# Patient Record
Sex: Female | Born: 1960
Health system: Southern US, Community
[De-identification: ages and names within clinical notes are randomized; demographics above are authoritative.]

## PROBLEM LIST (undated history)

## (undated) ENCOUNTER — Ambulatory Visit (HOSPITAL_COMMUNITY): Admission: EM | Payer: 59 | Source: Home / Self Care

## (undated) DIAGNOSIS — R519 Headache, unspecified: Secondary | ICD-10-CM

## (undated) DIAGNOSIS — E785 Hyperlipidemia, unspecified: Secondary | ICD-10-CM

## (undated) DIAGNOSIS — I10 Essential (primary) hypertension: Secondary | ICD-10-CM

## (undated) DIAGNOSIS — D649 Anemia, unspecified: Secondary | ICD-10-CM

## (undated) DIAGNOSIS — K219 Gastro-esophageal reflux disease without esophagitis: Secondary | ICD-10-CM

## (undated) DIAGNOSIS — R51 Headache: Secondary | ICD-10-CM

## (undated) DIAGNOSIS — M722 Plantar fascial fibromatosis: Secondary | ICD-10-CM

## (undated) DIAGNOSIS — M1990 Unspecified osteoarthritis, unspecified site: Secondary | ICD-10-CM

## (undated) DIAGNOSIS — J189 Pneumonia, unspecified organism: Secondary | ICD-10-CM

## (undated) DIAGNOSIS — K449 Diaphragmatic hernia without obstruction or gangrene: Secondary | ICD-10-CM

## (undated) DIAGNOSIS — F40240 Claustrophobia: Secondary | ICD-10-CM

## (undated) DIAGNOSIS — I639 Cerebral infarction, unspecified: Secondary | ICD-10-CM

## (undated) HISTORY — DX: Hyperlipidemia, unspecified: E78.5

## (undated) HISTORY — DX: Unspecified osteoarthritis, unspecified site: M19.90

## (undated) HISTORY — PX: COLONOSCOPY: SHX174

## (undated) HISTORY — DX: Anemia, unspecified: D64.9

---

## 1986-06-26 HISTORY — PX: TUBAL LIGATION: SHX77

## 1997-10-01 ENCOUNTER — Emergency Department (HOSPITAL_COMMUNITY): Admission: EM | Admit: 1997-10-01 | Discharge: 1997-10-01 | Payer: Self-pay | Admitting: Emergency Medicine

## 1997-12-04 ENCOUNTER — Emergency Department (HOSPITAL_COMMUNITY): Admission: EM | Admit: 1997-12-04 | Discharge: 1997-12-04 | Payer: Self-pay | Admitting: Emergency Medicine

## 1999-05-31 ENCOUNTER — Emergency Department (HOSPITAL_COMMUNITY): Admission: EM | Admit: 1999-05-31 | Discharge: 1999-05-31 | Payer: Self-pay | Admitting: Emergency Medicine

## 1999-05-31 ENCOUNTER — Encounter: Payer: Self-pay | Admitting: Emergency Medicine

## 1999-07-18 ENCOUNTER — Emergency Department (HOSPITAL_COMMUNITY): Admission: EM | Admit: 1999-07-18 | Discharge: 1999-07-18 | Payer: Self-pay | Admitting: Emergency Medicine

## 2000-02-23 ENCOUNTER — Encounter: Admission: RE | Admit: 2000-02-23 | Discharge: 2000-02-23 | Payer: Self-pay | Admitting: Hematology and Oncology

## 2000-11-08 ENCOUNTER — Encounter: Admission: RE | Admit: 2000-11-08 | Discharge: 2000-11-08 | Payer: Self-pay | Admitting: Hematology and Oncology

## 2001-01-09 ENCOUNTER — Encounter: Admission: RE | Admit: 2001-01-09 | Discharge: 2001-01-09 | Payer: Self-pay | Admitting: Internal Medicine

## 2001-01-16 ENCOUNTER — Ambulatory Visit (HOSPITAL_COMMUNITY): Admission: RE | Admit: 2001-01-16 | Discharge: 2001-01-16 | Payer: Self-pay

## 2001-05-04 ENCOUNTER — Emergency Department (HOSPITAL_COMMUNITY): Admission: EM | Admit: 2001-05-04 | Discharge: 2001-05-04 | Payer: Self-pay

## 2002-01-17 ENCOUNTER — Inpatient Hospital Stay (HOSPITAL_COMMUNITY): Admission: AD | Admit: 2002-01-17 | Discharge: 2002-01-17 | Payer: Self-pay | Admitting: *Deleted

## 2002-01-17 ENCOUNTER — Encounter: Payer: Self-pay | Admitting: *Deleted

## 2002-01-27 ENCOUNTER — Emergency Department (HOSPITAL_COMMUNITY): Admission: EM | Admit: 2002-01-27 | Discharge: 2002-01-27 | Payer: Self-pay | Admitting: Emergency Medicine

## 2002-05-20 ENCOUNTER — Encounter: Admission: RE | Admit: 2002-05-20 | Discharge: 2002-05-20 | Payer: Self-pay | Admitting: Occupational Medicine

## 2002-05-20 ENCOUNTER — Encounter: Payer: Self-pay | Admitting: Occupational Medicine

## 2002-05-27 ENCOUNTER — Encounter: Admission: RE | Admit: 2002-05-27 | Discharge: 2002-06-09 | Payer: Self-pay | Admitting: Occupational Medicine

## 2003-07-21 ENCOUNTER — Emergency Department (HOSPITAL_COMMUNITY): Admission: EM | Admit: 2003-07-21 | Discharge: 2003-07-21 | Payer: Self-pay | Admitting: Emergency Medicine

## 2003-11-13 ENCOUNTER — Emergency Department (HOSPITAL_COMMUNITY): Admission: EM | Admit: 2003-11-13 | Discharge: 2003-11-13 | Payer: Self-pay | Admitting: Emergency Medicine

## 2003-12-27 ENCOUNTER — Inpatient Hospital Stay (HOSPITAL_COMMUNITY): Admission: AD | Admit: 2003-12-27 | Discharge: 2003-12-27 | Payer: Self-pay | Admitting: Obstetrics & Gynecology

## 2004-01-05 ENCOUNTER — Encounter: Admission: RE | Admit: 2004-01-05 | Discharge: 2004-01-05 | Payer: Self-pay | Admitting: Obstetrics and Gynecology

## 2004-02-27 ENCOUNTER — Emergency Department (HOSPITAL_COMMUNITY): Admission: EM | Admit: 2004-02-27 | Discharge: 2004-02-27 | Payer: Self-pay | Admitting: Emergency Medicine

## 2004-03-07 ENCOUNTER — Emergency Department (HOSPITAL_COMMUNITY): Admission: EM | Admit: 2004-03-07 | Discharge: 2004-03-07 | Payer: Self-pay | Admitting: Emergency Medicine

## 2004-11-05 ENCOUNTER — Inpatient Hospital Stay (HOSPITAL_COMMUNITY): Admission: AD | Admit: 2004-11-05 | Discharge: 2004-11-05 | Payer: Self-pay | Admitting: Obstetrics and Gynecology

## 2005-03-30 ENCOUNTER — Emergency Department (HOSPITAL_COMMUNITY): Admission: EM | Admit: 2005-03-30 | Discharge: 2005-03-30 | Payer: Self-pay | Admitting: Emergency Medicine

## 2006-07-14 ENCOUNTER — Emergency Department (HOSPITAL_COMMUNITY): Admission: EM | Admit: 2006-07-14 | Discharge: 2006-07-14 | Payer: Self-pay | Admitting: Emergency Medicine

## 2006-10-01 ENCOUNTER — Emergency Department (HOSPITAL_COMMUNITY): Admission: EM | Admit: 2006-10-01 | Discharge: 2006-10-01 | Payer: Self-pay | Admitting: *Deleted

## 2006-10-02 ENCOUNTER — Ambulatory Visit: Payer: Self-pay | Admitting: *Deleted

## 2006-10-29 ENCOUNTER — Emergency Department (HOSPITAL_COMMUNITY): Admission: EM | Admit: 2006-10-29 | Discharge: 2006-10-29 | Payer: Self-pay | Admitting: Emergency Medicine

## 2007-04-18 ENCOUNTER — Encounter (INDEPENDENT_AMBULATORY_CARE_PROVIDER_SITE_OTHER): Payer: Self-pay | Admitting: Internal Medicine

## 2007-04-18 ENCOUNTER — Observation Stay (HOSPITAL_COMMUNITY): Admission: EM | Admit: 2007-04-18 | Discharge: 2007-04-20 | Payer: Self-pay | Admitting: Emergency Medicine

## 2007-04-18 ENCOUNTER — Ambulatory Visit: Payer: Self-pay | Admitting: Internal Medicine

## 2007-05-28 ENCOUNTER — Ambulatory Visit: Payer: Self-pay | Admitting: Internal Medicine

## 2007-05-28 DIAGNOSIS — F17200 Nicotine dependence, unspecified, uncomplicated: Secondary | ICD-10-CM | POA: Insufficient documentation

## 2007-05-28 DIAGNOSIS — E785 Hyperlipidemia, unspecified: Secondary | ICD-10-CM | POA: Insufficient documentation

## 2007-05-28 DIAGNOSIS — R079 Chest pain, unspecified: Secondary | ICD-10-CM | POA: Insufficient documentation

## 2007-05-28 DIAGNOSIS — K219 Gastro-esophageal reflux disease without esophagitis: Secondary | ICD-10-CM | POA: Insufficient documentation

## 2007-06-21 LAB — HM MAMMOGRAPHY: HM Mammogram: NORMAL

## 2007-06-24 ENCOUNTER — Ambulatory Visit (HOSPITAL_COMMUNITY): Admission: RE | Admit: 2007-06-24 | Discharge: 2007-06-24 | Payer: Self-pay | Admitting: Internal Medicine

## 2007-08-07 ENCOUNTER — Telehealth: Payer: Self-pay | Admitting: *Deleted

## 2007-10-15 ENCOUNTER — Ambulatory Visit: Payer: Self-pay | Admitting: Internal Medicine

## 2007-10-17 ENCOUNTER — Ambulatory Visit: Payer: Self-pay | Admitting: Infectious Disease

## 2008-01-07 ENCOUNTER — Inpatient Hospital Stay (HOSPITAL_COMMUNITY): Admission: AD | Admit: 2008-01-07 | Discharge: 2008-01-07 | Payer: Self-pay | Admitting: Gynecology

## 2008-01-30 ENCOUNTER — Ambulatory Visit: Payer: Self-pay | Admitting: Obstetrics and Gynecology

## 2008-01-30 ENCOUNTER — Encounter: Payer: Self-pay | Admitting: Obstetrics and Gynecology

## 2008-02-18 ENCOUNTER — Emergency Department (HOSPITAL_COMMUNITY): Admission: EM | Admit: 2008-02-18 | Discharge: 2008-02-18 | Payer: Self-pay | Admitting: Emergency Medicine

## 2008-02-18 ENCOUNTER — Emergency Department (HOSPITAL_COMMUNITY): Admission: EM | Admit: 2008-02-18 | Discharge: 2008-02-18 | Payer: Self-pay | Admitting: Family Medicine

## 2008-04-16 ENCOUNTER — Encounter: Payer: Self-pay | Admitting: Obstetrics and Gynecology

## 2008-04-16 ENCOUNTER — Ambulatory Visit: Payer: Self-pay | Admitting: Obstetrics & Gynecology

## 2008-04-16 ENCOUNTER — Other Ambulatory Visit: Admission: RE | Admit: 2008-04-16 | Discharge: 2008-04-16 | Payer: Self-pay | Admitting: Obstetrics & Gynecology

## 2008-04-16 LAB — CONVERTED CEMR LAB
MCHC: 30.5 g/dL (ref 30.0–36.0)
Prolactin: 7.1 ng/mL
RDW: 14.1 % (ref 11.5–15.5)
TSH: 0.597 microintl units/mL (ref 0.350–4.50)

## 2008-04-30 ENCOUNTER — Ambulatory Visit: Payer: Self-pay | Admitting: Obstetrics and Gynecology

## 2008-05-15 ENCOUNTER — Inpatient Hospital Stay (HOSPITAL_COMMUNITY): Admission: AD | Admit: 2008-05-15 | Discharge: 2008-05-15 | Payer: Self-pay | Admitting: Family Medicine

## 2008-05-26 HISTORY — PX: ABDOMINAL HYSTERECTOMY: SHX81

## 2008-06-08 ENCOUNTER — Encounter: Payer: Self-pay | Admitting: Obstetrics & Gynecology

## 2008-06-08 ENCOUNTER — Inpatient Hospital Stay (HOSPITAL_COMMUNITY): Admission: RE | Admit: 2008-06-08 | Discharge: 2008-06-10 | Payer: Self-pay | Admitting: Obstetrics & Gynecology

## 2008-06-08 ENCOUNTER — Ambulatory Visit: Payer: Self-pay | Admitting: Obstetrics & Gynecology

## 2008-06-12 ENCOUNTER — Inpatient Hospital Stay (HOSPITAL_COMMUNITY): Admission: AD | Admit: 2008-06-12 | Discharge: 2008-06-12 | Payer: Self-pay | Admitting: Family Medicine

## 2008-06-14 ENCOUNTER — Ambulatory Visit: Payer: Self-pay | Admitting: Obstetrics & Gynecology

## 2008-06-14 ENCOUNTER — Inpatient Hospital Stay (HOSPITAL_COMMUNITY): Admission: AD | Admit: 2008-06-14 | Discharge: 2008-06-14 | Payer: Self-pay | Admitting: Obstetrics & Gynecology

## 2008-07-02 ENCOUNTER — Ambulatory Visit: Payer: Self-pay | Admitting: Obstetrics and Gynecology

## 2008-07-23 ENCOUNTER — Ambulatory Visit: Payer: Self-pay | Admitting: Family Medicine

## 2008-09-01 ENCOUNTER — Ambulatory Visit: Payer: Self-pay | Admitting: Internal Medicine

## 2008-09-01 ENCOUNTER — Encounter (INDEPENDENT_AMBULATORY_CARE_PROVIDER_SITE_OTHER): Payer: Self-pay | Admitting: Internal Medicine

## 2009-06-06 ENCOUNTER — Emergency Department (HOSPITAL_COMMUNITY): Admission: EM | Admit: 2009-06-06 | Discharge: 2009-06-06 | Payer: Self-pay | Admitting: Emergency Medicine

## 2009-07-30 ENCOUNTER — Ambulatory Visit: Payer: Self-pay | Admitting: Internal Medicine

## 2009-07-31 ENCOUNTER — Emergency Department (HOSPITAL_COMMUNITY): Admission: EM | Admit: 2009-07-31 | Discharge: 2009-07-31 | Payer: Self-pay | Admitting: Emergency Medicine

## 2009-08-04 LAB — CONVERTED CEMR LAB
Barbiturate Quant, Ur: NEGATIVE
Creatinine,U: 118.9 mg/dL
Methadone: NEGATIVE
Opiates: POSITIVE — AB
Propoxyphene: NEGATIVE

## 2009-11-30 ENCOUNTER — Ambulatory Visit (HOSPITAL_COMMUNITY): Admission: RE | Admit: 2009-11-30 | Discharge: 2009-11-30 | Payer: Self-pay | Admitting: Internal Medicine

## 2009-11-30 ENCOUNTER — Ambulatory Visit: Payer: Self-pay | Admitting: Internal Medicine

## 2009-12-06 ENCOUNTER — Emergency Department (HOSPITAL_COMMUNITY): Admission: EM | Admit: 2009-12-06 | Discharge: 2009-12-06 | Payer: Self-pay | Admitting: Emergency Medicine

## 2009-12-17 ENCOUNTER — Emergency Department (HOSPITAL_COMMUNITY): Admission: EM | Admit: 2009-12-17 | Discharge: 2009-12-17 | Payer: Self-pay | Admitting: Emergency Medicine

## 2010-01-10 ENCOUNTER — Ambulatory Visit (HOSPITAL_COMMUNITY): Admission: RE | Admit: 2010-01-10 | Discharge: 2010-01-10 | Payer: Self-pay | Admitting: Internal Medicine

## 2010-01-10 ENCOUNTER — Ambulatory Visit: Payer: Self-pay | Admitting: Internal Medicine

## 2010-01-10 ENCOUNTER — Encounter: Payer: Self-pay | Admitting: Internal Medicine

## 2010-01-10 ENCOUNTER — Telehealth (INDEPENDENT_AMBULATORY_CARE_PROVIDER_SITE_OTHER): Payer: Self-pay | Admitting: *Deleted

## 2010-01-10 DIAGNOSIS — R209 Unspecified disturbances of skin sensation: Secondary | ICD-10-CM | POA: Insufficient documentation

## 2010-01-10 DIAGNOSIS — R7309 Other abnormal glucose: Secondary | ICD-10-CM | POA: Insufficient documentation

## 2010-01-11 ENCOUNTER — Encounter: Payer: Self-pay | Admitting: Internal Medicine

## 2010-01-11 ENCOUNTER — Telehealth: Payer: Self-pay | Admitting: *Deleted

## 2010-01-11 ENCOUNTER — Telehealth: Payer: Self-pay | Admitting: Licensed Clinical Social Worker

## 2010-01-11 DIAGNOSIS — D649 Anemia, unspecified: Secondary | ICD-10-CM

## 2010-01-11 DIAGNOSIS — D509 Iron deficiency anemia, unspecified: Secondary | ICD-10-CM | POA: Insufficient documentation

## 2010-01-11 LAB — CONVERTED CEMR LAB
ALT: 24 units/L (ref 0–35)
AST: 20 units/L (ref 0–37)
Alkaline Phosphatase: 55 units/L (ref 39–117)
CO2: 24 meq/L (ref 19–32)
Creatinine, Ser: 0.68 mg/dL (ref 0.40–1.20)
HCT: 33.8 % — ABNORMAL LOW (ref 36.0–46.0)
MCV: 74 fL — ABNORMAL LOW (ref >=78.0)
Platelets: 272 10*3/uL (ref 150–400)
Total Bilirubin: 0.2 mg/dL — ABNORMAL LOW (ref 0.3–1.2)
Total CHOL/HDL Ratio: 4.1
WBC: 5.3 10*3/uL (ref 4.0–10.5)

## 2010-01-12 ENCOUNTER — Encounter: Payer: Self-pay | Admitting: Licensed Clinical Social Worker

## 2010-01-24 ENCOUNTER — Ambulatory Visit: Payer: Self-pay | Admitting: Internal Medicine

## 2010-01-24 DIAGNOSIS — I10 Essential (primary) hypertension: Secondary | ICD-10-CM | POA: Insufficient documentation

## 2010-01-24 DIAGNOSIS — J45901 Unspecified asthma with (acute) exacerbation: Secondary | ICD-10-CM | POA: Insufficient documentation

## 2010-02-01 ENCOUNTER — Telehealth (INDEPENDENT_AMBULATORY_CARE_PROVIDER_SITE_OTHER): Payer: Self-pay | Admitting: *Deleted

## 2010-02-04 ENCOUNTER — Encounter: Payer: Self-pay | Admitting: Internal Medicine

## 2010-02-04 ENCOUNTER — Ambulatory Visit: Payer: Self-pay | Admitting: Internal Medicine

## 2010-02-04 DIAGNOSIS — K921 Melena: Secondary | ICD-10-CM | POA: Insufficient documentation

## 2010-02-04 LAB — CONVERTED CEMR LAB
OCCULT 1: NEGATIVE
OCCULT 2: POSITIVE

## 2010-02-15 ENCOUNTER — Telehealth: Payer: Self-pay | Admitting: Internal Medicine

## 2010-02-16 ENCOUNTER — Telehealth: Payer: Self-pay | Admitting: Internal Medicine

## 2010-05-09 ENCOUNTER — Ambulatory Visit: Payer: Self-pay | Admitting: Internal Medicine

## 2010-07-28 NOTE — Progress Notes (Signed)
Summary: Soc. 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 Work Call placed to: Patient Summary of Call: Called patient re: smoking cessation counseling and she will come in tomorrow for TB test reading and also to see me for smoking cessation.

## 2010-07-28 NOTE — Assessment & Plan Note (Signed)
Summary: SHE THINK HAS COLD. SB   Vital Signs:  Patient profile:   50 year old female Height:      59 inches Weight:      141.0 pounds BMI:     28.58 Temp:     97.2 degrees F oral Pulse rate:   69 / minute BP sitting:   128 / 85  (right arm)  Vitals Entered By: Filomena Jungling NT II (November 30, 2009 8:35 AM) CC:  tooth pain, ?bronchitis Is Patient Diabetic? No Pain Assessment Patient in pain? yes     Location: teeth Intensity: 8 Type: aching Onset of pain  Intermittent Nutritional Status BMI of 25 - 29 = overweight  Have you ever been in a relationship where you felt threatened, hurt or afraid?No   Does patient need assistance? Functional Status Self care Ambulation Normal   Primary Care Provider:  Peggye Pitt, MD  CC:   tooth pain and ?bronchitis.  History of Present Illness: [name] is a 50 year old Female with PMH/problems as outlined in the EMR, who presents to the [facility] with chief complaint(s) of:    1. Cough: having cough and wheezing, this is going on for two to three weeks. No fever. Brings up mucoid phlegm, started out being little yellowish and now it is white. No sick contact. No pain, did have some burning in the chest. Tried some over the counter stuffs that did not help. Cough is worse during night. But heat makes it worse. Did have asthma during childhood, sometimes feels like asthma.   Preventive Screening-Counseling & Management  Alcohol-Tobacco     Alcohol type: AT TIMES / BEER     Smoking Status: current     Year Started: AT THE AGE OF 19  Caffeine-Diet-Exercise     Does Patient Exercise: yes     Type of exercise: WALKING     Times/week: 3  Current Medications (verified): 1)  Gnp Omeprazole 20 Mg  Tbec (Omeprazole) .... Take 1 Tablet By Mouth Once A Day 2)  Pravastatin Sodium 40 Mg  Tabs (Pravastatin Sodium) .... Take 1 Tablet By Mouth Once A Day 3)  Vicodin 5-500 Mg Tabs (Hydrocodone-Acetaminophen) .... Take 1 Tablet By Mouth Two Times  A Day 4)  Peridex 0.12 % Soln (Chlorhexidine Gluconate) .Marland KitchenMarland KitchenMarland Kitchen 15ml Swish and Spit Two Times A Day 5)  Ventolin Hfa 108 (90 Base) Mcg/act Aers (Albuterol Sulfate) .... Use Two Puffs Four Times A Day As Needed For Wheezing.  Allergies (verified): No Known Drug Allergies  Past History:  Past Medical History: Last updated: 05/28/2007 Hyperlipidemia  Risk Factors: Exercise: yes (11/30/2009)  Risk Factors: Smoking Status: current (11/30/2009)  Review of Systems      See HPI  Physical Exam  General:  alert and well-developed.   Head:  normocephalic and atraumatic.   Eyes:  vision grossly intact, pupils equal, and pupils round.   Ears:  no external deformities.   Nose:  no external deformity.   Mouth:  pharynx pink and moist and no erythema.   Neck:  supple.   Lungs:  normal respiratory effort and normal breath sounds.   Heart:  normal rate and regular rhythm.   Abdomen:  soft and non-tender.   Pulses:  normal peripheral pulses  Extremities:  no cyanosis, clubbing or edema  Neurologic:  non focal.  Psych:  normally interactive.     Impression & Recommendations:  Problem # 1:  COUGH (ICD-786.2) Differential incl seasonal allergy, asthma, GERD, postnasal  drip, smoker's cough. I will get a chest x-ray to look for anything wrong in the lungs eg. malignancy given h/o smoking. Will give her albuterol inhaler to be used as needed.   Orders: CXR- 2view (CXR)  Problem # 2:  TOBACCO ABUSE (ICD-305.1) Counselled.   Complete Medication List: 1)  Gnp Omeprazole 20 Mg Tbec (Omeprazole) .... Take 1 tablet by mouth once a day 2)  Pravastatin Sodium 40 Mg Tabs (Pravastatin sodium) .... Take 1 tablet by mouth once a day 3)  Vicodin 5-500 Mg Tabs (Hydrocodone-acetaminophen) .... Take 1 tablet by mouth two times a day 4)  Peridex 0.12 % Soln (Chlorhexidine gluconate) .Marland KitchenMarland KitchenMarland Kitchen 15ml swish and spit two times a day 5)  Ventolin Hfa 108 (90 Base) Mcg/act Aers (Albuterol sulfate) .... Use two puffs  four times a day as needed for wheezing.  Patient Instructions: 1)  Please schedule a follow-up appointment in 1 month. 2)  We will let you know if anything wrong with your lab work.   3)  Please let us know or come to ED if breathing gets worse.  Prescriptions: VENTOLIN HFA 108 (90 BASE) MCG/ACT AERS (ALBUTEROL SULFATE) Use two puffs four times a day as needed for wheezing.  #1 x 1   Entered and Authorized by:   Zara Council MD   Signed by:   Zara Council MD on 11/30/2009   Method used:   Print then Give to Patient   RxID:   [id number]   Prevention & Chronic Care Immunizations   Influenza vaccine: Not documented   Influenza vaccine deferral: Deferred  (11/30/2009)    Tetanus booster: Not documented   Td booster deferral: Deferred  (11/30/2009)    Pneumococcal vaccine: Not documented  Other Screening   Pap smear: Not documented    Mammogram: normal  (06/21/2007)   Mammogram due: 06/2008   Smoking status: current  (11/30/2009)  Lipids   Total Cholesterol: Not documented   LDL: Not documented   LDL Direct: Not documented   HDL: Not documented   Triglycerides: Not documented    SGOT (AST): Not documented   SGPT (ALT): Not documented   Alkaline phosphatase: Not documented   Total bilirubin: Not documented  Self-Management Support :    Patient will work on the following items until the next clinic visit to reach self-care goals:     Eating: eat more vegetables, use fresh or frozen vegetables, eat foods that are low in salt, eat baked foods instead of fried foods, eat fruit for snacks and desserts  (11/30/2009)    Lipid self-management support: Education handout, Resources for patients handout  (11/30/2009)     Lipid education handout printed      Resource handout printed.

## 2010-07-28 NOTE — Progress Notes (Signed)
Summary: phone/gg  Phone Note Call from Patient   Caller: Patient Summary of Call: Pt called with c/o  tingling on left side of neck,  arm , shoulder and leg.  onset of complete left side is today,   this tingling iis constant.  She was been seen here for tingling in arm and told it was a nerve impingment.  Today left  palm is  sweaty and this concerns her. She feels something is wrong with her.  She had nausea and headache this am.   Pt # [phone] Initial call taken by: Merrie Roof RN,  February 01, 2010 9:22 AM  Follow-up for Phone Call        Pt advised to be evaluated in ED as tingling on complete left side is new per Dr.Woodyear  Merrie Roof RN  February 01, 2010 10:06 AM  Follow-up by: Zoila Shutter MD,  February 01, 2010 9:46 AM

## 2010-07-28 NOTE — Progress Notes (Signed)
Summary: phone note/gp  Phone Note Call from Patient   Caller: Patient Summary of Call: Pt states she cannot afford Omeprazole 20mg - costs $120.00; pt. instructed to buy OTC.  Also instructed to call back if she has any other problems. Initial call taken by: Chinita Pester RN,  January 11, 2010 11:03 AM     Appended Document: phone note/gp I asked the patient to go to [facility] for $15 for 90 days supply.

## 2010-07-28 NOTE — Progress Notes (Signed)
Summary: Stiffness in neck  Phone Note Call from Patient   Caller: Patient Call For: Danelle Berry MD Summary of Call: Call from pt says she is coming for a TB Skin test at 10 am.  Pt says that she is having some tingling in her fingers and left arm.  Says that she wants her B/P check.   Stiffness in neck per pt.   No other symptoms per pt. Pt given an appointment for this am.Gladys Herbin RN  January 10, 2010 9:06 AM  Initial call taken by: Angelina Ok RN,  January 10, 2010 9:06 AM  Follow-up for Phone Call        Agree. Follow-up by: Zoila Shutter MD,  January 10, 2010 9:59 AM

## 2010-07-28 NOTE — Progress Notes (Signed)
Summary: tramadol/ hla  Phone Note Call from Patient   Summary of Call: pt calls and states she is afraid to take the tramadol, it has too many side effects, she states she wants something else, states she has taken hydrocodone in the past but it does not have to be hydrocodone, she just feels tramadol is unsafe and states she is flushing it, she is asked not to destroy it by flushing. please advise. pt's ph# [phone] Initial call taken by: Marin Roberts RN,  February 16, 2010 11:48 AM  Follow-up for Phone Call        I phoned and spoke with patient about this issue.  She stated that she is "afraid of all the side effects of the tramadol."  She said "you all at the clinic are experimenting on me, you do not know what you are doing and you keep giving me medicine to poison me."  She said that she has taken hydrocodone in the past and "though I don't necessarily need hydrocodone, you need to give me something for pain.  If you won't I will go somewhere else that will."  I explained to the patient that it would be safe for her to take tramadol as instructed and tried to get her to elaborate on the side effects she has had on the medicine. She stated "it makes me feel jittery.  I won't take this medicine."  I explained to her that we can make an appointment for her to be seen in clinic.  She declined, repeating that "you are experimenting on me."  I offered another appointment for a complete evaluation and offerred to prescribe tylenol, ibuprofen or naproxen for her pain in the meantime, and to this she yelled at me "what! are you trying to kill me?  poison me?  you know I am allergic to aspirin, and you are trying to give me asprin!."  I tried to explain to her that none of those medicines contain aspirin, but she did not allow me to speak. I tried offering an appointment but she refused saying "I think I will go see another doctor." I told her that because she has no insurance, it may be difficult to receive  prompt care elsewhere and that we would be happy to see her here.  She refused my offer and hung up on me.  Given the patient's recent history of UDS+ for cocaine, I did not offer any narcotic pain medicine. Follow-up by: Danelle Berry MD

## 2010-07-28 NOTE — Progress Notes (Signed)
Summary: call from pt//kg  Phone Note Call from Patient Call back at [phone]   Caller: Patient Call For: Ruth Berry MD Complaint: Nausea/Vomiting/Diarrhea Summary of Call: Call from [name].  Pt was reminded of pending referral to GI that's on hold until she completes paperwork to [facility].  Pt states she is having a hard time obtaining her "tax information".  She stated that someone was supposed to be mailing them to her, but she has yet to receive it.  She will try again to obtain the information.  Pt also instructed that she is suppose to be taking iron 325mg  three times a day for her anemia.  Pt states she already has iron at home, she will check the dose take as prescribed.  Pt also states the "best" number to reach her at is [phone].. Will forward message to Dr Francene Castle [facility])  February 15, 2010 2:22 PM   Follow-up for Phone Call        Phoned patient and explained that she needs to bring in her paperwork to get the orange card so that we can complete the GI referral for colonoscopy.  she states that "we don't understand that every patient is different" and that if she "cannot get the paperwork i will just pay out of pocket with a payment plan because it is hard for me to get the papers in."  I stressed to her that if she is able to get the paperwork in she can avoid paying the over $2000 it would cost to have a colonoscopy.  She voiced understanding and said she be getting tax forms tomorrow.  I advised her to call Ms. Hill to be sure that she has all her paperwork when she comes back to clinic so taht she does not have to make multiple trips.    The conversation degraded when I addressed her next concern.  Please see the response to the next phone note for further information. Follow-up by: Ruth Berry MD

## 2010-07-28 NOTE — Assessment & Plan Note (Signed)
Summary: BP CHECK/CFB   Vital Signs:  Patient profile:   50 year old female Height:      59 inches (149.86 cm) Weight:      143.6 pounds (65.27 kg) BMI:     29.11 Temp:     143.6 degrees F (62.00 degrees C) oral Pulse rate:   76 / minute BP sitting:   153 / 97  (right arm) Cuff size:   regular  Vitals Entered By: Cynda Familia Duncan Dull) (January 10, 2010 9:58 AM) CC: pt c/o left side neck stiffness and tingling/numbness in left hand/fingers started yesterday, also needs tb skin test for new job Is Patient Diabetic? No Pain Assessment Patient in pain? yes     Location: left hand Intensity: 5 Type: tingling Onset of pain  constant started yesterday Nutritional Status BMI of > 30 = obese  Have you ever been in a relationship where you felt threatened, hurt or afraid?No   Does patient need assistance? Functional Status Self care Ambulation Normal   Primary Care Provider:  Danelle Berry, MD  CC:  pt c/o left side neck stiffness and tingling/numbness in left hand/fingers started yesterday and also needs tb skin test for new job.  History of Present Illness: Patient is a 50 year old womenen with PMH as described in EMR is here today with:  Neck pain and tigling in her left arm going on for a long time but getting a little worse since last few days. patient had an MRI done in 2008 at the time of admission for chest pain which showed some degenerative disc disease. She hasnt been complaining of any weakness or gait abnormality.  Wants to stop smoking. I will send her to [facility] for [facility]selling.  BP is elevated today and repeat BP is also 160/94. She has family hostory of Diabetes and HTN and wants to start meds for this.  Want to lose weight and asked me about different options.  She ahs reflux symptoms and cannot afford nexium started by one of the other doc. I will star her on Omeprazol.   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 Take 1 Tablet By Mouth Once A Day 2)  Pravastatin Sodium 40 Mg  Tabs (Pravastatin Sodium) .... Take 1 Tablet By Mouth Once A Day 3)  Vicodin 5-500 Mg Tabs (Hydrocodone-Acetaminophen) .... Take 1 Tablet By Mouth Two Times A Day 4)  Peridex 0.12 % Soln (Chlorhexidine Gluconate) .Marland KitchenMarland KitchenMarland Kitchen 15ml Swish and Spit Two Times A Day 5)  Ventolin Hfa 108 (90 Base) Mcg/act Aers (Albuterol Sulfate) .... Use Two Puffs Four Times A Day As Needed For Wheezing.  Current Medications (verified): 1)  Pravastatin Sodium 40 Mg  Tabs (Pravastatin Sodium) .... Take 1 Tablet By Mouth Once A Day 2)  Ventolin Hfa 108 (90 Base) Mcg/act Aers (Albuterol Sulfate) .... Use Two Puffs Four Times A Day As Needed For Wheezing. 3)  Lisinopril 10 Mg Tabs (Lisinopril) .... Take 1 Tablet By Mouth Once A Day 4)  Omeprazole 20 Mg Cpdr (Omeprazole) .... Take 1 Tablet By Mouth  Once A Day  Allergies (verified): 1)  ! Asa 2)  ! Dilaudid (Hydromorphone Hcl)  Past History:  Past Medical History: Last updated: 05/28/2007 Hyperlipidemia  Risk Factors: Exercise: yes (11/30/2009)  Risk Factors: Smoking Status: current (11/30/2009)  Review of Systems      See HPI  Physical Exam  Additional Exam:  Gen: AOx3, in no acute distress Eyes: PERRL, EOMI ENT:MMM, No erythema noted in posterior pharynx Neck: No JVD, No LAP Chest: CTAB with  good respiratory effort CVS: regular rhythmic rate, NO M/R/G, S1 S2 normal Abdo: soft,ND, BS+x4, Non tender and No hepatosplenomegaly EXT: No odema noted Neuro: Non focal, gait is normal Skin: no rashes noted.     Impression & Recommendations:  Problem # 1:  TINGLING (ICD-782.0) Assessment Comment Only Patient was complaining of tigling in her left arm and also c/o of some chest pain. Given risk factors in her i did an EKG which was normal. She has also had an MRI of hre enck in the apst in 2008 which was s/o DJD. I will get an Xray to start with and may be we can repeat an MRI but in the absence of danger signs like gait abnormality, weakness in lower limbs we may refer her to PT/OT which might benefit and may relieve symptoms. Orders: Diagnostic X-Ray/Fluoroscopy (Diagnostic X-Ray/Flu) 12 Lead EKG (12 Lead EKG)  Problem # 2:  HYPERGLYCEMIA (ICD-790.29) Assessment: Comment Only I will check Hba1c to rule out DM. Orders: T-Hgb A1C (in-house) ([id number])  Problem # 3:  COUGH (ICD-786.2) Assessment: Improved Improved.  Problem # 4:  TOBACCO ABUSE (ICD-305.1) Assessment: Unchanged Patient was counseled on smoking cessation strategies including medications and behavior modification options.  She will be referred to [facility] for appropriate counselling.  Orders: T-CMP with Estimated GFR ([id number]) Social Work Referral (Social )  Encouraged smoking cessation and discussed different methods for smoking cessation.   Problem # 5:  HYPERLIPIDEMIA (ICD-272.4) Will; check lipid profile.  Her updated medication list for this problem includes:    Pravastatin Sodium 40 Mg Tabs (Pravastatin sodium) .Marland Kitchen... Take 1 tablet by mouth once a day  Orders: T-Lipid Profile ([id number])  Problem # 6:  GERD (ICD-530.81) changed to Omeprazole in [facility] which is $15 for 90 day supply. The following medications were removed from the medication list:    Gnp Omeprazole 20 Mg Tbec (Omeprazole) .Marland Kitchen... Take 1 tablet by mouth once a day Her updated medication list for this problem includes:    Omeprazole 20 Mg Cpdr (Omeprazole) .Marland Kitchen... Take 1 tablet by mouth once a day  Labs Reviewed: Hgb: 9.9 (04/16/2008)    Hct: 32.5 (04/16/2008)  Problem # 7:  CHEST PAIN (ICD-786.50) Assessment: Comment Only EKG done in the clinic is negative for ischemic changes.  Problem # 8:  Preventive Health Care (ICD-V70.0) Assessment: Comment Only TB test and CBC done for routine check and pre employment workup.  Complete Medication List: 1)  Pravastatin Sodium 40 Mg Tabs (Pravastatin sodium) .... Take 1 tablet by mouth once a day 2)  Ventolin Hfa 108 (90 Base) Mcg/act Aers (Albuterol sulfate) .... Use two puffs four times a day as needed for wheezing. 3)  Lisinopril 10 Mg Tabs (Lisinopril) .... Take 1 tablet by mouth once a day 4)  Omeprazole 20 Mg Cpdr (Omeprazole) .... Take 1 tablet by mouth once a day  Other Orders: T-CBC No Diff ([id number]) TB Skin Test (60630) Admin 1st Vaccine (16010)  Patient Instructions: 1)  Please schedule a follow-up appointment in  2 weeks. 2)  Limit your Sodium (Salt). 3)  Tobacco is very bad for your health and your loved ones! You Should stop smoking!. 4)  Stop Smoking Tips: Choose a Quit date. Cut down before the Quit date. decide what you will do as a substitute when you feel the urge to smoke(gum,toothpick,exercise). 5)  It is important that you exercise regularly at least 20 minutes 5 times a week. If you develop chest pain, have severe difficulty breathing, or feel very tired , stop exercising immediately and seek medical attention. 6)  You need to lose weight. Consider a lower calorie diet and regular exercise.  7)  It is not healthy  for men to drink more than 2-3 drinks per day or for women to drink more than 1-2 drinks per day. 8)  Check your Blood Pressure regularly. If it is above:160/100 you should make an appointment. Prescriptions: PRAVASTATIN SODIUM 40 MG  TABS (PRAVASTATIN SODIUM) Take 1 tablet by mouth once a day  #31 x 3   Entered and Authorized by:   Lars Mage MD   Signed by:   Lars Mage MD on 01/10/2010   Method used:   Print then Give to Patient   RxID:    [id number] OMEPRAZOLE 20 MG CPDR (OMEPRAZOLE) Take 1 tablet by mouth once a day  #90 x 1   Entered and Authorized by:   Lars Mage MD   Signed by:   Lars Mage MD on 01/10/2010   Method used:   Print then Give to Patient   RxID:   [id number] LISINOPRIL 10 MG TABS (LISINOPRIL) Take 1 tablet by mouth once a day  #31 x 11   Entered and Authorized by:   Lars Mage MD   Signed by:   Lars Mage MD on 01/10/2010   Method used:   Print then Give to Patient   RxID:   [id number]  Process Orders Check Orders Results:     [facility]: ABN not required for this insurance Tests Sent for requisitioning (January 10, 2010 3:34 PM):     01/10/2010: [facility] -- T-CMP with Estimated GFR [[id number]] (signed)     01/10/2010: [facility] -- T-Lipid Profile [id number] (signed)     01/10/2010: [facility] -- T-CBC No Diff [[id number]] (signed)    Prevention & Chronic Care Immunizations   Influenza vaccine: Not documented   Influenza vaccine deferral: Deferred  (11/30/2009)    Tetanus booster: Not documented   Td booster deferral: Deferred  (11/30/2009)    Pneumococcal vaccine: Not documented  Other Screening   Pap smear: Not documented   Pap smear action/deferral: Not indicated S/P hysterectomy  (01/10/2010)    Mammogram: normal  (06/21/2007)   Mammogram action/deferral: Deferred to age 74  (01/10/2010)   Mammogram due: 06/2008   Smoking status: current  (11/30/2009)   Smoking cessation counseling: yes  (01/10/2010)  Lipids   Total Cholesterol: Not documented   LDL: Not documented   LDL Direct: Not documented   HDL: Not documented   Triglycerides: Not documented    SGOT (AST): Not documented   SGPT (ALT): Not documented   Alkaline phosphatase: Not documented   Total bilirubin: Not documented    Lipid flowsheet reviewed?: Yes   Progress toward LDL goal: At goal  Self-Management Support :    Personal Goals (by the next clinic visit) :      Personal LDL goal: 130  (01/10/2010)    Patient will work on the  following items until the next clinic visit to reach self-care goals:     Medications and monitoring: take my medicines every day  (01/10/2010)     Eating: eat foods that are low in salt, eat baked foods instead of fried foods  (01/10/2010)     Activity: take a 30 minute walk every day  (01/10/2010)    Lipid self-management support: Pre-printed educational material, Resources for patients handout, Written self-care plan  (01/10/2010)   Lipid self-care plan printed.      Resource handout printed.   Process Orders Check Orders Results:     [facility]: ABN not required for this insurance Tests Sent for requisitioning (January 10, 2010 3:34 PM):     01/10/2010: [facility] -- T-CMP with Estimated GFR [[id number]] (signed)     01/10/2010: [facility] -- T-Lipid Profile [phone] (signed)     01/10/2010: [facility] -- T-CBC No Diff [[id number]] (signed)    Immunizations Administered:  PPD Skin Test:    Vaccine Type: PPD    Site: left forearm    Mfr: Sanofi Pasteur    Dose: 0.1 ml    Route: ID    Given by: Cynda Familia (AAMA)    Exp. Date: 04/28/2011    Lot #: [id number]

## 2010-07-28 NOTE — Assessment & Plan Note (Signed)
Summary: ACUTE/WATSON/2 WEEK F/U /CH   Vital Signs:  Patient profile:   50 year old female Height:      59 inches (149.86 cm) Weight:      140.9 pounds (64.05 kg) BMI:     28.56 Temp:     97.0 degrees F (36.11 degrees C) oral Pulse rate:   76 / minute BP sitting:   150 / 80  (right arm)  Vitals Entered By: Filomena Jungling NT II (January 24, 2010 9:08 AM) CC: FO LL0W UP  BLOOD PRESSURE Is Patient Diabetic? No Pain Assessment Patient in pain? no      Nutritional Status BMI of 25 - 29 = overweight  Have you ever been in a relationship where you felt threatened, hurt or afraid?No   Does patient need assistance? Functional Status Self care Ambulation Normal   Primary Care Provider:  Danelle Berry, MD  CC:  FO LL0W UP  BLOOD PRESSURE.  History of Present Illness: Follow up appointment: 1. HTN-takes her meds as prescribed. 2.left hand tingling--worse when "sleeps on it." 3. Asthma --worse with humidity and heat;uses ventolin 4 times daily. Increased wheezing.  Preventive Screening-Counseling & Management  Alcohol-Tobacco     Alcohol type: AT TIMES / BEER     Smoking Status: current     Smoking Cessation Counseling: yes     Year Started: AT THE AGE OF 19  Caffeine-Diet-Exercise     Does Patient Exercise: yes     Type of exercise: WALKING     Times/week: 3      Drug Use:  no.    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) 8)  Tobacco Abuse  (ICD-305.1) 9)  Health Maintenance Exam  (ICD-V70.0) 10)  Hyperlipidemia  (ICD-272.4) 11)  Gerd  (ICD-530.81) 12)  Chest Pain  (ICD-786.50)  Current Problems (verified): 1)  Anemia  (ICD-285.9) 2)  Tingling  (ICD-782.0) 3)  Hyperglycemia  (ICD-790.29) 4)  Cough  (ICD-786.2) 5)  Dental  Pain  (ICD-525.9) 6)  Cocaine Abuse, 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 Take 1 Tablet By Mouth Once A Day 2)  Ventolin Hfa 108 (90 Base) Mcg/act Aers (Albuterol Sulfate) .... Use Two Puffs Four Times A Day As Needed For Wheezing. 3)  Lisinopril 10 Mg Tabs (Lisinopril) .... Take 1 Tablet By Mouth Once A Day 4)  Omeprazole 20 Mg Cpdr (Omeprazole) .... Take 1 Tablet By Mouth Once A Day  Current Medications (verified): 1)  Pravastatin Sodium 40 Mg  Tabs (Pravastatin Sodium) .... Take 1 Tablet By Mouth Once A Day 2)  Ventolin Hfa 108 (90 Base) Mcg/act Aers (Albuterol Sulfate) .... Use Two Puffs Four Times A Day As Needed For Wheezing. 3)  Lisinopril 10 Mg Tabs (Lisinopril) .... Take 1 Tablet By Mouth Once A Day 4)  Omeprazole 20 Mg Cpdr (Omeprazole) .... Take 1 Tablet By Mouth Once A Day  Allergies (verified): 1)  ! Asa 2)  ! Dilaudid (Hydromorphone Hcl)  Directives (verified): 1)  Full Code   Past History:  Risk Factors: Smoking Status: current (01/24/2010)  Past medical, surgical, family and social histories (including risk factors) reviewed for relevance to current acute and chronic problems.  Past Medical History: Reviewed history from 05/28/2007 and no changes required. Hyperlipidemia  Family History: Reviewed history and no changes required. m. aunt: Breast cancer at age20 y/o father: throat cancer at 79 y/o mother: pancreatic cancer at age 72 y/o  Social History: Reviewed history and no changes required. 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   Head:   Normocephalic and atraumatic without obvious abnormalities. No apparent alopecia or balding. Eyes:  No corneal or conjunctival inflammation noted. EOMI. Perrla. Funduscopic exam benign, without hemorrhages, exudates or papilledema. Vision grossly normal. Ears:  External ear exam shows no significant lesions or deformities.  Otoscopic examination reveals clear canals, tympanic membranes are intact bilaterally without bulging, retraction, inflammation or discharge. Hearing is grossly normal bilaterally. Nose:  no external deformity.   Mouth:  pharynx pink and moist and no erythema.   Neck:  supple.   Lungs:  normal respiratory effort and normal breath sounds.   Heart:  normal rate and regular rhythm.   Abdomen:  soft and non-tender.   Msk:  No deformity or scoliosis noted of thoracic or lumbar spine.   Pulses:  R and L carotid,radial,femoral,dorsalis pedis and posterior tibial pulses are full and equal bilaterally Extremities:  No clubbing, cyanosis, edema, or deformity noted with normal full range of motion of all joints.   Neurologic:  lefthand volar surface decreased sensationto light touch.alert & oriented X3, cranial nerves II-XII intact, strength normal in all extremities, DTRs symmetrical and normal, finger-to-nose normal, and heel-to-shin normal.   Skin:  Intact without suspicious lesions or rashes Cervical Nodes:  no anterior cervical adenopathy.   Axillary Nodes:  No palpable lymphadenopathy Inguinal Nodes:  No significant adenopathy Psych:  Cognition and judgment appear intact. Alert and cooperative with normal attention span and concentration. No apparent delusions, illusions, hallucinations   Impression & Recommendations:  Problem # 1:  COUGH (ICD-786.2) Asthma is not controlled. Will add Flovent. Instructedon use flow chamber and peak flow. Strongly advised to quit smoking.  Problem # 2:  TINGLING (ICD-782.0) Likely nerve impingment. Will try tramadol(patient states  thatOTcNaprosen, tylenol and Ibuprofen do not work.) Patient has a pending paperwork for Sports medicine. Likely will need an EMG/nerve conduction study.  Problem # 3:  ESSENTIAL HYPERTENSION, BENIGN (ICD-401.1) Uncontrolled.Will increase dose of lisinopril.Strongly advised to abstain from cocaine and tobacco. Her updated medication list for this problem includes:    Lisinopril 20 Mg Tabs (Lisinopril) .Marland Kitchen... Take 1 tablet by mouth once a day  BP today: 150/80 Prior BP: 153/97 (01/10/2010)  Labs Reviewed: K+: 3.8 (01/11/2010) Creat: : 0.68 (01/11/2010)   Chol: 210 (01/11/2010)   HDL: 51 (01/11/2010)   LDL: 118 (01/11/2010)   TG: 206 (01/11/2010)  Complete Medication List: 1)  Pravastatin Sodium 40 Mg Tabs (Pravastatin sodium) .... Take 1 tablet by mouth once a day 2)  Ventolin Hfa 108 (90 Base) Mcg/act Aers (Albuterol sulfate) .... Use two puffs four times a day as needed for wheezing. 3)  Lisinopril 20 Mg Tabs (Lisinopril) .... Take 1 tablet by mouth once a day 4)  Omeprazole 20 Mg Cpdr (Omeprazole) .... Take 1 tablet by mouth once a day 5)  Flovent Hfa 44 Mcg/act Aero (Fluticasone propionate  hfa) .... 2puffs two times a day 6)  Tramadol Hcl 50 Mg Tabs (Tramadol hcl) .... Take 1 tablet by mouth four times a day as needed  Other Orders: Mammogram (Screening) (Mammo)  Patient Instructions: 1)  Please,note newmedications added to your regimen. 2)  Clinic will contact you with your information for mammogram. 3)  Please, return hemocult cards. 4)  Call with any questions. 5)  Return to clinic in 3months (on an empty stomach). Prescriptions: TRAMADOL HCL 50 MG TABS (TRAMADOL HCL) Take 1 tablet by mouth four times a day as needed  #120 x 11   Entered and Authorized by:   Deatra Robinson MD   Signed by:   Deatra Robinson MD on 01/24/2010   Method used:   Electronically to        [facility] (retail)       [address]       [place], [place]  [address]        Ph: [phone]       Fax: [phone]   RxID:   [id number] LISINOPRIL 20 MG TABS (LISINOPRIL) Take 1 tablet by mouth once a day  #30 x 11   Entered and Authorized by:   Deatra Robinson MD   Signed by:   Deatra Robinson MD on 01/24/2010   Method used:   Electronically to        [facility] (retail)       [address]       [place], [place]  [address]       Ph: [phone]       Fax: [phone]   RxID:   [id number] FLOVENT HFA 44 MCG/ACT AERO (FLUTICASONE PROPIONATE  HFA) 2puffs two times a day  #1 x 11   Entered and Authorized by:   Deatra Robinson MD   Signed by:   Deatra Robinson MD on 01/24/2010   Method used:   Electronically to        [facility] (retail)       [address]       [place], [place]  [address]       Ph: [phone]       Fax: [phone]   RxID:   [id number] OMEPRAZOLE 20 MG CPDR (OMEPRAZOLE) Take 1 tablet by mouth once a day  #90 x 11   Entered and Authorized by:   Deatra Robinson MD   Signed by:   Deatra Robinson MD on 01/24/2010   Method used:   Electronically to        [facility] (retail)       [address]       [place], [place]  [address]       Ph: [phone]       Fax: [phone]   RxID:   [id number] VENTOLIN HFA 108 (90 BASE) MCG/ACT AERS (ALBUTEROL SULFATE) Use two puffs four times a day as needed for wheezing.  #1 x 11   Entered and Authorized by:   Deatra Robinson MD   Signed by:   Deatra Robinson MD on 01/24/2010   Method used:   Electronically to        [facility] (retail)  [address]       [place], [place]  [address]       Ph: [phone]       Fax: [phone]   RxID:   [id number] PRAVASTATIN SODIUM 40 MG  TABS (PRAVASTATIN SODIUM) Take 1 tablet by mouth once a day  #31 x 11   Entered and Authorized by:   Deatra Robinson MD   Signed by:   Deatra Robinson MD on  01/24/2010   Method used:   Electronically to        [facility] (retail)       [address]       [place], [place]  [address]       Ph: [phone]       Fax: [phone]   RxID:   [id number]   Prevention & Chronic Care Immunizations   Influenza vaccine: Not documented   Influenza vaccine deferral: Deferred  (11/30/2009)   Influenza vaccine due: 02/24/2010    Tetanus booster: Not documented   Td booster deferral: Deferred  (11/30/2009)   Tetanus booster due: 01/25/2020    Pneumococcal vaccine: Not documented  Other Screening   Pap smear: Not documented   Pap smear action/deferral: Not indicated S/P hysterectomy  (01/10/2010)    Mammogram: normal  (06/21/2007)   Mammogram action/deferral: Ordered  (01/24/2010)   Mammogram due: 06/20/2008   Smoking status: current  (01/24/2010)   Smoking cessation counseling: yes  (01/24/2010)  Lipids   Total Cholesterol: 210  (01/11/2010)   LDL: 118  (01/11/2010)   LDL Direct: Not documented   HDL: 51  (01/11/2010)   Triglycerides: 206  (01/11/2010)    SGOT (AST): 20  (01/11/2010)   SGPT (ALT): 24  (01/11/2010)   Alkaline phosphatase: 55  (01/11/2010)   Total bilirubin: 0.2  (01/11/2010)  Hypertension   Last Blood Pressure: 150 / 80  (01/24/2010)   Serum creatinine: 0.68  (01/11/2010)   Serum potassium 3.8  (01/11/2010)  Self-Management Support :   Personal Goals (by the next clinic visit) :      Personal LDL goal: 130  (01/10/2010)    Patient will work on the following items until the next clinic visit to reach self-care goals:     Medications and monitoring: take my medicines every day, bring all of my medications to every visit  (01/24/2010)     Eating: use fresh or frozen vegetables, eat foods that are low in salt, eat baked foods instead of fried foods  (01/24/2010)     Activity: take a 30 minute walk every day  (01/24/2010)    Hypertension self-management support: Resources for  patients handout  (01/10/2010)    Lipid self-management support: Education handout  (01/24/2010)     Lipid education handout printed   Nursing Instructions: Give tetanus booster today Schedule screening mammogram (see order)    Appended Document: dtap vaccine//kg    Nurse Visit   Allergies: 1)  ! Asa 2)  ! Dilaudid (Hydromorphone Hcl)  Immunizations Administered:  Tetanus Vaccine:    Vaccine Type: Tdap    Site: right deltoid    Mfr: GlaxoSmithKline    Dose: 0.5 ml    Route: IM    Given by: Cynda Familia ([facility]AMA)    Exp. Date: 12/24/2011    Lot #: [id number]    VIS given: 05/14/07 version given January 24, 2010.  PPD Results  Date of reading: 01/12/2010    Results: < 5mm    Interpretation: negative  Orders Added: 1)  Tdap => 87yrs IM [90715] 2)  Admin 1st Vaccine [95621]

## 2010-07-28 NOTE — Assessment & Plan Note (Signed)
Summary: HEPATITIS B PER PATIENT FOR WORK/DS  Nurse Visit   Allergies: 1)  ! Asa 2)  ! Dilaudid (Hydromorphone Hcl)  spoke w/ dr Aundria Rud, pt would need some lab work before hepb, pt states she is leaving because she does not have the time .Marin Roberts RN  May 12, 2010 8:47 AM

## 2010-07-28 NOTE — Assessment & Plan Note (Signed)
Summary: Smoking Cessation Counseling  Smoking Cessation Counseling.  40 minutes.  Met with Sarahann in my office to discuss quitting smoking.  Patient has multiple health issues including hypertension and hyperlipidemia.   Judithe's history is that she has been smoking since age 50 and now smokes about 5 cigarettes per day.  A pack will last her 4 or even 5 days.  She is a first thing in the morning smoker and also finds herself smoking when she is bored.  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  She will be working 4 days during the week from 7AM to [employer].    Linnae is motivated to quit at this time because of her health issues and hx of cancer on both sides of her family.  We discussed preparing for her quit date by making both her home and car smoke free.  She tells me her boyfriend is a very light smoker and will likely quit with her.  Etrulia knows about the quit line and will seriously consider linking herself with that service.    I've discussed nicotine gum and the patch with Yuleidy and she will likely go on the patch once she has some funds from her job.  She will start with the 14 mg patch and then go down to 7 mg.  She's not quite ready to come up with a quit date but said she will be ready within the next month or two.  She is somewhat worried about gaining weight and we discussed healthy snacks such as lower calorie fruits and vegetables she could eat as a substitute for her smoking.   She will meet with me on August 1st for follow-up and further planning.  She has been given the tip sheet to read as homework before our next meting.

## 2010-07-28 NOTE — Assessment & Plan Note (Signed)
Summary: ACUTE-TOOTH ACHE/OKAY TO ADD PER GAYLE(SILWAL)/CFB   Vital Signs:  Patient profile:   50 year old female Height:      59 inches (149.86 cm) Weight:      139.0 pounds (60.68 kg) BMI:     27.06 Temp:     98.4 degrees F (36.89 degrees C) oral Pulse rate:   70 / minute BP sitting:   149 / 89  (right arm) Cuff size:   regular  Vitals Entered By: Theotis Barrio NT II (July 30, 2009 10:43 AM) CC: RIGHT LOWER BOTTOM TOOTH PAIN / STARTED ABOUT 2 DAYS AGO-PAIN HAS GOTTEN WORSE Is Patient Diabetic? No Pain Assessment Patient in pain? yes     Location: R/LOWER BACK Intensity: 10 Type: sharp Onset of pain  ABOUT 2DAYS AGO Nutritional Status BMI of 25 - 29 = overweight  Have you ever been in a relationship where you felt threatened, hurt or afraid?No   Does patient need assistance? Functional Status Self care Ambulation Normal Comments RIGHT LOWER BOTTOM TOOTH PAIN  /  STARTED ABOUT 2 DAYS AGO  -  PAIN HAS GOTTEN WORSE   Primary Care Provider:  Peggye Pitt, MD  CC:  RIGHT LOWER BOTTOM TOOTH PAIN / STARTED ABOUT 2 DAYS AGO-PAIN HAS GOTTEN WORSE.  History of Present Illness: 50 yr old woman with pmhx as described below comes to the clinic complaining of toothache since monday. Tooth broke about a year ago. Pain located on right side. Intensity 10/10. "Worse than labor pains". Pain described as sharp and radiates up to her temperal area. Patient used oral gel and ibuprofen but did not help. Associated with right sided headache. Denies any fever or chills, or drainage. Patient has not been able to go to work because of pain.  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 Take 1 Tablet By Mouth Once A Day 2)  Pravastatin Sodium 40 Mg  Tabs (Pravastatin Sodium) .... Take 1 Tablet By Mouth Once A Day  Current Medications (verified): 1)  Gnp Omeprazole 20 Mg  Tbec (Omeprazole) .... Take 1 Tablet By Mouth Once A Day 2)  Pravastatin Sodium 40 Mg  Tabs (Pravastatin Sodium) .... Take 1 Tablet By Mouth Once A Day  Allergies: No Known Drug Allergies  Past History:  Past Medical History: Last updated: 05/28/2007 Hyperlipidemia  Risk Factors: Exercise: yes (07/30/2009)  Risk Factors: Smoking Status: current (07/30/2009)  Review of Systems       The patient complains of headaches.  The patient denies fever, chest pain, dyspnea on exertion, hemoptysis, abdominal pain, melena, hematochezia, hematuria, muscle weakness, and difficulty walking.    Physical Exam  General:  distress 2/2 pain Head:  normocephalic and atraumatic.   Eyes:  vision grossly intact.   Ears:  no external deformities.   Nose:  no external deformity.   Mouth:  Right molar chipped,  teeth missing, no drainage or fluctuance, no erythema Neck:  supple and full ROM.     Impression & Recommendations:  Problem # 1:  DENTAL PAIN (ICD-525.9) No evidence of tooth abscess on exam. Will check orthopanogram and cbc  to further assess for any infection/abscess. Will review and reasses need for antibiotic. Pain will be appropriately controlled. Patient given a script for Vicodin and was instructed to do Peridex swish/spit until seen by dentist. Patient is to return next week to talk to Lupita Leash to get orange card and then Dental appointment. Will follow up in two weeks.   Orders: Radiology other (Radiology Other) T-CBC w/Diff [phone])  Problem # 2:  ELEVATED  BLOOD PRESSURE (ICD-796.2) Most likely due to pain. Will recheck on follow up.  BP today: 149/89 Prior BP: 133/86 (05/28/2007)  Instructed in low sodium diet (DASH Handout) and behavior modification.    Problem # 3:  COCAINE ABUSE, EPISODIC (ICD-304.22) Will review lab.   Orders: T-Drug Screen-Urine, (single) [phone])  Complete Medication List: 1)  Gnp Omeprazole 20 Mg Tbec (Omeprazole) .... Take 1 tablet by mouth once a day 2)  Pravastatin Sodium 40 Mg Tabs (Pravastatin sodium) .... Take 1 tablet by mouth once a day 3)  Vicodin 5-500 Mg Tabs (Hydrocodone-acetaminophen) .... Take 1 tablet by mouth two times a day 4)  Peridex 0.12 % Soln (Chlorhexidine gluconate) .Marland KitchenMarland KitchenMarland Kitchen 15ml swish and spit two times a day  Patient Instructions: 1)  Please schedule a follow-up appointment in 2 weeks. 2)  Next week come see Lupita Leash to help you with insurance for Dental appointment. 3)  Take all medication as prescribed. 4)  You will be called with any abnormalities in the tests scheduled or performed today.  If you don't hear from [place] within a week from when the test was performed, you can assume that your test was normal.  Prescriptions: VICODIN 5-500 MG TABS (HYDROCODONE-ACETAMINOPHEN) Take 1 tablet by mouth two times a day  #30 x 1   Entered and Authorized by:   Laren Everts MD   Signed by:   Laren Everts MD on 07/30/2009   Method used:   Print then Give to Patient   RxID:   [id number] PERIDEX 0.12 % SOLN (CHLORHEXIDINE GLUCONATE) 15ml swish and spit two times a day  #1 x 3   Entered and Authorized by:   Laren Everts MD   Signed by:   Laren Everts MD on 07/30/2009   Method used:   Print then Give to Patient   RxID:   [id number]  Process Orders Check Orders Results:     [facility]: ABN not required for this insurance Tests Sent for requisitioning (July 30, 2009 11:42 AM):     07/30/2009: [facility] --  T-Drug Screen-Urine, (single) [[id number]] (signed)     07/30/2009: [facility] -- T-CBC w/Diff [[id number]] (signed)    Appended Document: ACUTE-TOOTH ACHE/OKAY TO ADD PER GAYLE(SILWAL)/CFB Administrative note Re: Behavior/complaint Pt. came into my office very upset about not being given something for pain before or during the office visit.  I really didn't have a clue what she was mad about.  I tried to find out.  After the fact I learned she was given a prescription for pain after the visit.  I was told that "I didn't care and I could go fuck myself because I don't give a shit.Marland KitchenMarland KitchenI'm just a black woman with no insurance"...  At this point I guess we were overheard because a man (  who?) entered the room and pulled her out, also stating "of course they don't care about you".  He continued to pull her away as she cursed me all the way to the east elevators. She was a problem for [place], NTII, didn't feel she needed to do UDS etc. Her relationship with this clinic needs to be discussed at the next visit, with me.  Further behavior like this will be grounds for dismissal. Clelia Croft [phone]

## 2010-08-18 ENCOUNTER — Encounter: Payer: Self-pay | Admitting: Internal Medicine

## 2010-08-18 ENCOUNTER — Ambulatory Visit (INDEPENDENT_AMBULATORY_CARE_PROVIDER_SITE_OTHER): Payer: Self-pay | Admitting: Internal Medicine

## 2010-08-18 VITALS — BP 121/77 | HR 78 | Temp 97.9°F | Ht 59.0 in | Wt 138.2 lb

## 2010-08-18 DIAGNOSIS — M76829 Posterior tibial tendinitis, unspecified leg: Secondary | ICD-10-CM

## 2010-08-18 MED ORDER — NAPROXEN 500 MG PO TABS: 500.0000 mg | ORAL_TABLET | Freq: Two times a day (BID) | ORAL | Status: AC

## 2010-08-18 NOTE — Assessment & Plan Note (Signed)
Pain is rather acute in onset but not associated with an injury. Advised on use of proper footwear, with trial of rest, ice, and naproxeN  (HAS HX ASA ALLERGY BUT HAS TOLERATED ALEVE AND IBUPROFEN WITHOUT DIFFICULTY IN PAST, AND HAS SPECIF RECALL OF TAKING THESE MEDICATIONS)  Will consider referral if sx's persist.

## 2010-08-18 NOTE — Progress Notes (Signed)
  Subjective:    Patient ID: [name], female    DOB: 1960/11/24, 50 y.o.   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 Does have a break mid-day when she can put her feet up, and says her supervisors very helpful when needed.  Review of Systems  Also c/o left shoulder pain, which is more chronic and unchanged    Objective:   Physical Exam  Musculoskeletal:       Feet:       Pain and tenderness as shown with slight warmth, possibly mild edema. Remainder of arch ok, non-tender.  The ankle, pre- and posterior tibial regions and muscle groups normal          Assessment & Plan:

## 2010-09-27 LAB — DIFFERENTIAL
Eosinophils Relative: 2 % (ref 0–5)
Lymphocytes Relative: 34 % (ref 12–46)
Lymphs Abs: 1.7 10*3/uL (ref 0.7–4.0)
Monocytes Absolute: 0.4 10*3/uL (ref 0.1–1.0)
Neutro Abs: 2.7 10*3/uL (ref 1.7–7.7)

## 2010-09-27 LAB — CBC
MCHC: 32.8 g/dL (ref 30.0–36.0)
MCV: 72.8 fL — ABNORMAL LOW (ref 78.0–100.0)
Platelets: 300 10*3/uL (ref 150–400)
RDW: 14.5 % (ref 11.5–15.5)
WBC: 4.9 10*3/uL (ref 4.0–10.5)

## 2010-09-27 LAB — POCT CARDIAC MARKERS
CKMB, poc: <1 ng/mL — ABNORMAL LOW (ref 1.0–8.0)
CKMB, poc: <1 ng/mL — ABNORMAL LOW (ref 1.0–8.0)
Troponin i, poc: <0.05 ng/mL (ref 0.00–0.09)
Troponin i, poc: <0.05 ng/mL (ref 0.00–0.09)

## 2010-09-27 LAB — COMPREHENSIVE METABOLIC PANEL
AST: 20 U/L (ref 0–37)
Albumin: 3.5 g/dL (ref 3.5–5.2)
Calcium: 8.3 mg/dL — ABNORMAL LOW (ref 8.4–10.5)
Creatinine, Ser: 0.61 mg/dL (ref 0.4–1.2)
GFR calc Af Amer: >60 mL/min (ref >60)
Total Protein: 6.4 g/dL (ref 6.0–8.3)

## 2010-10-05 ENCOUNTER — Ambulatory Visit: Payer: Self-pay | Admitting: Internal Medicine

## 2010-11-08 NOTE — Op Note (Signed)
[name], [name]             ACCOUNT NO.:  [id number]   MEDICAL RECORD NO.:  [id number]          PATIENT TYPE:  INP   LOCATION:  9315                          [facility]ILITY:  [facility]   PHYSICIAN:  Norton Blizzard, MD    DATE OF BIRTH:  07-11-1960   DATE OF PROCEDURE:  06/08/2008  DATE OF DISCHARGE:  04/30/2008                               OPERATIVE REPORT   PREOPERATIVE DIAGNOSES:  Symptomatic fibroid uterus, menometrorrhagia,  dysmenorrhea, and anemia.   POSTOPERATIVE DIAGNOSES:  Symptomatic fibroid uterus, menometrorrhagia,  dysmenorrhea, and anemia   PROCEDURE:  Total abdominal hysterectomy via Pfannenstiel incision.   SURGEON:  Norton Blizzard, MD   ASSISTANT:  Shelbie Proctor. Shawnie Pons, MD   ANESTHESIA:  General.   IV FLUIDS:  1800 mL of Lactated Ringer.   URINE OUTPUT:  175 mL.   ESTIMATED BLOOD LOSS:  300 mL.   INDICATIONS:  The patient is a 50 year old gravida 3, para 2-0-1-2 who  presented multiple times to the emergency room for disabling  dysmenorrhea, menometrorrhagia, and symptomatic fibroid uterus.  The  patient desired definitive surgical management.  During her preoperative  visit, the patient did state that she did not want her ovaries removed  unless they were abnormal.  The risks of surgery including bleeding,  infection, injury to surrounding organs, need for additional procedures  were discussed with the patient and written informed consent was  obtained.   FINDINGS:  Globular 12 weeks' size fibroid uterus.  Normal adnexae  bilaterally.  There was significant adhesion of the bladder to the lower  uterine segment and cervix.   SPECIMENS:  Uterus plus cervix.   DISPOSITION OF SPECIMENS:  Pathology.   COMPLICATIONS:  None immediately after procedure.   PROCEDURE IN DETAIL:  The patient received preoperative IV antibiotics  and had sequential compression devices placed in both lower extremities  in the preoperative area.  She was then taken to the operating  room  where general anesthesia was administered and found to be adequate.  The  patient was then placed  in the dorsal supine position, and prepped and  draped in a sterile manner.  A Foley catheter was inserted into the  patient's bladder and attached to constant gravity.  Attention was then  turned to the patient's abdomen, where a Pfannenstiel incision was made  over her pre-existing incision using a scalpel.  This incision was taken  down to the layer of the fascia.  The fascia was incised in the midline  and the incision was extended bilaterally using Mayo scissors.  Kochers  were then placed on the superior part of the fascial incision, and the  underlying rectus muscles were dissected off bluntly and sharply.  A  similar process was carried out on the inferior aspect.  The rectus  muscles were separated in midline bluntly, and the peritoneum was  entered bluntly.  This incision was extended bilaterally with good  visualization of bowel and bladder.  Attention was then turned to the  uterus.  Two large Kelly clamps were used to place on the cornua on each  side of the uterus.  At this point, the bowel was packed away and a  Balfour retractor was placed, which provided excellent exposure.  The  round ligaments on both sides were then clamped, transected, and suture  ligated allowing entry into the broad ligament.  Of note, the sutures  used in the surgery are 0-Vicryl unless otherwise noted.  The anterior  leaf of the broad ligament was dissected anteriorly creating a bladder  flap  and the bladder was dissected off the lower uterine segment and  cervix using blunt and sharp methods.  At this point, a hole was made in  the clear portion of the posterior broad ligament and the adnexae on  both sides were clamped, transected, and doubly suture ligated allowing  for the adnexae to remain in placed.  The broad ligament was then  serially transected and the uterine arteries on both sides  were  skeletonized.  The uterine artery was then clamped, transected, and  suture ligated on both sides.  The uterosacral and cardinal ligaments  were also clamped, cut, and suture ligated on both sides.  Acutely  curved clamps were then placed underneath the cervix, and the specimen  was amputated using Jorgenson scissors.  The corners of the vaginal cuff  were then secured using Heaney stitches and intervening segment of  vaginal cuff was closed using figure-of-8 stitches, with care given to  incorporate the anterior pubocervical fascia and the posterior  rectovaginal fascia.  Overall, good hemostasis was noted.  During the  procedure, bilateral ureters were recognized to be peristalsing and away  from the area of surgery.  The pelvis was copiously irrigated and  hemostasis was reconfirmed on all surfaces.  All instruments were then  removed from the patient's pelvis.  An attempt was made to reapproximate  the peritoneum, but this was not possible.  There were some bleeding  noted on the rectus muscles bed which was controlled using coagulation.  The fascia was then closed with looped PDS in a running stitch.  There  was some subcutaneous bleeding which was controlled using coagulation  and pressure, and the skin was closed with staples.  A pressure dressing  was placed on the incision at the end of the case.  The patient  tolerated the procedure well.  Sponge, instrument, and needle counts  were correct x2.  She was taken to the recovery room in stable  condition.      Norton Blizzard, MD  Electronically Signed     UAD/MEDQ  D:  06/08/2008  T:  06/09/2008  Job:  [id number]

## 2010-11-08 NOTE — Group Therapy Note (Signed)
NAMEMarland Kitchen  [name], [name] NO.:  [id number]   MEDICAL RECORD NO.:  [id number]          PATIENT TYPE:  WOC   LOCATION:  [facility]                   FACILITY:  [facility]   PHYSICIAN:  Argentina Donovan, MD        DATE OF BIRTH:  [birth year]   DATE OF SERVICE:                                  CLINIC NOTE   HISTORY:  The patient is a 50 year old African American female gravida  3, para 2-0-1-2 who is sent in via [facility] where she has had repeated visits  for severe disabling dysmenorrhea.  She works as a Agricultural engineer and  is unable to function or stand on her feet during her period for several  days before.  She has some fibroids on ultrasound, but she certainly has  a history that is probably more consistent with adenomyosis with  extremely heavy periods, bearing down, falling out feeling that she gets  and then pain radiating to her thighs and back.  I have discussed this  with her.  With the exception that she is in very good health, I think  she does need a hysterectomy because of the size of the uterus and the  position of fibroids.  I have scheduled her for LAVH with a possibility  of abdominal hysterectomy and possible oophorectomy.  We are going to  see her prior to this surgery for consultation, history and physical at  that time.   PHYSICAL EXAMINATION:  VITAL SIGNS:  Today her blood pressure is 121/76,  pulse is 86.  She weighs 128 pounds and is 4 feet 11 inches tall.  ABDOMEN:  Soft, flat, nontender.  No masses or organomegaly.  EXTERNAL GENITALIA:  Normal.  EBUS within normal limits.  Uterus is  clean and well rugated.  Cervix is parous.  Pap smear was taken.  The  uterus is approximately [redacted] weeks gestational size with a palpable  fibroid over to the right side.  The adnexa could not be delineated from  the mass on that side.  On the left side, the ovary felt normal.   PAST MEDICAL HISTORY:  The patient has a history of 2 cesarean sections.  Other than that, her past  medical history is very negative.   SOCIAL HISTORY:  She does smoke 1 pack of cigarettes every 3 days and  has done that since she was 50 years old.  She rarely drinks alcohol and  takes no illicit drugs.   She is on no chronic medication, but she often goes in during her period  to the maternity admissions emergency room for a shot because she is so  terribly uncomfortable.  She states they told her that she was anemic,  although we have not seen any CBC in the recent past from the emergency  rooms.  We are going to get a CBC today.  I have scheduled her for the  above.   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  In addition to this, the endometrial stripe was 6 mm and  her Pap smear was done today.  I do not think that it  is necessary to do  an endometrial biopsy on this patient, but she does need the  hysterectomy.           ______________________________  Argentina Donovan, MD     PR/MEDQ  D:  01/30/2008  T:  01/30/2008  Job:  [id number]

## 2010-11-08 NOTE — Group Therapy Note (Signed)
[name], [name]             ACCOUNT NO.:  [id number]   MEDICAL RECORD NO.:  [id number]          PATIENT TYPE:  WOC   LOCATION:  [facility]                   FACILITY:  [facility]   PHYSICIAN:  Johnella Moloney, MD        DATE OF BIRTH:  [birth year]   DATE OF SERVICE:  04/16/2008                                  CLINIC NOTE   CHIEF COMPLAINT:  Menometrorrhagia and dysmenorrhea.   The patient is here for endometrial biopsy and surgical evaluation.   HISTORY OF PRESENT ILLNESS:  The patient is a 50 year old gravida 3,  para 2-0-1-2 who has been seen multiple times in the emergency room for  disabling dysmenorrhea and menometrorrhagia.  She was last seen by Dr.  Okey Dupre on January 30, 2008, for further details please refer to his note.  Since her last visit, patient reports that she continues to have a lot  of pain and is very interested in definitive surgical management in the  form of a hysterectomy.  The patient reports no other concerns.   PAST MEDICAL HISTORY:  Negative.   PAST SURGICAL HISTORY:  Remarkable for two cesarean sections.   MEDICATIONS:  None.   ALLERGIES:  ASPIRIN WHICH CAUSES HIVES.  THE PATIENT IS NOT ALLERGIC TO  NSAIDS OR LATEX.   SOCIAL HISTORY:  The patient smokes one-pack of cigarettes every 3 days  and has done so for 26 years.   PAST OB/GYN HISTORY:  The patient had menarche at age 32.  She does not  have regular cycles, her cycles are characterized by heavy flow and  severe pain.  The patient did have a bilateral tubal ligation.  She  denies any history of abnormal Pap smears and her last Pap smear was in  August 2009.   FAMILY HISTORY:  Patient had a sister who had a hysterectomy and at age  22 for symptomatic fibroids.  There is no history of gynecologic  cancers.   PHYSICAL EXAMINATION:  Temperature is 98.1, pulse 83, respirations 16,  blood pressure 130/92, weight 134.5 pounds, height 4 foot 11.  GENERAL:  No apparent distress.  ABDOMEN:  Soft,  nontender, nondistended.  PELVIC EXAM:  Normal external female genitalia.  Pink, well rugated  vagina.  Normal cervix.   Endometrial biopsy.  The patient was counseled regarding need of  endometrial biopsy, the risks of the biopsy including bleeding,  infection, injury to her surrounding organs, need for additional  procedures were explained to the patient and written informed consent  was obtained.  After the pelvic examination her cervix was swabbed with  Betadine and a tenaculum was placed in the anterior lip of cervix. A 3  mm Pipelle was introduced into the uterine cavity to a depth of 11 cm,  the suction was activated and Pipelle was slowly rotated to obtain  moderate amount of tissue from the endometrial lining.  Only one pass  was made.  The patient has a small amount of bleeding at the end of the  procedure and tenaculum was removed from the patient's cervix with a  small amount of bleeding noted which was controlled using pressure.  ASSESSMENT AND PLAN:  The patient is 50 year old gravida 3, para 2 who  is being evaluated for menometrorrhagia and dysmenorrhea.  The patient  just underwent an endometrial biopsy, will follow up for her results and  patient will also follow up in 2 weeks to discuss results of this.  Patient is interested in definitive surgical management.  The risks of  surgery including bleeding, infection, injury to surrounding organs,  need for additional procedures were explained to the patient.  She will  be contacted by the surgical scheduler to give her the date and time of  her surgery.  The patient was also given care notes that detailed the  pre-care, inpatient care and discharge care related to total abdominal  hysterectomy.  Further discussion about her surgery will occur when she  does come back in 2 weeks for discussion of endometrial biopsy results.           ______________________________  Johnella Moloney, MD     UD/MEDQ  D:  04/16/2008  T:   04/16/2008  Job:  [id number]

## 2010-11-08 NOTE — Discharge Summary (Signed)
[name], [name]             ACCOUNT NO.:  [id number]   MEDICAL RECORD NO.:  [id number]          PATIENT TYPE:  OBV   LOCATION:  6711                         FACILITY:  [facility]   PHYSICIAN:  Peggye Pitt, M.D. DATE OF BIRTH:  02-Jul-1960   DATE OF ADMISSION:  04/18/2007  DATE OF DISCHARGE:  04/20/2007                               DISCHARGE SUMMARY   DISCHARGE DIAGNOSES:  1. Chest pain and left arm pain, ruled out for myocardial infarction      and for cervical neck disease.  2. Iron deficiency anemia.  3. Hyperlipidemia.  4. Tobacco abuse.  5. Cocaine abuse.   DISCHARGE MEDICATIONS:  1. Iron sulfate 225 mg one tablet p.o. b.i.d. with meals.  2. Zocor 20 mg p.o. q.h.s.  3. Nicotine patch 21 mg, change daily for 7 days, then 14 mg changed      daily for 7 days, then 7 mg changed daily for 7 more days.   DISPOSITION AND FOLLOW UP:  Since this is a Saturday, the patient will  be called on Monday with the hospital follow up appointment at the [facility].  She has also been provided with the number in  case she does not hear back from [place] early next week.   IMAGING PERFORMED DURING THIS HOSPITALIZATION:  1. Chest x-ray on April 18, 2007 consistent with cardiac enlargement      and mild vascular congestion.  2. An MRI of the C-spine on April 20, 2007 that showed a somewhat      emotion degraded examination with minor disk bulges throughout the      cervical region, but no sign of a dominant herniation or any neural      compressive stenosis.  3. The patient also had a 2D echocardiogram on April 18, 2007 that      showed an ejection fraction of 65% with no left ventricular      regional wall motion abnormalities.  Diastolic function parameters      were normal.  Mildly increased estimated peak right ventricular      systolic pressure.   HISTORY AND PHYSICAL EXAM:  For full details, please refer to the chart.  In brief, [name] is a 50 year old  [name] with  smoking history, unknown hypertension, no cardiac family history, and  diabetes, who complained of sharp left-sided chest pain with radiation  down into her left arm and into her neck which started the afternoon  prior to admission while sitting, and since then has been coming and  going, each episode lasting about 10 minutes.  On day of admission, the  chest pain woke her up from sleep associated with nausea and  diaphoresis, and mild shortness of breath which is why she called EMS.  By the time EMS arrived, she no longer had any chest pain, she was  brought in for further examination.   VITALS UPON ADMISSION:  Showed a temperature of 97.9, blood pressure  136/82, heart rate 74, respirations 20, O2 sats were 100% on room air.   LABS UPON ADMISSION:  Showed a sodium of 139,  potassium 3.5 chloride  107, bicarbonate 24, BUN 11, creatinine 0.5, a glucose of 108, anion gap  of 8.  Her UDS was positive for cocaine and for opiates.  Her point of  care markers were negative.  Her D-dimer was negative at 0.25.  Hemoglobin was 10.2.   HOSPITAL COURSE BY PROBLEM:  1. Chest pain:  Patient ruled out for myocardial infarction with 3      sets of negative cardiac enzymes.  Her UDS did come back positive      for cocaine, so she might have had some transient coronary      vasospasm.  Because of her description of pain in her neck with      radiation in her left arm, we also performed an MRI of the C-spine      just to rule out any cervical disk disease that could be causing      neural radiculopathy, and the results of the MRI have been      previously described which only showed some mild disk disease with      no neural foraminal stenosis.  She will be discharged home and is      advised to return if she has any recurrence of the chest pain.  2. Anemia:  Her ferritin was found to be 18, which is borderline low.      We have started her on iron sulfate.  B12 and folic  acid levels are      within normal limits.  3. Poly substance abuse:  She has been counseled extensively.  She      would like to quit smoking and has been agreeable to using a      nicotine patch as we have prescribed today.  Will need a follow up      with this in the outpatient setting.  4. Hyperlipidemia:  Found during this admission.  Her cholesterol      panel is as follows:  Cholesterol 182, triglycerides 124, HDL 41,      LDL 116.  Because of her chest pain, it was decided to start her on      a very low dose of the statin Zocor 20 mg daily.  She should have a      c-Met and a follow up FLP in about 6 weeks to monitor.   VITAL SIGNS ON DAY OF DISCHARGE:  Temperature 98.2, blood pressure  128/78, heart rate 68, respirations 20.   LABS ON DAY OF DISCHARGE:  Sodium 140, potassium 3.9, chloride 108,  bicarbonate 27, BUN 7, creatinine 0.59, glucose of 94, calcium 8.7.  WBC  4.9, hemoglobin 8.3, platelets 257,000.  TSH of 1.559.  Ferritin of 18.      Peggye Pitt, M.D.  Electronically Signed     EH/MEDQ  D:  04/20/2007  T:  04/21/2007  Job:  [id number]

## 2010-11-08 NOTE — Discharge Summary (Signed)
[name], [name]             ACCOUNT NO.:  [id number]   MEDICAL RECORD NO.:  [id number]          PATIENT TYPE:  INP   LOCATION:  9315                          [facility]   PHYSICIAN:  Norton Blizzard, MD    DATE OF BIRTH:  [birth year]   DATE OF ADMISSION:  06/08/2008  DATE OF DISCHARGE:  06/10/2008                               DISCHARGE SUMMARY   ADMISSION DIAGNOSES:  1. Symptomatic fibroid uterus.  2. Menometrorrhagia.  3. Anemia.   DISCHARGE DIAGNOSIS:  Status post total abdominal hysterectomy.   PROCEDURES:  Total abdominal hysterectomy.   STUDIES:  The patient had a preoperative hemoglobin of 10.1 and a postop  hemoglobin of 8.3.  No symptoms of anemia, so no transfusions were  administered.   BRIEF HOSPITAL COURSE:  The patient is a 50 year old gravida 3, para 2-0-  1-2, who had a long history of menometrorrhagia and anemia secondary to  a fibroid uterus.  The patient desired definitive surgical management  and underwent an uncomplicated total abdominal hysterectomy on June 08, 2008.  For further details of the surgery, please refer to separate  dictated operative report.  The patient had an uncomplicated  postoperative course.  By postoperative day #2, her pain was controlled  on oral pain medications.  She was tolerating regular diet, voiding, and  ambulating without difficulty.  The patient was also passing flatus.  She was deemed stable for discharge to home.   DISCHARGE MEDICATIONS:  1. Percocet 525 mg p.o. q.6h. p.r.n. pain.  2. Colace 100 mg p.o. b.i.d. p.r.n. constipation.  3. Ibuprofen 600 mg p.o. q.6h. p.r.n. pain.   DISCHARGE INSTRUCTIONS:  The patient was told to avoid driving while on  Percocet.  No lifting of anything greater than 15 pounds for 8 weeks .  No sexual activity for the next 8 weeks.  She was also she could shower,  but to avoid sitting in bathtubs or in pools of water.  She was also  told to keep her incision clean and dry and  to call for any abnormal  drainage, abnormal bleeding, fevers, or any other concerns.   FOLLOWUP APPOINTMENTS:  The patient has an appointment in [facility] for  staple removal on June 12, 2008, at 9:30 a.m.  She also has an  appointment for postoperative check on July 02, 2008, at 1:00 p.m.      Norton Blizzard, MD  Electronically Signed     UAD/MEDQ  D:  06/10/2008  T:  06/11/2008  Job:  [id number]

## 2010-11-09 ENCOUNTER — Encounter: Payer: Self-pay | Admitting: Internal Medicine

## 2010-11-09 ENCOUNTER — Ambulatory Visit (INDEPENDENT_AMBULATORY_CARE_PROVIDER_SITE_OTHER): Payer: Self-pay | Admitting: Internal Medicine

## 2010-11-09 VITALS — BP 116/79 | HR 57 | Temp 98.1°F | Ht 59.0 in | Wt 133.9 lb

## 2010-11-09 DIAGNOSIS — M76829 Posterior tibial tendinitis, unspecified leg: Secondary | ICD-10-CM

## 2010-11-09 MED ORDER — NAPROXEN SODIUM 220 MG PO TABS: 440.0000 mg | ORAL_TABLET | Freq: Two times a day (BID) | ORAL | Status: DC

## 2010-11-09 NOTE — Patient Instructions (Signed)
Tendinitis (Tendonitis, Tenosynovitis) You have tendinitis. Tendinitis is a swelling and irritation (inflammation) of the tissue called tendons. These are cord like structure that connect muscle to bone. It commonly occurs at the:   Shoulders (rotator cuff).   Heels (achilles tendon).   Elbows (triceps tendon).  It usually is caused by overusing the tendon and joint involved. When the tissue surrounding a tendon (synovium) becomes inflamed, it is called tenosynovitis. This also is often the result of overuse in people whose jobs require repetitive (over and over again) types of motion. HOME CARE INSTRUCTIONS  Use the orthotics every day.  Apply ice to the injury for 20 minutes 2-3 times per day. Put the ice in a plastic bag. Place a towel between the bag of ice and your skin.   Try to avoid use other than gentle range of motion while the tendon is painful. Use and exercise only as directed by your caregiver. Stop exercises or range of motion if pain or discomfort increase unless directed otherwise by your caregiver.   Only take over-the-counter or prescription medicines for pain, discomfort, or fever as directed by your caregiver.  SEEK MEDICAL CARE IF:  Your pain and swelling increase.   You develop new, unexplained symptoms, especially increased numbness in the hands.  MAKE SURE YOU:   Understand these instructions.   Will watch your condition.   Will get help right away if you are not doing well or get worse.  Document Released: 06/09/2000 Document Re-Released: 09/08/2008 Exit[employer] Patient Information 2011 [employer], [place].  1. Start Aleve 220 mg tablets take 2 tablets with breakfast and 2 tablets with supper.  It is okay to use tylenol 500 mg tablets 1-2 every 6 hours as needed for the pain.    2. Stop at [facility] and find the Dr. Margart Sickles kiosk.  Follow the instructions and try the one that it recommends.  They are about 50-60 dollars.  Use them every day.  3. Work on finishing  off the information needed for your orange card.  The next step if today's plan doesn't work is to have you go see a foot doctor.  4. You can contact [facility] at [phone] and ask about the cost for the visit and the price of orthotics.    5. Follow up in 2-4 weeks if the pain does not improve with the medicine and orthotics.

## 2010-11-09 NOTE — Progress Notes (Signed)
Subjective:    Patient ID: [name], female    DOB: February 25, 1961, 50 y.o.   MRN: [id number]  HPI  [name] is a 50 year old woman who presents to clinic today complaining of left foot pain. She was seen by Dr. Reche Dixon in February for the same pain.  The pain is in the left foot on the medial portion of the heel.  She states that the pain is worse when she is walking or standing for a long period of time.  She was told to take naproxen 500 mg BID, ice the foot, and get a good supportive pair of shoes and rest the foot then.  She states she is taking the medications regularly and icing as much as she can but the pain is not improved.  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  The ice helps when she is able to do it and if she sits with her feet up the pain is reduced.  She denies any fevers, chills, painful joints, redness, warmth in the foot, sores or open drainage from the area.    Review of Systems    Constitutional: Denies fever, chills, diaphoresis, appetite change and fatigue.  HEENT: Denies photophobia, eye pain, redness, hearing loss, ear pain, congestion, sore throat, rhinorrhea, sneezing, mouth sores, trouble swallowing, neck pain, neck stiffness and tinnitus.   Respiratory: Denies SOB, DOE, cough, chest tightness,  and wheezing.   Cardiovascular: Denies chest pain, palpitations and leg swelling.  Gastrointestinal: Denies nausea, vomiting, abdominal pain, diarrhea, constipation, blood in stool and abdominal distention.  Genitourinary: Denies dysuria, urgency, frequency, hematuria, flank pain and difficulty urinating.  Musculoskeletal: Denies myalgias, back pain, joint swelling, arthralgias and gait problem.  Skin: Denies pallor, rash and wound.  Neurological: Denies dizziness, seizures, syncope, weakness, light-headedness, numbness and headaches.  Hematological: Denies adenopathy. Easy bruising, personal or family bleeding history    Psychiatric/Behavioral: Denies suicidal ideation, mood changes, confusion, nervousness, sleep disturbance and agitation   Objective:   Physical Exam    Constitutional: Vital signs reviewed.  Patient is a well-developed and well-nourished woman in no acute distress and cooperative with exam. Alert and oriented x3.  Head: Normocephalic and atraumatic Ear: TM normal bilaterally Mouth: no erythema or exudates, MMM Eyes: PERRL, EOMI, conjunctivae normal, No scleral icterus.  Neck: Supple, Trachea midline normal ROM, No JVD, mass, thyromegaly, or carotid bruit present.  Cardiovascular: RRR, S1 normal, S2 normal, no MRG, pulses symmetric and intact bilaterally Pulmonary/Chest: CTAB, no wheezes, rales, or rhonchi Abdominal: Soft. Non-tender, non-distended, bowel sounds are normal, no masses, organomegaly, or guarding present.  GU: no CVA tenderness Musculoskeletal: No joint deformities, erythema, or stiffness, ROM full and nontender in both ankles, knees, and hips.  There is point tenderness on the medial aspect of the left heel.  No pain to eversion or inversion, dorsal flexion, or plantar flexion.  There is an area of mild swelling and on standing her left ankle everts ~10 degrees.  Gait favors the left foot.   Hematology: no cervical, inginal, or axillary adenopathy.  Neurological: A&O x3, Strenght is normal and symmetric bilaterally, cranial nerve II-XII are grossly intact, no focal motor deficit, sensory intact to light touch bilaterally.  Skin: Warm, dry and intact. No rash, cyanosis, or clubbing.  Psychiatric: Normal mood and affect. speech and behavior is normal. Judgment and thought content normal. Cognition and memory are normal.       Assessment &  Plan:

## 2010-11-09 NOTE — Assessment & Plan Note (Signed)
Her symptoms are consistent with tendonitis in the medial ankle.  Likely secondary to tibialis posterior but single point tenderness over the area is difficult to assess.  We will continue naproxen 220 mg tablets take 2 tabs BID with meals, and ice the area.  She has no insurance so MRI of the area, which would give the best picture, would be a burden with only minimal assistance in the outcome so we will hold off imaging at this time.  She also would be difficult to refer to podiatry for custom orthotics without insurance.  She is working on the [employer] with [employer] and has a few other pieces of paperwork to bring in.  I encouraged her to finish this so if it does not improve we can get her into see a podiatrist.  In the mean time we will have her pick up some Dr. Margart Sickles orthotic inserts at [facility].  They run about 50-60 dollars and she was instructed to use them daily while she is working.  She will follow up in 2-4 weeks if the pain does not improve.  At that time other options would be a walking boot immobilization for 3 weeks vs podiatry.

## 2010-11-11 NOTE — Group Therapy Note (Signed)
[name], [name]                       ACCOUNT NO.:  [id number]   MEDICAL RECORD NO.:  [id number]                   PATIENT TYPE:  OUT   LOCATION:  [facility]                           FACILITY:  [facility]   PHYSICIAN:  Maurice March, MD                    DATE OF BIRTH:  04/08/1961   DATE OF SERVICE:  01/05/2004                                    CLINIC NOTE   CHIEF COMPLAINT:  Follow up chronic pelvic pain.   SUBJECTIVE:  Ms. Grandmaison is a new patient to the GYN clinic today.  She is  here for follow up of chronic pelvic pain.  She was seen in the [facility] 2 weeks  ago with pain.  A ultrasound was done which showed a small 2.9 cm fibroid  with a normal endometrial stripe of 4 to 5 mm in thickness.  A GC and  Chlamydia were done at that time which were negative.  A urinalysis was done  which also is negative and showed no red blood cells.  RPR was negative at  that time as well.   The history of the pain is as follows.  Since age 50, which was  approximately 10 years ago, she has had 1 week of pain prior to her menses.  She rates the pain as a 10/10.  The pain is all suprapubically.  She was  seen by Dr. Gaynell Face back 10 years ago and was tried on 3 different oral  contraceptives as well as Vicoprofen.  None of those helped.  The pain then  resolved after a few years and she stopped seeing him.  The pain has since  returned this month and she has had the pain for a few days now.  She has  also had pain for the week prior to her last menstrual period which was on  July 3rd.  She does take Pamprin every morning which seems to help a little  bit but not entirely.   REVIEW OF SYSTEMS:  For the last 2 months she has had heavy cramping menses  with clots.  Her periods last approximately 1 week.  She has no intermittent  bleeding.  She does have hot flashes approximately 3 to 4 a week.  Positive  dyspareunia.  No vaginal dryness.  Positive mood swings.   PAST MEDICAL HISTORY:  She has a  history of anemia and a hiatal hernia.   PAST SURGICAL HISTORY:  She had a BTL in 1998.   OBSTETRICAL HISTORY:  She is a G 3, P 2-0-1-2 with a SAB in the second  trimester.   SOCIAL HISTORY:  She smokes one pack of tobacco every 3 to 4 days.  No  alcohol but she has a history of drinking, not excessively.   FAMILY HISTORY:  Her sister had a hysterectomy at age 20 for symptomatic  fibroids.  Her mother may have gone through menopause in her 37s.  There is  no history of female cancer.   OBJECTIVE:  VITAL SIGNS:  Noted.  GENERAL:  She is a well-appearing female in no acute distress.  ABDOMEN:  Exam was benign.  PELVIC:  Normal external female genitalia, normal urethra and vaginal walls.  Her cervix appears multiparous without lesions.  Bimanual exam reveals  normal-sized nonpregnant uterus with palpable adnexa bilaterally with no  masses.   ASSESSMENT AND PLAN:  1. Chronic pelvic pain.  The patient refuses oral contraceptives at this     point.  I do not want to give her narcotics so we will try a trial of     diclofenac 75 mg b.i.d., dispense #60, I have given her 1 refill.  She     does not have insurance or Medicaid so Lupron would be out of the     question.  A surgical procedure for 1 month of pain is unreasonable.  2. Iron deficiency anemia.  Her CBC done in the [facility]     showed hemoglobin of 9.7 with a MCV of 71.  I have put her on iron     sulfate 325 mg b.i.d. with food.  This will need to be rechecked in 6     weeks.  3. Health maintenance.  A Pap smear has been done today.   She is to follow up in 1 month for the chronic pain.                                               Maurice March, MD    LC/MEDQ  D:  01/05/2004  T:  01/05/2004  Job:  [id number]

## 2011-01-14 ENCOUNTER — Encounter: Payer: Self-pay | Admitting: Internal Medicine

## 2011-02-03 ENCOUNTER — Other Ambulatory Visit: Payer: Self-pay | Admitting: Internal Medicine

## 2011-03-20 ENCOUNTER — Telehealth: Payer: Self-pay | Admitting: *Deleted

## 2011-03-20 NOTE — Telephone Encounter (Signed)
Pt informed and voices understanding 

## 2011-03-20 NOTE — Telephone Encounter (Signed)
Pt called with c/o headache, rating it 10/10. Onset Saturday. Pain to temple area. No blurred vision, nausea just headache. Has not taken anything for this.  Pt calling from work. Pt # [id number]

## 2011-03-20 NOTE — Telephone Encounter (Signed)
She can take tylenol or ibuprofen for the headache.  She can also try a ice pack over the eyes and laying down in a dark room.  If she can not stand it she should be seen at urgent care or the ED otherwise we will see her on Wednesday.  If she has a fever >101 F she should be seen right away.

## 2011-03-21 ENCOUNTER — Telehealth: Payer: Self-pay | Admitting: *Deleted

## 2011-03-21 NOTE — Telephone Encounter (Signed)
Another call from Jaely stating she is still having a headache and it is not relieved with tylenol or aleve. She is at work and wants something else. Pt # [id number]

## 2011-03-21 NOTE — Telephone Encounter (Signed)
Pt informed and will go to [facility] after she gets off work. I offered an appointment her but she wants to wait unitl after 6:00 when she gets off work.

## 2011-03-21 NOTE — Telephone Encounter (Signed)
If she has a headache that is not responding to over the counter medications she needs to be seen either by [place] or urgent care or the ER.

## 2011-03-22 ENCOUNTER — Encounter: Payer: Self-pay | Admitting: Internal Medicine

## 2011-03-22 ENCOUNTER — Ambulatory Visit (INDEPENDENT_AMBULATORY_CARE_PROVIDER_SITE_OTHER): Payer: Self-pay | Admitting: Internal Medicine

## 2011-03-22 DIAGNOSIS — I10 Essential (primary) hypertension: Secondary | ICD-10-CM

## 2011-03-22 DIAGNOSIS — Z23 Encounter for immunization: Secondary | ICD-10-CM

## 2011-03-22 DIAGNOSIS — R51 Headache: Secondary | ICD-10-CM

## 2011-03-22 DIAGNOSIS — R519 Headache, unspecified: Secondary | ICD-10-CM | POA: Insufficient documentation

## 2011-03-22 DIAGNOSIS — E785 Hyperlipidemia, unspecified: Secondary | ICD-10-CM

## 2011-03-22 LAB — COMPREHENSIVE METABOLIC PANEL
ALT: 26 U/L (ref 0–35)
Albumin: 4.2 g/dL (ref 3.5–5.2)
Alkaline Phosphatase: 54 U/L (ref 39–117)
CO2: 22 mEq/L (ref 19–32)
Glucose, Bld: 83 mg/dL (ref 70–99)
Potassium: 4.3 mEq/L (ref 3.5–5.3)
Sodium: 142 mEq/L (ref 135–145)
Total Bilirubin: 0.4 mg/dL (ref 0.3–1.2)
Total Protein: 7.2 g/dL (ref 6.0–8.3)

## 2011-03-22 LAB — LIPID PANEL
LDL Cholesterol: 80 mg/dL (ref 0–99)
Total CHOL/HDL Ratio: 3 Ratio

## 2011-03-22 NOTE — Patient Instructions (Addendum)
Sinus Headache A sinus headache is when your sinuses become clogged or swollen. Sinus headaches can range from mild to severe. Some sinus headaches can actually be migraine headaches.  CAUSES  A sinus headache can have different causes, such as:   Colds.   Sinus infections.   Allergies.   Toothaches.   Weather changes.   Stress.  SYMPTOMS  Symptoms of a sinus headache may vary and can include:   Headache.   Pain or pressure in the face.   Congested or runny nose.   Fever.   Inability to smell.  TREATMENT  The treatment of a sinus headache depends on the cause:   Antibiotics. If you have a sinus infection, antibiotic treatment may be needed to treat the infection.   Antihistamines and nasal cortisone medicine. Sinus pain caused by allergies may be helped by these types of medication.   Nasal saline solution. Pain caused by congestion may be decreased by irrigating the nose and sinuses with saline solution.  HOME CARE INSTRUCTIONS  Take medications as told by your caregiver.   If you are taking antibiotics for a sinus infection, make sure you take all of the medication as told.   Nasal sprays may help reduce pressure symptoms.   Only take over-the-counter or prescription medicines for pain, discomfort, or fever as told by your caregiver.  SEEK IMMEDIATE MEDICAL CARE IF YOU DEVELOP:  A high fever or chills (Temperature greater than 102.5 F or 39.2 C).   Headaches more than once per week.   Sensitivity to light or sound.   Repeated nausea and vomiting.   Problems seeing.   Sudden severe pain in your face or head.   Seizures.   Confused thinking.  MAKE SURE YOU:   Understand these instructions.   Will watch your condition.   Will get help right away if you are not doing well or get worse.  Document Released: 07/20/2004 Document Re-Released: 09/06/2009 [facility] Patient Information 2011 [place], [place].  1. You can use an OTC decongestant for the  pressure in your sinuses.  Mucinex is another option that you can get over the counter as well.  2.  Pain relief with Ibuprofen 200 mg tablets take 2-3 every 6 hours for the pain.  You can also use tylenol 500 mg 1-2 tablets every 6 hours for the pain.  3.  Work on getting your orange card done to help pay for your medications and your doctor bills.  4.  Stop in the lab to have your blood drawn.  If there is anything we need to follow up on I will call you.  5.  Follow up in about 3 months.

## 2011-03-22 NOTE — Progress Notes (Signed)
Subjective:   Patient ID: [name] female   DOB: Jan 25, 1961 50 y.o.   MRN: [id number]  HPI: [name] is a 50 y.o. woman who presents to clinic today complaining of two days of headaches mostly in the center of the face. She states that the pain is a pressure and does not radiate.  She also states that her throat has been sore and she has been tearing up. 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  She tried tylenol and Advil and they helped some but not much. She denies nasal drainage, post nasal drip, ear symptoms, fevers, chills, nausea, or vomiting.  She also denies SOB, cough, chest pain, or abdominal pain.    She still has not taken care of her orange card so we have several health maintenance items to take care of but we have to be wary of the cost.  She is not a candidate for Pap smears because she has a hysterectomy for fibroids in 05/2008.  She still has her ovaries intact.   She is interested in getting a flu shot today.  She also is overdue for a follow up lipid panel.  She is on Pravastatin 40 mg currently.    Past Medical History  Diagnosis Date  . Hyperlipidemia    Current Outpatient Prescriptions  Medication Sig Dispense Refill  . albuterol (VENTOLIN HFA) 108 (90 BASE) MCG/ACT inhaler Inhale into the lungs 4 (four) times daily as needed. For wheezing       . ferrous sulfate (IRON SUPPLEMENT) 325 (65 FE) MG tablet Take 325 mg by mouth 3 (three) times daily.        . fluticasone (FLOVENT HFA) 44 MCG/ACT inhaler Inhale 2 puffs into the lungs 2 (two) times daily.        Marland Kitchen lisinopril (PRINIVIL,ZESTRIL) 20 MG tablet Take 1 tablet (20 mg total) by mouth daily.  30 tablet  3  . naproxen sodium (ALEVE) 220 MG tablet Take 2 tablets (440 mg total) by mouth 2 (two) times daily with a meal.  120 tablet  11  . omeprazole (PRILOSEC) 20 MG capsule Take 20 mg by mouth daily.        . pravastatin (PRAVACHOL) 40 MG tablet Take 1 tablet (40 mg total) by  mouth daily.  30 tablet  3  . traMADol (ULTRAM) 50 MG tablet Take 50 mg by mouth 4 (four) times daily as needed.         Family History  Problem Relation Age of Onset  . Cancer Mother 3    pancreatic cancer  . Cancer Father 64    throat cancer  . Cancer Maternal Aunt 60    breast cancer   History   Social History  . Marital Status: Divorced    Spouse Name: N/A    Number of Children: N/A  . Years of Education: N/A   Occupational History  . 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 Smoking status: Current Everyday Smoker -- 0.3 packs/day    Types: Cigarettes  . Smokeless tobacco: None  . Alcohol Use: Yes     occasionally  . Drug Use: No  . Sexually Active: None   Other Topics Concern  . None   Social History Narrative   10 th grade education.   Review of Systems: Negative except as noted in the HPI.    Objective:  Physical Exam: Filed Vitals:   03/22/11 0826  BP: 130/84  Pulse: 79  Temp: 97.9 F (36.6 C)  TempSrc: Oral  Height: 4\' 11"  (1.499 m)  Weight: 135 lb 12.8 oz (61.598 kg)   Constitutional: Vital signs reviewed.  Patient is a well-developed and well-nourished woman in no acute distress and cooperative with exam. Alert and oriented x3.  Head: Normocephalic and atraumatic, there is mild tenderness to palpation over the maxillary sinuses bilaterall.   Ear: TM normal bilaterally Nose: there is mild erythema in the turbinates with drainage noted.   Mouth: no erythema or exudates, MMM Eyes: PERRL, EOMI, conjunctivae normal, No scleral icterus.  Neck: Supple, Trachea midline normal ROM, No JVD, mass, thyromegaly, or carotid bruit present.  Cardiovascular: RRR, S1 normal, S2 normal, no MRG, pulses symmetric and intact bilaterally Pulmonary/Chest: CTAB, no wheezes, rales, or rhonchi Abdominal: Soft. Non-tender, non-distended, bowel sounds are normal, no masses, organomegaly, or guarding present.  GU: no CVA  tenderness Musculoskeletal: No joint deformities, erythema, or stiffness, ROM full and no nontender Hematology: no cervical, inginal, or axillary adenopathy.  Neurological: A&O x3, Strength is normal and symmetric bilaterally, cranial nerve II-XII are grossly intact, no focal motor deficit, sensory intact to light touch bilaterally.  Skin: Warm, dry and intact. No rash, cyanosis, or clubbing.  Psychiatric: Normal mood and affect. speech and behavior is normal. Judgment and thought content normal. Cognition and memory are normal.   Assessment & Plan:

## 2011-03-23 LAB — URINALYSIS, ROUTINE W REFLEX MICROSCOPIC
Bilirubin Urine: NEGATIVE
Glucose, UA: NEGATIVE
Ketones, ur: NEGATIVE
Leukocytes, UA: NEGATIVE
Nitrite: NEGATIVE
Protein, ur: NEGATIVE
Specific Gravity, Urine: >1.03 — ABNORMAL HIGH
Urobilinogen, UA: 0.2
pH: 5.5

## 2011-03-23 LAB — WET PREP, GENITAL
Trich, Wet Prep: NONE SEEN
Yeast Wet Prep HPF POC: NONE SEEN

## 2011-03-23 LAB — GC/CHLAMYDIA PROBE AMP, GENITAL
Chlamydia, DNA Probe: NEGATIVE
GC Probe Amp, Genital: NEGATIVE

## 2011-03-23 LAB — POCT PREGNANCY, URINE
Operator id: 251141
Preg Test, Ur: NEGATIVE

## 2011-03-23 LAB — URINE MICROSCOPIC-ADD ON

## 2011-03-30 LAB — CBC
HCT: 31.2 % — ABNORMAL LOW (ref 36.0–46.0)
Hemoglobin: 10.1 g/dL — ABNORMAL LOW (ref 12.0–15.0)
Hemoglobin: 8.3 g/dL — ABNORMAL LOW (ref 12.0–15.0)
MCHC: 32.2 g/dL (ref 30.0–36.0)
MCHC: 32.9 g/dL (ref 30.0–36.0)
MCV: 73.1 fL — ABNORMAL LOW (ref 78.0–100.0)
MCV: 73.5 fL — ABNORMAL LOW (ref 78.0–100.0)
Platelets: 294 10*3/uL (ref 150–400)
RBC: 3.43 MIL/uL — ABNORMAL LOW (ref 3.87–5.11)
RDW: 14.5 % (ref 11.5–15.5)
WBC: 8.5 10*3/uL (ref 4.0–10.5)

## 2011-03-30 LAB — TYPE AND SCREEN

## 2011-04-02 ENCOUNTER — Encounter: Payer: Self-pay | Admitting: Internal Medicine

## 2011-04-02 NOTE — Assessment & Plan Note (Signed)
Her headache is most consistent with a sinus headache.  She has little drainage and no fever so we will treat with OTC decongestant and Mucinex as well as tylenol and advil for pain control.  If she continues to have problems we can try Flonase since there is likely an allergic component.

## 2011-04-02 NOTE — Assessment & Plan Note (Signed)
Lab Results  Component Value Date   NA 142 03/22/2011   K 4.3 03/22/2011   CL 107 03/22/2011   CO2 22 03/22/2011   BUN 13 03/22/2011   CREATININE 0.60 03/22/2011   CREATININE 0.68 01/11/2010    BP Readings from Last 3 Encounters:  03/22/11 130/84  11/09/10 116/79  08/18/10 121/77    Assessment: Hypertension control:  controlled  Progress toward goals:  at goal Barriers to meeting goals:  no barriers identified  Plan: Hypertension treatment:  continue current medications She is well within her goal of <140/90 so we will continue her current medications.

## 2011-04-02 NOTE — Assessment & Plan Note (Signed)
Lab Results  Component Value Date   CHOL 147 03/22/2011   CHOL 210* 01/11/2010   Lab Results  Component Value Date   HDL 49 03/22/2011   HDL 51 1/61/0960   Lab Results  Component Value Date   LDLCALC 80 03/22/2011   LDLCALC 118* 01/11/2010   Lab Results  Component Value Date   TRIG 91 03/22/2011   TRIG 206* 01/11/2010   Lab Results  Component Value Date   CHOLHDL 3.0 03/22/2011   CHOLHDL 4.1 Ratio 01/11/2010   No results found for this basename: LDLDIRECT   Her last LDL is well within her goal so we will continue her current Pravastatin dose and we will continue to monitor on a yearly basis.

## 2011-04-05 LAB — DIFFERENTIAL
Basophils Absolute: 0
Basophils Relative: 1
Eosinophils Absolute: 0.1
Eosinophils Relative: 1
Monocytes Absolute: 0.4
Monocytes Relative: 8
Neutro Abs: 3

## 2011-04-05 LAB — LIPID PANEL
Cholesterol: 182
HDL: 41
LDL Cholesterol: 116 — ABNORMAL HIGH
Total CHOL/HDL Ratio: 4.4
Triglycerides: 124

## 2011-04-05 LAB — BASIC METABOLIC PANEL
BUN: 7
CO2: 26
CO2: 27
CO2: 27
Calcium: 8.4
Calcium: 8.7
Calcium: 8.7
Chloride: 107
Chloride: 108
Chloride: 108
Creatinine, Ser: 0.59
Creatinine, Ser: 0.74
GFR calc Af Amer: >60
Glucose, Bld: 101 — ABNORMAL HIGH
Glucose, Bld: 87
Glucose, Bld: 94
Sodium: 142

## 2011-04-05 LAB — CBC
Hemoglobin: 8.2 — ABNORMAL LOW
Hemoglobin: 8.4 — ABNORMAL LOW
MCHC: 32.1
MCHC: 32.2
MCHC: 32.6
MCV: 72.5 — ABNORMAL LOW
MCV: 72.7 — ABNORMAL LOW
MCV: 73 — ABNORMAL LOW
RBC: 3.5 — ABNORMAL LOW
RBC: 3.56 — ABNORMAL LOW
RDW: 14.5 — ABNORMAL HIGH
RDW: 14.9 — ABNORMAL HIGH
RDW: 15 — ABNORMAL HIGH

## 2011-04-05 LAB — POCT I-STAT CREATININE: Operator id: 198171

## 2011-04-05 LAB — I-STAT 8, (EC8 V) (CONVERTED LAB)
Chloride: 107
Glucose, Bld: 108 — ABNORMAL HIGH
HCT: 30 — ABNORMAL LOW
Potassium: 3.5
Sodium: 139
TCO2: 25

## 2011-04-05 LAB — CARDIAC PANEL(CRET KIN+CKTOT+MB+TROPI)
Relative Index: INVALID
Relative Index: INVALID
Troponin I: 0.01
Troponin I: 0.02

## 2011-04-05 LAB — IRON AND TIBC: Saturation Ratios: 10 — ABNORMAL LOW

## 2011-04-05 LAB — CK TOTAL AND CKMB (NOT AT ARMC)
CK, MB: 0.5
Total CK: 39

## 2011-04-05 LAB — D-DIMER, QUANTITATIVE: D-Dimer, Quant: 0.25

## 2011-04-05 LAB — RAPID URINE DRUG SCREEN, HOSP PERFORMED
Benzodiazepines: NOT DETECTED
Cocaine: POSITIVE — AB
Tetrahydrocannabinol: NOT DETECTED

## 2011-04-05 LAB — TSH: TSH: 1.559

## 2011-04-05 LAB — POCT CARDIAC MARKERS
CKMB, poc: <1 — ABNORMAL LOW
Operator id: 198171

## 2011-06-22 ENCOUNTER — Other Ambulatory Visit: Payer: Self-pay | Admitting: *Deleted

## 2011-06-22 MED ORDER — PRAVASTATIN SODIUM 40 MG PO TABS: 40.0000 mg | ORAL_TABLET | Freq: Every day | ORAL | Status: DC

## 2011-06-22 MED ORDER — LISINOPRIL 20 MG PO TABS: 20.0000 mg | ORAL_TABLET | Freq: Every day | ORAL | Status: DC

## 2011-07-19 ENCOUNTER — Emergency Department (HOSPITAL_COMMUNITY)
Admission: EM | Admit: 2011-07-19 | Discharge: 2011-07-19 | Disposition: A | Discharge status: Home / Self Care | Payer: Self-pay | Attending: Emergency Medicine | Admitting: Emergency Medicine

## 2011-07-19 ENCOUNTER — Encounter (HOSPITAL_COMMUNITY): Payer: Self-pay | Admitting: Family Medicine

## 2011-07-19 DIAGNOSIS — R0981 Nasal congestion: Secondary | ICD-10-CM

## 2011-07-19 DIAGNOSIS — Z79899 Other long term (current) drug therapy: Secondary | ICD-10-CM | POA: Insufficient documentation

## 2011-07-19 DIAGNOSIS — R040 Epistaxis: Secondary | ICD-10-CM | POA: Insufficient documentation

## 2011-07-19 DIAGNOSIS — E785 Hyperlipidemia, unspecified: Secondary | ICD-10-CM | POA: Insufficient documentation

## 2011-07-19 DIAGNOSIS — J3489 Other specified disorders of nose and nasal sinuses: Secondary | ICD-10-CM | POA: Insufficient documentation

## 2011-07-19 MED ORDER — OXYMETAZOLINE HCL 0.05 % NA SOLN: NASAL | Status: DC

## 2011-07-19 NOTE — ED Notes (Signed)
Pt reports she felt her nose draining in the back of her throat and today about an hour ago her nose started bleeding from both sides. Bleeding is controlled at this time.

## 2011-07-19 NOTE — ED Provider Notes (Signed)
History     CSN: [id number]  Arrival date & time 07/19/11  1818   First MD Initiated Contact with Patient 07/19/11 2055      Chief Complaint  Patient presents with  . Epistaxis    (Consider location/radiation/quality/duration/timing/severity/associated sxs/prior treatment) HPI  Patient presents to ER complaining of a few day hx of post nasal drip and cough as well as sinus congestion over the last couple of days but then states that today she was at work and had acute onset nose bleed today. Nosebleed resolved PTA after holding pressure. Patient has PCP follow up on Friday established for recheck of URI symptoms. Patient states she has never had a nosebleed before so it "frightened her." Denies HA, dizziness, easy bruising or anticoagulant use. Symptoms were acute onset but resolved. Patient had been taking OTC cough and cold medication PTA.   Past Medical History  Diagnosis Date  . Hyperlipidemia     Past Surgical History  Procedure Date  . Abdominal hysterectomy 12/09    Secondary to fibroids  . Cesarean section     Family History  Problem Relation Age of Onset  . Cancer Mother 69    pancreatic cancer  . Cancer Father 101    throat cancer  . Cancer Maternal Aunt 60    breast cancer    History  Substance Use Topics  . Smoking status: Current Everyday Smoker -- 0.3 packs/day    Types: Cigarettes  . Smokeless tobacco: Not on file  . Alcohol Use: Yes     occasionally    OB History    Grav Para Term Preterm Abortions TAB SAB Ect Mult Living                  Review of Systems  All other systems reviewed and are negative.    Allergies  Aspirin and Hydromorphone hcl  Home Medications   Current Outpatient Rx  Name Route Sig Dispense Refill  . FERROUS SULFATE 325 (65 FE) MG PO TABS Oral Take 325 mg by mouth 3 (three) times daily.     Marland Kitchen LISINOPRIL 20 MG PO TABS Oral Take 1 tablet (20 mg total) by mouth daily. 30 tablet 3  . OMEPRAZOLE 20 MG PO CPDR Oral  Take 20 mg by mouth daily.     Marland Kitchen PRAVASTATIN SODIUM 40 MG PO TABS Oral Take 40 mg by mouth daily.    . ALBUTEROL SULFATE HFA 108 (90 BASE) MCG/ACT IN AERS Inhalation Inhale 1 puff into the lungs 4 (four) times daily as needed. For wheezing      BP 142/87  Pulse 87  Temp(Src) 98.7 F (37.1 C) (Oral)  Resp 20  SpO2 99%  Physical Exam  Nursing note and vitals reviewed. Constitutional: She is oriented to person, place, and time. She appears well-developed and well-nourished. No distress.  HENT:  Head: Normocephalic and atraumatic.       Nasal mucosa is boggy and friable bilaterally but no active bleeding. No blood in posterior pharynx.   Eyes: Conjunctivae are normal.  Neck: Normal range of motion. Neck supple.  Cardiovascular: Normal rate, regular rhythm, normal heart sounds and intact distal pulses.  Exam reveals no gallop and no friction rub.   No murmur heard. Pulmonary/Chest: Effort normal and breath sounds normal. No respiratory distress. She has no wheezes. She has no rales. She exhibits no tenderness.  Abdominal: Soft. Bowel sounds are normal. She exhibits no distension and no mass. There is no tenderness. There is no  rebound and no guarding.  Musculoskeletal: Normal range of motion.  Neurological: She is alert and oriented to person, place, and time.  Skin: Skin is warm and dry. No rash noted. She is not diaphoretic. No erythema.  Psychiatric: She has a normal mood and affect.    ED Course  Procedures (including critical care time)  Labs Reviewed - No data to display No results found.   1. Nasal congestion   2. Epistaxis       MDM  URI symptoms with boggy friable nasal mucosa but no active bleeding. Likely nose bleed secondary to current URI. VSS.         Jenness Corner, [place] 07/20/11 [phone]

## 2011-07-20 NOTE — ED Provider Notes (Signed)
Medical screening examination/treatment/procedure(s) were performed by non-physician practitioner and as supervising physician I was immediately available for consultation/collaboration.  Gerhard Munch, MD 07/20/11 [phone]

## 2011-07-21 ENCOUNTER — Encounter: Payer: Self-pay | Admitting: Internal Medicine

## 2011-08-30 ENCOUNTER — Other Ambulatory Visit: Payer: Self-pay | Admitting: *Deleted

## 2011-08-30 MED ORDER — PRAVASTATIN SODIUM 40 MG PO TABS: 40.0000 mg | ORAL_TABLET | Freq: Every day | ORAL | Status: DC

## 2011-08-30 MED ORDER — LISINOPRIL 20 MG PO TABS: 20.0000 mg | ORAL_TABLET | Freq: Every day | ORAL | Status: DC

## 2011-08-30 NOTE — Telephone Encounter (Signed)
Message sent to front desk to schedule appointment.  

## 2011-08-30 NOTE — Telephone Encounter (Signed)
I'll refill these today but I need to see her back in follow up since its been over 6 months.  Thanks!

## 2011-10-13 ENCOUNTER — Encounter: Payer: Self-pay | Admitting: Internal Medicine

## 2011-11-22 ENCOUNTER — Encounter: Payer: Self-pay | Admitting: *Deleted

## 2011-11-22 NOTE — Progress Notes (Unsigned)
Pt called from work with c/o pain to rt temporal area of head.  Onset today.  Denies visual problems/fever. She tried IBU with no relief. Hx of same and she had sinus infection. Wants to be seen  Offered appointment today but she is working and can't come in until 6:00.  I told her we can not call in medication without seeing her. If pain gets worse please go to [facility] after work.  We can see her tomorrow if she will come in.

## 2011-11-22 NOTE — Progress Notes (Signed)
OK 

## 2011-12-01 ENCOUNTER — Encounter: Payer: Self-pay | Admitting: Internal Medicine

## 2011-12-01 ENCOUNTER — Ambulatory Visit (INDEPENDENT_AMBULATORY_CARE_PROVIDER_SITE_OTHER): Payer: Self-pay | Admitting: Internal Medicine

## 2011-12-01 VITALS — BP 102/66 | HR 92 | Temp 97.1°F | Ht 59.0 in | Wt 139.3 lb

## 2011-12-01 DIAGNOSIS — M25512 Pain in left shoulder: Secondary | ICD-10-CM

## 2011-12-01 DIAGNOSIS — M25519 Pain in unspecified shoulder: Secondary | ICD-10-CM

## 2011-12-01 DIAGNOSIS — M19019 Primary osteoarthritis, unspecified shoulder: Secondary | ICD-10-CM

## 2011-12-01 MED ORDER — MELOXICAM 7.5 MG PO TABS: 7.5000 mg | ORAL_TABLET | Freq: Every day | ORAL | Status: DC

## 2011-12-01 MED ORDER — LISINOPRIL 20 MG PO TABS: 20.0000 mg | ORAL_TABLET | Freq: Every day | ORAL | Status: DC

## 2011-12-01 MED ORDER — ALBUTEROL SULFATE HFA 108 (90 BASE) MCG/ACT IN AERS: 1.0000 | INHALATION_SPRAY | Freq: Four times a day (QID) | RESPIRATORY_TRACT | Status: DC | PRN

## 2011-12-01 MED ORDER — PRAVASTATIN SODIUM 40 MG PO TABS: 40.0000 mg | ORAL_TABLET | Freq: Every day | ORAL | Status: DC

## 2011-12-01 MED ORDER — RANITIDINE HCL 150 MG PO TABS: 150.0000 mg | ORAL_TABLET | Freq: Two times a day (BID) | ORAL | Status: DC

## 2011-12-01 NOTE — Patient Instructions (Signed)
Shoulder Pain The shoulder is a ball and socket joint. The muscles and tendons (rotator cuff) are what keep the shoulder in its joint and stable. This collection of muscles and tendons holds in the head (ball) of the humerus (upper arm bone) in the fossa (cup) of the scapula (shoulder blade). Today no reason was found for your shoulder pain. Often pain in the shoulder may be treated conservatively with temporary immobilization. For example, holding the shoulder in one place using a sling for rest. Physical therapy may be needed if problems continue. HOME CARE INSTRUCTIONS   Apply ice to the sore area for 15 to 20 minutes, 3 to 4 times per day for the first 2 days. Put the ice in a plastic bag. Place a towel between the bag of ice and your skin.   If you have or were given a shoulder sling and straps, do not remove for as long as directed by your caregiver or until you see a caregiver for a follow-up examination. If you need to remove it to shower or bathe, move your arm as little as possible.   Sleep on several pillows at night to lessen swelling and pain.   Only take over-the-counter or prescription medicines for pain, discomfort, or fever as directed by your caregiver.   Keep any follow-up appointments in order to avoid any type of permanent shoulder disability or chronic pain problems.  SEEK MEDICAL CARE IF:   Pain in your shoulder increases or new pain develops in your arm, hand, or fingers.   Your hand or fingers are colder than your other hand.   You do not obtain pain relief with the medications or your pain becomes worse.  SEEK IMMEDIATE MEDICAL CARE IF:   Your arm, hand, or fingers are numb or tingling.   Your arm, hand, or fingers are swollen, painful, or turn white or blue.   You develop chest pain or shortness of breath.  MAKE SURE YOU:   Understand these instructions.   Will watch your condition.   Will get help right away if you are not doing well or get worse.    Document Released: 03/22/2005 Document Revised: 06/01/2011 Document Reviewed: 05/27/2011 [employer] Patient Information 2012 [employer]. 

## 2011-12-01 NOTE — Progress Notes (Signed)
  Subjective:    Patient ID: [name], female    DOB: 10-Jan-1961, 51 y.o.   MRN: [id number]  HPI Patient is here today for left shoulder pain.  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  Right arm is normal.  Wants refills of her chronic meds.   Review of Systems  Constitutional: Negative for fever, activity change and appetite change.  HENT: Negative for sore throat.   Respiratory: Negative for cough and shortness of breath.   Cardiovascular: Negative for chest pain and leg swelling.  Gastrointestinal: Negative for nausea, abdominal pain, diarrhea, constipation and abdominal distention.  Genitourinary: Negative for frequency, hematuria and difficulty urinating.  Musculoskeletal:       Shoulder pain and inability to move.  Neurological: Negative for dizziness and headaches.  Psychiatric/Behavioral: Negative for suicidal ideas and behavioral problems.       Objective:   Physical Exam  Constitutional: She is oriented to person, place, and time. She appears well-developed and well-nourished.  HENT:  Head: Normocephalic and atraumatic.  Eyes: Conjunctivae and EOM are normal. Pupils are equal, round, and reactive to light. No scleral icterus.  Neck: Normal range of motion. Neck supple. No JVD present. No thyromegaly present.  Cardiovascular: Normal rate, regular rhythm, normal heart sounds and intact distal pulses.  Exam reveals no gallop and no friction rub.   No murmur heard. Pulmonary/Chest: Effort normal and breath sounds normal. No respiratory distress. She has no wheezes. She has no rales.  Abdominal: Soft. Bowel sounds are normal. She exhibits no distension and no mass. There is no  tenderness. There is no rebound and no guarding.  Musculoskeletal:       Arms: Lymphadenopathy:    She has no cervical adenopathy.  Neurological: She is alert and oriented to person, place, and time.  Psychiatric: She has a normal mood and affect. Her behavior is normal.          Assessment & Plan:

## 2011-12-01 NOTE — Progress Notes (Signed)
Addended by: Lars Mage on: 12/01/2011 03:34 PM   Modules accepted: Orders

## 2011-12-01 NOTE — Assessment & Plan Note (Signed)
New problem. Differential is wide from arthritis, rotator cuff injury, adhesive capsulitis. 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 mobic and instructions to use ice 2-3 times a day along with sports medicine referral at this time. No imaging study ordered as I patient is uninsured and also sports medicine will be better able to decide about the study of choice after visualizing the joint with ultrasound. Patient agreeable to the plan.

## 2011-12-12 ENCOUNTER — Ambulatory Visit: Payer: Self-pay | Admitting: Family Medicine

## 2011-12-15 NOTE — Addendum Note (Signed)
Addended by: Neomia Dear on: 12/15/2011 06:19 PM   Modules accepted: Orders

## 2012-04-04 ENCOUNTER — Telehealth: Payer: Self-pay | Admitting: *Deleted

## 2012-04-04 NOTE — Telephone Encounter (Signed)
Pt called c/o of diarrhea and vomiting since last Saturday. Has no transportation. Suggest to go to ER when she has transportation or pt can call clinic while clinic is open for appt. Stanton Kidney Avielle Imbert RN 04/04/12 10:30AM

## 2012-04-25 ENCOUNTER — Other Ambulatory Visit: Payer: Self-pay | Admitting: Internal Medicine

## 2012-04-25 ENCOUNTER — Telehealth: Payer: Self-pay | Admitting: *Deleted

## 2012-04-25 DIAGNOSIS — J069 Acute upper respiratory infection, unspecified: Secondary | ICD-10-CM

## 2012-04-25 MED ORDER — GUAIFENESIN-DM 100-10 MG/5ML PO SYRP: 5.0000 mL | ORAL_SOLUTION | Freq: Three times a day (TID) | ORAL | Status: DC | PRN

## 2012-04-25 MED ORDER — BENZONATATE 100 MG PO CAPS: 100.0000 mg | ORAL_CAPSULE | Freq: Three times a day (TID) | ORAL | Status: DC | PRN

## 2012-04-25 NOTE — Telephone Encounter (Signed)
I sent in a cough medicine and tessalon perles to the [facility] she has on file.  One should be on the $4 list, the other, I am not sure if it will be too expensive.  Please ask her to call in and come to be seen if she is not getting better after the weekend.   Thanks

## 2012-04-25 NOTE — Telephone Encounter (Signed)
Pt called with c/o chills, nasal drainage and congestion. Onset yesterday.  ? Temp.  She wants something OTC to help her.  She is not sure what meds will be safe to take for cough and congestion. She does not want a clinic visit  Hx: HTN,  Pt [phone]

## 2012-04-25 NOTE — Telephone Encounter (Signed)
Pt informed

## 2012-04-29 ENCOUNTER — Ambulatory Visit (INDEPENDENT_AMBULATORY_CARE_PROVIDER_SITE_OTHER): Payer: Self-pay | Admitting: Internal Medicine

## 2012-04-29 VITALS — BP 138/84 | HR 89 | Temp 98.6°F | Ht 59.0 in | Wt 140.7 lb

## 2012-04-29 DIAGNOSIS — R059 Cough, unspecified: Secondary | ICD-10-CM

## 2012-04-29 DIAGNOSIS — R05 Cough: Secondary | ICD-10-CM

## 2012-04-29 NOTE — Patient Instructions (Addendum)
[name],  We believe that your symptoms are most likely due to a viral illness affecting your upper airways, causing acute bronchitis. Since this is not a bacterial cause, antibiotic treatment is not indicated. We recommend treating the symptoms of your illness, as it will gradually resolve on its own. We recommend warm water gargles and steam inhalations 2-3 times per day, and continuing to take the cough medications and Tussin DM cough syrup (equate brand from [facility]) previously prescribed. Furthermore, we recommend that you avoid cold beverages.  If your symptoms persist in 3 weeks time, please return to see [place].

## 2012-04-30 NOTE — Progress Notes (Signed)
  Subjective:    Patient ID: [name], female    DOB: [birth year], 51 y.o.   MRN: [id number]  HPI  Patient is a 51 year old female with past medical history most significant for asthma comes in with complaints of cough for past one week. Cough is associated with whitish sputum. There is no increased shortness of breath associated with cough. Patient does complain of congestion, and sore throat along with cough. No fever, chills, nausea, vomiting, change in appetite noted. No other complaints at this time.  Review of Systems  All other systems reviewed and are negative.       Objective:   Physical Exam  Constitutional: She is oriented to person, place, and time. She appears well-developed and well-nourished.  HENT:  Head: Normocephalic and atraumatic.  Mouth/Throat: Oropharyngeal exudate present.  Eyes: Conjunctivae normal and EOM are normal. Pupils are equal, round, and reactive to light. No scleral icterus.  Neck: Normal range of motion. Neck supple. No JVD present. No thyromegaly present.       Bilateral cervical adenopathy without tenderness  Cardiovascular: Normal rate, regular rhythm, normal heart sounds and intact distal pulses.  Exam reveals no gallop and no friction rub.   No murmur heard. Pulmonary/Chest: Effort normal and breath sounds normal. No respiratory distress. She has no wheezes. She has no rales.  Abdominal: Soft. Bowel sounds are normal. She exhibits no distension and no mass. There is no tenderness. There is no rebound and no guarding.  Musculoskeletal: Normal range of motion. She exhibits no edema and no tenderness.  Lymphadenopathy:    She has cervical adenopathy.  Neurological: She is alert and oriented to person, place, and time.  Psychiatric: She has a normal mood and affect. Her behavior is normal.          Assessment & Plan:

## 2012-04-30 NOTE — Progress Notes (Signed)
Subjective- [name] is a 51 year old lady with a hx of HLP, HTN, asthma, and GERD that presents with presents with a 6 day history of productive cough. Her symptoms started at work with a cough with occasional production of clear mucus, that sometimes ends in vomiting. She has also had headaches and tenderness above her eyebrows. She has tried loratidine OTC and benzonatate, with minimal relief of symptoms.  Objective-  Vitals- wnl, afebrile Gen- NAD, pleasant HEENT- right maxillary sinus tenderness, oropharyngeal erythema, cervical LAD without tenderness Lungs- Bilateral expiratory wheezes in upper lung fields, no cyanosis/clubbing Cardio- RRR, no mrg  Assessment and Plan- **[name] is most likely suffering from a viral illness, giving her acute bronchitis. It will likely end on its own without medical intervention. We recommended warm water gargles, steam inhalations, and continuing benzonatate and tussin DM.

## 2012-04-30 NOTE — Assessment & Plan Note (Signed)
After listening to the history, physical exam and evaluating the patient based on centor criteria, her symptoms does not seem to be bacterial in origin. Patient is most likely suffering from a viral upper respiratory tract infection. Patient was given instructions to start warm water gargles and steam inhalation 2-3 times a day. She was also instructed to start Tussin DM over-the-counter as a cough suppressant. Patient was instructed to seek medical advice if her symptoms get worse or if she starts having high grade fevers. Patient voiced understanding.

## 2012-06-05 ENCOUNTER — Ambulatory Visit: Payer: Self-pay | Admitting: Radiation Oncology

## 2012-06-05 ENCOUNTER — Ambulatory Visit (INDEPENDENT_AMBULATORY_CARE_PROVIDER_SITE_OTHER): Payer: Self-pay | Admitting: Internal Medicine

## 2012-06-05 ENCOUNTER — Telehealth: Payer: Self-pay | Admitting: *Deleted

## 2012-06-05 ENCOUNTER — Encounter: Payer: Self-pay | Admitting: Internal Medicine

## 2012-06-05 VITALS — BP 119/76 | HR 77 | Temp 98.6°F | Ht 59.0 in | Wt 143.9 lb

## 2012-06-05 DIAGNOSIS — Z0000 Encounter for general adult medical examination without abnormal findings: Secondary | ICD-10-CM

## 2012-06-05 DIAGNOSIS — I10 Essential (primary) hypertension: Secondary | ICD-10-CM

## 2012-06-05 DIAGNOSIS — K5792 Diverticulitis of intestine, part unspecified, without perforation or abscess without bleeding: Secondary | ICD-10-CM | POA: Insufficient documentation

## 2012-06-05 DIAGNOSIS — R1032 Left lower quadrant pain: Secondary | ICD-10-CM

## 2012-06-05 DIAGNOSIS — R109 Unspecified abdominal pain: Secondary | ICD-10-CM

## 2012-06-05 LAB — CBC
HCT: 33.8 % — ABNORMAL LOW (ref 36.0–46.0)
Hemoglobin: 10.5 g/dL — ABNORMAL LOW (ref 12.0–15.0)
MCV: 72.4 fL — ABNORMAL LOW (ref 78.0–100.0)
WBC: 7.4 10*3/uL (ref 4.0–10.5)

## 2012-06-05 MED ORDER — CIPROFLOXACIN HCL 500 MG PO TABS: 500.0000 mg | ORAL_TABLET | Freq: Two times a day (BID) | ORAL | Status: AC

## 2012-06-05 MED ORDER — METRONIDAZOLE 500 MG PO TABS: 500.0000 mg | ORAL_TABLET | Freq: Three times a day (TID) | ORAL | Status: AC

## 2012-06-05 NOTE — Telephone Encounter (Signed)
Pt calls c/o waking w/ L lower abd pain, 10/10 scale, she states it must be ovary pain, states she has her ovaries but had a partial hyst several years ago. Denies ever having this pain before. She has not taken anything and wishes to be seen. appt per [place]. Dr Lavena Bullion at [phone]

## 2012-06-05 NOTE — Patient Instructions (Addendum)
We will treat you for diverticulitis with antibiotics Metronidazole and Ciprofloxacin. Take as directed for at least 7 days. Eat only a liquid diet for the next couple of days. If your pain worsens go to the Emergency Room or Urgent Care for further evaluation.  This may include a CT Scan of your abdomen. If there are abnormalities that we are concerned about on your blood or urine tests, we will contact you. We will refer you to have a colonoscopy and to been seen by your gynecologist.   Diverticulitis A diverticulum is a small pouch or sac on the colon. Diverticulosis is the presence of these diverticula on the colon. Diverticulitis is the irritation (inflammation) or infection of diverticula. CAUSES  The colon and its diverticula contain bacteria. If food particles block the tiny opening to a diverticulum, the bacteria inside can grow and cause an increase in pressure. This leads to infection and inflammation and is called diverticulitis. SYMPTOMS   Abdominal pain and tenderness. Usually, the pain is located on the left side of your abdomen. However, it could be located elsewhere.  Fever.  Bloating.  Feeling sick to your stomach (nausea).  Throwing up (vomiting).  Abnormal stools. DIAGNOSIS  Your caregiver will take a history and perform a physical exam. Since many things can cause abdominal pain, other tests may be necessary. Tests may include:  Blood tests.  Urine tests.  X-ray of the abdomen.  CT scan of the abdomen. Sometimes, surgery is needed to determine if diverticulitis or other conditions are causing your symptoms. TREATMENT  Most of the time, you can be treated without surgery. Treatment includes:  Resting the bowels by only having liquids for a few days. As you improve, you will need to eat a low-fiber diet.  Intravenous (IV) fluids if you are losing body fluids (dehydrated).  Antibiotic medicines that treat infections may be given.  Pain and nausea  medicine, if needed.  Surgery if the inflamed diverticulum has burst. HOME CARE INSTRUCTIONS   Try a clear liquid diet (broth, tea, or water for as long as directed by your caregiver). You may then gradually begin a low-fiber diet as tolerated. A low-fiber diet is a diet with less than 10 grams of fiber. Choose the foods below to reduce fiber in the diet:  White breads, cereals, rice, and pasta.  Cooked fruits and vegetables or soft fresh fruits and vegetables without the skin.  Ground or well-cooked tender beef, ham, veal, lamb, pork, or poultry.  Eggs and seafood.  After your diverticulitis symptoms have improved, your caregiver may put you on a high-fiber diet. A high-fiber diet includes 14 grams of fiber for every 1000 calories consumed. For a standard 2000 calorie diet, you would need 28 grams of fiber. Follow these diet guidelines to help you increase the fiber in your diet. It is important to slowly increase the amount fiber in your diet to avoid gas, constipation, and bloating.  Choose whole-grain breads, cereals, pasta, and brown rice.  Choose fresh fruits and vegetables with the skin on. Do not overcook vegetables because the more vegetables are cooked, the more fiber is lost.  Choose more nuts, seeds, legumes, dried peas, beans, and lentils.  Look for food products that have greater than 3 grams of fiber per serving on the Nutrition Facts label.  Take all medicine as directed by your caregiver.  If your caregiver has given you a follow-up appointment, it is very important that you go. Not going could result in lasting (  chronic) or permanent injury, pain, and disability. If there is any problem keeping the appointment, call to reschedule. SEEK MEDICAL CARE IF:   Your pain does not improve.  You have a hard time advancing your diet beyond clear liquids.  Your bowel movements do not return to normal. SEEK IMMEDIATE MEDICAL CARE IF:   Your pain becomes worse.  You have  an oral temperature above 102 F (38.9 C), not controlled by medicine.  You have repeated vomiting.  You have bloody or black, tarry stools.  Symptoms that brought you to your caregiver become worse or are not getting better. MAKE SURE YOU:   Understand these instructions.  Will watch your condition.  Will get help right away if you are not doing well or get worse. Document Released: 03/22/2005 Document Revised: 09/04/2011 Document Reviewed: 07/18/2010 [facility] Patient Information 2013 [place], [place].

## 2012-06-05 NOTE — Progress Notes (Signed)
  Subjective:    Patient ID: [name], female    DOB: 18-Mar-1961, 51 y.o.   MRN: [id number]  HPI Pt presents to clinic with complaints of wakening with abdominal discomfort that she initially thought was constipation.  Had no relief after bowel movement so presents for evaluation.  States that she has some bloating, denies mucus or blood in stool but states several years ago she had episode of blood in stools that resolved. No dysuria or vaginal discharge.  Hx is significant for hysterectomy but thinks that she may still have her ovaries. No prior colonoscopy. Has unprotected sex once a month with long term boyfriend.    Review of Systems  Constitutional: Negative for fever, chills and fatigue.  Respiratory: Negative for shortness of breath.   Cardiovascular: Negative for chest pain.  Gastrointestinal: Positive for abdominal pain. Negative for nausea, diarrhea, constipation, blood in stool and abdominal distention.  Genitourinary: Negative for dysuria, flank pain, vaginal discharge and vaginal pain.  Neurological: Negative for dizziness and headaches.       Objective:   Physical Exam  Constitutional: She is oriented to person, place, and time. She appears well-developed and well-nourished. No distress.  HENT:  Head: Normocephalic and atraumatic.  Cardiovascular: Normal rate, regular rhythm and normal heart sounds.   Pulmonary/Chest: Effort normal and breath sounds normal.  Abdominal: Soft. Bowel sounds are normal. There is tenderness. There is no rebound and no guarding.       Obese, tender to light palpation over LLQ/periumbilical area  Neurological: She is alert and oriented to person, place, and time.  Skin: Skin is warm and dry.  Psychiatric: She has a normal mood and affect.          Assessment & Plan:  1. Abdominal pain, LLQ: likely mild diverticulitis given crampy character, constipated sensation, bloating and LLQ tenderness, clinically doesn't appear to be ovarian  type pain, has bowel movements twice a day, will refer for colonoscopy after acute episode, denies nausea/vomiting, she is afebrile. -check CBC r/o systemic signs of infection -check U/A and urine GC/chlamydia -will treat with conservative management---> clear liquid diet with slow advancement over 2-3 days, ciprofloxacin 500 mg bid x 7 days and metronidazole 500 mg x 7 days -if symptoms worsens with signs of systemic infection consider CT abdomen which could demonstrate pericolic stranding due to inflammation, colonic diverticula, bowel wall thickening, soft tissue masses, phlegmon, and/or  Abscess.  2. Health Maintenance: will refer to GYN for annual follow-up, pt also needs mammogram

## 2012-06-05 NOTE — Assessment & Plan Note (Signed)
BP Readings from Last 3 Encounters:  06/05/12 119/76  04/29/12 138/84  12/01/11 102/66    Lab Results  Component Value Date   NA 142 03/22/2011   K 4.3 03/22/2011   CREATININE 0.60 03/22/2011    Assessment:  Blood pressure control: controlled  Progress toward BP goal:  at goal  Comments:   Plan:  Medications:  continue current medications  Educational resources provided: brochure  Self management tools provided: home blood pressure logbook  Other plans: cont lisinopril 20 mg qd

## 2012-06-06 LAB — URINALYSIS, ROUTINE W REFLEX MICROSCOPIC
Bilirubin Urine: NEGATIVE
Glucose, UA: NEGATIVE mg/dL
Hgb urine dipstick: NEGATIVE
Leukocytes, UA: NEGATIVE
Protein, ur: NEGATIVE mg/dL
Urobilinogen, UA: 1 mg/dL (ref 0.0–1.0)

## 2012-07-01 ENCOUNTER — Ambulatory Visit (INDEPENDENT_AMBULATORY_CARE_PROVIDER_SITE_OTHER): Payer: Self-pay | Admitting: Internal Medicine

## 2012-07-01 ENCOUNTER — Encounter: Payer: Self-pay | Admitting: Internal Medicine

## 2012-07-01 VITALS — BP 163/104 | HR 84 | Temp 98.6°F | Wt 141.6 lb

## 2012-07-01 DIAGNOSIS — I10 Essential (primary) hypertension: Secondary | ICD-10-CM

## 2012-07-01 DIAGNOSIS — M7710 Lateral epicondylitis, unspecified elbow: Secondary | ICD-10-CM | POA: Insufficient documentation

## 2012-07-01 MED ORDER — DICLOFENAC SODIUM 1 % TD GEL: 2.0000 g | Freq: Four times a day (QID) | TRANSDERMAL | Status: DC

## 2012-07-01 MED ORDER — ACETAMINOPHEN 500 MG PO TABS: 500.0000 mg | ORAL_TABLET | Freq: Four times a day (QID) | ORAL | Status: DC

## 2012-07-01 MED ORDER — NAPROXEN 375 MG PO TABS: 375.0000 mg | ORAL_TABLET | Freq: Three times a day (TID) | ORAL | Status: DC

## 2012-07-01 NOTE — Assessment & Plan Note (Signed)
Blood pressure elevated during visit this morning. Patient admits she has not taken her medications yet today. Last few visits with BP normal. Likely elevated due to noncompliance and pain. Encouraged patient to take all meds every morning.

## 2012-07-01 NOTE — Assessment & Plan Note (Signed)
Patient presents with symptoms most consistent with epicondylitis. Osteoarthritis highly unlikely in elbow as it is a non weight-bearing joint. No symptoms of RA or septic arthritis. No obvious bursitis over the elbow, however, if pain does not resolve, may benefit from sports medicine referral and potential ultrasound.   -DC meloxicam as this is not helping patient's pain -will treat with naproxen or ibuprofen OTC, voltaren gel (if patient can afford with her new insurance), as well as tylenol for pain relief -recommended taking time off work but as pt states this is not possible, we discussed behavior modification on the job. Giving her elbow rest, asking for help lifting heavy objects, anything that would exacerbation symptoms -also discussed using ice packs at night -patient will call back if not improved in 4-6 days and refer to sports medicine

## 2012-07-01 NOTE — Progress Notes (Signed)
Internal Medicine Clinic Visit    HPI:  [name] is a 52 y.o. year old female with a history of HTN, HLD, GERD, who presents with 2 weeks of L sided arm pain.  She states that she has had constant pain in her L elbow, radiates to shoulder and down to all 4 fingers, some associated tingling. Denies precipitating traumatic injury or history of injury to that arm. Pain is worse when using it at work. Better at home when resting. Disrupts work due to pain. 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 Patient thinks it is just arthritis since her parents had arthritis. Tried meloxicam without improvement, naproxen helped a little but she has not used this consistently. Her supervisor at work gave her an "arthritis cream" that helped a bit.  Denies weakness, no history of arthritis, no migrating joint pain, no red swollen joints, no fever, chills, no weight loss.  Reports that her mother had rheumatoid arthritis, father had some sort of arthritis as well.   She did not take her blood pressure medicine yet this morning.  Past Medical History  Diagnosis Date  . Hyperlipidemia     Past Surgical History  Procedure Date  . Abdominal hysterectomy 12/09    Secondary to fibroids  . Cesarean section      ROS:  A complete review of systems was otherwise negative, except as noted in the HPI.  Allergies: Aspirin and Hydromorphone hcl  Medications: Current Outpatient Prescriptions  Medication Sig Dispense Refill  . ferrous sulfate (IRON SUPPLEMENT) 325 (65 FE) MG tablet Take 325 mg by mouth 3 (three) times daily.       Marland Kitchen lisinopril (PRINIVIL,ZESTRIL) 20 MG tablet Take 1 tablet (20 mg total) by mouth daily.  30 tablet  5  . pravastatin (PRAVACHOL) 40 MG tablet Take 1 tablet (40 mg total) by mouth daily.  30 tablet  5  . albuterol (VENTOLIN HFA) 108 (90 BASE) MCG/ACT inhaler Inhale 1 puff into the lungs 4 (four) times daily as needed. For wheezing  1 Inhaler  0  .  benzonatate (TESSALON PERLES) 100 MG capsule Take 1 capsule (100 mg total) by mouth 3 (three) times daily as needed for cough.  20 capsule  0  . guaiFENesin-dextromethorphan (ROBITUSSIN DM) 100-10 MG/5ML syrup Take 5 mLs by mouth 3 (three) times daily as needed for cough.  118 mL  0  . meloxicam (MOBIC) 7.5 MG tablet Take 1 tablet (7.5 mg total) by mouth daily.  50 tablet  1  . oxymetazoline (AFRIN NASAL SPRAY) 0.05 % nasal spray A spray in each nostril BID PRN congestion or as needed for nose bleed but no longer than 5 days in a row.  30 mL  0  . ranitidine (ZANTAC) 150 MG tablet Take 1 tablet (150 mg total) by mouth 2 (two) times daily.  60 tablet  1    History   Social History  . Marital Status: Divorced    Spouse Name: N/A    Number of Children: N/A  . Years of Education: N/A   Occupational History  . 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 Smoking status: Current Every Day Smoker -- 0.3 packs/day    Types: Cigarettes  . Smokeless tobacco: Not on file  . Alcohol Use: Yes     Comment: occasionally  . Drug Use: No  . Sexually Active: Not  on file   Other Topics Concern  . Not on file   Social History Narrative   10 th grade education.    family history includes Cancer (age of onset:60) in her maternal aunt; Cancer (age of onset:65) in her mother; and Cancer (age of onset:69) in her father.  Physical Exam Blood pressure 158/100, pulse 84, temperature 98.6 F (37 C), temperature source Oral, weight 141 lb 9.6 oz (64.229 kg), SpO2 99.00%. General:  No acute distress, alert and oriented x 3, well-appearing  HEENT:  PERRL, EOMI, no lymphadenopathy, moist mucous membranes Cardiovascular:  Regular rate and rhythm, no murmurs Respiratory:  Clear to auscultation bilaterally, no wheezes, rales, or rhonchi Abdomen:  Soft, nondistended, nontender, normoactive bowel sounds Extremities:  Warm and well-perfused, no clubbing, cyanosis, or edema. No  hand deformities. Skin: Warm, dry, no rashes Neuro: Not anxious appearing, no depressed mood, normal affect MSK: pain with active > passive ROM. Crepitus noted. Pain worse with wrist extension. Point tenderness over lateral epicondyle. Sensation to light touch intact throughout. Joint not erythematous, no overlying cellulitis.  Labs: Lab Results  Component Value Date   CREATININE 0.60 03/22/2011   BUN 13 03/22/2011   NA 142 03/22/2011   K 4.3 03/22/2011   CL 107 03/22/2011   CO2 22 03/22/2011   Lab Results  Component Value Date   WBC 7.4 06/05/2012   HGB 10.5* 06/05/2012   HCT 33.8* 06/05/2012   MCV 72.4* 06/05/2012   PLT 297 06/05/2012      Assessment and Plan:    FOLLOWUP: [name] will follow back up in our clinic as needed. [name] knows to call out clinic in the meantime with any questions or new issues.   Patient was seen and evaluated by Denton Ar, MD,  and Dr. Rogelia Boga, MD Attending Physician.

## 2012-07-24 ENCOUNTER — Emergency Department (INDEPENDENT_AMBULATORY_CARE_PROVIDER_SITE_OTHER): Payer: BC Managed Care – PPO

## 2012-07-24 ENCOUNTER — Encounter (HOSPITAL_COMMUNITY): Payer: Self-pay | Admitting: *Deleted

## 2012-07-24 ENCOUNTER — Emergency Department (HOSPITAL_COMMUNITY)
Admission: EM | Admit: 2012-07-24 | Discharge: 2012-07-24 | Disposition: A | Discharge status: Home / Self Care | Payer: BC Managed Care – PPO | Source: Home / Self Care | Attending: Family Medicine | Admitting: Family Medicine

## 2012-07-24 DIAGNOSIS — M722 Plantar fascial fibromatosis: Secondary | ICD-10-CM

## 2012-07-24 NOTE — ED Notes (Signed)
Pt  Reports  l  Foot  Pain      X  2 -3  Weeks   denys  specefic  Injury  She  Reports  The  Pain is  Worse  On weight  Bearing       No  Obvious  Deformity  noted

## 2012-07-24 NOTE — ED Provider Notes (Signed)
History     CSN: [id number]  Arrival date & time 07/24/12  1502   First MD Initiated Contact with Patient 07/24/12 1515      Chief Complaint  Patient presents with  . Foot Pain    (Consider location/radiation/quality/duration/timing/severity/associated sxs/prior treatment) Patient is a 52 y.o. female presenting with lower extremity pain. The history is provided by the patient.  Foot Pain This is a new problem. The current episode started more than 1 week ago (2-3 weeks of left foot pain). The problem has been gradually worsening. The symptoms are aggravated by walking.    Past Medical History  Diagnosis Date  . Hyperlipidemia     Past Surgical History  Procedure Date  . Abdominal hysterectomy 12/09    Secondary to fibroids  . Cesarean section     Family History  Problem Relation Age of Onset  . Cancer Mother 61    pancreatic cancer  . Cancer Father 8    throat cancer  . Cancer Maternal Aunt 60    breast cancer    History  Substance Use Topics  . Smoking status: Current Every Day Smoker -- 0.3 packs/day    Types: Cigarettes  . Smokeless tobacco: Not on file  . Alcohol Use: Yes     Comment: occasionally    OB History    Grav Para Term Preterm Abortions TAB SAB Ect Mult Living                  Review of Systems  Constitutional: Negative.   Musculoskeletal: Positive for gait problem.    Allergies  Aspirin and Hydromorphone hcl  Home Medications   Current Outpatient Rx  Name  Route  Sig  Dispense  Refill  . ACETAMINOPHEN 500 MG PO TABS   Oral   Take 1 tablet (500 mg total) by mouth every 6 (six) hours.   30 tablet   0   . ALBUTEROL SULFATE HFA 108 (90 BASE) MCG/ACT IN AERS   Inhalation   Inhale 1 puff into the lungs 4 (four) times daily as needed. For wheezing   1 Inhaler   0     Lot #  [id number] Exp. Date [id number]  Patient has  ...   . BENZONATATE 100 MG PO CAPS   Oral   Take 1 capsule (100 mg total) by mouth 3 (three) times daily as  needed for cough.   20 capsule   0   . DICLOFENAC SODIUM 1 % TD GEL   Topical   Apply 2 g topically 4 (four) times daily.   1 Tube   1   . FERROUS SULFATE 325 (65 FE) MG PO TABS   Oral   Take 325 mg by mouth 3 (three) times daily.          . GUAIFENESIN-DM 100-10 MG/5ML PO SYRP   Oral   Take 5 mLs by mouth 3 (three) times daily as needed for cough.   118 mL   0   . LISINOPRIL 20 MG PO TABS   Oral   Take 1 tablet (20 mg total) by mouth daily.   30 tablet   5   . NAPROXEN 375 MG PO TABS   Oral   Take 1 tablet (375 mg total) by mouth 3 (three) times daily with meals.   60 tablet   2   . OXYMETAZOLINE HCL 0.05 % NA SOLN      A spray in each nostril BID PRN congestion or as  needed for nose bleed but no longer than 5 days in a row.   30 mL   0   . PRAVASTATIN SODIUM 40 MG PO TABS   Oral   Take 1 tablet (40 mg total) by mouth daily.   30 tablet   5   . RANITIDINE HCL 150 MG PO TABS   Oral   Take 1 tablet (150 mg total) by mouth 2 (two) times daily.   60 tablet   1     BP 158/91  Pulse 81  Temp 98.8 F (37.1 C) (Oral)  Resp 18  Ht 4\' 11"  (1.499 m)  Wt 142 lb (64.411 kg)  BMI 28.68 kg/m2  SpO2 98%  Physical Exam  Nursing note and vitals reviewed. Constitutional: She is oriented to person, place, and time. She appears well-developed and well-nourished.  Musculoskeletal: She exhibits tenderness.       Feet:  Neurological: She is alert and oriented to person, place, and time.  Skin: Skin is warm and dry.    ED Course  Procedures (including critical care time)  Labs Reviewed - No data to display Dg Foot 2 Views Left  07/24/2012  *RADIOLOGY REPORT*  Clinical Data: Medial pain with no known injury.  LEFT FOOT - 2 VIEW  Comparison: None.  Findings: There is some hallux valgus deformity with mild degenerative change at the metatarsal phalangeal joint of the great toe.  Soft tissues adjacent to that appear slightly thickened. 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 No other finding of note.  IMPRESSION: Some hallux valgus deformity.  Os tibiale externum.  See above discussion.   Original Report Authenticated By: Paulina Fusi, M.D.      1. Plantar fasciitis of left foot       MDM  X-rays reviewed and report per radiologist.         Linna Hoff, MD 07/24/12 [phone]

## 2012-08-29 ENCOUNTER — Telehealth: Payer: Self-pay | Admitting: *Deleted

## 2012-08-29 NOTE — Telephone Encounter (Signed)
Pt called with c/o headache,vomiting, diarrhea and chills.  Onset yesterday. She stayed out of work today  And wants to know what she should do. She is taking regular meds, nothing else.  Vomited X 2 today and diarrhea X3. She does not feel up to par. She is drinking fluids but not able to eat.   Offered appointment tomorrow but she wants to wait and see how she feels. Pt # [id number]

## 2012-08-29 NOTE — Telephone Encounter (Signed)
Spoke with the patient and she states that this illness started yesterday at work with some nausea, cough, chills, headache, and diarrhea.  This morning when she woke she had muscle aches and the vomiting as well.  She has been working to keep down liquids.  She did not get the flu shot.  Plan:  We will try to get her into be seen in the clinic tomorrow afternoon.  If this is the flu she is still inside the window for Tamiflu if she can be seen soon.    If her fever gets worse or she is unable to keep down fluids and notices her urine output is dropping off she was advised to be seen in Urgent care or the ED.  Leodis Sias, MD Internal Medicine Resident, PGY III Co-Chief Resident, Internal Medicine Pager: [phone] 08/29/2012 4:49 PM

## 2012-08-30 ENCOUNTER — Encounter: Payer: BC Managed Care – PPO | Admitting: Internal Medicine

## 2013-03-10 ENCOUNTER — Encounter (HOSPITAL_COMMUNITY): Payer: Self-pay | Admitting: *Deleted

## 2013-03-10 ENCOUNTER — Emergency Department (HOSPITAL_COMMUNITY): Payer: BC Managed Care – PPO

## 2013-03-10 ENCOUNTER — Emergency Department (HOSPITAL_COMMUNITY)
Admission: EM | Admit: 2013-03-10 | Discharge: 2013-03-10 | Disposition: A | Discharge status: Home / Self Care | Payer: BC Managed Care – PPO | Attending: Emergency Medicine | Admitting: Emergency Medicine

## 2013-03-10 DIAGNOSIS — S93402A Sprain of unspecified ligament of left ankle, initial encounter: Secondary | ICD-10-CM

## 2013-03-10 DIAGNOSIS — S8000XA Contusion of unspecified knee, initial encounter: Secondary | ICD-10-CM | POA: Insufficient documentation

## 2013-03-10 DIAGNOSIS — Y929 Unspecified place or not applicable: Secondary | ICD-10-CM | POA: Insufficient documentation

## 2013-03-10 DIAGNOSIS — S93409A Sprain of unspecified ligament of unspecified ankle, initial encounter: Secondary | ICD-10-CM | POA: Insufficient documentation

## 2013-03-10 DIAGNOSIS — S298XXA Other specified injuries of thorax, initial encounter: Secondary | ICD-10-CM | POA: Insufficient documentation

## 2013-03-10 DIAGNOSIS — E785 Hyperlipidemia, unspecified: Secondary | ICD-10-CM | POA: Insufficient documentation

## 2013-03-10 DIAGNOSIS — S8001XA Contusion of right knee, initial encounter: Secondary | ICD-10-CM

## 2013-03-10 DIAGNOSIS — W010XXA Fall on same level from slipping, tripping and stumbling without subsequent striking against object, initial encounter: Secondary | ICD-10-CM | POA: Insufficient documentation

## 2013-03-10 DIAGNOSIS — Z79899 Other long term (current) drug therapy: Secondary | ICD-10-CM | POA: Insufficient documentation

## 2013-03-10 DIAGNOSIS — F17200 Nicotine dependence, unspecified, uncomplicated: Secondary | ICD-10-CM | POA: Insufficient documentation

## 2013-03-10 DIAGNOSIS — Y9389 Activity, other specified: Secondary | ICD-10-CM | POA: Insufficient documentation

## 2013-03-10 DIAGNOSIS — S59909A Unspecified injury of unspecified elbow, initial encounter: Secondary | ICD-10-CM | POA: Insufficient documentation

## 2013-03-10 DIAGNOSIS — S6990XA Unspecified injury of unspecified wrist, hand and finger(s), initial encounter: Secondary | ICD-10-CM | POA: Insufficient documentation

## 2013-03-10 MED ORDER — IBUPROFEN 400 MG PO TABS
800.0000 mg | ORAL_TABLET | Freq: Once | ORAL | Status: AC
  Administered 2013-03-10: 800 mg via ORAL
  Filled 2013-03-10: qty 2

## 2013-03-10 MED ORDER — IBUPROFEN 800 MG PO TABS: 800.0000 mg | ORAL_TABLET | Freq: Three times a day (TID) | ORAL | Status: DC | PRN

## 2013-03-10 NOTE — ED Provider Notes (Signed)
CSN: [id number]     Arrival date & time 03/10/13  1357 History  This chart was scribed for Ebbie Ridge, PA, working with Ethelda Chick, MD by Blanchard Kelch, ED Scribe. This patient was seen in room TR09C/TR09C and the patient's care was started at 3:13 PM.    Chief Complaint  Patient presents with  . Fall  . Ankle Pain  . Wrist Pain    Patient is a 52 y.o. female presenting with fall, ankle pain, and wrist pain. The history is provided by the patient. No language interpreter was used.  Fall  Ankle Pain Wrist Pain    HPI Comments: [name] is a 52 y.o. female who presents to the Emergency Department complaining of a left ankle injury that occurred today when she tripped on a crack and fell. She has constant pain to the left ankle, right knee, right elbow and right posterior rib area. She denies any pertinent past medical history. She takes medications for GERD and high blood pressure.  Past Medical History  Diagnosis Date  . Hyperlipidemia    Past Surgical History  Procedure Laterality Date  . Abdominal hysterectomy  12/09    Secondary to fibroids  . Cesarean section     Family History  Problem Relation Age of Onset  . Cancer Mother 62    pancreatic cancer  . Cancer Father 54    throat cancer  . Cancer Maternal Aunt 60    breast cancer   History  Substance Use Topics  . Smoking status: Current Every Day Smoker -- 0.30 packs/day    Types: Cigarettes  . Smokeless tobacco: Not on file  . Alcohol Use: Yes     Comment: occasionally   OB History   Grav Para Term Preterm Abortions TAB SAB Ect Mult Living                 Review of Systems: A complete 10 system review of systems was obtained and all systems are negative except as noted in the HPI and PMH.    Allergies  Aspirin and Hydromorphone hcl  Home Medications   Current Outpatient Rx  Name  Route  Sig  Dispense  Refill  . ferrous sulfate (IRON SUPPLEMENT) 325 (65 FE) MG tablet   Oral   Take 325  mg by mouth 3 (three) times daily.          Marland Kitchen lisinopril (PRINIVIL,ZESTRIL) 20 MG tablet   Oral   Take 1 tablet (20 mg total) by mouth daily.   30 tablet   5   . pravastatin (PRAVACHOL) 40 MG tablet   Oral   Take 1 tablet (40 mg total) by mouth daily.   30 tablet   5    Triage Vitals: BP 134/85  Pulse 86  Temp(Src) 98.2 F (36.8 C) (Oral)  Resp 18  SpO2 100%  Physical Exam  Nursing note and vitals reviewed. Constitutional: She is oriented to person, place, and time. She appears well-developed and well-nourished. No distress.  HENT:  Head: Normocephalic and atraumatic.  Eyes: EOM are normal.  Neck: Neck supple. No tracheal deviation present.  Cardiovascular: Normal rate.   Pulmonary/Chest: Effort normal. No respiratory distress.  Musculoskeletal: Normal range of motion.  Tenderness to palpation right anterior knee and lateral left malleolus.   Neurological: She is alert and oriented to person, place, and time.  Skin: Skin is warm and dry.  Psychiatric: She has a normal mood and affect. Her behavior is normal.  ED Course  Procedures (including critical care time)  DIAGNOSTIC STUDIES: Oxygen Saturation is 100% on room air, noraml by my interpretation.    COORDINATION OF CARE:  3:13 PM - Patient verbalizes understanding and agrees with treatment plan.  Return here as needed.  Followup with the orthopedist provided  MDM  I personally performed the services described in this documentation, which was scribed in my presence. The recorded information has been reviewed and is accurate.   Carlyle Dolly, PA-C 03/10/13 [phone]

## 2013-03-10 NOTE — ED Notes (Signed)
Pt stepped in a hole causing her to fall. C/o pain right lateral rib area and left ankle.

## 2013-03-10 NOTE — ED Notes (Signed)
Pt tripped over a separation area and her left foot went over.  Pt has left ankle ankle, right knee, right elbow, and right posterior rib area.  No LOC or head injury

## 2013-03-10 NOTE — ED Provider Notes (Signed)
Medical screening examination/treatment/procedure(s) were performed by non-physician practitioner and as supervising physician I was immediately available for consultation/collaboration.  Anyla Israelson L Carley Strickling, MD 03/10/13 1650 

## 2013-03-10 NOTE — Progress Notes (Signed)
Orthopedic Tech Progress Note Patient Details:  [name] December 14, 1960 [id number]  Ortho Devices Type of Ortho Device: Crutches Ortho Device/Splint Interventions: Ordered;Adjustment   Jennye Moccasin 03/10/2013, 5:02 PM

## 2013-03-10 NOTE — ED Notes (Signed)
Patient transported to X-ray 

## 2013-03-29 ENCOUNTER — Encounter (HOSPITAL_COMMUNITY): Payer: Self-pay

## 2013-03-29 ENCOUNTER — Emergency Department (HOSPITAL_COMMUNITY): Payer: BC Managed Care – PPO

## 2013-03-29 ENCOUNTER — Emergency Department (HOSPITAL_COMMUNITY)
Admission: EM | Admit: 2013-03-29 | Discharge: 2013-03-29 | Disposition: A | Discharge status: Home / Self Care | Payer: BC Managed Care – PPO | Attending: Emergency Medicine | Admitting: Emergency Medicine

## 2013-03-29 DIAGNOSIS — S40012A Contusion of left shoulder, initial encounter: Secondary | ICD-10-CM

## 2013-03-29 DIAGNOSIS — Z23 Encounter for immunization: Secondary | ICD-10-CM | POA: Insufficient documentation

## 2013-03-29 DIAGNOSIS — H1130 Conjunctival hemorrhage, unspecified eye: Secondary | ICD-10-CM | POA: Insufficient documentation

## 2013-03-29 DIAGNOSIS — S0003XA Contusion of scalp, initial encounter: Secondary | ICD-10-CM | POA: Insufficient documentation

## 2013-03-29 DIAGNOSIS — E785 Hyperlipidemia, unspecified: Secondary | ICD-10-CM | POA: Insufficient documentation

## 2013-03-29 DIAGNOSIS — S0510XA Contusion of eyeball and orbital tissues, unspecified eye, initial encounter: Secondary | ICD-10-CM | POA: Insufficient documentation

## 2013-03-29 DIAGNOSIS — S0083XA Contusion of other part of head, initial encounter: Secondary | ICD-10-CM

## 2013-03-29 DIAGNOSIS — H1132 Conjunctival hemorrhage, left eye: Secondary | ICD-10-CM

## 2013-03-29 DIAGNOSIS — Z79899 Other long term (current) drug therapy: Secondary | ICD-10-CM | POA: Insufficient documentation

## 2013-03-29 DIAGNOSIS — H44812 Hemophthalmos, left eye: Secondary | ICD-10-CM

## 2013-03-29 DIAGNOSIS — S0592XA Unspecified injury of left eye and orbit, initial encounter: Secondary | ICD-10-CM

## 2013-03-29 DIAGNOSIS — S40019A Contusion of unspecified shoulder, initial encounter: Secondary | ICD-10-CM | POA: Insufficient documentation

## 2013-03-29 DIAGNOSIS — R42 Dizziness and giddiness: Secondary | ICD-10-CM | POA: Insufficient documentation

## 2013-03-29 DIAGNOSIS — S298XXA Other specified injuries of thorax, initial encounter: Secondary | ICD-10-CM | POA: Insufficient documentation

## 2013-03-29 DIAGNOSIS — F17200 Nicotine dependence, unspecified, uncomplicated: Secondary | ICD-10-CM | POA: Insufficient documentation

## 2013-03-29 DIAGNOSIS — S0990XA Unspecified injury of head, initial encounter: Secondary | ICD-10-CM | POA: Insufficient documentation

## 2013-03-29 LAB — CBC WITH DIFFERENTIAL/PLATELET
Basophils Absolute: 0 10*3/uL (ref 0.0–0.1)
Eosinophils Relative: 0 % (ref 0–5)
HCT: 34.2 % — ABNORMAL LOW (ref 36.0–46.0)
Hemoglobin: 11 g/dL — ABNORMAL LOW (ref 12.0–15.0)
Lymphocytes Relative: 10 % — ABNORMAL LOW (ref 12–46)
Lymphs Abs: 1.1 10*3/uL (ref 0.7–4.0)
MCV: 71.4 fL — ABNORMAL LOW (ref 78.0–100.0)
Monocytes Absolute: 0.8 10*3/uL (ref 0.1–1.0)
Neutro Abs: 8.9 10*3/uL — ABNORMAL HIGH (ref 1.7–7.7)
RBC: 4.79 MIL/uL (ref 3.87–5.11)
RDW: 14 % (ref 11.5–15.5)
WBC: 10.9 10*3/uL — ABNORMAL HIGH (ref 4.0–10.5)

## 2013-03-29 LAB — POCT I-STAT, CHEM 8
BUN: 9 mg/dL (ref 6–23)
Calcium, Ion: 1.15 mmol/L (ref 1.12–1.23)
Chloride: 106 mEq/L (ref 96–112)
Creatinine, Ser: 0.8 mg/dL (ref 0.50–1.10)

## 2013-03-29 MED ORDER — PREDNISONE 10 MG PO TABS: ORAL_TABLET | ORAL | Status: DC

## 2013-03-29 MED ORDER — FENTANYL CITRATE 0.05 MG/ML IJ SOLN
50.0000 ug | Freq: Once | INTRAMUSCULAR | Status: AC
  Administered 2013-03-29: 50 ug via INTRAVENOUS
  Filled 2013-03-29: qty 2

## 2013-03-29 MED ORDER — TETANUS-DIPHTH-ACELL PERTUSSIS 5-2.5-18.5 LF-MCG/0.5 IM SUSP
0.5000 mL | Freq: Once | INTRAMUSCULAR | Status: AC
  Administered 2013-03-29: 0.5 mL via INTRAMUSCULAR
  Filled 2013-03-29: qty 0.5

## 2013-03-29 MED ORDER — AMOXICILLIN 500 MG PO CAPS
500.0000 mg | ORAL_CAPSULE | Freq: Three times a day (TID) | ORAL | Status: DC
  Administered 2013-03-29: 500 mg via ORAL
  Filled 2013-03-29: qty 1

## 2013-03-29 MED ORDER — OXYCODONE-ACETAMINOPHEN 5-325 MG PO TABS: 1.0000 | ORAL_TABLET | ORAL | Status: DC | PRN

## 2013-03-29 MED ORDER — AMOXICILLIN 500 MG PO CAPS: 500.0000 mg | ORAL_CAPSULE | Freq: Three times a day (TID) | ORAL | Status: DC

## 2013-03-29 MED ORDER — OXYCODONE-ACETAMINOPHEN 5-325 MG PO TABS
1.0000 | ORAL_TABLET | Freq: Once | ORAL | Status: AC
  Administered 2013-03-29: 1 via ORAL
  Filled 2013-03-29: qty 1

## 2013-03-29 MED ORDER — FENTANYL CITRATE 0.05 MG/ML IJ SOLN: 50.0000 ug | Freq: Once | INTRAMUSCULAR | Status: DC

## 2013-03-29 MED ORDER — ONDANSETRON HCL 4 MG/2ML IJ SOLN
4.0000 mg | Freq: Once | INTRAMUSCULAR | Status: AC
  Administered 2013-03-29: 4 mg via INTRAVENOUS
  Filled 2013-03-29: qty 2

## 2013-03-29 MED ORDER — PREDNISONE 20 MG PO TABS: 60.0000 mg | ORAL_TABLET | Freq: Every day | ORAL | Status: DC

## 2013-03-29 MED ORDER — TETRACAINE HCL 0.5 % OP SOLN
2.0000 [drp] | Freq: Once | OPHTHALMIC | Status: AC
  Administered 2013-03-29: 2 [drp] via OPHTHALMIC
  Filled 2013-03-29: qty 2

## 2013-03-29 NOTE — ED Notes (Signed)
Pt given phone so she can call her ride.
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
## 2013-03-30 NOTE — ED Provider Notes (Signed)
Medical screening examination/treatment/procedure(s) were performed by non-physician practitioner and as supervising physician I was immediately available for consultation/collaboration.    Celene Kras, MD 03/30/13 [phone]

## 2013-04-30 ENCOUNTER — Ambulatory Visit (INDEPENDENT_AMBULATORY_CARE_PROVIDER_SITE_OTHER): Payer: Self-pay | Admitting: Internal Medicine

## 2013-04-30 ENCOUNTER — Encounter: Payer: Self-pay | Admitting: Internal Medicine

## 2013-04-30 VITALS — BP 117/78 | HR 83 | Temp 98.3°F | Ht 59.0 in | Wt 144.0 lb

## 2013-04-30 DIAGNOSIS — Z0000 Encounter for general adult medical examination without abnormal findings: Secondary | ICD-10-CM

## 2013-04-30 DIAGNOSIS — E785 Hyperlipidemia, unspecified: Secondary | ICD-10-CM

## 2013-04-30 DIAGNOSIS — R112 Nausea with vomiting, unspecified: Secondary | ICD-10-CM

## 2013-04-30 LAB — CBC WITH DIFFERENTIAL/PLATELET
Basophils Absolute: 0 10*3/uL (ref 0.0–0.1)
Basophils Relative: 0 % (ref 0–1)
MCHC: 31 g/dL (ref 30.0–36.0)
Neutro Abs: 3.2 10*3/uL (ref 1.7–7.7)
Neutrophils Relative %: 55 % (ref 43–77)
RDW: 15.8 % — ABNORMAL HIGH (ref 11.5–15.5)
WBC: 5.9 10*3/uL (ref 4.0–10.5)

## 2013-04-30 LAB — COMPLETE METABOLIC PANEL WITH GFR
Albumin: 4.1 g/dL (ref 3.5–5.2)
BUN: 13 mg/dL (ref 6–23)
CO2: 25 mEq/L (ref 19–32)
GFR, Est African American: >89 mL/min
GFR, Est Non African American: >89 mL/min
Glucose, Bld: 81 mg/dL (ref 70–99)
Potassium: 4 mEq/L (ref 3.5–5.3)
Sodium: 141 mEq/L (ref 135–145)
Total Bilirubin: 0.2 mg/dL — ABNORMAL LOW (ref 0.3–1.2)
Total Protein: 6.8 g/dL (ref 6.0–8.3)

## 2013-04-30 LAB — LIPID PANEL
Cholesterol: 210 mg/dL — ABNORMAL HIGH (ref 0–200)
Triglycerides: 191 mg/dL — ABNORMAL HIGH (ref <150)

## 2013-04-30 MED ORDER — ONDANSETRON HCL 4 MG PO TABS: 4.0000 mg | ORAL_TABLET | Freq: Three times a day (TID) | ORAL | Status: DC | PRN

## 2013-04-30 NOTE — Progress Notes (Signed)
Patient ID: [name], female   DOB: [birth year], 52 y.o.   MRN: [id number]   Subjective:   Patient ID: [name] female   DOB: October 12, 1960 52 y.o.   MRN: [id number]  HPI: [name] is a 52 y.o. past medical history of hypertension ,asthma , anemia, cocaine abuse and hyperlipidemia. Presented today with complaints of chills, headache and vomiting which all started this morning. Patient has had 3 episodes of vomiting to be which consists of recently ingested meals. Patient also experienced some chills but no fever, but she didn't take her temperature at home. Headache is mostly frontal, patient has not taking anything for her ailments.  No photophobia, no neck pain or stiffness, no body pains, no abdominal pains, no loose stools, no cough, shortness of breath, no sore throat, no known sick contacts, no known history of food poisoning, has been eating home-cooked meals, patient has not had the flu shot, also no recent travels.    Past Medical History  Diagnosis Date  . Hyperlipidemia    Current Outpatient Prescriptions  Medication Sig Dispense Refill  . amoxicillin (AMOXIL) 500 MG capsule Take 1 capsule (500 mg total) by mouth 3 (three) times daily.  21 capsule  0  . ferrous sulfate (IRON SUPPLEMENT) 325 (65 FE) MG tablet Take 325 mg by mouth 3 (three) times daily.       Marland Kitchen ibuprofen (ADVIL,MOTRIN) 800 MG tablet Take 1 tablet (800 mg total) by mouth every 8 (eight) hours as needed for pain.  21 tablet  0  . lisinopril (PRINIVIL,ZESTRIL) 20 MG tablet Take 1 tablet (20 mg total) by mouth daily.  30 tablet  5  . omeprazole (PRILOSEC) 20 MG capsule Take 20 mg by mouth daily.      . ondansetron (ZOFRAN) 4 MG tablet Take 1 tablet (4 mg total) by mouth every 8 (eight) hours as needed for nausea or vomiting.  12 tablet  0  . oxyCODONE-acetaminophen (PERCOCET) 5-325 MG per tablet Take 1 tablet by mouth every 4 (four) hours as needed for pain.  20 tablet  0  . pravastatin (PRAVACHOL)  40 MG tablet Take 1 tablet (40 mg total) by mouth daily.  30 tablet  5  . predniSONE (DELTASONE) 10 MG tablet Take 5 tab day 1, take 4 tab day 2, take 3 tab day 3, take 2 tab day 4, and take 1 tab day 5  15 tablet  0   No current facility-administered medications for this visit.   Family History  Problem Relation Age of Onset  . Cancer Mother 74    pancreatic cancer  . Cancer Father 28    throat cancer  . Cancer Maternal Aunt 60    breast cancer   History   Social History  . Marital Status: Divorced    Spouse Name: N/A    Number of Children: N/A  . Years of Education: N/A   Occupational History  . 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 Smoking status: Current Every Day Smoker -- 0.30 packs/day    Types: Cigarettes  . Smokeless tobacco: None  . Alcohol Use: Yes     Comment: occasionally  . Drug Use: No  . Sexual Activity: None   Other Topics Concern  . None   Social History Narrative   10 th grade education.   Review of Systems  Review of system as per HPI.  Objective:  Physical Exam: Filed Vitals:   04/30/13 1342  BP: 117/78  Pulse: 83  Temp: 98.3 F (36.8 C)  TempSrc: Oral  Height: 4\' 11"  (1.499 m)  Weight: 144 lb (65.318 kg)  SpO2: 99%   GENERAL- alert, co-operative, appears as stated age, not in any distress. HEENT- Atraumatic, normocephalic, PERRL, EOMI, oral mucosa appears moist, no cervical LN enlargement, thyroid does not appear enlarged, neck supple. CARDIAC- RRR, no murmurs, rubs or gallops. RESP- Moving equal volumes of air, and clear to auscultation bilaterally. ABDOMEN- Soft ,non tender, no palpable masses or organomegaly, bowel sounds present. BACK- Normal curvature of the spine, No tenderness along the vertebrae, no CVA tenderness. NEURO- No obvious Cranial nerve deficit. EXTREMITIES- pulse 2+, symmetric, no pedal edema. SKIN- Warm, dry, No rash or lesion. PSYCH- Normal mood and affect, appropriate  thought content and speech.  Assessment & Plan:  The patient's case and plan of care was discussed with attending physician, Dr. Dorris Singh.  Vomiting, chills and headache- of one day duration, possibly evolving symptoms. Considerations at this time for viral infection.  - CBC - Blood cultures - CMP - Zofran 4 mg 3 times a day, for 3 days - Take Tylenol for headache relief. - Will give patient's flu shot when these symptoms have resolved.   Please see the rest of the assessment and plan under problem based chatting.

## 2013-04-30 NOTE — Assessment & Plan Note (Signed)
Assessment- Last lipid profiled done- 03/22/2011 showed an LDL of 80.  Plan- repeat lipid profile today.

## 2013-04-30 NOTE — Patient Instructions (Signed)
We will be doing some blood tests on you today. We will let you know the results when they are ready. We think this is just a virus or stomach bug, and these usually pass away with time. We will prescribe a medication called ondansetron for your Vomiting. Please also take tylenol for your headache. If you feel worse you can come back or go the Emergency department.

## 2013-04-30 NOTE — Assessment & Plan Note (Signed)
-   Patient's insurance kick in January 2015, patient should then be scheduled for a mammography and colonoscopy.  - Patient has had a hysterectomy. - Flu shot- next visit, she is currently presenting with headache, vomiting.

## 2013-05-01 ENCOUNTER — Telehealth: Payer: Self-pay | Admitting: *Deleted

## 2013-05-01 NOTE — Progress Notes (Signed)
I saw and evaluated the patient.  I personally confirmed the key portions of the history and exam documented by Dr. Emokpae and I reviewed pertinent patient test results.  The assessment, diagnosis, and plan were formulated together and I agree with the documentation in the resident's note. 

## 2013-05-01 NOTE — Telephone Encounter (Signed)
i have never seen the pt so I cannot help.

## 2013-05-01 NOTE — Telephone Encounter (Signed)
Pt calls and states she now has diarrhea, h/a and more congestion

## 2013-05-06 LAB — CULTURE, BLOOD (SINGLE): Organism ID, Bacteria: NO GROWTH

## 2013-05-12 ENCOUNTER — Ambulatory Visit: Payer: Self-pay | Admitting: Internal Medicine

## 2013-05-19 ENCOUNTER — Other Ambulatory Visit: Payer: Self-pay | Admitting: *Deleted

## 2013-05-19 ENCOUNTER — Telehealth: Payer: Self-pay | Admitting: *Deleted

## 2013-05-19 ENCOUNTER — Encounter: Payer: Self-pay | Admitting: Internal Medicine

## 2013-05-19 ENCOUNTER — Ambulatory Visit: Payer: Self-pay | Admitting: Internal Medicine

## 2013-05-19 MED ORDER — LISINOPRIL 20 MG PO TABS: 20.0000 mg | ORAL_TABLET | Freq: Every day | ORAL | Status: DC

## 2013-05-19 MED ORDER — OMEPRAZOLE 20 MG PO CPDR: 20.0000 mg | DELAYED_RELEASE_CAPSULE | Freq: Every day | ORAL | Status: DC

## 2013-05-19 MED ORDER — PRAVASTATIN SODIUM 40 MG PO TABS: 40.0000 mg | ORAL_TABLET | Freq: Every day | ORAL | Status: DC

## 2013-05-19 NOTE — Telephone Encounter (Signed)
review 

## 2013-05-19 NOTE — Telephone Encounter (Signed)
Pt calls and states she has not been able to get med prescribed 11/5- zofran #12, she states pharm told her they didn't have script for her and she has not called to clinic to check on script, she is calling now because she is severly having acid reflux to the point she is in bed. It is highly encouraged by triage to be seen today or go to urg care or ED, she refuses. Triage called [facility] and spoke w/ pharmacist, she states pt has 5 profiles and they did receive script and it is on hold on that profile. They are filling it now. Pt is ask to have pharmacy combine profiles. She is agreeable and will send someone for it, she states she wants to try the zofran and finally agrees to a wed appt w/ dr Mariea Clonts.

## 2013-05-19 NOTE — Telephone Encounter (Signed)
I sent message to front desk to sch appt with me as I have never met her

## 2013-05-19 NOTE — Telephone Encounter (Signed)
Thank you :)

## 2013-05-21 ENCOUNTER — Ambulatory Visit: Payer: Self-pay | Admitting: Internal Medicine

## 2013-05-21 ENCOUNTER — Encounter: Payer: Self-pay | Admitting: Internal Medicine

## 2013-06-16 ENCOUNTER — Telehealth: Payer: Self-pay | Admitting: *Deleted

## 2013-06-16 NOTE — Telephone Encounter (Signed)
Pt called - c/o of pain upper abd past 2 weeks - worse with certain food. Pt is concerned mother has stomach ca.Pt still on Prilosec. Appt made 06/23/13 8:15AM Dr Manson Passey. Message left on home ID recording about appt. If any change - suggest ER. Has no GI doctor. Stanton Kidney Tauno Falotico RN 06/16/13 12:30PM

## 2013-06-23 ENCOUNTER — Ambulatory Visit: Payer: Self-pay | Admitting: Internal Medicine

## 2013-07-10 ENCOUNTER — Observation Stay (HOSPITAL_COMMUNITY)
Admission: EM | Admit: 2013-07-10 | Discharge: 2013-07-11 | Disposition: A | Discharge status: Home / Self Care | Payer: No Typology Code available for payment source | Attending: Internal Medicine | Admitting: Internal Medicine

## 2013-07-10 ENCOUNTER — Encounter (HOSPITAL_COMMUNITY): Payer: Self-pay | Admitting: Emergency Medicine

## 2013-07-10 ENCOUNTER — Emergency Department (HOSPITAL_COMMUNITY): Payer: No Typology Code available for payment source

## 2013-07-10 DIAGNOSIS — K219 Gastro-esophageal reflux disease without esophagitis: Secondary | ICD-10-CM | POA: Insufficient documentation

## 2013-07-10 DIAGNOSIS — R071 Chest pain on breathing: Principal | ICD-10-CM | POA: Insufficient documentation

## 2013-07-10 DIAGNOSIS — I10 Essential (primary) hypertension: Secondary | ICD-10-CM | POA: Insufficient documentation

## 2013-07-10 DIAGNOSIS — R110 Nausea: Secondary | ICD-10-CM | POA: Insufficient documentation

## 2013-07-10 DIAGNOSIS — F17200 Nicotine dependence, unspecified, uncomplicated: Secondary | ICD-10-CM | POA: Insufficient documentation

## 2013-07-10 DIAGNOSIS — R079 Chest pain, unspecified: Secondary | ICD-10-CM | POA: Diagnosis present

## 2013-07-10 DIAGNOSIS — D509 Iron deficiency anemia, unspecified: Secondary | ICD-10-CM

## 2013-07-10 DIAGNOSIS — R0602 Shortness of breath: Secondary | ICD-10-CM | POA: Insufficient documentation

## 2013-07-10 DIAGNOSIS — K449 Diaphragmatic hernia without obstruction or gangrene: Secondary | ICD-10-CM | POA: Insufficient documentation

## 2013-07-10 DIAGNOSIS — Z23 Encounter for immunization: Secondary | ICD-10-CM | POA: Insufficient documentation

## 2013-07-10 DIAGNOSIS — E785 Hyperlipidemia, unspecified: Secondary | ICD-10-CM | POA: Insufficient documentation

## 2013-07-10 DIAGNOSIS — D649 Anemia, unspecified: Secondary | ICD-10-CM

## 2013-07-10 DIAGNOSIS — Z79899 Other long term (current) drug therapy: Secondary | ICD-10-CM | POA: Insufficient documentation

## 2013-07-10 HISTORY — DX: Gastro-esophageal reflux disease without esophagitis: K21.9

## 2013-07-10 HISTORY — DX: Essential (primary) hypertension: I10

## 2013-07-10 HISTORY — DX: Diaphragmatic hernia without obstruction or gangrene: K44.9

## 2013-07-10 LAB — CBC
HCT: 31.1 % — ABNORMAL LOW (ref 36.0–46.0)
Hemoglobin: 9.9 g/dL — ABNORMAL LOW (ref 12.0–15.0)
MCH: 22.5 pg — ABNORMAL LOW (ref 26.0–34.0)
MCHC: 31.8 g/dL (ref 30.0–36.0)
MCV: 70.7 fL — AB (ref 78.0–100.0)
Platelets: 248 10*3/uL (ref 150–400)
RBC: 4.4 MIL/uL (ref 3.87–5.11)
RDW: 13.4 % (ref 11.5–15.5)
WBC: 4.3 10*3/uL (ref 4.0–10.5)

## 2013-07-10 LAB — URINALYSIS, ROUTINE W REFLEX MICROSCOPIC
Bilirubin Urine: NEGATIVE
GLUCOSE, UA: NEGATIVE mg/dL
Hgb urine dipstick: NEGATIVE
Ketones, ur: NEGATIVE mg/dL
LEUKOCYTES UA: NEGATIVE
Nitrite: NEGATIVE
PH: 5.5 (ref 5.0–8.0)
PROTEIN: NEGATIVE mg/dL
SPECIFIC GRAVITY, URINE: 1.019 (ref 1.005–1.030)
Urobilinogen, UA: 0.2 mg/dL (ref 0.0–1.0)

## 2013-07-10 LAB — COMPREHENSIVE METABOLIC PANEL
ALBUMIN: 3.6 g/dL (ref 3.5–5.2)
ALT: 23 U/L (ref 0–35)
AST: 18 U/L (ref 0–37)
Alkaline Phosphatase: 53 U/L (ref 39–117)
BILIRUBIN TOTAL: 0.2 mg/dL — AB (ref 0.3–1.2)
BUN: 10 mg/dL (ref 6–23)
CO2: 24 mEq/L (ref 19–32)
CREATININE: 0.54 mg/dL (ref 0.50–1.10)
Calcium: 8.8 mg/dL (ref 8.4–10.5)
Chloride: 104 mEq/L (ref 96–112)
GFR calc Af Amer: >90 mL/min (ref >90)
GFR calc non Af Amer: >90 mL/min (ref >90)
Glucose, Bld: 91 mg/dL (ref 70–99)
Potassium: 3.7 mEq/L (ref 3.7–5.3)
Sodium: 141 mEq/L (ref 137–147)
TOTAL PROTEIN: 6.8 g/dL (ref 6.0–8.3)

## 2013-07-10 LAB — POCT I-STAT TROPONIN I
TROPONIN I, POC: 0 ng/mL (ref 0.00–0.08)
Troponin i, poc: 0 ng/mL (ref 0.00–0.08)

## 2013-07-10 LAB — RAPID URINE DRUG SCREEN, HOSP PERFORMED
AMPHETAMINES: NOT DETECTED
Barbiturates: NOT DETECTED
Benzodiazepines: NOT DETECTED
Cocaine: NOT DETECTED
Opiates: NOT DETECTED
Tetrahydrocannabinol: NOT DETECTED

## 2013-07-10 LAB — RETICULOCYTES
RBC.: 4.29 MIL/uL (ref 3.87–5.11)
RETIC COUNT ABSOLUTE: 34.3 10*3/uL (ref 19.0–186.0)
RETIC CT PCT: 0.8 % (ref 0.4–3.1)

## 2013-07-10 LAB — LIPASE, BLOOD: Lipase: 19 U/L (ref 11–59)

## 2013-07-10 LAB — TROPONIN I: Troponin I: <0.3 ng/mL (ref <0.30)

## 2013-07-10 MED ORDER — SIMVASTATIN 20 MG PO TABS
20.0000 mg | ORAL_TABLET | Freq: Every day | ORAL | Status: DC
  Administered 2013-07-11: 20 mg via ORAL
  Filled 2013-07-10: qty 1

## 2013-07-10 MED ORDER — ACETAMINOPHEN 325 MG PO TABS
650.0000 mg | ORAL_TABLET | Freq: Four times a day (QID) | ORAL | Status: DC | PRN
  Administered 2013-07-10 – 2013-07-11 (×2): 650 mg via ORAL
  Filled 2013-07-10 (×2): qty 2

## 2013-07-10 MED ORDER — ONDANSETRON HCL 4 MG/2ML IJ SOLN
4.0000 mg | Freq: Once | INTRAMUSCULAR | Status: AC
  Administered 2013-07-10: 4 mg via INTRAVENOUS
  Filled 2013-07-10: qty 2

## 2013-07-10 MED ORDER — FERROUS SULFATE 325 (65 FE) MG PO TABS
325.0000 mg | ORAL_TABLET | Freq: Three times a day (TID) | ORAL | Status: DC
  Administered 2013-07-10 – 2013-07-11 (×3): 325 mg via ORAL
  Filled 2013-07-10 (×4): qty 1

## 2013-07-10 MED ORDER — PNEUMOCOCCAL VAC POLYVALENT 25 MCG/0.5ML IJ INJ
0.5000 mL | INJECTION | INTRAMUSCULAR | Status: AC
  Administered 2013-07-11: 0.5 mL via INTRAMUSCULAR
  Filled 2013-07-10: qty 0.5

## 2013-07-10 MED ORDER — CARVEDILOL 3.125 MG PO TABS
3.1250 mg | ORAL_TABLET | Freq: Two times a day (BID) | ORAL | Status: DC
  Filled 2013-07-10 (×3): qty 1

## 2013-07-10 MED ORDER — ACETAMINOPHEN 650 MG RE SUPP
650.0000 mg | Freq: Four times a day (QID) | RECTAL | Status: DC | PRN
Start: 2013-07-10 — End: 2013-07-11

## 2013-07-10 MED ORDER — PANTOPRAZOLE SODIUM 40 MG PO TBEC
40.0000 mg | DELAYED_RELEASE_TABLET | Freq: Every day | ORAL | Status: DC
  Administered 2013-07-10 – 2013-07-11 (×2): 40 mg via ORAL
  Filled 2013-07-10 (×2): qty 1

## 2013-07-10 MED ORDER — MORPHINE SULFATE 4 MG/ML IJ SOLN
4.0000 mg | Freq: Once | INTRAMUSCULAR | Status: AC
Start: 2013-07-10 — End: 2013-07-10
  Administered 2013-07-10: 4 mg via INTRAVENOUS
  Filled 2013-07-10: qty 1

## 2013-07-10 MED ORDER — LORAZEPAM 2 MG/ML IJ SOLN: 1.0000 mg | Freq: Once | INTRAMUSCULAR | Status: DC

## 2013-07-10 MED ORDER — SODIUM CHLORIDE 0.9 % IJ SOLN
3.0000 mL | Freq: Two times a day (BID) | INTRAMUSCULAR | Status: DC
  Administered 2013-07-10 – 2013-07-11 (×2): 3 mL via INTRAVENOUS

## 2013-07-10 MED ORDER — MORPHINE SULFATE 4 MG/ML IJ SOLN
4.0000 mg | Freq: Once | INTRAMUSCULAR | Status: AC
  Administered 2013-07-10: 4 mg via INTRAVENOUS
  Filled 2013-07-10: qty 1

## 2013-07-10 MED ORDER — HEPARIN SODIUM (PORCINE) 5000 UNIT/ML IJ SOLN
5000.0000 [IU] | Freq: Three times a day (TID) | INTRAMUSCULAR | Status: DC
  Administered 2013-07-10 – 2013-07-11 (×2): 5000 [IU] via SUBCUTANEOUS
  Filled 2013-07-10 (×5): qty 1

## 2013-07-10 MED ORDER — SODIUM CHLORIDE 0.9 % IJ SOLN: 3.0000 mL | INTRAMUSCULAR | Status: DC | PRN

## 2013-07-10 MED ORDER — MORPHINE SULFATE 2 MG/ML IJ SOLN: 2.0000 mg | INTRAMUSCULAR | Status: DC | PRN

## 2013-07-10 MED ORDER — LISINOPRIL 20 MG PO TABS
20.0000 mg | ORAL_TABLET | Freq: Every day | ORAL | Status: DC
  Administered 2013-07-10 – 2013-07-11 (×2): 20 mg via ORAL
  Filled 2013-07-10 (×2): qty 1

## 2013-07-10 MED ORDER — SODIUM CHLORIDE 0.9 % IV SOLN: 250.0000 mL | INTRAVENOUS | Status: DC | PRN

## 2013-07-10 MED ORDER — NITROGLYCERIN 0.4 MG SL SUBL: 0.4000 mg | SUBLINGUAL_TABLET | SUBLINGUAL | Status: DC | PRN

## 2013-07-10 MED ORDER — INFLUENZA VAC SPLIT QUAD 0.5 ML IM SUSP
0.5000 mL | INTRAMUSCULAR | Status: AC
  Administered 2013-07-11: 0.5 mL via INTRAMUSCULAR
  Filled 2013-07-10: qty 0.5

## 2013-07-10 MED ORDER — SODIUM CHLORIDE 0.9 % IJ SOLN: 3.0000 mL | Freq: Two times a day (BID) | INTRAMUSCULAR | Status: DC

## 2013-07-10 NOTE — H&P (Signed)
Date: 07/10/2013               Patient Name:  [name] MRN: [id number]  DOB: Oct 30, 1960 Age / Sex: 53 y.o., female   PCP: Bartholomew Crews, MD         Medical Service: Internal Medicine Teaching Service         Attending Physician: Dr. Elmer Sow, MD    First Contact: Dr. Lesly Dukes Pager: [phone]  Second Contact: Dr. Karlyn Agee Pager: [phone]       After Hours (After 5p/  First Contact Pager: [phone]  weekends / holidays): Second Contact Pager: [phone]   Chief Complaint: Chest pain  History of Present Illness:  [name] is a 53 y.o. female [facility] patient with PMH hypertension, anemia, cocaine abuse, GERD, and hyperlipidemia who presents with chest pain.  Patient reports she was at work this morning when she suddenly developed acute onset, sharp mid-sternal chest pain. It briefly radiated to her left arm with associated tingling, but this resolved on its own. She was vacuuming when the symptoms started, but claims she was not exerting herself more than usual. Chest pain is associated with nausea as well as shortness of breath, non-pleuritic. She also had a transient nosebleed. She was given sublingual nitroglycerin by EMS, with no relief. She rates her pain currently as 8/10, but says this is an improvement from admission. Denies dizziness, lightheadedness, headache. Denies fevers, chills. Denies rhinorrhea, cough. Denies abdominal pain, vomiting, diarrhea. Denies dysuria.  Patient reports she had fairly similar symptoms about 10 years ago, for which she was hospitalized, but no etiology was identified. I did find an old record stating that the patient was admitted to [facility] from 04/18/2007 to 04/20/2007 for chest pain rule out. Presentation was similar, except radiation of pain to neck and arm was more predominant. Troponins were trended and negative x3. 2D echo 04/18/2007 showed ejection fraction 65% with no regional wall motion abnormalities and  normal diastolic parameters. MRI of the cervical spine was performed to rule out cervical disc disease given neck radiation. Results showed mild disc disease with no neural foraminal stenosis. Her UDS came back positive for cocaine, so there was concern for transient coronary vasospasm.   She has no prior history of PE, DVT, MI. She does not appear to ever have had a stress test or cardiac catheterization. Her father had "an enlarged heart", no other family history of heart disease. She smokes one cigarette per day, uses alcohol socially, and denies illicit street drugs including cocaine.  In the ED she was given morphine 4 mg IV x2, Zofran 4 mg IV x2.   Meds: Current Outpatient Prescriptions  Medication Sig Dispense Refill  . ferrous sulfate (IRON SUPPLEMENT) 325 (65 FE) MG tablet Take 325 mg by mouth 3 (three) times daily.       Marland Kitchen lisinopril (PRINIVIL,ZESTRIL) 20 MG tablet Take 1 tablet (20 mg total) by mouth daily.  30 tablet  4  . omeprazole (PRILOSEC) 20 MG capsule Take 1 capsule (20 mg total) by mouth daily.  30 capsule  4  . pravastatin (PRAVACHOL) 40 MG tablet Take 1 tablet (40 mg total) by mouth daily.  30 tablet  3    Allergies: Allergies as of 07/10/2013 - Review Complete 07/10/2013  Allergen Reaction Noted  . Aspirin    . Hydromorphone hcl     Past Medical History  Diagnosis Date  . Hyperlipidemia   . Hiatal hernia   .  GERD (gastroesophageal reflux disease)   . Hypertension    Past Surgical History  Procedure Laterality Date  . Abdominal hysterectomy  12/09    Secondary to fibroids  . Cesarean section     Family History  Problem Relation Age of Onset  . Cancer Mother 24    pancreatic cancer  . Cancer Father 25    throat cancer  . Cancer Maternal Aunt 60    breast cancer   History   Social History  . Marital Status: Divorced    Spouse Name: N/A    Number of Children: N/A  . Years of Education: N/A   Occupational History  . unemployed     used to work  in a [employer]   Social History Main Topics  . Smoking status: Current Every Day Smoker -- 0.30 packs/day    Types: Cigarettes  . Smokeless tobacco: Not on file  . Alcohol Use: Yes     Comment: occasionally  . Drug Use: No  . Sexual Activity: Not on file   Other Topics Concern  . Not on file   Social History Narrative   10 th grade education.    Review of Systems: Pertinent items are noted in HPI.  Physical Exam: Blood pressure 144/86, pulse 72, temperature 98.4 F (36.9 C), temperature source Oral, resp. rate 14, SpO2 99.00%. Physical Exam  Constitutional: She is oriented to person, place, and time and well-developed, well-nourished, and in no distress.  HENT:  Head: Normocephalic and atraumatic.  Eyes: Conjunctivae and EOM are normal. Pupils are equal, round, and reactive to light.  Neck: Normal range of motion. Neck supple.  Cardiovascular: Normal rate, regular rhythm and normal heart sounds.  Exam reveals no gallop and no friction rub.   No murmur heard. Pulmonary/Chest: Effort normal and breath sounds normal. No respiratory distress. She has no wheezes. She has no rales. She exhibits no tenderness (No tenderness to palpation of sternum).  No reproduction of chest pain with deep inspiration.  Abdominal: Soft. Bowel sounds are normal. She exhibits no distension and no mass. There is no tenderness. There is no rebound and no guarding.  Musculoskeletal: Normal range of motion. She exhibits no edema and no tenderness.  Neurological: She is alert and oriented to person, place, and time. GCS score is 15.  Skin: Skin is warm and dry. She is not diaphoretic.  Psychiatric: Affect and judgment normal.     Lab results: Basic Metabolic Panel:  Recent Labs  07/10/13 0911  NA 141  K 3.7  CL 104  CO2 24  GLUCOSE 91  BUN 10  CREATININE 0.54  CALCIUM 8.8   Liver Function Tests:  Recent Labs  07/10/13 0911  AST 18  ALT 23  ALKPHOS 53  BILITOT 0.2*  PROT 6.8    ALBUMIN 3.6   No results found for this basename: LIPASE, AMYLASE,  in the last 72 hours No results found for this basename: AMMONIA,  in the last 72 hours CBC:  Recent Labs  07/10/13 0911  WBC 4.3  HGB 9.9*  HCT 31.1*  MCV 70.7*  PLT 248   Cardiac Enzymes: No results found for this basename: CKTOTAL, CKMB, CKMBINDEX, TROPONINI,  in the last 72 hours BNP: No results found for this basename: PROBNP,  in the last 72 hours D-Dimer: No results found for this basename: DDIMER,  in the last 72 hours CBG: No results found for this basename: GLUCAP,  in the last 72 hours Hemoglobin A1C: No results  found for this basename: HGBA1C,  in the last 72 hours Fasting Lipid Panel: No results found for this basename: CHOL, HDL, LDLCALC, TRIG, CHOLHDL, LDLDIRECT,  in the last 72 hours Thyroid Function Tests: No results found for this basename: TSH, T4TOTAL, FREET4, T3FREE, THYROIDAB,  in the last 72 hours Anemia Panel: No results found for this basename: VITAMINB12, FOLATE, FERRITIN, TIBC, IRON, RETICCTPCT,  in the last 72 hours Coagulation: No results found for this basename: LABPROT, INR,  in the last 72 hours Urine Drug Screen: Drugs of Abuse     Component Value Date/Time   LABOPIA NONE DETECTED 07/10/2013 1052   COCAINSCRNUR NONE DETECTED 07/10/2013 1052   COCAINSCRNUR POS* 07/30/2009 1818   LABBENZ NONE DETECTED 07/10/2013 1052   LABBENZ NEG 07/30/2009 1818   AMPHETMU NONE DETECTED 07/10/2013 1052   AMPHETMU NEG 07/30/2009 1818   THCU NONE DETECTED 07/10/2013 1052   LABBARB NONE DETECTED 07/10/2013 1052    Alcohol Level: No results found for this basename: ETH,  in the last 72 hours Urinalysis:  Recent Labs  07/10/13 [place] 1.019  PHURINE 5.5  GLUCOSEU NEGATIVE  HGBUR NEGATIVE  BILIRUBINUR NEGATIVE  KETONESUR NEGATIVE  PROTEINUR NEGATIVE  UROBILINOGEN 0.2  NITRITE NEGATIVE  LEUKOCYTESUR NEGATIVE    Imaging results:  Dg Chest 2 View  07/10/2013    CLINICAL DATA:  Chest pain  EXAM: CHEST  2 VIEW  COMPARISON:  DG CHEST 2 VIEW dated 03/29/2013  FINDINGS: There is no focal parenchymal opacity, pleural effusion, or pneumothorax. The heart and mediastinal contours are unremarkable.  There is mild degenerative disc disease throughout the thoracic spine.  IMPRESSION: No active cardiopulmonary disease.   Electronically Signed   By: Kathreen Devoid   On: 07/10/2013 09:46    Other results: EKG: Isoelectric wave in lead 1, suggesting borderline right axis deviation. New from prior EKG dated 01/10/2010. Otherwise, normal sinus rhythm, no concerning ST or T-wave changes noted.  Assessment & Plan by Problem: [name] is a 53 y.o. female [facility] patient with PMH hypertension, anemia, cocaine abuse, GERD, and hyperlipidemia who presents with chest pain.  #Chest pain - Concerning features include radiation to left arm with tingling, sudden onset with exertion, associated shortness of breath. EKG without concerning ST or T wave changes. UDS negative for cocaine and other illicit drugs. Point-of-care troponin negative x2. TIMI score is 1-2, 5-8% risk at 14 days of: all-cause mortality, new or recurrent MI, or severe recurrent ischemia requiring urgent revascularization. Wells score 0, Low risk group: 1.3% chance of PE in an ED population. - Admit to IMTS telemetry - Cycle troponins - Coreg 3.125 mg daily twice a day with meals - Tylenol 650 mg po or pr every 6 hours when necessary for pain, fever - Morphine 2 mg IV every 4 hours as needed for severe pain - SL nitroglycerin 0.4 mg every 5 minutes when necessary chest pain - Heart healthy diet  #Chronic iron deficiency anemia - Near baseline hemoglobin of 10-11. Reports a recent nose bleed but no other gross bleeding. Last iron panel was in 2008, showing iron 32 (low), TIBC 332 (wnl), saturation ratio 10 (low), ferritin 18 (wnl), consistent with iron deficiency anemia. She has never had a colonoscopy. Pelvic  ultrasound in 2009 showed multiple fibroids. - CBC, anemia panel in a.m. - Continue home ferrous sulfate 325 mg 3 times a day - Recommend screening colonoscopy as outpatient  Iron/TIBC/Ferritin    Component Value Date/Time  IRON 32* 04/19/2007 0447   TIBC 332 04/19/2007 0447   FERRITIN 18 04/19/2007 0447    Hemoglobin  Date Value Range Status  07/10/2013 9.9* 12.0 - 15.0 g/dL Final  04/30/2013 10.2* 12.0 - 15.0 g/dL Final  03/29/2013 13.6  12.0 - 15.0 g/dL Final  03/29/2013 11.0* 12.0 - 15.0 g/dL Final  06/05/2012 10.5* 12.0 - 15.0 g/dL Final    #Nausea, resolved - Resolved with Zofran in the ED. - Check lipase  #HTN - Well controlled currently. - Continue home lisinopril 20 mg daily  #GERD - Continue home protonix 40 mg daily.  #HLD - Continue home simvastatin 20 mg daily.  #DVT PPX - Heparin subcutaneous.   Dispo: Disposition is deferred at this time, awaiting improvement of current medical problems. Anticipated discharge in approximately 1-3 day(s).   The patient does have a current PCP Bartholomew Crews, MD) and does need an [facility] follow-up appointment after discharge.  The patient does not have transportation limitations that hinder transportation to clinic appointments.  Signed: Lesly Dukes, MD 07/10/2013, 6:18 PM

## 2013-07-10 NOTE — Plan of Care (Signed)
Problem: Phase I Progression Outcomes Goal: Aspirin unless contraindicated Outcome: Not Applicable Date Met:  44/97/53 Pt. Has allergy

## 2013-07-10 NOTE — ED Provider Notes (Signed)
Medical screening examination/treatment/procedure(s) were conducted as a shared visit with non-physician practitioner(s) and myself.  I personally evaluated the patient during the encounter.  EKG Interpretation    Date/Time:  Thursday July 10 2013 09:04:28 EST Ventricular Rate:  75 PR Interval:  159 QRS Duration: 90 QT Interval:  402 QTC Calculation: 449 R Axis:   94 Text Interpretation:  Sinus rhythm Borderline right axis deviation Confirmed by MASNERI  MD, DAVID ([id number]) on 07/10/2013 9:22:23 AM Also confirmed by [facility]  MD, Ewing ([id number])  on 07/10/2013 2:06:23 PM            Admit for CP.  Plan for cardiac evaluation. Patient agrees with plan  Elmer Sow, MD 07/10/13 [phone]

## 2013-07-10 NOTE — ED Provider Notes (Signed)
CSN: [id number]     Arrival date & time 07/10/13  0854 History   First MD Initiated Contact with Patient 07/10/13 [phone]     Chief Complaint  Patient presents with  . Chest Pain   (Consider location/radiation/quality/duration/timing/severity/associated sxs/prior Treatment) HPI  53 year old female with history of GERD, hypertension, hyperlipidemia, presents for evaluation of chest pain. Patient reports acute onset of sharp mid sternal chest pain radiates to her back which started at 8 AM this morning while she was at work. States pain was intense, she felt nauseous, having shortness of breath, and was doubled over due to the pain. Pain subsequently radiates down to her left arm with tingling sensation to the arm. She alert her manager and EMS arrive to bring her to ER. Patient was given sublingual nitroglycerin which provide no significant relief. The pain is still present and currently rated as an 8/10. Denies pleuritic chest pain. Denies lightheadedness or dizziness. Denies fever, chills, headache, runny nose sneezing coughing, hemoptysis, abdominal pain, dysuria, diarrhea, or rash. States she had similar symptoms 10 years ago which she was hospitalized for about 4 days but no specific cause was identified. Admits to be a smoker and has family history of heart disease. Cannot recall any prior cardiac workup. Denies any recent strenuous activities or exercise. Denies any prior history of PE or DVT, no recent surgery, prolonged bed rest, taking exogenous hormone, having calf pain or leg swelling, or having history of cancer.    Past Medical History  Diagnosis Date  . Hyperlipidemia   . Hiatal hernia   . GERD (gastroesophageal reflux disease)   . Hypertension    Past Surgical History  Procedure Laterality Date  . Abdominal hysterectomy  12/09    Secondary to fibroids  . Cesarean section     Family History  Problem Relation Age of Onset  . Cancer Mother 14    pancreatic cancer  . Cancer  Father 90    throat cancer  . Cancer Maternal Aunt 60    breast cancer   History  Substance Use Topics  . Smoking status: Current Every Day Smoker -- 0.30 packs/day    Types: Cigarettes  . Smokeless tobacco: Not on file  . Alcohol Use: Yes     Comment: occasionally   OB History   Grav Para Term Preterm Abortions TAB SAB Ect Mult Living                 Review of Systems  All other systems reviewed and are negative.    Allergies  Aspirin and Hydromorphone hcl  Home Medications   Current Outpatient Rx  Name  Route  Sig  Dispense  Refill  . ferrous sulfate (IRON SUPPLEMENT) 325 (65 FE) MG tablet   Oral   Take 325 mg by mouth 3 (three) times daily.          Marland Kitchen lisinopril (PRINIVIL,ZESTRIL) 20 MG tablet   Oral   Take 1 tablet (20 mg total) by mouth daily.   30 tablet   4   . omeprazole (PRILOSEC) 20 MG capsule   Oral   Take 1 capsule (20 mg total) by mouth daily.   30 capsule   4   . pravastatin (PRAVACHOL) 40 MG tablet   Oral   Take 1 tablet (40 mg total) by mouth daily.   30 tablet   3    BP 152/86  Pulse 71  Temp(Src) 98.4 F (36.9 C) (Oral)  Resp 19  SpO2 100% Physical  Exam  Nursing note and vitals reviewed. Constitutional: She appears well-developed and well-nourished. No distress.  HENT:  Head: Atraumatic.  Nose: Nose normal.  Nose: no active bleeding  Eyes: Conjunctivae are normal.  Neck: Neck supple. No JVD present.  Pulmonary/Chest: She exhibits tenderness (Tenderness to left upper chest wall with palpation but without emphysema, crepitus, or paradoxical chest movement).  Abdominal: Soft. There is no tenderness.  No abdominal bruit  Musculoskeletal: She exhibits no edema and no tenderness (Normal left shoulder range of motion.).  Neurological: She is alert.  Skin: No rash noted.  Psychiatric: She has a normal mood and affect.    ED Course  Procedures (including critical care time)  10:29 AM Patient here with sudden onset of  substernal chest pain radiates to back. She does have left chest wall pain on palpation but no pain to her mid sternal region. She has no overlying skin changes. She does have some cardiac risk factor however.  No significant risk factor concerning for PE. Work up initiated.    2:53 PM Patient has normal data troponin, chest x-ray without acute abnormality, EKG without acute ischemic changes, UA is negative, some evidence of anemia with hemoglobin of 9.9, not far from baseline, and no evidence of leukocytosis.  Pt still report mild reproducible chest wall pain.  I have consulted Dr. Curly Rim.  Plan to consult with [facility] for further evaluation since pt has cardiac risk factors without prior cardiac stress test.    3:16 PM i have consulted with internal medicine resident who agrees to see and evaluate pt in ER and will admit for further care.  Pt currently stable, pain medication given.     Labs Review Labs Reviewed  CBC - Abnormal; Notable for the following:    Hemoglobin 9.9 (*)    HCT 31.1 (*)    MCV 70.7 (*)    MCH 22.5 (*)    All other components within normal limits  COMPREHENSIVE METABOLIC PANEL - Abnormal; Notable for the following:    Total Bilirubin 0.2 (*)    All other components within normal limits  URINALYSIS, ROUTINE W REFLEX MICROSCOPIC  POCT I-STAT TROPONIN I  POCT I-STAT TROPONIN I   Imaging Review Dg Chest 2 View  07/10/2013   CLINICAL DATA:  Chest pain  EXAM: CHEST  2 VIEW  COMPARISON:  DG CHEST 2 VIEW dated 03/29/2013  FINDINGS: There is no focal parenchymal opacity, pleural effusion, or pneumothorax. The heart and mediastinal contours are unremarkable.  There is mild degenerative disc disease throughout the thoracic spine.  IMPRESSION: No active cardiopulmonary disease.   Electronically Signed   By: Kathreen Devoid   On: 07/10/2013 09:46    EKG Interpretation    Date/Time:  Thursday July 10 2013 09:04:28 EST Ventricular Rate:  75 PR Interval:  159 QRS  Duration: 90 QT Interval:  402 QTC Calculation: 449 R Axis:   94 Text Interpretation:  Sinus rhythm Borderline right axis deviation Confirmed by MASNERI  MD, DAVID ([id number]) on 07/10/2013 9:22:23 AM            MDM   1. Chest pain    BP 144/86  Pulse 72  Temp(Src) 98.4 F (36.9 C) (Oral)  Resp 14  SpO2 99%  I have reviewed nursing notes and vital signs. I personally reviewed the imaging tests through PACS system  I reviewed available ER/hospitalization records thought the EMR     Domenic Moras, [place] 07/10/13 1543

## 2013-07-10 NOTE — ED Notes (Addendum)
Pt reports to the ED for eval of sudden onset midsternal non-radiating pain. Pt was vacuuming when the pain began. Pt had a similar episode and she was admitted for 4 days and they did not find anything. 12 lead unremarkable. Pain is worse with movement. Pt received 2 nitros PTA and reported an initial relief in pain however, pt reports that now her pain has increased and it is now radiating into her back in the mid scapular region. Pt did not receive ASA as she is allergic. Pt reports associated symptoms of SOB and nausea. Pt also reports she had a nose bleed. Pt was hypertensive upon EMS arrival however after nitro administration her BP went down to 140/90. Denies any vomiting, diaphoresis, lightheadedness, or dizziness. Pt A&O x4, resp e/u, and skin warm and dry.

## 2013-07-11 ENCOUNTER — Other Ambulatory Visit (HOSPITAL_COMMUNITY): Payer: Self-pay

## 2013-07-11 ENCOUNTER — Observation Stay (HOSPITAL_COMMUNITY): Payer: No Typology Code available for payment source

## 2013-07-11 DIAGNOSIS — F17200 Nicotine dependence, unspecified, uncomplicated: Secondary | ICD-10-CM

## 2013-07-11 DIAGNOSIS — R079 Chest pain, unspecified: Secondary | ICD-10-CM

## 2013-07-11 DIAGNOSIS — R110 Nausea: Secondary | ICD-10-CM

## 2013-07-11 LAB — IRON AND TIBC
Iron: 62 ug/dL (ref 42–135)
Saturation Ratios: 19 % — ABNORMAL LOW (ref 20–55)
TIBC: 321 ug/dL (ref 250–470)
UIBC: 259 ug/dL (ref 125–400)

## 2013-07-11 LAB — FOLATE: Folate: >20 ng/mL

## 2013-07-11 LAB — CBC
HCT: 29 % — ABNORMAL LOW (ref 36.0–46.0)
HEMOGLOBIN: 9.1 g/dL — AB (ref 12.0–15.0)
MCH: 22.2 pg — AB (ref 26.0–34.0)
MCHC: 31.4 g/dL (ref 30.0–36.0)
MCV: 70.9 fL — ABNORMAL LOW (ref 78.0–100.0)
PLATELETS: 247 10*3/uL (ref 150–400)
RBC: 4.09 MIL/uL (ref 3.87–5.11)
RDW: 13.3 % (ref 11.5–15.5)
WBC: 4.8 10*3/uL (ref 4.0–10.5)

## 2013-07-11 LAB — FERRITIN: Ferritin: 121 ng/mL (ref 10–291)

## 2013-07-11 LAB — TROPONIN I
Troponin I: <0.3 ng/mL (ref <0.30)
Troponin I: <0.3 ng/mL (ref <0.30)

## 2013-07-11 LAB — VITAMIN B12: VITAMIN B 12: 608 pg/mL (ref 211–911)

## 2013-07-11 MED ORDER — REGADENOSON 0.4 MG/5ML IV SOLN
0.4000 mg | Freq: Once | INTRAVENOUS | Status: AC
  Administered 2013-07-11: 0.4 mg via INTRAVENOUS
  Filled 2013-07-11: qty 5

## 2013-07-11 MED ORDER — CLOPIDOGREL BISULFATE 75 MG PO TABS: 75.0000 mg | ORAL_TABLET | Freq: Every day | ORAL | Status: DC

## 2013-07-11 MED ORDER — REGADENOSON 0.4 MG/5ML IV SOLN
INTRAVENOUS | Status: AC
  Administered 2013-07-11: 0.4 mg via INTRAVENOUS
  Filled 2013-07-11: qty 5

## 2013-07-11 MED ORDER — ACETAMINOPHEN 325 MG PO TABS: 650.0000 mg | ORAL_TABLET | Freq: Four times a day (QID) | ORAL | Status: DC | PRN

## 2013-07-11 MED ORDER — ONDANSETRON HCL 4 MG PO TABS: 4.0000 mg | ORAL_TABLET | Freq: Three times a day (TID) | ORAL | Status: DC | PRN

## 2013-07-11 MED ORDER — TECHNETIUM TC 99M SESTAMIBI - CARDIOLITE
30.0000 | Freq: Once | INTRAVENOUS | Status: AC | PRN
  Administered 2013-07-11: 30 via INTRAVENOUS

## 2013-07-11 MED ORDER — ONDANSETRON HCL 4 MG/2ML IJ SOLN
4.0000 mg | Freq: Four times a day (QID) | INTRAMUSCULAR | Status: DC | PRN
  Administered 2013-07-11: 4 mg via INTRAVENOUS
  Filled 2013-07-11: qty 2

## 2013-07-11 MED ORDER — TECHNETIUM TC 99M SESTAMIBI - CARDIOLITE
10.0000 | Freq: Once | INTRAVENOUS | Status: AC | PRN
  Administered 2013-07-11: 11:00:00 10 via INTRAVENOUS

## 2013-07-11 NOTE — Discharge Instructions (Signed)
It was a pleasure taking care of you.  - We have ruled out a heart attack. Your stress test was normal. Please follow up in the clinic, appointment has been scheduled.  - I have prescribed you Tylenol for pain control. - I have prescribed you a medicine for your nausea, Zofran. Please take it as needed. - I have provided you with a work note. - If you develop severe chest pain, severe shortness of breath, sudden numbness, sudden weakness, please call the clinic at [phone] or return to the ED.    Chest Pain Observation It is often hard to give a specific diagnosis for the cause of chest pain. Among other possibilities your symptoms might be caused by inadequate oxygen delivery to your heart (angina). Angina that is not treated or evaluated can lead to a heart attack (myocardial infarction) or death. Blood tests, electrocardiograms, and X-rays may have been done to help determine a possible cause of your chest pain. After evaluation and observation, your health care provider has determined that it is unlikely your pain was caused by an unstable condition that requires hospitalization. However, a full evaluation of your pain may need to be completed, with additional diagnostic testing as directed. It is very important to keep your follow-up appointments. Not keeping your follow-up appointments could result in permanent heart damage, disability, or death. If there is any problem keeping your follow-up appointments, you must call your health care provider. HOME CARE INSTRUCTIONS  Due to the slight chance that your pain could be angina, it is important to follow your health care provider's treatment plan and also maintain a healthy lifestyle:  Maintain or work toward achieving a healthy weight.  Stay physically active and exercise regularly.  Decrease your salt intake.  Eat a balanced, healthy diet. Talk to a dietician to learn about heart healthy foods.  Increase your fiber intake by including  whole grains, vegetables, fruits, and nuts in your diet.  Avoid situations that cause stress, anger, or depression.  Take medicines as advised by your health care provider. Report any side effects to your health care provider. Do not stop medicines or adjust the dosages on your own.  Quit smoking. Do not use nicotine patches or gum until you check with your health care provider.  Keep your blood pressure, blood sugar, and cholesterol levels within normal limits.  Limit alcohol intake to no more than 1 drink per day for women that are not pregnant and 2 drinks per day for men.  Do not abuse drugs. SEEK IMMEDIATE MEDICAL CARE IF: You have severe chest pain or pressure which may include symptoms such as:  You feel pain or pressure in you arms, neck, jaw, or back.  You have severe back or abdominal pain, feel sick to your stomach (nauseous), or throw up (vomit).  You are sweating profusely.  You are having a fast or irregular heartbeat.  You feel short of breath while at rest.  You notice increasing shortness of breath during rest, sleep, or with activity.  You have chest pain that does not get better after rest or after taking your usual medicine.  You wake from sleep with chest pain.  You are unable to sleep because you cannot breathe.  You develop a frequent cough or you are coughing up blood.  You feel dizzy, faint, or experience extreme fatigue.  You develop severe weakness, dizziness, fainting, or chills. Any of these symptoms may represent a serious problem that is an emergency. Do not  wait to see if the symptoms will go away. Call your local emergency services (911 in the [place] Do not drive yourself to the hospital. MAKE SURE YOU:  Understand these instructions.  Will watch your condition.  Will get help right away if you are not doing well or get worse. Document Released: 07/15/2010 Document Revised: 02/12/2013 Document Reviewed: 12/12/2012 [facility] Patient  Information 2014 [place], [place].  Smoking Cessation Quitting smoking is important to your health and has many advantages. However, it is not always easy to quit since nicotine is a very addictive drug. Often times, people try 3 times or more before being able to quit. This document explains the best ways for you to prepare to quit smoking. Quitting takes hard work and a lot of effort, but you can do it. ADVANTAGES OF QUITTING SMOKING  You will live longer, feel better, and live better.  Your body will feel the impact of quitting smoking almost immediately.  Within 20 minutes, blood pressure decreases. Your pulse returns to its normal level.  After 8 hours, carbon monoxide levels in the blood return to normal. Your oxygen level increases.  After 24 hours, the chance of having a heart attack starts to decrease. Your breath, hair, and body stop smelling like smoke.  After 48 hours, damaged nerve endings begin to recover. Your sense of taste and smell improve.  After 72 hours, the body is virtually free of nicotine. Your bronchial tubes relax and breathing becomes easier.  After 2 to 12 weeks, lungs can hold more air. Exercise becomes easier and circulation improves.  The risk of having a heart attack, stroke, cancer, or lung disease is greatly reduced.  After 1 year, the risk of coronary heart disease is cut in half.  After 5 years, the risk of stroke falls to the same as a nonsmoker.  After 10 years, the risk of lung cancer is cut in half and the risk of other cancers decreases significantly.  After 15 years, the risk of coronary heart disease drops, usually to the level of a nonsmoker.  If you are pregnant, quitting smoking will improve your chances of having a healthy baby.  The people you live with, especially any children, will be healthier.  You will have extra money to spend on things other than cigarettes. QUESTIONS TO THINK ABOUT BEFORE ATTEMPTING TO QUIT You may want to  talk about your answers with your caregiver.  Why do you want to quit?  If you tried to quit in the past, what helped and what did not?  What will be the most difficult situations for you after you quit? How will you plan to handle them?  Who can help you through the tough times? Your family? Friends? A caregiver?  What pleasures do you get from smoking? What ways can you still get pleasure if you quit? Here are some questions to ask your caregiver:  How can you help me to be successful at quitting?  What medicine do you think would be best for me and how should I take it?  What should I do if I need more help?  What is smoking withdrawal like? How can I get information on withdrawal? GET READY  Set a quit date.  Change your environment by getting rid of all cigarettes, ashtrays, matches, and lighters in your home, car, or work. Do not let people smoke in your home.  Review your past attempts to quit. Think about what worked and what did not. GET SUPPORT  AND ENCOURAGEMENT You have a better chance of being successful if you have help. You can get support in many ways.  Tell your family, friends, and co-workers that you are going to quit and need their support. Ask them not to smoke around you.  Get individual, group, or telephone counseling and support. Programs are available at [employer] and health centers. Call your local health department for information about programs in your area.  Spiritual beliefs and practices may help some smokers quit.  Download a "quit meter" on your computer to keep track of quit statistics, such as how long you have gone without smoking, cigarettes not smoked, and money saved.  Get a self-help book about quitting smoking and staying off of tobacco. [place] yourself from urges to smoke. Talk to someone, go for a walk, or occupy your time with a task.  Change your normal routine. Take a different route to  work. Drink tea instead of coffee. Eat breakfast in a different place.  Reduce your stress. Take a hot bath, exercise, or read a book.  Plan something enjoyable to do every day. Reward yourself for not smoking.  Explore interactive web-based programs that specialize in helping you quit. GET MEDICINE AND USE IT CORRECTLY Medicines can help you stop smoking and decrease the urge to smoke. Combining medicine with the above behavioral methods and support can greatly increase your chances of successfully quitting smoking.  Nicotine replacement therapy helps deliver nicotine to your body without the negative effects and risks of smoking. Nicotine replacement therapy includes nicotine gum, lozenges, inhalers, nasal sprays, and skin patches. Some may be available over-the-counter and others require a prescription.  Antidepressant medicine helps people abstain from smoking, but how this works is unknown. This medicine is available by prescription.  Nicotinic receptor partial agonist medicine simulates the effect of nicotine in your brain. This medicine is available by prescription. Ask your caregiver for advice about which medicines to use and how to use them based on your health history. Your caregiver will tell you what side effects to look out for if you choose to be on a medicine or therapy. Carefully read the information on the package. Do not use any other product containing nicotine while using a nicotine replacement product.  RELAPSE OR DIFFICULT SITUATIONS Most relapses occur within the first 3 months after quitting. Do not be discouraged if you start smoking again. Remember, most people try several times before finally quitting. You may have symptoms of withdrawal because your body is used to nicotine. You may crave cigarettes, be irritable, feel very hungry, cough often, get headaches, or have difficulty concentrating. The withdrawal symptoms are only temporary. They are strongest when you first  quit, but they will go away within 10 14 days. To reduce the chances of relapse, try to:  Avoid drinking alcohol. Drinking lowers your chances of successfully quitting.  Reduce the amount of caffeine you consume. Once you quit smoking, the amount of caffeine in your body increases and can give you symptoms, such as a rapid heartbeat, sweating, and anxiety.  Avoid smokers because they can make you want to smoke.  Do not let weight gain distract you. Many smokers will gain weight when they quit, usually less than 10 pounds. Eat a healthy diet and stay active. You can always lose the weight gained after you quit.  Find ways to improve your mood other than smoking. FOR MORE INFORMATION  www.smokefree.gov  Document Released: 06/06/2001 Document  Revised: 12/12/2011 Document Reviewed: 09/21/2011 [facility] Patient Information 2014 [place], [place].  You Can Quit Smoking If you are ready to quit smoking or are thinking about it, congratulations! You have chosen to help yourself be healthier and live longer! There are lots of different ways to quit smoking. Nicotine gum, nicotine patches, a nicotine inhaler, or nicotine nasal spray can help with physical craving. Hypnosis, support groups, and medicines help break the habit of smoking. TIPS TO GET OFF AND STAY OFF CIGARETTES  Learn to predict your moods. Do not let a bad situation be your excuse to have a cigarette. Some situations in your life might tempt you to have a cigarette.  Ask friends and co-workers not to smoke around you.  Make your home smoke-free.  Never have "just one" cigarette. It leads to wanting another and another. Remind yourself of your decision to quit.  On a card, make a list of your reasons for not smoking. Read it at least the same number of times a day as you have a cigarette. Tell yourself everyday, "I do not want to smoke. I choose not to smoke."  Ask someone at home or work to help you with your plan to quit  smoking.  Have something planned after you eat or have a cup of coffee. Take a walk or get other exercise to perk you up. This will help to keep you from overeating.  Try a relaxation exercise to calm you down and decrease your stress. Remember, you may be tense and nervous the first two weeks after you quit. This will pass.  Find new activities to keep your hands busy. Play with a pen, coin, or rubber band. Doodle or draw things on paper.  Brush your teeth right after eating. This will help cut down the craving for the taste of tobacco after meals. You can try mouthwash too.  Try gum, breath mints, or diet candy to keep something in your mouth. IF YOU SMOKE AND WANT TO QUIT:  Do not stock up on cigarettes. Never buy a carton. Wait until one pack is finished before you buy another.  Never carry cigarettes with you at work or at home.  Keep cigarettes as far away from you as possible. Leave them with someone else.  Never carry matches or a lighter with you.  Ask yourself, "Do I need this cigarette or is this just a reflex?"  Bet with someone that you can quit. Put cigarette money in a piggy bank every morning. If you smoke, you give up the money. If you do not smoke, by the end of the week, you keep the money.  Keep trying. It takes 21 days to change a habit!  Talk to your doctor about using medicines to help you quit. These include nicotine replacement gum, lozenges, or skin patches. Document Released: 04/08/2009 Document Revised: 09/04/2011 Document Reviewed: 04/08/2009 [facility] Patient Information 2014 [place].

## 2013-07-11 NOTE — Progress Notes (Addendum)
Subjective: Patient seen and examined at the bedside this morning. She is complaining of some nausea. She had some retching overnight but no vomiting. Her chest pain is still present, but less severe than yesterday. She is amenable to staying for a stress test and appreciative that we are pursuing this, as she has no insurance to get it as an outpatient.  She denies dark, tarry, or bloody stools. She has never had a colonoscopy, but apparently is working on having this arranged through the [facility].  Objective: Vital signs in last 24 hours: Filed Vitals:   07/10/13 1845 07/10/13 1930 07/10/13 2035 07/11/13 0542  BP: 134/80 139/72 135/77 109/59  Pulse: 62 61 73 62  Temp:   98 F (36.7 C) 98.2 F (36.8 C)  TempSrc:   Oral Oral  Resp: 15 17 18 18   Height:   4\' 11"  (1.499 m)   Weight:   143 lb 9.6 oz (65.137 kg)   SpO2: 100% 99% 100% 98%   Weight change:  No intake or output data in the 24 hours ending 07/11/13 1145  Physical Exam  Constitutional: She is oriented to person, place, and time and well-developed, well-nourished, and in no distress.  HENT:  Head: Normocephalic and atraumatic.  Eyes: Conjunctivae and EOM are normal. Pupils are equal, round, and reactive to light.  Neck: Normal range of motion. Neck supple.  Cardiovascular: Normal rate, regular rhythm and normal heart sounds. Exam reveals no gallop and no friction rub.  No murmur heard.  Pulmonary/Chest: Effort normal and breath sounds normal. No respiratory distress. She has no wheezes. She has no rales. She exhibits no tenderness (No tenderness to palpation of sternum).  No reproduction of chest pain with deep inspiration.  Abdominal: Soft. Bowel sounds are normal. She exhibits no distension and no mass. There is no tenderness. There is no rebound and no guarding.  Musculoskeletal: Normal range of motion. She exhibits no edema and no tenderness.  Neurological: She is alert and oriented to person, place, and time. GCS score  is 15.  Skin: Skin is warm and dry. She is not diaphoretic.  Psychiatric: Affect and judgment normal.   Lab Results: Basic Metabolic Panel:  Recent Labs Lab 07/10/13 0911  NA 141  K 3.7  CL 104  CO2 24  GLUCOSE 91  BUN 10  CREATININE 0.54  CALCIUM 8.8   Liver Function Tests:  Recent Labs Lab 07/10/13 0911  AST 18  ALT 23  ALKPHOS 53  BILITOT 0.2*  PROT 6.8  ALBUMIN 3.6    Recent Labs Lab 07/10/13 2203  LIPASE 19   No results found for this basename: AMMONIA,  in the last 168 hours CBC:  Recent Labs Lab 07/10/13 0911 07/11/13 0424  WBC 4.3 4.8  HGB 9.9* 9.1*  HCT 31.1* 29.0*  MCV 70.7* 70.9*  PLT 248 247   Cardiac Enzymes:  Recent Labs Lab 07/10/13 2203 07/11/13 0424 07/11/13 0835  TROPONINI <0.30 <0.30 <0.30   BNP: No results found for this basename: PROBNP,  in the last 168 hours D-Dimer: No results found for this basename: DDIMER,  in the last 168 hours CBG: No results found for this basename: GLUCAP,  in the last 168 hours Hemoglobin A1C: No results found for this basename: HGBA1C,  in the last 168 hours Fasting Lipid Panel: No results found for this basename: CHOL, HDL, LDLCALC, TRIG, CHOLHDL, LDLDIRECT,  in the last 168 hours Thyroid Function Tests: No results found for this basename: TSH, T4TOTAL,  FREET4, T3FREE, THYROIDAB,  in the last 168 hours Coagulation: No results found for this basename: LABPROT, INR,  in the last 168 hours Anemia Panel:  Recent Labs Lab 07/10/13 1851  VITAMINB12 608  FOLATE >20.0  FERRITIN 121  TIBC 321  IRON 62  RETICCTPCT 0.8   Urine Drug Screen: Drugs of Abuse     Component Value Date/Time   LABOPIA NONE DETECTED 07/10/2013 1052   COCAINSCRNUR NONE DETECTED 07/10/2013 1052   COCAINSCRNUR POS* 07/30/2009 1818   LABBENZ NONE DETECTED 07/10/2013 1052   LABBENZ NEG 07/30/2009 1818   AMPHETMU NONE DETECTED 07/10/2013 1052   AMPHETMU NEG 07/30/2009 1818   THCU NONE DETECTED 07/10/2013 1052   LABBARB NONE  DETECTED 07/10/2013 1052    Alcohol Level: No results found for this basename: ETH,  in the last 168 hours Urinalysis:  Recent Labs Lab 07/10/13 [place]  LABSPEC 1.019  PHURINE 5.5  GLUCOSEU NEGATIVE  HGBUR NEGATIVE  BILIRUBINUR NEGATIVE  KETONESUR NEGATIVE  PROTEINUR NEGATIVE  UROBILINOGEN 0.2  NITRITE NEGATIVE  LEUKOCYTESUR NEGATIVE    Micro Results: No results found for this or any previous visit (from the past 240 hour(s)). Studies/Results: Dg Chest 2 View  07/10/2013   CLINICAL DATA:  Chest pain  EXAM: CHEST  2 VIEW  COMPARISON:  DG CHEST 2 VIEW dated 03/29/2013  FINDINGS: There is no focal parenchymal opacity, pleural effusion, or pneumothorax. The heart and mediastinal contours are unremarkable.  There is mild degenerative disc disease throughout the thoracic spine.  IMPRESSION: No active cardiopulmonary disease.   Electronically Signed   By: Kathreen Devoid   On: 07/10/2013 09:46   Medications: I have reviewed the patient's current medications. Scheduled Meds: . carvedilol  3.125 mg Oral BID WC  . ferrous sulfate  325 mg Oral TID  . heparin  5,000 Units Subcutaneous Q8H  . lisinopril  20 mg Oral Daily  . LORazepam  1 mg Intravenous Once  . pantoprazole  40 mg Oral Daily  . simvastatin  20 mg Oral q1800  . sodium chloride  3 mL Intravenous Q12H  . sodium chloride  3 mL Intravenous Q12H   Continuous Infusions:  PRN Meds:.sodium chloride, acetaminophen, acetaminophen, morphine injection, nitroGLYCERIN, ondansetron (ZOFRAN) IV, sodium chloride  Assessment/Plan: [name] is a 53 y.o. female [facility] patient with PMH hypertension, anemia, cocaine abuse, GERD, and hyperlipidemia who presents with chest pain.   #Chest pain - Troponins negative x3. Repeat EKG unchanged. Pain is atypical in nature. She is low probability for ACS, but we will pursue an inpatient stress test as she does not have insurance with which to get it an outpatient. Patient is allergic  to aspirin.  - Follow up stress test - Adding Plavix 75mg  daily - Discontinue Coreg 3.125 mg daily twice a day with meals, I do not think she is having active ACS - Tylenol 650 mg po or pr every 6 hours when necessary for pain, fever  - Morphine 2 mg IV every 4 hours as needed for severe pain  - SL nitroglycerin 0.4 mg every 5 minutes when necessary chest pain  - Heart healthy diet   #Chronic iron deficiency anemia - Hemoglobin 9.1 this morning, baseline hemoglobin of 10-11. Reports a recent nose bleed but no other gross bleeding. Repeat iron panel normal, as below. She has never had a colonoscopy. She is s/p hysterectomy in 2009.  - Continue home ferrous sulfate 325 mg 3 times a day  - Recommend screening  colonoscopy as outpatient - Repeat CBC as outpatient  Iron/TIBC/Ferritin    Component Value Date/Time   IRON 62 07/10/2013 1851   TIBC 321 07/10/2013 1851   FERRITIN 121 07/10/2013 1851   Hemoglobin  Date Value Range Status  07/11/2013 9.1* 12.0 - 15.0 g/dL Final  07/10/2013 9.9* 12.0 - 15.0 g/dL Final  04/30/2013 10.2* 12.0 - 15.0 g/dL Final  03/29/2013 13.6  12.0 - 15.0 g/dL Final  03/29/2013 11.0* 12.0 - 15.0 g/dL Final    #Nausea - Lipase 19 (wnl). - Continue Zofran 4mg  IV or po q6h prn  #HTN - Well controlled currently.  - Continue home lisinopril 20 mg daily   #GERD - Continue home protonix 40 mg daily.   #HLD - Continue home simvastatin 20 mg daily.   #Health maintenance - Flu vaccine.  #DVT PPX - Heparin subcutaneous.  Dispo: Disposition is deferred at this time, awaiting improvement of current medical problems.  Anticipated discharge in approximately 0-2 day(s).   The patient does have a current PCP Bartholomew Crews, MD) and does need an [facility] hospital follow-up appointment after discharge.  The patient does not have transportation limitations that hinder transportation to clinic appointments.  .Services Needed at time of discharge: Y = Yes, Blank = No PT:     OT:   RN:   Equipment:   Other:     LOS: 1 day   Lesly Dukes, MD 07/11/2013, 11:45 AM

## 2013-07-11 NOTE — H&P (Signed)
INTERNAL MEDICINE TEACHING SERVICE Attending Admission Note  Date: 07/11/2013  Patient name: [name]  Medical record number: [id number]  Date of birth: 1961/02/12    I have seen and evaluated [name] and discussed their care with the Residency Team.  53 yr old female with pmhx significant for microcytic anemia, cocaine abuse, HTN, GERD, HL, presented with CP. She describes the pain as acute and sharp while she was vacuuming. This was associated with nausea. NTG did not improve her pain. She denied previous hx of CP on exertion. Denied any recent cocaine use. She states she was not overexerting herself and this has not happened before. She denies SOB. She has ruled out for ACS with 5 negative Trop I values. EKG without acute ST changes, NSR. She is CP free this morning. On exam,  CV: S1S2, no m/r/g, RRR PULM: CTA bilat  Chest pain appears mostly atypical in nature. TIMI score is ~2. Agree with inpatient stress test today. If negative, may D/C home with PCP follow up. She has an allergy to ASA therefore plavix is reasonable to use in her case.  Dominic Pea, DO, [facility] 07/11/2013, 12:27 PM

## 2013-07-11 NOTE — Discharge Summary (Signed)
Name: [name] MRN: [id number] DOB: May 11, 1961 53 y.o. PCP: Bartholomew Crews, MD  Date of Admission: 07/10/2013  8:54 AM Date of Discharge: 07/11/2013 Attending Physician: Dominic Pea, DO  Discharge Diagnosis: 1. Chest pain 2. Chronic iron deficiency anemia 3. Nausea 4. HTN 5. GERD 6. HLD  Discharge Medications:   Medication List         acetaminophen 325 MG tablet  Commonly known as:  TYLENOL  Take 2 tablets (650 mg total) by mouth every 6 (six) hours as needed for mild pain (or Fever >/= 101).     IRON SUPPLEMENT 325 (65 FE) MG tablet  Generic drug:  ferrous sulfate  Take 325 mg by mouth 3 (three) times daily.     lisinopril 20 MG tablet  Commonly known as:  PRINIVIL,ZESTRIL  Take 1 tablet (20 mg total) by mouth daily.     omeprazole 20 MG capsule  Commonly known as:  PRILOSEC  Take 1 capsule (20 mg total) by mouth daily.     ondansetron 4 MG tablet  Commonly known as:  ZOFRAN  Take 1 tablet (4 mg total) by mouth every 8 (eight) hours as needed for nausea or vomiting.     pravastatin 40 MG tablet  Commonly known as:  PRAVACHOL  Take 1 tablet (40 mg total) by mouth daily.        Disposition and follow-up:   [name] was discharged from [facility] in Stable condition.  At the hospital follow up visit please address:  1.  Recurrent chest pain? Consider starting Plavix for primary prevention, but cost may be an issue in this uninsured patient.  2.  Labs / imaging needed at time of follow-up: CBC, screening colonoscopy  3.  Pending labs/ test needing follow-up: None  Follow-up Appointments: Follow-up Information   Follow up with Larey Dresser, MD On 07/22/2013. (8:45 AM)    Specialty:  Internal Medicine   Contact information:   [place] [address] [phone]       Discharge Instructions: Discharge Orders   Future Appointments Provider Department Dept Phone   07/22/2013 8:45 AM  Bartholomew Crews, MD [place] [phone]   Future Orders Complete By Expires   Diet - low sodium heart healthy  As directed    Increase activity slowly  As directed       Consultations:  None  Procedures Performed:   Dg Chest 2 View  07/10/2013   CLINICAL DATA:  Chest pain  EXAM: CHEST  2 VIEW  COMPARISON:  DG CHEST 2 VIEW dated 03/29/2013  FINDINGS: There is no focal parenchymal opacity, pleural effusion, or pneumothorax. The heart and mediastinal contours are unremarkable.  There is mild degenerative disc disease throughout the thoracic spine.  IMPRESSION: No active cardiopulmonary disease.   Electronically Signed   By: Kathreen Devoid   On: 07/10/2013 09:46   NM Myocar Multi W/Spect W/Wall Motion / EF  CLINICAL DATA: 53 year old with chest pain.  EXAM:  MYOCARDIAL IMAGING WITH SPECT (REST AND PHARMACOLOGIC-STRESS)  GATED LEFT VENTRICULAR WALL MOTION STUDY  LEFT VENTRICULAR EJECTION FRACTION  TECHNIQUE:  Standard myocardial SPECT imaging performed after resting  intravenous injection of 10 mCi Tc-44m sestimibi. Subsequently,  intravenous infusion of regadenoson performed under the supervision  of the Cardiology staff. At peak effect of the drug, 30 mCi Tc-36m  sestimibi injected intravenously and standard myocardial SPECT  imaging performed. Quantitative gated imaging also performed to  evaluate left ventricular wall motion and estimate left ventricular  ejection fraction.  FINDINGS:  MYOCARDIAL IMAGING WITH SPECT (REST AND PHARMACOLOGIC-STRESS)  The myocardial perfusion is normal on the stress images. There is no  evidence for a reversible defect. No significant fixed defect.  GATED LEFT VENTRICULAR WALL MOTION STUDY  Review of the gated images demonstrates normal wall motion.  LEFT VENTRICULAR EJECTION FRACTION  QGS ejection fraction measures 64% , with an end-diastolic volume of  79 ml and an end-systolic volume of 28 ml.  IMPRESSION:  No  evidence for pharmacological induced ischemia.  Calculated ejection fraction is 64% with normal wall motion.   Admission HPI:  [name] is a 53 y.o. female [facility] patient with PMH hypertension, anemia, cocaine abuse, GERD, and hyperlipidemia who presents with chest pain.   Patient reports she was at work this morning when she suddenly developed acute onset, sharp mid-sternal chest pain. It briefly radiated to her left arm with associated tingling, but this resolved on its own. She was vacuuming when the symptoms started, but claims she was not exerting herself more than usual. Chest pain is associated with nausea as well as shortness of breath, non-pleuritic. She also had a transient nosebleed. She was given sublingual nitroglycerin by EMS, with no relief. She rates her pain currently as 8/10, but says this is an improvement from admission. Denies dizziness, lightheadedness, headache. Denies fevers, chills. Denies rhinorrhea, cough. Denies abdominal pain, vomiting, diarrhea. Denies dysuria.   Patient reports she had fairly similar symptoms about 10 years ago, for which she was hospitalized, but no etiology was identified. I did find an old record stating that the patient was admitted to [facility] from 04/18/2007 to 04/20/2007 for chest pain rule out. Presentation was similar, except radiation of pain to neck and arm was more predominant. Troponins were trended and negative x3. 2D echo 04/18/2007 showed ejection fraction 65% with no regional wall motion abnormalities and normal diastolic parameters. MRI of the cervical spine was performed to rule out cervical disc disease given neck radiation. Results showed mild disc disease with no neural foraminal stenosis. Her UDS came back positive for cocaine, so there was concern for transient coronary vasospasm.   She has no prior history of PE, DVT, MI. She does not appear to ever have had a stress test or cardiac catheterization. Her father had  "an enlarged heart", no other family history of heart disease. She smokes one cigarette per day, uses alcohol socially, and denies illicit street drugs including cocaine.   In the ED she was given morphine 4 mg IV x2, Zofran 4 mg IV x2.  Physical Exam:  Blood pressure 144/86, pulse 72, temperature 98.4 F (36.9 C), temperature source Oral, resp. rate 14, SpO2 99.00%.  Physical Exam  Constitutional: She is oriented to person, place, and time and well-developed, well-nourished, and in no distress.  HENT:  Head: Normocephalic and atraumatic.  Eyes: Conjunctivae and EOM are normal. Pupils are equal, round, and reactive to light.  Neck: Normal range of motion. Neck supple.  Cardiovascular: Normal rate, regular rhythm and normal heart sounds. Exam reveals no gallop and no friction rub.  No murmur heard.  Pulmonary/Chest: Effort normal and breath sounds normal. No respiratory distress. She has no wheezes. She has no rales. She exhibits no tenderness (No tenderness to palpation of sternum).  No reproduction of chest pain with deep inspiration.  Abdominal: Soft. Bowel sounds are normal. She exhibits no distension and no mass. There is no  tenderness. There is no rebound and no guarding.  Musculoskeletal: Normal range of motion. She exhibits no edema and no tenderness.  Neurological: She is alert and oriented to person, place, and time. GCS score is 15.  Skin: Skin is warm and dry. She is not diaphoretic.  Psychiatric: Affect and judgment normal.    Hospital Course by problem list: [name] is a 53 y.o. female [facility] patient with PMH hypertension, anemia, cocaine abuse, GERD, and hyperlipidemia who presents with chest pain.  1. Chest pain - On admission patient reported acute-onset, sharp chest pain. Concerning features include radiation to left arm with tingling, sudden onset with exertion, and associated shortness of breath and nausea. TIMI score is 1-2, 5-8% risk at 14 days of: all-cause  mortality, new or recurrent MI, or severe recurrent ischemia requiring urgent revascularization. Wells score 0, Low risk group: 1.3% chance of PE in an ED population. EKG did not show ST or T wave changes. Pain was not relieved with sublingual nitroglycerin. UDS was negative for cocaine and other illicit drugs. Serial troponins were negative and repeat EKG was unchanged. We initially started Coreg, but discontinued this on hospital day 2 as her clinical picture did not suggest ACS. Patient is allergic to aspirin (hives). She was low probability for ACS, but we pursued an inpatient stress test as she does not have insurance with which to get it as an outpatient. This showed no evidence for pharmacological induced ischemia, calculated ejection fraction 64% with normal wall motion. She was discharged in stable condition with a prescription for Tylenol for pain control.  2. Chronic iron deficiency anemia - Hemoglobin on admission was 9.9, down to 9.1 on day 2. Patient's baseline hemoglobin is 10-11. She reported a recent nose bleed, but no other gross bleeding. Denied bloody, black, or tarry stools. Last iron panel was in 2008, showing iron 32 (low), TIBC 332 (wnl), saturation ratio 10 (low), ferritin 18 (wnl), consistent with iron deficiency anemia. Repeat iron panel within normal limits as below. She has never had a colonoscopy. She is s/p hysterectomy in 2009 2/2 uterine fibroids. We continued home ferrous sulfate 325 mg 3 times a day. Recommend screening colonoscopy as outpatient.   Iron/TIBC/Ferritin    Component Value Date/Time   IRON 62 07/10/2013 1851   TIBC 321 07/10/2013 1851   FERRITIN 121 07/10/2013 1851    3. Nausea - Patient reported nausea associated with chest pain. Lipase 19 (wnl). We gave Zofran 4mg  IV or po q6h prn. She was discharged with a prescription for Zofran tabs.   4. HTN - Well controlled on home lisinopril 20 mg daily.   5. GERD - We continued home protonix 40 mg daily.   6.  HLD - We continued home simvastatin 20 mg daily.    Discharge Vitals:   BP 127/74  Pulse 69  Temp(Src) 98 F (36.7 C) (Oral)  Resp 17  Ht 4\' 11"  (1.499 m)  Wt 143 lb 9.6 oz (65.137 kg)  BMI 28.99 kg/m2  SpO2 99%  Discharge Labs:  Results for orders placed during the hospital encounter of 07/10/13 (from the past 24 hour(s))  VITAMIN B12     Status: None   Collection Time    07/10/13  6:51 PM      Result Value Range   Vitamin B-12 608  211 - 911 pg/mL  RETICULOCYTES     Status: None   Collection Time    07/10/13  6:51 PM  Result Value Range   Retic Ct Pct 0.8  0.4 - 3.1 %   RBC. 4.29  3.87 - 5.11 MIL/uL   Retic Count, Manual 34.3  19.0 - 186.0 K/uL  IRON AND TIBC     Status: Abnormal   Collection Time    07/10/13  6:51 PM      Result Value Range   Iron 62  42 - 135 ug/dL   TIBC 321  250 - 470 ug/dL   Saturation Ratios 19 (*) 20 - 55 %   UIBC 259  125 - 400 ug/dL  FOLATE     Status: None   Collection Time    07/10/13  6:51 PM      Result Value Range   Folate >20.0    FERRITIN     Status: None   Collection Time    07/10/13  6:51 PM      Result Value Range   Ferritin 121  10 - 291 ng/mL  TROPONIN I     Status: None   Collection Time    07/10/13 10:03 PM      Result Value Range   Troponin I <0.30  <0.30 ng/mL  LIPASE, BLOOD     Status: None   Collection Time    07/10/13 10:03 PM      Result Value Range   Lipase 19  11 - 59 U/L  CBC     Status: Abnormal   Collection Time    07/11/13  4:24 AM      Result Value Range   WBC 4.8  4.0 - 10.5 K/uL   RBC 4.09  3.87 - 5.11 MIL/uL   Hemoglobin 9.1 (*) 12.0 - 15.0 g/dL   HCT 29.0 (*) 36.0 - 46.0 %   MCV 70.9 (*) 78.0 - 100.0 fL   MCH 22.2 (*) 26.0 - 34.0 pg   MCHC 31.4  30.0 - 36.0 g/dL   RDW 13.3  11.5 - 15.5 %   Platelets 247  150 - 400 K/uL  TROPONIN I     Status: None   Collection Time    07/11/13  4:24 AM      Result Value Range   Troponin I <0.30  <0.30 ng/mL  TROPONIN I     Status: None    Collection Time    07/11/13  8:35 AM      Result Value Range   Troponin I <0.30  <0.30 ng/mL    Signed: Lesly Dukes, MD 07/11/2013, 3:46 PM   Time Spent on Discharge: 35 minutes Services Ordered on Discharge: None Equipment Ordered on Discharge: None

## 2013-07-11 NOTE — Progress Notes (Signed)
UR completed 

## 2013-07-11 NOTE — Progress Notes (Signed)
Reviewed discharge instructions with patient and she stated her understanding.  Discharged home with family.  Smoking cessation provided to patient.  Sanda Linger

## 2013-07-13 NOTE — Discharge Summary (Signed)
  Date: 07/13/2013  Patient name: [name]  Medical record number: [id number]  Date of birth: May 27, 1961   This patient has been discussed with the house staff. Please see their note for complete details. I concur with their findings and plan.  Dominic Pea, DO, [facility] 07/13/2013, 9:48 PM

## 2013-07-16 ENCOUNTER — Telehealth: Payer: Self-pay | Admitting: *Deleted

## 2013-07-16 NOTE — Telephone Encounter (Signed)
Pt informed and will let us know how she is feeling.

## 2013-07-16 NOTE — Telephone Encounter (Signed)
Kidney fxn OK so can use NSAID to myalgias, fever. For cough, any OTC cough med if bad enough that she needs tx. Could just be 2/2 vaccination and will resolve in 48 hrs. If she has high fever, unable to keep down fluids, or cont past 72 hrs, she may need to let us know. She could have influ.

## 2013-07-16 NOTE — Telephone Encounter (Signed)
Pt called stating she was d/c from hospital on 1/16, s/p Chest Pain. She states she received both the Flu and pneumonia vaccines while in the hospital. Yesterday she started having  Chills, fever, sweating, non-productive cough, body aches and feeing uncomfortable.   Denies N/vomiting.   She has not taken any tylenol/advil because of her history of HTN, she is asking for advise on what she can take for her symptoms.  HFU on 1/ 27th Pt # [phone]

## 2013-07-22 ENCOUNTER — Encounter: Payer: Self-pay | Admitting: Internal Medicine

## 2013-07-22 ENCOUNTER — Ambulatory Visit (INDEPENDENT_AMBULATORY_CARE_PROVIDER_SITE_OTHER): Payer: No Typology Code available for payment source | Admitting: Internal Medicine

## 2013-07-22 VITALS — BP 124/87 | HR 79 | Temp 98.7°F | Ht 59.0 in | Wt 143.8 lb

## 2013-07-22 DIAGNOSIS — D649 Anemia, unspecified: Secondary | ICD-10-CM

## 2013-07-22 DIAGNOSIS — I10 Essential (primary) hypertension: Secondary | ICD-10-CM

## 2013-07-22 DIAGNOSIS — E785 Hyperlipidemia, unspecified: Secondary | ICD-10-CM

## 2013-07-22 DIAGNOSIS — K219 Gastro-esophageal reflux disease without esophagitis: Secondary | ICD-10-CM

## 2013-07-22 DIAGNOSIS — Z0000 Encounter for general adult medical examination without abnormal findings: Secondary | ICD-10-CM

## 2013-07-22 LAB — CBC
HCT: 33.4 % — ABNORMAL LOW (ref 36.0–46.0)
HEMOGLOBIN: 10.2 g/dL — AB (ref 12.0–15.0)
MCH: 21.9 pg — AB (ref 26.0–34.0)
MCHC: 30.5 g/dL (ref 30.0–36.0)
MCV: 71.7 fL — ABNORMAL LOW (ref 78.0–100.0)
Platelets: 306 10*3/uL (ref 150–400)
RBC: 4.66 MIL/uL (ref 3.87–5.11)
RDW: 15.5 % (ref 11.5–15.5)
WBC: 4.7 10*3/uL (ref 4.0–10.5)

## 2013-07-22 NOTE — Assessment & Plan Note (Signed)
She has been off statin for one month 2/2 cost. Reviewed all availalbe FLP, and highest LDL was 130 which is at goal. Told her she can stay off statin and return for lab visit for FLP in couple of months.

## 2013-07-22 NOTE — Patient Instructions (Addendum)
1. For the congestion, get an Over the Counter med with pseudoephedrine or other decongestant. Ask the pharmacist if you have trouble. It may make you sleepy 2. Please come March or April for a cholesterol check - see lab only. I will mail you your results once I get them 3. The cough will take up to 6 weeks to go away. 4. See me in 6 - 12 months 5. Call me when you get insurance to discuss colonoscopy.

## 2013-07-22 NOTE — Assessment & Plan Note (Addendum)
Due to slight decrease from baseline in hospital, will check CBC today. Instructed her to call once gets bill to get discount. Cont her FESO4 without SE. Anemia panel had improved on recheck.

## 2013-07-22 NOTE — Assessment & Plan Note (Signed)
Cont her PPI

## 2013-07-22 NOTE — Progress Notes (Signed)
   Subjective:    Patient ID: [name], female    DOB: [birth year], 53 y.o.   MRN: [id number]  HPI  Please see the A&P for the status of the pt's chronic medical problems.   Review of Systems  Constitutional: Negative for fever, chills and unexpected weight change.  HENT: Positive for sinus pressure. Negative for rhinorrhea, sneezing and sore throat.   Eyes: Positive for discharge. Negative for pain and itching.  Respiratory: Positive for cough. Negative for shortness of breath.   Cardiovascular: Negative for chest pain and palpitations.  Gastrointestinal: Negative for vomiting, abdominal pain and diarrhea.  Genitourinary: Negative for difficulty urinating.  Musculoskeletal: Positive for arthralgias. Negative for back pain.  Neurological: Positive for headaches.  Psychiatric/Behavioral: Positive for sleep disturbance.       Objective:   Physical Exam  Constitutional: She is oriented to person, place, and time. She appears well-developed and well-nourished. No distress.  HENT:  Head: Normocephalic and atraumatic.  Right Ear: External ear normal.  Left Ear: External ear normal.  Nose: Nose normal.  B proptoysis  Eyes: Conjunctivae and EOM are normal. Right eye exhibits no discharge. Left eye exhibits no discharge. No scleral icterus.  Neck: Normal range of motion. Neck supple. No thyromegaly present.  Cardiovascular: Normal rate, regular rhythm and normal heart sounds.   Pulmonary/Chest: Effort normal and breath sounds normal. No respiratory distress.  Lymphadenopathy:    She has no cervical adenopathy.  Neurological: She is alert and oriented to person, place, and time.  Skin: Skin is warm and dry. She is not diaphoretic.  Psychiatric: She has a normal mood and affect. Her behavior is normal. Judgment and thought content normal.          Assessment & Plan:

## 2013-07-22 NOTE — Assessment & Plan Note (Addendum)
We referred her to the [facility] colon cancer study. If they do not provide her with stool cards, we will. She will call me when she gets insurance for colonoscopy.    Post viral cough and congestion. Shortly after D/C, got flu or FLI. Only sxs left are non productive cough and sinus and chest congestion. I gave her letter that she could work. Suggested decongestant and time.

## 2013-07-22 NOTE — Assessment & Plan Note (Signed)
Great control on Lisinopril 20. Cont.

## 2013-07-23 ENCOUNTER — Encounter: Payer: Self-pay | Admitting: Internal Medicine

## 2013-08-22 ENCOUNTER — Other Ambulatory Visit: Payer: No Typology Code available for payment source

## 2013-08-22 DIAGNOSIS — Z1211 Encounter for screening for malignant neoplasm of colon: Secondary | ICD-10-CM

## 2013-08-22 LAB — POC HEMOCCULT BLD/STL (HOME/3-CARD/SCREEN)
Card #2 Fecal Occult Blod, POC: NEGATIVE
Card #3 Fecal Occult Blood, POC: NEGATIVE
Fecal Occult Blood, POC: NEGATIVE

## 2013-09-12 ENCOUNTER — Ambulatory Visit (INDEPENDENT_AMBULATORY_CARE_PROVIDER_SITE_OTHER): Payer: No Typology Code available for payment source | Admitting: Internal Medicine

## 2013-09-12 ENCOUNTER — Encounter (HOSPITAL_COMMUNITY): Payer: Self-pay | Admitting: Emergency Medicine

## 2013-09-12 ENCOUNTER — Encounter: Payer: Self-pay | Admitting: Internal Medicine

## 2013-09-12 ENCOUNTER — Telehealth: Payer: Self-pay | Admitting: *Deleted

## 2013-09-12 ENCOUNTER — Emergency Department (HOSPITAL_COMMUNITY)
Admission: EM | Admit: 2013-09-12 | Discharge: 2013-09-12 | Discharge status: Against Medical Advice | Payer: No Typology Code available for payment source | Attending: Emergency Medicine | Admitting: Emergency Medicine

## 2013-09-12 VITALS — BP 133/86 | HR 91 | Temp 97.5°F | Ht 59.0 in | Wt 146.9 lb

## 2013-09-12 DIAGNOSIS — H5789 Other specified disorders of eye and adnexa: Secondary | ICD-10-CM | POA: Insufficient documentation

## 2013-09-12 DIAGNOSIS — K219 Gastro-esophageal reflux disease without esophagitis: Secondary | ICD-10-CM | POA: Insufficient documentation

## 2013-09-12 DIAGNOSIS — H5710 Ocular pain, unspecified eye: Secondary | ICD-10-CM

## 2013-09-12 DIAGNOSIS — Z79899 Other long term (current) drug therapy: Secondary | ICD-10-CM | POA: Insufficient documentation

## 2013-09-12 DIAGNOSIS — Z8639 Personal history of other endocrine, nutritional and metabolic disease: Secondary | ICD-10-CM | POA: Insufficient documentation

## 2013-09-12 DIAGNOSIS — F17200 Nicotine dependence, unspecified, uncomplicated: Secondary | ICD-10-CM | POA: Insufficient documentation

## 2013-09-12 DIAGNOSIS — I10 Essential (primary) hypertension: Secondary | ICD-10-CM | POA: Insufficient documentation

## 2013-09-12 DIAGNOSIS — IMO0002 Reserved for concepts with insufficient information to code with codable children: Secondary | ICD-10-CM | POA: Insufficient documentation

## 2013-09-12 DIAGNOSIS — Z862 Personal history of diseases of the blood and blood-forming organs and certain disorders involving the immune mechanism: Secondary | ICD-10-CM | POA: Insufficient documentation

## 2013-09-12 DIAGNOSIS — H5712 Ocular pain, left eye: Secondary | ICD-10-CM

## 2013-09-12 NOTE — ED Notes (Signed)
Pt awoke this am with pain and redness in her left eye , pt states that she thinks she may have something in it

## 2013-09-12 NOTE — Telephone Encounter (Signed)
Pt calls c/o awaking this am to a swollen, red, painful eye- was a little tender yesterday, itchy. Desires appt. Advance dr Algis Liming

## 2013-09-12 NOTE — Progress Notes (Signed)
Case discussed with Dr. Sadek soon after the resident saw the patient.  We reviewed the resident's history and exam and pertinent patient test results.  I agree with the assessment, diagnosis, and plan of care documented in the resident's note. 

## 2013-09-12 NOTE — ED Notes (Signed)
Pt at desk requesting to see provider, PA to bedside and patient refused to stay for exam, asking how long exam would take, pt refused to wait, encouraged to stay by staff and PA Carlota Raspberry.

## 2013-09-12 NOTE — ED Provider Notes (Signed)
CSN: [id number]     Arrival date & time 09/12/13  1421 History  This chart was scribed for non-physician practitioner Linus Mako, PA-C working with Ezequiel Essex, MD by Ludger Nutting, ED Scribe. This patient was seen in room TR04C/TR04C and the patient's care was started at 3:47 PM.     Chief Complaint  Patient presents with  . Eye Pain     LEVEL 5 CAVEAT-Pt uncooperative  The history is provided by the patient. No language interpreter was used.    HPI Comments: [name] is a 53 y.o. female who presents to the Emergency Department complaining of left eye irritation. Patient was irritated and was unable to obtain full HPI. Pt sent from UC for irritation to left eye with pain and redness. Patient thinks she might have something in it. Pt is upset about waiting 1 hour because she was told by UC that she would be seen immediately and discharged with medication. Patient wants to know how long exam is going to last and is upset that I am unable to tell her specific time. At this time the patient has decided to leave AMA.   Past Medical History  Diagnosis Date  . Hyperlipidemia   . Hiatal hernia   . GERD (gastroesophageal reflux disease)   . Hypertension    Past Surgical History  Procedure Laterality Date  . Abdominal hysterectomy  12/09    Secondary to fibroids  . Cesarean section     Family History  Problem Relation Age of Onset  . Cancer Mother 45    pancreatic cancer  . Cancer Father 49    throat cancer  . Cancer Maternal Aunt 60    breast cancer  . Cancer Other     Died  from Colon cancer in 60's   History  Substance Use Topics  . Smoking status: Current Every Day Smoker -- 0.10 packs/day    Types: Cigarettes  . Smokeless tobacco: Not on file     Comment: Down to 1 cig/day.  . Alcohol Use: Yes     Comment: occasionally   OB History   Grav Para Term Preterm Abortions TAB SAB Ect Mult Living                 Review of Systems  Unable to perform ROS:  Other      Allergies  Aspirin and Hydromorphone hcl  Home Medications   Current Outpatient Rx  Name  Route  Sig  Dispense  Refill  . ferrous sulfate (IRON SUPPLEMENT) 325 (65 FE) MG tablet   Oral   Take 325 mg by mouth 3 (three) times daily.          Marland Kitchen lisinopril (PRINIVIL,ZESTRIL) 20 MG tablet   Oral   Take 1 tablet (20 mg total) by mouth daily.   30 tablet   4   . omeprazole (PRILOSEC) 20 MG capsule   Oral   Take 1 capsule (20 mg total) by mouth daily.   30 capsule   4    BP 135/86  Pulse 95  Temp(Src) 98.5 F (36.9 C) (Oral)  Resp 18  SpO2 100% Physical Exam  Nursing note and vitals reviewed. Constitutional: She is oriented to person, place, and time. She appears well-developed and well-nourished.  HENT:  Head: Normocephalic and atraumatic.  Eyes: Left conjunctiva is injected.  Cardiovascular: Normal rate.   Pulmonary/Chest: Effort normal.  Neurological: She is alert and oriented to person, place, and time.  Skin: Skin  is warm and dry.  Psychiatric: She is agitated.    ED Course  Procedures (including critical care time)  DIAGNOSTIC STUDIES: Oxygen Saturation is 100% on RA, normal by my interpretation.    COORDINATION OF CARE: 3:47 PM Patient left AMA.   Labs Review Labs Reviewed - No data to display Imaging Review No results found.   EKG Interpretation None      MDM   Final diagnoses:  Eye redness    Unable to do exam due to patient wanting to know exactly how long the exam would take. I had no yet taken a full HPI nor am aware of what her symptoms are. I made her aware of this and she decided she wanted to leave.  Patient signed out AMA.Marland Kitchen She would not wait to let me tell her the risks of leaving against medical advice. She walked out in the middle of my talking to her and trying to convince her to let me look at it. Patient total wait in the ED from check in to walking out was 1 hour and 22 minutes.  I personally performed the  services described in this documentation, which was scribed in my presence. The recorded information has been reviewed and is accurate.   Linus Mako, PA-C 09/12/13 1556

## 2013-09-12 NOTE — ED Provider Notes (Signed)
Medical screening examination/treatment/procedure(s) were performed by non-physician practitioner and as supervising physician I was immediately available for consultation/collaboration.   EKG Interpretation None       Ezequiel Essex, MD 09/12/13 [phone]

## 2013-09-12 NOTE — Telephone Encounter (Signed)
Agree with appt Thanks 
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
## 2013-09-12 NOTE — Assessment & Plan Note (Signed)
Spoke with Dr. Nehemiah Massed of opthalmology in regards to acute red eye. He indicated that his biggest concern was a chemical keratitis and that irrigation with lactated Ringer's or artificial tears would be appropriate along with possible antibiotic drops and that evaluation in his office could be completed early next week. 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  This was discussed with the patient and it was decided that the patient should present to the emergency room for a full slit lamp evaluation and for seen drops to assess her cornea she continued to have foreign body sensation in her eye and also allow for safe irrigation with lactated Ringer or normal saline in the emergency room.  Greater than 20 minutes was spent with 50% of that time spent on counseling and coordination of care.

## 2013-10-02 ENCOUNTER — Ambulatory Visit: Payer: No Typology Code available for payment source | Admitting: Internal Medicine

## 2013-10-02 ENCOUNTER — Encounter (HOSPITAL_COMMUNITY): Payer: Self-pay | Admitting: Emergency Medicine

## 2013-10-02 ENCOUNTER — Emergency Department (HOSPITAL_COMMUNITY)
Admission: EM | Admit: 2013-10-02 | Discharge: 2013-10-02 | Disposition: A | Discharge status: Home / Self Care | Payer: No Typology Code available for payment source | Attending: Emergency Medicine | Admitting: Emergency Medicine

## 2013-10-02 ENCOUNTER — Emergency Department (HOSPITAL_COMMUNITY)
Admission: EM | Admit: 2013-10-02 | Discharge: 2013-10-02 | Disposition: A | Discharge status: Home / Self Care | Payer: No Typology Code available for payment source | Source: Home / Self Care

## 2013-10-02 DIAGNOSIS — Z9889 Other specified postprocedural states: Secondary | ICD-10-CM | POA: Insufficient documentation

## 2013-10-02 DIAGNOSIS — I10 Essential (primary) hypertension: Secondary | ICD-10-CM | POA: Insufficient documentation

## 2013-10-02 DIAGNOSIS — R112 Nausea with vomiting, unspecified: Secondary | ICD-10-CM

## 2013-10-02 DIAGNOSIS — Z79899 Other long term (current) drug therapy: Secondary | ICD-10-CM | POA: Insufficient documentation

## 2013-10-02 DIAGNOSIS — Z90710 Acquired absence of both cervix and uterus: Secondary | ICD-10-CM | POA: Insufficient documentation

## 2013-10-02 DIAGNOSIS — R1084 Generalized abdominal pain: Secondary | ICD-10-CM | POA: Insufficient documentation

## 2013-10-02 DIAGNOSIS — E785 Hyperlipidemia, unspecified: Secondary | ICD-10-CM | POA: Insufficient documentation

## 2013-10-02 DIAGNOSIS — K219 Gastro-esophageal reflux disease without esophagitis: Secondary | ICD-10-CM | POA: Insufficient documentation

## 2013-10-02 DIAGNOSIS — R109 Unspecified abdominal pain: Secondary | ICD-10-CM

## 2013-10-02 DIAGNOSIS — F17200 Nicotine dependence, unspecified, uncomplicated: Secondary | ICD-10-CM | POA: Insufficient documentation

## 2013-10-02 LAB — COMPREHENSIVE METABOLIC PANEL
ALK PHOS: 72 U/L (ref 39–117)
ALT: 34 U/L (ref 0–35)
AST: 21 U/L (ref 0–37)
Albumin: 3.5 g/dL (ref 3.5–5.2)
BUN: 17 mg/dL (ref 6–23)
CO2: 22 mEq/L (ref 19–32)
Calcium: 9 mg/dL (ref 8.4–10.5)
Chloride: 105 mEq/L (ref 96–112)
Creatinine, Ser: 0.61 mg/dL (ref 0.50–1.10)
GFR calc Af Amer: >90 mL/min (ref >90)
GFR calc non Af Amer: >90 mL/min (ref >90)
GLUCOSE: 122 mg/dL — AB (ref 70–99)
POTASSIUM: 3.2 meq/L — AB (ref 3.7–5.3)
SODIUM: 144 meq/L (ref 137–147)
TOTAL PROTEIN: 6.9 g/dL (ref 6.0–8.3)
Total Bilirubin: 0.3 mg/dL (ref 0.3–1.2)

## 2013-10-02 LAB — CBC WITH DIFFERENTIAL/PLATELET
BASOS PCT: 0 % (ref 0–1)
Basophils Absolute: 0 10*3/uL (ref 0.0–0.1)
Eosinophils Absolute: 0.2 10*3/uL (ref 0.0–0.7)
Eosinophils Relative: 3 % (ref 0–5)
HCT: 33.8 % — ABNORMAL LOW (ref 36.0–46.0)
HEMOGLOBIN: 10.9 g/dL — AB (ref 12.0–15.0)
LYMPHS PCT: 18 % (ref 12–46)
Lymphs Abs: 1.3 10*3/uL (ref 0.7–4.0)
MCH: 22.6 pg — ABNORMAL LOW (ref 26.0–34.0)
MCHC: 32.2 g/dL (ref 30.0–36.0)
MCV: 70.1 fL — ABNORMAL LOW (ref 78.0–100.0)
Monocytes Absolute: 0.5 10*3/uL (ref 0.1–1.0)
Monocytes Relative: 7 % (ref 3–12)
Neutro Abs: 5.2 10*3/uL (ref 1.7–7.7)
Neutrophils Relative %: 72 % (ref 43–77)
Platelets: 294 10*3/uL (ref 150–400)
RBC: 4.82 MIL/uL (ref 3.87–5.11)
RDW: 14.2 % (ref 11.5–15.5)
WBC: 7.2 10*3/uL (ref 4.0–10.5)

## 2013-10-02 MED ORDER — ONDANSETRON 4 MG PO TBDP
4.0000 mg | ORAL_TABLET | Freq: Once | ORAL | Status: AC
  Administered 2013-10-02: 4 mg via ORAL
  Filled 2013-10-02: qty 1

## 2013-10-02 MED ORDER — DICYCLOMINE HCL 20 MG PO TABS: 20.0000 mg | ORAL_TABLET | Freq: Two times a day (BID) | ORAL | Status: DC

## 2013-10-02 MED ORDER — ONDANSETRON 4 MG PO TBDP: 4.0000 mg | ORAL_TABLET | Freq: Three times a day (TID) | ORAL | Status: DC | PRN

## 2013-10-02 MED ORDER — DICYCLOMINE HCL 10 MG PO CAPS
10.0000 mg | ORAL_CAPSULE | Freq: Once | ORAL | Status: AC
  Administered 2013-10-02: 10 mg via ORAL
  Filled 2013-10-02: qty 1

## 2013-10-02 NOTE — Discharge Instructions (Signed)
Take the prescribed medication as directed. °Follow-up with your primary care physician. °Return to the ED for new or worsening symptoms. ° °

## 2013-10-02 NOTE — ED Provider Notes (Signed)
CSN: [id number]     Arrival date & time 10/02/13  1125 History   First MD Initiated Contact with Patient 10/02/13 1220     Chief Complaint  Patient presents with  . Abdominal Pain     (Consider location/radiation/quality/duration/timing/severity/associated sxs/prior Treatment) Patient is a 53 y.o. female presenting with abdominal pain. The history is provided by the patient and medical records.  Abdominal Pain Associated symptoms: diarrhea, nausea and vomiting    This is a 53 y.o. F with past medical history significant for hypertension, hyperlipidemia,, hiatal hernia, presenting to the ED for generalized abdominal cramping, nausea, vomiting, and diarrhea since last night.  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  Patient states throughout the night she had multiple episodes of nonbloody, nonbilious emesis and nonbloody diarrhea. She states symptoms seem to improve this morning she attempted to go to work she had recurrent nausea and was told to leave to be evaluated. Patient states she has been able to tolerate some saltine crackers without recurrent vomiting. She continues to have some mild nausea and abdominal cramping.  She denies any fevers or chills. Abdominal surgeries include C-section and abdominal hysterectomy.  No intervention tried PTA.  VS stable on arrival.  Past Medical History  Diagnosis Date  . Hyperlipidemia   . Hiatal hernia   . GERD (gastroesophageal reflux disease)   . Hypertension    Past Surgical History  Procedure Laterality Date  . Abdominal hysterectomy  12/09    Secondary to fibroids  . Cesarean section     Family History  Problem Relation Age of Onset  . Cancer Mother 30    pancreatic cancer  . Cancer Father 7    throat cancer  . Cancer Maternal Aunt 60    breast cancer  . Cancer Other     Died  from Colon cancer in 60's   History  Substance Use Topics  . Smoking status: Current Every Day Smoker --  0.10 packs/day    Types: Cigarettes  . Smokeless tobacco: Not on file     Comment: Down to 1 cig/day.  . Alcohol Use: Yes     Comment: occasionally   OB History   Grav Para Term Preterm Abortions TAB SAB Ect Mult Living                 Review of Systems  Gastrointestinal: Positive for nausea, vomiting, abdominal pain and diarrhea.  All other systems reviewed and are negative.     Allergies  Aspirin and Hydromorphone hcl  Home Medications   Current Outpatient Rx  Name  Route  Sig  Dispense  Refill  . ferrous sulfate (IRON SUPPLEMENT) 325 (65 FE) MG tablet   Oral   Take 325 mg by mouth 3 (three) times daily.          Marland Kitchen lisinopril (PRINIVIL,ZESTRIL) 20 MG tablet   Oral   Take 1 tablet (20 mg total) by mouth daily.   30 tablet   4   . omeprazole (PRILOSEC) 20 MG capsule   Oral   Take 1 capsule (20 mg total) by mouth daily.   30 capsule   4    BP 127/76  Pulse 92  Temp(Src) 98 F (36.7 C) (Oral)  Resp 18  Ht 4\' 11"  (1.499 m)  Wt 147 lb (66.679 kg)  BMI 29.67 kg/m2  SpO2 98%  Physical Exam  Nursing note and vitals reviewed. Constitutional: She is oriented to person, place,  and time. She appears well-developed and well-nourished.  HENT:  Head: Normocephalic and atraumatic.  Mouth/Throat: Uvula is midline, oropharynx is clear and moist and mucous membranes are normal.  Moist mucous membranes  Eyes: Conjunctivae and EOM are normal. Pupils are equal, round, and reactive to light.  Neck: Normal range of motion. Neck supple.  Cardiovascular: Normal rate, regular rhythm and normal heart sounds.   Pulmonary/Chest: Effort normal and breath sounds normal. No respiratory distress. She has no wheezes.  Abdominal: Soft. Bowel sounds are normal. There is no tenderness. There is no rigidity and no guarding.  Abdomen soft, nondistended, no peritoneal signs  Musculoskeletal: Normal range of motion. She exhibits no edema.  Neurological: She is alert and oriented to  person, place, and time.  Skin: Skin is warm and dry. She is not diaphoretic.  Psychiatric: She has a normal mood and affect.    ED Course  Procedures (including critical care time) Labs Review Labs Reviewed  CBC WITH DIFFERENTIAL - Abnormal; Notable for the following:    Hemoglobin 10.9 (*)    HCT 33.8 (*)    MCV 70.1 (*)    MCH 22.6 (*)    All other components within normal limits  COMPREHENSIVE METABOLIC PANEL - Abnormal; Notable for the following:    Potassium 3.2 (*)    Glucose, Bld 122 (*)    All other components within normal limits   Imaging Review No results found.   EKG Interpretation None      MDM   Final diagnoses:  Nausea & vomiting  Abdominal cramping   53 year-old female with nausea vomiting diarrhea his after eating seafood Chinese take out.  On exam patient is afebrile and overall nontoxic appearing. Her mucous membranes are moist and she does not appear dehydrated. Her abdominal exam is benign. Basic labs were obtained which are reassuring. She was given Zofran and Bentyl in the emergency department with good improvement of her symptoms. She has tolerated PO fluids without vomiting.  Possible viral gastroenteritis vs food poisoning.  She will be discharged home and instructed on supportive care. She will followup with her primary care physician.  Discussed plan with pt, she agreed.  Return precautions given for new or worsening sx.  Larene Pickett, PA-C 10/02/13 [phone]

## 2013-10-02 NOTE — ED Notes (Signed)
PT with acute onset diffuse abdominal pain, vomiting and diarrhea at 4 am.  PT went to work, but had to leave d/t pain.  Denies urinary s/s.   Also c/o cold chills and sweats.

## 2013-10-02 NOTE — ED Provider Notes (Signed)
Medical screening examination/treatment/procedure(s) were performed by non-physician practitioner and as supervising physician I was immediately available for consultation/collaboration.  Delores Edelstein L Anapaola Kinsel, MD 10/02/13 1713 

## 2013-10-02 NOTE — ED Notes (Addendum)
Pt reports N/V/D that started this AM. Pt states "I ate chinese yesterday, and it didn't really sit well." PT denies any V/D since 0800 this morning. Denies abdominal pain, but states "my stomach feels crampy." Reports mild nausea. PT reports eating saltines earlier that have stayed down.

## 2013-10-22 NOTE — Addendum Note (Signed)
Addended by: Truddie Crumble on: 10/22/2013 04:21 PM   Modules accepted: Orders

## 2013-11-11 ENCOUNTER — Encounter: Payer: Self-pay | Admitting: Internal Medicine

## 2013-11-11 ENCOUNTER — Ambulatory Visit (INDEPENDENT_AMBULATORY_CARE_PROVIDER_SITE_OTHER): Payer: No Typology Code available for payment source | Admitting: Internal Medicine

## 2013-11-11 VITALS — BP 174/112 | HR 84 | Temp 98.1°F | Wt 148.0 lb

## 2013-11-11 DIAGNOSIS — Z0000 Encounter for general adult medical examination without abnormal findings: Secondary | ICD-10-CM

## 2013-11-11 DIAGNOSIS — E785 Hyperlipidemia, unspecified: Secondary | ICD-10-CM

## 2013-11-11 DIAGNOSIS — M549 Dorsalgia, unspecified: Secondary | ICD-10-CM

## 2013-11-11 DIAGNOSIS — D649 Anemia, unspecified: Secondary | ICD-10-CM

## 2013-11-11 DIAGNOSIS — M25519 Pain in unspecified shoulder: Secondary | ICD-10-CM | POA: Insufficient documentation

## 2013-11-11 DIAGNOSIS — E663 Overweight: Secondary | ICD-10-CM

## 2013-11-11 DIAGNOSIS — I10 Essential (primary) hypertension: Secondary | ICD-10-CM

## 2013-11-11 DIAGNOSIS — M25512 Pain in left shoulder: Secondary | ICD-10-CM

## 2013-11-11 LAB — GLUCOSE, CAPILLARY: Glucose-Capillary: 87 mg/dL (ref 70–99)

## 2013-11-11 MED ORDER — CYCLOBENZAPRINE HCL 10 MG PO TABS: 10.0000 mg | ORAL_TABLET | Freq: Three times a day (TID) | ORAL | Status: DC | PRN

## 2013-11-11 NOTE — Assessment & Plan Note (Signed)
I do not need to get any other blood work today so will wait to get FLP.

## 2013-11-11 NOTE — Assessment & Plan Note (Signed)
1 week low back pain. Started wtih swelling on R side that has resolved. Now with lower mid back pain. Worse when stands or sits or leans over. No injury or overuse prior to pain. No leg weakness. No pain down leg. Likely MS pain. Cont with Aleve (helps a lot) and add Flexeril at bedtime. Cont to move but no twisting, vacuuming, etc. Light duty at work. Explained will take up to 6 weeks to improve.

## 2013-11-11 NOTE — Patient Instructions (Signed)
Back Pain, Adult Please see me in 4 weeks Light duty at work Call me if the hot flashes become unbearable.   Low back pain is very common. About 1 in 5 people have back pain.The cause of low back pain is rarely dangerous. The pain often gets better over time.About half of people with a sudden onset of back pain feel better in just 2 weeks. About 8 in 10 people feel better by 6 weeks.  CAUSES Some common causes of back pain include:  Strain of the muscles or ligaments supporting the spine.  Wear and tear (degeneration) of the spinal discs.  Arthritis.  Direct injury to the back. DIAGNOSIS Most of the time, the direct cause of low back pain is not known.However, back pain can be treated effectively even when the exact cause of the pain is unknown.Answering your caregiver's questions about your overall health and symptoms is one of the most accurate ways to make sure the cause of your pain is not dangerous. If your caregiver needs more information, he or she may order lab work or imaging tests (X-rays or MRIs).However, even if imaging tests show changes in your back, this usually does not require surgery. HOME CARE INSTRUCTIONS For many people, back pain returns.Since low back pain is rarely dangerous, it is often a condition that people can learn to [facility] their own.   Remain active. It is stressful on the back to sit or stand in one place. Do not sit, drive, or stand in one place for more than 30 minutes at a time. Take short walks on level surfaces as soon as pain allows.Try to increase the length of time you walk each day.  Do not stay in bed.Resting more than 1 or 2 days can delay your recovery.  Do not avoid exercise or work.Your body is made to move.It is not dangerous to be active, even though your back may hurt.Your back will likely heal faster if you return to being active before your pain is gone.  Pay attention to your body when you bend and lift. Many people have  less discomfortwhen lifting if they bend their knees, keep the load close to their bodies,and avoid twisting. Often, the most comfortable positions are those that put less stress on your recovering back.  Find a comfortable position to sleep. Use a firm mattress and lie on your side with your knees slightly bent. If you lie on your back, put a pillow under your knees.  Only take over-the-counter or prescription medicines as directed by your caregiver. Over-the-counter medicines to reduce pain and inflammation are often the most helpful.Your caregiver may prescribe muscle relaxant drugs.These medicines help dull your pain so you can more quickly return to your normal activities and healthy exercise.  Put ice on the injured area.  Put ice in a plastic bag.  Place a towel between your skin and the bag.  Leave the ice on for 15-20 minutes, 03-04 times a day for the first 2 to 3 days. After that, ice and heat may be alternated to reduce pain and spasms.  Ask your caregiver about trying back exercises and gentle massage. This may be of some benefit.  Avoid feeling anxious or stressed.Stress increases muscle tension and can worsen back pain.It is important to recognize when you are anxious or stressed and learn ways to manage it.Exercise is a great option. SEEK MEDICAL CARE IF:  You have pain that is not relieved with rest or medicine.  You have pain  that does not improve in 1 week.  You have new symptoms.  You are generally not feeling well. SEEK IMMEDIATE MEDICAL CARE IF:   You have pain that radiates from your back into your legs.  You develop new bowel or bladder control problems.  You have unusual weakness or numbness in your arms or legs.  You develop nausea or vomiting.  You develop abdominal pain.  You feel faint. Document Released: 06/12/2005 Document Revised: 12/12/2011 Document Reviewed: 10/31/2010 [facility] Patient Information 2014 [place], [place].

## 2013-11-11 NOTE — Progress Notes (Signed)
   Subjective:    Patient ID: [name], female    DOB: [birth year], 53 y.o.   MRN: [id number]  Back Pain Pertinent negatives include no headaches.    Please see the A&P for the status of the pt's chronic medical problems.   Review of Systems  Constitutional: Positive for diaphoresis and activity change. Negative for appetite change.  HENT:       No nose bleeds  Eyes: Positive for discharge.  Musculoskeletal: Positive for back pain, gait problem and myalgias.  Neurological: Negative for light-headedness and headaches.  Psychiatric/Behavioral: Positive for sleep disturbance.       Objective:   Physical Exam  Constitutional: She is oriented to person, place, and time. She appears well-developed and well-nourished. No distress.  HENT:  Head: Normocephalic and atraumatic.  Right Ear: External ear normal.  Left Ear: External ear normal.  Nose: Nose normal.  Eyes: Conjunctivae are normal.  Mild stable proptosis.  Musculoskeletal:  No tenderness to palp L shoulder. Decreased ROM on L. + empty can test. No tenderness to palp of lumbar muscles. No erythema or swelling. Able to touch toes. Neg straight leg raise.  Neurological: She is alert and oriented to person, place, and time.  Skin: Skin is warm and dry. She is not diaphoretic.  Acanthosis nigricans on neck  Psychiatric: She has a normal mood and affect. Her behavior is normal. Judgment and thought content normal.          Assessment & Plan:

## 2013-11-11 NOTE — Assessment & Plan Note (Signed)
She has had shoulder pain for yrs. She has to use her R arm to lift the L arm in the AM. No weakness - not dropping things. Gets tingling in her hand and fingers occasionally. She had plain film 03/2013 that was negative and cervical MRI 2008 "Minor disc bulges throughout the cervical region but no sign of a dominant herniation or any neural compressive stenosis." Her exam shows sig decreased ROM. Likely rotator cuff. Agrees to go to sports med.

## 2013-11-11 NOTE — Assessment & Plan Note (Signed)
Hor flashes for about 2 months. Soaks sheets at night and has to change night gown and sheets. Hot flash during day and has to step away from job for few min to fan herself. We dicussed lab work would confirm but likely waste of money. No tx for now but if disabling, she is to call and I will Rx non-hormonal med first. We dicussed risks / benefits of HRT.

## 2013-11-11 NOTE — Assessment & Plan Note (Signed)
Sig elevated today but in pain and on NSAID. All prior have been well controlled on lisinopril 20. Will leave med as is and recheck 3-5 weeks.

## 2013-11-11 NOTE — Assessment & Plan Note (Signed)
We reviewed her results. Her HgB still low but OK. Ferritin now nl.

## 2013-11-20 ENCOUNTER — Telehealth: Payer: Self-pay | Admitting: *Deleted

## 2013-11-20 MED ORDER — TRAMADOL HCL 50 MG PO TABS: 50.0000 mg | ORAL_TABLET | Freq: Four times a day (QID) | ORAL | Status: DC | PRN

## 2013-11-20 NOTE — Telephone Encounter (Signed)
Pt called with c/o low  back pain. Seen on 5/19 for same pain. She does state her shoulder pain has improved but her low back is quite painful.   She is exercising, using ice and heat, taking Aleve and IBU without relief.  She rates pain 8/10 and states intensity increases when she gets up and moves around.  Rates pain 8/10 .  Denies any radiation of pain.  She has an appointment with Sports Med but they will not see her until the 8th of June.  She wanted to make you aware of the pain. Pt # [id number]

## 2013-11-20 NOTE — Telephone Encounter (Signed)
Would you pls phone in tramadol?

## 2013-11-20 NOTE — Telephone Encounter (Signed)
Rx called in 

## 2013-11-20 NOTE — Telephone Encounter (Signed)
Pt states Flexeril does not help. Pt is still able to do light work but can't bend over to well. She would like to try tramadol - [facility]  Pt informed of symptoms listed and will go to ED if they occur

## 2013-11-20 NOTE — Telephone Encounter (Signed)
Pt called,no answer.

## 2013-11-20 NOTE — Telephone Encounter (Signed)
I tried to call pt but got machine. I wanted to ask her 1. Did flexeril help? 2. Still able to do light duty at work? 3. Does she need anything like tramadol (has gotten before) to last her until the sports med appt?  She needs to see MD immediately if weakness, bowel or bladder dysfunction, fever, weight loss, etc.

## 2013-12-01 ENCOUNTER — Ambulatory Visit: Payer: No Typology Code available for payment source | Admitting: Family Medicine

## 2013-12-01 NOTE — Addendum Note (Signed)
Addended by: Hulan Fray on: 12/01/2013 04:15 PM   Modules accepted: Orders

## 2013-12-09 ENCOUNTER — Encounter: Payer: Self-pay | Admitting: Internal Medicine

## 2013-12-09 ENCOUNTER — Encounter: Payer: No Typology Code available for payment source | Admitting: Internal Medicine

## 2013-12-15 ENCOUNTER — Ambulatory Visit: Payer: No Typology Code available for payment source | Admitting: Family Medicine

## 2013-12-19 ENCOUNTER — Ambulatory Visit: Payer: No Typology Code available for payment source | Admitting: Internal Medicine

## 2013-12-23 ENCOUNTER — Telehealth: Payer: Self-pay | Admitting: *Deleted

## 2013-12-23 NOTE — Telephone Encounter (Signed)
[name] with [employer] [phone] ( pt's supervisor )  called for information on pt - has been on light duty since 11/11/13 and was wondering how much longer she will be on light duty. Suggest to fax paperwork to clinic to be addressed. Hilda Blades Ditzler RN 12/23/13 2:40PM

## 2014-01-02 ENCOUNTER — Ambulatory Visit: Payer: No Typology Code available for payment source | Admitting: Family Medicine

## 2014-01-08 ENCOUNTER — Encounter: Payer: Self-pay | Admitting: Internal Medicine

## 2014-01-08 ENCOUNTER — Ambulatory Visit (INDEPENDENT_AMBULATORY_CARE_PROVIDER_SITE_OTHER): Payer: No Typology Code available for payment source | Admitting: Internal Medicine

## 2014-01-08 VITALS — BP 153/101 | HR 80 | Temp 97.3°F | Wt 148.1 lb

## 2014-01-08 DIAGNOSIS — M5442 Lumbago with sciatica, left side: Secondary | ICD-10-CM

## 2014-01-08 DIAGNOSIS — M25519 Pain in unspecified shoulder: Secondary | ICD-10-CM

## 2014-01-08 DIAGNOSIS — M25512 Pain in left shoulder: Secondary | ICD-10-CM

## 2014-01-08 DIAGNOSIS — M545 Low back pain, unspecified: Secondary | ICD-10-CM

## 2014-01-08 DIAGNOSIS — Z0000 Encounter for general adult medical examination without abnormal findings: Secondary | ICD-10-CM

## 2014-01-08 DIAGNOSIS — I10 Essential (primary) hypertension: Secondary | ICD-10-CM

## 2014-01-08 MED ORDER — OXYCODONE-ACETAMINOPHEN 5-325 MG PO TABS: 1.0000 | ORAL_TABLET | ORAL | Status: DC | PRN

## 2014-01-08 MED ORDER — LISINOPRIL 20 MG PO TABS: 20.0000 mg | ORAL_TABLET | Freq: Every day | ORAL | Status: DC

## 2014-01-08 NOTE — Patient Instructions (Signed)
1. Try the percocet - one every 4 hours as needed. Will make you tired so do not take before work or before driving. 2. Take the Lisinopril once a day or your blood pressure 3. See me in 2 weeks

## 2014-01-09 ENCOUNTER — Encounter: Payer: Self-pay | Admitting: Internal Medicine

## 2014-01-09 NOTE — Progress Notes (Signed)
   Subjective:    Patient ID: [name], female    DOB: [birth year], 53 y.o.   MRN: [id number]  Back Pain    Please see the A&P for the status of the pt's chronic medical problems.   Review of Systems  Constitutional: Positive for unexpected weight change.  Musculoskeletal: Positive for back pain and gait problem.       Objective:   Physical Exam  Constitutional: She is oriented to person, place, and time. She appears well-developed and well-nourished. No distress.  HENT:  Head: Normocephalic and atraumatic.  Right Ear: External ear normal.  Left Ear: External ear normal.  Nose: Nose normal.  Eyes: Conjunctivae and EOM are normal.  Musculoskeletal:  No tenderness to palp of the back. Nl gait. Hip flexion only about 15 degrees before pain. Decreased twist. Negative L straight leg raise. Strength limited by pain but 5/5 for all L lower ext muscle groups.   Neurological: She is alert and oriented to person, place, and time.  Skin: Skin is warm and dry. She is not diaphoretic.  Psychiatric: She has a normal mood and affect. Her behavior is normal. Thought content normal.          Assessment & Plan:

## 2014-01-09 NOTE — Assessment & Plan Note (Signed)
Now with almost 8 weeks B lumbar midline back pain that has not improved with time, light work duty, and conservative tx. Pain now worse and radiating down L leg to great toe. No weakness, bowel or bladder dysfxn. Flexeril didn't help and tramadol didn't help either (last dose about 4 days ago). Using Aleve but taking 3 BID. Pt did not keep her 3 sports med appt due to insurance issues but now straightened out. She has no red flag sxs to warrant immediate referral, imaging, intervention. Since she is able to get into sports med one week and also has shoulder pain, I will hold off on MRI and have her see Sports Med. I suspect she might have a herniated disk and will need MRI and possible steroid injection if the imaging confirms this dx.   A : LBP with L radiation. Suspected herniated disk P : sports med      Work note      Oxycodone      Verbal instructions to return for weakness, incontinence

## 2014-01-09 NOTE — Assessment & Plan Note (Signed)
Now that she has insurance, she wants GI referral for colon.

## 2014-01-09 NOTE — Assessment & Plan Note (Signed)
BP elevated past two visit and I attributed it to pain but pt now states has not been taking Lisinopril bc she thought she didn't need it. Will resume Lisinopril - despite ACE monotherapy, her BP was well controlled. Will need BMP 2-4 weeks.

## 2014-01-09 NOTE — Assessment & Plan Note (Signed)
Still has pain but she didn't otherwise talk about it as her pain is now mostly back.

## 2014-01-14 ENCOUNTER — Ambulatory Visit (INDEPENDENT_AMBULATORY_CARE_PROVIDER_SITE_OTHER): Payer: No Typology Code available for payment source | Admitting: Sports Medicine

## 2014-01-14 ENCOUNTER — Encounter: Payer: Self-pay | Admitting: Sports Medicine

## 2014-01-14 VITALS — BP 179/118 | HR 70 | Ht 59.0 in | Wt 148.0 lb

## 2014-01-14 DIAGNOSIS — I10 Essential (primary) hypertension: Secondary | ICD-10-CM

## 2014-01-14 DIAGNOSIS — M25512 Pain in left shoulder: Secondary | ICD-10-CM

## 2014-01-14 DIAGNOSIS — M5442 Lumbago with sciatica, left side: Secondary | ICD-10-CM

## 2014-01-14 DIAGNOSIS — M25519 Pain in unspecified shoulder: Secondary | ICD-10-CM

## 2014-01-14 DIAGNOSIS — M5430 Sciatica, unspecified side: Secondary | ICD-10-CM

## 2014-01-14 MED ORDER — GABAPENTIN 100 MG PO CAPS: 100.0000 mg | ORAL_CAPSULE | Freq: Three times a day (TID) | ORAL | Status: DC

## 2014-01-14 MED ORDER — METHYLPREDNISOLONE ACETATE 40 MG/ML IJ SUSP
40.0000 mg | Freq: Once | INTRAMUSCULAR | Status: AC
  Administered 2014-01-14: 40 mg via INTRA_ARTICULAR
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
## 2014-01-14 NOTE — Assessment & Plan Note (Addendum)
Acute on Chronic condition  - Likely Supraspinatus. No impingement.  X-rays reviewed 1. Injection today. 2. Start Physical Therapy referral placed > If not improved a followup consider msk ultrasound with consideration for nitroglycerin protocol.  PROCEDURE NOTE : Left shoulder Injection The risks, benefits and expected outcomes of the injection were reviewed and she wishes to undergo the above named procedure.  Written consent was obtained. After an appropriate time out was taken the left shoulder was prepped in a clean fashion and injected as below: Approach:  Posterior Needle:  22-gauge 1/2 inch Meds:   3 cc of 1% lidocaine with 1 cc of 40 mg Depo-Medrol A bandaid was applied to the area. This procedure was well tolerated and there were no complications.

## 2014-01-14 NOTE — Assessment & Plan Note (Addendum)
Subacute condition  - Start with X-rays.  Likely some neuritis but overall seems improved compared to prior symptoms. No red flags 1. Start neurontin 2. Back school PT > X-rays will likely reveal degenerative changes. Given she is overall improving I suspect she will do well with physical therapy and Neurontin for a short course. She is able to return to work but should continue to limit lifting to 15-20 pounds and minimize bending stooping twisting on a repetitive basis.      > If persistent symptoms at next visit or any worsening signs or symptoms patient will need MRI

## 2014-01-14 NOTE — Patient Instructions (Signed)
Go get X-rays of your Back. Start Neurontin 100mg  each night.  Increase to three times per day over the next 2 weeks.

## 2014-01-20 ENCOUNTER — Ambulatory Visit: Payer: No Typology Code available for payment source

## 2014-01-22 ENCOUNTER — Encounter: Payer: Self-pay | Admitting: Internal Medicine

## 2014-01-22 ENCOUNTER — Ambulatory Visit (INDEPENDENT_AMBULATORY_CARE_PROVIDER_SITE_OTHER): Payer: No Typology Code available for payment source | Admitting: Internal Medicine

## 2014-01-22 VITALS — BP 144/97 | HR 79 | Temp 98.0°F | Wt 149.7 lb

## 2014-01-22 DIAGNOSIS — Z0000 Encounter for general adult medical examination without abnormal findings: Secondary | ICD-10-CM

## 2014-01-22 DIAGNOSIS — M5442 Lumbago with sciatica, left side: Secondary | ICD-10-CM

## 2014-01-22 DIAGNOSIS — M5430 Sciatica, unspecified side: Secondary | ICD-10-CM

## 2014-01-22 DIAGNOSIS — M25512 Pain in left shoulder: Secondary | ICD-10-CM

## 2014-01-22 DIAGNOSIS — I10 Essential (primary) hypertension: Secondary | ICD-10-CM

## 2014-01-22 DIAGNOSIS — M25519 Pain in unspecified shoulder: Secondary | ICD-10-CM

## 2014-01-22 LAB — BASIC METABOLIC PANEL WITH GFR
BUN: 16 mg/dL (ref 6–23)
CHLORIDE: 106 meq/L (ref 96–112)
CO2: 24 meq/L (ref 19–32)
Calcium: 9.1 mg/dL (ref 8.4–10.5)
Creat: 0.68 mg/dL (ref 0.50–1.10)
GFR, Est African American: >89 mL/min
GFR, Est Non African American: >89 mL/min
GLUCOSE: 88 mg/dL (ref 70–99)
POTASSIUM: 4 meq/L (ref 3.5–5.3)
Sodium: 140 mEq/L (ref 135–145)

## 2014-01-22 NOTE — Assessment & Plan Note (Addendum)
She just resumed her lisinopril 20 a couple of weeks ago. She cont to be in pain, although it is decreasing. BP had been controlled on lisinopril 20 in recent past. Therefore, I am leaving med and dose as is. She will get it checked at Dr Rigby's office and at PT. She is going to try to get BP cuff to check at home. Recheck here 2-3 months. Will escalate med / dose if still > 140 / 90. Check BMP today.

## 2014-01-22 NOTE — Patient Instructions (Signed)
1. Get your back X ray 2. Go see PT 3. I gave you a work note - let me know if it doesn't work 4. Cont the new med for your back - Gabapentin three times daily 5. May take the percocet at night for severe pain 6. Blood pressure goal is less than 140 / 90. Call me if always higher. 7. See me in 2-3 months

## 2014-01-22 NOTE — Assessment & Plan Note (Signed)
She is ready to get her colonoscopy so order entered. Does have anemia and is on Fe SO4.   C/O stress incontinence - only when goes to stand up. Only going on for two weeks. No other uro signs / sxs. No gyn sxs. Had wo C sections. She got OTC UTI strips and they were negative for infxn. Forgot to give her [place] exercises so will mail.

## 2014-01-22 NOTE — Assessment & Plan Note (Signed)
Dr Paulla Fore gave steroid injection and L shoulder is somewhat better - less pain and improved ROM. To start PT.

## 2014-01-22 NOTE — Progress Notes (Signed)
   Subjective:    Patient ID: [name], female    DOB: Oct 30, 1960, 53 y.o.   MRN: [id number]  Back Pain Pertinent negatives include no dysuria.    Please see the A&P for the status of the pt's chronic medical problems.   Review of Systems  Constitutional: Negative for activity change, appetite change and unexpected weight change.  Genitourinary: Negative for dysuria, frequency, hematuria, vaginal discharge and difficulty urinating.  Musculoskeletal: Positive for back pain and gait problem.       Objective:   Physical Exam  Constitutional: She is oriented to person, place, and time. She appears well-developed and well-nourished. No distress.  HENT:  Head: Normocephalic and atraumatic.  Right Ear: External ear normal.  Left Ear: External ear normal.  Nose: Nose normal.  Neurological: She is alert and oriented to person, place, and time.  Skin: Skin is warm and dry. She is not diaphoretic.  Psychiatric: She has a normal mood and affect. Her behavior is normal. Judgment and thought content normal.          Assessment & Plan:

## 2014-01-23 ENCOUNTER — Encounter: Payer: Self-pay | Admitting: Internal Medicine

## 2014-01-26 ENCOUNTER — Telehealth: Payer: Self-pay | Admitting: *Deleted

## 2014-01-26 ENCOUNTER — Ambulatory Visit: Payer: No Typology Code available for payment source | Admitting: Physical Therapy

## 2014-01-26 ENCOUNTER — Ambulatory Visit: Payer: No Typology Code available for payment source | Attending: Internal Medicine | Admitting: Physical Therapy

## 2014-01-26 DIAGNOSIS — M25519 Pain in unspecified shoulder: Secondary | ICD-10-CM | POA: Diagnosis not present

## 2014-01-26 DIAGNOSIS — M545 Low back pain, unspecified: Secondary | ICD-10-CM | POA: Diagnosis not present

## 2014-01-26 DIAGNOSIS — IMO0001 Reserved for inherently not codable concepts without codable children: Secondary | ICD-10-CM | POA: Insufficient documentation

## 2014-01-26 NOTE — Telephone Encounter (Signed)
Pt called in with c/o elevated BP and foot pain. Returned call to pt and no answer.  Left message for her to call when she gets in.

## 2014-01-27 NOTE — Telephone Encounter (Signed)
Pt called again today.  No answer.  Message left to return call to clinic so I can schedule an appointment.

## 2014-01-28 ENCOUNTER — Other Ambulatory Visit: Payer: Self-pay | Admitting: *Deleted

## 2014-01-28 MED ORDER — OXYCODONE-ACETAMINOPHEN 5-325 MG PO TABS: 1.0000 | ORAL_TABLET | Freq: Every evening | ORAL | Status: DC | PRN

## 2014-01-28 NOTE — Telephone Encounter (Signed)
Pt aware Rx is ready. 

## 2014-02-03 ENCOUNTER — Encounter: Payer: No Typology Code available for payment source | Admitting: Physical Therapy

## 2014-02-05 ENCOUNTER — Encounter: Payer: No Typology Code available for payment source | Admitting: Physical Therapy

## 2014-02-05 ENCOUNTER — Encounter: Payer: Self-pay | Admitting: Internal Medicine

## 2014-02-05 NOTE — Telephone Encounter (Signed)
Spoke with pt today and she is seeing Sports Med for treatment of her foot. BP is 120/70 today. No other complaints.

## 2014-02-10 ENCOUNTER — Encounter: Payer: No Typology Code available for payment source | Admitting: Physical Therapy

## 2014-02-11 ENCOUNTER — Ambulatory Visit (INDEPENDENT_AMBULATORY_CARE_PROVIDER_SITE_OTHER): Payer: No Typology Code available for payment source | Admitting: Sports Medicine

## 2014-02-11 ENCOUNTER — Encounter: Payer: Self-pay | Admitting: Sports Medicine

## 2014-02-11 VITALS — BP 154/95 | Ht 59.0 in | Wt 148.0 lb

## 2014-02-11 DIAGNOSIS — M25519 Pain in unspecified shoulder: Secondary | ICD-10-CM

## 2014-02-11 DIAGNOSIS — M5430 Sciatica, unspecified side: Secondary | ICD-10-CM

## 2014-02-11 DIAGNOSIS — M5412 Radiculopathy, cervical region: Secondary | ICD-10-CM

## 2014-02-11 DIAGNOSIS — M5442 Lumbago with sciatica, left side: Secondary | ICD-10-CM

## 2014-02-11 DIAGNOSIS — M25512 Pain in left shoulder: Secondary | ICD-10-CM

## 2014-02-11 MED ORDER — GABAPENTIN 300 MG PO CAPS: 300.0000 mg | ORAL_CAPSULE | Freq: Three times a day (TID) | ORAL | Status: DC

## 2014-02-11 NOTE — Patient Instructions (Signed)
Do the exercises we discussed including 1pound arm lifts.  Stretch your neck side to sidethroughout the day.    I have increased your gabapentin to 300mg  three times per day.  Get X-rays of your neck and your back  Follow up with PT when you are able.  I have gotten you a letter with the work restrictions

## 2014-02-11 NOTE — Progress Notes (Signed)
  [name] - 53 y.o. female MRN [id number]  Date of birth: 08-11-1960  SUBJECTIVE:  Including CC & ROS.  The patient following up for: Left Shoulder Pain: Pt reports overall significant improvement in lateral shoulder pain following injection.  However, she is now experiencing worsening left arm numbness, tingling, and easy fatigability.  She went to PT twice but due to financial/insurance limitations has not been back and is not performing any therapeutic exercises at this time.  Diffuse hand pain but seem to involve the ulnar aspect more than radial.  Back/Leg Pain: No worsening in character but having persistent sx.  Still left sided leg pain radiating to foot.  No new foot pain/injury.  X-rays were defered last visit until starting PT; given persistent sx and not in PT PCP ordered but pt has not had films completed.  No changes in bowel or bladder but once again persistent issues with urinary incontinence but this is unchanged.  Worsening symptoms with activity including work   PHYSICAL EXAM:  VS: BP:154/95 mmHg  HR: bpm  TEMP: ( )  RESP:   HT:4\' 11"  (149.9 cm)   WT:148 lb (67.132 kg)  BMI:30 PHYSICAL EXAM: GENERAL:  Adult African American  female. In no discomfort; no respiratory distress  PSYCH:  alert and appropriate, good insight  UE Exam:        Gen/Palp:  No shoulder TTP       ROM/MST:  Shoulder with improved active ROM but still discordinate , internal rotation to t8 on left; t5 on Right.  supraspinatus and external rotation 4/5 strength. negative drop arm                     NV:  Strength in 5+/5 in UE myotomes; sensation is diminished in all of left arm without specific dermatomal pattern. UE DTRs 2/4 diffusely                 Tests:  + spurlings; + brachial plexus ttp BACK Exam:        Gen/Palp:  Normal appearing. No midline tenderness       ROM/MST:  Limited hamstring flexibility to 30 degrees bilateral                      NV:  LE strength 5/5. Sensation dimished  diffusedly on left without dermatomal distribution LE DTRs 2/4 diffusely                 Tests:  + straight leg raise on Right  ASSESSMENT & PLAN: See problem based charting & AVS for pt instructions.

## 2014-02-12 ENCOUNTER — Encounter: Payer: No Typology Code available for payment source | Admitting: Physical Therapy

## 2014-02-12 DIAGNOSIS — M5412 Radiculopathy, cervical region: Secondary | ICD-10-CM | POA: Insufficient documentation

## 2014-02-12 NOTE — Assessment & Plan Note (Signed)
Pain resolved following CSI.  Now worsening radicular sx.  Still some rotator cuff weakness. Exercises given.  Would benefit from PT but not currently going.  >Ensure continued improvement at follow up.

## 2014-02-12 NOTE — Assessment & Plan Note (Signed)
Persistent sx.  No red flags.  Pt self d/c PT due to insurance issues.   Letter provided for work restrictions. Pt to get X-rays performed - will call with results Previously only taking 100mg  of gabapentin without relief; never on 300.  Titrate to 300. Pt to return to PT when able.  Otherwise therapeutic exercises reviewed with pt today

## 2014-02-12 NOTE — Assessment & Plan Note (Addendum)
Now that shoulder is better and increasing activity radicular sx seems to have worsened. Will increase Gabapentin to 300 mg.  Pt given therapeutic exercises for shoulder and neck including gentle ROM stretching for cervical spine. X-rays ordered.  Expect to see diffuse degenerative changes but sx seem most consistent around C6/C7 given ulnar distribution of symptoms Follow up in 4 weeks.  Will call with results of X-rays.

## 2014-03-11 ENCOUNTER — Ambulatory Visit: Payer: No Typology Code available for payment source | Admitting: Sports Medicine

## 2014-03-16 ENCOUNTER — Other Ambulatory Visit: Payer: Self-pay | Admitting: *Deleted

## 2014-03-16 NOTE — Telephone Encounter (Signed)
Pt has appt at sports medicine wed, she would like to pick up script then

## 2014-03-17 MED ORDER — OXYCODONE-ACETAMINOPHEN 5-325 MG PO TABS: 1.0000 | ORAL_TABLET | Freq: Every evening | ORAL | Status: DC | PRN

## 2014-03-17 NOTE — Telephone Encounter (Signed)
Pt informed Rx is ready 

## 2014-03-17 NOTE — Telephone Encounter (Signed)
She got 30 01/28/14 from me. HAs F/U me and sports med. Will fill and have her F/U

## 2014-03-18 ENCOUNTER — Encounter: Payer: Self-pay | Admitting: Sports Medicine

## 2014-03-18 ENCOUNTER — Ambulatory Visit (INDEPENDENT_AMBULATORY_CARE_PROVIDER_SITE_OTHER): Payer: No Typology Code available for payment source | Admitting: Sports Medicine

## 2014-03-18 VITALS — BP 136/90 | HR 83 | Ht 59.0 in | Wt 148.0 lb

## 2014-03-18 DIAGNOSIS — M5412 Radiculopathy, cervical region: Secondary | ICD-10-CM

## 2014-03-18 MED ORDER — PREDNISONE 50 MG PO TABS: ORAL_TABLET | ORAL | Status: DC

## 2014-03-18 NOTE — Progress Notes (Signed)
  [name] - 53 y.o. female MRN [id number]  Date of birth: Sep 13, 1960  SUBJECTIVE:  Including CC & ROS.  The patient is here to follow up: Left shoulder and arm pain: Patient reports not being able to obtain x-rays due to loss of insurance. She does continue to have persistent significant left-sided arm numbness and tingling in the fourth and fifth fingers. She does not report any significant weakness but her symptoms are intermittently worse with increased activity. She denies any changes in her gait, changes in bowel or bladder, fevers, chills or night sweats. She does have some improvement with gabapentin and denies any significant side effects to this medicine.   OBJECTIVE FINDINGS:  VS:  HT:4\' 11"  (149.9 cm)   WT:148 lb (67.132 kg)  BMI:30          BP:136/90 mmHg  HR:83bpm  TEMP: ( )  RESP:   PHYSICAL EXAM:            GENERAL:   ADULT [place] AMER[place] female. In no discomfort; no respiratory distress                PSYCH:  alert and appropriate, good insight   left upper extremity Exam:   APPEAR/PALP:   overall normal-appearing. No significant atrophy. Mild brachioplexus tenderness over the left                      ROM:  Limited cervical side bending to 40 bilaterally. Normal flexion extension.        STRENGTH:  Upper extremity strength is 5 out of 5 in upper extremity myotomes.                   NV:  Slight dysesthesia in C7- C8 distribution. Reflexes 1+ out of 4 diffusely in bilateral upper extremities.              Tests:   slightly positive Spurling's compression  ASSESSMENT: 1. Cervical radiculitis     likely from degenerative changes but no objective evidence   PLAN: See problem based charting & AVS for additional documentation. - Encouraged patient to obtain x-rays and she is able as this will help guide [place] further treatment. She will likely need an MRI at some point if not improved with conservative therapy. - Five-day prednisone burst as she has not tried  this. - Okay to titrate gabapentin to 1200 mg daily.   > Return in about 6 weeks (around 04/29/2014).

## 2014-03-18 NOTE — Patient Instructions (Signed)

## 2014-03-26 ENCOUNTER — Ambulatory Visit: Payer: No Typology Code available for payment source | Admitting: Internal Medicine

## 2014-03-27 ENCOUNTER — Ambulatory Visit (HOSPITAL_COMMUNITY)
Admission: RE | Admit: 2014-03-27 | Discharge: 2014-03-27 | Disposition: A | Discharge status: Home / Self Care | Payer: No Typology Code available for payment source | Source: Ambulatory Visit | Attending: Internal Medicine | Admitting: Internal Medicine

## 2014-03-27 ENCOUNTER — Other Ambulatory Visit: Payer: Self-pay | Admitting: *Deleted

## 2014-03-27 ENCOUNTER — Other Ambulatory Visit: Payer: Self-pay | Admitting: Sports Medicine

## 2014-03-27 ENCOUNTER — Ambulatory Visit (AMBULATORY_SURGERY_CENTER): Payer: Self-pay | Admitting: *Deleted

## 2014-03-27 VITALS — Ht 59.0 in | Wt 153.0 lb

## 2014-03-27 DIAGNOSIS — M5410 Radiculopathy, site unspecified: Secondary | ICD-10-CM | POA: Diagnosis present

## 2014-03-27 DIAGNOSIS — M5412 Radiculopathy, cervical region: Secondary | ICD-10-CM

## 2014-03-27 DIAGNOSIS — M5442 Lumbago with sciatica, left side: Secondary | ICD-10-CM

## 2014-03-27 DIAGNOSIS — Z1211 Encounter for screening for malignant neoplasm of colon: Secondary | ICD-10-CM

## 2014-03-27 MED ORDER — MOVIPREP 100 G PO SOLR: 1.0000 | Freq: Once | ORAL | Status: DC

## 2014-03-27 NOTE — Progress Notes (Signed)
No egg or soy allergy. ewm No home 02 or cpap use. ewm No diet pills. ewm No problems with past sedation. ewm emmi video declined. ewm

## 2014-03-30 ENCOUNTER — Telehealth: Payer: Self-pay | Admitting: Sports Medicine

## 2014-03-30 NOTE — Telephone Encounter (Signed)
We have her set up for a cervical spine MRI.  Once we have this back we can figure out where to go from there.  If worsening significantly and/or loosing function will need to be seen sooner.  Please call and inform her of the findings and plan as above

## 2014-03-31 ENCOUNTER — Other Ambulatory Visit: Payer: Self-pay | Admitting: *Deleted

## 2014-03-31 MED ORDER — DIAZEPAM 5 MG PO TABS: ORAL_TABLET | ORAL | Status: DC

## 2014-04-06 ENCOUNTER — Telehealth: Payer: Self-pay | Admitting: Internal Medicine

## 2014-04-06 NOTE — Telephone Encounter (Signed)
Pt will come by [facility] 4th floor Tuesday 10/13 to pick up instructions for split dose Miralax prep

## 2014-04-07 ENCOUNTER — Ambulatory Visit
Admission: RE | Admit: 2014-04-07 | Discharge: 2014-04-07 | Disposition: A | Discharge status: Home / Self Care | Payer: No Typology Code available for payment source | Source: Ambulatory Visit | Attending: Sports Medicine | Admitting: Sports Medicine

## 2014-04-07 DIAGNOSIS — M5412 Radiculopathy, cervical region: Secondary | ICD-10-CM

## 2014-04-07 NOTE — Telephone Encounter (Signed)
Reviewed split dose Miralax instructions with patient

## 2014-04-10 ENCOUNTER — Ambulatory Visit (AMBULATORY_SURGERY_CENTER): Payer: No Typology Code available for payment source | Admitting: Internal Medicine

## 2014-04-10 ENCOUNTER — Encounter: Payer: Self-pay | Admitting: Internal Medicine

## 2014-04-10 ENCOUNTER — Telehealth: Payer: Self-pay | Admitting: Sports Medicine

## 2014-04-10 VITALS — BP 155/97 | HR 76 | Temp 97.7°F | Resp 18 | Ht 59.0 in | Wt 153.0 lb

## 2014-04-10 DIAGNOSIS — M5412 Radiculopathy, cervical region: Secondary | ICD-10-CM

## 2014-04-10 DIAGNOSIS — Z1211 Encounter for screening for malignant neoplasm of colon: Secondary | ICD-10-CM

## 2014-04-10 MED ORDER — SODIUM CHLORIDE 0.9 % IV SOLN: 500.0000 mL | INTRAVENOUS | Status: DC

## 2014-04-10 NOTE — Telephone Encounter (Signed)
Called pt with results from MRI and discussed options.  Will refer to Dr. Ernestina Patches at [facility] for C6-C7 Left Trans foraminal Epidural. Will plan to have her follow up with me at [facility] 4-6 weeks after injection.

## 2014-04-10 NOTE — Progress Notes (Signed)
Report to PACU, RN, vss, BBS= Clear.  

## 2014-04-10 NOTE — Op Note (Signed)
[place]  [address] [place], [address]   COLONOSCOPY PROCEDURE REPORT  PATIENT: [name], [name]  MR#: [id number] BIRTHDATE: Apr 25, 1961 , 89  yrs. old GENDER: female ENDOSCOPIST: Eustace Quail, MD REFERRED [id number] Lynnae January, M.D. PROCEDURE DATE:  04/10/2014 PROCEDURE:   Colonoscopy, screening First Screening Colonoscopy - Avg.  risk and is 50 yrs.  old or older Yes.  Prior Negative Screening - Now for repeat screening. N/A  History of Adenoma - Now for follow-up colonoscopy & has been > or = to 3 yrs.  N/A  Polyps Removed Today? No.  Recommend repeat exam, <10 yrs? No. ASA CLASS:   Class II INDICATIONS:average risk for colorectal cancer. MEDICATIONS: Monitored anesthesia care and Propofol 280 mg IV  DESCRIPTION OF PROCEDURE:   After the risks benefits and alternatives of the procedure were thoroughly explained, informed consent was obtained.  The digital rectal exam revealed no abnormalities of the rectum.   The [id number]  endoscope was introduced through the anus and advanced to the cecum, which was identified by both the appendix and ileocecal valve. No adverse events experienced.   The quality of the prep was excellent, using MoviPrep  The instrument was then slowly withdrawn as the colon was fully examined.      COLON FINDINGS: A normal appearing cecum, ileocecal valve, and appendiceal orifice were identified.  The ascending, transverse, descending, sigmoid colon, and rectum appeared unremarkable. Retroflexed views revealed internal hemorrhoids and hypertrophic anal papilla. The time to cecum=2 minutes 07 seconds.  Withdrawal time=8 minutes 33 seconds.  The scope was withdrawn and the procedure completed. COMPLICATIONS: There were no immediate complications.  ENDOSCOPIC IMPRESSION: 1. Normal colonoscopy  RECOMMENDATIONS: 1. Continue current colorectal screening recommendations for "routine risk" patients with a repeat  colonoscopy in 10 years.  eSigned:  Eustace Quail, MD 04/10/2014 10:55 AM   cc: Larey Dresser MD    ; The Patient

## 2014-04-10 NOTE — Patient Instructions (Signed)
YOU HAD AN ENDOSCOPIC PROCEDURE TODAY AT THE [facility]: Refer to the procedure report that was given to you for any specific questions about what was found during the examination.  If the procedure report does not answer your questions, please call your gastroenterologist to clarify.  If you requested that your care partner not be given the details of your procedure findings, then the procedure report has been included in a sealed envelope for you to review at your convenience later.  YOU SHOULD EXPECT: Some feelings of bloating in the abdomen. Passage of more gas than usual.  Walking can help get rid of the air that was put into your GI tract during the procedure and reduce the bloating. If you had a lower endoscopy (such as a colonoscopy or flexible sigmoidoscopy) you may notice spotting of blood in your stool or on the toilet paper. If you underwent a bowel prep for your procedure, then you may not have a normal bowel movement for a few days.  DIET: Your first meal following the procedure should be a light meal and then it is ok to progress to your normal diet.  A half-sandwich or bowl of soup is an example of a good first meal.  Heavy or fried foods are harder to digest and may make you feel nauseous or bloated.  Likewise meals heavy in dairy and vegetables can cause extra gas to form and this can also increase the bloating.  Drink plenty of fluids but you should avoid alcoholic beverages for 24 hours.  ACTIVITY: Your care partner should take you home directly after the procedure.  You should plan to take it easy, moving slowly for the rest of the day.  You can resume normal activity the day after the procedure however you should NOT DRIVE or use heavy machinery for 24 hours (because of the sedation medicines used during the test).    SYMPTOMS TO REPORT IMMEDIATELY: A gastroenterologist can be reached at any hour.  During normal business hours, 8:30 AM to 5:00 PM Monday through Friday,  call [phone].  After hours and on weekends, please call the GI answering service at [phone] who will take a message and have the physician on call contact you.   Following lower endoscopy (colonoscopy or flexible sigmoidoscopy):  Excessive amounts of blood in the stool  Significant tenderness or worsening of abdominal pains  Swelling of the abdomen that is new, acute  Fever of 100F or higher    FOLLOW UP: If any biopsies were taken you will be contacted by phone or by letter within the next 1-3 weeks.  Call your gastroenterologist if you have not heard about the biopsies in 3 weeks.  Our staff will call the home number listed on your records the next business day following your procedure to check on you and address any questions or concerns that you may have at that time regarding the information given to you following your procedure. This is a courtesy call and so if there is no answer at the home number and we have not heard from you through the emergency physician on call, we will assume that you have returned to your regular daily activities without incident.  SIGNATURES/CONFIDENTIALITY: You and/or your care partner have signed paperwork which will be entered into your electronic medical record.  These signatures attest to the fact that that the information above on your After Visit Summary has been reviewed and is understood.  Full responsibility of the confidentiality   of this discharge information lies with you and/or your care-partner.     

## 2014-04-13 ENCOUNTER — Telehealth: Payer: Self-pay

## 2014-04-13 NOTE — Telephone Encounter (Signed)
  Follow up Call-  Call back number 04/10/2014  Post procedure Call Back phone  # [phone]  Permission to leave phone message Yes     Patient questions:  Do you have a fever, pain , or abdominal swelling? No. Pain Score  0 *  Have you tolerated food without any problems? Yes.    Have you been able to return to your normal activities? Yes.    Do you have any questions about your discharge instructions: Diet   No. Medications  No. Follow up visit  No.  Do you have questions or concerns about your Care? No.  Actions: * If pain score is 4 or above: No action needed, pain <4.

## 2014-04-14 ENCOUNTER — Encounter: Payer: Self-pay | Admitting: Internal Medicine

## 2014-04-15 ENCOUNTER — Other Ambulatory Visit: Payer: Self-pay | Admitting: *Deleted

## 2014-04-15 ENCOUNTER — Ambulatory Visit (INDEPENDENT_AMBULATORY_CARE_PROVIDER_SITE_OTHER): Payer: No Typology Code available for payment source | Admitting: Internal Medicine

## 2014-04-15 ENCOUNTER — Encounter: Payer: Self-pay | Admitting: Internal Medicine

## 2014-04-15 VITALS — BP 125/76 | HR 81 | Temp 98.5°F | Wt 150.1 lb

## 2014-04-15 DIAGNOSIS — M79671 Pain in right foot: Secondary | ICD-10-CM

## 2014-04-15 DIAGNOSIS — Z0000 Encounter for general adult medical examination without abnormal findings: Secondary | ICD-10-CM

## 2014-04-15 DIAGNOSIS — F17200 Nicotine dependence, unspecified, uncomplicated: Secondary | ICD-10-CM

## 2014-04-15 DIAGNOSIS — Z720 Tobacco use: Secondary | ICD-10-CM

## 2014-04-15 DIAGNOSIS — Z23 Encounter for immunization: Secondary | ICD-10-CM

## 2014-04-15 DIAGNOSIS — I10 Essential (primary) hypertension: Secondary | ICD-10-CM

## 2014-04-15 DIAGNOSIS — M722 Plantar fascial fibromatosis: Secondary | ICD-10-CM | POA: Insufficient documentation

## 2014-04-15 NOTE — Assessment & Plan Note (Addendum)
Pt comes in clinic today complains of worsening right heel pain.  She has experienced the pain before but it usually goes away.  She states it started about four days ago.  Describes the pain as sharp and gets worse throughout the day, especially at night when she is reclining.  No h/o DM, gout, or trauma to the area.  Denies wearing any new shoes.  Possible plantar fasciitis but usually this gets better with activity which is not c/w her symptoms of getting worse throughout the day.  Also could be arthritis.  No h/o DM so peripheral neuropathy is unlikely and is only in the right heel.  On exam, I do not appreciate any heel swelling or erythema, warmth, or tenderness.  The right heel is tender to palpation but there is no other significant findings. Denies any fever/chills, N/V or other systemic s/s.  -XR of R foot to rule out any acute fracture -symptomatic tx with stretching exercises, naproxen PRN, and ice -continue gabapentin -encouraged her to call and follow up with Dr. Paulla Fore (she is followed by [facility])

## 2014-04-15 NOTE — Assessment & Plan Note (Signed)
-  influenza vaccine given today

## 2014-04-15 NOTE — Assessment & Plan Note (Signed)
BP today 125/76.   -continue lisinopril 20mg  daily

## 2014-04-15 NOTE — Patient Instructions (Signed)
Thank you for your visit today.   Please return to the internal medicine clinic in as needed.     We will x-ray your right foot today. You may take naproxen for pain (aleve) as needed. You may also try ice to relieve the pain. Stretching exercises may also help.  I encourage you to quit smoking!!    Your current medical regimen is effective;  continue present plan and take all medications as prescribed.    Please be sure to bring all of your medications with you to every visit; this includes herbal supplements, vitamins, eye drops, and any over-the-counter medications.   Should you have any questions regarding your medications and/or any new or worsening symptoms, please be sure to call the clinic at [phone].   If you believe that you are suffering from a life threatening condition or one that may result in the loss of limb or function, then you should call 911 or proceed to the nearest Emergency Department.     A healthy lifestyle and preventative care can promote health and wellness.   Maintain regular health, dental, and eye exams.  Eat a healthy diet. Foods like vegetables, fruits, whole grains, low-fat dairy products, and lean protein foods contain the nutrients you need without too many calories. Decrease your intake of foods high in solid fats, added sugars, and salt. Get information about a proper diet from your caregiver, if necessary.  Regular physical exercise is one of the most important things you can do for your health. Most adults should get at least 150 minutes of moderate-intensity exercise (any activity that increases your heart rate and causes you to sweat) each week. In addition, most adults need muscle-strengthening exercises on 2 or more days a week.   Maintain a healthy weight. The body mass index (BMI) is a screening tool to identify possible weight problems. It provides an estimate of body fat based on height and weight. Your caregiver can help determine  your BMI, and can help you achieve or maintain a healthy weight. For adults 20 years and older:  A BMI below 18.5 is considered underweight.  A BMI of 18.5 to 24.9 is normal.  A BMI of 25 to 29.9 is considered overweight.  A BMI of 30 and above is considered obese.

## 2014-04-15 NOTE — Assessment & Plan Note (Signed)
-  encouraged her to quit smoking; already knows about the quitline

## 2014-04-15 NOTE — Progress Notes (Signed)
Case discussed with Dr. Gill soon after the resident saw the patient.  We reviewed the resident's history and exam and pertinent patient test results.  I agree with the assessment, diagnosis, and plan of care documented in the resident's note. 

## 2014-04-15 NOTE — Progress Notes (Signed)
Patient ID: DIASIA HENKEN, female   DOB: 1960-07-13, 53 y.o.   MRN: [id number]    Subjective:   Patient ID: JENNENE DOWNIE female    DOB: 05-Sep-1960 53 y.o.    MRN: [id number] Health Maintenance Due: Health Maintenance Due  Topic Date Due  . Influenza Vaccine  01/24/2014    _________________________________________________  HPI: Ms.Derrika P Cranford is a 53 y.o. female here for an acute visit.  Pt has a PMH outlined below.  Please see problem-based charting assessment and plan note for further details of medical issues addressed at today's visit.  PMH: Past Medical History  Diagnosis Date  . Hyperlipidemia   . Hiatal hernia   . GERD (gastroesophageal reflux disease)   . Hypertension   . Anemia   . Arthritis     Medications: Current Outpatient Prescriptions on File Prior to Visit  Medication Sig Dispense Refill  . cyclobenzaprine (FLEXERIL) 10 MG tablet Take 1 tablet (10 mg total) by mouth 3 (three) times daily as needed for muscle spasms.  30 tablet  0  . diazepam (VALIUM) 5 MG tablet Take one pill one hour before your MRI appt. Make sure you have someone drive you to the appt  2 tablet  0  . dicyclomine (BENTYL) 20 MG tablet Take 1 tablet (20 mg total) by mouth 2 (two) times daily.  20 tablet  0  . ferrous sulfate (IRON SUPPLEMENT) 325 (65 FE) MG tablet Take 325 mg by mouth 3 (three) times daily.       Marland Kitchen gabapentin (NEURONTIN) 300 MG capsule Take 1 capsule (300 mg total) by mouth 3 (three) times daily.  90 capsule  1  . lisinopril (PRINIVIL,ZESTRIL) 20 MG tablet Take 1 tablet (20 mg total) by mouth daily.  90 tablet  11  . naproxen sodium (ANAPROX) 220 MG tablet Take 220 mg by mouth 2 (two) times daily with a meal.      . omeprazole (PRILOSEC) 20 MG capsule Take 1 capsule (20 mg total) by mouth daily.  30 capsule  4   No current facility-administered medications on file prior to visit.    Allergies: Allergies  Allergen Reactions  . Aspirin Hives  . Hydromorphone  Hcl Hives and Nausea And Vomiting    FH: Family History  Problem Relation Age of Onset  . Cancer Mother 73    pancreatic cancer  . Pancreatic cancer Mother   . Cancer Father 44    throat cancer  . Cancer Maternal Aunt 60    breast cancer  . Cancer Other     Died  from Colon cancer in 41's  . Colon cancer Maternal Uncle   . Colon cancer Paternal Uncle     SH: History   Social History  . Marital Status: Divorced    Spouse Name: N/A    Number of Children: N/A  . Years of Education: N/A   Occupational History  . unemployed     used to work in a [employer]   Social History Main Topics  . Smoking status: Current Every Day Smoker -- 0.10 packs/day    Types: Cigarettes  . Smokeless tobacco: Never Used     Comment: Down to 1-2 cig/day.  . Alcohol Use: Yes     Comment: occasionally  . Drug Use: No  . Sexual Activity: None   Other Topics Concern  . None   Social History Narrative   10 th grade education.    Review of Systems: Constitutional: Negative for  fever, chills and weight loss.  Eyes: Negative for blurred vision.  Respiratory: Negative for cough and shortness of breath.  Cardiovascular: Negative for chest pain, palpitations and leg swelling.  Gastrointestinal: Negative for nausea, vomiting, abdominal pain, diarrhea, constipation and blood in stool.  Genitourinary: Negative for dysuria, urgency and frequency.  Musculoskeletal: Negative for myalgias and back pain.  Neurological: Negative for dizziness, weakness and headaches.     Objective:   Vital Signs: Filed Vitals:   04/15/14 1017  BP: 125/76  Pulse: 81  Temp: 98.5 F (36.9 C)  TempSrc: Oral  Weight: 150 lb 1.6 oz (68.085 kg)  SpO2: 100%      BP Readings from Last 3 Encounters:  04/15/14 125/76  04/10/14 155/97  03/18/14 136/90    Physical Exam: Constitutional: Vital signs reviewed.  Patient is well-developed and well-nourished in NAD and cooperative with exam.  Head: Normocephalic and  atraumatic. Eyes: PERRL, EOMI, conjunctivae nl, no scleral icterus.  Neck: Supple. Cardiovascular: RRR, no MRG. Pulmonary/Chest: normal effort, CTAB, no wheezes, rales, or rhonchi. Abdominal: Soft. NT/ND +BS. Neurological: A&O x3, cranial nerves II-XII are grossly intact, moving all extremities. Extremities: 2+DP b/l; no pitting edema. Skin: Warm, dry and intact. No rash.   Assessment & Plan:   Assessment and plan was discussed and formulated with my attending.

## 2014-04-15 NOTE — Telephone Encounter (Signed)
Pt calls and

## 2014-04-16 MED ORDER — OXYCODONE-ACETAMINOPHEN 5-325 MG PO TABS: 1.0000 | ORAL_TABLET | Freq: Every evening | ORAL | Status: DC | PRN

## 2014-04-16 NOTE — Telephone Encounter (Signed)
Using about one per day. No excessive usage. For new acute pain which is being eval by specialists. Will fill

## 2014-04-23 ENCOUNTER — Encounter: Payer: Self-pay | Admitting: Internal Medicine

## 2014-04-23 ENCOUNTER — Ambulatory Visit (INDEPENDENT_AMBULATORY_CARE_PROVIDER_SITE_OTHER): Payer: No Typology Code available for payment source | Admitting: Internal Medicine

## 2014-04-23 VITALS — BP 156/100 | HR 79 | Temp 98.2°F | Wt 154.8 lb

## 2014-04-23 DIAGNOSIS — M5412 Radiculopathy, cervical region: Secondary | ICD-10-CM

## 2014-04-23 DIAGNOSIS — F17200 Nicotine dependence, unspecified, uncomplicated: Secondary | ICD-10-CM

## 2014-04-23 DIAGNOSIS — Z0000 Encounter for general adult medical examination without abnormal findings: Secondary | ICD-10-CM

## 2014-04-23 DIAGNOSIS — I10 Essential (primary) hypertension: Secondary | ICD-10-CM

## 2014-04-23 DIAGNOSIS — Z720 Tobacco use: Secondary | ICD-10-CM

## 2014-04-23 DIAGNOSIS — M722 Plantar fascial fibromatosis: Secondary | ICD-10-CM

## 2014-04-23 DIAGNOSIS — E785 Hyperlipidemia, unspecified: Secondary | ICD-10-CM

## 2014-04-23 LAB — BASIC METABOLIC PANEL WITH GFR
BUN: 13 mg/dL (ref 6–23)
CHLORIDE: 107 meq/L (ref 96–112)
CO2: 21 mEq/L (ref 19–32)
Calcium: 9 mg/dL (ref 8.4–10.5)
Creat: 0.6 mg/dL (ref 0.50–1.10)
GFR, Est African American: >89 mL/min
Glucose, Bld: 90 mg/dL (ref 70–99)
Potassium: 3.9 mEq/L (ref 3.5–5.3)
SODIUM: 142 meq/L (ref 135–145)

## 2014-04-23 LAB — LIPID PANEL
CHOL/HDL RATIO: 4.5 ratio
CHOLESTEROL: 210 mg/dL — AB (ref 0–200)
HDL: 47 mg/dL (ref >39)
LDL Cholesterol: 125 mg/dL — ABNORMAL HIGH (ref 0–99)
TRIGLYCERIDES: 188 mg/dL — AB (ref <150)
VLDL: 38 mg/dL (ref 0–40)

## 2014-04-23 NOTE — Assessment & Plan Note (Signed)
Has insurance now but not able to work 2/2 pain issues putting restrictions on her physical abilities and her job has no light duties. A lot of financial issues and the pain is making everything worse as keeps getting pain issues. I provided another work Quarry manager. She is strong and will get through this and hopefully back to work.

## 2014-04-23 NOTE — Assessment & Plan Note (Addendum)
She got the MRI (Foraminal narrowing on the left could possibly affect the C7 nerve root) and was referred to ortho but had to move the appt and is having difficulty getting new appt. Ulis Rias called and pt to call back if no appt tomorrow by noon.  The injection by Dr Bing Plume helped increase ROM but didn't decrease pain. Prednisone didn't help.

## 2014-04-23 NOTE — Patient Instructions (Signed)
1. Try the exercises, continue ibuprofen and ice, keep the appointment with Dr Paulla Fore if the pain doesn't get better 2. 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 3. Try the inhaler for shortness of breath / wheezing 4. See me in 2-4 months to check your blood pressure. 5. I hope your pain gets better!

## 2014-04-23 NOTE — Assessment & Plan Note (Signed)
BP is up despite having added back lisinopril. But she is viably distressed today and BP good last visit. Therefore, will leave lisinopril at 20 and check BMP today.  BP Readings from Last 3 Encounters:  04/23/14 156/100  04/15/14 125/76  04/10/14 155/97

## 2014-04-23 NOTE — Assessment & Plan Note (Signed)
She is now down to 2 cig / day! She knows she needs to quit and is working on it. A lot of stressors currently.

## 2014-04-23 NOTE — Assessment & Plan Note (Signed)
She had plantar fascitis of R heel in past and it resolved without intervention. Now with similar pain of R heel for a couple weeks. It is worst in AM and becomes tolerable as day goes on. Ice and IBU haven't helped. Her sxs and exam are c/w plantar fascitis. I printed off the UTD articles and gave to [name] and reviewed the tx (cont NSAID, ice) and exercises. She has Sports med appt next week and I asked that she keep appt bc if no better, could get injection.

## 2014-04-23 NOTE — Progress Notes (Signed)
   Subjective:    Patient ID: [name], female    DOB: 24-Nov-1960, 53 y.o.   MRN: [id number]  Foot Pain Associated symptoms include arthralgias and numbness. Pertinent negatives include no chest pain or coughing.  Arm Pain  Associated symptoms include numbness. Pertinent negatives include no chest pain.    Please see the A&P for the status of the pt's chronic medical problems.  Review of Systems  Constitutional: Negative for unexpected weight change.  HENT: Negative for rhinorrhea and sneezing.   Eyes: Negative for itching.  Respiratory: Positive for shortness of breath and wheezing. Negative for cough.        + DOE  Cardiovascular: Negative for chest pain.  Musculoskeletal: Positive for arthralgias and gait problem.       R heel pain  Neurological: Positive for numbness.  Psychiatric/Behavioral: Positive for sleep disturbance. The patient is nervous/anxious.        Objective:   Physical Exam  Constitutional: She is oriented to person, place, and time. She appears well-developed and well-nourished. She appears distressed.  HENT:  Head: Normocephalic and atraumatic.  Right Ear: External ear normal.  Left Ear: External ear normal.  Nose: Nose normal.  Eyes: Conjunctivae and EOM are normal.  Cardiovascular: Normal rate, regular rhythm and normal heart sounds.   No murmur heard. Pulmonary/Chest: Effort normal and breath sounds normal. No respiratory distress. She has no wheezes.  Musculoskeletal: Normal range of motion. She exhibits no edema and no tenderness.  No tenderness to palp of R heel, plantar surface but unable to put weight onto foot. FROM. Pain max at insertion of fascia and extends a bit towards toes.  Neurological: She is alert and oriented to person, place, and time.  Skin: Skin is warm and dry. She is not diaphoretic.  Psychiatric: She has a normal mood and affect. Her behavior is normal. Judgment and thought content normal.          Assessment &  Plan:

## 2014-04-23 NOTE — Assessment & Plan Note (Addendum)
Her LDL was 130 11/14. She is not on a statin - was stopped 2/2 cost. Will check FLP today since getting blood for BMP. Goal is 130 - 160.

## 2014-04-28 ENCOUNTER — Encounter: Payer: Self-pay | Admitting: Internal Medicine

## 2014-04-29 ENCOUNTER — Ambulatory Visit: Payer: No Typology Code available for payment source | Admitting: Sports Medicine

## 2014-05-02 ENCOUNTER — Emergency Department (HOSPITAL_COMMUNITY)
Admission: EM | Admit: 2014-05-02 | Discharge: 2014-05-02 | Disposition: A | Discharge status: Home / Self Care | Payer: No Typology Code available for payment source | Attending: Emergency Medicine | Admitting: Emergency Medicine

## 2014-05-02 ENCOUNTER — Encounter (HOSPITAL_COMMUNITY): Payer: Self-pay | Admitting: *Deleted

## 2014-05-02 DIAGNOSIS — Z720 Tobacco use: Secondary | ICD-10-CM | POA: Diagnosis not present

## 2014-05-02 DIAGNOSIS — M1990 Unspecified osteoarthritis, unspecified site: Secondary | ICD-10-CM | POA: Insufficient documentation

## 2014-05-02 DIAGNOSIS — M722 Plantar fascial fibromatosis: Secondary | ICD-10-CM

## 2014-05-02 DIAGNOSIS — M25562 Pain in left knee: Secondary | ICD-10-CM

## 2014-05-02 DIAGNOSIS — Z862 Personal history of diseases of the blood and blood-forming organs and certain disorders involving the immune mechanism: Secondary | ICD-10-CM | POA: Diagnosis not present

## 2014-05-02 DIAGNOSIS — I10 Essential (primary) hypertension: Secondary | ICD-10-CM | POA: Diagnosis not present

## 2014-05-02 DIAGNOSIS — Z791 Long term (current) use of non-steroidal anti-inflammatories (NSAID): Secondary | ICD-10-CM | POA: Insufficient documentation

## 2014-05-02 DIAGNOSIS — K219 Gastro-esophageal reflux disease without esophagitis: Secondary | ICD-10-CM | POA: Insufficient documentation

## 2014-05-02 DIAGNOSIS — Z79899 Other long term (current) drug therapy: Secondary | ICD-10-CM | POA: Diagnosis not present

## 2014-05-02 DIAGNOSIS — Z8639 Personal history of other endocrine, nutritional and metabolic disease: Secondary | ICD-10-CM | POA: Insufficient documentation

## 2014-05-02 HISTORY — DX: Plantar fascial fibromatosis: M72.2

## 2014-05-02 MED ORDER — HYDROCODONE-ACETAMINOPHEN 5-325 MG PO TABS: 1.0000 | ORAL_TABLET | Freq: Four times a day (QID) | ORAL | Status: DC | PRN

## 2014-05-02 MED ORDER — OXYCODONE-ACETAMINOPHEN 5-325 MG PO TABS
1.0000 | ORAL_TABLET | Freq: Once | ORAL | Status: AC
  Administered 2014-05-02: 1 via ORAL
  Filled 2014-05-02: qty 1

## 2014-05-02 MED ORDER — IBUPROFEN 800 MG PO TABS: 800.0000 mg | ORAL_TABLET | Freq: Three times a day (TID) | ORAL | Status: DC

## 2014-05-02 NOTE — ED Provider Notes (Signed)
CSN: [id number]     Arrival date & time 05/02/14  0725 History   First MD Initiated Contact with Patient 05/02/14 [phone]     Chief Complaint  Patient presents with  . Knee Pain     (Consider location/radiation/quality/duration/timing/severity/associated sxs/prior Treatment) HPI Comments: The patient is a 53 year old female presenting to the emergency room chief complaint of left knee pain, right foot pain for several weeks.  The patient denies injury to knee or her ankles or feet. Patient reports left knee discomfort worsened with movement particularly rotation of lower extremity. Patient denies swelling. Patient also reports bilateral foot pain, worsened with movement, previously diagnosed with plantar fasciitis unrelieved with ice, stretching. Patient reports he plans to call sports medicine Monday for further evaluation of her knee and foot pain. Patient reports taking gabapentin without full resolution of symptoms.  The history is provided by the patient. No language interpreter was used.    Past Medical History  Diagnosis Date  . Hyperlipidemia   . Hiatal hernia   . GERD (gastroesophageal reflux disease)   . Hypertension   . Anemia   . Arthritis   . Plantar fasciitis    Past Surgical History  Procedure Laterality Date  . Abdominal hysterectomy  12/09    Secondary to fibroids  . Cesarean section     Family History  Problem Relation Age of Onset  . Cancer Mother 16    pancreatic cancer  . Pancreatic cancer Mother   . Cancer Father 36    throat cancer  . Cancer Maternal Aunt 60    breast cancer  . Cancer Other     Died  from Colon cancer in 57's  . Colon cancer Maternal Uncle   . Colon cancer Paternal Uncle    History  Substance Use Topics  . Smoking status: Current Every Day Smoker -- 0.10 packs/day    Types: Cigarettes  . Smokeless tobacco: Never Used     Comment: Down to 1-2 cig/day.  . Alcohol Use: Yes     Comment: occasionally   OB History    No data  available     Review of Systems  Constitutional: Negative for fever and chills.  Musculoskeletal: Positive for arthralgias.  Skin: Positive for color change and wound.  Neurological: Negative for weakness.      Allergies  Aspirin and Hydromorphone hcl  Home Medications   Prior to Admission medications   Medication Sig Start Date End Date Taking? Authorizing Provider  cyclobenzaprine (FLEXERIL) 10 MG tablet Take 1 tablet (10 mg total) by mouth 3 (three) times daily as needed for muscle spasms. 11/11/13   Bartholomew Crews, MD  diazepam (VALIUM) 5 MG tablet Take one pill one hour before your MRI appt. Make sure you have someone drive you to the appt 03/31/14   Gerda Diss, DO  dicyclomine (BENTYL) 20 MG tablet Take 1 tablet (20 mg total) by mouth 2 (two) times daily. 10/02/13   Larene Pickett, PA-C  ferrous sulfate (IRON SUPPLEMENT) 325 (65 FE) MG tablet Take 325 mg by mouth 3 (three) times daily.     Historical Provider, MD  gabapentin (NEURONTIN) 300 MG capsule Take 1 capsule (300 mg total) by mouth 3 (three) times daily. 02/11/14   Gerda Diss, DO  lisinopril (PRINIVIL,ZESTRIL) 20 MG tablet Take 1 tablet (20 mg total) by mouth daily. 01/08/14   Bartholomew Crews, MD  naproxen sodium (ANAPROX) 220 MG tablet Take 220 mg by mouth 2 (two) times  daily with a meal.    Historical Provider, MD  omeprazole (PRILOSEC) 20 MG capsule Take 1 capsule (20 mg total) by mouth daily. 05/19/13   Bartholomew Crews, MD  oxyCODONE-acetaminophen (PERCOCET/ROXICET) 5-325 MG per tablet Take 1 tablet by mouth at bedtime as needed for severe pain. 04/16/14   Bartholomew Crews, MD   BP 161/95 mmHg  Pulse 75  Temp(Src) 98.1 F (36.7 C) (Oral)  Resp 20  Ht 4\' 11"  (1.499 m)  Wt 158 lb (71.668 kg)  BMI 31.89 kg/m2  SpO2 99% Physical Exam  Constitutional: She is oriented to person, place, and time. She appears well-developed and well-nourished.  Non-toxic appearance. She does not have a sickly  appearance. She does not appear ill. No distress.  HENT:  Head: Normocephalic and atraumatic.  Neck: Neck supple.  Pulmonary/Chest: Effort normal. No respiratory distress.  Musculoskeletal:       Left knee: She exhibits normal range of motion, no swelling, no effusion, no deformity, no erythema, no LCL laxity and no MCL laxity. No medial joint line and no lateral joint line tenderness noted.       Right ankle: She exhibits normal range of motion, no swelling, no ecchymosis and no deformity.       Left ankle: She exhibits normal range of motion, no swelling, no ecchymosis and no deformity.  Left knee: Positive apley's test.  Neurological: She is alert and oriented to person, place, and time.  Skin: Skin is warm and dry. She is not diaphoretic.  Psychiatric: She has a normal mood and affect. Her behavior is normal.  Nursing note and vitals reviewed.   ED Course  Procedures (including critical care time) Labs Review Labs Reviewed - No data to display  Imaging Review No results found.   EKG Interpretation None      MDM   Final diagnoses:  Left knee pain  Plantar fasciitis, bilateral   Patient presents with history of plantar fasciitis presents with right and left heel discomfort no injury, no signs of infected joint, no signs of gout. Patient also complains of nontraumatic left knee pain likely meniscal versus ligamentous injury. Plan to treat with anti-inflammatories, narcotic pain medication and orthopedic follow-up, knee sleeve given. Ice, elevation encourage. No sign of infection of knee joint. Meds given in ED:  Medications  oxyCODONE-acetaminophen (PERCOCET/ROXICET) 5-325 MG per tablet 1 tablet (1 tablet Oral Given 05/02/14 0807)    Discharge Medication List as of 05/02/2014  8:03 AM    START taking these medications   Details  HYDROcodone-acetaminophen (NORCO/VICODIN) 5-325 MG per tablet Take 1 tablet by mouth every 6 (six) hours as needed., Starting 05/02/2014, Until  Discontinued, Print    ibuprofen (ADVIL,MOTRIN) 800 MG tablet Take 1 tablet (800 mg total) by mouth 3 (three) times daily., Starting 05/02/2014, Until Discontinued, Print        Harvie Heck, PA-C 05/02/14 Bell, DO 05/02/14 1528

## 2014-05-02 NOTE — ED Notes (Signed)
Pt reports left knee pain x 1 week with mild swelling, denies injury to knee. Ambulatory on arrival.

## 2014-05-02 NOTE — Discharge Instructions (Signed)
Call an orthopedic specialist for further evaluation of your knee pain and foot pain. Call for a follow up appointment with a Family or Primary Care Provider.  Return if Symptoms worsen.   Take medication as prescribed.

## 2014-05-02 NOTE — ED Notes (Signed)
Declined W/C at D/C and was escorted to lobby by RN. 

## 2014-05-13 ENCOUNTER — Encounter: Payer: Self-pay | Admitting: Sports Medicine

## 2014-05-13 ENCOUNTER — Ambulatory Visit (INDEPENDENT_AMBULATORY_CARE_PROVIDER_SITE_OTHER): Payer: No Typology Code available for payment source | Admitting: Sports Medicine

## 2014-05-13 ENCOUNTER — Other Ambulatory Visit: Payer: Self-pay | Admitting: *Deleted

## 2014-05-13 ENCOUNTER — Encounter: Payer: Self-pay | Admitting: *Deleted

## 2014-05-13 VITALS — BP 179/116 | HR 86 | Ht 59.0 in | Wt 150.0 lb

## 2014-05-13 DIAGNOSIS — I10 Essential (primary) hypertension: Secondary | ICD-10-CM

## 2014-05-13 DIAGNOSIS — M722 Plantar fascial fibromatosis: Secondary | ICD-10-CM

## 2014-05-13 DIAGNOSIS — M5412 Radiculopathy, cervical region: Secondary | ICD-10-CM

## 2014-05-13 MED ORDER — OXYCODONE-ACETAMINOPHEN 5-325 MG PO TABS: 1.0000 | ORAL_TABLET | Freq: Every evening | ORAL | Status: DC | PRN

## 2014-05-13 MED ORDER — LISINOPRIL-HYDROCHLOROTHIAZIDE 20-25 MG PO TABS: 1.0000 | ORAL_TABLET | Freq: Every day | ORAL | Status: DC

## 2014-05-13 MED ORDER — METHYLPREDNISOLONE ACETATE 40 MG/ML IJ SUSP
40.0000 mg | Freq: Once | INTRAMUSCULAR | Status: AC
  Administered 2014-05-13: 40 mg via INTRA_ARTICULAR

## 2014-05-13 NOTE — Telephone Encounter (Signed)
Pt would like to get Rx while she is here. Due on 11/23

## 2014-05-13 NOTE — Telephone Encounter (Signed)
I reviewed sports med's notes and they indicate no CI to opioid tx. Only using 30 per 30 days. Acute pain and complying with tx plan. I refilled.

## 2014-05-13 NOTE — Progress Notes (Unsigned)
Pt came by clinic after visit to sports medicine.  She was having injections to her ankle area. Her BP at S/M was 179/111 and they asked her to see her PCP about the elevation.  Dr Lynnae January in to see pt and BP rechecked with reading of 160/108 pulse 85 Pt has no complaints at this time but has a lot going on that is stressing her.

## 2014-05-13 NOTE — Patient Instructions (Signed)
Tuli's Heel cups for both feet Arch strap for the right Stretching the arch and twist  I'm referring you to see one of the Spine Doctors at [facility]

## 2014-05-13 NOTE — Progress Notes (Signed)
[name] - 53 y.o. female MRN [id number]  Date of birth: [birth year]  CC & HPI:  [name] is here to follow up: Left Arm Pain: patient has had long-standing left arm pain coming from cervical radiculitis. She has recently undergone a epidural steroid injection and reports remarkable improvement for a couple of hours following the injection but her symptoms began to come back within days. She continues to have perceived left-sided grip strength weakness. Pain that radiates from her neck into her left pinky finger.  MRI reveals spondylosis without significant disc protrusion. She denies any change in bowel or bladder. She does have other symptoms as below she does not associate these with her neck pain.  She would also like to have the following evaluated today: Right Heel Pain: she's had 6 weeks of worsening right heel pain. This is consistent with prior episodes of plantar fasciitis but seems to be more secure. She's been trying ice bottle massage and gentle stretching. Pain is worse over the medial aspect and worse with first step in the morning or with prolonged standing.  Left medial Knee Pain: patient reports acute onset of left knee pain approximately 2 weeks ago. She reports a significant improvement since that time but she does have tenderness over the medial joint line. She denies any clicking, locking or giving way. She has noticed a small effusion. She's been taking ibuprofen on a regular basis.  Of note her hypertension is poorly controlled she reports good compliance with her medications. She has been taking ibuprofen on a regular basis.  She denies any chest pain, shortness of breath, orthopnea or dyspnea on exertion.  ROS:  Per HPI.   HISTORY: Past Medical, Surgical, Social, and Family History Reviewed & Updated per EMR.  Pertinent Historical Findings include:  She is down to smoking 3 cigarettes per day and is working on quitting  Currently out of work due to the discomfort  in her left arm Was limited due to lack of insurance for quite some time and recently obtained insurance.  OBJECTIVE:  VS:   HT:4\' 11"  (149.9 cm)   WT:150 lb (68.04 kg)  BMI:30.4          BP:(!) 179/116 mmHg  HR:86bpm  TEMP: ( )  RESP:   PHYSICAL EXAM: GENERAL: Adult African-American  female. In no discomfort; no respiratory distress   PSYCH: alert and appropriate, good insight   NEURO: Slightly diminished sensation in left upper extremity to light touch. Upper extremity DTRs 2+ out of 4 in the right upper extremity and in left biceps and brachial radialis. 1 minus out of 4 in left triceps  VASCULAR: Radial pulse 2+ out of 4 pulses 2+/4.  No significant edema.    neck EXAM: Patient holds her neck in a straight position. Loss of cervical lordosis. She has bilateral brachial plexus tenderness. She has limited flexion and extension, sidebending and rotation equally. Positive Spurling's compression test  foot EXAM:  Overall normal-appearing. She is markedly tender over the right insertion of the plantar fascia. No significant bruising or ecchymosis. She has full ankle range of motion.  Pain with plantar fascial stretch. Minimal Achilles tenderness.  Left knee EXAM: Overall normal-appearing. Slight approximately 20 mL effusion. Medial joint line tenderness. Pain with valgus stress but stable to varus and valgus stress. Stable with anterior posterior drawer. Positive McMurray. Extensor mechanism intact    Limited MSK Ultrasound of Bilateral Plantar Fascia: Findings: Thicked PF bilaterally.  Right measures 2mm and left is 46mm  Impression: The above findings are consistent with Right Plantar fasciitis     ASSESSMENT: 1. Cervical radiculitis   2. Essential hypertension, benign   3. Plantar fasciitis of right foot    PROCEDURE NOTE : Right PF Injection After discussing the risks, benefits and expected outcomes of the injection and all questions were reviewed and answered,  she wished to  undergo the above named procedure.  Written consent was obtained. After an appropriate time out was taken the right heel was sterilely prepped and injected as below: Prep:    Betadine and alcohol,  Ethel chloride.  Approach:  lateral Needle:  22g 1.5 inch needle Meds:   3cc 1% lidocaine, 1cc 40mg  Depo-Medrol A bandaid was applied to the area. This procedure was well tolerated and there were no complications.    PLAN: See problem based charting & AVS for additional documentation. - she will follow up with Bartholomew Crews, MD to further discuss BP control & smoking cessation - for her cervical radiculitis this is likely coming from cervical spondylosis and we will plan is have her see Dr. Lorin Mercy at [facility] orthopedics to consideration of surgical intervention given the persistent symptoms that have been unrelenting to conservative therapy including, ESI, gabapentin, PT.  - Plantar fascial injection today as above. Given arch strap; recommend heel cups - for her knee pain likely medial meniscus versus degenerative arthritic changes. She reports overall this is better we will defer any other therapy at this time but have instructed her to follow-up if any worsening.  > Will need to progress plantar fascial rehabilitation at follow-up visit > Return in about 2 weeks (around 05/27/2014) for follow evaluation of knee if not improved.    > consider arthrocentesis and injection for left knee if any plateau in improvement. Continue compression

## 2014-05-13 NOTE — Assessment & Plan Note (Signed)
BP remains elevated. Had always said was 2/2 pain but pain is lingering and BP trending up despite lisinopril 20. Will change to lisinopril HCTZ 20/25. Return Dec 28th Monday 1:15 with me.

## 2014-05-13 NOTE — Progress Notes (Signed)
See order entry note.

## 2014-05-17 ENCOUNTER — Emergency Department (HOSPITAL_COMMUNITY)
Admission: EM | Admit: 2014-05-17 | Discharge: 2014-05-17 | Disposition: A | Discharge status: Home / Self Care | Payer: No Typology Code available for payment source | Attending: Emergency Medicine | Admitting: Emergency Medicine

## 2014-05-17 ENCOUNTER — Encounter (HOSPITAL_COMMUNITY): Payer: Self-pay | Admitting: Family Medicine

## 2014-05-17 ENCOUNTER — Encounter (HOSPITAL_COMMUNITY): Payer: Self-pay | Admitting: Emergency Medicine

## 2014-05-17 ENCOUNTER — Emergency Department (HOSPITAL_COMMUNITY)
Admission: EM | Admit: 2014-05-17 | Discharge: 2014-05-17 | Discharge status: Against Medical Advice | Payer: No Typology Code available for payment source | Source: Home / Self Care | Attending: Emergency Medicine | Admitting: Emergency Medicine

## 2014-05-17 DIAGNOSIS — Z720 Tobacco use: Secondary | ICD-10-CM | POA: Diagnosis not present

## 2014-05-17 DIAGNOSIS — Z791 Long term (current) use of non-steroidal anti-inflammatories (NSAID): Secondary | ICD-10-CM | POA: Diagnosis not present

## 2014-05-17 DIAGNOSIS — M722 Plantar fascial fibromatosis: Secondary | ICD-10-CM | POA: Diagnosis not present

## 2014-05-17 DIAGNOSIS — R197 Diarrhea, unspecified: Secondary | ICD-10-CM | POA: Insufficient documentation

## 2014-05-17 DIAGNOSIS — K219 Gastro-esophageal reflux disease without esophagitis: Secondary | ICD-10-CM | POA: Insufficient documentation

## 2014-05-17 DIAGNOSIS — E860 Dehydration: Secondary | ICD-10-CM | POA: Insufficient documentation

## 2014-05-17 DIAGNOSIS — M1990 Unspecified osteoarthritis, unspecified site: Secondary | ICD-10-CM | POA: Diagnosis not present

## 2014-05-17 DIAGNOSIS — R1110 Vomiting, unspecified: Secondary | ICD-10-CM | POA: Diagnosis not present

## 2014-05-17 DIAGNOSIS — D649 Anemia, unspecified: Secondary | ICD-10-CM | POA: Diagnosis not present

## 2014-05-17 DIAGNOSIS — Z8639 Personal history of other endocrine, nutritional and metabolic disease: Secondary | ICD-10-CM | POA: Insufficient documentation

## 2014-05-17 DIAGNOSIS — I10 Essential (primary) hypertension: Secondary | ICD-10-CM | POA: Insufficient documentation

## 2014-05-17 DIAGNOSIS — R1084 Generalized abdominal pain: Secondary | ICD-10-CM | POA: Diagnosis present

## 2014-05-17 DIAGNOSIS — R109 Unspecified abdominal pain: Secondary | ICD-10-CM | POA: Insufficient documentation

## 2014-05-17 DIAGNOSIS — Z79899 Other long term (current) drug therapy: Secondary | ICD-10-CM | POA: Diagnosis not present

## 2014-05-17 DIAGNOSIS — N390 Urinary tract infection, site not specified: Secondary | ICD-10-CM | POA: Insufficient documentation

## 2014-05-17 LAB — COMPREHENSIVE METABOLIC PANEL
ALBUMIN: 4.9 g/dL (ref 3.5–5.2)
ALT: 34 U/L (ref 0–35)
AST: 20 U/L (ref 0–37)
Alkaline Phosphatase: 105 U/L (ref 39–117)
Anion gap: 24 — ABNORMAL HIGH (ref 5–15)
BUN: 33 mg/dL — ABNORMAL HIGH (ref 6–23)
CALCIUM: 10.1 mg/dL (ref 8.4–10.5)
CO2: 21 meq/L (ref 19–32)
CREATININE: 1.31 mg/dL — AB (ref 0.50–1.10)
Chloride: 97 mEq/L (ref 96–112)
GFR calc Af Amer: 53 mL/min — ABNORMAL LOW (ref >90)
GFR, EST NON AFRICAN AMERICAN: 46 mL/min — AB (ref >90)
Glucose, Bld: 107 mg/dL — ABNORMAL HIGH (ref 70–99)
Potassium: 3.8 mEq/L (ref 3.7–5.3)
SODIUM: 142 meq/L (ref 137–147)
Total Bilirubin: 0.3 mg/dL (ref 0.3–1.2)
Total Protein: 9.2 g/dL — ABNORMAL HIGH (ref 6.0–8.3)

## 2014-05-17 LAB — CBC WITH DIFFERENTIAL/PLATELET
BASOS PCT: 0 % (ref 0–1)
Basophils Absolute: 0 10*3/uL (ref 0.0–0.1)
Eosinophils Absolute: 0 10*3/uL (ref 0.0–0.7)
Eosinophils Relative: 0 % (ref 0–5)
HCT: 40.6 % (ref 36.0–46.0)
Hemoglobin: 13.3 g/dL (ref 12.0–15.0)
LYMPHS PCT: 22 % (ref 12–46)
Lymphs Abs: 2.1 10*3/uL (ref 0.7–4.0)
MCH: 22.6 pg — ABNORMAL LOW (ref 26.0–34.0)
MCHC: 32.8 g/dL (ref 30.0–36.0)
MCV: 68.9 fL — ABNORMAL LOW (ref 78.0–100.0)
MONO ABS: 0.8 10*3/uL (ref 0.1–1.0)
Monocytes Relative: 8 % (ref 3–12)
NEUTROS PCT: 70 % (ref 43–77)
Neutro Abs: 6.6 10*3/uL (ref 1.7–7.7)
PLATELETS: 354 10*3/uL (ref 150–400)
RBC: 5.89 MIL/uL — AB (ref 3.87–5.11)
RDW: 14.3 % (ref 11.5–15.5)
SMEAR REVIEW: ADEQUATE
WBC: 9.5 10*3/uL (ref 4.0–10.5)

## 2014-05-17 LAB — BASIC METABOLIC PANEL
Anion gap: 18 — ABNORMAL HIGH (ref 5–15)
BUN: 34 mg/dL — ABNORMAL HIGH (ref 6–23)
CHLORIDE: 105 meq/L (ref 96–112)
CO2: 22 mEq/L (ref 19–32)
CREATININE: 1.32 mg/dL — AB (ref 0.50–1.10)
Calcium: 8.2 mg/dL — ABNORMAL LOW (ref 8.4–10.5)
GFR calc non Af Amer: 45 mL/min — ABNORMAL LOW (ref >90)
GFR, EST AFRICAN AMERICAN: 53 mL/min — AB (ref >90)
Glucose, Bld: 89 mg/dL (ref 70–99)
Potassium: 3.7 mEq/L (ref 3.7–5.3)
SODIUM: 145 meq/L (ref 137–147)

## 2014-05-17 LAB — URINALYSIS, ROUTINE W REFLEX MICROSCOPIC
Bilirubin Urine: NEGATIVE
Glucose, UA: NEGATIVE mg/dL
KETONES UR: 15 mg/dL — AB
LEUKOCYTES UA: NEGATIVE
Nitrite: NEGATIVE
PH: 5 (ref 5.0–8.0)
Protein, ur: 30 mg/dL — AB
Specific Gravity, Urine: 1.02 (ref 1.005–1.030)
Urobilinogen, UA: 0.2 mg/dL (ref 0.0–1.0)

## 2014-05-17 LAB — URINE MICROSCOPIC-ADD ON

## 2014-05-17 LAB — LIPASE, BLOOD: Lipase: 14 U/L (ref 11–59)

## 2014-05-17 MED ORDER — LORAZEPAM 1 MG PO TABS: 1.0000 mg | ORAL_TABLET | Freq: Four times a day (QID) | ORAL | Status: DC | PRN

## 2014-05-17 MED ORDER — CEPHALEXIN 500 MG PO CAPS: ORAL_CAPSULE | ORAL | Status: DC

## 2014-05-17 MED ORDER — METOCLOPRAMIDE HCL 5 MG/ML IJ SOLN
10.0000 mg | Freq: Once | INTRAMUSCULAR | Status: AC
  Administered 2014-05-17: 10 mg via INTRAVENOUS
  Filled 2014-05-17: qty 2

## 2014-05-17 MED ORDER — METOCLOPRAMIDE HCL 10 MG PO TABS: 10.0000 mg | ORAL_TABLET | Freq: Four times a day (QID) | ORAL | Status: DC | PRN

## 2014-05-17 MED ORDER — MORPHINE SULFATE 4 MG/ML IJ SOLN
6.0000 mg | Freq: Once | INTRAMUSCULAR | Status: AC
  Administered 2014-05-17: 6 mg via INTRAVENOUS
  Filled 2014-05-17: qty 2

## 2014-05-17 MED ORDER — LORAZEPAM 2 MG/ML IJ SOLN
1.0000 mg | Freq: Once | INTRAMUSCULAR | Status: AC
  Administered 2014-05-17: 1 mg via INTRAVENOUS
  Filled 2014-05-17: qty 1

## 2014-05-17 MED ORDER — LOPERAMIDE HCL 2 MG PO CAPS: ORAL_CAPSULE | ORAL | Status: DC

## 2014-05-17 MED ORDER — DIPHENHYDRAMINE HCL 50 MG/ML IJ SOLN
25.0000 mg | Freq: Once | INTRAMUSCULAR | Status: AC
  Administered 2014-05-17: 25 mg via INTRAVENOUS
  Filled 2014-05-17: qty 1

## 2014-05-17 MED ORDER — SODIUM CHLORIDE 0.9 % IV BOLUS (SEPSIS)
2000.0000 mL | Freq: Once | INTRAVENOUS | Status: AC
  Administered 2014-05-17: 2000 mL via INTRAVENOUS

## 2014-05-17 MED ORDER — ONDANSETRON 8 MG PO TBDP: ORAL_TABLET | ORAL | Status: DC

## 2014-05-17 MED ORDER — SODIUM CHLORIDE 0.9 % IV BOLUS (SEPSIS)
500.0000 mL | Freq: Once | INTRAVENOUS | Status: AC
  Administered 2014-05-17: 500 mL via INTRAVENOUS

## 2014-05-17 MED ORDER — FENTANYL CITRATE 0.05 MG/ML IJ SOLN
100.0000 ug | Freq: Once | INTRAMUSCULAR | Status: AC
  Administered 2014-05-17: 100 ug via INTRAVENOUS
  Filled 2014-05-17: qty 2

## 2014-05-17 MED ORDER — ONDANSETRON 4 MG PO TBDP
8.0000 mg | ORAL_TABLET | Freq: Once | ORAL | Status: AC
  Administered 2014-05-17: 8 mg via ORAL
  Filled 2014-05-17: qty 2

## 2014-05-17 NOTE — Discharge Instructions (Signed)

## 2014-05-17 NOTE — ED Notes (Signed)
I gave the patient some ice chips per Dr. Stevie Kern.

## 2014-05-17 NOTE — ED Notes (Signed)
Pt ambulating independently w/ steady gait on d/c in no acute distress, A&Ox4. D/c instructions reviewed w/ pt and family - pt and family deny any further questions or concerns at present. Rx given x5  

## 2014-05-17 NOTE — ED Notes (Signed)
Per pt sts abdominal pain and cramping. sts when she bends over the pain runs from abdomen to feet. sts N,V,D.

## 2014-05-17 NOTE — ED Notes (Signed)
Pt having pain in left side- writhing in pain, pain comes in waves.

## 2014-05-17 NOTE — ED Provider Notes (Signed)
CSN: [id number]     Arrival date & time 05/17/14  1336 History   First MD Initiated Contact with Patient 05/17/14 1517     Chief Complaint  Patient presents with  . Abdominal Pain     (Consider location/radiation/quality/duration/timing/severity/associated sxs/prior Treatment) HPI 53 year old female presents with 1 day of multiple episodes of nonbloody vomiting and diarrhea with lightheadedness feeling dehydrated and intermittent moderately severe diffuse abdominal cramping pains without constant abdominal pain or localized abdominal pain without fever without body aches without rash without chest pain without cough without shortness of breath and without known exposures to acute illness recently. There is no treatment prior to arrival. Abdominal pains occur suddenly intermittently last less than a minute at a time worse with movement. Currently pain-free with no nausea. Past Medical History  Diagnosis Date  . Hyperlipidemia   . Hiatal hernia   . GERD (gastroesophageal reflux disease)   . Hypertension   . Anemia   . Arthritis   . Plantar fasciitis    Past Surgical History  Procedure Laterality Date  . Abdominal hysterectomy  12/09    Secondary to fibroids  . Cesarean section     Family History  Problem Relation Age of Onset  . Cancer Mother 3    pancreatic cancer  . Pancreatic cancer Mother   . Cancer Father 37    throat cancer  . Cancer Maternal Aunt 60    breast cancer  . Cancer Other     Died  from Colon cancer in 61's  . Colon cancer Maternal Uncle   . Colon cancer Paternal Uncle    History  Substance Use Topics  . Smoking status: Current Every Day Smoker -- 0.10 packs/day    Types: Cigarettes  . Smokeless tobacco: Never Used     Comment: Down to 1-2 cig/day.  . Alcohol Use: Yes     Comment: occasionally   OB History    No data available     Review of Systems 10 Systems reviewed and are negative for acute change except as noted in the HPI.   Allergies   Aspirin and Hydromorphone hcl  Home Medications   Prior to Admission medications   Medication Sig Start Date End Date Taking? Authorizing Provider  ferrous sulfate (IRON SUPPLEMENT) 325 (65 FE) MG tablet Take 325 mg by mouth 3 (three) times daily.    Yes Historical Provider, MD  gabapentin (NEURONTIN) 300 MG capsule Take 1 capsule (300 mg total) by mouth 3 (three) times daily. 02/11/14  Yes Gerda Diss, DO  ibuprofen (ADVIL,MOTRIN) 800 MG tablet Take 1 tablet (800 mg total) by mouth 3 (three) times daily. 05/02/14  Yes Harvie Heck, PA-C  lisinopril-hydrochlorothiazide (PRINZIDE,ZESTORETIC) 20-25 MG per tablet Take 1 tablet by mouth daily. 05/13/14  Yes Bartholomew Crews, MD  omeprazole (PRILOSEC) 20 MG capsule Take 1 capsule (20 mg total) by mouth daily. 05/19/13  Yes Bartholomew Crews, MD  oxyCODONE-acetaminophen (PERCOCET/ROXICET) 5-325 MG per tablet Take 1 tablet by mouth at bedtime as needed for severe pain. 05/13/14  Yes Bartholomew Crews, MD  cephALEXin (KEFLEX) 500 MG capsule 2 caps po bid x 3 days 05/17/14   Babette Relic, MD  HYDROcodone-acetaminophen (NORCO/VICODIN) 5-325 MG per tablet Take 1 tablet by mouth every 6 (six) hours as needed. 05/02/14   Harvie Heck, PA-C  lisinopril (PRINIVIL,ZESTRIL) 20 MG tablet Take 1 tablet (20 mg total) by mouth daily. Patient not taking: Reported on 05/17/2014 01/08/14   Bartholomew Crews, MD  loperamide (IMODIUM) 2 MG capsule Take two tabs po initially, then one tab after each loose stool: max 8 tabs in 24 hours 05/17/14   Babette Relic, MD  LORazepam (ATIVAN) 1 MG tablet Take 1 tablet (1 mg total) by mouth every 6 (six) hours as needed (spasms). 05/17/14   Babette Relic, MD  metoCLOPramide (REGLAN) 10 MG tablet Take 1 tablet (10 mg total) by mouth every 6 (six) hours as needed for nausea (nausea/headache). 05/17/14   Babette Relic, MD  ondansetron (ZOFRAN ODT) 8 MG disintegrating tablet 8mg  ODT q4 hours prn nausea 05/17/14   Babette Relic, MD   BP 108/65 mmHg  Pulse 93  Temp(Src) 98 F (36.7 C) (Oral)  Resp 18  SpO2 95% Physical Exam  Constitutional:  Awake, alert, nontoxic appearance.  HENT:  Head: Atraumatic.  Eyes: Right eye exhibits no discharge. Left eye exhibits no discharge.  Neck: Neck supple.  Cardiovascular: Normal rate and regular rhythm.   No murmur heard. Pulmonary/Chest: Effort normal and breath sounds normal. No respiratory distress. She has no wheezes. She has no rales. She exhibits no tenderness.  Abdominal: Soft. Bowel sounds are normal. She exhibits no distension and no mass. There is no tenderness. There is no rebound and no guarding.  Musculoskeletal: She exhibits no tenderness.  Baseline ROM, no obvious new focal weakness.  Neurological: She is alert.  Mental status and motor strength appears baseline for patient and situation.  Skin: No rash noted.  Psychiatric: She has a normal mood and affect.  Nursing note and vitals reviewed.   ED Course  Procedures (including critical care time) Labs Review Labs Reviewed  CBC WITH DIFFERENTIAL - Abnormal; Notable for the following:    RBC 5.89 (*)    MCV 68.9 (*)    MCH 22.6 (*)    All other components within normal limits  COMPREHENSIVE METABOLIC PANEL - Abnormal; Notable for the following:    Glucose, Bld 107 (*)    BUN 33 (*)    Creatinine, Ser 1.31 (*)    Total Protein 9.2 (*)    GFR calc non Af Amer 46 (*)    GFR calc Af Amer 53 (*)    Anion gap 24 (*)    All other components within normal limits  URINALYSIS, ROUTINE W REFLEX MICROSCOPIC - Abnormal; Notable for the following:    APPearance CLOUDY (*)    Hgb urine dipstick TRACE (*)    Ketones, ur 15 (*)    Protein, ur 30 (*)    All other components within normal limits  BASIC METABOLIC PANEL - Abnormal; Notable for the following:    BUN 34 (*)    Creatinine, Ser 1.32 (*)    Calcium 8.2 (*)    GFR calc non Af Amer 45 (*)    GFR calc Af Amer 53 (*)    Anion gap 18 (*)     All other components within normal limits  URINE MICROSCOPIC-ADD ON - Abnormal; Notable for the following:    Squamous Epithelial / LPF FEW (*)    Bacteria, UA MANY (*)    Casts HYALINE CASTS (*)    Crystals CA OXALATE CRYSTALS (*)    All other components within normal limits  URINE CULTURE  LIPASE, BLOOD    Imaging Review No results found.   EKG Interpretation None      MDM   Final diagnoses:  Vomiting and diarrhea  UTI (lower urinary tract infection)  Patient informed of clinical course, understand medical decision-making process, and agree with plan. I doubt any other EMC precluding discharge at this time including, but not necessarily limited to the following:peritonitis.   Babette Relic, MD 05/21/14 [phone]

## 2014-05-17 NOTE — ED Notes (Addendum)
Per EMS, pt had diarrhea this morning and then started with abdominal pain.  Leg cramping. Abdominal pain throughout abdomen.  Did drink 40 last night. Calm during transport but yelling out in pain on arrival to ED.  Started lisinopril Friday for bp and had steroid shot for feet on Friday.  Was seen by primary.  Vitals:  180/70,  Hr 110, resp 20, 97% ra

## 2014-05-17 NOTE — ED Notes (Signed)
Pt may have ice chips per Dr. Stevie Kern

## 2014-05-18 ENCOUNTER — Telehealth: Payer: Self-pay | Admitting: *Deleted

## 2014-05-18 NOTE — Telephone Encounter (Signed)
Pt called to tell Dr Lynnae January that she was rushed to ED via EMS yesterday.  She started having cramps in abd and legs on Sunday morning.  She had vomiting and diarrhea.   She was started on Zofran, Imodium, Reglan, Keflex and Ativan  Scheduled a ED f/u on Wednesday in clinic.

## 2014-05-19 LAB — URINE CULTURE: Colony Count: 30000

## 2014-05-20 ENCOUNTER — Telehealth: Payer: Self-pay | Admitting: *Deleted

## 2014-05-20 ENCOUNTER — Ambulatory Visit: Payer: No Typology Code available for payment source | Admitting: Pulmonary Disease

## 2014-05-20 NOTE — Telephone Encounter (Signed)
Made referral appt at [place] with Dr. Lorin Mercy, Mon. Dec 14 @1 :45pm.  Called and left msg for patient.

## 2014-05-25 ENCOUNTER — Ambulatory Visit: Payer: No Typology Code available for payment source | Admitting: Internal Medicine

## 2014-05-26 DIAGNOSIS — I639 Cerebral infarction, unspecified: Secondary | ICD-10-CM

## 2014-05-26 HISTORY — DX: Cerebral infarction, unspecified: I63.9

## 2014-05-27 ENCOUNTER — Ambulatory Visit: Payer: No Typology Code available for payment source | Admitting: Sports Medicine

## 2014-06-08 ENCOUNTER — Other Ambulatory Visit: Payer: Self-pay | Admitting: *Deleted

## 2014-06-12 ENCOUNTER — Encounter (HOSPITAL_COMMUNITY): Payer: Self-pay | Admitting: Radiology

## 2014-06-12 ENCOUNTER — Observation Stay (HOSPITAL_COMMUNITY): Payer: No Typology Code available for payment source

## 2014-06-12 ENCOUNTER — Emergency Department (HOSPITAL_COMMUNITY): Payer: No Typology Code available for payment source

## 2014-06-12 ENCOUNTER — Observation Stay (HOSPITAL_COMMUNITY)
Admission: EM | Admit: 2014-06-12 | Discharge: 2014-06-14 | Disposition: A | Discharge status: Home / Self Care | Payer: No Typology Code available for payment source | Attending: Internal Medicine | Admitting: Internal Medicine

## 2014-06-12 DIAGNOSIS — E785 Hyperlipidemia, unspecified: Secondary | ICD-10-CM | POA: Diagnosis present

## 2014-06-12 DIAGNOSIS — R51 Headache: Secondary | ICD-10-CM | POA: Diagnosis not present

## 2014-06-12 DIAGNOSIS — Z720 Tobacco use: Secondary | ICD-10-CM | POA: Insufficient documentation

## 2014-06-12 DIAGNOSIS — R42 Dizziness and giddiness: Secondary | ICD-10-CM | POA: Insufficient documentation

## 2014-06-12 DIAGNOSIS — K219 Gastro-esophageal reflux disease without esophagitis: Secondary | ICD-10-CM | POA: Insufficient documentation

## 2014-06-12 DIAGNOSIS — R29810 Facial weakness: Secondary | ICD-10-CM | POA: Diagnosis present

## 2014-06-12 DIAGNOSIS — F1420 Cocaine dependence, uncomplicated: Secondary | ICD-10-CM

## 2014-06-12 DIAGNOSIS — G459 Transient cerebral ischemic attack, unspecified: Secondary | ICD-10-CM

## 2014-06-12 DIAGNOSIS — Z79899 Other long term (current) drug therapy: Secondary | ICD-10-CM | POA: Diagnosis not present

## 2014-06-12 DIAGNOSIS — M1990 Unspecified osteoarthritis, unspecified site: Secondary | ICD-10-CM | POA: Insufficient documentation

## 2014-06-12 DIAGNOSIS — F17200 Nicotine dependence, unspecified, uncomplicated: Secondary | ICD-10-CM | POA: Diagnosis present

## 2014-06-12 DIAGNOSIS — R200 Anesthesia of skin: Secondary | ICD-10-CM | POA: Diagnosis not present

## 2014-06-12 DIAGNOSIS — K449 Diaphragmatic hernia without obstruction or gangrene: Secondary | ICD-10-CM | POA: Insufficient documentation

## 2014-06-12 DIAGNOSIS — I10 Essential (primary) hypertension: Secondary | ICD-10-CM | POA: Diagnosis not present

## 2014-06-12 DIAGNOSIS — M5412 Radiculopathy, cervical region: Secondary | ICD-10-CM | POA: Diagnosis present

## 2014-06-12 DIAGNOSIS — M722 Plantar fascial fibromatosis: Secondary | ICD-10-CM | POA: Insufficient documentation

## 2014-06-12 DIAGNOSIS — R569 Unspecified convulsions: Secondary | ICD-10-CM

## 2014-06-12 DIAGNOSIS — D649 Anemia, unspecified: Secondary | ICD-10-CM | POA: Diagnosis not present

## 2014-06-12 DIAGNOSIS — F17210 Nicotine dependence, cigarettes, uncomplicated: Secondary | ICD-10-CM

## 2014-06-12 DIAGNOSIS — Z791 Long term (current) use of non-steroidal anti-inflammatories (NSAID): Secondary | ICD-10-CM | POA: Diagnosis not present

## 2014-06-12 DIAGNOSIS — Z8673 Personal history of transient ischemic attack (TIA), and cerebral infarction without residual deficits: Secondary | ICD-10-CM | POA: Diagnosis present

## 2014-06-12 LAB — COMPREHENSIVE METABOLIC PANEL
ALT: 25 U/L (ref 0–35)
AST: 25 U/L (ref 0–37)
Albumin: 3.6 g/dL (ref 3.5–5.2)
Alkaline Phosphatase: 90 U/L (ref 39–117)
Anion gap: 14 (ref 5–15)
BUN: 17 mg/dL (ref 6–23)
CO2: 25 meq/L (ref 19–32)
Calcium: 9.4 mg/dL (ref 8.4–10.5)
Chloride: 103 mEq/L (ref 96–112)
Creatinine, Ser: 0.76 mg/dL (ref 0.50–1.10)
GFR calc Af Amer: >90 mL/min (ref >90)
GLUCOSE: 93 mg/dL (ref 70–99)
Potassium: 4.1 mEq/L (ref 3.7–5.3)
Sodium: 142 mEq/L (ref 137–147)
Total Bilirubin: <0.2 mg/dL — ABNORMAL LOW (ref 0.3–1.2)
Total Protein: 7.1 g/dL (ref 6.0–8.3)

## 2014-06-12 LAB — DIFFERENTIAL
Basophils Absolute: 0 10*3/uL (ref 0.0–0.1)
Basophils Relative: 0 % (ref 0–1)
EOS PCT: 2 % (ref 0–5)
Eosinophils Absolute: 0.1 10*3/uL (ref 0.0–0.7)
LYMPHS ABS: 3.6 10*3/uL (ref 0.7–4.0)
Lymphocytes Relative: 48 % — ABNORMAL HIGH (ref 12–46)
Monocytes Absolute: 0.4 10*3/uL (ref 0.1–1.0)
Monocytes Relative: 6 % (ref 3–12)
Neutro Abs: 3.2 10*3/uL (ref 1.7–7.7)
Neutrophils Relative %: 44 % (ref 43–77)

## 2014-06-12 LAB — CBC
HCT: 32.3 % — ABNORMAL LOW (ref 36.0–46.0)
Hemoglobin: 10.3 g/dL — ABNORMAL LOW (ref 12.0–15.0)
MCH: 22.7 pg — AB (ref 26.0–34.0)
MCHC: 31.9 g/dL (ref 30.0–36.0)
MCV: 71.3 fL — AB (ref 78.0–100.0)
Platelets: 290 10*3/uL (ref 150–400)
RBC: 4.53 MIL/uL (ref 3.87–5.11)
RDW: 14 % (ref 11.5–15.5)
WBC: 7.3 10*3/uL (ref 4.0–10.5)

## 2014-06-12 LAB — URINALYSIS, ROUTINE W REFLEX MICROSCOPIC
Bilirubin Urine: NEGATIVE
GLUCOSE, UA: NEGATIVE mg/dL
Hgb urine dipstick: NEGATIVE
Ketones, ur: NEGATIVE mg/dL
Leukocytes, UA: NEGATIVE
Nitrite: NEGATIVE
PROTEIN: NEGATIVE mg/dL
Specific Gravity, Urine: 1.022 (ref 1.005–1.030)
Urobilinogen, UA: 0.2 mg/dL (ref 0.0–1.0)
pH: 5 (ref 5.0–8.0)

## 2014-06-12 LAB — RAPID URINE DRUG SCREEN, HOSP PERFORMED
Amphetamines: NOT DETECTED
Barbiturates: NOT DETECTED
Benzodiazepines: NOT DETECTED
Cocaine: POSITIVE — AB
Opiates: NOT DETECTED
Tetrahydrocannabinol: NOT DETECTED

## 2014-06-12 LAB — I-STAT TROPONIN, ED: TROPONIN I, POC: 0 ng/mL (ref 0.00–0.08)

## 2014-06-12 LAB — GLUCOSE, CAPILLARY
Glucose-Capillary: 104 mg/dL — ABNORMAL HIGH (ref 70–99)
Glucose-Capillary: 113 mg/dL — ABNORMAL HIGH (ref 70–99)
Glucose-Capillary: 113 mg/dL — ABNORMAL HIGH (ref 70–99)
Glucose-Capillary: 121 mg/dL — ABNORMAL HIGH (ref 70–99)

## 2014-06-12 LAB — TSH: TSH: 0.965 u[IU]/mL (ref 0.350–4.500)

## 2014-06-12 LAB — APTT: aPTT: 30 seconds (ref 24–37)

## 2014-06-12 LAB — PROTIME-INR
INR: 0.96 (ref 0.00–1.49)
PROTHROMBIN TIME: 12.9 s (ref 11.6–15.2)

## 2014-06-12 LAB — ETHANOL: Alcohol, Ethyl (B): <11 mg/dL (ref 0–11)

## 2014-06-12 LAB — HEMOGLOBIN A1C
Hgb A1c MFr Bld: 5.8 % — ABNORMAL HIGH (ref <5.7)
Mean Plasma Glucose: 120 mg/dL — ABNORMAL HIGH (ref <117)

## 2014-06-12 MED ORDER — FERROUS SULFATE 325 (65 FE) MG PO TABS
325.0000 mg | ORAL_TABLET | Freq: Three times a day (TID) | ORAL | Status: DC
  Administered 2014-06-12 – 2014-06-14 (×7): 325 mg via ORAL
  Filled 2014-06-12 (×7): qty 1

## 2014-06-12 MED ORDER — STROKE: EARLY STAGES OF RECOVERY BOOK
Freq: Once | Status: DC
  Filled 2014-06-12: qty 1

## 2014-06-12 MED ORDER — PANTOPRAZOLE SODIUM 40 MG PO TBEC
40.0000 mg | DELAYED_RELEASE_TABLET | Freq: Every day | ORAL | Status: DC
  Administered 2014-06-12 – 2014-06-14 (×3): 40 mg via ORAL
  Filled 2014-06-12 (×3): qty 1

## 2014-06-12 MED ORDER — LORAZEPAM 2 MG/ML IJ SOLN
0.5000 mg | Freq: Once | INTRAMUSCULAR | Status: AC
  Administered 2014-06-12: 0.5 mg via INTRAVENOUS
  Filled 2014-06-12: qty 1

## 2014-06-12 MED ORDER — GABAPENTIN 300 MG PO CAPS
300.0000 mg | ORAL_CAPSULE | Freq: Three times a day (TID) | ORAL | Status: DC
  Administered 2014-06-12 – 2014-06-14 (×7): 300 mg via ORAL
  Filled 2014-06-12 (×7): qty 1

## 2014-06-12 MED ORDER — KETOROLAC TROMETHAMINE 15 MG/ML IJ SOLN
15.0000 mg | Freq: Four times a day (QID) | INTRAMUSCULAR | Status: DC | PRN
  Administered 2014-06-12: 15 mg via INTRAVENOUS
  Filled 2014-06-12 (×2): qty 1

## 2014-06-12 MED ORDER — LORAZEPAM 2 MG/ML IJ SOLN
0.5000 mg | Freq: Once | INTRAMUSCULAR | Status: AC | PRN
  Administered 2014-06-12 (×2): 0.5 mg via INTRAVENOUS
  Filled 2014-06-12: qty 1

## 2014-06-12 MED ORDER — ACETAMINOPHEN 325 MG PO TABS
650.0000 mg | ORAL_TABLET | Freq: Four times a day (QID) | ORAL | Status: DC | PRN
  Administered 2014-06-12 – 2014-06-14 (×4): 650 mg via ORAL
  Filled 2014-06-12 (×5): qty 2

## 2014-06-12 MED ORDER — ATORVASTATIN CALCIUM 40 MG PO TABS
40.0000 mg | ORAL_TABLET | Freq: Every day | ORAL | Status: DC
  Administered 2014-06-12 – 2014-06-13 (×2): 40 mg via ORAL
  Filled 2014-06-12 (×2): qty 1

## 2014-06-12 NOTE — ED Notes (Signed)
Dr Yelverton at bedside.  

## 2014-06-12 NOTE — ED Notes (Signed)
Camilo, MD at bedside.  

## 2014-06-12 NOTE — Progress Notes (Signed)
Subjective: Before presentation to the hospital, patient reported some difficulty opening her mouth and having numbness on the left side of her body. She also reports having trouble speaking and could not call out to family members. This episode lasted around 5 minutes and she was laying down at the time. After this episode, she was back to normal, but did have a headache. Patient denies a history of headaches. Patient denies any recurrent episodes since being in the hospital. Also denies any nausea.   Objective: Vital signs in last 24 hours: Filed Vitals:   06/12/14 0545 06/12/14 0615 06/12/14 0700 06/12/14 0800  BP: 126/75 122/83 116/68 139/80  Pulse: 79 79 74 66  Temp:   97.7 F (36.5 C) 97.7 F (36.5 C)  TempSrc:   Oral Oral  Resp: 19 17 18 18   Height:      Weight:   157 lb (71.215 kg)   SpO2: 99% 98% 100% 100%   Weight change:  No intake or output data in the 24 hours ending 06/12/14 0940   General: alert, sitting up in bed, NAD HEENT: Waucoma/AT, EOMI, exophthalmos present, mucus membranes moist CV: RRR, normal S1/S2, no m/g/r Pulm: CTA bilaterally, breaths non-labored Abd: BS+, soft, non-tender Ext: warm, no edema, moves all Neuro: alert and oriented x 3. No aphasia. PERRL, EOMI, decreased light sensation on left side of face, left upper extremity, and left lower extremity, smile symmetric, shoulder shrug intact, able to stick out tongue midline, decreased left sided grip strength, intact biceps, triceps, and deltoids bilateral upper extremities. Strength symmetric 5/5 BLE.  Lab Results: Basic Metabolic Panel:  Recent Labs Lab 06/12/14 0233  NA 142  K 4.1  CL 103  CO2 25  GLUCOSE 93  BUN 17  CREATININE 0.76  CALCIUM 9.4   Liver Function Tests:  Recent Labs Lab 06/12/14 0233  AST 25  ALT 25  ALKPHOS 90  BILITOT <0.2*  PROT 7.1  ALBUMIN 3.6   CBC:  Recent Labs Lab 06/12/14 0233  WBC 7.3  NEUTROABS 3.2  HGB 10.3*  HCT 32.3*  MCV 71.3*  PLT 290    CBG:  Recent Labs Lab 06/12/14 0754  GLUCAP 104*   Thyroid Function Tests:  Recent Labs Lab 06/12/14 0741  TSH 0.965   Coagulation:  Recent Labs Lab 06/12/14 0233  LABPROT 12.9  INR 0.96   Urine Drug Screen: Drugs of Abuse     Component Value Date/Time   LABOPIA NONE DETECTED 06/12/2014 0247   COCAINSCRNUR POSITIVE* 06/12/2014 0247   COCAINSCRNUR POS* 07/30/2009 1818   LABBENZ NONE DETECTED 06/12/2014 0247   LABBENZ NEG 07/30/2009 1818   AMPHETMU NONE DETECTED 06/12/2014 0247   AMPHETMU NEG 07/30/2009 1818   THCU NONE DETECTED 06/12/2014 0247   LABBARB NONE DETECTED 06/12/2014 0247    Alcohol Level:  Recent Labs Lab 06/12/14 0233  ETH <11   Urinalysis:  Recent Labs Lab 06/12/14 0247  COLORURINE YELLOW  LABSPEC 1.022  PHURINE 5.0  GLUCOSEU NEGATIVE  HGBUR NEGATIVE  BILIRUBINUR NEGATIVE  KETONESUR NEGATIVE  PROTEINUR NEGATIVE  UROBILINOGEN 0.2  NITRITE NEGATIVE  LEUKOCYTESUR NEGATIVE    Micro Results: No results found for this or any previous visit (from the past 240 hour(s)).   Studies/Results: Ct Head Wo Contrast  06/12/2014   CLINICAL DATA:  LEFT-sided weakness for 2 days, tongue swelling and numbness. Dizziness, headache and nausea.  EXAM: CT HEAD WITHOUT CONTRAST  TECHNIQUE: Contiguous axial images were obtained from the base of the skull  through the vertex without intravenous contrast.  COMPARISON:  CT of the head March 29, 2013  FINDINGS: The ventricles and sulci are normal. No intraparenchymal hemorrhage, mass effect nor midline shift. No acute large vascular territory infarcts.  No abnormal extra-axial fluid collections. Basal cisterns are patent. Mild calcific atherosclerosis.  No skull fracture. The included ocular globes and orbital contents are non-suspicious. Stable proptosis, resolved retrobulbar hematoma. The mastoid aircells and included paranasal sinuses are well-aerated.  IMPRESSION: No acute intracranial process. Stable  appearance of the head from March 29, 2013.   Electronically Signed   By: Elon Alas   On: 06/12/2014 03:44   Medications: I have reviewed the patient's current medications. Scheduled Meds: .  stroke: mapping our early stages of recovery book   Does not apply Once  . atorvastatin  40 mg Oral q1800  . ferrous sulfate  325 mg Oral TID  . gabapentin  300 mg Oral TID  . pantoprazole  40 mg Oral Daily   Continuous Infusions:  PRN Meds:. Assessment/Plan: Principal Problem:   TIA (transient ischemic attack) Active Problems:   Hyperlipemia   COCAINE ABUSE, EPISODIC   TOBACCO ABUSE   Essential hypertension, benign   Cervical radiculitis  TIA vs left focal seizure without impairment of consciousness vs demyelinating d/o: CT Head negative for acute stroke. +cocaine on UDS. She reports she last used cocaine about 4 days ago. Neuro following.  - MRI brain pending - EEG pending - Cardiac monitoring - PT/OT  Cervical Radiculitis: MRI Cervical Spine on 04/07/14 showed foraminal narrowing on the left could possibly affect the C7 nerve root. Patient notes she has baseline tingling in her left arm down to her fingers. She takes Gabapentin 300 mg TID and Norco 5-325 mg "very rarely" if she has pain.  - Continue Gabapentin 300 mg TID - Hold Norco  HTN: BP 171/95 on admission. Patient takes Lisinopril-HCTZ 20-25 mg daily at home per records. - Stop Lisinopril-HCTZ - Allow for permissive HTN given possible TIA   HLD: Last lipid profile on 04/23/14 shows Chol 210, Trigly 188, HDL 47, and LDL 125. Patient used to be on Pravastatin 40 mg daily, but stopped taking this medication due to insurance issues. Patient now has insurance so will restart statin.  - Start Lipitor 40 mg daily   Tobacco Abuse: Patient reports she is down to 1 cigarette per day. - Continue to encourage smoking cessation   Diet: Heart healthy diet VTE PPx: SCDs  Dispo: Disposition is deferred at this time, awaiting  improvement of current medical problems. Anticipated discharge in approximately 1-2 day(s).   The patient does have a current PCP Bartholomew Crews, MD) and does need an [facility] hospital follow-up appointment after discharge.  The patient does not have transportation limitations that hinder transportation to clinic appointments. .Services Needed at time of discharge: Y = Yes, Blank = No PT:   OT:   RN:   Equipment:   Other:     LOS: 0 days   Jacques Earthly, MD 06/12/2014, 9:40 AM

## 2014-06-12 NOTE — ED Provider Notes (Signed)
CSN: [id number]     Arrival date & time 06/12/14  0225 History  This chart was scribed for Ruth Rice, MD by Chester Holstein, ED Scribe. This patient was seen in room A04C/A04C and the patient's care was started at 2:35 AM.    Chief Complaint  Patient presents with  . Dizziness  . Headache  . Facial Droop     (Consider location/radiation/quality/duration/timing/severity/associated sxs/prior Treatment) HPI  HPI Comments: [name] is a 53 y.o. female who presents to the Emergency Department complaining of intermittent numbness on the left side of her body with onset 4 days ago. Pt states the symptoms worsened this evening, waking her from her sleep. Pt notes associated blurred vision in her left eye which is currently resolved and nausea. She notes when she is having an episode she is unable to talk or move. Pt states she was seen in the ED 2 weeks ago for abdominal cramping and spasms. She notes she started a new blood pressure medication one month ago.  She denies weakness.    Past Medical History  Diagnosis Date  . Hyperlipidemia   . Hiatal hernia   . GERD (gastroesophageal reflux disease)   . Hypertension   . Anemia   . Arthritis   . Plantar fasciitis    Past Surgical History  Procedure Laterality Date  . Abdominal hysterectomy  12/09    Secondary to fibroids  . Cesarean section     Family History  Problem Relation Age of Onset  . Cancer Mother 44    pancreatic cancer  . Pancreatic cancer Mother   . Cancer Father 53    throat cancer  . Cancer Maternal Aunt 60    breast cancer  . Cancer Other     Died  from Colon cancer in 15's  . Colon cancer Maternal Uncle   . Colon cancer Paternal Uncle    History  Substance Use Topics  . Smoking status: Current Every Day Smoker -- 0.10 packs/day    Types: Cigarettes  . Smokeless tobacco: Never Used     Comment: Down to 1-2 cig/day.  . Alcohol Use: Yes     Comment: occasionally   OB History    No data  available     Review of Systems  Constitutional: Negative for fever and chills.  Eyes: Positive for visual disturbance (resolved).  Respiratory: Negative for shortness of breath.   Cardiovascular: Negative for chest pain.  Gastrointestinal: Positive for nausea. Negative for vomiting, abdominal pain and diarrhea.  Musculoskeletal: Negative for back pain, neck pain and neck stiffness.  Skin: Negative for rash and wound.  Neurological: Positive for numbness. Negative for dizziness and weakness.  All other systems reviewed and are negative.     Allergies  Aspirin and Hydromorphone hcl  Home Medications   Prior to Admission medications   Medication Sig Start Date End Date Taking? Authorizing Provider  ferrous sulfate (IRON SUPPLEMENT) 325 (65 FE) MG tablet Take 325 mg by mouth 3 (three) times daily.    Yes Historical Provider, MD  HYDROcodone-acetaminophen (NORCO/VICODIN) 5-325 MG per tablet Take 1 tablet by mouth every 6 (six) hours as needed. 05/02/14  Yes Harvie Heck, PA-C  lisinopril-hydrochlorothiazide (PRINZIDE,ZESTORETIC) 20-25 MG per tablet Take 1 tablet by mouth daily. 05/13/14  Yes Bartholomew Crews, MD  omeprazole (PRILOSEC) 20 MG capsule Take 1 capsule (20 mg total) by mouth daily. 05/19/13  Yes Bartholomew Crews, MD  atorvastatin (LIPITOR) 40 MG tablet Take 1 tablet (40 mg  total) by mouth daily at 6 PM. 06/14/14   Jacques Earthly, MD  gabapentin (NEURONTIN) 300 MG capsule Take 2 capsules (600 mg total) by mouth 3 (three) times daily. 06/14/14   Jacques Earthly, MD  ibuprofen (ADVIL,MOTRIN) 800 MG tablet Take 1 tablet (800 mg total) by mouth every 8 (eight) hours as needed for headache or moderate pain. 06/14/14   Jacques Earthly, MD  oxyCODONE-acetaminophen (PERCOCET) 5-325 MG per tablet Take 1 tablet by mouth at bedtime as needed for severe pain. 06/17/14   Karren Cobble, MD   BP 133/87 mmHg  Pulse 80  Temp(Src) 98.1 F (36.7 C) (Oral)  Resp 20  Ht 4\' 11"  (1.499 m)   Wt 146 lb 9.6 oz (66.497 kg)  BMI 29.59 kg/m2  SpO2 100% Physical Exam  Constitutional: She is oriented to person, place, and time. She appears well-developed and well-nourished. No distress.  HENT:  Head: Normocephalic and atraumatic.  Mouth/Throat: Oropharynx is clear and moist. No oropharyngeal exudate.  Subjective decreased sensation to light touch to the left side of the face both upper and lower face.  Eyes: Conjunctivae and EOM are normal. Pupils are equal, round, and reactive to light.  Neck: Normal range of motion. Neck supple.  No meningismus  Cardiovascular: Normal rate and regular rhythm.   Pulmonary/Chest: Effort normal and breath sounds normal. No respiratory distress. She has no wheezes. She has no rales. She exhibits no tenderness.  Abdominal: Soft. Bowel sounds are normal. She exhibits no distension and no mass. There is no tenderness. There is no rebound and no guarding.  Musculoskeletal: Normal range of motion. She exhibits no edema or tenderness.  Neurological: She is alert and oriented to person, place, and time.  No slurred speech. Alert and oriented. 5/5 motor in all extremities. Subjectively decreased sensation to light touch in the left upper and left lower extremities.  Skin: Skin is warm and dry. No rash noted. No erythema.  Psychiatric: She has a normal mood and affect. Her behavior is normal.  Nursing note and vitals reviewed.   ED Course  Procedures (including critical care time) DIAGNOSTIC STUDIES: Oxygen Saturation is 100% on room air, normal by my interpretation.    COORDINATION OF CARE: 2:40 AM Discussed treatment plan with patient at beside, the patient agrees with the plan and has no further questions at this time.   Labs Review Labs Reviewed  CBC - Abnormal; Notable for the following:    Hemoglobin 10.3 (*)    HCT 32.3 (*)    MCV 71.3 (*)    MCH 22.7 (*)    All other components within normal limits  DIFFERENTIAL - Abnormal; Notable for the  following:    Lymphocytes Relative 48 (*)    All other components within normal limits  COMPREHENSIVE METABOLIC PANEL - Abnormal; Notable for the following:    Total Bilirubin <0.2 (*)    All other components within normal limits  URINE RAPID DRUG SCREEN (HOSP PERFORMED) - Abnormal; Notable for the following:    Cocaine POSITIVE (*)    All other components within normal limits  HEMOGLOBIN A1C - Abnormal; Notable for the following:    Hgb A1c MFr Bld 5.8 (*)    Mean Plasma Glucose 120 (*)    All other components within normal limits  GLUCOSE, CAPILLARY - Abnormal; Notable for the following:    Glucose-Capillary 104 (*)    All other components within normal limits  GLUCOSE, CAPILLARY - Abnormal; Notable for the following:  Glucose-Capillary 113 (*)    All other components within normal limits  CBC - Abnormal; Notable for the following:    Hemoglobin 9.4 (*)    HCT 31.0 (*)    MCV 70.9 (*)    MCH 21.5 (*)    All other components within normal limits  BASIC METABOLIC PANEL - Abnormal; Notable for the following:    GFR calc non Af Amer 83 (*)    All other components within normal limits  GLUCOSE, CAPILLARY - Abnormal; Notable for the following:    Glucose-Capillary 113 (*)    All other components within normal limits  GLUCOSE, CAPILLARY - Abnormal; Notable for the following:    Glucose-Capillary 121 (*)    All other components within normal limits  GLUCOSE, CAPILLARY - Abnormal; Notable for the following:    Glucose-Capillary 132 (*)    All other components within normal limits  CBC - Abnormal; Notable for the following:    Hemoglobin 9.5 (*)    HCT 31.0 (*)    MCV 73.1 (*)    MCH 22.4 (*)    All other components within normal limits  BASIC METABOLIC PANEL - Abnormal; Notable for the following:    Glucose, Bld 107 (*)    All other components within normal limits  GLUCOSE, CAPILLARY - Abnormal; Notable for the following:    Glucose-Capillary 144 (*)    All other components  within normal limits  GLUCOSE, CAPILLARY - Abnormal; Notable for the following:    Glucose-Capillary 138 (*)    All other components within normal limits  ETHANOL  PROTIME-INR  APTT  URINALYSIS, ROUTINE W REFLEX MICROSCOPIC  TSH  GLUCOSE, CAPILLARY  GLUCOSE, CAPILLARY  I-STAT TROPOININ, ED    Imaging Review No results found.   EKG Interpretation   Date/Time:  Friday June 12 2014 02:31:27 EST Ventricular Rate:  91 PR Interval:  155 QRS Duration: 91 QT Interval:  372 QTC Calculation: 458 R Axis:   96 Text Interpretation:  Sinus rhythm Borderline right axis deviation ED  PHYSICIAN INTERPRETATION AVAILABLE IN [facility] Confirmed by TEST,  Record ([id number]) on 06/14/2014 7:31:28 AM      Date: 06/12/2014  Rate: 91  Rhythm: normal sinus rhythm  QRS Axis: normal  Intervals: normal  ST/T Wave abnormalities: normal  Conduction Disutrbances:none  Narrative Interpretation:   Old EKG Reviewed: none available   MDM   Final diagnoses:  Numbness    I personally performed the services described in this documentation, which was scribed in my presence. The recorded information has been reviewed and is accurate.  Patient with no acute finding on CT head. Urine drug screen positive for cocaine. Discussed with neurology and will evaluate patient also discussed with teaching service and will admit patient for further workup.    Ruth Rice, MD 06/18/14 [phone]

## 2014-06-12 NOTE — H&P (Signed)
Date: 06/12/2014               Patient Name:  [name] MRN: [id number]  DOB: August 02, 1960 Age / Sex: 53 y.o., female   PCP: Bartholomew Crews, MD         Medical Service: Internal Medicine Teaching Service         Attending Physician: Dr. Aldine Contes    First Contact: Dr. Jacques Earthly Pager: [phone]  Second Contact: Dr. Duwaine Maxin Pager: [phone]       After Hours (After 5p/  First Contact Pager: [phone]  weekends / holidays): Second Contact Pager: [phone]   Chief Complaint: Numbness on left side of body  History of Present Illness: [name] is a 53yo woman w/ PMHx of HTN, HLD, GERD, anemia, and cervical radiculitis who presents to the ED complaining of numbness on the left side of her body. Patient states her "attacks" started 4 days ago where she has numbness over the entire left side of the body, blurry vision in her left eye, and she is unable to talk or move. She states she feels that her tongue "swells up" and that she is unable to talk. She states the attacks last approximately 5 minutes and then resolve. She reports approximately 10 attacks over the last 4 days. She states her attacks are worsening as the attack this morning awoke her from sleep. She reports associated dizziness, confusion, and headache. She denies weakness, diplopia, dysphagia, and syncope. She states with her cervical radiculitis she has tingling in her left arm down to her fingers at baseline, but these attacks are much different.   In the ED, CT Head was negative for acute stroke.   Meds: No current facility-administered medications for this encounter.   Current Outpatient Prescriptions  Medication Sig Dispense Refill  . ferrous sulfate (IRON SUPPLEMENT) 325 (65 FE) MG tablet Take 325 mg by mouth 3 (three) times daily.     Marland Kitchen gabapentin (NEURONTIN) 300 MG capsule Take 1 capsule (300 mg total) by mouth 3 (three) times daily. 90 capsule 1  . HYDROcodone-acetaminophen (NORCO/VICODIN)  5-325 MG per tablet Take 1 tablet by mouth every 6 (six) hours as needed. 10 tablet 0  . ibuprofen (ADVIL,MOTRIN) 800 MG tablet Take 1 tablet (800 mg total) by mouth 3 (three) times daily. 21 tablet 0  . lisinopril-hydrochlorothiazide (PRINZIDE,ZESTORETIC) 20-25 MG per tablet Take 1 tablet by mouth daily. 30 tablet 1  . LORazepam (ATIVAN) 1 MG tablet Take 1 tablet (1 mg total) by mouth every 6 (six) hours as needed (spasms). 4 tablet 0  . metoCLOPramide (REGLAN) 10 MG tablet Take 1 tablet (10 mg total) by mouth every 6 (six) hours as needed for nausea (nausea/headache). 6 tablet 0  . omeprazole (PRILOSEC) 20 MG capsule Take 1 capsule (20 mg total) by mouth daily. 30 capsule 4  . oxyCODONE-acetaminophen (PERCOCET/ROXICET) 5-325 MG per tablet Take 1 tablet by mouth at bedtime as needed for severe pain. 30 tablet 0  . cephALEXin (KEFLEX) 500 MG capsule 2 caps po bid x 3 days (Patient not taking: Reported on 06/12/2014) 12 capsule 0  . lisinopril (PRINIVIL,ZESTRIL) 20 MG tablet Take 1 tablet (20 mg total) by mouth daily. (Patient not taking: Reported on 05/17/2014) 90 tablet 11  . loperamide (IMODIUM) 2 MG capsule Take two tabs po initially, then one tab after each loose stool: max 8 tabs in 24 hours (Patient not taking: Reported on 06/12/2014) 12 capsule 0  . ondansetron (ZOFRAN ODT)  8 MG disintegrating tablet 8mg  ODT q4 hours prn nausea (Patient not taking: Reported on 06/12/2014) 4 tablet 0  . [DISCONTINUED] dicyclomine (BENTYL) 20 MG tablet Take 1 tablet (20 mg total) by mouth 2 (two) times daily. (Patient not taking: Reported on 05/17/2014) 20 tablet 0    Allergies: Allergies as of 06/12/2014 - Review Complete 06/12/2014  Allergen Reaction Noted  . Aspirin Hives   . Hydromorphone hcl Hives and Nausea And Vomiting    Past Medical History  Diagnosis Date  . Hyperlipidemia   . Hiatal hernia   . GERD (gastroesophageal reflux disease)   . Hypertension   . Anemia   . Arthritis   . Plantar  fasciitis    Past Surgical History  Procedure Laterality Date  . Abdominal hysterectomy  12/09    Secondary to fibroids  . Cesarean section     Family History  Problem Relation Age of Onset  . Cancer Mother 59    pancreatic cancer  . Pancreatic cancer Mother   . Cancer Father 32    throat cancer  . Cancer Maternal Aunt 60    breast cancer  . Cancer Other     Died  from Colon cancer in 39's  . Colon cancer Maternal Uncle   . Colon cancer Paternal Uncle    History   Social History  . Marital Status: Divorced    Spouse Name: N/A    Number of Children: N/A  . Years of Education: N/A   Occupational History  . unemployed     used to work in a [employer]   Social History Main Topics  . Smoking status: Current Every Day Smoker -- 0.10 packs/day    Types: Cigarettes  . Smokeless tobacco: Never Used     Comment: Down to 1-2 cig/day.  . Alcohol Use: Yes     Comment: occasionally  . Drug Use: No  . Sexual Activity: Not on file   Other Topics Concern  . Not on file   Social History Narrative   10 th grade education.    Review of Systems: All pertinent ROS in HPI  Physical Exam: Blood pressure 141/72, pulse 78, temperature 97.9 F (36.6 C), temperature source Oral, resp. rate 14, height 4\' 11"  (1.499 m), weight 71.668 kg (158 lb), SpO2 100 %. General: alert, sitting up in bed, NAD HEENT: [place]/AT, EOMI, exophthalmos present, mucus membranes moist CV: RRR, normal S1/S2, no m/g/r Pulm: CTA bilaterally, breaths non-labored Abd: BS+, soft, non-tender Ext: warm, no edema, moves all Neuro: alert and oriented x 3. No aphasia. PERRL, EOMI, visual fields intact, peripheral vision intact. Smile symmetric. Shoulder shrug intact. Able to stick out tongue, no deviation. Decreased sensation to light touch on left side of face, left upper extremity, and left lower extremity compared to the right side. Reflexes slightly more increased in both left upper and lower extremity compared to  right. Hand grip decreased on left. Strength symmetric (5/5) in lower extremities.   Lab results: Basic Metabolic Panel:  Recent Labs  06/12/14 0233  NA 142  K 4.1  CL 103  CO2 25  GLUCOSE 93  BUN 17  CREATININE 0.76  CALCIUM 9.4   Liver Function Tests:  Recent Labs  06/12/14 0233  AST PENDING  ALT 25  ALKPHOS 90  BILITOT <0.2*  PROT 7.1  ALBUMIN 3.6   No results for input(s): LIPASE, AMYLASE in the last 72 hours. No results for input(s): AMMONIA in the last 72 hours. CBC:  Recent  Labs  06/12/14 0233  WBC 7.3  NEUTROABS 3.2  HGB 10.3*  HCT 32.3*  MCV 71.3*  PLT 290   Cardiac Enzymes: No results for input(s): CKTOTAL, CKMB, CKMBINDEX, TROPONINI in the last 72 hours. BNP: No results for input(s): PROBNP in the last 72 hours. D-Dimer: No results for input(s): DDIMER in the last 72 hours. CBG: No results for input(s): GLUCAP in the last 72 hours. Hemoglobin A1C: No results for input(s): HGBA1C in the last 72 hours. Fasting Lipid Panel: No results for input(s): CHOL, HDL, LDLCALC, TRIG, CHOLHDL, LDLDIRECT in the last 72 hours. Thyroid Function Tests: No results for input(s): TSH, T4TOTAL, FREET4, T3FREE, THYROIDAB in the last 72 hours. Anemia Panel: No results for input(s): VITAMINB12, FOLATE, FERRITIN, TIBC, IRON, RETICCTPCT in the last 72 hours. Coagulation:  Recent Labs  06/12/14 0233  LABPROT 12.9  INR 0.96   Urine Drug Screen: Drugs of Abuse     Component Value Date/Time   LABOPIA NONE DETECTED 06/12/2014 0247   COCAINSCRNUR POSITIVE* 06/12/2014 0247   COCAINSCRNUR POS* 07/30/2009 1818   LABBENZ NONE DETECTED 06/12/2014 0247   LABBENZ NEG 07/30/2009 1818   AMPHETMU NONE DETECTED 06/12/2014 0247   AMPHETMU NEG 07/30/2009 1818   THCU NONE DETECTED 06/12/2014 0247   LABBARB NONE DETECTED 06/12/2014 0247    Alcohol Level:  Recent Labs  06/12/14 0233  ETH <11   Urinalysis:  Recent Labs  06/12/14 0247  COLORURINE YELLOW  LABSPEC  1.022  PHURINE 5.0  GLUCOSEU NEGATIVE  HGBUR NEGATIVE  BILIRUBINUR NEGATIVE  KETONESUR NEGATIVE  PROTEINUR NEGATIVE  UROBILINOGEN 0.2  NITRITE NEGATIVE  LEUKOCYTESUR NEGATIVE    Imaging results:  Ct Head Wo Contrast  06/12/2014   CLINICAL DATA:  LEFT-sided weakness for 2 days, tongue swelling and numbness. Dizziness, headache and nausea.  EXAM: CT HEAD WITHOUT CONTRAST  TECHNIQUE: Contiguous axial images were obtained from the base of the skull through the vertex without intravenous contrast.  COMPARISON:  CT of the head March 29, 2013  FINDINGS: The ventricles and sulci are normal. No intraparenchymal hemorrhage, mass effect nor midline shift. No acute large vascular territory infarcts.  No abnormal extra-axial fluid collections. Basal cisterns are patent. Mild calcific atherosclerosis.  No skull fracture. The included ocular globes and orbital contents are non-suspicious. Stable proptosis, resolved retrobulbar hematoma. The mastoid aircells and included paranasal sinuses are well-aerated.  IMPRESSION: No acute intracranial process. Stable appearance of the head from March 29, 2013.   Electronically Signed   By: Elon Alas   On: 06/12/2014 03:44    Other results: EKG: sinus rhythm   Assessment & Plan by Problem: Principal Problem:   TIA (transient ischemic attack) Active Problems:   Hyperlipemia   COCAINE ABUSE, EPISODIC   TOBACCO ABUSE   Essential hypertension, benign   Cervical radiculitis Ms. Sainato is a 53yo woman w/ PMHx of HTN, HLD, GERD, anemia, and cervical radiculitis who presents with 4 day history of intermittent episodes of numbness on the left side of her body, left-sided blurry vision, and inability to move or talk.  Likely TIA: Patient presents with 4 day history of intermittent episodes of numbness on the entire left side of the body, left-sided blurry vision, and inability to move or talk. She reports ~ 10 episodes over last 4 days. Exam significant for  sensation deficits in her left extremities and increased reflexes. CT Head negative for acute stroke. Patient did have +cocaine on UDS. She reports she last used cocaine about 4 days  ago. Possible that her cocaine use is causing vasospasms. Will do full work up for stroke and get MRI imaging given it has better sensitivity.  - Neurology consulted, appreciate recommendations - MRI Brain - 2D Echocardiogram - Carotid dopplers - HbA1c - Lipid panel recently done so will not reorder - Cardiac monitoring - PT/OT - Swallow eval - Neuro checks Q2H  Cervical Radiculitis: MRI Cervical Spine on 04/07/14 showed foraminal narrowing on the left could possibly affect the C7 nerve root. Patient notes she has baseline tingling in her left arm down to her fingers. She takes Gabapentin 300 mg TID and Norco 5-325 mg "very rarely" if she has pain.  - Continue Gabapentin 300 mg TID - Hold Norco  HTN: BP 171/95 on admission. Patient takes Lisinopril-HCTZ 20-25 mg daily at home per records. - Stop Lisinopril-HCTZ - Allow for permissive HTN given possible stroke   HLD: Last lipid profile on 04/23/14 shows Chol 210, Trigly 188, HDL 47, and LDL 125. Patient used to be on Pravastatin 40 mg daily, but stopped taking this medication due to insurance issues. Patient now has insurance so will restart statin.  - Start Lipitor 40 mg daily   Tobacco Abuse: Patient reports she is down to 1 cigarette per day. I congratulated her and encouraged her to continue her goal of quitting completely.  - Continue to encourage smoking cessation   Diet: NPO for now, pending swallow eval VTE PPx: SCDs Dispo: Disposition is deferred at this time, awaiting improvement of current medical problems. Anticipated discharge in approximately 1-2 day(s).   The patient does have a current PCP Bartholomew Crews, MD) and does need an [facility] hospital follow-up appointment after discharge.  The patient does not have transportation limitations that  hinder transportation to clinic appointments.  Signed: Albin Felling, MD 06/12/2014, 4:07 AM

## 2014-06-12 NOTE — ED Notes (Signed)
Patient here with complaint of facial droop, headache, dizziness, nausea. States symptoms began 2 days ago.

## 2014-06-12 NOTE — Evaluation (Signed)
Physical Therapy Evaluation Patient Details Name: [name] MRN: [id number] DOB: 1961-05-01 Today's Date: 06/12/2014   History of Present Illness  53 yo female admitted due to facial droop, HA, dizziness, [place] starting on 06/10/14. L eye blurry vision and weakness on L side of the body. CT (-) pending MRI and further workup PMH: HTN, HLD< GERD, anemia, cervical radiculopathy with mulitple episodes of numbness on L side of the body    Clinical Impression  Pt with grossly symmetrical bilateral LE strength, stating that she is moving similar to baseline status and is not noticing any strength deficits remaining from TIA. Pt with 9/10 headache throughout session and no complaints of dizziness until getting out into hall during ambulation. At this time, pt required increased assist to min assist to ambulate back into room and get into bed. Pt had BP 119/80 after ambulation while sitting EOB. Pt stated " this feels like one of my episodes". PT spoke with nurse about pt's performance and her statement regarding her symptoms. Attempt ambulation with RW next visit to assess safety based on pt's change in assistance during ambulation today. Pt will benefit from acute PT to improve safety with balance and ambulation.     Follow Up Recommendations No PT follow up    Equipment Recommendations  None recommended by PT    Recommendations for Other Services       Precautions / Restrictions Precautions Precautions: Fall Restrictions Weight Bearing Restrictions: No      Mobility  Bed Mobility Overal bed mobility: Needs Assistance Bed Mobility: Supine to Sit;Sit to Supine     Supine to sit: Supervision Sit to supine: Supervision   General bed mobility comments: Pt with some cuing for getting out of bed but was able to perform without physical assistance from PT. Used rail to assist getting up to sitting.   Transfers Overall transfer level: Needs assistance Equipment used:  None Transfers: Sit to/from Stand Sit to Stand: Supervision         General transfer comment: Pt able to stand without cuing or assistance from PT. Pt safe once achieving standing.   Ambulation/Gait Ambulation/Gait assistance: Min guard;Min assist Ambulation Distance (Feet): 35 Feet Assistive device: None;1 Bauer hand held assist Gait Pattern/deviations: Step-through pattern;Decreased stride length   Gait velocity interpretation: Below normal speed for age/gender General Gait Details: Pt initially ambulating with min guard for safety using no support without loss of balance. Once in hallway, pt complained of slight dizziness. Pt required PT handhold and min assist to ambulate back into room. Pt without loss of balance or leg buckling but stated when she was walking back into room "my left leg doesn't want to move but it keeps up". Pt had no obvious gait abnormalities on the L side.   Stairs            Wheelchair Mobility    Modified Rankin (Stroke Patients Only)       Balance Overall balance assessment: Needs assistance         Standing balance support: During functional activity;No upper extremity supported Standing balance-Leahy Scale: Fair Standing balance comment: Pt able to maintain static balance and ambulate with min guard.                              Pertinent Vitals/Pain Pain Assessment: 0-10 Pain Score: 9  Pain Location: Headache Pain Descriptors / Indicators: Pressure Pain Intervention(s): Limited activity within patient's tolerance;Monitored during  session    Home Living Family/patient expects to be discharged to:: Private residence Living Arrangements: Other (Comment) (indicated gentleman in the room who she did not identify her relationship to) Available Help at Discharge: Friend(s) Type of Home: House Home Access: Stairs to enter Entrance Stairs-Rails: None Entrance Stairs-Number of Steps: 3 Home Layout: One level Home Equipment:  None      Prior Function Level of Independence: Independent               Hand Dominance        Extremity/Trunk Assessment               Lower Extremity Assessment: Overall WFL for tasks assessed         Communication   Communication: No difficulties  Cognition Arousal/Alertness: Awake/alert Behavior During Therapy: WFL for tasks assessed/performed Overall Cognitive Status: Within Functional Limits for tasks assessed                      General Comments      Exercises        Assessment/Plan    PT Assessment Patient needs continued PT services  PT Diagnosis Difficulty walking   PT Problem List Decreased strength;Decreased activity tolerance;Decreased balance;Decreased mobility;Decreased knowledge of use of DME  PT Treatment Interventions DME instruction;Stair training;Gait training;Functional mobility training;Therapeutic activities;Therapeutic exercise;Balance training;Patient/family education   PT Goals (Current goals can be found in the Care Plan section) Acute Rehab PT Goals Patient Stated Goal: None stated PT Goal Formulation: With patient Time For Goal Achievement: 06/26/14 Potential to Achieve Goals: Good    Frequency Min 4X/week   Barriers to discharge        Co-evaluation               End of Session Equipment Utilized During Treatment: Gait belt Activity Tolerance: Patient limited by pain Patient left: in bed;with call bell/phone within reach;with family/visitor present Nurse Communication: Other (pt's ambulation status; pt's statement about how she feels during her "episodes")    Functional Assessment Tool Used: clinical jugment Functional Limitation: Mobility: Walking and moving around Mobility: Walking and Moving Around Current Status (E2800): At least 20 percent but less than 40 percent impaired, limited or restricted Mobility: Walking and Moving Around Goal Status [phone]): At least 1 percent but less than 20  percent impaired, limited or restricted    Time: 1401-1420 PT Time Calculation (min) (ACUTE ONLY): 19 min   Charges:   PT Evaluation $Initial PT Evaluation Tier I: 1 Procedure PT Treatments $Gait Training: 8-22 mins   PT G Codes:   Functional Assessment Tool Used: clinical jugment Functional Limitation: Mobility: Walking and moving around    [place], Minette Brine SPT 06/12/2014, 2:53 PM  [facility] Shines, [place]  [facility] [phone]

## 2014-06-12 NOTE — ED Notes (Signed)
Neurology at the bedside

## 2014-06-12 NOTE — ED Notes (Signed)
Called Dr. Lita Mains, he gives verbal order for 0.5mg  of ativan IV for patient's anxiety prior to CT

## 2014-06-12 NOTE — Consult Note (Signed)
NEURO HOSPITALIST CONSULT NOTE    Reason for Consult: episodic left sided paresthesias with left leg jerkiness and inability to talk.  HPI:                                                                                                                                          [name] is an 53 y.o. female with a past medical history significant for HTN, hyperlipidemia, GERD, and cervical radiculopathy, comes in for further evaluation of the above stated symptoms. She indicated that for the past 4 days she has been experiencing daily, intermittent episodes of sudden onset of numbness-tingling of the left face-arm-leg associated with a " swollen tongue sensation that doesn't allow me to talk during the episode, and then the leg leg moves uncontrollably and I can not walk". The episodes last no more than 5 minutes but occur 3 or 4 times every day. She is fully aware of the environment and there is not concomitant HA, true vertigo, double vision, focal weakness, slurred speech, language or vision impairment. [name] said that " it got worse tonight and I got scared'. She never had similar symptoms before. No recent fever, head or neck trauma. CT brain showed no acute abnormality.   Past Medical History  Diagnosis Date  . Hyperlipidemia   . Hiatal hernia   . GERD (gastroesophageal reflux disease)   . Hypertension   . Anemia   . Arthritis   . Plantar fasciitis     Past Surgical History  Procedure Laterality Date  . Abdominal hysterectomy  12/09    Secondary to fibroids  . Cesarean section      Family History  Problem Relation Age of Onset  . Cancer Mother 59    pancreatic cancer  . Pancreatic cancer Mother   . Cancer Father 67    throat cancer  . Cancer Maternal Aunt 60    breast cancer  . Cancer Other     Died  from Colon cancer in 35's  . Colon cancer Maternal Uncle   . Colon cancer Paternal Uncle     Family History: as above.   Social  History:  reports that she has been smoking Cigarettes.  She has been smoking about 0.10 packs per day. She has never used smokeless tobacco. She reports that she drinks alcohol. She reports that she does not use illicit drugs.  Allergies  Allergen Reactions  . Aspirin Hives  . Hydromorphone Hcl Hives and Nausea And Vomiting    MEDICATIONS:  I have reviewed the patient's current medications.   ROS:                                                                                                                                       History obtained from the patient  General ROS: negative for - chills, fatigue, fever, night sweats, weight gain or weight loss Psychological ROS: negative for - behavioral disorder, hallucinations, memory difficulties, mood swings or suicidal ideation Ophthalmic ROS: negative for - blurry vision, double vision, eye pain or loss of vision ENT ROS: negative for - epistaxis, nasal discharge, oral lesions, sore throat, tinnitus or vertigo Allergy and Immunology ROS: negative for - hives or itchy/watery eyes Hematological and Lymphatic ROS: negative for - bleeding problems, bruising or swollen lymph nodes Endocrine ROS: negative for - galactorrhea, hair pattern changes, polydipsia/polyuria or temperature intolerance Respiratory ROS: negative for - cough, hemoptysis, shortness of breath or wheezing Cardiovascular ROS: negative for - chest pain, dyspnea on exertion, edema or irregular heartbeat Gastrointestinal ROS: negative for - abdominal pain, diarrhea, hematemesis, nausea/vomiting or stool incontinence Genito-Urinary ROS: negative for - dysuria, hematuria, incontinence or urinary frequency/urgency Musculoskeletal ROS: negative for - joint swelling or muscular weakness Neurological ROS: as noted in HPI Dermatological ROS: negative for rash and skin  lesion changes    Physical exam: pleasant female in no apparent distress. Blood pressure 131/79, pulse 80, temperature 97.9 F (36.6 C), temperature source Oral, resp. rate 16, height _0  (1.499 m), weight 71.668 kg (158 lb), SpO2 100 %. Head: normocephalic. Neck: supple, no bruits, no JVD. Cardiac: no murmurs. Lungs: clear. Abdomen: soft, no tender, no mass. Extremities: no edema. Neurologic Examination:                                                                                                      General: Mental Status: Alert, oriented, thought content appropriate.  Speech fluent without evidence of aphasia.  Able to follow 3 step commands without difficulty. Cranial Nerves: II: Discs flat bilaterally; Visual fields grossly normal, pupils equal, round, reactive to light and accommodation III,IV, VI: exophthalmos, ptosis not present, extra-ocular motions intact bilaterally V,VII: smile symmetric, facial light touch sensation normal bilaterally VIII: hearing normal bilaterally IX,X: gag reflex present XI: bilateral shoulder shrug XII: midline tongue extension without atrophy or fasciculations Motor: Right : Upper extremity   5/5    Left:     Upper extremity   5/5  Lower extremity   5/5  Lower extremity   5/5 Tone and bulk:normal tone throughout; no atrophy noted Sensory: Pinprick and light touch intact throughout, bilaterally Deep Tendon Reflexes:  Right: Upper Extremity   Left: Upper extremity   biceps (C-5 to C-6) 2/4   biceps (C-5 to C-6) 2/4 tricep (C7) 2/4    triceps (C7) 2/4 Brachioradialis (C6) 2/4  Brachioradialis (C6) 2/4  Lower Extremity Lower Extremity  quadriceps (L-2 to L-4) 2/4   quadriceps (L-2 to L-4) 2/4 Achilles (S1) 2/4   Achilles (S1) 2/4  Plantars: Right: downgoing   Left: downgoing Cerebellar: normal finger-to-nose,  normal heel-to-shin test Gait:  No tested    Lab Results  Component Value Date/Time   CHOL 210* 04/23/2014 09:10 AM     Results for orders placed or performed during the hospital encounter of 06/12/14 (from the past 48 hour(s))  Ethanol     Status: None   Collection Time: 06/12/14  2:33 AM  Result Value Ref Range   Alcohol, Ethyl (B) <11 0 - 11 mg/dL    Comment:        LOWEST DETECTABLE LIMIT FOR SERUM ALCOHOL IS 11 mg/dL FOR MEDICAL PURPOSES ONLY   Protime-INR     Status: None   Collection Time: 06/12/14  2:33 AM  Result Value Ref Range   Prothrombin Time 12.9 11.6 - 15.2 seconds   INR 0.96 0.00 - 1.49  APTT     Status: None   Collection Time: 06/12/14  2:33 AM  Result Value Ref Range   aPTT 30 24 - 37 seconds  CBC     Status: Abnormal   Collection Time: 06/12/14  2:33 AM  Result Value Ref Range   WBC 7.3 4.0 - 10.5 K/uL   RBC 4.53 3.87 - 5.11 MIL/uL   Hemoglobin 10.3 (L) 12.0 - 15.0 g/dL   HCT 32.3 (L) 36.0 - 46.0 %   MCV 71.3 (L) 78.0 - 100.0 fL   MCH 22.7 (L) 26.0 - 34.0 pg   MCHC 31.9 30.0 - 36.0 g/dL   RDW 14.0 11.5 - 15.5 %   Platelets 290 150 - 400 K/uL  Differential     Status: Abnormal   Collection Time: 06/12/14  2:33 AM  Result Value Ref Range   Neutrophils Relative % 44 43 - 77 %   Lymphocytes Relative 48 (H) 12 - 46 %   Monocytes Relative 6 3 - 12 %   Eosinophils Relative 2 0 - 5 %   Basophils Relative 0 0 - 1 %   Neutro Abs 3.2 1.7 - 7.7 K/uL   Lymphs Abs 3.6 0.7 - 4.0 K/uL   Monocytes Absolute 0.4 0.1 - 1.0 K/uL   Eosinophils Absolute 0.1 0.0 - 0.7 K/uL   Basophils Absolute 0.0 0.0 - 0.1 K/uL   RBC Morphology TARGET CELLS   Comprehensive metabolic panel     Status: Abnormal   Collection Time: 06/12/14  2:33 AM  Result Value Ref Range   Sodium 142 137 - 147 mEq/L   Potassium 4.1 3.7 - 5.3 mEq/L   Chloride 103 96 - 112 mEq/L   CO2 25 19 - 32 mEq/L   Glucose, Bld 93 70 - 99 mg/dL   BUN 17 6 - 23 mg/dL   Creatinine, Ser 0.76 0.50 - 1.10 mg/dL   Calcium 9.4 8.4 - 10.5 mg/dL   Total Protein 7.1 6.0 - 8.3 g/dL   Albumin 3.6 3.5 - 5.2 g/dL   AST 25 0 - 37 U/L  ALT 25 0 - 35 U/L   Alkaline Phosphatase 90 39 - 117 U/L   Total Bilirubin <0.2 (L) 0.3 - 1.2 mg/dL   GFR calc non Af Amer >90 >90 mL/min   GFR calc Af Amer >90 >90 mL/min    Comment: (NOTE) The eGFR has been calculated using the CKD EPI equation. This calculation has not been validated in all clinical situations. eGFR's persistently <90 mL/min signify possible Chronic Kidney Disease.    Anion gap 14 5 - 15  I-stat troponin, ED (not at [facility])     Status: None   Collection Time: 06/12/14  2:47 AM  Result Value Ref Range   Troponin i, poc 0.00 0.00 - 0.08 ng/mL   Comment 3            Comment: Due to the release kinetics of cTnI, a negative result within the first hours of the onset of symptoms does not rule out myocardial infarction with certainty. If myocardial infarction is still suspected, repeat the test at appropriate intervals.   Urine Drug Screen     Status: Abnormal   Collection Time: 06/12/14  2:47 AM  Result Value Ref Range   Opiates NONE DETECTED NONE DETECTED   Cocaine POSITIVE (A) NONE DETECTED   Benzodiazepines NONE DETECTED NONE DETECTED   Amphetamines NONE DETECTED NONE DETECTED   Tetrahydrocannabinol NONE DETECTED NONE DETECTED   Barbiturates NONE DETECTED NONE DETECTED    Comment:        DRUG SCREEN FOR MEDICAL PURPOSES ONLY.  IF CONFIRMATION IS NEEDED FOR ANY PURPOSE, NOTIFY LAB WITHIN 5 DAYS.        LOWEST DETECTABLE LIMITS FOR URINE DRUG SCREEN Drug Class       Cutoff (ng/mL) Amphetamine      1000 Barbiturate      200 Benzodiazepine   798 Tricyclics       921 Opiates          300 Cocaine          300 THC              50   Urinalysis, Routine w reflex microscopic     Status: None   Collection Time: 06/12/14  2:47 AM  Result Value Ref Range   Color, Urine YELLOW YELLOW   APPearance CLEAR CLEAR   Specific Gravity, Urine 1.022 1.005 - 1.030   pH 5.0 5.0 - 8.0   Glucose, UA NEGATIVE NEGATIVE mg/dL   Hgb urine dipstick NEGATIVE NEGATIVE    Bilirubin Urine NEGATIVE NEGATIVE   Ketones, ur NEGATIVE NEGATIVE mg/dL   Protein, ur NEGATIVE NEGATIVE mg/dL   Urobilinogen, UA 0.2 0.0 - 1.0 mg/dL   Nitrite NEGATIVE NEGATIVE   Leukocytes, UA NEGATIVE NEGATIVE    Comment: MICROSCOPIC NOT DONE ON URINES WITH NEGATIVE PROTEIN, BLOOD, LEUKOCYTES, NITRITE, OR GLUCOSE <1000 mg/dL.    Ct Head Wo Contrast  06/12/2014   CLINICAL DATA:  LEFT-sided weakness for 2 days, tongue swelling and numbness. Dizziness, headache and nausea.  EXAM: CT HEAD WITHOUT CONTRAST  TECHNIQUE: Contiguous axial images were obtained from the base of the skull through the vertex without intravenous contrast.  COMPARISON:  CT of the head March 29, 2013  FINDINGS: The ventricles and sulci are normal. No intraparenchymal hemorrhage, mass effect nor midline shift. No acute large vascular territory infarcts.  No abnormal extra-axial fluid collections. Basal cisterns are patent. Mild calcific atherosclerosis.  No skull fracture. The included ocular globes and orbital contents are non-suspicious. Stable proptosis,  resolved retrobulbar hematoma. The mastoid aircells and included paranasal sinuses are well-aerated.  IMPRESSION: No acute intracranial process. Stable appearance of the head from March 29, 2013.   Electronically Signed   By: Elon Alas   On: 06/12/2014 03:44   Assessment/Plan: 53 y/o with new onset daily, recurrent, stereotype episodes characterized by left sided paresthesias with left leg jerkiness and inability to talk lasting approximately 5 minutes and without alteration of consciousness. Non focal neuro-exam and unremarkable CT brain. ? Left focal seizure without impairment of consciousness. ? Paroxysmal events in the setting of a demyelinating disorder. TIA less likely due to the stereotypic nature and short duration of the events. Will suggest MRI brain with/without contrast and EEG. Will follow up.   Dorian Pod, MD 06/12/2014, 5:20 AM  [facility]

## 2014-06-12 NOTE — ED Notes (Signed)
Attempted to call report

## 2014-06-12 NOTE — Progress Notes (Signed)
UR completed 

## 2014-06-13 ENCOUNTER — Observation Stay (HOSPITAL_COMMUNITY): Payer: No Typology Code available for payment source

## 2014-06-13 DIAGNOSIS — F1490 Cocaine use, unspecified, uncomplicated: Secondary | ICD-10-CM

## 2014-06-13 DIAGNOSIS — G4089 Other seizures: Secondary | ICD-10-CM

## 2014-06-13 LAB — CBC
HCT: 31 % — ABNORMAL LOW (ref 36.0–46.0)
Hemoglobin: 9.4 g/dL — ABNORMAL LOW (ref 12.0–15.0)
MCH: 21.5 pg — AB (ref 26.0–34.0)
MCHC: 30.3 g/dL (ref 30.0–36.0)
MCV: 70.9 fL — ABNORMAL LOW (ref 78.0–100.0)
PLATELETS: 274 10*3/uL (ref 150–400)
RBC: 4.37 MIL/uL (ref 3.87–5.11)
RDW: 13.9 % (ref 11.5–15.5)
WBC: 5.3 10*3/uL (ref 4.0–10.5)

## 2014-06-13 LAB — BASIC METABOLIC PANEL
ANION GAP: 14 (ref 5–15)
BUN: 18 mg/dL (ref 6–23)
CALCIUM: 9.4 mg/dL (ref 8.4–10.5)
CO2: 25 mEq/L (ref 19–32)
Chloride: 103 mEq/L (ref 96–112)
Creatinine, Ser: 0.8 mg/dL (ref 0.50–1.10)
GFR calc Af Amer: >90 mL/min (ref >90)
GFR calc non Af Amer: 83 mL/min — ABNORMAL LOW (ref >90)
GLUCOSE: 97 mg/dL (ref 70–99)
Potassium: 4 mEq/L (ref 3.7–5.3)
Sodium: 142 mEq/L (ref 137–147)

## 2014-06-13 LAB — GLUCOSE, CAPILLARY
GLUCOSE-CAPILLARY: 96 mg/dL (ref 70–99)
GLUCOSE-CAPILLARY: 144 mg/dL — AB (ref 70–99)
Glucose-Capillary: 132 mg/dL — ABNORMAL HIGH (ref 70–99)
Glucose-Capillary: 138 mg/dL — ABNORMAL HIGH (ref 70–99)

## 2014-06-13 MED ORDER — LORAZEPAM 2 MG/ML IJ SOLN
1.0000 mg | Freq: Once | INTRAMUSCULAR | Status: AC | PRN
  Administered 2014-06-13: 1 mg via INTRAVENOUS
  Filled 2014-06-13: qty 1

## 2014-06-13 MED ORDER — KETOROLAC TROMETHAMINE 15 MG/ML IJ SOLN
15.0000 mg | Freq: Four times a day (QID) | INTRAMUSCULAR | Status: DC | PRN
  Administered 2014-06-13 (×2): 15 mg via INTRAVENOUS
  Filled 2014-06-13 (×3): qty 1

## 2014-06-13 NOTE — Progress Notes (Signed)
Subjective: Patient continues to have intermittent left sided complaints.  Last episode was this morning.  Most spells last about 5 minutes.  Also complains of a left sided headache that has continued.  Objective: Current vital signs: BP 103/57 mmHg  Pulse 79  Temp(Src) 98 F (36.7 C) (Oral)  Resp 18  Ht 4\' 11"  (1.499 m)  Wt 71.215 kg (157 lb)  BMI 31.69 kg/m2  SpO2 100% Vital signs in last 24 hours: Temp:  [97.6 F (36.4 C)-98.5 F (36.9 C)] 98 F (36.7 C) (12/19 0800) Pulse Rate:  [71-91] 79 (12/19 0800) Resp:  [16-18] 18 (12/19 0800) BP: (95-142)/(57-80) 103/57 mmHg (12/19 0800) SpO2:  [98 %-100 %] 100 % (12/19 0800)  Intake/Output from previous day: 12/18 0701 - 12/19 0700 In: -  Out: 600 [Urine:600] Intake/Output this shift: Total I/O In: 240 [P.O.:240] Out: -  Nutritional status: Diet Heart  Neurologic Exam: Mental Status: Alert, oriented, thought content appropriate. Speech fluent without evidence of aphasia. Able to follow 3 step commands without difficulty. Cranial Nerves: II: Discs flat bilaterally; Visual fields grossly normal, pupils equal, round, reactive to light and accommodation III,IV, VI: exophthalmos, ptosis not present, extra-ocular motions intact bilaterally V,VII: smile symmetric, facial light touch sensation normal bilaterally VIII: hearing normal bilaterally IX,X: gag reflex present XI: bilateral shoulder shrug XII: midline tongue extension without atrophy or fasciculations Motor: 5/5 throughout; no atrophy noted Sensory: Pinprick and light touch intact throughout, bilaterally Deep Tendon Reflexes:  2+ throughout Plantars: Right: downgoingLeft: downgoing Cerebellar: normal finger-to-nose, normal heel-to-shin testing bilaterally  Lab Results: Basic Metabolic Panel:  Recent Labs Lab 06/12/14 0233 06/13/14 0303  NA 142 142  K 4.1 4.0  CL 103 103  CO2 25 25  GLUCOSE 93 97  BUN 17 18  CREATININE  0.76 0.80  CALCIUM 9.4 9.4    Liver Function Tests:  Recent Labs Lab 06/12/14 0233  AST 25  ALT 25  ALKPHOS 90  BILITOT <0.2*  PROT 7.1  ALBUMIN 3.6   No results for input(s): LIPASE, AMYLASE in the last 168 hours. No results for input(s): AMMONIA in the last 168 hours.  CBC:  Recent Labs Lab 06/12/14 0233 06/13/14 0303  WBC 7.3 5.3  NEUTROABS 3.2  --   HGB 10.3* 9.4*  HCT 32.3* 31.0*  MCV 71.3* 70.9*  PLT 290 274    Cardiac Enzymes: No results for input(s): CKTOTAL, CKMB, CKMBINDEX, TROPONINI in the last 168 hours.  Lipid Panel: No results for input(s): CHOL, TRIG, HDL, CHOLHDL, VLDL, LDLCALC in the last 168 hours.  CBG:  Recent Labs Lab 06/12/14 0754 06/12/14 1139 06/12/14 1859 06/12/14 2138 06/13/14 0827  GLUCAP 104* 113* 113* 121* 132*    Microbiology: Results for orders placed or performed during the hospital encounter of 05/17/14  Urine culture     Status: None   Collection Time: 05/17/14  7:43 PM  Result Value Ref Range Status   Specimen Description URINE, CLEAN CATCH  Final   Special Requests ADDED 2117  Final   Culture  Setup Time   Final    05/18/2014 09:56 Performed at [place]   Final    30,000 COLONIES/ML Performed at [facility]    Culture   Final    Multiple bacterial morphotypes present, none predominant. Suggest appropriate recollection if clinically indicated. Performed at [facility]    Report Status 05/19/2014 FINAL  Final    Coagulation Studies:  Recent Labs  06/12/14 [phone]  LABPROT 12.9  INR 0.96    Imaging: Ct Head Wo Contrast  06/12/2014   CLINICAL DATA:  LEFT-sided weakness for 2 days, tongue swelling and numbness. Dizziness, headache and nausea.  EXAM: CT HEAD WITHOUT CONTRAST  TECHNIQUE: Contiguous axial images were obtained from the base of the skull through the vertex without intravenous contrast.  COMPARISON:  CT of the head March 29, 2013  FINDINGS: The  ventricles and sulci are normal. No intraparenchymal hemorrhage, mass effect nor midline shift. No acute large vascular territory infarcts.  No abnormal extra-axial fluid collections. Basal cisterns are patent. Mild calcific atherosclerosis.  No skull fracture. The included ocular globes and orbital contents are non-suspicious. Stable proptosis, resolved retrobulbar hematoma. The mastoid aircells and included paranasal sinuses are well-aerated.  IMPRESSION: No acute intracranial process. Stable appearance of the head from March 29, 2013.   Electronically Signed   By: Elon Alas   On: 06/12/2014 03:44    Medications:  I have reviewed the patient's current medications. Scheduled: .  stroke: mapping our early stages of recovery book   Does not apply Once  . atorvastatin  40 mg Oral q1800  . ferrous sulfate  325 mg Oral TID  . gabapentin  300 mg Oral TID  . pantoprazole  40 mg Oral Daily    Assessment/Plan: Patient continues to have episodes.  Currently neurological examination unremarkable.  MRI and EEG pending.    Recommendations: 1.  Will follow up results of above testing.   LOS: 1 day   Alexis Goodell, MD [facility] [phone] 06/13/2014  10:34 AM

## 2014-06-13 NOTE — Progress Notes (Signed)
OT Cancellation Note  Patient Details Name: [name] MRN: [id number] DOB: 25-Jan-1961   Cancelled Treatment:    Reason Eval/Treat Not Completed: Other (comment). Pt states she is too woozy/dizzy to work with therapy. Talked with nurse and pt received meds prior to MRI.  Benito Mccreedy OTR/L [phone] 06/13/2014, 4:48 PM

## 2014-06-13 NOTE — Procedures (Signed)
ELECTROENCEPHALOGRAM REPORT   Patient: [name]       Room #: 3X54 EEG No. ID: [id number] Age: 53 y.o.        Sex: female Referring Physician: Dareen Piano Report Date:  06/13/2014        Interpreting Physician: Alexis Goodell  History: [name] is an 53 y.o. female with episodes of left sided numbness and headache evaluated to rule out seizure  Medications:  Scheduled: .  stroke: mapping our early stages of recovery book   Does not apply Once  . atorvastatin  40 mg Oral q1800  . ferrous sulfate  325 mg Oral TID  . gabapentin  300 mg Oral TID  . pantoprazole  40 mg Oral Daily    Conditions of Recording:  This is a 16 channel EEG carried out with the patient in the awake, drowsy and asleep states.  Description:  The waking background activity consists of a low voltage, symmetrical, fairly well organized, 9-10 Hz alpha activity, seen from the parieto-occipital and posterior temporal regions.  Low voltage fast activity, poorly organized, is seen anteriorly and is at times superimposed on more posterior regions.  A mixture of theta and alpha rhythms are seen from the central and temporal regions. The patient drowses with slowing to irregular, low voltage theta and beta activity.   The patient goes in to a light sleep with symmetrical sleep spindles, vertex central sharp transients and irregular slow activity.  Hyperventilation and intermittent photic stimulation were not performed.  IMPRESSION: Normal awake, drowsy and asleep electroencephalogram without activation procedures. There are no focal lateralizing or epileptiform features.   Alexis Goodell, MD [facility] [phone] 06/13/2014, 10:57 AM

## 2014-06-13 NOTE — Progress Notes (Signed)
EEG Completed; Results Pending  

## 2014-06-13 NOTE — Progress Notes (Signed)
UR completed 

## 2014-06-13 NOTE — Progress Notes (Signed)
Subjective: Was unable to get MRI due to claustrophobia despite Ativan 0.5mg  x 2. Reports she still has her headache. Denies further episodes of numbness, visual/speech impairments. Emesis after breakfast this morning but denies nausea presently. Denies abdominal pain or diarrhea. No chest pain.   Objective: Vital signs in last 24 hours: Filed Vitals:   06/12/14 1842 06/12/14 2013 06/13/14 0009 06/13/14 0400  BP: 123/68 104/64 95/72 142/80  Pulse:  91 77 71  Temp:  98.5 F (36.9 C) 97.6 F (36.4 C) 97.7 F (36.5 C)  TempSrc:  Oral Oral Oral  Resp: 16 16 16 16   Height:      Weight:      SpO2: 100% 100% 98% 100%   Weight change:   Intake/Output Summary (Last 24 hours) at 06/13/14 0817 Last data filed at 06/12/14 1900  Gross per 24 hour  Intake      0 ml  Output    600 ml  Net   -600 ml     General: alert, sitting up in bed, NAD HEENT: [place]/AT, EOMI, exophthalmos present, mucus membranes moist CV: RRR, normal S1/S2, no m/g/r Pulm: CTA bilaterally, breaths non-labored Abd: BS+, soft, non-tender Ext: warm, no edema, moves all Neuro: alert and oriented x 3. No aphasia. PERRL, EOMI, decreased light sensation on left side of face, left upper extremity, and left lower extremity, smile symmetric, shoulder shrug intact, able to stick out tongue midline, decreased left sided grip strength, intact biceps, triceps, and deltoids bilateral upper extremities. Strength symmetric 5/5 BLE.  Lab Results: Basic Metabolic Panel:  Recent Labs Lab 06/12/14 0233 06/13/14 0303  NA 142 142  K 4.1 4.0  CL 103 103  CO2 25 25  GLUCOSE 93 97  BUN 17 18  CREATININE 0.76 0.80  CALCIUM 9.4 9.4   Liver Function Tests:  Recent Labs Lab 06/12/14 0233  AST 25  ALT 25  ALKPHOS 90  BILITOT <0.2*  PROT 7.1  ALBUMIN 3.6   CBC:  Recent Labs Lab 06/12/14 0233 06/13/14 0303  WBC 7.3 5.3  NEUTROABS 3.2  --   HGB 10.3* 9.4*  HCT 32.3* 31.0*  MCV 71.3* 70.9*  PLT 290 274    CBG:  Recent Labs Lab 06/12/14 0754 06/12/14 1139 06/12/14 1859 06/12/14 2138  GLUCAP 104* 113* 113* 121*   Thyroid Function Tests:  Recent Labs Lab 06/12/14 0741  TSH 0.965   Coagulation:  Recent Labs Lab 06/12/14 0233  LABPROT 12.9  INR 0.96   Urine Drug Screen: Drugs of Abuse     Component Value Date/Time   LABOPIA NONE DETECTED 06/12/2014 0247   COCAINSCRNUR POSITIVE* 06/12/2014 0247   COCAINSCRNUR POS* 07/30/2009 1818   LABBENZ NONE DETECTED 06/12/2014 0247   LABBENZ NEG 07/30/2009 1818   AMPHETMU NONE DETECTED 06/12/2014 0247   AMPHETMU NEG 07/30/2009 1818   THCU NONE DETECTED 06/12/2014 0247   LABBARB NONE DETECTED 06/12/2014 0247    Alcohol Level:  Recent Labs Lab 06/12/14 0233  ETH <11   Urinalysis:  Recent Labs Lab 06/12/14 0247  COLORURINE YELLOW  LABSPEC 1.022  PHURINE 5.0  GLUCOSEU NEGATIVE  HGBUR NEGATIVE  BILIRUBINUR NEGATIVE  KETONESUR NEGATIVE  PROTEINUR NEGATIVE  UROBILINOGEN 0.2  NITRITE NEGATIVE  LEUKOCYTESUR NEGATIVE    Micro Results: No results found for this or any previous visit (from the past 240 hour(s)).   Studies/Results: Ct Head Wo Contrast  06/12/2014   CLINICAL DATA:  LEFT-sided weakness for 2 days, tongue swelling and numbness. Dizziness, headache  and nausea.  EXAM: CT HEAD WITHOUT CONTRAST  TECHNIQUE: Contiguous axial images were obtained from the base of the skull through the vertex without intravenous contrast.  COMPARISON:  CT of the head March 29, 2013  FINDINGS: The ventricles and sulci are normal. No intraparenchymal hemorrhage, mass effect nor midline shift. No acute large vascular territory infarcts.  No abnormal extra-axial fluid collections. Basal cisterns are patent. Mild calcific atherosclerosis.  No skull fracture. The included ocular globes and orbital contents are non-suspicious. Stable proptosis, resolved retrobulbar hematoma. The mastoid aircells and included paranasal sinuses are  well-aerated.  IMPRESSION: No acute intracranial process. Stable appearance of the head from March 29, 2013.   Electronically Signed   By: Elon Alas   On: 06/12/2014 03:44   Medications: I have reviewed the patient's current medications. Scheduled Meds: .  stroke: mapping our early stages of recovery book   Does not apply Once  . atorvastatin  40 mg Oral q1800  . ferrous sulfate  325 mg Oral TID  . gabapentin  300 mg Oral TID  . pantoprazole  40 mg Oral Daily   Continuous Infusions:  PRN Meds:.acetaminophen, ketorolac Assessment/Plan: Principal Problem:   TIA (transient ischemic attack) Active Problems:   Hyperlipemia   COCAINE ABUSE, EPISODIC   TOBACCO ABUSE   Essential hypertension, benign   Cervical radiculitis  Left focal seizure without impairment of consciousness vs demyelinating d/o vs less likely TIA: CT Head negative for acute stroke. +cocaine on UDS. She reports she last used cocaine about 4 days ago. Neuro following. No recommendations by PT - MRI brain pending - EEG pending - Cardiac monitoring - PT/OT - No ASA given as she has an allergy to it.   Cervical Radiculitis: MRI Cervical Spine on 04/07/14 showed foraminal narrowing on the left could possibly affect the C7 nerve root. Patient notes she has baseline tingling in her left arm down to her fingers. She takes Gabapentin 300 mg TID and Norco 5-325 mg "very rarely" if she has pain.  - Continue Gabapentin 300 mg TID - Hold Norco  HTN: BP 171/95 on admission. Patient takes Lisinopril-HCTZ 20-25 mg daily at home per records. - Stop Lisinopril-HCTZ - Allow for permissive HTN given possible TIA   HLD: Last lipid profile on 04/23/14 shows Chol 210, Trigly 188, HDL 47, and LDL 125. Patient used to be on Pravastatin 40 mg daily, but stopped taking this medication due to insurance issues. Patient now has insurance so will restart statin.  - Start Lipitor 40 mg daily   Tobacco Abuse: Patient reports she is down  to 1 cigarette per day. - Continue to encourage smoking cessation   Diet: Heart healthy diet VTE PPx: SCDs  Dispo: Disposition is deferred at this time, awaiting improvement of current medical problems. Anticipated discharge in approximately 0-1 day(s).   The patient does have a current PCP Bartholomew Crews, MD) and does need an [facility] hospital follow-up appointment after discharge.  The patient does not have transportation limitations that hinder transportation to clinic appointments. .Services Needed at time of discharge: Y = Yes, Blank = No PT:   OT:   RN:   Equipment:   Other:     LOS: 1 day   Jacques Earthly, MD 06/13/2014, 8:17 AM

## 2014-06-14 LAB — CBC
HCT: 31 % — ABNORMAL LOW (ref 36.0–46.0)
Hemoglobin: 9.5 g/dL — ABNORMAL LOW (ref 12.0–15.0)
MCH: 22.4 pg — ABNORMAL LOW (ref 26.0–34.0)
MCHC: 30.6 g/dL (ref 30.0–36.0)
MCV: 73.1 fL — ABNORMAL LOW (ref 78.0–100.0)
PLATELETS: 275 10*3/uL (ref 150–400)
RBC: 4.24 MIL/uL (ref 3.87–5.11)
RDW: 14.3 % (ref 11.5–15.5)
WBC: 5 10*3/uL (ref 4.0–10.5)

## 2014-06-14 LAB — BASIC METABOLIC PANEL
ANION GAP: 12 (ref 5–15)
BUN: 18 mg/dL (ref 6–23)
CALCIUM: 9.3 mg/dL (ref 8.4–10.5)
CHLORIDE: 105 meq/L (ref 96–112)
CO2: 25 mEq/L (ref 19–32)
Creatinine, Ser: 0.77 mg/dL (ref 0.50–1.10)
GFR calc non Af Amer: >90 mL/min (ref >90)
Glucose, Bld: 107 mg/dL — ABNORMAL HIGH (ref 70–99)
Potassium: 4 mEq/L (ref 3.7–5.3)
SODIUM: 142 meq/L (ref 137–147)

## 2014-06-14 LAB — GLUCOSE, CAPILLARY: Glucose-Capillary: 88 mg/dL (ref 70–99)

## 2014-06-14 MED ORDER — GABAPENTIN 600 MG PO TABS: 600.0000 mg | ORAL_TABLET | Freq: Three times a day (TID) | ORAL | Status: DC

## 2014-06-14 MED ORDER — ATORVASTATIN CALCIUM 40 MG PO TABS
40.0000 mg | ORAL_TABLET | Freq: Every day | ORAL | Status: DC
Start: 2014-06-14 — End: 2016-02-17

## 2014-06-14 MED ORDER — GABAPENTIN 300 MG PO CAPS: 300.0000 mg | ORAL_CAPSULE | Freq: Once | ORAL | Status: DC

## 2014-06-14 MED ORDER — IBUPROFEN 600 MG PO TABS
600.0000 mg | ORAL_TABLET | Freq: Four times a day (QID) | ORAL | Status: DC | PRN
  Administered 2014-06-14: 600 mg via ORAL
  Filled 2014-06-14: qty 1

## 2014-06-14 MED ORDER — IBUPROFEN 800 MG PO TABS: 800.0000 mg | ORAL_TABLET | Freq: Three times a day (TID) | ORAL | Status: DC | PRN

## 2014-06-14 MED ORDER — GABAPENTIN 300 MG PO CAPS: 600.0000 mg | ORAL_CAPSULE | Freq: Three times a day (TID) | ORAL | Status: DC

## 2014-06-14 NOTE — Progress Notes (Addendum)
Subjective: Patient reports that she continues to have the episodes.  Her description this morning is that of tingling on the left that is short-lived and often relieved by repositioning.  Headache on the left continues as well.  Patient recently started on Neurontin for various left sided complaints.    Objective: Current vital signs: BP 133/87 mmHg  Pulse 80  Temp(Src) 98.1 F (36.7 C) (Oral)  Resp 20  Ht 4\' 11"  (1.499 m)  Wt 66.497 kg (146 lb 9.6 oz)  BMI 29.59 kg/m2  SpO2 100% Vital signs in last 24 hours: Temp:  [97.7 F (36.5 C)-99 F (37.2 C)] 98.1 F (36.7 C) (12/20 0800) Pulse Rate:  [69-86] 80 (12/20 0800) Resp:  [18-20] 20 (12/20 0800) BP: (106-144)/(63-87) 133/87 mmHg (12/20 0800) SpO2:  [96 %-100 %] 100 % (12/20 0800) Weight:  [66.497 kg (146 lb 9.6 oz)] 66.497 kg (146 lb 9.6 oz) (12/20 0000)  Intake/Output from previous day: 12/19 0701 - 12/20 0700 In: 1080 [P.O.:1080] Out: 1100 [Urine:1100] Intake/Output this shift: Total I/O In: 360 [P.O.:360] Out: 250 [Urine:250] Nutritional status: Diet Heart Diet - low sodium heart healthy  Neurologic Exam: Mental Status: Alert, oriented, thought content appropriate. Speech fluent without evidence of aphasia. Able to follow 3 step commands without difficulty. Cranial Nerves: II: Discs flat bilaterally; Visual fields grossly normal, pupils equal, round, reactive to light and accommodation III,IV, VI: exophthalmos, ptosis not present, extra-ocular motions intact bilaterally V,VII: smile symmetric, facial light touch sensation normal bilaterally VIII: hearing normal bilaterally IX,X: gag reflex present XI: bilateral shoulder shrug XII: midline tongue extension without atrophy or fasciculations Motor: 5/5 throughout; no atrophy noted Sensory: Pinprick and light touch intact throughout, bilaterally Deep Tendon Reflexes:  2+ throughout Plantars: Right: downgoingLeft:  downgoing Cerebellar: normal finger-to-nose, normal heel-to-shin testing bilaterally  Lab Results: Basic Metabolic Panel:  Recent Labs Lab 06/12/14 0233 06/13/14 0303 06/14/14 0346  NA 142 142 142  K 4.1 4.0 4.0  CL 103 103 105  CO2 25 25 25   GLUCOSE 93 97 107*  BUN 17 18 18   CREATININE 0.76 0.80 0.77  CALCIUM 9.4 9.4 9.3    Liver Function Tests:  Recent Labs Lab 06/12/14 0233  AST 25  ALT 25  ALKPHOS 90  BILITOT <0.2*  PROT 7.1  ALBUMIN 3.6   No results for input(s): LIPASE, AMYLASE in the last 168 hours. No results for input(s): AMMONIA in the last 168 hours.  CBC:  Recent Labs Lab 06/12/14 0233 06/13/14 0303 06/14/14 0346  WBC 7.3 5.3 5.0  NEUTROABS 3.2  --   --   HGB 10.3* 9.4* 9.5*  HCT 32.3* 31.0* 31.0*  MCV 71.3* 70.9* 73.1*  PLT 290 274 275    Cardiac Enzymes: No results for input(s): CKTOTAL, CKMB, CKMBINDEX, TROPONINI in the last 168 hours.  Lipid Panel: No results for input(s): CHOL, TRIG, HDL, CHOLHDL, VLDL, LDLCALC in the last 168 hours.  CBG:  Recent Labs Lab 06/13/14 0827 06/13/14 1201 06/13/14 1640 06/13/14 2109 06/14/14 0741  GLUCAP 132* 96 144* 138* 31    Microbiology: Results for orders placed or performed during the hospital encounter of 05/17/14  Urine culture     Status: None   Collection Time: 05/17/14  7:43 PM  Result Value Ref Range Status   Specimen Description URINE, CLEAN CATCH  Final   Special Requests ADDED 2117  Final   Culture  Setup Time   Final    05/18/2014 09:56 Performed at [facility]  Colony Count   Final    30,000 COLONIES/ML Performed at [facility]    Culture   Final    Multiple bacterial morphotypes present, none predominant. Suggest appropriate recollection if clinically indicated. Performed at [facility]    Report Status 05/19/2014 FINAL  Final    Coagulation Studies:  Recent Labs  06/12/14 0233  LABPROT 12.9  INR 0.96    Imaging: Mr Brain  Wo Contrast  06/13/2014   CLINICAL DATA:  53 year old female with intermittent episodes of left side numbness and tingling, uncontrollable left lower extremity movement. Initial encounter.  EXAM: MRI HEAD WITHOUT CONTRAST  TECHNIQUE: Multiplanar, multiecho pulse sequences of the brain and surrounding structures were obtained without intravenous contrast.  COMPARISON:  Head CT without contrast 06/12/2014 and earlier. Cervical spine MRI 04/07/2014.  FINDINGS: Despite anxiolysis medication use and repeated imaging attempts the study is intermittently degraded by motion.  Cerebral volume is normal. No restricted diffusion to suggest acute infarction. No midline shift, mass effect, evidence of mass lesion, ventriculomegaly, extra-axial collection or acute intracranial hemorrhage. Cervicomedullary junction and pituitary are within normal limits. Negative visualized cervical spine. Major intracranial vascular flow voids are preserved.  Pearline Cables and white matter signal is within normal limits for age throughout the brain.  Visible internal auditory structures appear normal. Visualized paranasal sinuses and mastoids are clear. Visualized orbit soft tissues are within normal limits. Visualized scalp soft tissues are within normal limits. Normal bone marrow signal.  IMPRESSION: No acute intracranial abnormality identified. Normal for age non contrast MRI appearance of the brain.   Electronically Signed   By: Lars Pinks M.D.   On: 06/13/2014 15:52    Medications:  I have reviewed the patient's current medications. Scheduled: .  stroke: mapping our early stages of recovery book   Does not apply Once  . atorvastatin  40 mg Oral q1800  . ferrous sulfate  325 mg Oral TID  . gabapentin  300 mg Oral TID  . pantoprazole  40 mg Oral Daily    Assessment/Plan: Patient reports that symptoms continue.  MRI of the brain personally reviewed and shows no acute changes.  EEG shows no epileptiform activity.  After further  characterization of episodes clinically doubt seizure or TIA.  ?fibromyalgia or similar.    Recommendations: 1.  Increase Neurontin to 600mg  TID. 2.  If no benefit from Neurontin increase or with side effects may want to consider its discontinuation and initiation of Lyrica.   3.  Outpatient PT 4.  No further neurologic intervention is recommended at this time.  If further questions arise, please call or page at that time.  Thank you for allowing neurology to participate in the care of this patient.    LOS: 2 days   Alexis Goodell, MD [facility] [phone] 06/14/2014  11:07 AM

## 2014-06-14 NOTE — Progress Notes (Signed)
Subjective: Reports she is doing well this morning. Improved overall. She still has some headache and left arm pain. Tolerated breakfast.  Objective: Vital signs in last 24 hours: Filed Vitals:   06/13/14 2000 06/14/14 0000 06/14/14 0400 06/14/14 0800  BP: 117/78 106/63 121/77 133/87  Pulse: 86 79 72 80  Temp: 98.8 F (37.1 C) 99 F (37.2 C) 97.7 F (36.5 C) 98.1 F (36.7 C)  TempSrc: Oral Oral Oral Oral  Resp: 18 18 18 20   Height:      Weight:  146 lb 9.6 oz (66.497 kg)    SpO2: 96% 98% 100% 100%   Weight change:   Intake/Output Summary (Last 24 hours) at 06/14/14 1031 Last data filed at 06/14/14 0900  Gross per 24 hour  Intake   1200 ml  Output   1350 ml  Net   -150 ml     General: alert, sitting up in bed, NAD HEENT: [place]/AT, EOMI, exophthalmos present, mucus membranes moist CV: RRR, normal S1/S2, no m/g/r Pulm: CTA bilaterally, breaths non-labored Abd: BS+, soft, non-tender Ext: warm, no edema, moves all Neuro: alert and oriented x 3. No aphasia. PERRL, EOMI, decreased light sensation on left side of face, left upper extremity, and left lower extremity, smile symmetric, shoulder shrug intact, able to stick out tongue midline. Strength symmetric 5/5 BUE and LE.  Lab Results: Basic Metabolic Panel:  Recent Labs Lab 06/13/14 0303 06/14/14 0346  NA 142 142  K 4.0 4.0  CL 103 105  CO2 25 25  GLUCOSE 97 107*  BUN 18 18  CREATININE 0.80 0.77  CALCIUM 9.4 9.3   Liver Function Tests:  Recent Labs Lab 06/12/14 0233  AST 25  ALT 25  ALKPHOS 90  BILITOT <0.2*  PROT 7.1  ALBUMIN 3.6   CBC:  Recent Labs Lab 06/12/14 0233 06/13/14 0303 06/14/14 0346  WBC 7.3 5.3 5.0  NEUTROABS 3.2  --   --   HGB 10.3* 9.4* 9.5*  HCT 32.3* 31.0* 31.0*  MCV 71.3* 70.9* 73.1*  PLT 290 274 275   CBG:  Recent Labs Lab 06/12/14 2138 06/13/14 0827 06/13/14 1201 06/13/14 1640 06/13/14 2109 06/14/14 0741  GLUCAP 121* 132* 96 144* 138* 88   Thyroid Function  Tests:  Recent Labs Lab 06/12/14 0741  TSH 0.965   Coagulation:  Recent Labs Lab 06/12/14 0233  LABPROT 12.9  INR 0.96   Urine Drug Screen: Drugs of Abuse     Component Value Date/Time   LABOPIA NONE DETECTED 06/12/2014 0247   COCAINSCRNUR POSITIVE* 06/12/2014 0247   COCAINSCRNUR POS* 07/30/2009 1818   LABBENZ NONE DETECTED 06/12/2014 0247   LABBENZ NEG 07/30/2009 1818   AMPHETMU NONE DETECTED 06/12/2014 0247   AMPHETMU NEG 07/30/2009 1818   THCU NONE DETECTED 06/12/2014 0247   LABBARB NONE DETECTED 06/12/2014 0247    Alcohol Level:  Recent Labs Lab 06/12/14 0233  ETH <11   Urinalysis:  Recent Labs Lab 06/12/14 0247  COLORURINE YELLOW  LABSPEC 1.022  PHURINE 5.0  GLUCOSEU NEGATIVE  HGBUR NEGATIVE  BILIRUBINUR NEGATIVE  KETONESUR NEGATIVE  PROTEINUR NEGATIVE  UROBILINOGEN 0.2  NITRITE NEGATIVE  LEUKOCYTESUR NEGATIVE    Micro Results: No results found for this or any previous visit (from the past 240 hour(s)).   Studies/Results: Mr Herby Abraham Contrast  06/13/2014   CLINICAL DATA:  53 year old female with intermittent episodes of left side numbness and tingling, uncontrollable left lower extremity movement. Initial encounter.  EXAM: MRI HEAD WITHOUT CONTRAST  TECHNIQUE:  Multiplanar, multiecho pulse sequences of the brain and surrounding structures were obtained without intravenous contrast.  COMPARISON:  Head CT without contrast 06/12/2014 and earlier. Cervical spine MRI 04/07/2014.  FINDINGS: Despite anxiolysis medication use and repeated imaging attempts the study is intermittently degraded by motion.  Cerebral volume is normal. No restricted diffusion to suggest acute infarction. No midline shift, mass effect, evidence of mass lesion, ventriculomegaly, extra-axial collection or acute intracranial hemorrhage. Cervicomedullary junction and pituitary are within normal limits. Negative visualized cervical spine. Major intracranial vascular flow voids are  preserved.  Pearline Cables and white matter signal is within normal limits for age throughout the brain.  Visible internal auditory structures appear normal. Visualized paranasal sinuses and mastoids are clear. Visualized orbit soft tissues are within normal limits. Visualized scalp soft tissues are within normal limits. Normal bone marrow signal.  IMPRESSION: No acute intracranial abnormality identified. Normal for age non contrast MRI appearance of the brain.   Electronically Signed   By: Lars Pinks M.D.   On: 06/13/2014 15:52   Medications: I have reviewed the patient's current medications. Scheduled Meds: .  stroke: mapping our early stages of recovery book   Does not apply Once  . atorvastatin  40 mg Oral q1800  . ferrous sulfate  325 mg Oral TID  . gabapentin  300 mg Oral TID  . pantoprazole  40 mg Oral Daily   Continuous Infusions:  PRN Meds:.acetaminophen, ibuprofen Assessment/Plan: Principal Problem:   TIA (transient ischemic attack) Active Problems:   Hyperlipemia   COCAINE ABUSE, EPISODIC   TOBACCO ABUSE   Essential hypertension, benign   Cervical radiculitis  ?TIA: CT Head negative for acute stroke. +cocaine on UDS. She reports she last used cocaine about 4 days ago. Neuro following. No recommendations by PT. MRI with no acute intracranial abnormality. EEG normal with no focal lateralizing or epileptiform features. Per neurology unlikely to be seizure or TIA. ?fibromyalgia or similar. - Increase Neurontin to 600mg  TID - Cardiac monitoring - Outpatient PT  Cervical Radiculitis: MRI Cervical Spine on 04/07/14 showed foraminal narrowing on the left could possibly affect the C7 nerve root. Patient notes she has baseline tingling in her left arm down to her fingers. She takes Gabapentin 300 mg TID and Norco 5-325 mg "very rarely" if she has pain.  - Neurontin as above - Hold Norco  HTN: BP 171/95 on admission. Patient takes Lisinopril-HCTZ 20-25 mg daily at home per records. Remains  normotensive - Restart Lisinopril-HCTZ at discharge  HLD: Last lipid profile on 04/23/14 shows Chol 210, Trigly 188, HDL 47, and LDL 125. Patient used to be on Pravastatin 40 mg daily, but stopped taking this medication due to insurance issues. Patient now has insurance so will restart statin.  - Continue Lipitor 40 mg daily   Tobacco Abuse: Patient reports she is down to 1 cigarette per day. - Continue to encourage smoking cessation   Diet: Heart healthy diet VTE PPx: SCDs  Dispo: Likely home today  The patient does have a current PCP Bartholomew Crews, MD) and does need an [facility] hospital follow-up appointment after discharge.  The patient does not have transportation limitations that hinder transportation to clinic appointments. .Services Needed at time of discharge: Y = Yes, Blank = No PT:   OT:   RN:   Equipment:   Other:     LOS: 2 days   Jacques Earthly, MD 06/14/2014, 10:31 AM

## 2014-06-14 NOTE — Discharge Instructions (Signed)
General Headache Without Cause  A general headache is pain or discomfort felt around the head or neck area. The cause may not be found.   HOME CARE   · Keep all doctor visits.  · Only take medicines as told by your doctor.  · Lie down in a dark, quiet room when you have a headache.  · Keep a journal to find out if certain things bring on headaches. For example, write down:  ¨ What you eat and drink.  ¨ How much sleep you get.  ¨ Any change to your diet or medicines.  · Relax by getting a massage or doing other relaxing activities.  · Put ice or heat packs on the head and neck area as told by your doctor.  · Lessen stress.  · Sit up straight. Do not tighten (tense) your muscles.  · Quit smoking if you smoke.  · Lessen how much alcohol you drink.  · Lessen how much caffeine you drink, or stop drinking caffeine.  · Eat and sleep on a regular schedule.  · Get 7 to 9 hours of sleep, or as told by your doctor.  · Keep lights dim if bright lights bother you or make your headaches worse.  GET HELP RIGHT AWAY IF:   · Your headache becomes really bad.  · You have a fever.  · You have a stiff neck.  · You have trouble seeing.  · Your muscles are weak, or you lose muscle control.  · You lose your balance or have trouble walking.  · You feel like you will pass out (faint), or you pass out.  · You have really bad symptoms that are different than your first symptoms.  · You have problems with the medicines given to you by your doctor.  · Your medicines do not work.  · Your headache feels different than the other headaches.  · You feel sick to your stomach (nauseous) or throw up (vomit).  MAKE SURE YOU:   · Understand these instructions.  · Will watch your condition.  · Will get help right away if you are not doing well or get worse.  Document Released: 03/21/2008 Document Revised: 09/04/2011 Document Reviewed: 06/02/2011  [employer]® Patient Information ©2015 [employer]. This information is not intended to replace advice given to  you by your health care provider. Make sure you discuss any questions you have with your health care provider.

## 2014-06-15 NOTE — Discharge Summary (Signed)
Name: [name] MRN: [id number] DOB: December 05, 1960 53 y.o. PCP: Ruth Crews, MD  Date of Admission: 06/12/2014  2:25 AM Date of Discharge: 06/14/2014 Attending Physician: Ruth Contes, MD  Discharge Diagnosis: Principal Problem:   TIA (transient ischemic attack) Active Problems:   Hyperlipemia   COCAINE ABUSE, EPISODIC   TOBACCO ABUSE   Essential hypertension, benign   Cervical radiculitis  Discharge Medications:   Medication List    TAKE these medications        atorvastatin 40 MG tablet  Commonly known as:  LIPITOR  Take 1 tablet (40 mg total) by mouth daily at 6 PM.     gabapentin 300 MG capsule  Commonly known as:  NEURONTIN  Take 2 capsules (600 mg total) by mouth 3 (three) times daily.     HYDROcodone-acetaminophen 5-325 MG per tablet  Commonly known as:  NORCO/VICODIN  Take 1 tablet by mouth every 6 (six) hours as needed.     ibuprofen 800 MG tablet  Commonly known as:  ADVIL,MOTRIN  Take 1 tablet (800 mg total) by mouth every 8 (eight) hours as needed for headache or moderate pain.     IRON SUPPLEMENT 325 (65 FE) MG tablet  Generic drug:  ferrous sulfate  Take 325 mg by mouth 3 (three) times daily.     lisinopril-hydrochlorothiazide 20-25 MG per tablet  Commonly known as:  PRINZIDE,ZESTORETIC  Take 1 tablet by mouth daily.     omeprazole 20 MG capsule  Commonly known as:  PRILOSEC  Take 1 capsule (20 mg total) by mouth daily.        Disposition and follow-up:   [name] was discharged from [facility] in Stable condition.  At the hospital follow up visit please address:  1.  Cervical radiculopathy, ?fibromyalgia or similar: Her Neurontin was increased to 637m TID. Please evaluate for benefit/tolerance of this medication - may consider switching her to Lyrica. She will need referral for outpatient PT.  2.  Labs / imaging needed at time of follow-up: None  3.  Pending labs/ test needing follow-up:  None  Follow-up Appointments:     Follow-up Information    Follow up with BLarey Dresser MD.   Specialty:  Internal Medicine   Why:  The clinic will call you with an appointment. If you do not hear from them by Tuesday, please call for appointment   Contact information:   [phone]      Follow up with [facility] Schedule an appointment as soon as possible for a visit in 4 weeks.   Contact information:   [address] [id number]     Discharge Instructions: Discharge Instructions    Call MD for:  difficulty breathing, headache or visual disturbances    Complete by:  As directed      Call MD for:  persistant dizziness or light-headedness    Complete by:  As directed      Call MD for:  persistant nausea and vomiting    Complete by:  As directed      Call MD for:  severe uncontrolled pain    Complete by:  As directed      Call MD for:  temperature >100.4    Complete by:  As directed      Diet - low sodium heart healthy    Complete by:  As directed  Increase activity slowly    Complete by:  As directed            Consultations:    Procedures Performed:  Ct Head Wo Contrast  06/12/2014   CLINICAL DATA:  LEFT-sided weakness for 2 days, tongue swelling and numbness. Dizziness, headache and nausea.  EXAM: CT HEAD WITHOUT CONTRAST  TECHNIQUE: Contiguous axial images were obtained from the base of the skull through the vertex without intravenous contrast.  COMPARISON:  CT of the head March 29, 2013  FINDINGS: The ventricles and sulci are normal. No intraparenchymal hemorrhage, mass effect nor midline shift. No acute large vascular territory infarcts.  No abnormal extra-axial fluid collections. Basal cisterns are patent. Mild calcific atherosclerosis.  No skull fracture. The included ocular globes and orbital contents are non-suspicious. Stable proptosis,  resolved retrobulbar hematoma. The mastoid aircells and included paranasal sinuses are well-aerated.  IMPRESSION: No acute intracranial process. Stable appearance of the head from March 29, 2013.   Electronically Signed   By: [name]   On: 06/12/2014 03:44   Mr Brain Wo Contrast  06/13/2014   CLINICAL DATA:  53 year old female with intermittent episodes of left side numbness and tingling, uncontrollable left lower extremity movement. Initial encounter.  EXAM: MRI HEAD WITHOUT CONTRAST  TECHNIQUE: Multiplanar, multiecho pulse sequences of the brain and surrounding structures were obtained without intravenous contrast.  COMPARISON:  Head CT without contrast 06/12/2014 and earlier. Cervical spine MRI 04/07/2014.  FINDINGS: Despite anxiolysis medication use and repeated imaging attempts the study is intermittently degraded by motion.  Cerebral volume is normal. No restricted diffusion to suggest acute infarction. No midline shift, mass effect, evidence of mass lesion, ventriculomegaly, extra-axial collection or acute intracranial hemorrhage. Cervicomedullary junction and pituitary are within normal limits. Negative visualized cervical spine. Major intracranial vascular flow voids are preserved.  [name] and white matter signal is within normal limits for age throughout the brain.  Visible internal auditory structures appear normal. Visualized paranasal sinuses and mastoids are clear. Visualized orbit soft tissues are within normal limits. Visualized scalp soft tissues are within normal limits. Normal bone marrow signal.  IMPRESSION: No acute intracranial abnormality identified. Normal for age non contrast MRI appearance of the brain.   Electronically Signed   By: [name] M.D.   On: 06/13/2014 15:52    Admission HPI: [name] is a 53yo woman w/ PMHx of HTN, HLD, GERD, anemia, and cervical radiculitis who presents to the ED complaining of numbness on the left side of her body. Patient states her  "attacks" started 4 days ago where she has numbness over the entire left side of the body, blurry vision in her left eye, and she is unable to talk or move. She states she feels that her tongue "swells up" and that she is unable to talk. She states the attacks last approximately 5 minutes and then resolve. She reports approximately 10 attacks over the last 4 days. She states her attacks are worsening as the attack this morning awoke her from sleep. She reports associated dizziness, confusion, and headache. She denies weakness, diplopia, dysphagia, and syncope. She states with her cervical radiculitis she has tingling in her left arm down to her fingers at baseline, but these attacks are much different.   In the ED, CT Head was negative for acute stroke.   Hospital Course by problem list: Principal Problem:   TIA (transient ischemic attack) Active Problems:   Hyperlipemia   COCAINE ABUSE, EPISODIC   TOBACCO ABUSE  Essential hypertension, benign   Cervical radiculitis   1. ?TIA: Patient presented with 4 day history of intermittent episodes of numbness on the entire left side of the body, left-sided blurry vision, and inability to move or talk. She reported ~ 10 episodes over 4 days prior to arrival. Exam significant for sensation deficits in her left extremities and increased reflexes. CT Head negative for acute stroke. Patient did have +cocaine on UDS. She reported she last used cocaine about 4 days prior to arrival. Neurology was consulted. MRI had no acute intracranial abnormality. EEG normal with no focal lateralizing or epileptiform features. Per neurology unlikely to be seizure or TIA. ?fibromyalgia or similar. Her Neurontin was increased to 623m TID. She will need outpatient PT.   2. Cervical Radiculitis: MRI Cervical Spine on 04/07/14 showed foraminal narrowing on the left could possibly affect the C7 nerve root. Patient noted she had baseline tingling in her left arm down to her fingers. She  takes Gabapentin 300 mg TID and Norco 5-325 mg "very rarely" if she has pain. Her gabapentin was increased to 6034mTID.  3. HTN: BP 171/95 on admission. Patient takes Lisinopril-HCTZ 20-25 mg daily at home per records. Her Lisinopril-HCTZ was held during hospitalization but was restarted upon discharge.  4. HLD: Last lipid profile on 04/23/14 shows Chol 210, Trigly 188, HDL 47, and LDL 125. Patient used to be on Pravastatin 40 mg daily, but stopped taking this medication due to insurance issues. Patient now has insurance so she was restarted on Lipitor 4062maily.   5. Tobacco Abuse: Patient reports she is down to 1 cigarette per day.  Discharge Vitals:   BP 133/87 mmHg  Pulse 80  Temp(Src) 98.1 F (36.7 C) (Oral)  Resp 20  Ht '4\' 11"'  (1.499 m)  Wt 146 lb 9.6 oz (66.497 kg)  BMI 29.59 kg/m2  SpO2 100%  Discharge Labs:  Recent Results (from the past 2160 hour(s))  BMP with Estimated GFR ([id number]   Status: None   Collection Time: 04/23/14  9:10 AM  Result Value Ref Range   Sodium 142 135 - 145 mEq/L   Potassium 3.9 3.5 - 5.3 mEq/L   Chloride 107 96 - 112 mEq/L   CO2 21 19 - 32 mEq/L   Glucose, Bld 90 70 - 99 mg/dL   BUN 13 6 - 23 mg/dL   Creat 0.60 0.50 - 1.10 mg/dL   Calcium 9.0 8.4 - 10.5 mg/dL   GFR, Est African American >89 mL/min   GFR, Est Non African American >89 mL/min    Comment:   The estimated GFR is a calculation valid for adults (>=18 20ars old) that uses the CKD-EPI algorithm to adjust for age and sex. It is   not to be used for children, pregnant women, hospitalized patients,    patients on dialysis, or with rapidly changing kidney function. According to the NKDEP, eGFR >89 is normal, 60-89 shows mild impairment, 30-59 shows moderate impairment, 15-29 shows severe impairment and <15 is ESRD.    Lipid Profile     Status: Abnormal   Collection Time: 04/23/14  9:10 AM  Result Value Ref Range   Cholesterol 210 (H) 0 - 200 mg/dL    Comment: ATP III  Classification:       < 200        mg/dL        Desirable      200 - 239     mg/dL  Borderline High      >= 240        mg/dL        High     Triglycerides 188 (H) <150 mg/dL   HDL 47 >39 mg/dL   Total CHOL/HDL Ratio 4.5 Ratio   VLDL 38 0 - 40 mg/dL   LDL Cholesterol 125 (H) 0 - 99 mg/dL    Comment:   Total Cholesterol/HDL Ratio:CHD Risk                        Coronary Heart Disease Risk Table                                        Men       Women          1/2 Average Risk              3.4        3.3              Average Risk              5.0        4.4           2X Average Risk              9.6        7.1           3X Average Risk             23.4       11.0 Use the calculated Patient Ratio above and the CHD Risk table  to determine the patient's CHD Risk. ATP III Classification (LDL):       < 100        mg/dL         Optimal      100 - 129     mg/dL         Near or Above Optimal      130 - 159     mg/dL         Borderline High      160 - 189     mg/dL         High       > 190        mg/dL         Very High    CBC with Differential     Status: Abnormal   Collection Time: 05/17/14  1:55 PM  Result Value Ref Range   WBC 9.5 4.0 - 10.5 K/uL   RBC 5.89 (H) 3.87 - 5.11 MIL/uL   Hemoglobin 13.3 12.0 - 15.0 g/dL   HCT 40.6 36.0 - 46.0 %   MCV 68.9 (L) 78.0 - 100.0 fL   MCH 22.6 (L) 26.0 - 34.0 pg   MCHC 32.8 30.0 - 36.0 g/dL   RDW 14.3 11.5 - 15.5 %   Platelets 354 150 - 400 K/uL   Neutrophils Relative % 70 43 - 77 %   Lymphocytes Relative 22 12 - 46 %   Monocytes Relative 8 3 - 12 %   Eosinophils Relative 0 0 - 5 %   Basophils Relative 0 0 - 1 %   Neutro Abs 6.6 1.7 - 7.7 K/uL   Lymphs Abs 2.1 0.7 - 4.0 K/uL   Monocytes Absolute  0.8 0.1 - 1.0 K/uL   Eosinophils Absolute 0.0 0.0 - 0.7 K/uL   Basophils Absolute 0.0 0.0 - 0.1 K/uL   WBC Morphology ATYPICAL LYMPHOCYTES    Smear Review      PLATELET CLUMPS NOTED ON SMEAR, COUNT APPEARS ADEQUATE  Comprehensive metabolic  panel     Status: Abnormal   Collection Time: 05/17/14  1:55 PM  Result Value Ref Range   Sodium 142 137 - 147 mEq/L   Potassium 3.8 3.7 - 5.3 mEq/L   Chloride 97 96 - 112 mEq/L   CO2 21 19 - 32 mEq/L   Glucose, Bld 107 (H) 70 - 99 mg/dL   BUN 33 (H) 6 - 23 mg/dL   Creatinine, Ser 1.31 (H) 0.50 - 1.10 mg/dL   Calcium 10.1 8.4 - 10.5 mg/dL   Total Protein 9.2 (H) 6.0 - 8.3 g/dL   Albumin 4.9 3.5 - 5.2 g/dL   AST 20 0 - 37 U/L   ALT 34 0 - 35 U/L   Alkaline Phosphatase 105 39 - 117 U/L   Total Bilirubin 0.3 0.3 - 1.2 mg/dL   GFR calc non Af Amer 46 (L) >90 mL/min   GFR calc Af Amer 53 (L) >90 mL/min    Comment: (NOTE) The eGFR has been calculated using the CKD EPI equation. This calculation has not been validated in all clinical situations. eGFR's persistently <90 mL/min signify possible Chronic Kidney Disease.    Anion gap 24 (H) 5 - 15  Lipase, blood     Status: None   Collection Time: 05/17/14  1:55 PM  Result Value Ref Range   Lipase 14 11 - 59 U/L  Basic metabolic panel     Status: Abnormal   Collection Time: 05/17/14  5:26 PM  Result Value Ref Range   Sodium 145 137 - 147 mEq/L   Potassium 3.7 3.7 - 5.3 mEq/L   Chloride 105 96 - 112 mEq/L   CO2 22 19 - 32 mEq/L   Glucose, Bld 89 70 - 99 mg/dL   BUN 34 (H) 6 - 23 mg/dL   Creatinine, Ser 1.32 (H) 0.50 - 1.10 mg/dL   Calcium 8.2 (L) 8.4 - 10.5 mg/dL   GFR calc non Af Amer 45 (L) >90 mL/min   GFR calc Af Amer 53 (L) >90 mL/min    Comment: (NOTE) The eGFR has been calculated using the CKD EPI equation. This calculation has not been validated in all clinical situations. eGFR's persistently <90 mL/min signify possible Chronic Kidney Disease.    Anion gap 18 (H) 5 - 15  Urinalysis, Routine w reflex microscopic     Status: Abnormal   Collection Time: 05/17/14  7:43 PM  Result Value Ref Range   Color, Urine YELLOW YELLOW   APPearance CLOUDY (A) CLEAR   Specific Gravity, Urine 1.020 1.005 - 1.030   pH 5.0 5.0 - 8.0    Glucose, UA NEGATIVE NEGATIVE mg/dL   Hgb urine dipstick TRACE (A) NEGATIVE   Bilirubin Urine NEGATIVE NEGATIVE   Ketones, ur 15 (A) NEGATIVE mg/dL   Protein, ur 30 (A) NEGATIVE mg/dL   Urobilinogen, UA 0.2 0.0 - 1.0 mg/dL   Nitrite NEGATIVE NEGATIVE   Leukocytes, UA NEGATIVE NEGATIVE  Urine microscopic-add on     Status: Abnormal   Collection Time: 05/17/14  7:43 PM  Result Value Ref Range   Squamous Epithelial / LPF FEW (A) RARE   WBC, UA 3-6 <3 WBC/hpf   RBC / HPF 0-2 <  3 RBC/hpf   Bacteria, UA MANY (A) RARE   Casts HYALINE CASTS (A) NEGATIVE   Crystals CA OXALATE CRYSTALS (A) NEGATIVE  Urine culture     Status: None   Collection Time: 05/17/14  7:43 PM  Result Value Ref Range   Specimen Description URINE, CLEAN CATCH    Special Requests ADDED 2117    Culture  Setup Time      05/18/2014 09:56 Performed at [facility]      30,000 COLONIES/ML Performed at [facility]    Culture      Multiple bacterial morphotypes present, none predominant. Suggest appropriate recollection if clinically indicated. Performed at [facility]    Report Status 05/19/2014 FINAL   Ethanol     Status: None   Collection Time: 06/12/14  2:33 AM  Result Value Ref Range   Alcohol, Ethyl (B) <11 0 - 11 mg/dL    Comment:        LOWEST DETECTABLE LIMIT FOR SERUM ALCOHOL IS 11 mg/dL FOR MEDICAL PURPOSES ONLY   Protime-INR     Status: None   Collection Time: 06/12/14  2:33 AM  Result Value Ref Range   Prothrombin Time 12.9 11.6 - 15.2 seconds   INR 0.96 0.00 - 1.49  APTT     Status: None   Collection Time: 06/12/14  2:33 AM  Result Value Ref Range   aPTT 30 24 - 37 seconds  CBC     Status: Abnormal   Collection Time: 06/12/14  2:33 AM  Result Value Ref Range   WBC 7.3 4.0 - 10.5 K/uL   RBC 4.53 3.87 - 5.11 MIL/uL   Hemoglobin 10.3 (L) 12.0 - 15.0 g/dL   HCT 32.3 (L) 36.0 - 46.0 %   MCV 71.3 (L) 78.0 - 100.0 fL   MCH 22.7 (L) 26.0 - 34.0 pg   MCHC  31.9 30.0 - 36.0 g/dL   RDW 14.0 11.5 - 15.5 %   Platelets 290 150 - 400 K/uL  Differential     Status: Abnormal   Collection Time: 06/12/14  2:33 AM  Result Value Ref Range   Neutrophils Relative % 44 43 - 77 %   Lymphocytes Relative 48 (H) 12 - 46 %   Monocytes Relative 6 3 - 12 %   Eosinophils Relative 2 0 - 5 %   Basophils Relative 0 0 - 1 %   Neutro Abs 3.2 1.7 - 7.7 K/uL   Lymphs Abs 3.6 0.7 - 4.0 K/uL   Monocytes Absolute 0.4 0.1 - 1.0 K/uL   Eosinophils Absolute 0.1 0.0 - 0.7 K/uL   Basophils Absolute 0.0 0.0 - 0.1 K/uL   RBC Morphology TARGET CELLS   Comprehensive metabolic panel     Status: Abnormal   Collection Time: 06/12/14  2:33 AM  Result Value Ref Range   Sodium 142 137 - 147 mEq/L   Potassium 4.1 3.7 - 5.3 mEq/L   Chloride 103 96 - 112 mEq/L   CO2 25 19 - 32 mEq/L   Glucose, Bld 93 70 - 99 mg/dL   BUN 17 6 - 23 mg/dL   Creatinine, Ser 0.76 0.50 - 1.10 mg/dL   Calcium 9.4 8.4 - 10.5 mg/dL   Total Protein 7.1 6.0 - 8.3 g/dL   Albumin 3.6 3.5 - 5.2 g/dL   AST 25 0 - 37 U/L   ALT 25 0 - 35 U/L   Alkaline Phosphatase 90 39 - 117  U/L   Total Bilirubin <0.2 (L) 0.3 - 1.2 mg/dL   GFR calc non Af Amer >90 >90 mL/min   GFR calc Af Amer >90 >90 mL/min    Comment: (NOTE) The eGFR has been calculated using the CKD EPI equation. This calculation has not been validated in all clinical situations. eGFR's persistently <90 mL/min signify possible Chronic Kidney Disease.    Anion gap 14 5 - 15  Hemoglobin A1c     Status: Abnormal   Collection Time: 06/12/14  2:33 AM  Result Value Ref Range   Hgb A1c MFr Bld 5.8 (H) <5.7 %    Comment: (NOTE)                                                                       According to the ADA Clinical Practice Recommendations for 2011, when HbA1c is used as a screening test:  >=6.5%   Diagnostic of Diabetes Mellitus           (if abnormal result is confirmed) 5.7-6.4%   Increased risk of developing Diabetes  [place] References:Diagnosis and Classification of Diabetes Mellitus,Diabetes VVZS,8270,78(MLJQG 1):S62-S69 and Standards of Medical Care in         Diabetes - 2011,Diabetes Care,2011,34 (Suppl 1):S11-S61.    Mean Plasma Glucose 120 (H) <117 mg/dL    Comment: Performed at [facility] troponin, ED (not at [facility])     Status: None   Collection Time: 06/12/14  2:47 AM  Result Value Ref Range   Troponin i, poc 0.00 0.00 - 0.08 ng/mL   Comment 3            Comment: Due to the release kinetics of cTnI, a negative result within the first hours of the onset of symptoms does not rule out myocardial infarction with certainty. If myocardial infarction is still suspected, repeat the test at appropriate intervals.   Urine Drug Screen     Status: Abnormal   Collection Time: 06/12/14  2:47 AM  Result Value Ref Range   Opiates NONE DETECTED NONE DETECTED   Cocaine POSITIVE (A) NONE DETECTED   Benzodiazepines NONE DETECTED NONE DETECTED   Amphetamines NONE DETECTED NONE DETECTED   Tetrahydrocannabinol NONE DETECTED NONE DETECTED   Barbiturates NONE DETECTED NONE DETECTED    Comment:        DRUG SCREEN FOR MEDICAL PURPOSES ONLY.  IF CONFIRMATION IS NEEDED FOR ANY PURPOSE, NOTIFY LAB WITHIN 5 DAYS.        LOWEST DETECTABLE LIMITS FOR URINE DRUG SCREEN Drug Class       Cutoff (ng/mL) Amphetamine      1000 Barbiturate      200 Benzodiazepine   920 Tricyclics       100 Opiates          300 Cocaine          300 THC              50   Urinalysis, Routine w reflex microscopic     Status: None   Collection Time: 06/12/14  2:47 AM  Result Value Ref Range   Color, Urine YELLOW YELLOW   APPearance CLEAR CLEAR   Specific Gravity, Urine 1.022 1.005 - 1.030   pH  5.0 5.0 - 8.0   Glucose, UA NEGATIVE NEGATIVE mg/dL   Hgb urine dipstick NEGATIVE NEGATIVE   Bilirubin Urine NEGATIVE NEGATIVE   Ketones, ur NEGATIVE NEGATIVE mg/dL   Protein, ur NEGATIVE NEGATIVE mg/dL   Urobilinogen,  UA 0.2 0.0 - 1.0 mg/dL   Nitrite NEGATIVE NEGATIVE   Leukocytes, UA NEGATIVE NEGATIVE    Comment: MICROSCOPIC NOT DONE ON URINES WITH NEGATIVE PROTEIN, BLOOD, LEUKOCYTES, NITRITE, OR GLUCOSE <1000 mg/dL.  TSH     Status: None   Collection Time: 06/12/14  7:41 AM  Result Value Ref Range   TSH 0.965 0.350 - 4.500 uIU/mL  Glucose, capillary     Status: Abnormal   Collection Time: 06/12/14  7:54 AM  Result Value Ref Range   Glucose-Capillary 104 (H) 70 - 99 mg/dL  Glucose, capillary     Status: Abnormal   Collection Time: 06/12/14 11:39 AM  Result Value Ref Range   Glucose-Capillary 113 (H) 70 - 99 mg/dL  Glucose, capillary     Status: Abnormal   Collection Time: 06/12/14  6:59 PM  Result Value Ref Range   Glucose-Capillary 113 (H) 70 - 99 mg/dL  Glucose, capillary     Status: Abnormal   Collection Time: 06/12/14  9:38 PM  Result Value Ref Range   Glucose-Capillary 121 (H) 70 - 99 mg/dL  CBC     Status: Abnormal   Collection Time: 06/13/14  3:03 AM  Result Value Ref Range   WBC 5.3 4.0 - 10.5 K/uL   RBC 4.37 3.87 - 5.11 MIL/uL   Hemoglobin 9.4 (L) 12.0 - 15.0 g/dL   HCT 31.0 (L) 36.0 - 46.0 %   MCV 70.9 (L) 78.0 - 100.0 fL   MCH 21.5 (L) 26.0 - 34.0 pg   MCHC 30.3 30.0 - 36.0 g/dL   RDW 13.9 11.5 - 15.5 %   Platelets 274 150 - 400 K/uL  Basic metabolic panel     Status: Abnormal   Collection Time: 06/13/14  3:03 AM  Result Value Ref Range   Sodium 142 137 - 147 mEq/L   Potassium 4.0 3.7 - 5.3 mEq/L   Chloride 103 96 - 112 mEq/L   CO2 25 19 - 32 mEq/L   Glucose, Bld 97 70 - 99 mg/dL   BUN 18 6 - 23 mg/dL   Creatinine, Ser 0.80 0.50 - 1.10 mg/dL   Calcium 9.4 8.4 - 10.5 mg/dL   GFR calc non Af Amer 83 (L) >90 mL/min   GFR calc Af Amer >90 >90 mL/min    Comment: (NOTE) The eGFR has been calculated using the CKD EPI equation. This calculation has not been validated in all clinical situations. eGFR's persistently <90 mL/min signify possible Chronic Kidney Disease.     Anion gap 14 5 - 15  Glucose, capillary     Status: Abnormal   Collection Time: 06/13/14  8:27 AM  Result Value Ref Range   Glucose-Capillary 132 (H) 70 - 99 mg/dL   Comment 1 Notify RN   Glucose, capillary     Status: None   Collection Time: 06/13/14 12:01 PM  Result Value Ref Range   Glucose-Capillary 96 70 - 99 mg/dL   Comment 1 Notify RN   Glucose, capillary     Status: Abnormal   Collection Time: 06/13/14  4:40 PM  Result Value Ref Range   Glucose-Capillary 144 (H) 70 - 99 mg/dL   Comment 1 Notify RN   Glucose, capillary     Status:  Abnormal   Collection Time: 06/13/14  9:09 PM  Result Value Ref Range   Glucose-Capillary 138 (H) 70 - 99 mg/dL   Comment 1 Notify RN   CBC     Status: Abnormal   Collection Time: 06/14/14  3:46 AM  Result Value Ref Range   WBC 5.0 4.0 - 10.5 K/uL   RBC 4.24 3.87 - 5.11 MIL/uL   Hemoglobin 9.5 (L) 12.0 - 15.0 g/dL   HCT 31.0 (L) 36.0 - 46.0 %   MCV 73.1 (L) 78.0 - 100.0 fL   MCH 22.4 (L) 26.0 - 34.0 pg   MCHC 30.6 30.0 - 36.0 g/dL   RDW 14.3 11.5 - 15.5 %   Platelets 275 150 - 400 K/uL  Basic metabolic panel     Status: Abnormal   Collection Time: 06/14/14  3:46 AM  Result Value Ref Range   Sodium 142 137 - 147 mEq/L   Potassium 4.0 3.7 - 5.3 mEq/L   Chloride 105 96 - 112 mEq/L   CO2 25 19 - 32 mEq/L   Glucose, Bld 107 (H) 70 - 99 mg/dL   BUN 18 6 - 23 mg/dL   Creatinine, Ser 0.77 0.50 - 1.10 mg/dL   Calcium 9.3 8.4 - 10.5 mg/dL   GFR calc non Af Amer >90 >90 mL/min   GFR calc Af Amer >90 >90 mL/min    Comment: (NOTE) The eGFR has been calculated using the CKD EPI equation. This calculation has not been validated in all clinical situations. eGFR's persistently <90 mL/min signify possible Chronic Kidney Disease.    Anion gap 12 5 - 15  Glucose, capillary     Status: None   Collection Time: 06/14/14  7:41 AM  Result Value Ref Range   Glucose-Capillary 88 70 - 99 mg/dL   Comment 1 Notify RN      Signed: Jacques Earthly,  MD 06/15/2014, 2:20 PM    Services Ordered on Discharge: None Equipment Ordered on Discharge: None

## 2014-06-17 ENCOUNTER — Other Ambulatory Visit: Payer: Self-pay | Admitting: Internal Medicine

## 2014-06-17 MED ORDER — OXYCODONE-ACETAMINOPHEN 5-325 MG PO TABS: 1.0000 | ORAL_TABLET | Freq: Every evening | ORAL | Status: DC | PRN

## 2014-06-22 ENCOUNTER — Encounter: Payer: No Typology Code available for payment source | Admitting: Internal Medicine

## 2014-06-22 ENCOUNTER — Ambulatory Visit: Payer: No Typology Code available for payment source | Admitting: Internal Medicine

## 2014-06-29 ENCOUNTER — Ambulatory Visit: Payer: No Typology Code available for payment source | Admitting: Internal Medicine

## 2014-07-06 NOTE — Telephone Encounter (Signed)
A user error has taken place: encounter opened in error, closed for administrative reasons.[name], [name] Cassady1/11/20163:20 PM  .

## 2014-07-16 ENCOUNTER — Other Ambulatory Visit: Payer: Self-pay | Admitting: *Deleted

## 2014-07-20 MED ORDER — OXYCODONE-ACETAMINOPHEN 5-325 MG PO TABS: 1.0000 | ORAL_TABLET | Freq: Every evening | ORAL | Status: DC | PRN

## 2014-07-20 NOTE — Telephone Encounter (Signed)
Message left on pt's recorder, rx ready for pickup and to be sure to keep Feb appt.Despina Hidden Cassady1/25/20163:55 PM

## 2014-07-20 NOTE — Telephone Encounter (Signed)
Has appt Feb to discuss need for opioid.

## 2014-08-06 ENCOUNTER — Encounter: Payer: Self-pay | Admitting: Internal Medicine

## 2014-08-18 ENCOUNTER — Other Ambulatory Visit: Payer: Self-pay | Admitting: *Deleted

## 2014-08-18 MED ORDER — OXYCODONE-ACETAMINOPHEN 5-325 MG PO TABS: 1.0000 | ORAL_TABLET | Freq: Every evening | ORAL | Status: DC | PRN

## 2014-08-18 NOTE — Telephone Encounter (Signed)
Pt was scheduled to see pcp on Feb 11th, but office had to reschedule appt to March 3rd.  Pt requesting refill and will f/u on March 3rd.Regenia Skeeter, Koy Lamp Cassady2/23/201611:10 AM

## 2014-08-18 NOTE — Telephone Encounter (Signed)
Using about 1 per day. Will refill

## 2014-08-18 NOTE — Telephone Encounter (Signed)
Attempted to contact pt at number in chart, mailbox full-unable to leave message.[name], Ruth Mccutchan Cassady2/23/201611:26 AM

## 2014-08-19 NOTE — Telephone Encounter (Signed)
Printed on 23rd and handed to [place]

## 2014-08-20 NOTE — Telephone Encounter (Signed)
Pt informed will pick up 2/26

## 2014-08-27 ENCOUNTER — Encounter: Payer: Self-pay | Admitting: Internal Medicine

## 2014-10-23 ENCOUNTER — Ambulatory Visit: Payer: Self-pay

## 2014-10-23 ENCOUNTER — Other Ambulatory Visit: Payer: Self-pay | Admitting: *Deleted

## 2014-10-23 MED ORDER — HYDROCODONE-ACETAMINOPHEN 5-325 MG PO TABS: 1.0000 | ORAL_TABLET | Freq: Four times a day (QID) | ORAL | Status: DC | PRN

## 2014-10-23 NOTE — Telephone Encounter (Signed)
Scheduled appointment 6/2 with PCP Meds for foot pain Last refill 05/02/14 Pt here seeing D [facility]

## 2014-10-23 NOTE — Telephone Encounter (Signed)
This will be the last RF. Not appropriate for plantar fascitis. Sports med wanted her to return to cont to address the pain and refer for PF rehab.

## 2014-10-23 NOTE — Telephone Encounter (Signed)
Pt was informed of dr butcher's message, pt states she is not going to "that quack anymore", states sports medicine sent her to ortho doctor and he talked" to me like a speck of dirt on the floor", states he did not want to help her. She states she will speak to dr Lynnae January June 2

## 2014-11-26 ENCOUNTER — Encounter: Payer: Self-pay | Admitting: Internal Medicine

## 2014-11-30 ENCOUNTER — Ambulatory Visit: Payer: Self-pay

## 2014-12-09 ENCOUNTER — Telehealth: Payer: Self-pay | Admitting: Internal Medicine

## 2014-12-09 NOTE — Telephone Encounter (Signed)
Call to patient to confirm appointment for 6/16 at 9:00 lmtcb

## 2014-12-10 ENCOUNTER — Ambulatory Visit: Payer: Self-pay

## 2014-12-10 ENCOUNTER — Encounter: Payer: Self-pay | Admitting: Internal Medicine

## 2014-12-15 ENCOUNTER — Ambulatory Visit (INDEPENDENT_AMBULATORY_CARE_PROVIDER_SITE_OTHER): Payer: Self-pay | Admitting: *Deleted

## 2014-12-15 DIAGNOSIS — Z0000 Encounter for general adult medical examination without abnormal findings: Secondary | ICD-10-CM

## 2014-12-17 ENCOUNTER — Encounter: Payer: Self-pay | Admitting: Internal Medicine

## 2014-12-17 ENCOUNTER — Ambulatory Visit (INDEPENDENT_AMBULATORY_CARE_PROVIDER_SITE_OTHER): Payer: Self-pay | Admitting: Internal Medicine

## 2014-12-17 VITALS — BP 160/90 | HR 80 | Temp 98.1°F | Ht 59.0 in | Wt 154.7 lb

## 2014-12-17 DIAGNOSIS — F17200 Nicotine dependence, unspecified, uncomplicated: Secondary | ICD-10-CM

## 2014-12-17 DIAGNOSIS — Z111 Encounter for screening for respiratory tuberculosis: Secondary | ICD-10-CM

## 2014-12-17 DIAGNOSIS — Z8673 Personal history of transient ischemic attack (TIA), and cerebral infarction without residual deficits: Secondary | ICD-10-CM

## 2014-12-17 DIAGNOSIS — D649 Anemia, unspecified: Secondary | ICD-10-CM

## 2014-12-17 DIAGNOSIS — I10 Essential (primary) hypertension: Secondary | ICD-10-CM

## 2014-12-17 DIAGNOSIS — Z0000 Encounter for general adult medical examination without abnormal findings: Secondary | ICD-10-CM

## 2014-12-17 DIAGNOSIS — M25559 Pain in unspecified hip: Secondary | ICD-10-CM | POA: Insufficient documentation

## 2014-12-17 DIAGNOSIS — K219 Gastro-esophageal reflux disease without esophagitis: Secondary | ICD-10-CM

## 2014-12-17 DIAGNOSIS — M722 Plantar fascial fibromatosis: Secondary | ICD-10-CM

## 2014-12-17 DIAGNOSIS — E785 Hyperlipidemia, unspecified: Secondary | ICD-10-CM

## 2014-12-17 DIAGNOSIS — G459 Transient cerebral ischemic attack, unspecified: Secondary | ICD-10-CM

## 2014-12-17 DIAGNOSIS — M7062 Trochanteric bursitis, left hip: Secondary | ICD-10-CM

## 2014-12-17 LAB — TB SKIN TEST: TB Skin Test: NEGATIVE

## 2014-12-17 MED ORDER — LISINOPRIL 20 MG PO TABS: 20.0000 mg | ORAL_TABLET | Freq: Every day | ORAL | Status: DC

## 2014-12-17 NOTE — Progress Notes (Signed)
   Subjective:    Patient ID: [name], female    DOB: 1960/07/26, 54 y.o.   MRN: [id number]  HPI  [name] is here for HTN F/U. Please see the A&P for the status of the pt's chronic medical problems.  Review of Systems  Constitutional: Negative for unexpected weight change.  Cardiovascular: Negative for leg swelling.  Gastrointestinal:       Stomach uneasy after addition of HCTZ  Musculoskeletal:       R ankle / foot pain. L hip pain. Occ sharp shooting pain L shoulder but better.  Skin: Negative for wound.  Neurological: Positive for headaches.  Psychiatric/Behavioral: Negative for sleep disturbance.       Objective:   Physical Exam  Constitutional: She appears well-developed and well-nourished. No distress.  HENT:  Head: Normocephalic and atraumatic.  Right Ear: External ear normal.  Left Ear: External ear normal.  Nose: Nose normal.  Eyes: Conjunctivae and EOM are normal.  Cardiovascular: Normal rate, regular rhythm and normal heart sounds.   Pulmonary/Chest: Effort normal and breath sounds normal.  Musculoskeletal: Normal range of motion. She exhibits no edema.  Neurological: She is alert.  Skin: Skin is warm and dry. She is not diaphoretic.  Psychiatric: She has a normal mood and affect. Her behavior is normal. Judgment and thought content normal.          Assessment & Plan:

## 2014-12-17 NOTE — Assessment & Plan Note (Signed)
Statin was stopped 2/2 cost. Will need to recheck at next appt in 2-6 weeks. She had no SE to the med. 10 yr risk is 23%. Would benefit from statin even though primary prevention (TIA not clear dx).

## 2014-12-17 NOTE — Assessment & Plan Note (Signed)
L hip pain. Cannot lay on that side. Point tenderness over the bursa. Trial of tylenol and NSIAD and to sit with weight evenly distributed and not leaning on the L. If no better, sports med.

## 2014-12-17 NOTE — Assessment & Plan Note (Signed)
1/2 PPD. Hopes that her smoking will decrease once she starts new job.

## 2014-12-17 NOTE — Assessment & Plan Note (Signed)
On FeSO4 325 QD. Ferritin increased from 10 in 2008 to 121 in Jan 2015. HgB stable. Will repeat CBC and ferritin next appt.
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
## 2014-12-17 NOTE — Assessment & Plan Note (Signed)
On PPI QD and controlling sxs well. Cont med.

## 2014-12-17 NOTE — Assessment & Plan Note (Signed)
Agree that ASA 81 QD OK for her to take.

## 2014-12-17 NOTE — Assessment & Plan Note (Signed)
The L foot is better after steroid injection. The R also had an injection but is no better. Pain is on lateral heel. I gave info on exercises and ice. To trial NSAID (with food) and tylenol. If no better, sports med or podiatry.

## 2014-12-17 NOTE — Assessment & Plan Note (Signed)
BP Readings from Last 3 Encounters:  12/17/14 160/90  06/14/14 133/87  05/17/14 108/65   On Lisinopril 20 and HCTZ 25. Checks BP at home and 150. Has decreased salt intake. Getting HA. Under stress bc no job but starting new job and excited.   Assessment : uncontrolled HTN  PLAN : cont lisinopril HCTZ 20 / 25. She will try to move it to after breakfast as the HCTZ part is causing GI upset. Add lisinopril 20 to dose, either AM or PM - up to pt. Repeat BP in 2-6 weeks.

## 2014-12-17 NOTE — Patient Instructions (Signed)
1. For your hip - try tylenol 1000 mg 2 to 4 times a day  Ibuprofen 400 mg 2 to 4 times a day with food or aleve  Try heating pad  No pressure on the hip 2. This will help your foot also 3. Continue the blood pressure 20/25. Start the 20 once a day 4. See me in 2-5 weeks for blood pressure check

## 2014-12-29 ENCOUNTER — Telehealth: Payer: Self-pay | Admitting: Internal Medicine

## 2014-12-29 NOTE — Telephone Encounter (Signed)
Called about having a headache, asked if she could take tylenol. Told her this was okay. She will call back if this does not improve.   Natasha Bence, MD PGY-3, Internal Medicine Pager: [phone]

## 2014-12-29 NOTE — Telephone Encounter (Signed)
Patient calling stating her blood pressure was 97/59. Wants to know what she should do.

## 2014-12-29 NOTE — Telephone Encounter (Signed)
Pt called with c/o: Dr changed her BP med on 6/23.  Yesterday BP  Starting dropping. Yesterday BP reading was  100/59   Today BP is lower at 97/51 and a repeat after 20 minutes of  94/49  She feels dizzy and has a headache.   Diet is good but appetite is down. Drinking okay She is taking lisinopril 20 mg daily and also taking lisinopril/hctz 20/25 mg. meds taken together in AM   Pt # [phone]

## 2014-12-29 NOTE — Telephone Encounter (Signed)
Felt great. Ate something last PM and had diarrhea. Felt great this AM but now dizzy, HA, and low BP.  1. Told pt to stop both HCTZ and lisinopril 2. Pls call her with Friday appt - can come in that day.

## 2014-12-29 NOTE — Telephone Encounter (Signed)
Will see Friday 7/8 at 10:45

## 2014-12-31 ENCOUNTER — Telehealth: Payer: Self-pay | Admitting: Internal Medicine

## 2014-12-31 NOTE — Telephone Encounter (Signed)
Call to patient to confirm appointment for 01/01/15 at 10:45 lmtcb

## 2015-01-01 ENCOUNTER — Ambulatory Visit: Payer: Self-pay | Admitting: Internal Medicine

## 2015-03-07 ENCOUNTER — Emergency Department (HOSPITAL_COMMUNITY)
Admission: EM | Admit: 2015-03-07 | Discharge: 2015-03-07 | Discharge status: Against Medical Advice | Payer: Self-pay | Attending: Emergency Medicine | Admitting: Emergency Medicine

## 2015-03-07 ENCOUNTER — Encounter (HOSPITAL_COMMUNITY): Payer: Self-pay | Admitting: Emergency Medicine

## 2015-03-07 DIAGNOSIS — I10 Essential (primary) hypertension: Secondary | ICD-10-CM | POA: Insufficient documentation

## 2015-03-07 DIAGNOSIS — R197 Diarrhea, unspecified: Secondary | ICD-10-CM | POA: Insufficient documentation

## 2015-03-07 DIAGNOSIS — R51 Headache: Secondary | ICD-10-CM | POA: Insufficient documentation

## 2015-03-07 DIAGNOSIS — Z5321 Procedure and treatment not carried out due to patient leaving prior to being seen by health care provider: Secondary | ICD-10-CM

## 2015-03-07 DIAGNOSIS — Z720 Tobacco use: Secondary | ICD-10-CM | POA: Insufficient documentation

## 2015-03-07 NOTE — ED Notes (Signed)
Pt. Stated, I've been unable to eat cause of diarrhea, I've got a headache, and abdominal cramping

## 2015-03-07 NOTE — ED Provider Notes (Signed)
I signed up for this patient but she was then taken out of the system. I spoke with Agricultural consultant and reviewed notes. It is unclear why this patient has left but she is no longer listed as being at the hospital. Will remove name,  I never saw or interacted with this patient.  Delos Haring, PA-C 03/07/15 3112  Leo Grosser, MD 03/07/15 2114

## 2015-03-10 ENCOUNTER — Emergency Department (HOSPITAL_COMMUNITY)
Admission: EM | Admit: 2015-03-10 | Discharge: 2015-03-10 | Discharge status: Against Medical Advice | Payer: Self-pay | Attending: Emergency Medicine | Admitting: Emergency Medicine

## 2015-03-10 ENCOUNTER — Encounter (HOSPITAL_COMMUNITY): Payer: Self-pay | Admitting: Neurology

## 2015-03-10 DIAGNOSIS — D649 Anemia, unspecified: Secondary | ICD-10-CM | POA: Insufficient documentation

## 2015-03-10 DIAGNOSIS — I10 Essential (primary) hypertension: Secondary | ICD-10-CM | POA: Insufficient documentation

## 2015-03-10 DIAGNOSIS — M1990 Unspecified osteoarthritis, unspecified site: Secondary | ICD-10-CM | POA: Insufficient documentation

## 2015-03-10 DIAGNOSIS — E785 Hyperlipidemia, unspecified: Secondary | ICD-10-CM | POA: Insufficient documentation

## 2015-03-10 DIAGNOSIS — R011 Cardiac murmur, unspecified: Secondary | ICD-10-CM | POA: Insufficient documentation

## 2015-03-10 DIAGNOSIS — Z720 Tobacco use: Secondary | ICD-10-CM | POA: Insufficient documentation

## 2015-03-10 DIAGNOSIS — Z79899 Other long term (current) drug therapy: Secondary | ICD-10-CM | POA: Insufficient documentation

## 2015-03-10 DIAGNOSIS — K219 Gastro-esophageal reflux disease without esophagitis: Secondary | ICD-10-CM | POA: Insufficient documentation

## 2015-03-10 DIAGNOSIS — R040 Epistaxis: Secondary | ICD-10-CM | POA: Insufficient documentation

## 2015-03-10 DIAGNOSIS — R51 Headache: Secondary | ICD-10-CM | POA: Insufficient documentation

## 2015-03-10 NOTE — ED Notes (Signed)
Pt reports she does not want to wait for the doctor and wants to leave. Pt dressed and ready to go upon this RN entering room. This R encouraged patient to stay to be evaluated by MD but pt refusing and states she will follow up with her PCP. This RN informed her to come back to be seen if bleeding occurs again and if she feels weak, lightheaded or passes out. Pt observed ambulating out of ED with steady gait, NAD. Dr. Lenna Sciara informed.

## 2015-03-10 NOTE — ED Notes (Signed)
Pt reports nosebleed this morning, she was talking with her friend when it started. Over the weekend was sick with stomach bug. No blood thinners. No bleeding at current.

## 2015-03-10 NOTE — ED Provider Notes (Signed)
CSN: [id number]     Arrival date & time 03/10/15  1103 History   First MD Initiated Contact with Patient 03/10/15 1136     Chief Complaint  Patient presents with  . Epistaxis    HPI  [name] is a 54 y.o. female with complaints of acute epistaxis this morning. States that she woke up this morning to go to work like normal but felt different. When she arrived at work she was feeling off balance and confused. Supervisor instructed patient to go home for the day. Patient states that when she went to her friend's house and upon arriving at her friend's house she had a nosebleed. She denies any trauma causing the nosebleed. She states that the blood was pouring out and she was choking due to clots forming. She had two large clots during this episode. She states that she tried to hold pressure on her nose initially without cessation of bleeding. She then decided to drive herself to the ED holding pressure on her nose. She states that when she arrived at the ED bleeding had stopped. She does state that she has had nosebleeds before. Last episode was several years ago. Patient does state that she has recently been sick and has just overcome a bug. During her illness she did have some nasal drainage for a couple days. Patient denies being on any blood thinners.  Past Medical History  Diagnosis Date  . Hyperlipidemia   . Hiatal hernia   . GERD (gastroesophageal reflux disease)   . Hypertension   . Anemia   . Arthritis   . Plantar fasciitis    Past Surgical History  Procedure Laterality Date  . Abdominal hysterectomy  12/09    Secondary to fibroids  . Cesarean section     Family History  Problem Relation Age of Onset  . Cancer Mother 60    pancreatic cancer  . Pancreatic cancer Mother   . Cancer Father 58    throat cancer  . Cancer Maternal Aunt 60    breast cancer  . Cancer Other     Died  from Colon cancer in 51's  . Colon cancer Maternal Uncle   . Colon cancer Paternal Uncle     Social History  Substance Use Topics  . Smoking status: Current Every Day Smoker -- 0.10 packs/day    Types: Cigarettes  . Smokeless tobacco: Never Used     Comment: Down to 1-2 cig/day.  . Alcohol Use: Yes     Comment: occasionally   OB History    No data available     Review of Systems  Constitutional: Negative for fever.  HENT: Positive for nosebleeds.   Neurological: Positive for headaches.  Also in HPI  Allergies  Aspirin and Hydromorphone hcl  Home Medications   Prior to Admission medications   Medication Sig Start Date End Date Taking? Authorizing Provider  atorvastatin (LIPITOR) 40 MG tablet Take 1 tablet (40 mg total) by mouth daily at 6 PM. Patient not taking: Reported on 12/17/2014 06/14/14   Milagros Loll, MD  ferrous sulfate (IRON SUPPLEMENT) 325 (65 FE) MG tablet Take 325 mg by mouth daily with breakfast.     Historical Provider, MD  ibuprofen (ADVIL,MOTRIN) 800 MG tablet Take 1 tablet (800 mg total) by mouth every 8 (eight) hours as needed for headache or moderate pain. Patient not taking: Reported on 12/17/2014 06/14/14   Milagros Loll, MD  lisinopril (PRINIVIL,ZESTRIL) 20 MG tablet Take 1 tablet (20  mg total) by mouth daily. 12/17/14   Bartholomew Crews, MD  lisinopril-hydrochlorothiazide (PRINZIDE,ZESTORETIC) 20-25 MG per tablet Take 1 tablet by mouth daily. 05/13/14   Bartholomew Crews, MD  omeprazole (PRILOSEC) 20 MG capsule Take 1 capsule (20 mg total) by mouth daily. 05/19/13   Bartholomew Crews, MD   BP 118/88 mmHg  Pulse 88  Temp(Src) 98.3 F (36.8 C) (Oral)  Resp 16  Ht 4\' 11"  (1.499 m)  Wt 150 lb (68.04 kg)  BMI 30.28 kg/m2  SpO2 98% Physical Exam  Constitutional: She appears well-developed and well-nourished. No distress.  HENT:  Nose: No nasal deformity. No epistaxis.  Mouth/Throat: Oropharynx is clear and moist and mucous membranes are normal.  Kiesselbach's plexus prominent bilaterally  Eyes: Pupils are equal, round, and  reactive to light.  Cardiovascular: Normal rate and regular rhythm.   Murmur heard. Pulmonary/Chest: Effort normal and breath sounds normal.  Neurological: She is alert.  Skin: Skin is warm and dry.  Psychiatric: She has a normal mood and affect.    ED Course  Procedures (including critical care time) Labs Review Labs Reviewed - No data to display  Imaging Review No results found. I have personally reviewed and evaluated these images and lab results as part of my medical decision-making.   EKG Interpretation None     MDM   Final diagnoses:  None   Pateitn presented to the ED with epistaxis that resolved upon arrival. Patient however left before medical work-up could be completed. Patient was seen and stable at that time. No active bleeding observed.   Luiz Blare, DO 03/10/2015, 1:15 PM PGY-2, [place], DO 03/10/15 1512  Orlie Dakin, MD 03/10/15 [phone]

## 2015-03-10 NOTE — ED Provider Notes (Signed)
Patient left the ED before I could evaluate her without waiting for instruction  Orlie Dakin, MD 03/10/15 1737

## 2015-03-11 ENCOUNTER — Encounter: Payer: Self-pay | Admitting: Internal Medicine

## 2015-03-11 ENCOUNTER — Ambulatory Visit (INDEPENDENT_AMBULATORY_CARE_PROVIDER_SITE_OTHER): Payer: Self-pay | Admitting: Internal Medicine

## 2015-03-11 VITALS — BP 141/79 | HR 68 | Temp 98.2°F | Ht 59.0 in | Wt 139.9 lb

## 2015-03-11 DIAGNOSIS — R112 Nausea with vomiting, unspecified: Secondary | ICD-10-CM

## 2015-03-11 DIAGNOSIS — Z23 Encounter for immunization: Secondary | ICD-10-CM

## 2015-03-11 DIAGNOSIS — R040 Epistaxis: Secondary | ICD-10-CM

## 2015-03-11 MED ORDER — ONDANSETRON HCL 4 MG PO TABS: 4.0000 mg | ORAL_TABLET | Freq: Three times a day (TID) | ORAL | Status: DC | PRN

## 2015-03-11 NOTE — Progress Notes (Signed)
Temp recheck before leaving clinic 98.5. Had nausea x 2 with nose bled while in clinic. Ate saltines - feeling better. Hilda Blades Zadie Deemer RN 03/11/15 9:25AM

## 2015-03-11 NOTE — Progress Notes (Signed)
Internal Medicine Clinic Attending  Case discussed with Dr. Emokpae soon after the resident saw the patient.  We reviewed the resident's history and exam and pertinent patient test results.  I agree with the assessment, diagnosis, and plan of care documented in the resident's note. 

## 2015-03-11 NOTE — Assessment & Plan Note (Addendum)
Significant amount per pt, with clots, requiring ED visit. Pt left before blood work could be done. See H&P. Pt has a hx of cocaine use. UDS- 2015- was positive. Pt denies use recently but says she last used it 2 weeks ago. Most likely source of bleeding is anterior considering bleeding stopped after application of pressure to anterior nares.  Plan- CBC check - Advised pt to use humidifiers, and nasal sprays. - Refer to ENT- considering at this point we have no source of bleeding, and physical exam is unremarkable. - UDS - Work note - Also pt had an episode of dry heaving while here which resolved. No abdominal pain. Temp rechecked-( pt reported chills) -  check CMET- due to prior diarrhea and vomiting and Zofran- 4mg  Q8H.  Addendum- CBC reveals stable hgb, but pt appears to have a microcytic anemia, consider doing an anemia panel, last 06/2013- Ferritin WNL, only iron saturation was slightly low . Differentials thalassemia, if anemia panel is normal. If  suggestive of blood loss, then likely GI bleed as she has had a hysterectomy. Pt is on fe suplimentation, ?compliance,

## 2015-03-11 NOTE — Patient Instructions (Addendum)
Please for now use humidifiers and nasal sprays to keep your nose dry.  If your numbers come back abnormal on your blood test, I will let you know, we will refer you to a Nose doctor.  Please keep your appointment with your doctor on the 29th of this month.  I have also prescribed something for you to use for your nausea.    Nosebleed Nosebleeds can be caused by many conditions, including trauma, infections, polyps, foreign bodies, dry mucous membranes or climate, medicines, and air conditioning. Most nosebleeds occur in the front of the nose. Because of this location, most nosebleeds can be controlled by pinching the nostrils gently and continuously for at least 10 to 20 minutes. The long, continuous pressure allows enough time for the blood to clot. If pressure is released during that 10 to 20 minute time period, the process may have to be started again. The nosebleed may stop by itself or quit with pressure, or it may need concentrated heating (cautery) or pressure from packing. HOME CARE INSTRUCTIONS   If your nose was packed, try to maintain the pack inside until your health care provider removes it. If a gauze pack was used and it starts to fall out, gently replace it or cut the end off. Do not cut if a balloon catheter was used to pack the nose. Otherwise, do not remove unless instructed.  Avoid blowing your nose for 12 hours after treatment. This could dislodge the pack or clot and start the bleeding again.  If the bleeding starts again, sit up and bend forward, gently pinching the front half of your nose continuously for 20 minutes.  If bleeding was caused by dry mucous membranes, use over-the-counter saline nasal spray or gel. This will keep the mucous membranes moist and allow them to heal. If you must use a lubricant, choose the water-soluble variety. Use it only sparingly and not within several hours of lying down.  Do not use petroleum jelly or mineral oil, as these may drip into  the lungs and cause serious problems.  Maintain humidity in your home by using less air conditioning or by using a humidifier.  Do not use aspirin or medicines which make bleeding more likely. Your health care provider can give you recommendations on this.  Resume normal activities as you are able, but try to avoid straining, lifting, or bending at the waist for several days.  If the nosebleeds become recurrent and the cause is unknown, your health care provider may suggest laboratory tests. SEEK MEDICAL CARE IF: You have a fever. SEEK IMMEDIATE MEDICAL CARE IF:   Bleeding recurs and cannot be controlled.  There is unusual bleeding from or bruising on other parts of the body.  Nosebleeds continue.  There is any worsening of the condition which originally brought you in.  You become light-headed, feel faint, become sweaty, or vomit blood. MAKE SURE YOU:   Understand these instructions.  Will watch your condition.  Will get help right away if you are not doing well or get worse.

## 2015-03-11 NOTE — Progress Notes (Signed)
Patient ID: [name], female   DOB: [birth year], 54 y.o.   MRN: [id number]   Subjective:   Patient ID: [name] female   DOB: Jan 05, 1961 54 y.o.   MRN: [id number]  HPI: [name] is a 54 y.o. with PMH listed belwo. Presented today to follow up on nose bleeds that started yesterday. Started in right nostril and then left started bleeding also, she describes lost of blood and clots, large sized clots. She was dizzy yesterday, but not today.  She presented to the Ed and left before blood work could be done, because she said the bleeding had stopped and they were taking a lot of  time. She had some nasal drainage about 2 weeks ago, but no facial pain, but has headaches- frontal- chronic, no change. She denies, sorethroat, difficulty swallowing or breathing. She is presently taking only Linisopril- 20mg  daily. She takes a daily baby aspirin, no other blood thinners.   Prior to that she had a vomiting, stomach pain and diarrhea, this was over the weekend when she went to visit a friend who already had similar symptoms. These have resolved now. While in clinic she dry heaved once, but this resolved, temperature was rechecked, and was normal, because she was saying she felt a bit chilly. She has had nose bleeds in the past, last 2011/2012, and not nearly as severe as this.  Past Medical History  Diagnosis Date  . Hyperlipidemia   . Hiatal hernia   . GERD (gastroesophageal reflux disease)   . Hypertension   . Anemia   . Arthritis   . Plantar fasciitis    Current Outpatient Prescriptions  Medication Sig Dispense Refill  . atorvastatin (LIPITOR) 40 MG tablet Take 1 tablet (40 mg total) by mouth daily at 6 PM. (Patient not taking: Reported on 12/17/2014) 30 tablet 0  . ferrous sulfate (IRON SUPPLEMENT) 325 (65 FE) MG tablet Take 325 mg by mouth daily with breakfast.     . ibuprofen (ADVIL,MOTRIN) 800 MG tablet Take 1 tablet (800 mg total) by mouth every 8 (eight) hours as  needed for headache or moderate pain. (Patient not taking: Reported on 12/17/2014) 30 tablet 0  . lisinopril (PRINIVIL,ZESTRIL) 20 MG tablet Take 1 tablet (20 mg total) by mouth daily. 30 tablet 1  . lisinopril-hydrochlorothiazide (PRINZIDE,ZESTORETIC) 20-25 MG per tablet Take 1 tablet by mouth daily. 30 tablet 1  . omeprazole (PRILOSEC) 20 MG capsule Take 1 capsule (20 mg total) by mouth daily. 30 capsule 4  . [DISCONTINUED] dicyclomine (BENTYL) 20 MG tablet Take 1 tablet (20 mg total) by mouth 2 (two) times daily. (Patient not taking: Reported on 05/17/2014) 20 tablet 0   No current facility-administered medications for this visit.   Family History  Problem Relation Age of Onset  . Cancer Mother 67    pancreatic cancer  . Pancreatic cancer Mother   . Cancer Father 34    throat cancer  . Cancer Maternal Aunt 60    breast cancer  . Cancer Other     Died  from Colon cancer in 77's  . Colon cancer Maternal Uncle   . Colon cancer Paternal Uncle    Social History   Social History  . Marital Status: Divorced    Spouse Name: N/A  . Number of Children: N/A  . Years of Education: N/A   Occupational History  . unemployed     used to work in a [employer]   Social History Main Topics  .  Smoking status: Current Every Day Smoker -- 0.10 packs/day    Types: Cigarettes  . Smokeless tobacco: Never Used     Comment: Down to 1-2 cig/day.  . Alcohol Use: Yes     Comment: occasionally  . Drug Use: No  . Sexual Activity: Not Asked   Other Topics Concern  . None   Social History Narrative   10 th grade education.   Review of Systems: CONSTITUTIONAL- No Fever SKIN- No Rash, colour changes or itching. RESPIRATORY- No Cough or SOB. CARDIAC- No Palpitations, chest pain. URINARY- No Frequency, or dysuria.  Objective:  Physical Exam: Filed Vitals:   03/11/15 0833  BP: 141/79  Pulse: 68  Temp: 98.2 F (36.8 C)  TempSrc: Oral  Height: 4\' 11"  (1.499 m)  Weight: 139 lb 14.4 oz  (63.458 kg)  SpO2: 100%   GENERAL- alert, co-operative, appears as stated age, not in any distress. HEENT- Atraumatic, normocephalic, PERRL, nose- used orthoscope to examine- No polyps or swelling identified, no obvious lesion or site of bleeding identified, walls appear normal.  CARDIAC- RRR, no murmurs, rubs or gallops. RESP- Moving equal volumes of air, and clear to auscultation bilaterally, no wheezes or crackles. ABDOMEN- Soft, nontender, bowel sounds present. NEURO- Alert and oriented, Gait- Normal. EXTREMITIES- pulse 2+, symmetric, no pedal edema. SKIN- Warm, dry, No rash or lesion. PSYCH- Normal mood and affect, appropriate thought content and speech.  Assessment & Plan:   The patient's case and plan of care was discussed with attending physician, Dr. Ellwood Dense.  Please see problem based charting for assessment and plan.

## 2015-03-11 NOTE — Addendum Note (Signed)
Addended by: Bethena Roys on: 03/11/2015 12:06 PM   Modules accepted: Orders

## 2015-03-12 LAB — CBC
Hematocrit: 35.3 % (ref 34.0–46.6)
Hemoglobin: 10.6 g/dL — ABNORMAL LOW (ref 11.1–15.9)
MCH: 21.6 pg — AB (ref 26.6–33.0)
MCHC: 30 g/dL — ABNORMAL LOW (ref 31.5–35.7)
MCV: 72 fL — AB (ref 79–97)
PLATELETS: 320 10*3/uL (ref 150–379)
RBC: 4.9 x10E6/uL (ref 3.77–5.28)
RDW: 16 % — AB (ref 12.3–15.4)
WBC: 4.5 10*3/uL (ref 3.4–10.8)

## 2015-03-12 LAB — CMP14 + ANION GAP
A/G RATIO: 2 (ref 1.1–2.5)
ALBUMIN: 4.6 g/dL (ref 3.5–5.5)
ALK PHOS: 81 IU/L (ref 39–117)
ALT: 18 IU/L (ref 0–32)
ANION GAP: 22 mmol/L — AB (ref 10.0–18.0)
AST: 20 IU/L (ref 0–40)
BILIRUBIN TOTAL: 0.2 mg/dL (ref 0.0–1.2)
BUN / CREAT RATIO: 14 (ref 9–23)
BUN: 10 mg/dL (ref 6–24)
CHLORIDE: 104 mmol/L (ref 97–108)
CO2: 22 mmol/L (ref 18–29)
Calcium: 9.2 mg/dL (ref 8.7–10.2)
Creatinine, Ser: 0.71 mg/dL (ref 0.57–1.00)
GFR calc Af Amer: 112 mL/min/{1.73_m2} (ref >59)
GFR calc non Af Amer: 98 mL/min/{1.73_m2} (ref >59)
GLOBULIN, TOTAL: 2.3 g/dL (ref 1.5–4.5)
Glucose: 83 mg/dL (ref 65–99)
POTASSIUM: 3.8 mmol/L (ref 3.5–5.2)
SODIUM: 148 mmol/L — AB (ref 134–144)
Total Protein: 6.9 g/dL (ref 6.0–8.5)

## 2015-03-12 LAB — HIV ANTIBODY (ROUTINE TESTING W REFLEX): HIV SCREEN 4TH GENERATION: NONREACTIVE

## 2015-03-25 ENCOUNTER — Encounter: Payer: Self-pay | Admitting: Internal Medicine

## 2015-03-27 ENCOUNTER — Other Ambulatory Visit: Payer: Self-pay | Admitting: Internal Medicine

## 2015-03-29 NOTE — Telephone Encounter (Signed)
Pls sch appt with me, preferably before end of calendar yr, to F/U HTN

## 2015-04-16 ENCOUNTER — Other Ambulatory Visit: Payer: Self-pay | Admitting: Internal Medicine

## 2015-04-16 ENCOUNTER — Telehealth: Payer: Self-pay | Admitting: Internal Medicine

## 2015-04-16 MED ORDER — SULFAMETHOXAZOLE-TRIMETHOPRIM 800-160 MG PO TABS: 1.0000 | ORAL_TABLET | Freq: Two times a day (BID) | ORAL | Status: DC

## 2015-04-16 NOTE — Telephone Encounter (Signed)
See note with order

## 2015-04-16 NOTE — Telephone Encounter (Signed)
Pt called back in, Dr. Lynnae January to call pt back for further information.

## 2015-04-16 NOTE — Progress Notes (Signed)
5 days but 3 days severe sxs of burning in urethra, small blood when wipe, some pain with wiping, freq, increased thirst, pressure, back pain, cold chills this AM. No vaginal D/C, GI sxs, anorexia, fever, travel, GU surgery, new sex partners. Last UTI yrs ago. No recent ABX.  Likely UTI uncomplicated but due to back pain and chills can't R/O pyelo. New onset DM also possible but random glucose 83 in sept.  Bactrim DS for 5 days (mid way btw uncomplicated and pyelo tx). To call if no better Monday or go to ED if worse.

## 2015-04-16 NOTE — Telephone Encounter (Signed)
Pt states she have UTI and want to know if the doctor can call in something for her. Please call pt back.

## 2015-04-22 ENCOUNTER — Encounter: Payer: Self-pay | Admitting: *Deleted

## 2015-04-28 ENCOUNTER — Other Ambulatory Visit: Payer: Self-pay | Admitting: Internal Medicine

## 2015-04-28 NOTE — Telephone Encounter (Signed)
Pt requesting lisinopril to be filled @ [facility] on [address] °

## 2015-04-28 NOTE — Telephone Encounter (Signed)
Pt has refills at [facility], script sent 10/3, they are getting it ready for her to pick up, called pt she states they dont have the plain lisinopril but the tech informed triage nurse that they do have it and they will get it ready. Pt states she does not take the lisinopril mix because it "drops my bloodpressure too much" Please remove the lisinopril/ hctz mix from the med list

## 2015-05-18 ENCOUNTER — Telehealth: Payer: Self-pay | Admitting: Internal Medicine

## 2015-05-18 NOTE — Telephone Encounter (Signed)
Spoke with pt, she is reporting pain in her elbow similar to pain she has had before in her shoulder which "Dr. Lynnae January gave her injections for".   Pain now from elbow to hand, pt reports swelling in elbow, "pt can't even drive". She has tried 800mg  ibuprofen, still with throbbing pain.  Pt cannot come to clinic this week.  Requests appointment for Wed or Thursday next week.  Apt scheduled Wed at 315 with Dr. Charlynn Grimes.

## 2015-05-18 NOTE — Telephone Encounter (Signed)
Pt requesting pain med for left arm pain. Please call pt back.

## 2015-05-18 NOTE — Telephone Encounter (Signed)
Agree with appt. 

## 2015-05-26 ENCOUNTER — Encounter: Payer: Self-pay | Admitting: Internal Medicine

## 2015-05-26 NOTE — Progress Notes (Signed)
Patient ID: [name], female   DOB: 27-Oct-1960, 54 y.o.   MRN: [id number]   Subjective:   Patient ID: [name] female   DOB: Feb 27, 1961 54 y.o.   MRN: [id number]  HPI: [name] is a 54 y.o.     Past Medical History  Diagnosis Date  . Hyperlipidemia   . Hiatal hernia   . GERD (gastroesophageal reflux disease)   . Hypertension   . Anemia   . Arthritis   . Plantar fasciitis    Current Outpatient Prescriptions  Medication Sig Dispense Refill  . atorvastatin (LIPITOR) 40 MG tablet Take 1 tablet (40 mg total) by mouth daily at 6 PM. (Patient not taking: Reported on 12/17/2014) 30 tablet 0  . ferrous sulfate (IRON SUPPLEMENT) 325 (65 FE) MG tablet Take 325 mg by mouth daily with breakfast.     . ibuprofen (ADVIL,MOTRIN) 800 MG tablet Take 1 tablet (800 mg total) by mouth every 8 (eight) hours as needed for headache or moderate pain. (Patient not taking: Reported on 12/17/2014) 30 tablet 0  . lisinopril (PRINIVIL,ZESTRIL) 20 MG tablet TAKE ONE TABLET BY MOUTH ONCE DAILY 30 tablet 5  . lisinopril-hydrochlorothiazide (PRINZIDE,ZESTORETIC) 20-25 MG per tablet Take 1 tablet by mouth daily. 30 tablet 1  . omeprazole (PRILOSEC) 20 MG capsule Take 1 capsule (20 mg total) by mouth daily. 30 capsule 4  . ondansetron (ZOFRAN) 4 MG tablet Take 1 tablet (4 mg total) by mouth every 8 (eight) hours as needed for nausea or vomiting. 10 tablet 0  . sulfamethoxazole-trimethoprim (BACTRIM DS,SEPTRA DS) 800-160 MG tablet Take 1 tablet by mouth 2 (two) times daily. 10 tablet 0  . [DISCONTINUED] dicyclomine (BENTYL) 20 MG tablet Take 1 tablet (20 mg total) by mouth 2 (two) times daily. (Patient not taking: Reported on 05/17/2014) 20 tablet 0   No current facility-administered medications for this visit.   Family History  Problem Relation Age of Onset  . Cancer Mother 78    pancreatic cancer  . Pancreatic cancer Mother   . Cancer Father 4    throat cancer  . Cancer Maternal Aunt  60    breast cancer  . Cancer Other     Died  from Colon cancer in 41's  . Colon cancer Maternal Uncle   . Colon cancer Paternal Uncle    Social History   Social History  . Marital Status: Divorced    Spouse Name: N/A  . Number of Children: N/A  . Years of Education: N/A   Occupational History  . unemployed     used to work in a [employer]   Social History Main Topics  . Smoking status: Current Every Day Smoker -- 0.10 packs/day    Types: Cigarettes  . Smokeless tobacco: Never Used     Comment: Down to 1-2 cig/day.  . Alcohol Use: Yes     Comment: occasionally  . Drug Use: No  . Sexual Activity: Not on file   Other Topics Concern  . Not on file   Social History Narrative   10 th grade education.   Review of Systems:  Objective:  Physical Exam: There were no vitals filed for this visit.  Assessment & Plan:   This encounter was created in error - please disregard.

## 2015-06-03 ENCOUNTER — Ambulatory Visit: Payer: Self-pay | Admitting: Internal Medicine

## 2015-06-17 ENCOUNTER — Encounter: Payer: Self-pay | Admitting: *Deleted

## 2015-07-08 ENCOUNTER — Ambulatory Visit: Payer: Self-pay | Admitting: Internal Medicine

## 2015-07-22 ENCOUNTER — Ambulatory Visit: Payer: Self-pay | Admitting: Internal Medicine

## 2015-07-26 ENCOUNTER — Ambulatory Visit: Payer: Self-pay | Admitting: Internal Medicine

## 2015-11-05 ENCOUNTER — Telehealth: Payer: Self-pay | Admitting: Internal Medicine

## 2015-11-05 ENCOUNTER — Telehealth: Payer: Self-pay

## 2015-11-05 NOTE — Telephone Encounter (Signed)
Tried to call both #'s, pt needs an appt soon

## 2015-11-05 NOTE — Telephone Encounter (Signed)
charsetta please call this pt and schedule asap

## 2015-11-05 NOTE — Telephone Encounter (Signed)
Pt requesting the nurse to call back. 

## 2015-11-10 NOTE — Telephone Encounter (Signed)
Patient called the clinic on May 8 and scheduled a future appt with Dr. Lynnae January on 12-02-15 @ 8:15 am.

## 2015-12-02 ENCOUNTER — Encounter: Payer: Self-pay | Admitting: Internal Medicine

## 2016-01-19 ENCOUNTER — Encounter: Payer: Self-pay | Admitting: Internal Medicine

## 2016-01-20 ENCOUNTER — Encounter: Payer: Self-pay | Admitting: Internal Medicine

## 2016-01-25 ENCOUNTER — Ambulatory Visit: Payer: Self-pay

## 2016-02-16 ENCOUNTER — Telehealth: Payer: Self-pay | Admitting: Internal Medicine

## 2016-02-16 NOTE — Telephone Encounter (Signed)
APT. REMINDER CALL, LMTCB °

## 2016-02-17 ENCOUNTER — Ambulatory Visit (INDEPENDENT_AMBULATORY_CARE_PROVIDER_SITE_OTHER): Payer: Self-pay | Admitting: Internal Medicine

## 2016-02-17 ENCOUNTER — Encounter: Payer: Self-pay | Admitting: Internal Medicine

## 2016-02-17 DIAGNOSIS — Z8673 Personal history of transient ischemic attack (TIA), and cerebral infarction without residual deficits: Secondary | ICD-10-CM

## 2016-02-17 DIAGNOSIS — N958 Other specified menopausal and perimenopausal disorders: Secondary | ICD-10-CM

## 2016-02-17 DIAGNOSIS — I10 Essential (primary) hypertension: Secondary | ICD-10-CM

## 2016-02-17 DIAGNOSIS — F17210 Nicotine dependence, cigarettes, uncomplicated: Secondary | ICD-10-CM

## 2016-02-17 DIAGNOSIS — Z0000 Encounter for general adult medical examination without abnormal findings: Secondary | ICD-10-CM

## 2016-02-17 DIAGNOSIS — M4722 Other spondylosis with radiculopathy, cervical region: Secondary | ICD-10-CM

## 2016-02-17 DIAGNOSIS — M5412 Radiculopathy, cervical region: Secondary | ICD-10-CM

## 2016-02-17 DIAGNOSIS — F17200 Nicotine dependence, unspecified, uncomplicated: Secondary | ICD-10-CM

## 2016-02-17 DIAGNOSIS — Z79899 Other long term (current) drug therapy: Secondary | ICD-10-CM

## 2016-02-17 DIAGNOSIS — D509 Iron deficiency anemia, unspecified: Secondary | ICD-10-CM

## 2016-02-17 DIAGNOSIS — K219 Gastro-esophageal reflux disease without esophagitis: Secondary | ICD-10-CM

## 2016-02-17 DIAGNOSIS — E785 Hyperlipidemia, unspecified: Secondary | ICD-10-CM

## 2016-02-17 DIAGNOSIS — N941 Unspecified dyspareunia: Secondary | ICD-10-CM

## 2016-02-17 DIAGNOSIS — D649 Anemia, unspecified: Secondary | ICD-10-CM

## 2016-02-17 MED ORDER — DICLOFENAC SODIUM 1 % TD GEL: 2.0000 g | Freq: Four times a day (QID) | TRANSDERMAL | 2 refills | Status: DC

## 2016-02-17 MED ORDER — LISINOPRIL-HYDROCHLOROTHIAZIDE 20-25 MG PO TABS: 1.0000 | ORAL_TABLET | Freq: Every day | ORAL | 0 refills | Status: DC

## 2016-02-17 MED ORDER — MELOXICAM 15 MG PO TABS: 15.0000 mg | ORAL_TABLET | Freq: Every day | ORAL | 0 refills | Status: DC

## 2016-02-17 MED ORDER — OMEPRAZOLE 20 MG PO CPDR: 20.0000 mg | DELAYED_RELEASE_CAPSULE | Freq: Every day | ORAL | 3 refills | Status: DC

## 2016-02-17 MED FILL — DICLOFENAC SODIUM 1% GEL: 1 | 15 days supply | Qty: 100 | Fill #0

## 2016-02-17 MED FILL — OMEPRAZOLE DR 20 MG CAPSULE: 20 | 30 days supply | Qty: 30 | Fill #0

## 2016-02-17 NOTE — Progress Notes (Signed)
   Subjective:    Patient ID: [name], female    DOB: 07-14-1960, 55 y.o.   MRN: [id number]  HPI  [name] is here for L arm pain. Please see the A&P for the status of the pt's chronic medical problems.  ROS : per ROS section and in problem oriented charting. All other systems are negative.  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 Likes the residents and the work though. Smokes 1-2 cigs / day.  Review of Systems  Gastrointestinal: Negative for abdominal distention and vomiting.  Genitourinary: Positive for dyspareunia and vaginal bleeding. Negative for menstrual problem and vaginal discharge.  Musculoskeletal: Positive for arthralgias, myalgias and neck pain.  Skin: Negative for color change and rash.  Psychiatric/Behavioral: Positive for sleep disturbance. Negative for dysphoric mood.       Objective:   Physical Exam  Constitutional: She is oriented to person, place, and time. She appears well-developed and well-nourished. No distress.  HENT:  Head: Normocephalic and atraumatic.  Right Ear: External ear normal.  Left Ear: External ear normal.  Nose: Nose normal.  Eyes: Conjunctivae and EOM are normal.  Cardiovascular: Normal rate, regular rhythm and normal heart sounds.   Pulmonary/Chest: Effort normal and breath sounds normal. No respiratory distress. She has no wheezes.  Musculoskeletal: She exhibits no edema or tenderness.  Neurological: She is alert and oriented to person, place, and time.  Skin: Skin is warm and dry. She is not diaphoretic.  Psychiatric: She has a normal mood and affect. Her behavior is normal. Judgment and thought content normal.          Assessment & Plan:

## 2016-02-17 NOTE — Assessment & Plan Note (Signed)
She is taking omeprazole 20 OTC QD but getting too expensive. Med generally controls sxs except when eats certain foods. [facility].  PLAN:  Cont current meds

## 2016-02-17 NOTE — Assessment & Plan Note (Signed)
She remains off statin 2/2 cost. This is for primary prevention as her h/o TIA is rather weak. She has no insurance currently and HTN control is foremost. No FLP today as self pay.  PLAN : cont to follow.

## 2016-02-17 NOTE — Assessment & Plan Note (Signed)
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 She has tried steroid injection, gabapentin, and PT without sig improvement - all through sports med. She was referred to Dr. Lorin Mercy at [facility] who, according to pt, felt surgery was not indicated. 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 She only takes an occasional APAP. No insurance.   PLAN  Mobic 15 QD with food Voltarin gel RTC one month If no better, no great evidence for next steps and limited by financial - try muscle relaxant, sports med for steroid injection, PT, surgery, cymbalta, retry gabapentin.Marland KitchenMarland Kitchen

## 2016-02-17 NOTE — Assessment & Plan Note (Signed)
She cont to take her FeSO4 QD. She is currently self pay so will hold off on labs until gets orange card.  PLAN : labs once get orange card

## 2016-02-17 NOTE — Assessment & Plan Note (Signed)
BP Readings from Last 3 Encounters:  02/17/16 (!) 148/102  03/11/15 (!) 141/79  03/10/15 118/88   BP is not controled today. Originally, she had been on lisinopril 20 and HCTZ 25 and not controlled. I added to that, lisinopril 20 and bc hypotensive at home. She comes in on just lisinopril 20. Pharmacy researched and found that she had picked it up on Aug 14 but had not gotten it previously since 2016. I am changing her back to lisinopril 20 / HCTZ 25 bc I doubt she will get control with just ACE considering her starting BP and race.  PLAN  Lisinopril 20 / HCTZ 25 RTC 1 month for BP, BMP

## 2016-02-17 NOTE — Assessment & Plan Note (Signed)
Dyspareunia - one partner of 14 yrs. Now sex is painful and then bleeds. Is postmenopausal. Is 2/2 post-menopausal vaginal dryness. Encourage OTC lubricants. If no better, to let me know - could try Frost for topical estrogen tx.  No coverage for blood work, vaccines. Encourage orange card - then blood. To find low cost flu vaccine in community.  Refer to [facility] scholarship program.

## 2016-02-17 NOTE — Assessment & Plan Note (Signed)
No longer taking ASA. I am OK with this as never clear if TIA.  PLAN : Follow

## 2016-02-17 NOTE — Assessment & Plan Note (Deleted)
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 She has tried steroid injection, gabapentin, and PT without sig improvement - all through sports med. She was referred to Dr. Lorin Mercy at [facility] who, according to pt, felt surgery was not indicated. 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 She only takes an occasional APAP. No insurance.   PLAN  Mobic 15 QD with food Voltarin gel RTC one month If no better, no great evidence for next steps and limited by financial - try muscle relaxant, sports med for steroid injection, PT, surgery, cymbalta, retry gabapentin.Marland KitchenMarland Kitchen

## 2016-02-17 NOTE — Assessment & Plan Note (Signed)
[place] 1-2 cigs per day. Usually after work bc it is the comfort that she craves - not the nicotine.   PLAN : cont to follow,

## 2016-02-17 NOTE — Patient Instructions (Signed)
At [facility] BP pill lisinopril HCTZ         Pain pill Mobic 15  At [facility] - stomach pill     Gel for pain  See Marlana Latus for orange card  Sch appt in one month - BP check and lab work

## 2016-02-21 ENCOUNTER — Telehealth: Payer: Self-pay | Admitting: Internal Medicine

## 2016-02-21 NOTE — Telephone Encounter (Signed)
APT. REMINDER CALL, LMTCB °

## 2016-02-22 ENCOUNTER — Ambulatory Visit: Payer: Self-pay

## 2016-03-01 ENCOUNTER — Telehealth: Payer: Self-pay

## 2016-03-01 ENCOUNTER — Other Ambulatory Visit: Payer: Self-pay | Admitting: Pharmacist

## 2016-03-01 DIAGNOSIS — I10 Essential (primary) hypertension: Secondary | ICD-10-CM

## 2016-03-01 DIAGNOSIS — M5412 Radiculopathy, cervical region: Secondary | ICD-10-CM

## 2016-03-01 DIAGNOSIS — M549 Dorsalgia, unspecified: Secondary | ICD-10-CM

## 2016-03-01 DIAGNOSIS — K219 Gastro-esophageal reflux disease without esophagitis: Secondary | ICD-10-CM

## 2016-03-01 DIAGNOSIS — D649 Anemia, unspecified: Secondary | ICD-10-CM

## 2016-03-01 MED ORDER — DICLOFENAC SODIUM 1 % TD GEL: 2.0000 g | Freq: Four times a day (QID) | TRANSDERMAL | 2 refills | Status: DC

## 2016-03-01 MED ORDER — LISINOPRIL-HYDROCHLOROTHIAZIDE 20-25 MG PO TABS: 1.0000 | ORAL_TABLET | Freq: Every day | ORAL | 3 refills | Status: DC

## 2016-03-01 MED ORDER — MELOXICAM 15 MG PO TABS: 15.0000 mg | ORAL_TABLET | Freq: Every day | ORAL | 3 refills | Status: DC

## 2016-03-01 MED ORDER — OMEPRAZOLE 20 MG PO CPDR: 20.0000 mg | DELAYED_RELEASE_CAPSULE | Freq: Every day | ORAL | 3 refills | Status: DC

## 2016-03-01 MED ORDER — FERROUS SULFATE 325 (65 FE) MG PO TABS: 325.0000 mg | ORAL_TABLET | Freq: Every day | ORAL | 3 refills | Status: DC

## 2016-03-01 NOTE — Telephone Encounter (Signed)
Requesting the nurse to call back regarding constipation.  

## 2016-03-01 NOTE — Progress Notes (Signed)
Patient called to request pharmacy transfer to [place]. Prescriptions sent.

## 2016-03-01 NOTE — Telephone Encounter (Addendum)
Called pt - c/o constipation; no BM since Sunday which is not normal for her. She has not tried OTC med b/c of HTN. Suggested Miralax, stated did not like.Please advise Thanks

## 2016-03-02 ENCOUNTER — Emergency Department (HOSPITAL_COMMUNITY)
Admission: EM | Admit: 2016-03-02 | Discharge: 2016-03-02 | Discharge status: Against Medical Advice | Payer: Self-pay | Attending: Emergency Medicine | Admitting: Emergency Medicine

## 2016-03-02 ENCOUNTER — Encounter (HOSPITAL_COMMUNITY): Payer: Self-pay

## 2016-03-02 ENCOUNTER — Emergency Department (HOSPITAL_COMMUNITY): Payer: Self-pay

## 2016-03-02 DIAGNOSIS — K5901 Slow transit constipation: Secondary | ICD-10-CM | POA: Insufficient documentation

## 2016-03-02 DIAGNOSIS — F17210 Nicotine dependence, cigarettes, uncomplicated: Secondary | ICD-10-CM | POA: Insufficient documentation

## 2016-03-02 DIAGNOSIS — Z79899 Other long term (current) drug therapy: Secondary | ICD-10-CM | POA: Insufficient documentation

## 2016-03-02 DIAGNOSIS — I10 Essential (primary) hypertension: Secondary | ICD-10-CM | POA: Insufficient documentation

## 2016-03-02 MED ORDER — BISACODYL 10 MG RE SUPP
10.0000 mg | Freq: Once | RECTAL | Status: AC
  Administered 2016-03-02: 10 mg via RECTAL
  Filled 2016-03-02: qty 1

## 2016-03-02 MED ORDER — OXYCODONE-ACETAMINOPHEN 5-325 MG PO TABS: 2.0000 | ORAL_TABLET | Freq: Once | ORAL | Status: DC

## 2016-03-02 MED ORDER — MAGNESIUM CITRATE PO SOLN
1.0000 | Freq: Once | ORAL | Status: AC
  Administered 2016-03-02: 1 via ORAL
  Filled 2016-03-02: qty 296

## 2016-03-02 NOTE — ED Notes (Signed)
Dr. Betsey Holiday informed patient is refusing blood work to be drawn.

## 2016-03-02 NOTE — ED Provider Notes (Addendum)
[facility] DEPT Provider Note   CSN: [id number] Arrival date & time: 03/02/16  0451     History   Chief Complaint Chief Complaint  Patient presents with  . Constipation    HPI [name] is a 55 y.o. female.  Patient presents to the emergency department for evaluation of severe abdominal pain. Patient reports that she has not had a bowel movement in 3 days. She reports that she feels urge to defecate but cannot pass a bowel movement. Patient reports that she does have a history of constipation and has had similar episodes in the past. No fever, nausea or vomiting.      Past Medical History:  Diagnosis Date  . Anemia   . Arthritis   . GERD (gastroesophageal reflux disease)   . Hiatal hernia   . Hyperlipidemia   . Hypertension   . Plantar fasciitis     Patient Active Problem List   Diagnosis Date Noted  . H/O TIA (transient ischemic attack) and stroke 06/12/2014  . Cervical radiculitis 02/12/2014  . Back pain 11/11/2013  . Healthcare maintenance 04/30/2013  . Essential hypertension, benign 01/24/2010  . Anemia 01/11/2010  . Hyperlipemia 05/28/2007  . TOBACCO ABUSE 05/28/2007  . GERD 05/28/2007    Past Surgical History:  Procedure Laterality Date  . ABDOMINAL HYSTERECTOMY  12/09   Secondary to fibroids  . CESAREAN SECTION      OB History    No data available       Home Medications    Prior to Admission medications   Medication Sig Start Date End Date Taking? Authorizing Provider  diclofenac sodium (VOLTAREN) 1 % GEL Apply 2 g topically 4 (four) times daily. 03/01/16   Bartholomew Crews, MD  ferrous sulfate (IRON SUPPLEMENT) 325 (65 FE) MG tablet Take 1 tablet (325 mg total) by mouth daily with breakfast. 03/01/16   Bartholomew Crews, MD  lisinopril-hydrochlorothiazide (PRINZIDE,ZESTORETIC) 20-25 MG tablet Take 1 tablet by mouth daily. IM program 03/01/16   Bartholomew Crews, MD  meloxicam (MOBIC) 15 MG tablet Take 1 tablet (15 mg total) by  mouth daily. IM program 03/01/16   Bartholomew Crews, MD  omeprazole (PRILOSEC) 20 MG capsule Take 1 capsule (20 mg total) by mouth daily. 03/01/16   Bartholomew Crews, MD    Family History Family History  Problem Relation Age of Onset  . Cancer Mother 61    pancreatic cancer  . Pancreatic cancer Mother   . Cancer Father 47    throat cancer  . Cancer Maternal Aunt 60    breast cancer  . Cancer Other     Died  from Colon cancer in 35's  . Colon cancer Maternal Uncle   . Colon cancer Paternal Uncle     Social History Social History  Substance Use Topics  . Smoking status: Current Every Day Smoker    Packs/day: 0.10    Types: Cigarettes  . Smokeless tobacco: Never Used     Comment: Down to 1-2 cig/day.  . Alcohol use Yes     Comment: occasionally     Allergies   Aspirin and Hydromorphone hcl   Review of Systems Review of Systems  Gastrointestinal: Positive for abdominal pain and constipation.  All other systems reviewed and are negative.    Physical Exam Updated Vital Signs BP (!) 159/103 (BP Location: Left Arm)   Pulse 93   Temp 97.8 F (36.6 C) (Oral)   Resp 17   Ht 4\' 11"  (1.499  m)   Wt 133 lb (60.3 kg)   SpO2 94%   BMI 26.86 kg/m   Physical Exam  Constitutional: She is oriented to person, place, and time. She appears well-developed and well-nourished. No distress.  HENT:  Head: Normocephalic and atraumatic.  Right Ear: Hearing normal.  Left Ear: Hearing normal.  Nose: Nose normal.  Mouth/Throat: Oropharynx is clear and moist and mucous membranes are normal.  Eyes: Conjunctivae and EOM are normal. Pupils are equal, round, and reactive to light.  Neck: Normal range of motion. Neck supple.  Cardiovascular: Regular rhythm, S1 normal and S2 normal.  Exam reveals no gallop and no friction rub.   No murmur heard. Pulmonary/Chest: Effort normal and breath sounds normal. No respiratory distress. She exhibits no tenderness.  Abdominal: Soft. Normal  appearance and bowel sounds are normal. She exhibits distension. There is no hepatosplenomegaly. There is generalized tenderness. There is no rebound, no guarding, no tenderness at McBurney's point and negative Murphy's sign. No hernia.  Genitourinary: Rectal exam shows anal tone abnormal.  Genitourinary Comments: rectal exam reveals large amount of soft, Clay stool in rectum, no mass, no gross blood  Musculoskeletal: Normal range of motion.  Neurological: She is alert and oriented to person, place, and time. She has normal strength. No cranial nerve deficit or sensory deficit. Coordination normal. GCS eye subscore is 4. GCS verbal subscore is 5. GCS motor subscore is 6.  Skin: Skin is warm, dry and intact. No rash noted. No cyanosis.  Psychiatric: She has a normal mood and affect. Her speech is normal and behavior is normal. Thought content normal.  Nursing note and vitals reviewed.    ED Treatments / Results  Labs (all labs ordered are listed, but only abnormal results are displayed) Labs Reviewed - No data to display  EKG  EKG Interpretation None       Radiology Dg Abd Acute W/chest  Result Date: 03/02/2016 CLINICAL DATA:  Acute onset of generalized abdominal pain and constipation. Vomiting. Initial encounter. EXAM: DG ABDOMEN ACUTE W/ 1V CHEST COMPARISON:  Chest radiograph performed 07/10/2013, and lumbar spine radiographs performed 03/27/2014 FINDINGS: The lungs are well-aerated and clear. There is no evidence of focal opacification, pleural effusion or pneumothorax. The cardiomediastinal silhouette is within normal limits. The visualized bowel gas pattern is unremarkable. Scattered stool and air are seen within the colon; there is no evidence of small bowel dilatation to suggest obstruction. No free intra-abdominal air is identified on the provided upright view. The rectum is distended with dense stool, measuring 7.7 cm in diameter. No acute osseous abnormalities are seen; the  sacroiliac joints are unremarkable in appearance. IMPRESSION: 1. Unremarkable bowel gas pattern; no free intra-abdominal air seen. Small amount of stool noted in the colon. The rectum is distended with dense stool, measuring 7.7 cm in diameter, raising concern for mild fecal impaction 2. No acute cardiopulmonary process seen. Electronically Signed   By: Garald Balding M.D.   On: 03/02/2016 06:05    Procedures Procedures (including critical care time)  Medications Ordered in ED Medications  bisacodyl (DULCOLAX) suppository 10 mg (10 mg Rectal Given 03/02/16 0553)  magnesium citrate solution 1 Bottle (1 Bottle Oral Given 03/02/16 0553)     Initial Impression / Assessment and Plan / ED Course  I have reviewed the triage vital signs and the nursing notes.  Pertinent labs & imaging results that were available during my care of the patient were reviewed by me and considered in my medical decision making (see  chart for details).  Clinical Course   Patient presents with complaints of constipation. Patient extremely agitated at arrival. She would not sit in the bed for examination. She refused all lab draws. Patient extremely confrontational as soon as I walked into the room. She was apparently upset that she had to wait to be seen, despite being seen within 10 minutes of arrival in the ER. She immediately began complaining about her care and threatening to fill out surveys and giving [place] poor scores.  I did perform a rectal examination in the presence of the nurse. She had a large amount of soft stool in her rectum that could not be manually removed. Patient was informed that she could have a soapsuds enema attempted, but became more agitated because this did not happen immediately and left the emergency department [place]. Both she and her significant other were taking staff names and threatening to report all the staff in the emergency department as they left.  Addendum: Patient and  significant other oriented to the emergency department complaining. I discussed further treatment with the significant other and patient was placed back into a room. Arrangements were being made to provide her with analgesia and an enema when she had a large bowel movement, felt better and left the emergency department again.  Final Clinical Impressions(s) / ED Diagnoses   Final diagnoses:  Slow transit constipation    New Prescriptions New Prescriptions   No medications on file     Orpah Greek, MD 03/02/16 [place], MD 03/02/16 [phone]

## 2016-03-02 NOTE — ED Notes (Signed)
Phlebotomy states patient refused blood draw at 0523. This RN was witness for manual digital rectal exam by Dr. Betsey Holiday at [facility]. Pt states she is very upset "at the amount of time it has taken" for the doctor to see her. Dr. Betsey Holiday had already been in to see patient already at 0500 when patient had been here for less than 15 minutes. This RN and Dr. Betsey Holiday apologized to patient that she felt as though she was waiting a long time.

## 2016-03-02 NOTE — ED Notes (Signed)
Pt reports she had a large bowel movement in the toilet and will take another laxative when she gets home. Seen walking out of ED with steady gait. Dr. Betsey Holiday made aware.

## 2016-03-02 NOTE — Telephone Encounter (Signed)
I saw she went to ED and got enema and had BM.

## 2016-03-02 NOTE — ED Triage Notes (Signed)
Pt states she is constipated, LBM was about 3 days ago although she had a small one yesterday but "its not moving like it should." She reports abdominal pain that radiates to legs. She states she feels like she has to have a BM but she cant. Pt standing in room and appears to be in pain and will not sit still.

## 2016-03-02 NOTE — ED Notes (Signed)
Pt approached nurses station very upset because she is in pain and stated she was leaving "because yall aint did nothing for me." Pt is raising her voice at the nurses station and calling this RN names including "Heffa" and a "Bitch" using vulgar language and threatening this RN's job, "whats your name, you're going to pay for what you did to me!" Pt demanded to receive her "discharge paperwork" but this RN told her that she had not been discharged and that if she left she would be leaving AMA. Pt observed ambulating out of ED walked outside of ED and Pts female visitor returned back to nurses station and walked to the doctors work area asking for the patients pain to be taken care of. Security called at this time. Dr. Betsey Holiday assured patients visitor that she we can still treat her pain and she was welcome to return to the room to continue to be treated by the doctor. At this time, Lionel December, the director of the ED, arrived to de-escalate the patient's visitor. This RN observed the two of them walking out of POD A into the waiting room. Shortly after, Corene Cornea returned with the patient and was taken to another room in POD A and pt heard stating 'anyone can come into my room except that nurse right there" and indicated this RN. Pt observed at this time to be talking with Lionel December, the department director.

## 2016-03-02 NOTE — ED Notes (Signed)
Pt walked to bathroom by this RN to attempt bowel movement.

## 2016-03-02 NOTE — ED Notes (Addendum)
The patient verbalized concerns regarding the administration of the suppository by this RN. This RN administered patient's suppository into her rectum while patient was leaning over onto the stretcher. Rectum visualized by this RN directly and suppository inserted into patients rectum only. The patient had accused this RN of placing my fingers "in my vagina" during the suppository administration. This RN assured patient that no such thing was done. Pt became more upset than she already was at this point and demanded to speak with the charge RN. This RN told patient I would be more than willing to have her speak with the charge nurse. This RN proceeded to locate [facility], Agricultural consultant, but she was in a critical patients room at that time and this RN went back to update patient. I stated, "I am more than willing to have the charge RN speak with you regarding your concerns but she is in with a critical patient at this time and when she is finished she would come speak with you, but in the mean time, continue to drink your mag citrate" and pt interrupted this RN and stated that I was no longer allowed her treatment room. This RN made charge RN aware of situation and assured charge RN that proper procedure was used for the correct administration of the suppository. This RN no longer went back into patients room after this incident.   Shortly after incident, patient walked to nurses station and told the doctor that the "medicine yall gave me isnt working!" Dr. Betsey Holiday informed patient that it would be a few minutes before the soap suds enema would be ready for the patient. Pt walked back to room at that time.

## 2016-03-02 NOTE — ED Notes (Signed)
Pt back from x-ray.

## 2016-03-02 NOTE — ED Notes (Signed)
Pt initially refusing xray. This RN explained to patient the reason for the xray and patient then agreeable to go. Pt being transported to xray at this time.

## 2016-03-02 NOTE — ED Notes (Signed)
After using restroom pt,left going out the door towards lobby

## 2016-03-17 ENCOUNTER — Ambulatory Visit (INDEPENDENT_AMBULATORY_CARE_PROVIDER_SITE_OTHER): Payer: Self-pay | Admitting: Internal Medicine

## 2016-03-17 DIAGNOSIS — F17210 Nicotine dependence, cigarettes, uncomplicated: Secondary | ICD-10-CM

## 2016-03-17 DIAGNOSIS — B9789 Other viral agents as the cause of diseases classified elsewhere: Principal | ICD-10-CM

## 2016-03-17 DIAGNOSIS — J069 Acute upper respiratory infection, unspecified: Secondary | ICD-10-CM

## 2016-03-17 NOTE — Progress Notes (Signed)
   CC: Congestion, cough, malaise  HPI:  [name] is a 55 y.o. female with PMHx detailed below presenting with 2 days of progressive malaise, subjective fevers/chills, congestion, postnasal drip, and dry cough.  See problem based assessment and plan below for additional details.  Past Medical History:  Diagnosis Date  . Anemia   . Arthritis   . GERD (gastroesophageal reflux disease)   . Hiatal hernia   . Hyperlipidemia   . Hypertension   . Plantar fasciitis     Review of Systems: Review of Systems  Constitutional: Positive for chills, fever and malaise/fatigue.  HENT: Positive for congestion.   Respiratory: Positive for cough. Negative for sputum production and shortness of breath.   Cardiovascular: Negative for chest pain.  Gastrointestinal: Negative for abdominal pain and diarrhea.  Musculoskeletal: Negative for myalgias.  Neurological: Negative for dizziness.    Physical Exam: Vitals:   03/17/16 0914  BP: (!) 173/102  Pulse: (!) 102  Temp: 98.8 F (37.1 C)  TempSrc: Oral  SpO2: 100%  Weight: 134 lb 12.8 oz (61.1 kg)  Height: 4\' 11"  (1.499 m)   Body mass index is 27.23 kg/m. GENERAL- Well-dressed woman sitting in exam room chair, alert, in no distress HEENT- Atraumatic, moist mucous membranes, oropharynx mildly erythematous, left maxillary sinus tender to palpation, tender and mildly enlarged jugulodigastric nodes bilaterally CARDIAC- Mild tachycardic rate and regular rhythm, no murmurs, rubs or gallops. RESP- Clear to ascultation bilaterally, no wheezing or crackles, normal work of breathing ABDOMEN- Normoactive bowel sounds, soft, nontender, nondistended EXTREMITIES- Normal bulk and range of motion, no edema, 2+ peripheral pulses SKIN- Warm, dry, intact, without visible rash PSYCH- Appropriate affect, clear speech, thoughts linear and goal-directed  Assessment & Plan:   See encounters tab for problem based medical decision making.  Patient seen  with Dr. Angelia Mould

## 2016-03-17 NOTE — Progress Notes (Signed)
Internal Medicine Clinic Attending  I saw and evaluated the patient.  I personally confirmed the key portions of the history and exam documented by Dr. Christmas and I reviewed pertinent patient test results.  The assessment, diagnosis, and plan were formulated together and I agree with the documentation in the resident's note.  

## 2016-03-17 NOTE — Assessment & Plan Note (Signed)
Presenting with 2 days of progressive symptoms - initially subjective fever/chills/malaise, then congestion, postnasal drip and dry cough. The phlegm draining in her throat had made her feel nauseous to the point vomiting a few times. 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 She denies myalgias and has not had temperatures above 99. She is staying well-hydrated but has little appetite for food. She is a smoker but has stopped these past few days due to smoke worsening her cough. On exam she has some mild ervical lymphadenopathy and left maxillary sinus tenderness to palpation.   Assessment: viral URI  Plan: - Supportive care, encouraged hydration, soft foods, sleep - OTCs for symptom relief - Advised to continue to avoid smoking and work toward cessation - Provided work note until next week

## 2016-03-17 NOTE — Patient Instructions (Signed)
Please continue to remain well hydrated and get plenty of rest. Try eating soft foods to maintain your energy.  You can use over the counter medications like cough syrup, Dayquil, mucinex, cough drops, and Tylenol for symptom relief  Please continue to refrain from smoking.  If you develop high fevers, become short of breath, or begin producing large amounts of green phlegm from your sinuses or cough - please return to clinic.  Thank you for visiting [place] today, we hope you feel better!

## 2016-03-20 MED FILL — MELOXICAM 15 MG TABLET: 15 | 30 days supply | Qty: 30 | Fill #0

## 2016-03-20 MED FILL — LISINOPRIL-HCTZ 20-25 MG TA: 20-25 | 30 days supply | Qty: 30 | Fill #0

## 2016-03-20 MED FILL — DICLOFENAC SODIUM 1% GEL: 1 | 13 days supply | Qty: 100 | Fill #0

## 2016-03-30 ENCOUNTER — Ambulatory Visit: Payer: Self-pay

## 2016-04-04 MED FILL — OMEPRAZOLE DR 20 MG CAPSULE: 20 | 30 days supply | Qty: 30 | Fill #1

## 2016-04-06 ENCOUNTER — Encounter: Payer: Self-pay | Admitting: Pulmonary Disease

## 2016-04-13 ENCOUNTER — Ambulatory Visit: Payer: Self-pay | Admitting: Internal Medicine

## 2016-05-25 ENCOUNTER — Ambulatory Visit: Payer: Self-pay | Admitting: Internal Medicine

## 2016-06-05 ENCOUNTER — Telehealth: Payer: Self-pay

## 2016-06-05 DIAGNOSIS — I10 Essential (primary) hypertension: Secondary | ICD-10-CM

## 2016-06-05 MED ORDER — LISINOPRIL 20 MG PO TABS: 20.0000 mg | ORAL_TABLET | Freq: Every day | ORAL | 3 refills | Status: DC

## 2016-06-05 MED ORDER — AMLODIPINE BESYLATE 2.5 MG PO TABS: 2.5000 mg | ORAL_TABLET | Freq: Every day | ORAL | 0 refills | Status: DC

## 2016-06-05 MED FILL — LISINOPRIL 20 MG TABLET: 20 | 30 days supply | Qty: 30 | Fill #0

## 2016-06-05 MED FILL — AMLODIPINE BESYLATE 2.5 MG: 2.5 | 30 days supply | Qty: 30 | Fill #0

## 2016-06-05 NOTE — Telephone Encounter (Signed)
Please call pt back regarding bp meds.

## 2016-06-05 NOTE — Telephone Encounter (Signed)
Called pt - no answer; left message Dr Lynnae January has "called in norvasc 2.5 - the lowest dose bc I doubt her BP will be OK with just lisinopril 20. I will see her 1/11 appt". And call for any questions.

## 2016-06-05 NOTE — Assessment & Plan Note (Signed)
Pt stated HCTZ caused HA. Will change back to lisinopril 20 and add norvasc 2.5. Appt 1/11 with me

## 2016-06-05 NOTE — Telephone Encounter (Signed)
Returned pt's call - stated she cannot take Lisinopril-HCTZ 20-25 mg; causing her head to hurt, drowsiness, "I just can't function". Wants to know if u would put her back on Lisinopril 20 mg until her appt on Jan 11. Thanks

## 2016-07-05 ENCOUNTER — Telehealth: Payer: Self-pay | Admitting: Internal Medicine

## 2016-07-05 NOTE — Telephone Encounter (Signed)
APT. REMINDER CALL, LMTCB °

## 2016-07-06 ENCOUNTER — Ambulatory Visit (INDEPENDENT_AMBULATORY_CARE_PROVIDER_SITE_OTHER): Payer: Self-pay | Admitting: Internal Medicine

## 2016-07-06 ENCOUNTER — Encounter: Payer: Self-pay | Admitting: Internal Medicine

## 2016-07-06 DIAGNOSIS — K219 Gastro-esophageal reflux disease without esophagitis: Secondary | ICD-10-CM

## 2016-07-06 DIAGNOSIS — F17210 Nicotine dependence, cigarettes, uncomplicated: Secondary | ICD-10-CM

## 2016-07-06 DIAGNOSIS — M5412 Radiculopathy, cervical region: Secondary | ICD-10-CM

## 2016-07-06 DIAGNOSIS — Z8739 Personal history of other diseases of the musculoskeletal system and connective tissue: Secondary | ICD-10-CM

## 2016-07-06 DIAGNOSIS — I10 Essential (primary) hypertension: Secondary | ICD-10-CM

## 2016-07-06 DIAGNOSIS — D500 Iron deficiency anemia secondary to blood loss (chronic): Secondary | ICD-10-CM

## 2016-07-06 DIAGNOSIS — E7849 Other hyperlipidemia: Secondary | ICD-10-CM

## 2016-07-06 DIAGNOSIS — Z0000 Encounter for general adult medical examination without abnormal findings: Secondary | ICD-10-CM

## 2016-07-06 DIAGNOSIS — D649 Anemia, unspecified: Secondary | ICD-10-CM

## 2016-07-06 DIAGNOSIS — Z79899 Other long term (current) drug therapy: Secondary | ICD-10-CM

## 2016-07-06 DIAGNOSIS — Z90710 Acquired absence of both cervix and uterus: Secondary | ICD-10-CM

## 2016-07-06 DIAGNOSIS — M5410 Radiculopathy, site unspecified: Secondary | ICD-10-CM

## 2016-07-06 MED ORDER — HYDROCODONE-ACETAMINOPHEN 5-325 MG PO TABS: 1.0000 | ORAL_TABLET | Freq: Three times a day (TID) | ORAL | 0 refills | Status: DC | PRN

## 2016-07-06 MED FILL — HYDROCODON-APAP 5-325: 5-325 | 5 days supply | Qty: 15 | Fill #0

## 2016-07-06 NOTE — Progress Notes (Signed)
   Subjective:    Patient ID: [name], female    DOB: 1961/05/06, 56 y.o.   MRN: [id number]  HPI  [name] is here for back and hip pain. Please see the A&P for the status of the pt's chronic medical problems.  ROS : per ROS section and in problem oriented charting. All other systems are negative.  PMHx, Soc hx, and / or Fam hx : Dm throughout her family in many members. Is married. Now working in housekeeping at facility. 7 hr days, enjoys the residents and co-workers.   Review of Systems  Constitutional: Negative for activity change and unexpected weight change.  Gastrointestinal: Negative for abdominal pain.       One episode of watery fecal incontinence 2 weeks ago. Fecal urgency since  Genitourinary:       +dysparunia  Urge  incontinence  Musculoskeletal: Positive for arthralgias, back pain, gait problem and myalgias. Negative for neck pain and neck stiffness.  Neurological: Negative for dizziness and light-headedness.  Psychiatric/Behavioral: Positive for sleep disturbance.       Objective:   Physical Exam  Constitutional: She is oriented to person, place, and time. She appears well-developed and well-nourished. No distress.  HENT:  Head: Normocephalic and atraumatic.  Right Ear: External ear normal.  Left Ear: External ear normal.  Nose: Nose normal.  Eyes: Conjunctivae and EOM are normal.  Musculoskeletal: Normal range of motion. She exhibits no edema or deformity.  Strength LE B equal and 5/5 Int rotation R hip caused lateral hip pain. L int rotation OK. Able to flex and extend trunk without pain. Sitting SLR caused pain in lateral hip and anterior thigh on I. L SLR OK  Neurological: She is alert and oriented to person, place, and time.  Skin: Skin is warm and dry. She is not diaphoretic. No erythema.  Psychiatric: She has a normal mood and affect. Her behavior is normal. Judgment and thought content normal.          Assessment & Plan:

## 2016-07-06 NOTE — Patient Instructions (Addendum)
1. See me or a colleague of mine in 2 weeks for back pain 2. Take the pain medicine at night to help you sleep 3. Let me know when your insurance is complete so I can order an MRI 4. If you cannot control your bowels and / or bladder or you have leg / back weakness - that is an emergency and come immediately to hospital

## 2016-07-07 DIAGNOSIS — M5410 Radiculopathy, site unspecified: Secondary | ICD-10-CM | POA: Insufficient documentation

## 2016-07-07 NOTE — Assessment & Plan Note (Signed)
This problem is episodic and improved. She currently has no neck or arm pain on the right. The pain resolved without intervention.  Plan : Follow

## 2016-07-07 NOTE — Assessment & Plan Note (Signed)
This problem is chronic and stable. She takes no medication because of financial restraints. This would be for primary prevention.  Plan : Continue to follow. Consider lipid panel once she is able to afford.

## 2016-07-07 NOTE — Assessment & Plan Note (Signed)
This is a new problem. This started after Thanksgiving but before her new job. It has worsened over this time. It started and her right hip that has since moved into the lower right leg and also into the left hip. Her right leg is by far worse. She has pain in the lateral right but that can also run into the foot when she bends. She is also noticed pain on the right foot and the toes and also the top of her foot. 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 She is unable to get into a comfortable position at night. She lays on her left hip the right hip hurts and when she lays on the right hip the left hip her. She also has pain at night laying on her back or stomach. 2 weeks ago she had one episode of watery fecal incontinence while driving. She did not feel the need to have a bowel movement at that time. Since then she has had no further fecal incontinence but does feel fecal urgency. She has no urinary incontinence although she does have urgency. She has tried Tylenol, meloxicam, Voltaren gel, Aleve, in 2 weeks of gabapentin without any relief.  Assessment : Her symptoms are not consistent trochanteric bursitis. It would also be atypical to have hip osteoarthritis with all the pain being in the lateral hip rather than the groin. Claudication is unlikely although she does have some risk factors but her other symptoms are not consistent. Her symptoms are consistent with a radicular pain and this is what we will work up. I do not feel her one episode of fecal incontinence represents cauda equina because it only occurred once, she has control of her bowels and bladder currently, her strength is normal. Her workup is going to be delayed by lack of insurance although we did discuss that this may resolve spontaneously just like her cervical radiculopathy.  Plan : Hydrocodone 5 mg before bedtime. Return in 2 weeks for assessment I gave her verbal and written  instructions to return for red flags She will call me when she gets insurance so that we may obtain an MRI if she still has symptoms

## 2016-07-07 NOTE — Assessment & Plan Note (Signed)
This problem is chronic and improved. She takes a PPI which [facility] She currently has no symptoms of GERD and is tolerating her PPI well.  PLAN:  Cont current meds

## 2016-07-07 NOTE — Assessment & Plan Note (Signed)
This problem is chronic and improved. She takes lisinopril 20 mg and hydrochlorothiazide 25 mg and amlodipine 2.5 mg. She has no side effects and is tolerating this regimen well. She is not getting dizzy or lightheaded. Her blood pressure is improved but might want to consider increasing amlodipine to 5 mg if she remains above [id number] systolic and 80 diastolic at her next appointment.  PLAN:  Cont current meds   BP Readings from Last 3 Encounters:  07/06/16 133/84  03/17/16 (!) 173/102  03/02/16 (!) 159/103

## 2016-07-07 NOTE — Assessment & Plan Note (Signed)
This problem is chronic stable. Her baseline hemoglobin is around 10. She has not had a recent workup. Prior studies were consistent with iron deficiency in 2008. Her iron deficiency with secondary to menorrhagia and she has since had a hysterectomy. She has not had colon cancer screening secondary to lack of insurance.  Plan: Once she is able to afford workup which should be seen as she is obtaining insurance currently I will obtain a CBC, ferritin, and colon cancer screening.

## 2016-07-07 NOTE — Assessment & Plan Note (Signed)
We did not obtain any screenings today because she currently does not have the means to pay for it.

## 2016-07-10 MED FILL — OMEPRAZOLE DR 20 MG CAPSULE: 20 | 30 days supply | Qty: 30 | Fill #2

## 2016-07-10 MED FILL — AMLODIPINE BESYLATE 2.5 MG: 2.5 | 30 days supply | Qty: 30 | Fill #1

## 2016-07-10 MED FILL — LISINOPRIL 20 MG TABLET: 20 | 30 days supply | Qty: 30 | Fill #1

## 2016-07-21 ENCOUNTER — Encounter: Payer: Self-pay | Admitting: Internal Medicine

## 2016-07-21 ENCOUNTER — Ambulatory Visit: Payer: Self-pay

## 2016-07-21 ENCOUNTER — Ambulatory Visit (INDEPENDENT_AMBULATORY_CARE_PROVIDER_SITE_OTHER): Payer: BLUE CROSS/BLUE SHIELD | Admitting: Internal Medicine

## 2016-07-21 VITALS — BP 134/85 | HR 82 | Temp 97.9°F | Ht 59.0 in | Wt 138.3 lb

## 2016-07-21 DIAGNOSIS — Z79891 Long term (current) use of opiate analgesic: Secondary | ICD-10-CM

## 2016-07-21 DIAGNOSIS — F17210 Nicotine dependence, cigarettes, uncomplicated: Secondary | ICD-10-CM

## 2016-07-21 DIAGNOSIS — I10 Essential (primary) hypertension: Secondary | ICD-10-CM

## 2016-07-21 DIAGNOSIS — Z79899 Other long term (current) drug therapy: Secondary | ICD-10-CM

## 2016-07-21 DIAGNOSIS — M5410 Radiculopathy, site unspecified: Secondary | ICD-10-CM

## 2016-07-21 DIAGNOSIS — M5416 Radiculopathy, lumbar region: Secondary | ICD-10-CM | POA: Diagnosis not present

## 2016-07-21 MED ORDER — DIAZEPAM 5 MG PO TABS: ORAL_TABLET | ORAL | 0 refills | Status: DC

## 2016-07-21 NOTE — Patient Instructions (Signed)
1. I will call you with resuts

## 2016-07-21 NOTE — Assessment & Plan Note (Signed)
This problem is chronic and stable. Her blood pressure remains acceptable on amlodipine 2.5 mg, lisinopril 20 mg, and HCTZ 25. She has had no side effects to these medications other than some urinary urgency.  PLAN:  Cont current meds   BP Readings from Last 3 Encounters:  07/21/16 134/85  07/06/16 133/84  03/17/16 (!) 173/102

## 2016-07-21 NOTE — Assessment & Plan Note (Signed)
This problem is established and unchanged. She is here for follow-up from her appointment 2 weeks ago. Her back pain and leg pain is unchanged except it is now more bothersome to her at work especially when she is moving around. It continues to be worse in the right leg rather than the left leg. It continues to radiate down the right leg to the toes. It starts in the lower back and wraps around to the right lateral thigh and then radiates down the front of her leg to the toes. She has had no fecal or urinary incontinence. She has had no weakness or falls. She is using hydrocodone 5 mg one half tablet before bed with good results. She is to start a second job that is a 12 hour shift that worries that she will not be able to do this. She has changed her shoes and is now using inserts without relief.  Assessment : Right L4-L5 radiculopathy  Plan : MRI lumbar spine Pretreat with Valium 5 mg Continue hydrocodone Hold off on new job for now

## 2016-07-21 NOTE — Progress Notes (Signed)
   Subjective:    Patient ID: [name], female    DOB: 01-03-1961, 56 y.o.   MRN: [id number]  HPI  NICOLA BEYDA is here for B lumbar radiculopathy F/U. Please see the A&P for the status of the pt's chronic medical problems.  ROS : per ROS section and in problem oriented charting. All other systems are negative.  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   Review of Systems  Gastrointestinal:       + fecal urgency but no incontinence   Genitourinary:       No urinary incontinence  Musculoskeletal: Positive for arthralgias, back pain and gait problem.       B lateral thigh swelling  Psychiatric/Behavioral: Negative for sleep disturbance.       Objective:   Physical Exam  Constitutional: She appears well-developed and well-nourished. No distress.  HENT:  Head: Normocephalic and atraumatic.  Right Ear: External ear normal.  Left Ear: External ear normal.  Nose: Nose normal.  Neurological: She is alert.  Abnl gait, favoring R leg.   Skin: Skin is warm and dry. No rash noted. She is not diaphoretic. No erythema.  Psychiatric: She has a normal mood and affect. Her behavior is normal. Judgment and thought content normal.          Assessment & Plan:

## 2016-07-31 ENCOUNTER — Telehealth: Payer: Self-pay | Admitting: Internal Medicine

## 2016-07-31 DIAGNOSIS — M5410 Radiculopathy, site unspecified: Secondary | ICD-10-CM

## 2016-07-31 NOTE — Telephone Encounter (Signed)
[facility]. Only allowed to leave voice mail left details. Requesting first Jan note to be faxed in also

## 2016-07-31 NOTE — Assessment & Plan Note (Signed)
Insurance denied MRI. Called pt - R leg cont to worsen. Still radicular. Therapies tried and not working inc Tylenol, meloxicam, Voltaren gel, Aleve, and gabapentin without any relief. Taking hydrocodone QHS withh minimal relief.

## 2016-08-11 ENCOUNTER — Ambulatory Visit (HOSPITAL_COMMUNITY): Payer: BLUE CROSS/BLUE SHIELD

## 2016-08-17 ENCOUNTER — Encounter (HOSPITAL_COMMUNITY): Payer: Self-pay

## 2016-08-17 ENCOUNTER — Ambulatory Visit (HOSPITAL_COMMUNITY)
Admission: RE | Admit: 2016-08-17 | Discharge: 2016-08-17 | Disposition: A | Discharge status: Home / Self Care | Payer: BLUE CROSS/BLUE SHIELD | Source: Ambulatory Visit | Attending: Internal Medicine | Admitting: Internal Medicine

## 2016-08-17 DIAGNOSIS — M5416 Radiculopathy, lumbar region: Secondary | ICD-10-CM

## 2016-08-18 MED FILL — LISINOPRIL 20 MG TABLET: 20 | 30 days supply | Qty: 30 | Fill #2

## 2016-08-18 MED FILL — OMEPRAZOLE DR 20 MG CAPSULE: 20 | 30 days supply | Qty: 30 | Fill #3

## 2016-08-18 MED FILL — AMLODIPINE BESYLATE 2.5 MG: 2.5 | 30 days supply | Qty: 30 | Fill #2
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
## 2016-08-30 ENCOUNTER — Emergency Department (HOSPITAL_COMMUNITY)
Admission: EM | Admit: 2016-08-30 | Discharge: 2016-08-30 | Disposition: A | Discharge status: Home / Self Care | Payer: BLUE CROSS/BLUE SHIELD | Attending: Emergency Medicine | Admitting: Emergency Medicine

## 2016-08-30 ENCOUNTER — Encounter (HOSPITAL_COMMUNITY): Payer: Self-pay | Admitting: Emergency Medicine

## 2016-08-30 DIAGNOSIS — J0100 Acute maxillary sinusitis, unspecified: Secondary | ICD-10-CM | POA: Diagnosis not present

## 2016-08-30 DIAGNOSIS — J069 Acute upper respiratory infection, unspecified: Secondary | ICD-10-CM | POA: Diagnosis not present

## 2016-08-30 DIAGNOSIS — Z79899 Other long term (current) drug therapy: Secondary | ICD-10-CM | POA: Diagnosis not present

## 2016-08-30 DIAGNOSIS — I10 Essential (primary) hypertension: Secondary | ICD-10-CM | POA: Insufficient documentation

## 2016-08-30 DIAGNOSIS — Z8673 Personal history of transient ischemic attack (TIA), and cerebral infarction without residual deficits: Secondary | ICD-10-CM | POA: Diagnosis not present

## 2016-08-30 DIAGNOSIS — F17210 Nicotine dependence, cigarettes, uncomplicated: Secondary | ICD-10-CM | POA: Insufficient documentation

## 2016-08-30 DIAGNOSIS — J0110 Acute frontal sinusitis, unspecified: Secondary | ICD-10-CM | POA: Diagnosis not present

## 2016-08-30 DIAGNOSIS — R0981 Nasal congestion: Secondary | ICD-10-CM | POA: Diagnosis present

## 2016-08-30 NOTE — ED Triage Notes (Signed)
Pt reports nasal drainage and congestion times once week. Pt also c/o sore throat and sinus pressure.

## 2016-08-30 NOTE — ED Provider Notes (Signed)
[facility] DEPT Provider Note   CSN: [id number] Arrival date & time: 08/30/16  6237     History   Chief Complaint Chief Complaint  Patient presents with  . Nasal Congestion  . Sore Throat    HPI [name] is a 56 y.o. female who presents with a frontal headache, sinus pressure, post-nasal drip, sore throat, and a dry cough. She states that her symptoms have been going on for about a week. She has a remote history of asthma as a child but does not need inhalers. She has been taking Alka Seltzer and robitussin with mild relief. She wanted to see her PCP but she was at work today and they told her she had to be seen today therefore she came to the ED. She denies fever, chills, severe headache, worst headache of life, neck pain, ear pain, rhinorrhea, chest pain, SOB, wheezing. Denies allergies.  HPI  Past Medical History:  Diagnosis Date  . Anemia   . Arthritis   . GERD (gastroesophageal reflux disease)   . Hiatal hernia   . Hyperlipidemia   . Hypertension   . Plantar fasciitis     Patient Active Problem List   Diagnosis Date Noted  . Radicular pain of right lower extremity 07/07/2016  . H/O TIA (transient ischemic attack) and stroke 06/12/2014  . Cervical radiculitis 02/12/2014  . Healthcare maintenance 04/30/2013  . Essential hypertension, benign 01/24/2010  . Anemia 01/11/2010  . Hyperlipemia 05/28/2007  . TOBACCO ABUSE 05/28/2007  . GERD 05/28/2007    Past Surgical History:  Procedure Laterality Date  . ABDOMINAL HYSTERECTOMY  12/09   Secondary to fibroids  . CESAREAN SECTION      OB History    No data available       Home Medications    Prior to Admission medications   Medication Sig Start Date End Date Taking? Authorizing Provider  amLODipine (NORVASC) 2.5 MG tablet Take 1 tablet (2.5 mg total) by mouth daily. [facility] IM program 06/05/16 06/05/17  Bartholomew Crews, MD  diazepam (VALIUM) 5 MG tablet Take one pill one hour prior to MRI. Take  second pill if there is a delay. Someone needs to drive you to MRI. 12/21/29   Bartholomew Crews, MD  diclofenac sodium (VOLTAREN) 1 % GEL Apply 2 g topically 4 (four) times daily. 03/01/16   Bartholomew Crews, MD  ferrous sulfate (IRON SUPPLEMENT) 325 (65 FE) MG tablet Take 1 tablet (325 mg total) by mouth daily with breakfast. 03/01/16   Bartholomew Crews, MD  HYDROcodone-acetaminophen (NORCO/VICODIN) 5-325 MG tablet Take 1 tablet by mouth 3 (three) times daily as needed for moderate pain. 07/06/16   Bartholomew Crews, MD  lisinopril (PRINIVIL,ZESTRIL) 20 MG tablet Take 1 tablet (20 mg total) by mouth daily. 06/05/16   Bartholomew Crews, MD  meloxicam (MOBIC) 15 MG tablet Take 1 tablet (15 mg total) by mouth daily. IM program 03/01/16   Bartholomew Crews, MD  omeprazole (PRILOSEC) 20 MG capsule Take 1 capsule (20 mg total) by mouth daily. 03/01/16   Bartholomew Crews, MD    Family History Family History  Problem Relation Age of Onset  . Cancer Mother 82    pancreatic cancer  . Pancreatic cancer Mother   . Cancer Father 73    throat cancer  . Cancer Maternal Aunt 60    breast cancer  . Cancer Other     Died  from Colon cancer in 8's  . Colon cancer  Maternal Uncle   . Colon cancer Paternal Uncle     Social History Social History  Substance Use Topics  . Smoking status: Current Every Day Smoker    Packs/day: 0.10    Types: Cigarettes  . Smokeless tobacco: Never Used     Comment: Down to 1-2 cig/day.  . Alcohol use Yes     Comment: occasionally     Allergies   Aspirin and Hydromorphone hcl   Review of Systems Review of Systems  Constitutional: Negative for chills and fever.  HENT: Positive for congestion, postnasal drip, sinus pressure and sore throat. Negative for ear pain, rhinorrhea and trouble swallowing.   Respiratory: Positive for cough. Negative for shortness of breath and wheezing.   Cardiovascular: Negative for chest pain.  Gastrointestinal: Negative for  abdominal pain, nausea and vomiting.  Allergic/Immunologic: Negative for environmental allergies.     Physical Exam Updated Vital Signs BP 154/94 (BP Location: Right Arm)   Pulse 91   Temp 98.8 F (37.1 C) (Oral)   Resp 16   Ht 4\' 11"  (1.499 m)   Wt 62.6 kg   SpO2 100%   BMI 27.87 kg/m   Physical Exam  Constitutional: She is oriented to person, place, and time. She appears well-developed and well-nourished. No distress.  HENT:  Head: Normocephalic and atraumatic.  Right Ear: Hearing, tympanic membrane, external ear and ear canal normal.  Left Ear: Hearing, tympanic membrane, external ear and ear canal normal.  Nose: Right sinus exhibits maxillary sinus tenderness and frontal sinus tenderness. Left sinus exhibits maxillary sinus tenderness and frontal sinus tenderness.  Mouth/Throat: Uvula is midline, oropharynx is clear and moist and mucous membranes are normal.  Eyes: Conjunctivae are normal. Pupils are equal, round, and reactive to light. Right eye exhibits no discharge. Left eye exhibits no discharge. No scleral icterus.  Neck: Normal range of motion.  Cardiovascular: Normal rate and regular rhythm.  Exam reveals no gallop and no friction rub.   No murmur heard. Pulmonary/Chest: Effort normal and breath sounds normal. No respiratory distress. She has no wheezes. She has no rales. She exhibits no tenderness.  Abdominal: She exhibits no distension.  Lymphadenopathy:    She has no cervical adenopathy.  Neurological: She is alert and oriented to person, place, and time.  Skin: Skin is warm and dry.  Psychiatric: She has a normal mood and affect. Her behavior is normal.  Nursing note and vitals reviewed.    ED Treatments / Results  Labs (all labs ordered are listed, but only abnormal results are displayed) Labs Reviewed - No data to display  EKG  EKG Interpretation None       Radiology No results found.  Procedures Procedures (including critical care  time)  Medications Ordered in ED Medications - No data to display   Initial Impression / Assessment and Plan / ED Course  I have reviewed the triage vital signs and the nursing notes.  Pertinent labs & imaging results that were available during my care of the patient were reviewed by me and considered in my medical decision making (see chart for details).  56 year old female with viral URI. She is mildly hypertensive but otherwise vitals are normal. Exam is overall unremarkable. She is already taking over-the-counter medicines with good relief. Will add Flonase for nasal congestion. Recommend PCP follow-up if symptoms are not improving over the next week. Work note given. Return precautions given.  Final Clinical Impressions(s) / ED Diagnoses   Final diagnoses:  Viral upper respiratory tract  infection    New Prescriptions Discharge Medication List as of 08/30/2016  8:00 AM       Recardo Evangelist, PA-C 08/30/16 [id number]    Forde Dandy, MD 08/30/16 [phone]

## 2016-08-30 NOTE — Discharge Instructions (Signed)
Continue over the counter medicines Add Flonase to help with congestion Return for worsening symptoms

## 2016-08-30 NOTE — ED Notes (Signed)
ED Provider at bedside. 

## 2016-09-13 ENCOUNTER — Other Ambulatory Visit: Payer: Self-pay | Admitting: Internal Medicine

## 2016-09-13 MED FILL — AMLODIPINE BESYLATE 2.5 MG: 2.5 | 30 days supply | Qty: 30 | Fill #0

## 2016-09-13 MED FILL — LISINOPRIL 20 MG TABLET: 20 | 30 days supply | Qty: 30 | Fill #3

## 2016-09-13 MED FILL — OMEPRAZOLE DR 20 MG CAPSULE: 20 | 30 days supply | Qty: 30 | Fill #4

## 2016-10-03 ENCOUNTER — Ambulatory Visit (INDEPENDENT_AMBULATORY_CARE_PROVIDER_SITE_OTHER): Payer: BLUE CROSS/BLUE SHIELD | Admitting: Internal Medicine

## 2016-10-03 ENCOUNTER — Encounter: Payer: Self-pay | Admitting: Internal Medicine

## 2016-10-03 DIAGNOSIS — M25512 Pain in left shoulder: Secondary | ICD-10-CM | POA: Diagnosis not present

## 2016-10-03 NOTE — Patient Instructions (Signed)
Ms. Coccia,  The lidocaine should work for the next couple of hours to numb the pain. The steroid will take a day or two to start working. You will likely be sore in that area for the next day or so. If you notice an redness, warmth or have any fevers or chills please let us know. If the pain isn't doing any better in a week or two please call the clinic.  Follow up with Dr. Lynnae January.  Shoulder Injection, Care After Refer to this sheet in the next few weeks. These instructions provide you with information about caring for yourself after your procedure. Your health care provider may also give you more specific instructions. Your treatment has been planned according to current medical practices, but problems sometimes occur. Call your health care provider if you have any problems or questions after your procedure. What can I expect after the procedure? After the procedure, it is common to have:  Soreness.  Warmth.  Swelling. You may have more pain, swelling, and warmth than you did before the injection. This reaction may last for about one day. Follow these instructions at home: Bathing   If you were given a bandage (dressing), keep it dry until your health care provider says it can be removed. Ask your health care provider when you can start showering or taking a bath. Managing pain, stiffness, and swelling   If directed, apply ice to the injection area:  Put ice in a plastic bag.  Place a towel between your skin and the bag.  Leave the ice on for 20 minutes, 2-3 times per day.  Do not apply heat to your knee.  Raise the injection area above the level of your heart while you are sitting or lying down. Activity   Avoid strenuous activities for as long as directed by your health care provider. Ask your health care provider when you can return to your normal activities. General instructions   Take medicines only as directed by your health care provider.  Do not take aspirin or  other over-the-counter medicines unless your health care provider says you can.  Check your injection site every day for signs of infection. Watch for:  Redness, swelling, or pain.  Fluid, blood, or pus.  Follow your health care provider's instructions about dressing changes and removal. Contact a health care provider if:  You have symptoms at your injection site that last longer than two days after your procedure.  You have redness, swelling, or pain in your injection area.  You have fluid, blood, or pus coming from your injection site.  You have warmth in your injection area.  You have a fever.  Your pain is not controlled with medicine. Get help right away if:  Your knee turns very red.  Your knee becomes very swollen.  Your knee pain is severe. This information is not intended to replace advice given to you by your health care provider. Make sure you discuss any questions you have with your health care provider. Document Released: 07/03/2014 Document Revised: 02/16/2016 Document Reviewed: 04/22/2014 Elsevier Interactive Patient Education  2017 [employer].

## 2016-10-03 NOTE — Progress Notes (Signed)
   CC: Shoulder pain  HPI:  [name] is a 56 y.o. female with a past medical history listed below here today with complaints of shoulder pain.  For details of today's visit and the status of her chronic medical issues please refer to the assessment and plan.   Past Medical History:  Diagnosis Date  . Anemia   . Arthritis   . GERD (gastroesophageal reflux disease)   . Hiatal hernia   . Hyperlipidemia   . Hypertension   . Plantar fasciitis     Review of Systems:   See HPI  Physical Exam:  Vitals:   10/03/16 1048  BP: (!) 153/83  Pulse: 90  Temp: 98.6 F (37 C)  TempSrc: Oral  SpO2: 100%  Weight: 142 lb 9.6 oz (64.7 kg)  Height: 4\' 11"  (1.499 m)   Physical Exam  Constitutional: She is well-developed, well-nourished, and in no distress. No distress.  Cardiovascular: Normal rate and regular rhythm.   Pulmonary/Chest: Effort normal and breath sounds normal.  Musculoskeletal:  Tenderness to palpation over deltoid. Patient has pain on both active and passive movement of laterally raised arm past 90 degrees. Internal and external rotation elicits pain. Drop arm test negative. No weakness or loss of sensation present.      Assessment & Plan:   See Encounters Tab for problem based charting.  Patient seen with Dr. Angelia Mould

## 2016-10-04 NOTE — Assessment & Plan Note (Addendum)
  Reports chronic shoulder and neck problems. Has had injections in the past with some improvement. Says that she woke up 3 days ago and was unable to lift her left arm. Hurts when she tries to raise her arm past 90 degrees. Works as a Secretary/administrator. Cannot remember lifting anything heavy, trauma or pain Friday. Has been taking ibuprofen for pain. Has numbness in her fingers. Reports pain feels like a dull and sharp ache.   Tenderness to palpation over deltoid. Patient has pain on both active and passive movement of laterally raised arm past 90 degrees. Internal and external rotation elicits pain. Drop arm test negative. No weakness or loss of sensation present.   POC US done at bedside with Dr. Heber  does not show any muscle or tendon tears or fluid collections.   Assessment: Left shoulder pain  Plan: Her description and exam most concerning for shoulder impingement. Does have a history of cervical radiculopathy but symptoms do not fit with this picture. Will proceed with shoulder injection today. If not resolution of symptoms, return to clinic.  Shoulder Injection Procedure Note  Pre-operative Diagnosis: left shoulder  Indications: Unexplained monoarthritis  Anesthesia: Lidocaine 1% without epinephrine without added sodium bicarbonate  Procedure Details   Point of care ultrasound was used to identify the subacromial bursa and evaluate for rotator cuff tendinopathy or tears. Consent was obtained for the procedure. The shoulder was prepped with iodine. Using a 22 gauge needle the subacromial bursa was injected with 2 mL 1% lidocaine and 20 mg triamcinolone (KENALOG) 40mg /ml. The needle was removed and a dressing was applied.  Complications:  None; patient tolerated the procedure well.

## 2016-10-09 NOTE — Progress Notes (Signed)
Internal Medicine Clinic Attending  I saw and evaluated the patient.  I personally confirmed the key portions of the history and exam documented by Dr. Boswell and I reviewed pertinent patient test results.  The assessment, diagnosis, and plan were formulated together and I agree with the documentation in the resident's note. I was present for the entire procedure.  

## 2016-10-13 ENCOUNTER — Telehealth: Payer: Self-pay

## 2016-10-13 NOTE — Telephone Encounter (Signed)
appt made for tues 4/24 at 1545 [facility] pain in shoulder, neck

## 2016-10-13 NOTE — Telephone Encounter (Signed)
Pt needs to speak with a nurse. Please call back.  

## 2016-10-16 ENCOUNTER — Telehealth: Payer: Self-pay | Admitting: Internal Medicine

## 2016-10-16 MED FILL — AMLODIPINE BESYLATE 2.5 MG: 2.5 | 30 days supply | Qty: 30 | Fill #1

## 2016-10-16 MED FILL — LISINOPRIL 20 MG TABLET: 20 | 30 days supply | Qty: 30 | Fill #4

## 2016-10-16 MED FILL — OMEPRAZOLE DR 20 MG CAPSULE: 20 | 30 days supply | Qty: 30 | Fill #5

## 2016-10-16 NOTE — Telephone Encounter (Signed)
APT. REMINDER CALL, LMTCB °

## 2016-10-17 ENCOUNTER — Ambulatory Visit (HOSPITAL_COMMUNITY)
Admission: RE | Admit: 2016-10-17 | Discharge: 2016-10-17 | Disposition: A | Discharge status: Home / Self Care | Payer: BLUE CROSS/BLUE SHIELD | Source: Ambulatory Visit | Attending: Internal Medicine | Admitting: Internal Medicine

## 2016-10-17 ENCOUNTER — Ambulatory Visit (INDEPENDENT_AMBULATORY_CARE_PROVIDER_SITE_OTHER): Payer: BLUE CROSS/BLUE SHIELD | Admitting: Internal Medicine

## 2016-10-17 ENCOUNTER — Encounter (INDEPENDENT_AMBULATORY_CARE_PROVIDER_SITE_OTHER): Payer: Self-pay

## 2016-10-17 VITALS — BP 149/84 | HR 85 | Temp 98.5°F | Ht 59.0 in | Wt 140.0 lb

## 2016-10-17 DIAGNOSIS — M19012 Primary osteoarthritis, left shoulder: Secondary | ICD-10-CM | POA: Insufficient documentation

## 2016-10-17 DIAGNOSIS — M25512 Pain in left shoulder: Secondary | ICD-10-CM

## 2016-10-17 MED ORDER — TRAMADOL HCL 50 MG PO TABS: 50.0000 mg | ORAL_TABLET | Freq: Four times a day (QID) | ORAL | 0 refills | Status: DC | PRN

## 2016-10-17 MED ORDER — IBUPROFEN 600 MG PO TABS: 600.0000 mg | ORAL_TABLET | Freq: Four times a day (QID) | ORAL | 0 refills | Status: DC | PRN

## 2016-10-17 MED FILL — IBUPROFEN 600 MG TABLET: 600 | 8 days supply | Qty: 30 | Fill #0

## 2016-10-17 NOTE — Assessment & Plan Note (Addendum)
[name] was seen on 4/10 with complaints of right shoulder pain. She had a point of care ultrasound done at that time with no evidence of tendinopathy or tears noted. Had a steroid injection at that time.  Today, she reports that she had improvement in her shoulder pain for 1-2 days but symptoms returned as soon as she went back to work. 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 Denies any weakness or numbness. Pain is elicited when trying to extend her arm over her head.   She reports some benefit at work with Ibuprofen. Biggest complaint is difficulty sleeping due to pain and discomfort.   XR today with mid [facility] joint degenerative changes. Joint space is open with no findings that would explain her impingement symptoms. No acromion spurring present.   Plan: Unfortunately no obvious or treatable source of her pain today with no response to steroid injection. Suspect this will be an ongoing and difficult to treat problem.  Will refer to PT today. Will give Tramadol 50 mg q6 hr prn #30 today.

## 2016-10-17 NOTE — Progress Notes (Signed)
   CC: Left shoulder pain  HPI:  [name] is a 56 y.o. female with a past medical history listed below here today for left shoulder pain.  For details of today's visit and the status of her chronic medical issues please refer to the assessment and plan.   Past Medical History:  Diagnosis Date  . Anemia   . Arthritis   . GERD (gastroesophageal reflux disease)   . Hiatal hernia   . Hyperlipidemia   . Hypertension   . Plantar fasciitis     Review of Systems:   No chest pain or shortness of breath  Physical Exam:  Vitals:   10/17/16 1542  BP: (!) 149/84  Pulse: 85  Temp: 98.5 F (36.9 C)  TempSrc: Oral  SpO2: 100%  Weight: 140 lb (63.5 kg)  Height: 4\' 11"  (1.499 m)   Physical Exam  Constitutional: No distress.  Cardiovascular: Normal rate and regular rhythm.   Pulmonary/Chest: Effort normal and breath sounds normal.  Abdominal: Soft. Bowel sounds are normal.  Musculoskeletal:       Left shoulder: She exhibits decreased range of motion and tenderness. She exhibits no bony tenderness and no swelling.  Full ROM with internal and external rotation. Abduction on left shoulder limited to about 90 degrees due to pain. No weakness. Intact sensation.   Skin: Skin is warm and dry.  Vitals reviewed.    Assessment & Plan:   See Encounters Tab for problem based charting.  Patient discussed with Dr. Lynnae January

## 2016-10-17 NOTE — Patient Instructions (Signed)
[name],  I am going to get an xray of your shoulder toady and get you set up with physical therapy.   I will give you a short course of tramadol to help you sleep at night.  Please follow up with Dr. Lynnae January in 1-2 months.

## 2016-10-18 ENCOUNTER — Telehealth: Payer: Self-pay | Admitting: *Deleted

## 2016-10-18 MED FILL — traMADol HCL 50 MG TABS: 50 | 7 days supply | Qty: 30 | Fill #0

## 2016-10-18 NOTE — Telephone Encounter (Signed)
Pt would like to have her therapy at her work place, this way she doesn't have to miss work, she can have it on her breaks, she has cleared it at work

## 2016-10-18 NOTE — Telephone Encounter (Signed)
Made dr Charlynn Grimes and gladys verbally aware

## 2016-10-20 NOTE — Progress Notes (Signed)
Internal Medicine Clinic Attending  I saw and evaluated the patient.  I personally confirmed the key portions of the history and exam documented by Dr. Boswell and I reviewed pertinent patient test results.  The assessment, diagnosis, and plan were formulated together and I agree with the documentation in the resident's note. 

## 2016-10-24 ENCOUNTER — Encounter: Payer: Self-pay | Admitting: Internal Medicine

## 2016-11-01 ENCOUNTER — Emergency Department (HOSPITAL_COMMUNITY)
Admission: EM | Admit: 2016-11-01 | Discharge: 2016-11-01 | Disposition: A | Discharge status: Home / Self Care | Payer: BLUE CROSS/BLUE SHIELD | Attending: Emergency Medicine | Admitting: Emergency Medicine

## 2016-11-01 ENCOUNTER — Encounter (HOSPITAL_COMMUNITY): Payer: Self-pay | Admitting: *Deleted

## 2016-11-01 DIAGNOSIS — I10 Essential (primary) hypertension: Secondary | ICD-10-CM | POA: Diagnosis not present

## 2016-11-01 DIAGNOSIS — Z79899 Other long term (current) drug therapy: Secondary | ICD-10-CM | POA: Diagnosis not present

## 2016-11-01 DIAGNOSIS — Z8673 Personal history of transient ischemic attack (TIA), and cerebral infarction without residual deficits: Secondary | ICD-10-CM | POA: Insufficient documentation

## 2016-11-01 DIAGNOSIS — R197 Diarrhea, unspecified: Secondary | ICD-10-CM | POA: Insufficient documentation

## 2016-11-01 DIAGNOSIS — F17210 Nicotine dependence, cigarettes, uncomplicated: Secondary | ICD-10-CM | POA: Diagnosis not present

## 2016-11-01 DIAGNOSIS — R112 Nausea with vomiting, unspecified: Secondary | ICD-10-CM

## 2016-11-01 LAB — COMPREHENSIVE METABOLIC PANEL
ALK PHOS: 78 U/L (ref 38–126)
ALT: 35 U/L (ref 14–54)
ANION GAP: 9 (ref 5–15)
AST: 26 U/L (ref 15–41)
Albumin: 3.8 g/dL (ref 3.5–5.0)
BUN: 17 mg/dL (ref 6–20)
CALCIUM: 9 mg/dL (ref 8.9–10.3)
CO2: 24 mmol/L (ref 22–32)
Chloride: 107 mmol/L (ref 101–111)
Creatinine, Ser: 0.63 mg/dL (ref 0.44–1.00)
GFR calc Af Amer: >60 mL/min (ref >60)
GFR calc non Af Amer: >60 mL/min (ref >60)
GLUCOSE: 107 mg/dL — AB (ref 65–99)
Potassium: 3.2 mmol/L — ABNORMAL LOW (ref 3.5–5.1)
SODIUM: 140 mmol/L (ref 135–145)
TOTAL PROTEIN: 6.8 g/dL (ref 6.5–8.1)
Total Bilirubin: 0.3 mg/dL (ref 0.3–1.2)

## 2016-11-01 LAB — CBC
HCT: 34.2 % — ABNORMAL LOW (ref 36.0–46.0)
HEMOGLOBIN: 10.6 g/dL — AB (ref 12.0–15.0)
MCH: 21.8 pg — AB (ref 26.0–34.0)
MCHC: 31 g/dL (ref 30.0–36.0)
MCV: 70.4 fL — ABNORMAL LOW (ref 78.0–100.0)
Platelets: 288 10*3/uL (ref 150–400)
RBC: 4.86 MIL/uL (ref 3.87–5.11)
RDW: 14.6 % (ref 11.5–15.5)
WBC: 6.6 10*3/uL (ref 4.0–10.5)

## 2016-11-01 LAB — LIPASE, BLOOD: Lipase: 23 U/L (ref 11–51)

## 2016-11-01 MED ORDER — ONDANSETRON 4 MG PO TBDP: 4.0000 mg | ORAL_TABLET | Freq: Three times a day (TID) | ORAL | 0 refills | Status: DC | PRN

## 2016-11-01 NOTE — ED Provider Notes (Signed)
[facility] DEPT Provider Note   CSN: [id number] Arrival date & time: 11/01/16  1445 By signing my name below, I, Marcello Moores, attest that this documentation has been prepared under the direction and in the presence of Carmin Muskrat, MD. Electronically Signed: Marcello Moores, ED Scribe. 11/01/16. 5:11 PM.   History   Chief Complaint Chief Complaint  Patient presents with  . Abdominal Pain    The history is provided by the patient. No language interpreter was used.   HPI Comments: [name] is a 56 y.o. female who presents to the Emergency Department complaining of gradually resolving intermittent episodes of diarrhea x5 s/p eating seafood today with her cousin. Pt reports associated abdominal cramping that has now resolved, nausea and vomiting x3. An episode of diarrhea immediately followed her consumption of the food. She ate with cousin who refused to finish food because "it didn't taste right." Pt states she felt otherwise well leading up to the meal. She has h/o GERD and hiatal hernia. Pt denies fever, confusion, syncope and additional complaints.   Past Medical History:  Diagnosis Date  . Anemia   . Arthritis   . GERD (gastroesophageal reflux disease)   . Hiatal hernia   . Hyperlipidemia   . Hypertension   . Plantar fasciitis     Patient Active Problem List   Diagnosis Date Noted  . Radicular pain of right lower extremity 07/07/2016  . H/O TIA (transient ischemic attack) and stroke 06/12/2014  . Cervical radiculitis 02/12/2014  . Healthcare maintenance 04/30/2013  . Shoulder pain, left 12/01/2011  . Essential hypertension, benign 01/24/2010  . Anemia 01/11/2010  . Hyperlipemia 05/28/2007  . TOBACCO ABUSE 05/28/2007  . GERD 05/28/2007    Past Surgical History:  Procedure Laterality Date  . ABDOMINAL HYSTERECTOMY  12/09   Secondary to fibroids  . CESAREAN SECTION      OB History    No data available       Home Medications    Prior to  Admission medications   Medication Sig Start Date End Date Taking? Authorizing Provider  amLODipine (NORVASC) 2.5 MG tablet TAKE 1 TABLET BY MOUTH DAILY 09/13/16   Bartholomew Crews, MD  diazepam (VALIUM) 5 MG tablet Take one pill one hour prior to MRI. Take second pill if there is a delay. Someone needs to drive you to MRI. 0/62/69   Bartholomew Crews, MD  diclofenac sodium (VOLTAREN) 1 % GEL Apply 2 g topically 4 (four) times daily. 03/01/16   Bartholomew Crews, MD  ferrous sulfate (IRON SUPPLEMENT) 325 (65 FE) MG tablet Take 1 tablet (325 mg total) by mouth daily with breakfast. 03/01/16   Bartholomew Crews, MD  ibuprofen (ADVIL,MOTRIN) 600 MG tablet Take 1 tablet (600 mg total) by mouth every 6 (six) hours as needed. 10/17/16   Maryellen Pile, MD  lisinopril (PRINIVIL,ZESTRIL) 20 MG tablet Take 1 tablet (20 mg total) by mouth daily. 06/05/16   Bartholomew Crews, MD  omeprazole (PRILOSEC) 20 MG capsule Take 1 capsule (20 mg total) by mouth daily. 03/01/16   Bartholomew Crews, MD  traMADol (ULTRAM) 50 MG tablet Take 1 tablet (50 mg total) by mouth every 6 (six) hours as needed. 10/17/16   Maryellen Pile, MD    Family History Family History  Problem Relation Age of Onset  . Cancer Mother 37    pancreatic cancer  . Pancreatic cancer Mother   . Cancer Father 35    throat cancer  . Cancer Maternal  Aunt 60    breast cancer  . Cancer Other     Died  from Colon cancer in 35's  . Colon cancer Maternal Uncle   . Colon cancer Paternal Uncle     Social History Social History  Substance Use Topics  . Smoking status: Current Every Day Smoker    Packs/day: 0.10    Types: Cigarettes  . Smokeless tobacco: Never Used     Comment: Down to 1-2 cig/day.  . Alcohol use Yes     Comment: occasionally     Allergies   Aspirin and Hydromorphone hcl   Review of Systems Review of Systems  Constitutional: Negative for fever.  Respiratory: Negative for cough.   Cardiovascular:  Negative for chest pain.  Gastrointestinal: Positive for abdominal pain, diarrhea, nausea and vomiting.  Endocrine: Negative for polyuria.  Genitourinary: Negative.   Musculoskeletal: Negative.   Allergic/Immunologic: Negative for immunocompromised state.  Neurological: Negative for syncope.  Psychiatric/Behavioral: Negative for confusion.     Physical Exam Updated Vital Signs BP 125/64 (BP Location: Right Arm)   Pulse 73   Temp 98.6 F (37 C) (Oral)   Resp 18   SpO2 100%   Physical Exam  Constitutional: She is oriented to person, place, and time. She appears well-developed and well-nourished. No distress.  HENT:  Head: Normocephalic and atraumatic.  Eyes: Conjunctivae and EOM are normal.  Cardiovascular: Normal rate and regular rhythm.   Pulmonary/Chest: Effort normal and breath sounds normal. No stridor. No respiratory distress.  Abdominal: She exhibits no distension. There is no tenderness.  Musculoskeletal: She exhibits no edema.  Neurological: She is alert and oriented to person, place, and time. No cranial nerve deficit.  Skin: Skin is warm and dry.  Psychiatric: She has a normal mood and affect.  Nursing note and vitals reviewed.    ED Treatments / Results   DIAGNOSTIC STUDIES: Oxygen Saturation is 100% on RA, normal by my interpretation.   COORDINATION OF CARE: 5:00 PM-Discussed next steps with pt. Pt verbalized understanding and is agreeable with the plan.    Labs (all labs ordered are listed, but only abnormal results are displayed) Labs Reviewed  COMPREHENSIVE METABOLIC PANEL - Abnormal; Notable for the following:       Result Value   Potassium 3.2 (*)    Glucose, Bld 107 (*)    All other components within normal limits  CBC - Abnormal; Notable for the following:    Hemoglobin 10.6 (*)    HCT 34.2 (*)    MCV 70.4 (*)    MCH 21.8 (*)    All other components within normal limits  LIPASE, BLOOD  URINALYSIS, ROUTINE W REFLEX MICROSCOPIC     Procedures Procedures (including critical care time)   Initial Impression / Assessment and Plan / ED Course  I have reviewed the triage vital signs and the nursing notes.  Pertinent labs & imaging results that were available during my care of the patient were reviewed by me and considered in my medical decision making (see chart for details).  Well-appearing young female presents after several hours of nausea, vomiting, diarrhea. Patient has notable likely precipitant, and has improved substantially since the most severe.  Of her illness. Here she is awake, alert, with soft, non-peritoneal abdomen, no fever, no evidence for peritonitis, bacteremia, sepsis. She does have mild hypokalemia, but will undertake oral repletion. Patient encouraged to drink plenty of fluids, get plenty of rest, avoid similar precipitant.   Final Clinical Impressions(s) / ED Diagnoses  Final diagnoses:  Nausea vomiting and diarrhea    New Prescriptions New Prescriptions   ONDANSETRON (ZOFRAN ODT) 4 MG DISINTEGRATING TABLET    Take 1 tablet (4 mg total) by mouth every 8 (eight) hours as needed for nausea or vomiting.    I personally performed the services described in this documentation, which was scribed in my presence. The recorded information has been reviewed and is accurate.        Carmin Muskrat, MD 11/01/16 1719

## 2016-11-01 NOTE — Discharge Instructions (Signed)
As discussed, your evaluation today has been largely reassuring.  But, it is important that you monitor your condition carefully, and do not hesitate to return to the ED if you develop new, or concerning changes in your condition. ? ?Otherwise, please follow-up with your physician for appropriate ongoing care. ? ?

## 2016-11-01 NOTE — ED Triage Notes (Signed)
Pt arrived by [facility]. Onset of abd pain and n/v/d after eating seafood.

## 2016-11-21 MED FILL — AMLODIPINE BESYLATE 2.5 MG: 2.5 | 30 days supply | Qty: 30 | Fill #2

## 2016-11-21 MED FILL — LISINOPRIL 20 MG TABLET: 20 | 30 days supply | Qty: 30 | Fill #5

## 2016-11-21 MED FILL — OMEPRAZOLE DR 20 MG CAPSULE: 20 | 30 days supply | Qty: 30 | Fill #6

## 2016-11-23 ENCOUNTER — Ambulatory Visit: Payer: BLUE CROSS/BLUE SHIELD | Admitting: Internal Medicine

## 2016-11-23 ENCOUNTER — Encounter: Payer: Self-pay | Admitting: Internal Medicine

## 2016-11-23 ENCOUNTER — Ambulatory Visit (INDEPENDENT_AMBULATORY_CARE_PROVIDER_SITE_OTHER): Payer: BLUE CROSS/BLUE SHIELD | Admitting: Internal Medicine

## 2016-11-23 DIAGNOSIS — M79671 Pain in right foot: Secondary | ICD-10-CM

## 2016-11-23 MED ORDER — IBUPROFEN 600 MG PO TABS: 600.0000 mg | ORAL_TABLET | Freq: Four times a day (QID) | ORAL | 0 refills | Status: DC | PRN

## 2016-11-23 MED FILL — IBUPROFEN 600 MG TABLET: 600 | 7 days supply | Qty: 30 | Fill #0

## 2016-11-23 NOTE — Assessment & Plan Note (Addendum)
Her right foot pain is likely 2/2 to posterior tibialis tendonitis. Low suspicion for other causes such as stress fracture or gout.  Will treat supportively with RICE and ibuprofen.  Has appt with PCP next week, this can be re-assess at that time.   If not improving, could consider imaging, steroid injection, and/or PT.

## 2016-11-23 NOTE — Progress Notes (Signed)
   CC: R foot pain   HPI:  [name] is a 56 y.o. w/ PMh as listed below is here for R foot pain since today.  she woke up with the pain, it's on medial aspect of her right foot, has tenderness over the area, and pain radiates towards the toes and has some tingling sensation. No injuries or falls. Did spend more time on her feet recently as she did a double shift at work on Tuesday. No hx of gout. No new shoes.    Past Medical History:  Diagnosis Date  . Anemia   . Arthritis   . GERD (gastroesophageal reflux disease)   . Hiatal hernia   . Hyperlipidemia   . Hypertension   . Plantar fasciitis     Review of Systems:   Review of Systems  Constitutional: Negative for chills and fever.  Respiratory: Negative for cough.   Cardiovascular: Negative for chest pain.  Gastrointestinal: Negative for heartburn, nausea and vomiting.  Musculoskeletal: Positive for joint pain.  Neurological: Negative for dizziness and headaches.     Physical Exam:  Vitals:   11/23/16 1534  BP: 118/75  Pulse: 78  Temp: 98.5 F (36.9 C)  TempSrc: Oral  SpO2: 100%  Weight: 145 lb (65.8 kg)   Physical Exam  Constitutional: She is oriented to person, place, and time. She appears well-developed and well-nourished.  Respiratory: Effort normal and breath sounds normal. No respiratory distress. She has no wheezes.  Musculoskeletal: Normal range of motion. She exhibits no edema or tenderness.  Has tenderness over the medial aspect of the right midfoot over the posterior tibialis tendon. Negative Tinel's test. No skin break down, no warmth, swelling, or erythema.   Neurological: She is alert and oriented to person, place, and time.    Assessment & Plan:   See Encounters Tab for problem based charting.  Patient discussed with Dr. Daryll Drown

## 2016-11-23 NOTE — Patient Instructions (Addendum)
Take ibuprofen every 6 hours for next 1 week  Rest, ice, compress, and elevate as we discussed  Let Dr. Lynnae January know how you are feeling next week's visit.    Posterior Tibialis Tendinosis Posterior tibialis tendinosis is irritation and degeneration of a tendon called the posterior tibial tendon. Your posterior tibial tendon is a cord-like tissue that connects bones of your lower leg and foot to a muscle that:  Supports your arch.  Helps you raise up on your toes.  Helps you turn your foot down and in.  This condition causes foot and ankle pain and can lead to a flat foot. What are the causes? This condition is most often caused by repeated stress to the tendon (overuse injury). It can also be caused by a sudden injury that stresses the tendon, such as landing on your foot after jumping or falling. What increases the risk? This condition is more likely to develop in:  People who play a sport that involves putting a lot of pressure on the feet, such as: ? Basketball. ? Tennis. ? Soccer. ? Hockey.  Runners.  Females who are older than 40 years and are overweight.  People with diabetes.  People with decreased foot stability (ligamentous laxity).  People with flat feet.  What are the signs or symptoms? Symptoms of this condition may start suddenly or gradually. Symptoms include:  Pain in the inner ankle.  Pain at the arch of your foot.  Pain that gets worse with running, walking, or standing.  Swelling on the inside of your ankle and foot.  Weakness in your ankle or foot.  Inability to stand up on tiptoe.  How is this diagnosed? This condition may be diagnosed based on:  Your symptoms.  Your medical history.  A physical exam.  Tests, such as: ? An X-ray. ? MRI. ? An ultrasound.  During the physical exam, your health care provider may move your foot and ankle, test your strength and balance, and check the arch of your foot while you stand or walk. How is  this treated? This condition may be treated by:  Replacing high-impact exercise with low-impact exercise, such as swimming or cycling.  Applying ice to the injured area.  Taking an anti-inflammatory pain medicine.  Physical therapy.  Wearing a special shoe or shoe insert to support your arch (orthotic).  If your symptoms do not improve with these treatments, you may need to wear a splint, removable walking boot, or short leg cast for 6-8 weeks to keep your foot and ankle still. Follow these instructions at home: If you have a boot or splint:  Wear the boot or splint as told by your health care provider. Remove it only as told by your health care provider.  Do not use your foot to support (bear) your full body weight until your health care provider says that you can.  Loosen the boot or splint if your toes tingle, become numb, or turn cold and blue.  Keep the boot or splint clean.  If your boot or splint is not waterproof: ? Do not let it get wet. ? Cover it with a watertight plastic bag when you take a bath or shower. If you have a cast:  Do not stick anything inside the cast to scratch your skin. Doing that increases your risk of infection.  Check the skin around the cast every day. Tell your health care provider about any concerns.  You may put lotion on dry skin around the edges  of the cast. Do not apply lotion to the skin underneath the cast.  Keep the cast clean.  Do not take baths, swim, or use a hot tub until your health care provider approves. Ask your health care provider if you can take showers. You may only be allowed to take sponge baths for bathing.  If your cast is not waterproof: ? Do not let it get wet. ? Cover it with a watertight plastic bag while you take a bath or a shower. Managing pain and swelling  Take over-the-counter and prescription medicines only as told by your health care provider.  If directed, apply ice to the injured area: ? Put ice in  a plastic bag. ? Place a towel between your skin and the bag. ? Leave the ice on for 20 minutes, 2-3 times a day.  Raise (elevate) your ankle above the level of your heart when resting if you have swelling. Activity  Do not do activities that make pain or swelling worse.  Return to full activity gradually as symptoms improve.  Do exercises as told by your health care provider. General instructions  If you have an orthotic, use it as told by your health care provider.  Keep all follow-up visits as told by your health care provider. This is important. How is this prevented?  Wear footwear that is appropriate to your athletic activity.  Avoid athletic activities that cause pain or swelling in your ankle or foot.  Before being active, do range-of-motion and stretching exercises.  If you develop pain or swelling while training, stop training.  If you have pain or swelling that does not improve after a few days of rest, see your health care provider.  If you start a new athletic activity, start gradually so you can build up your strength and flexibility. Contact a health care provider if:  Your symptoms get worse.  Your symptoms do not improve in 6-8 weeks.  You develop new, unexplained symptoms.  Your splint, boot, or cast gets damaged. This information is not intended to replace advice given to you by your health care provider. Make sure you discuss any questions you have with your health care provider. Document Released: 06/12/2005 Document Revised: 02/15/2016 Document Reviewed: 02/26/2015 Elsevier Interactive Patient Education  [employer].

## 2016-11-27 NOTE — Addendum Note (Signed)
Addended by: Hulan Fray on: 11/27/2016 04:24 PM   Modules accepted: Orders

## 2016-11-27 NOTE — Progress Notes (Signed)
Internal Medicine Clinic Attending  Case discussed with Dr. Ahmed at the time of the visit.  We reviewed the resident's history and exam and pertinent patient test results.  I agree with the assessment, diagnosis, and plan of care documented in the resident's note. 

## 2016-11-30 ENCOUNTER — Ambulatory Visit (INDEPENDENT_AMBULATORY_CARE_PROVIDER_SITE_OTHER): Payer: BLUE CROSS/BLUE SHIELD | Admitting: Internal Medicine

## 2016-11-30 ENCOUNTER — Encounter: Payer: Self-pay | Admitting: Internal Medicine

## 2016-11-30 DIAGNOSIS — K219 Gastro-esophageal reflux disease without esophagitis: Secondary | ICD-10-CM | POA: Diagnosis not present

## 2016-11-30 DIAGNOSIS — M25512 Pain in left shoulder: Secondary | ICD-10-CM | POA: Diagnosis not present

## 2016-11-30 DIAGNOSIS — M79671 Pain in right foot: Secondary | ICD-10-CM | POA: Diagnosis not present

## 2016-11-30 DIAGNOSIS — I10 Essential (primary) hypertension: Secondary | ICD-10-CM | POA: Diagnosis not present

## 2016-11-30 DIAGNOSIS — F17210 Nicotine dependence, cigarettes, uncomplicated: Secondary | ICD-10-CM

## 2016-11-30 DIAGNOSIS — Z0000 Encounter for general adult medical examination without abnormal findings: Secondary | ICD-10-CM

## 2016-11-30 DIAGNOSIS — D500 Iron deficiency anemia secondary to blood loss (chronic): Secondary | ICD-10-CM

## 2016-11-30 DIAGNOSIS — Z800 Family history of malignant neoplasm of digestive organs: Secondary | ICD-10-CM

## 2016-11-30 MED ORDER — TRAMADOL HCL 50 MG PO TABS: 50.0000 mg | ORAL_TABLET | Freq: Four times a day (QID) | ORAL | 0 refills | Status: DC | PRN

## 2016-11-30 NOTE — Assessment & Plan Note (Signed)
This problem is chronic and stable. She takes her PPI daily and has no symptoms or side effects.  PLAN:  Cont current meds

## 2016-11-30 NOTE — Assessment & Plan Note (Signed)
This problem is improved. She had a steroid injection into her left shoulder in April and states that her pain is now better.

## 2016-11-30 NOTE — Assessment & Plan Note (Addendum)
This problem is chronic and stable. Her anemia is microcytic with a recent MCV is 70 and normal RDW. Her most recent hemoglobin was 10.6 which is at baseline. Her most recent ferritin was 121 but her diagnostic ferritin was 18 in 2008. She has had a colonoscopy in 2015 that was normal but she does have a history of uterine fibroids. She takes iron supplementation. At her next blood draw, I will need to get a repeat ferritin to see if she needs to continue supplemental iron.  PLAN:  Cont current meds Repeat ferritin next blood draw.

## 2016-11-30 NOTE — Assessment & Plan Note (Signed)
She will schedule her mammogram. She is up-to-date on her colonoscopy. She wants to get an appointment with her eye doctor as she is having visual difficulties up close.

## 2016-11-30 NOTE — Assessment & Plan Note (Signed)
This problem is chronic and worsened today. She is on amlodipine 2.5 and lisinopril 20. When she checked her blood pressure at home prior to the current pain it was 120/56. She has no side effects to these medications and thinks that her blood pressure is currently elevated due to pain.  Assessment: Recently well controlled blood pressure but slightly elevated on today's examination. It is also well controlled at home. I think the elevated blood pressure today is due to pain. Will continue current medications and she has agreed to call me if her blood pressure remains elevated in 1-2 weeks.  PLAN:  Cont current meds     BP Readings from Last 3 Encounters:  11/30/16 (!) 156/98  11/23/16 118/75  11/01/16 122/70

## 2016-11-30 NOTE — Patient Instructions (Signed)
1. Try a preservative free eye gel or drop for your watery eyes. 2. Sch and appt with an eye doctor 3. Continue ibuprofen and as needed tramadol for your R foot pain. May also try tylenol. If not better call me and I will send you to a foot doctor 4. I will look into pancreatic cancer screening

## 2016-11-30 NOTE — Assessment & Plan Note (Addendum)
This problem is chronic and stable. She has had this pain for about 2 weeks. It started suddenly one morning when she woke up. It's located in the right arch area. The first day she wrapped it with an Ace bandage bat she had to take that off later at work because it was making it worse. The pain is only present when she is weightbearing. When she is not weightbearing she has no pain. The ibuprofen, ice, and the tramadol has made the pain less sore and is not as bad. The left foot also hurts but is not nearly as bad as the right foot. She saw [facility] recently and got a diagnosis of posterior tibial tendinitis. On exam today, there is no gross abnormality of her foot. There is no MCP pain, erythema, or tenderness. There is no tenderness to palpation over the arch nor along the Achilles tendon or the plantar surface of the heel. She is making improvement with conservative treatment including a nonsteroidal, massage, and ice. I intend to continue this treatment but recommended that she add Tylenol as an additional pain reliever. We discussed that if she is not better in about a week that she is to call me and I will refer her to podiatry. I gave her a work note to be off today and also June 8 to return to work on the ninth.  PLAN : As needed ibuprofen, acetaminophen, tramadol, massage, and ice. To call if no better in a week and I will refer her to podiatry

## 2016-11-30 NOTE — Progress Notes (Signed)
   Subjective:    Patient ID: [name], female    DOB: 09-06-1960, 56 y.o.   MRN: [id number]  HPI  [name] is here for R foot pain. Please see the A&P for the status of the pt's chronic medical problems.  ROS : per ROS section and in problem oriented charting. All other systems are negative.  PMHx, Soc hx, and / or Fam hx : Her mother, her maternal uncle, and her maternal aunt all had pancreatic cancer. Her father had esophageal cancer. 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   Review of Systems  Constitutional: Negative for activity change, appetite change and unexpected weight change.  HENT: Negative for rhinorrhea and sneezing.   Eyes: Positive for photophobia, discharge and visual disturbance. Negative for itching.  Musculoskeletal: Positive for back pain, gait problem and myalgias.  Neurological: Negative for dizziness and light-headedness.  Psychiatric/Behavioral: Negative for sleep disturbance.       Objective:   Physical Exam  Constitutional: She appears well-developed and well-nourished. No distress.  HENT:  Head: Normocephalic and atraumatic.  Right Ear: External ear normal.  Left Ear: External ear normal.  Nose: Nose normal.  Eyes: Conjunctivae and EOM are normal. Right eye exhibits no discharge. Left eye exhibits no discharge. No scleral icterus.  Protuberant bilaterally on lateral examination. Slight proptosis. No erythema.  Cardiovascular: Normal rate, regular rhythm and normal heart sounds.   No murmur heard. Pulmonary/Chest: Effort normal and breath sounds normal. No respiratory distress.  Musculoskeletal: Normal range of motion. She exhibits no edema.  Skin: Skin is warm and dry. She is not diaphoretic.  Psychiatric: She has a normal mood and affect. Her behavior is normal. Judgment and thought content normal.          Assessment & Plan:

## 2016-12-08 ENCOUNTER — Telehealth: Payer: Self-pay

## 2016-12-08 ENCOUNTER — Ambulatory Visit (INDEPENDENT_AMBULATORY_CARE_PROVIDER_SITE_OTHER): Payer: BLUE CROSS/BLUE SHIELD | Admitting: Internal Medicine

## 2016-12-08 VITALS — BP 127/75 | HR 84 | Temp 98.4°F | Ht 59.0 in | Wt 141.3 lb

## 2016-12-08 DIAGNOSIS — M79671 Pain in right foot: Secondary | ICD-10-CM

## 2016-12-08 DIAGNOSIS — M79672 Pain in left foot: Secondary | ICD-10-CM | POA: Diagnosis not present

## 2016-12-08 DIAGNOSIS — M546 Pain in thoracic spine: Secondary | ICD-10-CM | POA: Diagnosis not present

## 2016-12-08 DIAGNOSIS — M25512 Pain in left shoulder: Secondary | ICD-10-CM | POA: Diagnosis not present

## 2016-12-08 DIAGNOSIS — G8929 Other chronic pain: Secondary | ICD-10-CM | POA: Diagnosis not present

## 2016-12-08 DIAGNOSIS — Z79899 Other long term (current) drug therapy: Secondary | ICD-10-CM | POA: Diagnosis not present

## 2016-12-08 DIAGNOSIS — F17210 Nicotine dependence, cigarettes, uncomplicated: Secondary | ICD-10-CM

## 2016-12-08 DIAGNOSIS — I10 Essential (primary) hypertension: Secondary | ICD-10-CM

## 2016-12-08 MED ORDER — CYCLOBENZAPRINE HCL 5 MG PO TABS: 5.0000 mg | ORAL_TABLET | Freq: Three times a day (TID) | ORAL | 0 refills | Status: DC | PRN

## 2016-12-08 MED FILL — traMADol HCL 50 MG TABS: 50 | 8 days supply | Qty: 30 | Fill #0

## 2016-12-08 NOTE — Patient Instructions (Signed)
Thank you for visiting clinic today. It looks like you strain your back muscles. I'm giving you a muscle relaxant, you can use it along with your pain medicines up to 3 times a day. It can make you drowsy please do not drive or operate machinery after using it. You can also use heating pad and [employer] or Ice and Hot rubbing stuff to help ease this pain. I'm also giving you a referral to foot doctor for your heel pain. We are also sending you to sports medicine for your shoulder pain.   Mid-Back Strain Rehab Ask your health care provider which exercises are safe for you. Do exercises exactly as told by your health care provider and adjust them as directed. It is normal to feel mild stretching, pulling, tightness, or discomfort as you do these exercises, but you should stop right away if you feel sudden pain or your pain gets worse. Do not begin these exercises until told by your health care provider. Stretching and range of motion exercises This exercise warms up your muscles and joints and improves the movement and flexibility of your back and shoulders. This exercise also help to relieve pain. Exercise A: Chest and spine stretch  1. Lie down on your back on a firm surface. 2. Roll a towel or a small blanket so it is about 4 inches (10 cm) in diameter. 3. Put the towel lengthwise under the middle of your back so it is under your spine, but not under your shoulder blades. 4. To increase the stretch, you may put your hands behind your head and let your elbows fall to your sides. 5. Hold for __________ seconds. Repeat exercise __________ times. Complete this exercise __________ times a day. Strengthening exercises These exercises build strength and endurance in your back and your shoulder blade muscles. Endurance is the ability to use your muscles for a long time, even after they get tired. Exercise B: Alternating arm and leg raises  1. Get on your hands and knees on a firm surface. If you are  on a hard floor, you may want to use padding to cushion your knees, such as an exercise mat. 2. Line up your arms and legs. Your hands should be below your shoulders, and your knees should be below your hips. 3. Lift your left leg behind you. At the same time, raise your right arm and straighten it in front of you. ? Do not lift your leg higher than your hip. ? Do not lift your arm higher than your shoulder. ? Keep your abdominal and back muscles tight. ? Keep your hips facing the ground. ? Do not arch your back. ? Keep your balance carefully, and do not hold your breath. 4. Hold for __________ seconds. 5. Slowly return to the starting position and repeat with your right leg and your left arm. Repeat __________ times. Complete this exercise __________ times a day. Exercise C: Straight arm rows ( shoulder extension) 1. Stand with your feet shoulder width apart. 2. Secure an exercise band to a stable object in front of you so the band is at or above shoulder height. 3. Hold one end of the exercise band in each hand. 4. Straighten your elbows and lift your hands up to shoulder height. 5. Step back, away from the secured end of the exercise band, until the band stretches. 6. Squeeze your shoulder blades together and pull your hands down to the sides of your thighs. Stop when your hands are straight  down by your sides. Do not let your hands go behind your body. 7. Hold for __________ seconds. 8. Slowly return to the starting position. Repeat __________ times. Complete this exercise __________ times a day. Exercise D: Shoulder external rotation, prone 1. Lie on your abdomen on a firm bed so your left / right forearm hangs over the edge of the bed and your upper arm is on the bed, straight out from your body. ? Your elbow should be bent. ? Your palm should be facing your feet. 2. If instructed, hold a __________ weight in your hand. 3. Squeeze your shoulder blade toward the middle of your back.  Do not let your shoulder lift toward your ear. 4. Keep your elbow bent in an "L" shape (90 degrees) while you slowly move your forearm up toward the ceiling. Move your forearm up to the height of the bed, toward your head. ? Your upper arm should not move. ? At the top of the movement, your palm should face the floor. 5. Hold for __________ seconds. 6. Slowly return to the starting position and relax your muscles. Repeat __________ times. Complete this exercise __________ times a day. Exercise E: Scapular retraction and external rotation, rowing  1. Sit in a stable chair without armrests, or stand. 2. Secure an exercise band to a stable object in front of you so it is at shoulder height. 3. Hold one end of the exercise band in each hand. 4. Bring your arms out straight in front of you. 5. Step back, away from the secured end of the exercise band, until the band stretches. 6. Pull the band backward. As you do this, bend your elbows and squeeze your shoulder blades together, but avoid letting the rest of your body move. Do not let your shoulders lift up toward your ears. 7. Stop when your elbows are at your sides or slightly behind your body. 8. Hold for __________ seconds. 9. Slowly straighten your arms to return to the starting position. Repeat __________ times. Complete this exercise __________ times a day. Posture and body mechanics  Body mechanics refers to the movements and positions of your body while you do your daily activities. Posture is part of body mechanics. Good posture and healthy body mechanics can help to relieve stress in your body's tissues and joints. Good posture means that your spine is in its natural S-curve position (your spine is neutral), your shoulders are pulled back slightly, and your head is not tipped forward. The following are general guidelines for applying improved posture and body mechanics to your everyday activities. Standing   When standing, keep your  spine neutral and your feet about hip-width apart. Keep a slight bend in your knees. Your ears, shoulders, and hips should line up.  When you do a task in which you lean forward while standing in one place for a long time, place one foot up on a stable object that is 2-4 inches (5-10 cm) high, such as a footstool. This helps keep your spine neutral. Sitting   When sitting, keep your spine neutral and keep your feet flat on the floor. Use a footrest, if necessary, and keep your thighs parallel to the floor. Avoid rounding your shoulders, and avoid tilting your head forward.  When working at a desk or a computer, keep your desk at a height where your hands are slightly lower than your elbows. Slide your chair under your desk so you are close enough to maintain good posture.  When  working at a computer, place your monitor at a height where you are looking straight ahead and you do not have to tilt your head forward or downward to look at the screen. Resting  When lying down and resting, avoid positions that are most painful for you.  If you have pain with activities such as sitting, bending, stooping, or squatting (flexion-based activities), lie in a position in which your body does not bend very much. For example, avoid curling up on your side with your arms and knees near your chest (fetal position).  If you have pain with activities such as standing for a long time or reaching with your arms (extension-based activities), lie with your spine in a neutral position and bend your knees slightly. Try the following positions:  Lying on your side with a pillow between your knees.  Lying on your back with a pillow under your knees.  Lifting   When lifting objects, keep your feet at least shoulder-width apart and tighten your abdominal muscles.  Bend your knees and hips and keep your spine neutral. It is important to lift using the strength of your legs, not your back. Do not lock your knees  straight out.  Always ask for help to lift heavy or awkward objects. This information is not intended to replace advice given to you by your health care provider. Make sure you discuss any questions you have with your health care provider. Document Released: 06/12/2005 Document Revised: 02/17/2016 Document Reviewed: 03/24/2015 Elsevier Interactive Patient Education  [employer].

## 2016-12-08 NOTE — Assessment & Plan Note (Signed)
She was also complaining of pain in her heels , she was asking for a referral to podiatry as she was told in the past that if she continued to have heal problem, we will send her to a foot doctor.  -Referral to podiatry was provided.

## 2016-12-08 NOTE — Assessment & Plan Note (Signed)
BP Readings from Last 3 Encounters:  12/08/16 127/75  11/30/16 (!) 156/98  11/23/16 118/75   She was normotensive today.  -Continue current management.

## 2016-12-08 NOTE — Assessment & Plan Note (Signed)
Patient came to the clinic with a complaint of acute mid back pain for last 3 days. According to patient she was doing some heavy cleaning on Tuesday while lifting some heavy weight she twist her back. she was having pain in mid back area. Pain is nonradiating, 8/10 in intensity. She denies any tingling, numbness or focal weakness. She was using some icing, ibuprofen and tramadol with some relief.  On exam, she was having tenderness and tight muscles along right lower thoracic paraspinal region.  -Most likely a musculoskeletal pain.  -Flexeril 5 mg 3 times a day when necessary. -She was advised to keep taking her pain meds as needed. -She was also advised to use heating pad, and/or over-the-counter rubbing stuff.

## 2016-12-08 NOTE — Progress Notes (Signed)
   CC: Back Pain since last 3 days.  HPI:  [name] is a 56 y.o. with past medical history as listed below came to the clinic with a complaint of acute mid back pain for last 3 days.  According to patient she was doing some heavy cleaning on Tuesday while lifting some heavy weight she twist her back. she was having pain in mid back area. Pain is nonradiating, 8/10 in intensity. She denies any tingling, numbness or focal weakness. She was using some icing, ibuprofen and tramadol with some relief.  She was also complaining of worsening of her chronic left shoulder pain, she was given steroid injection in April 2018 , stating that it was helping initially but now it seems to wear off. 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  She was also complaining of pain in her heels , she was asking for a referral to podiatry as she was told in the past that if she continued to have heal problem, we will send her to a foot doctor.  She is a patient of Dr. Lynnae January and she was requesting to see her again today.  Past Medical History:  Diagnosis Date  . Anemia   . Arthritis   . GERD (gastroesophageal reflux disease)   . Hiatal hernia   . Hyperlipidemia   . Hypertension   . Plantar fasciitis     Review of Systems:    Physical Exam:  Vitals:   12/08/16 1405  BP: 127/75  Pulse: 84  Temp: 98.4 F (36.9 C)  TempSrc: Oral  SpO2: 100%  Weight: 141 lb 4.8 oz (64.1 kg)  Height: 4\' 11"  (1.499 m)    General: Vital signs reviewed.  Patient is well-developed and well-nourished, in no acute distress and cooperative with exam.  Cardiovascular: RRR, S1 normal, S2 normal, no murmurs, gallops, or rubs. Pulmonary/Chest: Clear to auscultation bilaterally, no wheezes, rales, or rhonchi. Abdominal: Soft, non-tender, non-distended, BS +, no masses, organomegaly, or guarding present.  Musculoskeletal: Tenderness along right lower thoracic paraspinal area. There was  mildly restricted ROM at left shoulder because of pain. Extremities: No lower extremity edema bilaterally,  pulses symmetric and intact bilaterally. No cyanosis or clubbing.   Assessment & Plan:   See Encounters Tab for problem based charting.  Patient seen with Dr. Lynnae January.

## 2016-12-08 NOTE — Assessment & Plan Note (Signed)
She was also complaining of worsening of her chronic left shoulder pain, she was given steroid injection in April 2018 , stating that it was helping initially but now it seems to wear off. 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  -She is using tramadol and ibuprofen for pain. -She was provided with a referral to sports medicine.

## 2016-12-08 NOTE — Telephone Encounter (Signed)
error 

## 2016-12-11 NOTE — Progress Notes (Signed)
Internal Medicine Clinic Attending  I saw and evaluated the patient.  I personally confirmed the key portions of the history and exam documented by Dr. Amin and I reviewed pertinent patient test results.  The assessment, diagnosis, and plan were formulated together and I agree with the documentation in the resident's note. 

## 2016-12-20 ENCOUNTER — Ambulatory Visit (INDEPENDENT_AMBULATORY_CARE_PROVIDER_SITE_OTHER): Payer: BLUE CROSS/BLUE SHIELD | Admitting: Student

## 2016-12-20 DIAGNOSIS — M7502 Adhesive capsulitis of left shoulder: Secondary | ICD-10-CM | POA: Diagnosis not present

## 2016-12-20 MED FILL — LISINOPRIL 20 MG TABLET: 20 | 30 days supply | Qty: 30 | Fill #6

## 2016-12-20 MED FILL — AMLODIPINE BESYLATE 2.5 MG: 2.5 | 30 days supply | Qty: 30 | Fill #3

## 2016-12-20 MED FILL — OMEPRAZOLE DR 20 MG CAPSULE: 20 | 30 days supply | Qty: 30 | Fill #7

## 2016-12-20 NOTE — Progress Notes (Signed)
  [name] - 56 y.o. female MRN [id number]  Date of birth: October 22, 1960  SUBJECTIVE:  Including CC & ROS.  CC: Left shoulder pain  Presents with left shoulder pain that has been ongoing for the past few months but worsened over the past month. She had been seen previously for left rotator cuff tendinitis. She had a subacromial injection which helped briefly but came back. She has been doing some exercises at home but not consistently. She has been referred to physical therapy but did not end up going. She reports significantly decreased range of motion over the past month. If feels it is getting worse. She is having trouble sleeping at night as well.  She denies any numbness or tingling.    ROS: No unexpected weight loss, fever, chills, swelling, instability, muscle pain, numbness/tingling, redness, otherwise see HPI   PMHx - Updated and reviewed.  Contributory factors include: HTN, GERD, HLD, cervical radiculopathy, h/o TIA PSHx - Updated and reviewed.  Contributory factors include:  Negative FHx - Updated and reviewed.  Contributory factors include:  Negative Social Hx - Updated and reviewed. Contributory factors include: Negative Medications - reviewed   DATA REVIEWED: PCP office visits which showed rotator cuff tendinitis in April, she had an injection at that time as well.  PHYSICAL EXAM:  VS: BP:126/80  HR: bpm  TEMP: ( )  RESP:   HT:4\' 11"  (149.9 cm)   WT:142 lb (64.4 kg)  BMI:28.7 PHYSICAL EXAM: Gen: NAD, alert, cooperative with exam, well-appearing HEENT: clear conjunctiva,  CV:  no edema, capillary refill brisk, normal rate Resp: non-labored Skin: no rashes, normal turgor  Neuro: no gross deficits.  Psych:  alert and oriented  Shoulder: Inspection reveals no abnormalities, atrophy or asymmetry. Palpation is with minimal tenderness over AC joint and bicipital groove, diffusely tender on left. ROM is decreased in all planes, flexion 90 degrees, abduction 80  degrees, ER 25 degrees on left, right with full ROM. Rotator cuff strength unable to assess.    ASSESSMENT & PLAN:   Adhesive capsulitis of left shoulder She likely had rotator cuff tendinitis and due to her not moving her shoulder as much has developed adhesive capsulitis. Reviewed course of disease process. Recommended home exercise program with range of motion exercises. Also recommended injection therapy. She had agreed but then decided she did not want the injection today. She'll come back at another time when she is feeling more up to it.  Would recommend doing high-volume intra-articular glenohumeral injection. Can consider doing this under ultrasound. She will follow-up for this in a couple weeks.

## 2016-12-20 NOTE — Assessment & Plan Note (Addendum)
She likely had rotator cuff tendinitis and due to her not moving her shoulder as much has developed adhesive capsulitis. Reviewed course of disease process. Recommended home exercise program with range of motion exercises. Also recommended injection therapy. She had agreed but then decided she did not want the injection today. She'll come back at another time when she is feeling more up to it.  Would recommend doing high-volume intra-articular glenohumeral injection. Can consider doing this under ultrasound. She will follow-up for this in a couple weeks.

## 2017-01-03 ENCOUNTER — Ambulatory Visit: Payer: BLUE CROSS/BLUE SHIELD | Admitting: Family Medicine

## 2017-01-06 ENCOUNTER — Telehealth: Payer: Self-pay | Admitting: Internal Medicine

## 2017-01-06 NOTE — Telephone Encounter (Signed)
   Reason for call:   I received a call from Ms. [name] at 7:40 AM indicating she is having L sided shoulder and arm pain.    Pertinent Data:   Reports having L sided shoulder and arm pain for 5-6 years  Pain radiates from L side of neck to L shoulder and arm.   States she has had L shoulder steroid injections in the past which did not help  Denies having any numbness or tingling in her L arm  Using tramadol, ibuprofen, and flexeril which are not helping   Given referral to sports med on prior visit (12/08/2016). States she is supposed to make another appointment with sports medicine on Monday for another shoulder injection.    MRI of cervical spine done in 03/2014 showing mild, non-compressive spondylosis at C3-4, C4-5, C5-6 and C7-T1. Moderate spondylosis at C6-7 with endplate osteophytes and shallow protrusion of the disc more towards the left. No compressive central canal stenosis. Foraminal narrowing on the left could possibly affect the C7 nerve root.    Assessment / Plan / Recommendations:   Advised patient to return to the clinic on Monday 6/16 for re-evaluation.   Advised her to continue taking her tramadol, ibuprofen, and flexeril as prescribed  As always, pt is advised that if symptoms worsen or new symptoms arise, they should go to an urgent care facility or to to ER for further evaluation.   Shela Leff, MD   01/06/2017, 8:00 AM

## 2017-01-15 MED FILL — AMLODIPINE BESYLATE 2.5 MG: 2.5 | 30 days supply | Qty: 30 | Fill #4

## 2017-01-15 MED FILL — LISINOPRIL 20 MG TAB: 20 | 30 days supply | Qty: 30 | Fill #7

## 2017-01-15 MED FILL — OMEPRAZOLE DR 20 MG CAPSULE: 20 | 30 days supply | Qty: 30 | Fill #8

## 2017-01-18 ENCOUNTER — Ambulatory Visit: Payer: BLUE CROSS/BLUE SHIELD | Admitting: Podiatry

## 2017-02-01 ENCOUNTER — Encounter: Payer: BLUE CROSS/BLUE SHIELD | Admitting: Podiatry

## 2017-02-05 ENCOUNTER — Ambulatory Visit: Payer: BLUE CROSS/BLUE SHIELD | Admitting: *Deleted

## 2017-02-05 DIAGNOSIS — Z0000 Encounter for general adult medical examination without abnormal findings: Secondary | ICD-10-CM

## 2017-02-09 ENCOUNTER — Encounter: Payer: Self-pay | Admitting: Podiatry

## 2017-02-09 ENCOUNTER — Ambulatory Visit (INDEPENDENT_AMBULATORY_CARE_PROVIDER_SITE_OTHER): Payer: BLUE CROSS/BLUE SHIELD | Admitting: Podiatry

## 2017-02-09 ENCOUNTER — Ambulatory Visit (INDEPENDENT_AMBULATORY_CARE_PROVIDER_SITE_OTHER): Payer: BLUE CROSS/BLUE SHIELD

## 2017-02-09 ENCOUNTER — Other Ambulatory Visit: Payer: Self-pay | Admitting: Podiatry

## 2017-02-09 VITALS — BP 131/86 | HR 84 | Resp 16 | Ht 59.0 in | Wt 148.0 lb

## 2017-02-09 DIAGNOSIS — M722 Plantar fascial fibromatosis: Secondary | ICD-10-CM

## 2017-02-09 DIAGNOSIS — M79671 Pain in right foot: Secondary | ICD-10-CM

## 2017-02-09 DIAGNOSIS — M79672 Pain in left foot: Secondary | ICD-10-CM

## 2017-02-09 LAB — TB SKIN TEST
INDURATION: 0 mm
TB SKIN TEST: NEGATIVE

## 2017-02-09 MED ORDER — DICLOFENAC SODIUM 75 MG PO TBEC: 75.0000 mg | DELAYED_RELEASE_TABLET | Freq: Two times a day (BID) | ORAL | 2 refills | Status: DC

## 2017-02-09 MED ORDER — TRIAMCINOLONE ACETONIDE 10 MG/ML IJ SUSP
10.0000 mg | Freq: Once | INTRAMUSCULAR | Status: AC
  Administered 2017-02-09: 10 mg

## 2017-02-09 MED FILL — DICLOFENAC SOD 75 MG TAB EC: 75 | 25 days supply | Qty: 50 | Fill #0

## 2017-02-09 NOTE — Progress Notes (Signed)
   Subjective:    Patient ID: [name], female    DOB: [birth year], 56 y.o.   MRN: [id number]  HPI Chief Complaint  Patient presents with  . Foot Pain    Bilateral; bottom of heel; pt stated, "Has been hurting for the past 4-5 years; has had cortisone injections in the past from Sports Medicine - last injection was on July 2018; Left foot hurts more than the right foot"      Review of Systems  Constitutional: Positive for activity change.  HENT: Positive for sinus pain.   All other systems reviewed and are negative.      Objective:   Physical Exam        Assessment & Plan:

## 2017-02-12 NOTE — Progress Notes (Signed)
Subjective:    Patient ID: [name], female   DOB: 56 y.o.   MRN: [id number]   HPI patient states she's had off and on problems with her heel for a long time and it's worsened recently and is worse when she tries to get up or sit and states the left is much worse than the right at this time    Review of Systems  All other systems reviewed and are negative.       Objective:  Physical Exam  Cardiovascular: Intact distal pulses.   Musculoskeletal: Normal range of motion.  Neurological: She is alert.  Skin: Skin is warm.  Nursing note and vitals reviewed.  neurovascular status found to be intact muscle strength adequate range of motion within normal limits with patient found to have exquisite discomfort in the left plantar fashion at the insertional point of the tendon into the calcaneus with moderate discomfort in the right. There is depression of the arch noted and patient's had a history of cortisone injections which at this time is not been successful in curing this problem     Assessment:   Appears to be some form of acute plan her fasciitis left with inflammation fluid around the medial band      Plan:   H&P condition reviewed and today I injected the fascia 3 mg Kenalog 5 mg Xylocaine and I went ahead and I applied fascial brace for both feet. She would do well and orthotics we'll make that decision when she returns and it may end up requiring other treatment due to long-term nature and failure to respond other conservative treatments  X-rays indicate that there is spur with no indication of stress fracture or advanced arthritis

## 2017-02-15 ENCOUNTER — Ambulatory Visit: Payer: BLUE CROSS/BLUE SHIELD | Admitting: Podiatry

## 2017-02-20 ENCOUNTER — Other Ambulatory Visit: Payer: Self-pay | Admitting: Internal Medicine

## 2017-02-20 MED FILL — LISINOPRIL 20 MG TABS: 20 | 30 days supply | Qty: 30 | Fill #8

## 2017-02-20 MED FILL — AMLODIPINE BESYLATE 2.5 MG: 2.5 | 30 days supply | Qty: 30 | Fill #5

## 2017-02-20 MED FILL — OMEPRAZOLE 20 MG CAP: 20 | 90 days supply | Qty: 90 | Fill #0

## 2017-02-23 ENCOUNTER — Ambulatory Visit: Payer: BLUE CROSS/BLUE SHIELD | Admitting: Podiatry

## 2017-02-28 NOTE — Progress Notes (Signed)
This encounter was created in error - please disregard.

## 2017-03-15 MED FILL — LISINOPRIL 20 MG TABS: 20 | 30 days supply | Qty: 30 | Fill #9

## 2017-03-17 ENCOUNTER — Encounter (HOSPITAL_COMMUNITY): Payer: Self-pay | Admitting: Emergency Medicine

## 2017-03-17 ENCOUNTER — Emergency Department (HOSPITAL_COMMUNITY)
Admission: EM | Admit: 2017-03-17 | Discharge: 2017-03-17 | Disposition: A | Discharge status: Home / Self Care | Payer: BLUE CROSS/BLUE SHIELD | Attending: Emergency Medicine | Admitting: Emergency Medicine

## 2017-03-17 DIAGNOSIS — J399 Disease of upper respiratory tract, unspecified: Secondary | ICD-10-CM | POA: Diagnosis not present

## 2017-03-17 DIAGNOSIS — R509 Fever, unspecified: Secondary | ICD-10-CM | POA: Diagnosis present

## 2017-03-17 DIAGNOSIS — F17210 Nicotine dependence, cigarettes, uncomplicated: Secondary | ICD-10-CM | POA: Insufficient documentation

## 2017-03-17 DIAGNOSIS — J069 Acute upper respiratory infection, unspecified: Secondary | ICD-10-CM

## 2017-03-17 DIAGNOSIS — I10 Essential (primary) hypertension: Secondary | ICD-10-CM | POA: Diagnosis not present

## 2017-03-17 DIAGNOSIS — Z79899 Other long term (current) drug therapy: Secondary | ICD-10-CM | POA: Diagnosis not present

## 2017-03-17 MED ORDER — FLUTICASONE PROPIONATE 50 MCG/ACT NA SUSP: 1.0000 | Freq: Every day | NASAL | 0 refills | Status: DC

## 2017-03-17 MED ORDER — ACETAMINOPHEN 500 MG PO TABS
1000.0000 mg | ORAL_TABLET | Freq: Once | ORAL | Status: AC
  Administered 2017-03-17: 1000 mg via ORAL
  Filled 2017-03-17: qty 2

## 2017-03-17 MED ORDER — BENZONATATE 100 MG PO CAPS: 100.0000 mg | ORAL_CAPSULE | Freq: Three times a day (TID) | ORAL | 0 refills | Status: DC

## 2017-03-17 MED ORDER — ACETAMINOPHEN 325 MG PO TABS: 650.0000 mg | ORAL_TABLET | Freq: Four times a day (QID) | ORAL | 0 refills | Status: DC | PRN

## 2017-03-17 NOTE — ED Triage Notes (Signed)
Pt started having a headache on Thursday, sinus drainage, body aches, boyfriend has sore throat and fever at home, pt got flu shot on 03/05/17.

## 2017-03-17 NOTE — Discharge Instructions (Signed)
Use tylenol or ibuprofen as needed for fever, chills, or aches.  Use tessalon perles as needed for cough.  Use flonase daily for nasal congestion.  Stay well hydrated with water. You will likely have continued symptoms for several days. Follow up with your primary care doctor in 7-10 days if your symptoms are not improving.  Return to the ER if you have difficulty breathing, chest pain, worsening body pains, or any new or worsening symptoms.

## 2017-03-17 NOTE — ED Provider Notes (Signed)
[facility] DEPT Provider Note   CSN: [id number] Arrival date & time: 03/17/17  0701     History   Chief Complaint Chief Complaint  Patient presents with  . body aches  . Diarrhea  . Emesis    HPI [name] is a 56 y.o. female presenting with fever, cough, and nasal congestion.   Pt states that she started to have nasal congestion and cough yesterday, with development of the fever today. She reports her cough occasionally produces phlegm. She has not taken anything for her symptoms. She denies headache, sore throat, SOB, CP, n/v, abd pain, urinary sxs or abnl BMs. She denies leg pain or swelling. She reports her boyfriend hs a sore throat a fever x3 days. She is not immunocompromised. She has PCP follow up.   HPI  Past Medical History:  Diagnosis Date  . Anemia   . Arthritis   . GERD (gastroesophageal reflux disease)   . Hiatal hernia   . Hyperlipidemia   . Hypertension   . Plantar fasciitis     Patient Active Problem List   Diagnosis Date Noted  . 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 Acute right-sided thoracic back pain 12/08/2016  . Right foot pain 11/23/2016  . Radicular pain of right lower extremity 07/07/2016  . H/O TIA (transient ischemic attack) and stroke 06/12/2014  . Cervical radiculitis 02/12/2014  . Healthcare maintenance 04/30/2013  . Shoulder pain, left 12/01/2011  . Essential hypertension, benign 01/24/2010  . Anemia 01/11/2010  . Hyperlipemia 05/28/2007  . TOBACCO ABUSE 05/28/2007  . GERD 05/28/2007    Past Surgical History:  Procedure Laterality Date  . ABDOMINAL HYSTERECTOMY  12/09   Secondary to fibroids  . CESAREAN SECTION      OB History    No data available       Home Medications    Prior to Admission medications   Medication Sig Start Date End Date Taking? Authorizing Provider  acetaminophen (TYLENOL) 325 MG tablet Take 2 tablets (650 mg total) by mouth every 6 (six) hours as needed for mild pain,  moderate pain or fever. 03/17/17   Sanna Porcaro, PA-C  amLODipine (NORVASC) 2.5 MG tablet TAKE 1 TABLET BY MOUTH DAILY 09/13/16   Bartholomew Crews, MD  benzonatate (TESSALON) 100 MG capsule Take 1 capsule (100 mg total) by mouth every 8 (eight) hours. 03/17/17   Mirella Gueye, PA-C  cyclobenzaprine (FLEXERIL) 5 MG tablet Take 1 tablet (5 mg total) by mouth 3 (three) times daily as needed for muscle spasms. 12/08/16   Lorella Nimrod, MD  diclofenac (VOLTAREN) 75 MG EC tablet Take 1 tablet (75 mg total) by mouth 2 (two) times daily. 02/09/17   Wallene Huh, DPM  ferrous sulfate (IRON SUPPLEMENT) 325 (65 FE) MG tablet Take 1 tablet (325 mg total) by mouth daily with breakfast. 03/01/16   Bartholomew Crews, MD  fluticasone [facility]) 50 MCG/ACT nasal spray Place 1 spray into both nostrils daily. 03/17/17   Baird Polinski, PA-C  ibuprofen (ADVIL,MOTRIN) 600 MG tablet Take 1 tablet (600 mg total) by mouth every 6 (six) hours as needed. 11/23/16   Dellia Nims, MD  lisinopril (PRINIVIL,ZESTRIL) 20 MG tablet Take 1 tablet (20 mg total) by mouth daily. 06/05/16   Bartholomew Crews, MD  omeprazole (PRILOSEC) 20 MG capsule TAKE 1 CAPSULE BY MOUTH ONCE DAILY 02/20/17   Bartholomew Crews, MD  traMADol (ULTRAM) 50 MG tablet Take 1 tablet (50 mg total) by mouth every 6 (six)  hours as needed. 11/30/16   Bartholomew Crews, MD    Family History Family History  Problem Relation Age of Onset  . Cancer Mother 73       pancreatic cancer  . Pancreatic cancer Mother   . Cancer Father 3       throat cancer  . Cancer Maternal Aunt 60       breast cancer  . Cancer Other        Died  from Colon cancer in 77's  . Colon cancer Maternal Uncle   . Colon cancer Paternal Uncle     Social History Social History  Substance Use Topics  . Smoking status: Current Every Day Smoker    Packs/day: 0.10    Types: Cigarettes  . Smokeless tobacco: Never Used     Comment: Down to 1-2 cig/day.  . Alcohol use  Yes     Comment: occasionally     Allergies   Aspirin and Hydromorphone hcl   Review of Systems Review of Systems  Constitutional: Positive for fever (subjective).  HENT: Positive for congestion, rhinorrhea and sinus pressure. Negative for sore throat and trouble swallowing.   Eyes: Negative for pain and discharge.  Respiratory: Positive for cough. Negative for chest tightness and shortness of breath.   Cardiovascular: Negative for chest pain, palpitations and leg swelling.  Gastrointestinal: Negative for abdominal pain, constipation, diarrhea, nausea and vomiting.     Physical Exam Updated Vital Signs BP 135/89   Pulse 82   Temp 98.2 F (36.8 C) (Oral)   Resp 16   SpO2 98%   Physical Exam  Constitutional: She is oriented to person, place, and time. She appears well-developed and well-nourished. No distress.  HENT:  Head: Normocephalic and atraumatic.  Right Ear: Tympanic membrane, external ear and ear canal normal.  Left Ear: Tympanic membrane, external ear and ear canal normal.  Nose: Mucosal edema and rhinorrhea present. Right sinus exhibits no maxillary sinus tenderness and no frontal sinus tenderness. Left sinus exhibits no maxillary sinus tenderness and no frontal sinus tenderness.  Mouth/Throat: Uvula is midline, oropharynx is clear and moist and mucous membranes are normal.  Eyes: Pupils are equal, round, and reactive to light. Conjunctivae and EOM are normal.  Neck: Normal range of motion.  Cardiovascular: Normal rate, regular rhythm and intact distal pulses.   Pulmonary/Chest: Effort normal and breath sounds normal. No respiratory distress. She has no decreased breath sounds. She has no wheezes. She has no rhonchi. She has no rales.  Pt talking in full sentences without difficulty. Clear lung sounds in all fields.   Abdominal: Soft. Bowel sounds are normal. She exhibits no distension and no mass. There is no tenderness. There is no guarding.  Musculoskeletal:  Normal range of motion. She exhibits no edema or tenderness.  Lymphadenopathy:    She has cervical adenopathy.  Neurological: She is alert and oriented to person, place, and time.  Skin: Skin is warm and dry.  Psychiatric: She has a normal mood and affect.  Nursing note and vitals reviewed.    ED Treatments / Results  Labs (all labs ordered are listed, but only abnormal results are displayed) Labs Reviewed - No data to display  EKG  EKG Interpretation None       Radiology No results found.  Procedures Procedures (including critical care time)  Medications Ordered in ED Medications  acetaminophen (TYLENOL) tablet 1,000 mg (1,000 mg Oral Given 03/17/17 0846)     Initial Impression / Assessment and Plan /  ED Course  I have reviewed the triage vital signs and the nursing notes.  Pertinent labs & imaging results that were available during my care of the patient were reviewed by me and considered in my medical decision making (see chart for details).     Patient presenting with 1 day of cough, nasal congestion, and subjective fever. Vital signs show patient is afebrile, not tachycardic, not hypoxic. Physical exam reassuring, as lung exam is benign. No obvious bacterial infection at this time, doubt pneumonia. As symptoms have been going on for 1 day, likely viral. Discussed course of viral illness. Discussed symptomatically treatment. Patient to follow-up with PCP if symptoms are not improving in 7-10 days. Patient to return if symptoms become worse. At this time, patient appears safe for discharge. Return precautions given. Patient states she understands and agrees to plan.   Final Clinical Impressions(s) / ED Diagnoses   Final diagnoses:  Upper respiratory tract infection, unspecified type    New Prescriptions Discharge Medication List as of 03/17/2017  8:50 AM    START taking these medications   Details  acetaminophen (TYLENOL) 325 MG tablet Take 2 tablets (650 mg  total) by mouth every 6 (six) hours as needed for mild pain, moderate pain or fever., Starting Sat 03/17/2017, Print    benzonatate (TESSALON) 100 MG capsule Take 1 capsule (100 mg total) by mouth every 8 (eight) hours., Starting Sat 03/17/2017, Print    fluticasone (FLONASE) 50 MCG/ACT nasal spray Place 1 spray into both nostrils daily., Starting Sat 03/17/2017, Print         [place], [place], PA-C 03/17/17 1155    Pattricia Boss, MD 03/19/17 1115

## 2017-03-19 ENCOUNTER — Other Ambulatory Visit: Payer: Self-pay | Admitting: Internal Medicine

## 2017-03-19 MED FILL — AMLODIPINE BESYLATE 2.5 MG: 2.5 | 30 days supply | Qty: 90 | Fill #0

## 2017-03-19 NOTE — Telephone Encounter (Signed)
pls sch CC [place] [facility] - May. HTN F/U Thanks

## 2017-03-20 ENCOUNTER — Ambulatory Visit: Payer: BLUE CROSS/BLUE SHIELD

## 2017-03-21 ENCOUNTER — Telehealth: Payer: Self-pay | Admitting: *Deleted

## 2017-03-21 ENCOUNTER — Ambulatory Visit (INDEPENDENT_AMBULATORY_CARE_PROVIDER_SITE_OTHER): Payer: BLUE CROSS/BLUE SHIELD | Admitting: Internal Medicine

## 2017-03-21 ENCOUNTER — Encounter (INDEPENDENT_AMBULATORY_CARE_PROVIDER_SITE_OTHER): Payer: Self-pay

## 2017-03-21 VITALS — BP 147/92 | HR 81 | Temp 98.4°F | Ht 59.0 in | Wt 144.2 lb

## 2017-03-21 DIAGNOSIS — J069 Acute upper respiratory infection, unspecified: Secondary | ICD-10-CM | POA: Diagnosis not present

## 2017-03-21 NOTE — Patient Instructions (Addendum)
[name],   It was a pleasure to meet you today.   I believe you have a vial upper respiratory infection, which may take a week or two to completely go away.  Continue taking the Tessalon pearls and the Flonase. I have provided you instructions on how to use the nasal spray to get the best outcome.   NASAL STEROID USE INSTRUCTIONS  Step 1. Prepare the nose. Blow the nose before administering the drug.  Step 2. Prime and activate the delivery device as recommended by the manufacturer.  Step 3. Position the head by tilting the head forward.  Step 4. Insert the tip of the applicator gently, avoiding contact with the septum.  Step 5. Aim the applicator tip about 45 from the floor of the nose and direct it at the outer corner of the eye on the same side to avoid traumatizing or spraying the septum.  Step 6. Close the other nostril gently with a finger.   Step 7. Sniff or inhale gently while delivering the drug.  BUFFERED ISOTONIC SALINE NASAL IRRIGATION  The Benefits:  1. When you irrigate, the isotonic saline (salt water) acts as a solvent and washes the mucus crusts and other debris from your nose.  2. This decongests and improves the airflow into your nose. The sinus passages begin to open.  3. Studies have also shown that a salt water and an alkaline (baking soda) irrigation solution improves nasal membrane cell function (mucociliary flow of mucus debris).  The Recipe:  1. Choose a 1-quart glass jar that is thoroughly cleansed.  2. Fill with sterile or distilled water, or you can boil water from the tap.  3. Add 1 to 2 heaping teaspoons of "pickling/canning/sea" salt (NOT table salt as it contains a large number of additives). This salt is available at the grocery store in the food canning section.  4. Add 1 teaspoon of [employer] Hammer Baking Soda (pure bicarbonate).  5. Mix ingredients together and store at room temperature. Discard after one week. If you find this  solution too strong, you may decrease the amount of salt added to 1 to 1  teaspoons. With children it is often best to start with a milder solution and advance slowly. Irrigate with 240 ml (8 oz) twice daily.  The Instructions:  You should plan to irrigate your nose with buffered isotonic saline 2 times per day. Many people prefer to warm the solution slightly in the microwave - but be sure that the solution is NOT HOT. Stand over the sink (some do this in the shower) and squirt the solution into each side of your nose, keeping your mouth open. This allows you to spit the saltwater out of your mouth. It will not harm you if you swallow a little.  If you have been told to use a nasal steroid such as Flonase, Nasonex, or Nasacort, you should always use isortonic saline solution first, then use your nasal steroid product. The nasal steroid is much more effective when sprayed onto clean nasal membranes and the steroid medicine will reach deeper into the nose.  Most people experience a little burning sensation the first few times they use a isotonic saline solution, but this usually goes away within a few days.

## 2017-03-21 NOTE — Assessment & Plan Note (Signed)
Patient's symptoms of nasal congestion, throat irritation, and cough, as well as a positive history for sick contact with similar symptoms are most consistent with viral upper respiratory infection. She is afebrile, sating 100% on RA, lungs are clear bilaterally to auscultation, and she has no underlying respiratory disease.   Plan: -Continue Flonase and Tessalon Pearls -Instructed patient on proper use of nasal spray -Supportive measures: hot showers and saline nasal spray were recommended

## 2017-03-21 NOTE — Telephone Encounter (Signed)
Pt presents to clinic stating she spoke w/ an "on-call doctor" this am and the doctor told her she would give triage a message, I do not see a message but may have not gotten it yet. Pt states she went to ED and was told she has a virus, she denies fevers just achiness, h/a, congestion, was given some meds in ED- tylenol, tessalon and flonase. States she coughs at times til she spits up. She states she has not felt like going in to work and desires a note. Informed her as long as no fevers she may return to work, she states she needs a note to cover through today, can you write her one?

## 2017-03-21 NOTE — Progress Notes (Signed)
   CC: cough, sinus pressure, nasal congestion  HPI:  [name] is a 56 y.o. female with past medical history as documented below presenting with 4 days of cough, sinus pressure, and nasal congestion. She also is having watery eyes. She was seen in the ED on 9/22 for similar symptoms and was discharged on Flonase and Tessalon pearls. She says her symptoms are improving somewhat. She states the Tessalon pearls have helped her cough, but she continues to have nasal congestion and sinus pressure particularly in her forehead. She was not instructed on how to apply the nasal spray properly.Her cough is mildly productive of white phlegm. She has no nausea, fevers, diarrhea, shortness of breath or chest pain. She does endorse intermittent chills. Her boyfriend was sick with similar symptoms. She denies recent travel.   Past Medical History:  Diagnosis Date  . Anemia   . Arthritis   . GERD (gastroesophageal reflux disease)   . Hiatal hernia   . Hyperlipidemia   . Hypertension   . Plantar fasciitis    Review of Systems:   Review of Systems  Constitutional: Positive for chills and malaise/fatigue. Negative for fever.  HENT: Positive for congestion, sinus pain and sore throat. Negative for ear pain.   Eyes:       Watery eyes   Respiratory: Positive for cough and sputum production. Negative for hemoptysis, shortness of breath and wheezing.   Cardiovascular: Negative for chest pain.  Gastrointestinal: Negative for diarrhea, nausea and vomiting.  Musculoskeletal: Negative for myalgias.    Physical Exam:  Vitals:   03/21/17 1415  BP: (!) 147/92  Pulse: 81  Temp: 98.4 F (36.9 C)  TempSrc: Oral  SpO2: 100%  Weight: 144 lb 3.2 oz (65.4 kg)  Height: 4\' 11"  (1.499 m)   General: Sitting comfortably, NAD HEENT: Early/AT, EOMI, no scleral icterus, no cervical adenopathy, posterior oropharynx non-erythematous, edematous nasal turbinates bilaterally, no purulent drainage Cardiac: RRR, No  R/M/G appreciated Pulm: normal effort, no respiratory distress, CTAB, good aeration through all lung fields, no wheezing/rales/rhonci Abd: soft, non tender, non distended, BS normal Ext: extremities well perfused, no peripheral edema Neuro: alert and oriented X3, cranial nerves II-XII grossly intact   Assessment & Plan:   See Encounters Tab for problem based charting.  Patient seen with Dr. Dareen Piano

## 2017-03-22 ENCOUNTER — Encounter: Payer: Self-pay | Admitting: Internal Medicine

## 2017-03-26 ENCOUNTER — Telehealth: Payer: Self-pay | Admitting: *Deleted

## 2017-03-26 HISTORY — PX: FOOT SURGERY: SHX648

## 2017-03-26 NOTE — Telephone Encounter (Signed)
Called pt - no answer; left message letter to return to work will be at the front desk.

## 2017-03-27 NOTE — Progress Notes (Signed)
Internal Medicine Clinic Attending  I saw and evaluated the patient.  I personally confirmed the key portions of the history and exam documented by Dr. LaCroce and I reviewed pertinent patient test results.  The assessment, diagnosis, and plan were formulated together and I agree with the documentation in the resident's note.  

## 2017-04-10 ENCOUNTER — Ambulatory Visit (INDEPENDENT_AMBULATORY_CARE_PROVIDER_SITE_OTHER): Payer: BLUE CROSS/BLUE SHIELD | Admitting: Internal Medicine

## 2017-04-10 ENCOUNTER — Encounter: Payer: Self-pay | Admitting: Internal Medicine

## 2017-04-10 ENCOUNTER — Telehealth: Payer: Self-pay

## 2017-04-10 VITALS — BP 123/80 | HR 72 | Temp 98.2°F | Ht 59.0 in | Wt 145.5 lb

## 2017-04-10 DIAGNOSIS — R0981 Nasal congestion: Secondary | ICD-10-CM | POA: Diagnosis not present

## 2017-04-10 DIAGNOSIS — Z79899 Other long term (current) drug therapy: Secondary | ICD-10-CM | POA: Diagnosis not present

## 2017-04-10 DIAGNOSIS — R0982 Postnasal drip: Secondary | ICD-10-CM

## 2017-04-10 DIAGNOSIS — R51 Headache: Secondary | ICD-10-CM | POA: Diagnosis not present

## 2017-04-10 DIAGNOSIS — J3489 Other specified disorders of nose and nasal sinuses: Secondary | ICD-10-CM | POA: Diagnosis not present

## 2017-04-10 DIAGNOSIS — F17210 Nicotine dependence, cigarettes, uncomplicated: Secondary | ICD-10-CM

## 2017-04-10 DIAGNOSIS — I10 Essential (primary) hypertension: Secondary | ICD-10-CM | POA: Diagnosis not present

## 2017-04-10 DIAGNOSIS — R05 Cough: Secondary | ICD-10-CM | POA: Diagnosis not present

## 2017-04-10 DIAGNOSIS — J0110 Acute frontal sinusitis, unspecified: Secondary | ICD-10-CM

## 2017-04-10 MED ORDER — CHLORPHENIRAMINE MALEATE 4 MG PO TABS: 4.0000 mg | ORAL_TABLET | Freq: Three times a day (TID) | ORAL | 1 refills | Status: DC | PRN

## 2017-04-10 NOTE — Assessment & Plan Note (Signed)
Patient is here today with complaint of sinus headaches & congestion. She was seen one month ago for symptoms of URI and treated conservatively with flonase and tessalon pearls. Patient reports initial improvement with the flonase, but stopped using it after she felt better She is now experiencing worsening congestion, headaches, and post nasal drainage. She has tenderness to palpation over her frontal and maxillary sinuses. She denies fever and any known sick contacts, but works in a rehab facility with elderly patients. Counseled patient that symptoms are likely viral or allergic in nature. Advised her to continue daily flonase. Will also do a trial of chlorpheniramine to help clear her congestion.  -- Flonase 2 puffs each nostril daily  -- Chlorpheniramine 4 mg q8hr prn, advised caution with sedation side effect

## 2017-04-10 NOTE — Assessment & Plan Note (Signed)
Well controlled today, 123/80. Continue current regimen.  -- Amlodipine 2.5 mg daily  -- Lisinopril 20 mg daily

## 2017-04-10 NOTE — Patient Instructions (Signed)
Ms. Gauger,  It was a pleasure to see you today. For your sinus congestion, you may take chlorpheniramine every 8 hours as needed. This medicine is sedating, please do not drive after taking this medicine. Please continue to use flonase, 2 puffs each nostril daily for the next 2 weeks or until your symptoms resolve. If you have any questions or concerns, call our clinic at [phone] or after hours call [phone] and ask for the internal medicine resident on call. Thank you!  - Dr. Philipp Ovens

## 2017-04-10 NOTE — Telephone Encounter (Signed)
Needs to speak with a nurse about having a headache. Please call pt back.

## 2017-04-10 NOTE — Telephone Encounter (Signed)
resolved 

## 2017-04-10 NOTE — Progress Notes (Signed)
   CC: Headaches  HPI:  [name] is a 56 y.o. female with past medical history outlined below here with complaint of headaches. For the details of today's visit, please refer to the assessment and plan.  Past Medical History:  Diagnosis Date  . Anemia   . Arthritis   . GERD (gastroesophageal reflux disease)   . Hiatal hernia   . Hyperlipidemia   . Hypertension   . Plantar fasciitis     Review of Systems  Constitutional: Negative for fever.  HENT: Positive for congestion and sinus pain.   Respiratory: Positive for cough.   Neurological: Positive for headaches. Negative for focal weakness.    Physical Exam:  Vitals:   04/10/17 1047  BP: 123/80  Pulse: 72  Temp: 98.2 F (36.8 C)  TempSrc: Oral  SpO2: 100%  Weight: 145 lb 8 oz (66 kg)  Height: 4\' 11"  (1.499 m)    Constitutional: NAD, appears comfortable HEENT: Tenderness to palpation over frontal and maxillary sinuses  Cardiovascular: RRR, no murmurs, rubs, or gallops.  Pulmonary/Chest: CTAB, no wheezes, rales, or rhonchi. Extremities: Warm and well perfused. Distal pulses intact.  Psychiatric: Normal mood and affect  Assessment & Plan:   See Encounters Tab for problem based charting.  Patient discussed with Dr. Lynnae January

## 2017-04-11 NOTE — Progress Notes (Signed)
Internal Medicine Clinic Attending  Case discussed with Dr. Guilloud at the time of the visit.  We reviewed the resident's history and exam and pertinent patient test results.  I agree with the assessment, diagnosis, and plan of care documented in the resident's note.  

## 2017-04-16 ENCOUNTER — Encounter (HOSPITAL_COMMUNITY): Payer: Self-pay

## 2017-04-16 ENCOUNTER — Emergency Department (HOSPITAL_COMMUNITY)
Admission: EM | Admit: 2017-04-16 | Discharge: 2017-04-16 | Disposition: A | Discharge status: Home / Self Care | Payer: BLUE CROSS/BLUE SHIELD | Attending: Emergency Medicine | Admitting: Emergency Medicine

## 2017-04-16 DIAGNOSIS — M79672 Pain in left foot: Secondary | ICD-10-CM | POA: Insufficient documentation

## 2017-04-16 DIAGNOSIS — Z79899 Other long term (current) drug therapy: Secondary | ICD-10-CM | POA: Diagnosis not present

## 2017-04-16 DIAGNOSIS — F17210 Nicotine dependence, cigarettes, uncomplicated: Secondary | ICD-10-CM | POA: Insufficient documentation

## 2017-04-16 DIAGNOSIS — I10 Essential (primary) hypertension: Secondary | ICD-10-CM | POA: Diagnosis not present

## 2017-04-16 NOTE — Discharge Instructions (Signed)
It was my pleasure taking care of you today!   I have made you an appointment with Dr. Jacqualyn Posey at [facility] Friday morning at the [facility] in [place]. Please keep this scheduled appointment or call in advance to change if you cannot make it. The address is [address]   Alternate between Tylenol and ibuprofen as needed for pain. Rest, ice and elevate foot as much as possible throughout the day.   Return to ER for new or worsening symptoms, any additional concerns.

## 2017-04-16 NOTE — ED Triage Notes (Signed)
Pt states hx of planter fascitis. She reports she works on her feet in [place] and has had severe pain X1 week. Pt walks with a limp due to pain.

## 2017-04-16 NOTE — ED Provider Notes (Signed)
[facility] EMERGENCY DEPARTMENT Provider Note   CSN: [id number] Arrival date & time: 04/16/17  1004     History   Chief Complaint Chief Complaint  Patient presents with  . Foot Pain    HPI [name] is a 56 y.o. female.  The history is provided by the patient and medical records. No language interpreter was used.   [name] is a 56 y.o. female  with a PMH of plantar fascitis who presents to the Emergency Department complaining of progressively worsening, constant left plantar foot pain x 1 week. Patient states this feels similar to symptoms she experienced when diagnosed with plantar fascitis in the right foot. She has tried taking 400mg  ibuprofen 2-3 times a day with little improvement. 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 She notes that pain is typically the worst first thing in the morning, then worsens again after being on her feet for several hours. She has seen a podiatrist in the past, most recently in August 2018 where she received an injection in the right foot which has really helped. They also applied fascial braces to BOTH feet which she wore yesterday, but has not been wearing very consistently.   Past Medical History:  Diagnosis Date  . Anemia   . Arthritis   . GERD (gastroesophageal reflux disease)   . Hiatal hernia   . Hyperlipidemia   . Hypertension   . Plantar fasciitis     Patient Active Problem List   Diagnosis Date Noted  . Sinusitis 03/21/2017  . 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 Acute right-sided thoracic back pain 12/08/2016  . Right foot pain 11/23/2016  . Radicular pain of right lower extremity 07/07/2016  . H/O TIA (transient ischemic attack) and stroke 06/12/2014  . Cervical radiculitis 02/12/2014  . Healthcare maintenance 04/30/2013  . Shoulder pain, left 12/01/2011  . Essential hypertension,  benign 01/24/2010  . Anemia 01/11/2010  . Hyperlipemia 05/28/2007  . TOBACCO ABUSE 05/28/2007  . GERD 05/28/2007    Past Surgical History:  Procedure Laterality Date  . ABDOMINAL HYSTERECTOMY  12/09   Secondary to fibroids  . CESAREAN SECTION      OB History    No data available       Home Medications    Prior to Admission medications   Medication Sig Start Date End Date Taking? Authorizing Provider  acetaminophen (TYLENOL) 325 MG tablet Take 2 tablets (650 mg total) by mouth every 6 (six) hours as needed for mild pain, moderate pain or fever. 03/17/17  Yes Caccavale, Sophia, PA-C  amLODipine (NORVASC) 2.5 MG tablet TAKE 1 TABLET BY MOUTH DAILY 03/19/17  Yes Bartholomew Crews, MD  cyclobenzaprine (FLEXERIL) 5 MG tablet Take 1 tablet (5 mg total) by mouth 3 (three) times daily as needed for muscle spasms. 12/08/16  Yes Lorella Nimrod, MD  fluticasone (FLONASE) 50 MCG/ACT nasal spray Place 1 spray into both nostrils daily. 03/17/17  Yes Caccavale, Sophia, PA-C  ibuprofen (ADVIL,MOTRIN) 600 MG tablet Take 1 tablet (600 mg total) by mouth every 6 (six) hours as needed. 11/23/16  Yes Ahmed, Chesley Mires, MD  lisinopril (PRINIVIL,ZESTRIL) 20 MG tablet Take 1 tablet (20 mg total) by mouth daily. 06/05/16  Yes Bartholomew Crews, MD  omeprazole (PRILOSEC) 20 MG capsule TAKE 1 CAPSULE BY MOUTH ONCE DAILY 02/20/17  Yes Bartholomew Crews, MD  traMADol (ULTRAM) 50 MG tablet Take 1 tablet (50 mg total) by mouth every 6 (six) hours as needed. 11/30/16  Yes Bartholomew Crews, MD  benzonatate (TESSALON) 100 MG capsule Take 1 capsule (100 mg total) by mouth every 8 (eight) hours. Patient not taking: Reported on 04/16/2017 03/17/17   Caccavale, Sophia, PA-C  chlorpheniramine (CHLOR-TRIMETON) 4 MG tablet Take 1 tablet (4 mg total) by mouth every 8 (eight) hours as needed for allergies (Congestion). Patient not taking: Reported on 04/16/2017 04/10/17   Velna Ochs, MD  diclofenac (VOLTAREN) 75 MG EC  tablet Take 1 tablet (75 mg total) by mouth 2 (two) times daily. Patient not taking: Reported on 04/16/2017 02/09/17   Wallene Huh, DPM  ferrous sulfate (IRON SUPPLEMENT) 325 (65 FE) MG tablet Take 1 tablet (325 mg total) by mouth daily with breakfast. Patient not taking: Reported on 04/16/2017 03/01/16   Bartholomew Crews, MD    Family History Family History  Problem Relation Age of Onset  . Cancer Mother 54       pancreatic cancer  . Pancreatic cancer Mother   . Cancer Father 68       throat cancer  . Cancer Maternal Aunt 60       breast cancer  . Cancer Other        Died  from Colon cancer in 37's  . Colon cancer Maternal Uncle   . Colon cancer Paternal Uncle     Social History Social History  Substance Use Topics  . Smoking status: Current Every Day Smoker    Packs/day: 0.10    Types: Cigarettes  . Smokeless tobacco: Never Used     Comment: Down to 1-2 cig/day.  . Alcohol use Yes     Comment: occasionally     Allergies   Aspirin and Hydromorphone hcl   Review of Systems Review of Systems  Constitutional: Negative for chills and fever.  HENT: Negative for congestion.   Eyes: Negative for visual disturbance.  Respiratory: Negative for shortness of breath.   Cardiovascular: Negative for chest pain.  Gastrointestinal: Negative for abdominal pain.  Musculoskeletal: Positive for myalgias.  Skin: Negative for color change.  Allergic/Immunologic: Negative for immunocompromised state.  Neurological: Negative for weakness and numbness.     Physical Exam Updated Vital Signs BP (!) 166/107 (BP Location: Right Arm)   Pulse 75   Temp 98.3 F (36.8 C) (Oral)   Resp 18   SpO2 98%   Physical Exam  Constitutional: She appears well-developed and well-nourished. No distress.  HENT:  Head: Normocephalic and atraumatic.  Neck: Neck supple.  Cardiovascular: Normal rate, regular rhythm and normal heart sounds.   No murmur heard. Pulmonary/Chest: Effort normal and  breath sounds normal. No respiratory distress. She has no wheezes. She has no rales.  Musculoskeletal:  Tenderness to palpation along the left medial arch. No tenderness to bilateral malleoli or 5th metatarsal area. Full ROM without pain. Sensation intact. 2+ DP.   Neurological: She is alert.  Skin: Skin is warm and dry.  Nursing note and vitals reviewed.    ED Treatments / Results  Labs (all labs ordered are listed, but only abnormal results are displayed) Labs Reviewed - No data to display  EKG  EKG Interpretation None       Radiology No results found.  Procedures Procedures (including critical care time)  Medications Ordered in ED Medications - No data to display   Initial Impression / Assessment and Plan / ED Course  I have reviewed the triage vital signs and the nursing notes.  Pertinent labs & imaging results that were available during my care of the patient were reviewed by me and considered in my medical decision making (see chart for details).    [name] is a 56 y.o. female who presents to ED for left plantar foot pain x 1 week c/w plantar fascitis. No new trauma / falls. Recently had x-ray and I do not believe repeat imaging would be of any benefit. Exam with no bony tenderness. Placed in cam walker for comfort. I spoke with podiatry and arranged follow up appointment for this Friday at 8:15. Patient is aware of this appointment and grateful for scheduled follow up this week. Reasons to return to ER and symptomatic home care instructions discussed. All questions answered.     Final Clinical Impressions(s) / ED Diagnoses   Final diagnoses:  Left foot pain    New Prescriptions New Prescriptions   No medications on file     Ward, Ozella Almond, PA-C 04/16/17 1141    Sherwood Gambler, MD 04/17/17 [phone]

## 2017-04-18 MED FILL — LISINOPRIL 20 MG TABS: 20 | 30 days supply | Qty: 30 | Fill #10

## 2017-04-20 ENCOUNTER — Encounter: Payer: Self-pay | Admitting: Podiatry

## 2017-04-20 ENCOUNTER — Ambulatory Visit (INDEPENDENT_AMBULATORY_CARE_PROVIDER_SITE_OTHER): Payer: BLUE CROSS/BLUE SHIELD | Admitting: Podiatry

## 2017-04-20 ENCOUNTER — Ambulatory Visit (INDEPENDENT_AMBULATORY_CARE_PROVIDER_SITE_OTHER): Payer: BLUE CROSS/BLUE SHIELD

## 2017-04-20 DIAGNOSIS — M722 Plantar fascial fibromatosis: Secondary | ICD-10-CM | POA: Diagnosis not present

## 2017-04-20 MED ORDER — TRIAMCINOLONE ACETONIDE 10 MG/ML IJ SUSP
10.0000 mg | Freq: Once | INTRAMUSCULAR | Status: AC
  Administered 2017-04-20: 10 mg

## 2017-04-23 NOTE — Progress Notes (Signed)
Subjective:    Patient ID: [name], female   DOB: 56 y.o.   MRN: [id number]   HPI patient presents stating I had severe pain in my left heel and I went to the emergency room and they gave me of boot and it's feeling some but is still very sore    ROS      Objective:  Physical Exam neurovascular status intact with intense discomfort left plantar fascia at the insertional point of the tendon into the calcaneus     Assessment:  Acute plantar fasciitis left       Plan:   Advised on anti-inflammatories ice therapy and I reinjected the plantar fascia 3 mg Kenalog 5 mill grams Xylocaine and ruled out stress fracture. I then went ahead and 52 continued boot usage for a couple weeks and I scanned for custom orthotics to reduce plantar pressure  X-rays were negative for signs of stress fracture at this time appears to be inflammatory

## 2017-04-24 ENCOUNTER — Telehealth: Payer: Self-pay | Admitting: Podiatry

## 2017-04-24 NOTE — Telephone Encounter (Signed)
Left message for pt to call to confirm ok to order orthotics. Per pts insurance she has met her out of pocket of 800.00 and they will be covered at 100%

## 2017-04-26 ENCOUNTER — Telehealth: Payer: Self-pay | Admitting: Internal Medicine

## 2017-04-26 ENCOUNTER — Ambulatory Visit (INDEPENDENT_AMBULATORY_CARE_PROVIDER_SITE_OTHER): Payer: BLUE CROSS/BLUE SHIELD | Admitting: Internal Medicine

## 2017-04-26 ENCOUNTER — Encounter: Payer: Self-pay | Admitting: Internal Medicine

## 2017-04-26 VITALS — BP 133/73 | HR 88 | Temp 98.4°F | Ht 59.0 in | Wt 145.7 lb

## 2017-04-26 DIAGNOSIS — B9789 Other viral agents as the cause of diseases classified elsewhere: Principal | ICD-10-CM

## 2017-04-26 DIAGNOSIS — F17210 Nicotine dependence, cigarettes, uncomplicated: Secondary | ICD-10-CM | POA: Diagnosis not present

## 2017-04-26 DIAGNOSIS — J069 Acute upper respiratory infection, unspecified: Secondary | ICD-10-CM | POA: Diagnosis not present

## 2017-04-26 MED ORDER — SALINE SPRAY 0.65 % NA SOLN: 1.0000 | NASAL | 0 refills | Status: DC | PRN

## 2017-04-26 MED ORDER — FLUTICASONE PROPIONATE 50 MCG/ACT NA SUSP: 1.0000 | Freq: Every day | NASAL | 0 refills | Status: DC

## 2017-04-26 NOTE — Telephone Encounter (Signed)
   Reason for call:   I received a call from Ms. [name] at [facility] indicating that she's had a 1 day history of stuffy nose, post-nasal drip and chest congestion. Also feels like she might be wheezing a bit.   Pertinent Data:   Denies fever, nausea, vomiting, diarrhea, abdominal pain or chest pain though does endorse chest congestion. She is frustrated that the Flonase that she started last night has not helped yet. She was wondering if she needs to go the Emergency Room to be seen.    Assessment / Plan / Recommendations:   Patients symptoms sound most consistent with a viral URI or perhaps allergies. I recommended that she continue her flonase, take Zyrtec and take an OTC cold/flu medication to help with chest congestion. I also suggested that if her symptoms worsen or she feels that she needs to be seen, to make an appointment today in [facility].   As always, pt is advised that if symptoms worsen or new symptoms arise, they should go to an urgent care facility or to to ER for further evaluation.   Vicki Chaffin, DO   04/26/2017, 6:03 AM

## 2017-04-26 NOTE — Patient Instructions (Addendum)
BUFFERED ISOTONIC SALINE NASAL IRRIGATION  The Benefits:  1. When you irrigate, the isotonic saline (salt water) acts as a solvent and washes the mucus crusts and other debris from your nose.  2. This decongests and improves the airflow into your nose. The sinus passages begin to open.  3. Studies have also shown that a salt water and an alkaline (baking soda) irrigation solution improves nasal membrane cell function (mucociliary flow of mucus debris).  The Recipe:  1. Choose a 1-quart glass jar that is thoroughly cleansed.  2. Fill with sterile or distilled water, or you can boil water from the tap.  3. Add 1 to 2 heaping teaspoons of "pickling/canning/sea" salt (NOT table salt as it contains a large number of additives). This salt is available at the grocery store in the food canning section.  4. Add 1 teaspoon of Arm & Hammer Baking Soda (pure bicarbonate).  5. Mix ingredients together and store at room temperature. Discard after one week. If you find this solution too strong, you may decrease the amount of salt added to 1 to 1  teaspoons. With children it is often best to start with a milder solution and advance slowly. Irrigate with 240 ml (8 oz) twice daily.  The Instructions:  You should plan to irrigate your nose with buffered isotonic saline 2 times per day. Many people prefer to warm the solution slightly in the microwave - but be sure that the solution is NOT HOT. Stand over the sink (some do this in the shower) and squirt the solution into each side of your nose, keeping your mouth open. This allows you to spit the saltwater out of your mouth. It will not harm you if you swallow a little.  If you have been told to use a nasal steroid such as Flonase, Nasonex, or Nasacort, you should always use isortonic saline solution first, then use your nasal steroid product. The nasal steroid is much more effective when sprayed onto clean nasal membranes and the steroid medicine will reach  deeper into the nose.  Most people experience a little burning sensation the first few times they use a isotonic saline solution, but this usually goes away within a few days.    

## 2017-04-26 NOTE — Progress Notes (Addendum)
   CC: Cough  HPI:  [name] is a 56 y.o. female with PMHx detailed below presenting with one day of increased cough, headache, and sore throat. She worries this is contagious from a coworker with a 103 degree fever yesterday.  See problem based assessment and plan below for additional details.  Viral URI with cough HPI: She has a new 2 days of cough, sore throat, and headache. She was suffering from mild rhinitis earlier in the month but had improved prior to this episode. She has sick coworkers including one with a fever of 103F at work on Wednesday. She has tried taking high dose vitamin C for symptoms and left over tessalon capsules with not much benefit so far. She denies fever, chills, nausea, diarrhea, or body aches so far. She is also worried about things to watch out for with returning to work at [employer]. A: Viral URI with no features concerning for bacterial infection at this time. P: Recommended starting daily saline irrigation and instructions provided Daily fluticasone Start acetaminophen or ibuprofen for headache and if fever develops She may return to work exercising good hand and cough hygiene as long as afebrile    Past Medical History:  Diagnosis Date  . Anemia   . Arthritis   . GERD (gastroesophageal reflux disease)   . Hiatal hernia   . Hyperlipidemia   . Hypertension   . Plantar fasciitis     Review of Systems: Review of Systems  Constitutional: Positive for malaise/fatigue. Negative for chills and fever.  HENT: Positive for congestion and sore throat. Negative for ear pain, hearing loss, sinus pain and tinnitus.   Eyes: Negative for blurred vision.  Respiratory: Positive for cough and wheezing. Negative for sputum production and shortness of breath.   Gastrointestinal: Negative for diarrhea and heartburn.  Genitourinary: Negative for dysuria.  Musculoskeletal: Negative for myalgias.  Skin: Negative for rash.  Neurological:  Positive for headaches. Negative for sensory change.  Endo/Heme/Allergies: Positive for environmental allergies.  Psychiatric/Behavioral: The patient does not have insomnia.      Physical Exam: Vitals:   04/26/17 1459  BP: 133/73  Pulse: 88  Temp: 98.4 F (36.9 C)  TempSrc: Oral  SpO2: 100%  Weight: 145 lb 11.2 oz (66.1 kg)  Height: 4\' 11"  (1.499 m)   GENERAL- Uncomfortable appearing woman in no acute distress HEENT- No sinus tenderness to palpation, nasal turbinates are erythematous, minimally erythematous posterior oropharynx, no cervical lymphadenopathy CARDIAC- RRR, no murmurs, rubs or gallops. RESP- CTAB, no wheezes or crackles. ABDOMEN- Soft, nontender, no guarding or rebound SKIN- Warm, dry, No rash or lesion. PSYCH- Normal mood and affect, appropriate thought content and speech.    Assessment & Plan:   See encounters tab for problem based medical decision making.   Patient discussed with Dr. Evette Doffing

## 2017-04-27 NOTE — Assessment & Plan Note (Signed)
HPI: She has a new 2 days of cough, sore throat, and headache. She was suffering from mild rhinitis earlier in the month but had improved prior to this episode. She has sick coworkers including one with a fever of 103F at work on Wednesday. She has tried taking high dose vitamin C for symptoms and left over tessalon capsules with not much benefit so far. She denies fever, chills, nausea, diarrhea, or body aches so far. She is also worried about things to watch out for with returning to work at [employer]. A: Viral URI with no features concerning for bacterial infection at this time. P: Recommended starting daily saline irrigation and instructions provided Daily fluticasone Start acetaminophen or ibuprofen for headache and if fever develops She may return to work exercising good hand and cough hygiene as long as afebrile

## 2017-04-30 NOTE — Progress Notes (Signed)
Internal Medicine Clinic Attending  Case discussed with Dr. Rice at the time of the visit.  We reviewed the resident's history and exam and pertinent patient test results.  I agree with the assessment, diagnosis, and plan of care documented in the resident's note.  

## 2017-05-02 ENCOUNTER — Telehealth: Payer: Self-pay | Admitting: Podiatry

## 2017-05-02 NOTE — Telephone Encounter (Signed)
Pt states she was in about a week or 2 ago and Dr. Paulla Dolly gave her a shot and she is still having a lot of pain. I told pt to continue the antiinflammatory medication, ice 3-4 times daily for 15 minutes each session and stay in the boot and I would transfer her to get an earlier appt.

## 2017-05-02 NOTE — Telephone Encounter (Signed)
Can a nurse from Dr. Mellody Drown office please give me a call back at [phone]. Thank you.

## 2017-05-18 MED FILL — OMEPRAZOLE 20 MG CAP: 20 | 90 days supply | Qty: 90 | Fill #1

## 2017-05-18 MED FILL — LISINOPRIL 20 MG TABS: 20 | 30 days supply | Qty: 30 | Fill #11

## 2017-05-21 ENCOUNTER — Ambulatory Visit: Payer: BLUE CROSS/BLUE SHIELD | Admitting: Orthotics

## 2017-05-21 DIAGNOSIS — M79672 Pain in left foot: Secondary | ICD-10-CM

## 2017-05-21 DIAGNOSIS — M722 Plantar fascial fibromatosis: Secondary | ICD-10-CM

## 2017-05-21 DIAGNOSIS — M79671 Pain in right foot: Secondary | ICD-10-CM

## 2017-05-21 NOTE — Progress Notes (Signed)
Patient came in today to pick up custom made foot orthotics.  The goals were accomplished and the patient reported no dissatisfaction with said orthotics.  Patient was advised of breakin period and how to report any issues. 

## 2017-05-25 ENCOUNTER — Ambulatory Visit (INDEPENDENT_AMBULATORY_CARE_PROVIDER_SITE_OTHER): Payer: BLUE CROSS/BLUE SHIELD | Admitting: Podiatry

## 2017-05-25 DIAGNOSIS — M722 Plantar fascial fibromatosis: Secondary | ICD-10-CM | POA: Diagnosis not present

## 2017-05-25 NOTE — Patient Instructions (Signed)
Pre-Operative Instructions  Congratulations, you have decided to take an important step towards improving your quality of life.  You can be assured that the doctors and staff at [facility] will be with you every step of the way.  Here are some important things you should know:  1. Plan to be at the surgery center/hospital at least 1 (one) hour prior to your scheduled time, unless otherwise directed by the surgical center/hospital staff.  You must have a responsible adult accompany you, remain during the surgery and drive you home.  Make sure you have directions to the surgical center/hospital to ensure you arrive on time. 2. If you are having surgery at [facility] or [facility] hospitals, you will need a copy of your medical history and physical form from your family physician within one month prior to the date of surgery. We will give you a form for your primary physician to complete.  3. We make every effort to accommodate the date you request for surgery.  However, there are times where surgery dates or times have to be moved.  We will contact you as soon as possible if a change in schedule is required.   4. No aspirin/ibuprofen for one week before surgery.  If you are on aspirin, any non-steroidal anti-inflammatory medications (Mobic, Aleve, Ibuprofen) should not be taken seven (7) days prior to your surgery.  You make take Tylenol for pain prior to surgery.  5. Medications - If you are taking daily heart and blood pressure medications, seizure, reflux, allergy, asthma, anxiety, pain or diabetes medications, make sure you notify the surgery center/hospital before the day of surgery so they can tell you which medications you should take or avoid the day of surgery. 6. No food or drink after midnight the night before surgery unless directed otherwise by surgical center/hospital staff. 7. No alcoholic beverages 24-hours prior to surgery.  No smoking 24-hours prior or 24-hours after  surgery. 8. Wear loose pants or shorts. They should be loose enough to fit over bandages, boots, and casts. 9. Don't wear slip-on shoes. Sneakers are preferred. 10. Bring your boot with you to the surgery center/hospital.  Also bring crutches or a walker if your physician has prescribed it for you.  If you do not have this equipment, it will be provided for you after surgery. 11. If you have not been contacted by the surgery center/hospital by the day before your surgery, call to confirm the date and time of your surgery. 12. Leave-time from work may vary depending on the type of surgery you have.  Appropriate arrangements should be made prior to surgery with your employer. 13. Prescriptions will be provided immediately following surgery by your doctor.  Fill these as soon as possible after surgery and take the medication as directed. Pain medications will not be refilled on weekends and must be approved by the doctor. 14. Remove nail polish on the operative foot and avoid getting pedicures prior to surgery. 15. Wash the night before surgery.  The night before surgery wash the foot and leg well with water and the antibacterial soap provided. Be sure to pay special attention to beneath the toenails and in between the toes.  Wash for at least three (3) minutes. Rinse thoroughly with water and dry well with a towel.  Perform this wash unless told not to do so by your physician.  Enclosed: 1 Ice pack (please put in freezer the night before surgery)   1 Hibiclens skin cleaner     Pre-op instructions  If you have any questions regarding the instructions, please do not hesitate to call our office.  [place]: [address], [place], [place] [address] -- [phone]  [place]: [address] , [place] [address] -- [phone]  [place]: [address]  [place], [place] [address] -- [phone]  [place]: [address], [address], [place], [place] [address] -- [phone]  Website:  [email or web address] 

## 2017-05-25 NOTE — Progress Notes (Signed)
Subjective:   Patient ID: [name], female   DOB: 56 y.o.   MRN: [id number]   HPI Patient presents stating my heel is still killing me and despite immobilization orthotics and previous injections I am not getting better and I need something done for the long-term   ROS      Objective:  Physical Exam  Neurovascular status intact negative Homans sign noted with patient's left heel still exquisitely tender in the medial band at the insertional point of the tendon into the calcaneus with fluid buildup.  She is wearing orthotics and has been utilizing a boot and at this time it continues to be an ongoing issue for     Assessment:  Acute plantar fasciitis left with inflammation fluid with failure to respond to numerous conservative treatments     Plan:  H&P condition reviewed and at this point I have recommended long-term surgical correction due to the chronic nature of symptoms failure to respond to conservative treatment.  Patient wants surgery and at this point I went ahead and I reviewed with her consent form going over alternative treatments complications associated with surgery there is no long-term care and.  Patient wants surgery understanding this signed consent form after extensive review it is scheduled for outpatient surgery

## 2017-05-30 ENCOUNTER — Other Ambulatory Visit: Payer: Self-pay

## 2017-05-30 ENCOUNTER — Ambulatory Visit: Payer: BLUE CROSS/BLUE SHIELD | Admitting: Internal Medicine

## 2017-05-30 DIAGNOSIS — F17210 Nicotine dependence, cigarettes, uncomplicated: Secondary | ICD-10-CM | POA: Diagnosis not present

## 2017-05-30 DIAGNOSIS — R05 Cough: Secondary | ICD-10-CM

## 2017-05-30 DIAGNOSIS — J069 Acute upper respiratory infection, unspecified: Secondary | ICD-10-CM

## 2017-05-30 DIAGNOSIS — B9789 Other viral agents as the cause of diseases classified elsewhere: Principal | ICD-10-CM

## 2017-05-30 MED ORDER — BENZONATATE 100 MG PO CAPS: 100.0000 mg | ORAL_CAPSULE | Freq: Three times a day (TID) | ORAL | 0 refills | Status: DC | PRN

## 2017-05-30 MED ORDER — GUAIFENESIN ER 600 MG PO TB12: 600.0000 mg | ORAL_TABLET | Freq: Two times a day (BID) | ORAL | 0 refills | Status: DC

## 2017-05-30 MED FILL — BENZONATATE 100 MG CAPS: 100 | 10 days supply | Qty: 30 | Fill #0

## 2017-05-30 NOTE — Patient Instructions (Signed)
FOLLOW-UP INSTRUCTIONS When: as needed if not improving For: cough and sinus infection   I have refilled your Tessalon Pearls to help with cough and prescribed Mucinex to help with loosening up your mucus.  Continue taking your allergy pills and using Flonase as you have been doing.  I agree with your plan to get a Neti Pot to help with congestion and nasal drainage.  In the future you may benefit from a daily allergy pill like Claritin or Allegra

## 2017-05-30 NOTE — Assessment & Plan Note (Signed)
Assessment: Viral URI with cough but no symptoms to suggest bacterial infection at this time.  Plan: - refill given for Tessalon pearls and Mucinex DM - continue anti histamine and flonase nasal spray - recommended to begin saline nasal irrigation - return to work note given as long as she remains afebrile and continues exercising good hand hygiene.

## 2017-05-30 NOTE — Progress Notes (Signed)
   CC: here for cough  HPI:  [name] is a 56 y.o. woman with PMHx as below here today for cough and sore throat.  States this is the 2nd time this season she has had these symptoms.  Most recent episode started with a scratchy and sore throat on Monday.  She now complains of a dry cough, sinus congestion without pain, post nasal drip, watery eyes, and rhinorrhea.  She has been using chlorpheniramine and Flonase with relief.  Previously had been given Tessalon pearls that also helped.  She intends to get a [employer] later today to help with her symptoms.  She denies any fever, chills, SOB, chest pain.  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  Past Medical History:  Diagnosis Date  . Anemia   . Arthritis   . GERD (gastroesophageal reflux disease)   . Hiatal hernia   . Hyperlipidemia   . Hypertension   . Plantar fasciitis    Review of Systems:  Please see pertinent ROS reviewed in HPI and problem based charting.   Physical Exam:  Vitals:   05/30/17 0944  BP: (!) 159/102  Pulse: 85  Temp: 98.4 F (36.9 C)  TempSrc: Oral  SpO2: 100%  Weight: 143 lb 9.6 oz (65.1 kg)  Height: 4\' 11"  (1.499 m)   Physical Exam  Constitutional: She is oriented to person, place, and time and well-developed, well-nourished, and in no distress.  Wearing protective mask  HENT:  Head: Normocephalic and atraumatic.  Mouth/Throat: Oropharynx is clear and moist. No oropharyngeal exudate.  Eyes: Conjunctivae and EOM are normal.  Neck: Normal range of motion. Neck supple.  Cardiovascular: Normal rate and regular rhythm.  Pulmonary/Chest: Effort normal and breath sounds normal. No respiratory distress. She has no wheezes.  Lymphadenopathy:    She has no cervical adenopathy.  Neurological: She is alert and oriented to person, place, and time.  Skin: Skin is warm and dry.     Assessment & Plan:   See Encounters Tab for problem based charting.  Patient discussed with  Dr. Dareen Piano.  Viral URI with cough Assessment: Viral URI with cough but no symptoms to suggest bacterial infection at this time.  Plan: - refill given for Tessalon pearls and Mucinex DM - continue anti histamine and flonase nasal spray - recommended to begin saline nasal irrigation - return to work note given as long as she remains afebrile and continues exercising good hand hygiene.

## 2017-05-30 NOTE — Progress Notes (Signed)
Internal Medicine Clinic Attending  Case discussed with Dr. Wallace at the time of the visit.  We reviewed the resident's history and exam and pertinent patient test results.  I agree with the assessment, diagnosis, and plan of care documented in the resident's note.  

## 2017-06-12 ENCOUNTER — Encounter: Payer: Self-pay | Admitting: Podiatry

## 2017-06-12 DIAGNOSIS — M722 Plantar fascial fibromatosis: Secondary | ICD-10-CM | POA: Diagnosis not present

## 2017-06-12 MED FILL — HYDROCODON-APAP 10-325: 10-325 | 5 days supply | Qty: 20 | Fill #0

## 2017-06-13 ENCOUNTER — Encounter: Payer: Self-pay | Admitting: *Deleted

## 2017-06-13 ENCOUNTER — Other Ambulatory Visit: Payer: Self-pay | Admitting: Internal Medicine

## 2017-06-13 ENCOUNTER — Telehealth: Payer: Self-pay | Admitting: *Deleted

## 2017-06-13 MED ORDER — ONDANSETRON HCL 4 MG PO TABS: 4.0000 mg | ORAL_TABLET | Freq: Three times a day (TID) | ORAL | 0 refills | Status: DC | PRN

## 2017-06-13 MED FILL — LISINOPRIL 20 MG TABLET: 20 | 90 days supply | Qty: 90 | Fill #0

## 2017-06-13 MED FILL — AMLODIPINE BESYLATE 2.5 MG: 2.5 | 90 days supply | Qty: 90 | Fill #1

## 2017-06-13 MED FILL — ONDANSETRON HCL 4 MG TABLET: 4 | 8 days supply | Qty: 20 | Fill #0

## 2017-06-13 NOTE — Telephone Encounter (Signed)
Pt presents to office stating the hydrocodone given yesterday after surgery is making her vomit. Pt states yesterday she took with food but not today. I told pt I would discuss with Dr. Paulla Dolly and call her, she should bring the hydrocodone to be disposed of per [facility] regulations. Pt also states she was told by Dr. Paulla Dolly she needed to be out of work 1-2 weeks and needs a note. I told her I would have that ready for her. Dr. Paulla Dolly states have pt try the hydrocodone 20-30 minutes after zofran 4mg  and with light snack or take ibuprofen. I informed pt of Dr. Mellody Drown orders and she stated understanding, and will pick up the letter for work when getting Zofran from [employer].

## 2017-06-21 ENCOUNTER — Encounter: Payer: Self-pay | Admitting: Podiatry

## 2017-06-21 ENCOUNTER — Ambulatory Visit (INDEPENDENT_AMBULATORY_CARE_PROVIDER_SITE_OTHER): Payer: BLUE CROSS/BLUE SHIELD | Admitting: Podiatry

## 2017-06-21 DIAGNOSIS — M722 Plantar fascial fibromatosis: Secondary | ICD-10-CM

## 2017-06-21 NOTE — Progress Notes (Signed)
Subjective: [name] is a 56 y.o. is seen today in office s/p left EPF preformed on 06/12/2017 with Dr. Paulla Dolly. They state their pain is improving but she still has some pain along the medial incision. Overall her foot feels better since the surgery and is very thankful for Dr. Paulla Dolly. She does wear her cam boot the majority of the time but she does state that she goes without the cam boot at times.  Denies any systemic complaints such as fevers, chills, nausea, vomiting. No calf pain, chest pain, shortness of breath.   Objective: General: No acute distress, AAOx3  DP/PT pulses palpable 2/4, CRT < 3 sec to all digits.  Protective sensation intact. Motor function intact.  Left foot: Incision is well coapted without any evidence of dehiscence and sutures are intact. There is no surrounding erythema, ascending cellulitis, fluctuance, crepitus, malodor, drainage/purulence. There is minimal edema around the surgical site. There is minimal pain along the surgical site along the medial incision but there is no significant pain on the course of insertion of plantar fascia. No other areas of tenderness to bilateral lower extremities.  No other open lesions or pre-ulcerative lesions.  No pain with calf compression, swelling, warmth, erythema.   Assessment and Plan:  Status post left EPF, doing well with no complications   -Treatment options discussed including all alternatives, risks, and complications -Incision is healing well.  Antibiotic ointment and a bandage was applied.  She can start to shower normally to continue with daily dressing changes as well.  Incisions appear to be healed but we will leave the sutures in for 1 more week. -Continuing cam boot for now.  She will be weightbearing as tolerated.  10 days with Dr. Paulla Dolly -Ice/elevation -Pain medication as needed. -She has a standing job for several hours a day when he not return to work until her follow-up with Dr. Paulla Dolly. -Monitor for any  clinical signs or symptoms of infection and DVT/PE and directed to call the office immediately should any occur or go to the ER. -Follow-up in 10 days with Dr. Paulla Dolly for suture removal or sooner if any problems arise. In the meantime, encouraged to call the office with any questions, concerns, change in symptoms.   Celesta Gentile, DPM

## 2017-07-04 ENCOUNTER — Ambulatory Visit (INDEPENDENT_AMBULATORY_CARE_PROVIDER_SITE_OTHER): Payer: BLUE CROSS/BLUE SHIELD | Admitting: Podiatry

## 2017-07-04 ENCOUNTER — Encounter: Payer: Self-pay | Admitting: Podiatry

## 2017-07-04 DIAGNOSIS — M722 Plantar fascial fibromatosis: Secondary | ICD-10-CM

## 2017-07-04 NOTE — Progress Notes (Signed)
Subjective:   Patient ID: [name], female   DOB: 57 y.o.   MRN: [id number]   HPI Patient states doing really well with the left foot with minimal discomfort   ROS      Objective:  Physical Exam  Neurovascular status intact negative Homans sign noted with wound edges well coapted medial lateral side of the left heel     Assessment:  Doing well post endoscopic release the medial fascial band left     Plan:  Reviewed condition and recommended continued compression elevation and gradual reduction of surgical shoe boot over the next 3 weeks.  Dispensed ankle compression stocking and surgical shoe.  Reappoint as needed

## 2017-07-10 ENCOUNTER — Telehealth: Payer: Self-pay | Admitting: *Deleted

## 2017-07-10 NOTE — Telephone Encounter (Signed)
Pt left name and phone number. 

## 2017-07-10 NOTE — Telephone Encounter (Signed)
Pt states she has worked Monday and today and her boot is throbbing and the ibuprofen is not helping. Left message informing pt she should begin ice therapy 3-4 times daily 10-15 minutes/session protecting the skin of the feet from the ice with fabric and I would inform Dr. Paulla Dolly of the problem.

## 2017-07-10 NOTE — Telephone Encounter (Signed)
Left message informing pt I was returning her call from yesterday and if she would call and leave a message with her question or concern often I could call again with an answer.

## 2017-07-12 ENCOUNTER — Ambulatory Visit: Payer: BLUE CROSS/BLUE SHIELD | Admitting: Internal Medicine

## 2017-07-13 NOTE — Telephone Encounter (Signed)
Left message informing pt that Dr. Paulla Dolly wanted her to make an appt to be seen on Monday.

## 2017-07-13 NOTE — Telephone Encounter (Signed)
If not better she should come in Monday to be evaluatede

## 2017-07-16 ENCOUNTER — Encounter: Payer: Self-pay | Admitting: Podiatry

## 2017-07-16 ENCOUNTER — Ambulatory Visit (INDEPENDENT_AMBULATORY_CARE_PROVIDER_SITE_OTHER): Payer: BLUE CROSS/BLUE SHIELD | Admitting: Podiatry

## 2017-07-16 DIAGNOSIS — M722 Plantar fascial fibromatosis: Secondary | ICD-10-CM

## 2017-07-16 MED ORDER — TRAMADOL HCL 50 MG PO TABS: 50.0000 mg | ORAL_TABLET | Freq: Three times a day (TID) | ORAL | 2 refills | Status: DC

## 2017-07-18 NOTE — Progress Notes (Signed)
Subjective:   Patient ID: [name], female   DOB: 57 y.o.   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  Reappoint to recheck in the next 4 weeks if symptoms persist

## 2017-07-27 NOTE — Progress Notes (Signed)
DOS 12.18.18 EPF Lt foot

## 2017-08-02 ENCOUNTER — Encounter: Payer: Self-pay | Admitting: Internal Medicine

## 2017-08-02 ENCOUNTER — Telehealth: Payer: Self-pay | Admitting: Internal Medicine

## 2017-08-02 ENCOUNTER — Ambulatory Visit (INDEPENDENT_AMBULATORY_CARE_PROVIDER_SITE_OTHER): Payer: BLUE CROSS/BLUE SHIELD | Admitting: Internal Medicine

## 2017-08-02 VITALS — BP 150/97 | HR 100 | Temp 98.2°F | Ht 59.0 in | Wt 146.4 lb

## 2017-08-02 DIAGNOSIS — J069 Acute upper respiratory infection, unspecified: Secondary | ICD-10-CM | POA: Diagnosis not present

## 2017-08-02 DIAGNOSIS — I10 Essential (primary) hypertension: Secondary | ICD-10-CM | POA: Diagnosis not present

## 2017-08-02 DIAGNOSIS — B9789 Other viral agents as the cause of diseases classified elsewhere: Principal | ICD-10-CM

## 2017-08-02 LAB — INFLUENZA PANEL BY PCR (TYPE A & B)
INFLBPCR: NEGATIVE
Influenza A By PCR: POSITIVE — AB

## 2017-08-02 MED ORDER — OSELTAMIVIR PHOSPHATE 75 MG PO CAPS: 75.0000 mg | ORAL_CAPSULE | Freq: Two times a day (BID) | ORAL | 0 refills | Status: AC

## 2017-08-02 MED FILL — OSELTAMIVIR PHOSPHATE 75 MG: 75 | 5 days supply | Qty: 10 | Fill #0

## 2017-08-02 NOTE — Progress Notes (Signed)
   CC: Cough  HPI:  [name] is a 57 y.o. with htn, hld, arthritis who presents with cough. Please see problem based charting for evaluation, assessment, and plan.  Past Medical History:  Diagnosis Date  . Anemia   . Arthritis   . GERD (gastroesophageal reflux disease)   . Hiatal hernia   . Hyperlipidemia   . Hypertension   . Plantar fasciitis    Review of Systems:    Cough, vomiting, headache Denies sneezing, sore throat, muscle aches  Physical Exam:  Vitals:   08/02/17 0931  BP: (!) 150/97  Pulse: 100  Temp: 98.2 F (36.8 C)  TempSrc: Oral  SpO2: 97%  Weight: 146 lb 6.4 oz (66.4 kg)  Height: 4\' 11"  (1.499 m)   Physical Exam  Constitutional: She appears well-developed and well-nourished. No distress.  HENT:  Head: Normocephalic and atraumatic.  Right Ear: Tympanic membrane is not injected and not erythematous.  Left Ear: Tympanic membrane is not injected and not erythematous.  Mouth/Throat: Oropharynx is clear and moist. No oropharyngeal exudate.  Anterior posterior cervical lymphadenopathy   Eyes: Conjunctivae are normal. No scleral icterus.  Cardiovascular: Normal rate, regular rhythm and normal heart sounds.  No murmur heard. Respiratory: Effort normal and breath sounds normal. No respiratory distress. She has no wheezes.  GI: Soft. Bowel sounds are normal. She exhibits no distension. There is no tenderness.  Musculoskeletal: She exhibits no edema.  Neurological: She is alert.  Skin: She is not diaphoretic. No erythema.  Psychiatric: She has a normal mood and affect. Her behavior is normal. Judgment and thought content normal.    Assessment & Plan:   See Encounters Tab for problem based charting.  Patient discussed with Dr. Evette Doffing

## 2017-08-02 NOTE — Assessment & Plan Note (Signed)
The patient presents with a one-day history of productive cough bringing up clear mucus.  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  She has accompanied headache and bronchospasms that produce vomiting.  She states that she has vomited twice this morning.  She denies any muscle aches.  Patient used pseudoephedrine without relief.  The patient is currently afebrile and blood pressure is elevated at 150/97.  Physical exam the patient does not have any pharyngeal erythema or exudate, maxillary or ethmoid sinus tenderness, no erythema noted on external ear canal or tympanic membrane.  The patient does have frontal sinus tenderness and sinus pressure is noted.  Based on the acute nature of the patient's symptoms flu test was done to rule out for influenza.  The patient signs and history are significant for an acute viral infection.  -Influenza a and B test done and is pending -nasal sinus rinse -plenty of fluids and warm tea, etc -Core Seiden for cough

## 2017-08-02 NOTE — Telephone Encounter (Signed)
Patient needs a note saying that she was test positive for the flu

## 2017-08-02 NOTE — Addendum Note (Signed)
Addended by: Lars Mage on: 08/02/2017 03:41 PM   Modules accepted: Orders

## 2017-08-02 NOTE — Assessment & Plan Note (Signed)
The patient presents with blood pressure of 150/97. she is currently taking amlodipine 2.5mg  qd and lisinopril 20mg  qd.  States that she is used pseudoephedrine which can elevate the blood pressure.  Suggest retesting blood pressure at visit when she does not have upper respiratory infection.

## 2017-08-02 NOTE — Telephone Encounter (Signed)
The letter is done, printed signed and placed in chillons basket. It is also available in communications tab. Thank you!

## 2017-08-02 NOTE — Patient Instructions (Signed)
It was a pleasure to see you today [name]. Please make the following changes:  -please use nasal sinus rinse  -please drink plenty of fluids. Ex.) warm tea, honey, etc  If you have any questions or concerns, please call our clinic at [phone] between 9am-5pm and after hours call [phone] and ask for the internal medicine resident on call. If you feel you are having a medical emergency please call 911.   Thank you, we look forward to help you remain healthy!  Lars Mage, MD Internal Medicine PGY1   Upper Respiratory Infection, Adult Most upper respiratory infections (URIs) are caused by a virus. A URI affects the nose, throat, and upper air passages. The most common type of URI is often called "the common cold." Follow these instructions at home:  Take medicines only as told by your doctor.  Gargle warm saltwater or take cough drops to comfort your throat as told by your doctor.  Use a warm mist humidifier or inhale steam from a shower to increase air moisture. This may make it easier to breathe.  Drink enough fluid to keep your pee (urine) clear or pale yellow.  Eat soups and other clear broths.  Have a healthy diet.  Rest as needed.  Go back to work when your fever is gone or your doctor says it is okay. ? You may need to stay home longer to avoid giving your URI to others. ? You can also wear a face mask and wash your hands often to prevent spread of the virus.  Use your inhaler more if you have asthma.  Do not use any tobacco products, including cigarettes, chewing tobacco, or electronic cigarettes. If you need help quitting, ask your doctor. Contact a doctor if:  You are getting worse, not better.  Your symptoms are not helped by medicine.  You have chills.  You are getting more short of breath.  You have brown or red mucus.  You have yellow or brown discharge from your nose.  You have pain in your face, especially when you bend forward.  You  have a fever.  You have puffy (swollen) neck glands.  You have pain while swallowing.  You have white areas in the back of your throat. Get help right away if:  You have very bad or constant: ? Headache. ? Ear pain. ? Pain in your forehead, behind your eyes, and over your cheekbones (sinus pain). ? Chest pain.  You have long-lasting (chronic) lung disease and any of the following: ? Wheezing. ? Long-lasting cough. ? Coughing up blood. ? A change in your usual mucus.  You have a stiff neck.  You have changes in your: ? Vision. ? Hearing. ? Thinking. ? Mood. This information is not intended to replace advice given to you by your health care provider. Make sure you discuss any questions you have with your health care provider. Document Released: 11/29/2007 Document Revised: 02/13/2016 Document Reviewed: 09/17/2013 Elsevier Interactive Patient Education  2018 [employer].

## 2017-08-03 ENCOUNTER — Other Ambulatory Visit: Payer: Self-pay

## 2017-08-03 ENCOUNTER — Emergency Department (HOSPITAL_COMMUNITY): Payer: BLUE CROSS/BLUE SHIELD

## 2017-08-03 ENCOUNTER — Emergency Department (HOSPITAL_COMMUNITY)
Admission: EM | Admit: 2017-08-03 | Discharge: 2017-08-03 | Disposition: A | Discharge status: Home / Self Care | Payer: BLUE CROSS/BLUE SHIELD | Attending: Emergency Medicine | Admitting: Emergency Medicine

## 2017-08-03 ENCOUNTER — Encounter (HOSPITAL_COMMUNITY): Payer: Self-pay | Admitting: *Deleted

## 2017-08-03 DIAGNOSIS — Z79899 Other long term (current) drug therapy: Secondary | ICD-10-CM | POA: Insufficient documentation

## 2017-08-03 DIAGNOSIS — F17210 Nicotine dependence, cigarettes, uncomplicated: Secondary | ICD-10-CM | POA: Diagnosis not present

## 2017-08-03 DIAGNOSIS — M791 Myalgia, unspecified site: Secondary | ICD-10-CM | POA: Diagnosis present

## 2017-08-03 DIAGNOSIS — I10 Essential (primary) hypertension: Secondary | ICD-10-CM | POA: Diagnosis not present

## 2017-08-03 DIAGNOSIS — J101 Influenza due to other identified influenza virus with other respiratory manifestations: Secondary | ICD-10-CM

## 2017-08-03 DIAGNOSIS — J09X9 Influenza due to identified novel influenza A virus with other manifestations: Secondary | ICD-10-CM | POA: Insufficient documentation

## 2017-08-03 LAB — COMPREHENSIVE METABOLIC PANEL
ALT: 44 U/L (ref 14–54)
AST: 37 U/L (ref 15–41)
Albumin: 3.7 g/dL (ref 3.5–5.0)
Alkaline Phosphatase: 76 U/L (ref 38–126)
Anion gap: 15 (ref 5–15)
BUN: 6 mg/dL (ref 6–20)
CHLORIDE: 104 mmol/L (ref 101–111)
CO2: 22 mmol/L (ref 22–32)
Calcium: 9.2 mg/dL (ref 8.9–10.3)
Creatinine, Ser: 0.69 mg/dL (ref 0.44–1.00)
GFR calc Af Amer: >60 mL/min (ref >60)
Glucose, Bld: 98 mg/dL (ref 65–99)
POTASSIUM: 3.1 mmol/L — AB (ref 3.5–5.1)
SODIUM: 141 mmol/L (ref 135–145)
Total Bilirubin: 0.5 mg/dL (ref 0.3–1.2)
Total Protein: 7.2 g/dL (ref 6.5–8.1)

## 2017-08-03 LAB — CBC WITH DIFFERENTIAL/PLATELET
Basophils Absolute: 0 10*3/uL (ref 0.0–0.1)
Basophils Relative: 0 %
Eosinophils Absolute: 0.1 10*3/uL (ref 0.0–0.7)
Eosinophils Relative: 2 %
HCT: 34.2 % — ABNORMAL LOW (ref 36.0–46.0)
HEMOGLOBIN: 10.6 g/dL — AB (ref 12.0–15.0)
LYMPHS ABS: 1.1 10*3/uL (ref 0.7–4.0)
LYMPHS PCT: 19 %
MCH: 22.5 pg — AB (ref 26.0–34.0)
MCHC: 31 g/dL (ref 30.0–36.0)
MCV: 72.6 fL — AB (ref 78.0–100.0)
MONOS PCT: 7 %
Monocytes Absolute: 0.4 10*3/uL (ref 0.1–1.0)
NEUTROS PCT: 72 %
Neutro Abs: 4 10*3/uL (ref 1.7–7.7)
Platelets: 286 10*3/uL (ref 150–400)
RBC: 4.71 MIL/uL (ref 3.87–5.11)
RDW: 15.1 % (ref 11.5–15.5)
WBC: 5.5 10*3/uL (ref 4.0–10.5)

## 2017-08-03 LAB — I-STAT CG4 LACTIC ACID, ED: LACTIC ACID, VENOUS: 1.01 mmol/L (ref 0.5–1.9)

## 2017-08-03 MED ORDER — POTASSIUM CHLORIDE ER 20 MEQ PO TBCR: 10.0000 meq | EXTENDED_RELEASE_TABLET | Freq: Every day | ORAL | 0 refills | Status: DC

## 2017-08-03 MED ORDER — BENZONATATE 100 MG PO CAPS: 100.0000 mg | ORAL_CAPSULE | Freq: Three times a day (TID) | ORAL | 0 refills | Status: DC

## 2017-08-03 MED ORDER — ALBUTEROL SULFATE HFA 108 (90 BASE) MCG/ACT IN AERS
1.0000 | INHALATION_SPRAY | Freq: Once | RESPIRATORY_TRACT | Status: AC
  Administered 2017-08-03: 1 via RESPIRATORY_TRACT
  Filled 2017-08-03: qty 6.7

## 2017-08-03 MED ORDER — ACETAMINOPHEN 325 MG PO TABS
650.0000 mg | ORAL_TABLET | Freq: Once | ORAL | Status: AC
  Administered 2017-08-03: 650 mg via ORAL
  Filled 2017-08-03: qty 2

## 2017-08-03 MED ORDER — OPTICHAMBER DIAMOND MISC
1.0000 | Freq: Once | Status: AC
  Administered 2017-08-03: 1
  Filled 2017-08-03: qty 1

## 2017-08-03 MED ORDER — ACETAMINOPHEN 325 MG PO TABS: 325.0000 mg | ORAL_TABLET | Freq: Once | ORAL | Status: DC

## 2017-08-03 MED ORDER — POTASSIUM CHLORIDE CRYS ER 20 MEQ PO TBCR
40.0000 meq | EXTENDED_RELEASE_TABLET | Freq: Once | ORAL | Status: AC
  Administered 2017-08-03: 40 meq via ORAL
  Filled 2017-08-03: qty 2

## 2017-08-03 MED ORDER — DM-GUAIFENESIN ER 30-600 MG PO TB12: 1.0000 | ORAL_TABLET | Freq: Two times a day (BID) | ORAL | 0 refills | Status: AC

## 2017-08-03 MED FILL — BENZONATATE 100 MG CAPS: 100 | 5 days supply | Qty: 15 | Fill #0

## 2017-08-03 MED FILL — POTASSIUM CL ER 20 MEQ TABL: 20 | 3 days supply | Qty: 3 | Fill #0

## 2017-08-03 NOTE — Telephone Encounter (Signed)
Why did the patient go to the ED? The patient was given tamiflu prescription at her office visit and she was given a work note. The patient wanted it to specify that she has flu so it was edited and available within 1 hour of her calling.

## 2017-08-03 NOTE — Progress Notes (Signed)
Internal Medicine Clinic Attending  Case discussed with Dr. Chundi at the time of the visit.  We reviewed the resident's history and exam and pertinent patient test results.  I agree with the assessment, diagnosis, and plan of care documented in the resident's note. 

## 2017-08-03 NOTE — Telephone Encounter (Signed)
Pt stated she had to go to the ER and they gave her a note.

## 2017-08-03 NOTE — ED Triage Notes (Signed)
States she was seen at her PCP yest and dx. With the flu. Unable to get medication until today,

## 2017-08-03 NOTE — ED Provider Notes (Signed)
[facility] EMERGENCY DEPARTMENT Provider Note   CSN: [id number] Arrival date & time: 08/03/17  0535     History   Chief Complaint Chief Complaint  Patient presents with  . Generalized Body Aches    HPI [name] is a 57 y.o. female.  HPI   Patient is a 57 year old female with a history of iron deficiency anemia, hypertension, and GERD presenting for increasing cough and fever.  Patient had confirmed diagnosis of influenza A yesterday at her primary care provider and presents with concern due to secondary complications..  Patient reports she was unable to get the Tamiflu prescription until today, and has not taken any medicines to help with her symptoms since her diagnosis yesterday.  Patient reports that when she got home and try to go to sleep last night, she had increasing cough that made her short of breath.  Patient reports that she was not short of breath and did not have chest pain in between the periods of coughing fits.  Patient reports cough is productive of white sputum.  Patient also reports she had increasing myalgia, chills, and non-resolving fever.  No antipyretics prior to arrival. Patient to take Sudafed yesterday morning with some symptomatic relief.  Past Medical History:  Diagnosis Date  . Anemia   . Arthritis   . GERD (gastroesophageal reflux disease)   . Hiatal hernia   . Hyperlipidemia   . Hypertension   . Plantar fasciitis     Patient Active Problem List   Diagnosis Date Noted  . Viral URI with cough 04/26/2017  . Sinusitis 03/21/2017  . 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 Acute right-sided thoracic back pain 12/08/2016  . Right foot pain 11/23/2016  . Radicular pain of right lower extremity 07/07/2016  . H/O TIA (transient ischemic attack) and stroke 06/12/2014  . Cervical radiculitis 02/12/2014  . Healthcare maintenance 04/30/2013  . Shoulder pain, left 12/01/2011  . Essential hypertension, benign  01/24/2010  . Anemia 01/11/2010  . Hyperlipemia 05/28/2007  . TOBACCO ABUSE 05/28/2007  . GERD 05/28/2007    Past Surgical History:  Procedure Laterality Date  . ABDOMINAL HYSTERECTOMY  12/09   Secondary to fibroids  . CESAREAN SECTION      OB History    No data available       Home Medications    Prior to Admission medications   Medication Sig Start Date End Date Taking? Authorizing Provider  acetaminophen (TYLENOL) 325 MG tablet Take 2 tablets (650 mg total) by mouth every 6 (six) hours as needed for mild pain, moderate pain or fever. 03/17/17   Caccavale, Sophia, PA-C  amLODipine (NORVASC) 2.5 MG tablet TAKE 1 TABLET BY MOUTH DAILY 03/19/17   Bartholomew Crews, MD  benzonatate (TESSALON) 100 MG capsule Take 1 capsule (100 mg total) by mouth every 8 (eight) hours. 08/03/17   Langston Masker B, PA-C  chlorpheniramine (CHLOR-TRIMETON) 4 MG tablet Take 1 tablet (4 mg total) by mouth every 8 (eight) hours as needed for allergies (Congestion). 04/10/17   Velna Ochs, MD  dextromethorphan-guaiFENesin [facility] DM) 30-600 MG 12hr tablet Take 1 tablet by mouth 2 (two) times daily for 7 days. 08/03/17 08/10/17  Langston Masker B, PA-C  diclofenac (VOLTAREN) 75 MG EC tablet Take 1 tablet (75 mg total) by mouth 2 (two) times daily. 02/09/17   Wallene Huh, DPM  ferrous sulfate (IRON SUPPLEMENT) 325 (65 FE) MG tablet Take 1 tablet (325 mg total) by mouth daily with breakfast.  03/01/16   Bartholomew Crews, MD  FLUARIX QUADRIVALENT 0.5 ML injection inject 0.5 milliliter intramuscularly 03/05/17   [provider]  fluticasone (FLONASE) 50 MCG/ACT nasal spray Place 1 spray into both nostrils daily. 04/26/17   Rice, Resa Miner, MD  guaiFENesin (MUCINEX) 600 MG 12 hr tablet Take 1 tablet (600 mg total) by mouth 2 (two) times daily. 05/30/17 05/30/18  Jule Ser, DO  ibuprofen (ADVIL,MOTRIN) 600 MG tablet Take 1 tablet (600 mg total) by mouth every 6 (six) hours as needed. 11/23/16    Ahmed, Chesley Mires, MD  lisinopril (PRINIVIL,ZESTRIL) 20 MG tablet TAKE 1 TABLET BY MOUTH DAILY. 06/13/17   Bartholomew Crews, MD  omeprazole (PRILOSEC) 20 MG capsule TAKE 1 CAPSULE BY MOUTH ONCE DAILY 02/20/17   Bartholomew Crews, MD  ondansetron (ZOFRAN) 4 MG tablet Take 1 tablet (4 mg total) by mouth every 8 (eight) hours as needed for nausea or vomiting. 06/13/17   Regal, Tamala Fothergill, DPM  oseltamivir (TAMIFLU) 75 MG capsule Take 1 capsule (75 mg total) by mouth 2 (two) times daily for 5 days. 08/02/17 08/07/17  Lars Mage, MD  Potassium Chloride ER 20 MEQ TBCR Take 10 mEq by mouth daily for 3 days. 08/03/17 08/06/17  Langston Masker B, PA-C  sodium chloride (OCEAN) 0.65 % SOLN nasal spray Place 1 spray into both nostrils as needed for congestion. 04/26/17   Rice, Resa Miner, MD  traMADol (ULTRAM) 50 MG tablet Take 1 tablet (50 mg total) by mouth every 6 (six) hours as needed. 11/30/16   Bartholomew Crews, MD  traMADol (ULTRAM) 50 MG tablet Take 1 tablet (50 mg total) by mouth 3 (three) times daily. 07/16/17   Wallene Huh, DPM  dicyclomine (BENTYL) 20 MG tablet Take 1 tablet (20 mg total) by mouth 2 (two) times daily. Patient not taking: Reported on 05/17/2014 10/02/13 05/17/14  Larene Pickett, PA-C    Family History Family History  Problem Relation Age of Onset  . Cancer Mother 66       pancreatic cancer  . Pancreatic cancer Mother   . Cancer Father 42       throat cancer  . Cancer Maternal Aunt 60       breast cancer  . Cancer Other        Died  from Colon cancer in 66's  . Colon cancer Maternal Uncle   . Colon cancer Paternal Uncle     Social History Social History   Tobacco Use  . Smoking status: Current Every Day Smoker    Packs/day: 0.10    Types: Cigarettes  . Smokeless tobacco: Never Used  . Tobacco comment: Down to 1-2 cig/day.  Substance Use Topics  . Alcohol use: Yes    Comment: occasionally  . Drug use: No     Allergies   Aspirin and Hydromorphone  hcl   Review of Systems Review of Systems  Constitutional: Positive for chills and fever.  HENT: Positive for congestion and rhinorrhea. Negative for sore throat and trouble swallowing.   Respiratory: Positive for cough. Negative for wheezing and stridor.   Gastrointestinal: Positive for nausea. Negative for abdominal pain and vomiting.  Musculoskeletal: Negative for neck pain and neck stiffness.     Physical Exam Updated Vital Signs BP 134/81 (BP Location: Right Arm)   Pulse 98   Temp 99.3 F (37.4 C) (Oral)   Resp 18   Ht 4\' 11"  (1.499 m)   Wt 66.2 kg (146 lb)   SpO2  96%   BMI 29.49 kg/m   Physical Exam  Constitutional: She appears well-developed and well-nourished. No distress.  Sitting comfortably in bed.  HENT:  Head: Normocephalic and atraumatic.  Cobblestoning of posterior pharynx noted.  No erythema or exudate.  Eyes: Conjunctivae are normal. Right eye exhibits no discharge. Left eye exhibits no discharge.  EOMs normal to gross examination.  Neck: Normal range of motion.  Small bilateral anterior cervical lymphadenopathy.  Cardiovascular: Normal rate, regular rhythm and normal heart sounds.  Pulmonary/Chest: Effort normal and breath sounds normal. She has no wheezes. She has no rales.  Normal respiratory effort. Patient converses comfortably. No audible wheeze or stridor.  Abdominal: She exhibits no distension.  Musculoskeletal: Normal range of motion.  Neurological: She is alert.  Cranial nerves intact to gross observation. Patient moves extremities without difficulty.  Skin: Skin is warm and dry. She is not diaphoretic.  Psychiatric: She has a normal mood and affect. Her behavior is normal. Judgment and thought content normal.  Nursing note and vitals reviewed.    ED Treatments / Results  Labs (all labs ordered are listed, but only abnormal results are displayed) Labs Reviewed  COMPREHENSIVE METABOLIC PANEL - Abnormal; Notable for the following  components:      Result Value   Potassium 3.1 (*)    All other components within normal limits  CBC WITH DIFFERENTIAL/PLATELET - Abnormal; Notable for the following components:   Hemoglobin 10.6 (*)    HCT 34.2 (*)    MCV 72.6 (*)    MCH 22.5 (*)    All other components within normal limits  I-STAT CG4 LACTIC ACID, ED  I-STAT CG4 LACTIC ACID, ED    EKG  EKG Interpretation None       Radiology Dg Chest 2 View  Result Date: 08/03/2017 CLINICAL DATA:  Acute onset of cough, body aches and headache. EXAM: CHEST  2 VIEW COMPARISON:  Chest radiograph performed 03/02/2016 FINDINGS: The lungs are well-aerated and clear. There is no evidence of focal opacification, pleural effusion or pneumothorax. The heart is borderline normal in size. No acute osseous abnormalities are seen. IMPRESSION: No acute cardiopulmonary process seen. Electronically Signed   By: Garald Balding M.D.   On: 08/03/2017 06:28    Procedures Procedures (including critical care time)  Medications Ordered in ED Medications  potassium chloride SA (K-DUR,KLOR-CON) CR tablet 40 mEq (not administered)  albuterol (PROVENTIL HFA;VENTOLIN HFA) 108 (90 Base) MCG/ACT inhaler 1 puff (not administered)  optichamber diamond 1 each (not administered)  acetaminophen (TYLENOL) tablet 650 mg (650 mg Oral Given 08/03/17 9381)     Initial Impression / Assessment and Plan / ED Course  I have reviewed the triage vital signs and the nursing notes.  Pertinent labs & imaging results that were available during my care of the patient were reviewed by me and considered in my medical decision making (see chart for details).    Patient is nontoxic-appearing, afebrile, and non-tachycardic on my examination.  Patient was initially febrile on presentation to the emergency department to 101.4.  This resolved with acetaminophen.  Patient is presenting with a confirmed diagnosis of the flu and concern about worsening symptoms.  Patient has not had  any symptomatic relief.  At this time I low suspicion for patient's shortness of breath with coughing is having a cardiac cause, as patient reports that it resolves after the coughing fit, and is not exertional.  Chest x-ray, reviewed by me, demonstrates no evidence of infiltrate or other cardiopulmonary disease.  Patient has active Tamiflu prescription that she needs to fill today.  We will additionally add on Mucinex, Tessalon Perles and dispense albuterol.  Patient can return precautions for any decreased level of consciousness, worsening chest pain, shortness breath, dizziness, lightheadedness, syncope or presyncope.  Patient is in understanding and agrees with the plan of care.  Blood pressure elevated today.  This was noted by primary care office in her visit yesterday, and patient is currently on amlodipine and lisinopril.  Patient also taking pseudoephedrine.  Hypokalemia of 3.1 noted today.  Repleted in the urgency department.  Patient to receive outpatient prescription of potassium.  Additionally, hemoglobin is 10.6, which is per patient's baseline based on my independent chart review.  Patient has hysterectomy as confirmation that she is not pregnant.  Final Clinical Impressions(s) / ED Diagnoses   Final diagnoses:  Influenza A      Albesa Seen, PA-C 08/03/17 0881    Malvin Johns, MD 08/03/17 1007

## 2017-08-03 NOTE — Discharge Instructions (Signed)
Please read and follow all provided instructions.  Your diagnoses today include:  1. Influenza A    You were diagnosed with influenza.  This is a respiratory illness caused by a virus.  It is very active at this time of year.  Please make sure that you cover your mouth when coughing, and refrain from being around friends or family members who have any conditions that would suppress the immune system.  You appear to have an upper respiratory infection (URI). An upper respiratory tract infection, or cold, is a viral infection of the air passages leading to the lungs. It should improve gradually after 5-7 days. You may have a lingering cough that lasts for 2- 4 weeks after the infection.  Tests performed today include: Vital signs. See below for your results today.  Potassium is 3.1 today.  We will replace this.  Your anemia as per your baseline.  Medications prescribed:   Take any prescribed medications only as directed. Treatment for your infection is aimed at treating the symptoms. There are no medications, such as antibiotics, that will cure your infection.   Tessalon Perles.  This is a medication to help suppress her cough.  Please make sure you keep this medicine away from children, as it can be toxic to them and overdose.  Mucinex DM.  This is to help loosen up the mucus in your nose and chest.  Potassium.  This medicine should be taken 1 pill a day for the next 3 days.  Please use her inhaler, 1 puff, as needed for coughing fits.  Home care instructions:  Follow any educational materials contained in this packet.   Your illness is contagious and can be spread to others, especially during the first 3 or 4 days. It cannot be cured by antibiotics or other medicines. Take basic precautions such as washing your hands often, covering your mouth when you cough or sneeze, and avoiding public places where you could spread your illness to others.   Please continue drinking plenty of fluids.   Use over-the-counter medicines as needed as directed on packaging for symptom relief.  You may also use ibuprofen or tylenol as directed on packaging for pain or fever.  Do not take multiple medicines containing Tylenol or acetaminophen to avoid taking too much of this medication.  Follow-up instructions: Please follow-up with your primary care provider in the next 3 days for further evaluation of your symptoms if you are not feeling better.   Return instructions:  Please return to the Emergency Department if you experience worsening symptoms.  RETURN IMMEDIATELY IF you develop shortness of breath, confusion or altered mental status, a new rash, become dizzy, faint, or poorly responsive, or are unable to be cared for at home. Please return if you have persistent vomiting and cannot keep down fluids or develop a fever that is not controlled by tylenol or motrin.   Please return if you have any other emergent concerns.  Additional Information:  Your vital signs today were: BP 134/81 (BP Location: Right Arm)    Pulse 98    Temp 99.3 F (37.4 C) (Oral)    Resp 18    Ht 4\' 11"  (1.499 m)    Wt 66.2 kg (146 lb)    SpO2 96%    BMI 29.49 kg/m  If your blood pressure (BP) was elevated above 135/85 this visit, please have this repeated by your doctor within one month. --------------

## 2017-08-03 NOTE — Telephone Encounter (Signed)
According to the chart, she went to ED b/c body aches/flu.

## 2017-08-06 ENCOUNTER — Telehealth: Payer: Self-pay | Admitting: Internal Medicine

## 2017-08-06 ENCOUNTER — Ambulatory Visit (INDEPENDENT_AMBULATORY_CARE_PROVIDER_SITE_OTHER): Payer: BLUE CROSS/BLUE SHIELD | Admitting: Internal Medicine

## 2017-08-06 VITALS — BP 134/81 | HR 97 | Temp 99.1°F | Ht 59.0 in | Wt 137.3 lb

## 2017-08-06 DIAGNOSIS — J101 Influenza due to other identified influenza virus with other respiratory manifestations: Secondary | ICD-10-CM | POA: Diagnosis not present

## 2017-08-06 DIAGNOSIS — E876 Hypokalemia: Secondary | ICD-10-CM

## 2017-08-06 MED ORDER — GUAIFENESIN-CODEINE 100-10 MG/5ML PO SOLN: 5.0000 mL | Freq: Three times a day (TID) | ORAL | 0 refills | Status: DC | PRN

## 2017-08-06 NOTE — Telephone Encounter (Signed)
   Reason for call:   I received a call from Ms. [name] at 4:42 AM indicating that she was diagnosed with the flu and is having persistent cough.   Pertinent Data:   Patient was seen in clinic on 2/7 and diagnosed with flu. She then went to the ED on 2/8 for body aches.   She reports starting Tamiflu, Mucinex, Tylenol, and using an albuterol inhaler without significant relief of her dry cough and shortness of breath. She has had subjective fevers and chills as well. She is attempting to stay adequately hydrated.   Assessment / Plan / Recommendations:   She most likely has continued Influenza related upper respiratory symptoms without relief from current management, less likely to have a secondary bacterial pneumonia at this time. I have advised her to call our clinic during open hours to schedule an acute visit for further evaluation.  As always, pt is advised that if symptoms worsen or new symptoms arise, they should go to an urgent care facility or to to ER for further evaluation.   Zada Finders, MD   08/06/2017, 5:13 AM

## 2017-08-06 NOTE — Assessment & Plan Note (Signed)
Assessment: Patient has confirmed influenza A and continues to be symptomatic.  ED visit over the weekend and given symptomatic treatment in addition to her Tamiflu prescription.  Her main complaint is the persistent, dry cough that leads to post-tussive emesis and keeps her up at night.  Plan: - Continue Tamiflu - Rx given for Robitussin AC to help with symptoms - Continue other symptomatic treatment as before. - RTC if needed or worsening.

## 2017-08-06 NOTE — Assessment & Plan Note (Signed)
Assessment: Patient was hypokalemic to 3.1 at the ED.  She was given replacement there and also prescribed K Dur for a short course.  She reports finishing this medication.  Plan: - repeat BMET today to assess K and creatinine in light of her illness.

## 2017-08-06 NOTE — Patient Instructions (Signed)
FOLLOW-UP INSTRUCTIONS When: as needed with primary care doctor For: routine follow up What to bring: current meds  I have given you a prescription for a stronger cough medicine. We will check labs to make sure you are not too dehydrated.

## 2017-08-06 NOTE — Progress Notes (Signed)
   CC: here for the flu  HPI:  [name] is a 57 y.o. woman with PMHx as below here today for continued influenza symtoms.  Diagnosed with flu A on 2/7 in clinic.  Rx for Tamiflu sent and started a couple days later.  Went to the ED over the weekend where labs showed K of 3.1, otherwise labs at baseline.  CXR looked okay. Given Tessalon pearls and mucinex DM that has provided no relief.  She is continued to feel very fatigued, achy and coughing.  This cough has led to post-tussive emesis.  She denies any nausea, chest pain.  She cannot sleep because of her cough.  Past Medical History:  Diagnosis Date  . Anemia   . Arthritis   . GERD (gastroesophageal reflux disease)   . Hiatal hernia   . Hyperlipidemia   . Hypertension   . Plantar fasciitis    Review of Systems:  Please see pertinent ROS reviewed in HPI and problem based charting.   Physical Exam:  Vitals:   08/06/17 1053  BP: 134/81  Pulse: 97  Temp: 99.1 F (37.3 C)  TempSrc: Oral  SpO2: 100%  Weight: 137 lb 4.8 oz (62.3 kg)  Height: 4\' 11"  (1.499 m)   Physical Exam  Constitutional:  Fatigued, pleasant, AA woman, coughing, NAD  HENT:  Head: Normocephalic and atraumatic.  Cardiovascular: Normal rate and regular rhythm.  Pulmonary/Chest: Effort normal and breath sounds normal. No respiratory distress. She has no wheezes. She has no rales.  Skin: Skin is warm and dry.  Psychiatric: Mood and affect normal.     Assessment & Plan:   See Encounters Tab for problem based charting.  Patient discussed with Dr. Rebeca Alert.  Influenza A Assessment: Patient has confirmed influenza A and continues to be symptomatic.  ED visit over the weekend and given symptomatic treatment in addition to her Tamiflu prescription.  Her main complaint is the persistent, dry cough that leads to post-tussive emesis and keeps her up at night.  Plan: - Continue Tamiflu - Rx given for Robitussin AC to help with symptoms - Continue other  symptomatic treatment as before. - RTC if needed or worsening.  Hypokalemia Assessment: Patient was hypokalemic to 3.1 at the ED.  She was given replacement there and also prescribed K Dur for a short course.  She reports finishing this medication.  Plan: - repeat BMET today to assess K and creatinine in light of her illness.

## 2017-08-07 LAB — BMP8+ANION GAP
Anion Gap: 21 mmol/L — ABNORMAL HIGH (ref 10.0–18.0)
BUN/Creatinine Ratio: 15 (ref 9–23)
BUN: 13 mg/dL (ref 6–24)
CO2: 16 mmol/L — ABNORMAL LOW (ref 20–29)
Calcium: 9.5 mg/dL (ref 8.7–10.2)
Chloride: 103 mmol/L (ref 96–106)
Creatinine, Ser: 0.84 mg/dL (ref 0.57–1.00)
GFR calc Af Amer: 90 mL/min/{1.73_m2} (ref >59)
GFR calc non Af Amer: 78 mL/min/{1.73_m2} (ref >59)
Glucose: 91 mg/dL (ref 65–99)
Potassium: 4.1 mmol/L (ref 3.5–5.2)
Sodium: 140 mmol/L (ref 134–144)

## 2017-08-07 NOTE — Progress Notes (Signed)
Internal Medicine Clinic Attending  Case discussed with Dr. Wallace  at the time of the visit.  We reviewed the resident's history and exam and pertinent patient test results.  I agree with the assessment, diagnosis, and plan of care documented in the resident's note.  Alexander N Raines, MD   

## 2017-08-09 ENCOUNTER — Encounter: Payer: Self-pay | Admitting: Internal Medicine

## 2017-08-09 ENCOUNTER — Encounter: Payer: BLUE CROSS/BLUE SHIELD | Admitting: Internal Medicine

## 2017-08-10 ENCOUNTER — Encounter (HOSPITAL_COMMUNITY): Payer: Self-pay | Admitting: Emergency Medicine

## 2017-08-10 ENCOUNTER — Emergency Department (HOSPITAL_COMMUNITY): Payer: BLUE CROSS/BLUE SHIELD

## 2017-08-10 ENCOUNTER — Emergency Department (HOSPITAL_COMMUNITY)
Admission: EM | Admit: 2017-08-10 | Discharge: 2017-08-10 | Disposition: A | Discharge status: Home / Self Care | Payer: BLUE CROSS/BLUE SHIELD | Attending: Emergency Medicine | Admitting: Emergency Medicine

## 2017-08-10 DIAGNOSIS — I10 Essential (primary) hypertension: Secondary | ICD-10-CM | POA: Insufficient documentation

## 2017-08-10 DIAGNOSIS — R69 Illness, unspecified: Secondary | ICD-10-CM

## 2017-08-10 DIAGNOSIS — J111 Influenza due to unidentified influenza virus with other respiratory manifestations: Secondary | ICD-10-CM

## 2017-08-10 DIAGNOSIS — Z79899 Other long term (current) drug therapy: Secondary | ICD-10-CM | POA: Diagnosis not present

## 2017-08-10 DIAGNOSIS — R05 Cough: Secondary | ICD-10-CM | POA: Diagnosis present

## 2017-08-10 DIAGNOSIS — F17210 Nicotine dependence, cigarettes, uncomplicated: Secondary | ICD-10-CM | POA: Insufficient documentation

## 2017-08-10 MED ORDER — IBUPROFEN 600 MG PO TABS: 600.0000 mg | ORAL_TABLET | Freq: Four times a day (QID) | ORAL | 0 refills | Status: DC | PRN

## 2017-08-10 NOTE — Discharge Instructions (Signed)
Return if any problems.

## 2017-08-10 NOTE — ED Triage Notes (Addendum)
Pt BIB EMS.  Pt states that she has had flu like sx for a week. Pt states she was dx with flu last Thursday.  Pt states that nothing is better.  No other complaints.  C/o fever, chills, vomiting (posttussive emesis), aches. Pt states that she thought she was feeling better but then went back to work today and had a coughing fit.

## 2017-08-10 NOTE — ED Provider Notes (Signed)
[facility] EMERGENCY DEPARTMENT Provider Note   CSN: [id number] Arrival date & time: 08/10/17  1146     History   Chief Complaint Chief Complaint  Patient presents with  . Influenza    HPI [name] is a 57 y.o. female.  The history is provided by the patient. No language interpreter was used.  Influenza  Presenting symptoms: cough and fever   Severity:  Moderate Onset quality:  Gradual Duration:  1 week Progression:  Worsening Chronicity:  New Relieved by:  Nothing Worsened by:  Nothing Ineffective treatments:  None tried Associated symptoms: chills   Risk factors: no diabetes problem   Pt reports she was diagnosed with influenza on the 8th.  Pt reports she still feels bad. Pt reports cough is worse  Past Medical History:  Diagnosis Date  . Anemia   . Arthritis   . GERD (gastroesophageal reflux disease)   . Hiatal hernia   . Hyperlipidemia   . Hypertension   . Plantar fasciitis     Patient Active Problem List   Diagnosis Date Noted  . 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 Right foot pain 11/23/2016  . Radicular pain of right lower extremity 07/07/2016  . H/O TIA (transient ischemic attack) and stroke 06/12/2014  . Cervical radiculitis 02/12/2014  . Healthcare maintenance 04/30/2013  . Essential hypertension, benign 01/24/2010  . Anemia 01/11/2010  . Hyperlipemia 05/28/2007  . TOBACCO ABUSE 05/28/2007  . GERD 05/28/2007    Past Surgical History:  Procedure Laterality Date  . ABDOMINAL HYSTERECTOMY  12/09   Secondary to fibroids  . CESAREAN SECTION      OB History    No data available       Home Medications    Prior to Admission medications   Medication Sig Start Date End Date Taking? Authorizing Provider  acetaminophen (TYLENOL) 325 MG tablet Take 2 tablets (650 mg total) by mouth every 6 (six) hours as needed for mild pain, moderate pain or fever. 03/17/17   Caccavale, Sophia, PA-C  amLODipine  (NORVASC) 2.5 MG tablet TAKE 1 TABLET BY MOUTH DAILY 03/19/17   Bartholomew Crews, MD  benzonatate (TESSALON) 100 MG capsule Take 1 capsule (100 mg total) by mouth every 8 (eight) hours. 08/03/17   Langston Masker B, PA-C  chlorpheniramine (CHLOR-TRIMETON) 4 MG tablet Take 1 tablet (4 mg total) by mouth every 8 (eight) hours as needed for allergies (Congestion). 04/10/17   Velna Ochs, MD  dextromethorphan-guaiFENesin [facility] DM) 30-600 MG 12hr tablet Take 1 tablet by mouth 2 (two) times daily for 7 days. 08/03/17 08/10/17  Langston Masker B, PA-C  diclofenac (VOLTAREN) 75 MG EC tablet Take 1 tablet (75 mg total) by mouth 2 (two) times daily. 02/09/17   Wallene Huh, DPM  ferrous sulfate (IRON SUPPLEMENT) 325 (65 FE) MG tablet Take 1 tablet (325 mg total) by mouth daily with breakfast. 03/01/16   Bartholomew Crews, MD  FLUARIX QUADRIVALENT 0.5 ML injection inject 0.5 milliliter intramuscularly 03/05/17   [provider]  fluticasone (FLONASE) 50 MCG/ACT nasal spray Place 1 spray into both nostrils daily. 04/26/17   Rice, Resa Miner, MD  guaiFENesin (MUCINEX) 600 MG 12 hr tablet Take 1 tablet (600 mg total) by mouth 2 (two) times daily. 05/30/17 05/30/18  Jule Ser, DO  guaiFENesin-codeine 100-10 MG/5ML syrup Take 5 mLs by mouth 3 (three) times daily as needed for cough. 08/06/17   Jule Ser, DO  ibuprofen (ADVIL,MOTRIN) 600 MG tablet Take  1 tablet (600 mg total) by mouth every 6 (six) hours as needed. 08/10/17   Fransico Meadow, PA-C  lisinopril (PRINIVIL,ZESTRIL) 20 MG tablet TAKE 1 TABLET BY MOUTH DAILY. 06/13/17   Bartholomew Crews, MD  omeprazole (PRILOSEC) 20 MG capsule TAKE 1 CAPSULE BY MOUTH ONCE DAILY 02/20/17   Bartholomew Crews, MD  ondansetron (ZOFRAN) 4 MG tablet Take 1 tablet (4 mg total) by mouth every 8 (eight) hours as needed for nausea or vomiting. 06/13/17   Wallene Huh, DPM  Potassium Chloride ER 20 MEQ TBCR Take 10 mEq by mouth daily for 3 days.  08/03/17 08/06/17  Langston Masker B, PA-C  sodium chloride (OCEAN) 0.65 % SOLN nasal spray Place 1 spray into both nostrils as needed for congestion. 04/26/17   Rice, Resa Miner, MD  traMADol (ULTRAM) 50 MG tablet Take 1 tablet (50 mg total) by mouth every 6 (six) hours as needed. 11/30/16   Bartholomew Crews, MD  traMADol (ULTRAM) 50 MG tablet Take 1 tablet (50 mg total) by mouth 3 (three) times daily. 07/16/17   Wallene Huh, DPM    Family History Family History  Problem Relation Age of Onset  . Cancer Mother 43       pancreatic cancer  . Pancreatic cancer Mother   . Cancer Father 83       throat cancer  . Cancer Maternal Aunt 60       breast cancer  . Cancer Other        Died  from Colon cancer in 52's  . Colon cancer Maternal Uncle   . Colon cancer Paternal Uncle     Social History Social History   Tobacco Use  . Smoking status: Current Every Day Smoker    Packs/day: 0.10    Types: Cigarettes  . Smokeless tobacco: Never Used  . Tobacco comment: Down to 1-2 cig/day.  Substance Use Topics  . Alcohol use: Yes    Comment: occasionally  . Drug use: No     Allergies   Aspirin and Hydromorphone hcl   Review of Systems Review of Systems  Constitutional: Positive for chills and fever.  Respiratory: Positive for cough.   All other systems reviewed and are negative.    Physical Exam Updated Vital Signs BP 117/86 (BP Location: Right Arm)   Pulse 99   Temp 98.7 F (37.1 C) (Oral)   Resp (!) 22   Ht 4\' 11"  (1.499 m)   Wt 62.6 kg (138 lb)   SpO2 96%   BMI 27.87 kg/m   Physical Exam  Constitutional: She appears well-developed and well-nourished. No distress.  HENT:  Head: Normocephalic and atraumatic.  Right Ear: External ear normal.  Left Ear: External ear normal.  Mouth/Throat: Oropharynx is clear and moist.  Eyes: Conjunctivae are normal.  Neck: Neck supple.  Cardiovascular: Normal rate and regular rhythm.  No murmur heard. Pulmonary/Chest: Effort  normal and breath sounds normal. No respiratory distress.  Abdominal: Soft. There is no tenderness.  Musculoskeletal: She exhibits no edema.  Neurological: She is alert.  Skin: Skin is warm and dry.  Psychiatric: She has a normal mood and affect.  Nursing note and vitals reviewed.   MDM:  Chest xray reviewed no pneumonia,  Pt counseled on influenza course.  I advised continue ibuprofen.  Follow up with primary care for recheck ED Treatments / Results  Labs (all labs ordered are listed, but only abnormal results are displayed) Labs Reviewed - No data to  display  EKG  EKG Interpretation None       Radiology Dg Chest 2 View  Result Date: 08/10/2017 CLINICAL DATA:  Flu like symptoms for the past week. EXAM: CHEST  2 VIEW COMPARISON:  Chest x-ray dated August 03, 2017. FINDINGS: The heart size and mediastinal contours are within normal limits. Both lungs are clear. The visualized skeletal structures are unremarkable. IMPRESSION: No active cardiopulmonary disease. Electronically Signed   By: Titus Dubin M.D.   On: 08/10/2017 13:45    Procedures Procedures (including critical care time)  Medications Ordered in ED Medications - No data to display   Initial Impression / Assessment and Plan / ED Course  I have reviewed the triage vital signs and the nursing notes.  Pertinent labs & imaging results that were available during my care of the patient were reviewed by me and considered in my medical decision making (see chart for details).       Final Clinical Impressions(s) / ED Diagnoses   Final diagnoses:  Influenza-like illness    ED Discharge Orders        Ordered    ibuprofen (ADVIL,MOTRIN) 600 MG tablet  Every 6 hours PRN     08/10/17 1451    An After Visit Summary was printed and given to the patient.    Fransico Meadow, PA-C 08/10/17 1650    Duffy Bruce, MD 08/11/17 [phone]

## 2017-08-22 MED FILL — OMEPRAZOLE 20 MG CAP: 20 | 90 days supply | Qty: 90 | Fill #2

## 2017-08-23 ENCOUNTER — Ambulatory Visit (INDEPENDENT_AMBULATORY_CARE_PROVIDER_SITE_OTHER): Payer: BLUE CROSS/BLUE SHIELD | Admitting: Podiatry

## 2017-08-23 ENCOUNTER — Encounter: Payer: Self-pay | Admitting: Podiatry

## 2017-08-23 DIAGNOSIS — M722 Plantar fascial fibromatosis: Secondary | ICD-10-CM

## 2017-08-23 MED ORDER — TRIAMCINOLONE ACETONIDE 10 MG/ML IJ SUSP
10.0000 mg | Freq: Once | INTRAMUSCULAR | Status: AC
  Administered 2017-08-23: 10 mg

## 2017-08-23 MED ORDER — TRAMADOL HCL 50 MG PO TABS: 50.0000 mg | ORAL_TABLET | Freq: Three times a day (TID) | ORAL | 2 refills | Status: DC

## 2017-08-23 MED FILL — traMADol HCL 50 MG TABS: 50 | 30 days supply | Qty: 90 | Fill #0

## 2017-09-13 ENCOUNTER — Ambulatory Visit (INDEPENDENT_AMBULATORY_CARE_PROVIDER_SITE_OTHER): Payer: BLUE CROSS/BLUE SHIELD | Admitting: Internal Medicine

## 2017-09-13 ENCOUNTER — Ambulatory Visit (HOSPITAL_COMMUNITY)
Admission: RE | Admit: 2017-09-13 | Discharge: 2017-09-13 | Disposition: A | Discharge status: Home / Self Care | Payer: BLUE CROSS/BLUE SHIELD | Source: Ambulatory Visit | Attending: Internal Medicine | Admitting: Internal Medicine

## 2017-09-13 ENCOUNTER — Other Ambulatory Visit: Payer: Self-pay

## 2017-09-13 ENCOUNTER — Encounter: Payer: Self-pay | Admitting: Internal Medicine

## 2017-09-13 ENCOUNTER — Telehealth: Payer: Self-pay

## 2017-09-13 ENCOUNTER — Encounter: Payer: Self-pay | Admitting: Physician Assistant

## 2017-09-13 VITALS — BP 124/81 | HR 90 | Temp 98.3°F

## 2017-09-13 DIAGNOSIS — K649 Unspecified hemorrhoids: Secondary | ICD-10-CM

## 2017-09-13 DIAGNOSIS — R2689 Other abnormalities of gait and mobility: Secondary | ICD-10-CM

## 2017-09-13 DIAGNOSIS — R159 Full incontinence of feces: Secondary | ICD-10-CM | POA: Insufficient documentation

## 2017-09-13 DIAGNOSIS — K642 Third degree hemorrhoids: Secondary | ICD-10-CM

## 2017-09-13 DIAGNOSIS — Z8719 Personal history of other diseases of the digestive system: Secondary | ICD-10-CM | POA: Diagnosis not present

## 2017-09-13 DIAGNOSIS — R197 Diarrhea, unspecified: Secondary | ICD-10-CM

## 2017-09-13 MED ORDER — LIDOCAINE-HYDROCORTISONE ACE 1-1 % EX CREA: 1.0000 "application " | TOPICAL_CREAM | Freq: Two times a day (BID) | CUTANEOUS | 0 refills | Status: DC

## 2017-09-13 MED ORDER — DICLOFENAC SODIUM 75 MG PO TBEC: 75.0000 mg | DELAYED_RELEASE_TABLET | Freq: Two times a day (BID) | ORAL | 2 refills | Status: DC

## 2017-09-13 NOTE — Telephone Encounter (Signed)
She may use any other the counter meds - tucks, sitz baths, etc. Make certain stool is soft and not hard, causing need for straining. If hard, OTC stool softener. If no better, make appt

## 2017-09-13 NOTE — Telephone Encounter (Signed)
Called pt - no answer; left to give [place] a call back.

## 2017-09-13 NOTE — Telephone Encounter (Signed)
Pt came to the office - c/o hemorrhoids. Clair Gulling agreed to see pt.

## 2017-09-13 NOTE — Patient Instructions (Addendum)
1. Take a fiber supplement daily 2. Try the hydrocortisone lidoderm cream twice a day for one week 3. Try sitz baths 2 or 3 times a day 4. I am getting an abdominal film and sending you to the GI doctor

## 2017-09-13 NOTE — Telephone Encounter (Signed)
Would like to know what to do with hemorrhoid. Pt states she do not need an appt to come in. Please call back.

## 2017-09-14 ENCOUNTER — Encounter: Payer: Self-pay | Admitting: Internal Medicine

## 2017-09-14 DIAGNOSIS — K529 Noninfective gastroenteritis and colitis, unspecified: Secondary | ICD-10-CM | POA: Insufficient documentation

## 2017-09-14 DIAGNOSIS — K644 Residual hemorrhoidal skin tags: Secondary | ICD-10-CM | POA: Insufficient documentation

## 2017-09-14 DIAGNOSIS — K649 Unspecified hemorrhoids: Secondary | ICD-10-CM

## 2017-09-14 NOTE — Assessment & Plan Note (Signed)
This problem is new.  She is experiencing burning and itching around her anus.  She also complains of pain all the time in the perianal region.  Her last solid bowel movement was at least a week ago if not longer.  Her stools since then have been loose and watery.  She has experienced fecal incontinence over this time.  She also feels that she has to evacuate her bowels all the time.  She has had no blood in her stools recently but about a month ago, she had an episode of dark red blood.  She also denies melena.  She feels that she has hemorrhoids but these are much worse than she has ever had hemorrhoids before.  Yesterday, she had to manually push the hemorrhoids back up inside.  In regards to her diarrhea, the most recent antibiotics per epic, was back in 2016.  She denies frequent constipation but records in the EHR indicated in September 2017 she had an abdominal film that showed a distended rectum with dense stool concerning for mild fecal impaction.  For the hemorrhoids, she has tried Preparation H without much improvement.  She was unable to work yesterday and is requesting in the day off work today due to the discomfort of the hemorrhoids.  Of note, colonoscopy 2015 showed no hemorrhoids  PLAN : Plain film to assess for stool impaction (negative) Topical steroid & lidocaine Sitz baths  Prep H OTC GI consult

## 2017-09-14 NOTE — Progress Notes (Signed)
   Subjective:    Patient ID: [name], female    DOB: 1960-11-05, 57 y.o.   MRN: [id number]  HPI  [name] is here for hemorrhoids in an acute / walk in appt. Please see the A&P for the status of the pt's chronic medical problems.  ROS : per ROS section and in problem oriented charting. All other systems are negative.  PMHx, Soc hx, and / or Fam hx : Working at a rehab facility.  Likes pts and coworkers but not admin  Review of Systems  Constitutional: Negative for unexpected weight change.  Gastrointestinal: Positive for anal bleeding, blood in stool and diarrhea.  Musculoskeletal: Positive for arthralgias and gait problem.       Objective:   Physical Exam  Constitutional: She appears well-developed and well-nourished. No distress.  HENT:  Head: Normocephalic and atraumatic.  Right Ear: External ear normal.  Left Ear: External ear normal.  Nose: Nose normal.  Eyes: Conjunctivae and EOM are normal. Right eye exhibits no discharge. Left eye exhibits no discharge.  Abdominal:  Smear stool around anus. Int hemorrhoids when bear down that then retract back in  Neurological: She is alert.  Gait nl despite L foot boot  Skin: Skin is warm and dry. She is not diaphoretic.  Psychiatric: She has a normal mood and affect. Her behavior is normal. Judgment and thought content normal.      Assessment & Plan:

## 2017-09-26 ENCOUNTER — Encounter: Payer: Self-pay | Admitting: Physician Assistant

## 2017-09-26 ENCOUNTER — Ambulatory Visit (INDEPENDENT_AMBULATORY_CARE_PROVIDER_SITE_OTHER): Payer: BLUE CROSS/BLUE SHIELD | Admitting: Physician Assistant

## 2017-09-26 VITALS — BP 124/66 | HR 72 | Ht 59.0 in | Wt 134.0 lb

## 2017-09-26 DIAGNOSIS — K625 Hemorrhage of anus and rectum: Secondary | ICD-10-CM

## 2017-09-26 DIAGNOSIS — K6289 Other specified diseases of anus and rectum: Secondary | ICD-10-CM

## 2017-09-26 DIAGNOSIS — K602 Anal fissure, unspecified: Secondary | ICD-10-CM

## 2017-09-26 DIAGNOSIS — K648 Other hemorrhoids: Secondary | ICD-10-CM

## 2017-09-26 MED ORDER — HYDROCORTISONE 2.5 % RE CREA: 1.0000 "application " | TOPICAL_CREAM | Freq: Two times a day (BID) | RECTAL | 1 refills | Status: DC

## 2017-09-26 MED ORDER — AMBULATORY NON FORMULARY MEDICATION: 1 refills | Status: DC

## 2017-09-26 NOTE — Progress Notes (Signed)
Chief Complaint: Hemorrhoids and loose stools  HPI:    [name] is a 57 year old African-American female, known to Dr. Henrene Pastor, with a past medical history as listed below, who was referred to me by Bartholomew Crews, MD for a complaint of hemorrhoids and loose stools.      08/06/17 BMP normal.  08/03/17 CBC with hemoglobin of 10.6, this is patient's baseline over the past 2 years.  Abdominal x-ray 09/13/17 with normal bowel gas pattern.  No acute abnormality.    04/10/14 colonoscopy normal.  Repeat recommended in 10 years.    Today, explains that she recently went to see her PCP about a month ago regarding hemorrhoids.  She was prescribed Hydrocortisone plus lidocaine cream which was too expensive for her.  Describes it feels like her "back end is on fire".  Describes pain with bowel movements as well as afterwards.  Has been using preparation H "extra strength" as well as Tucks pads which seem to help some.  Occasionally feels as though the hemorrhoids are going out and come back up when she has a bowel movement.  Also describes a sharp pain in her rectum.  Has seen occasional bright red blood when wiping.    Also looser than normal stools over the past month, typically occur after eating, also has days of solid stools.  No abdominal pain.    Denies fever, chills, weight loss, anorexia, nausea, vomiting, heartburn or reflux.  Past Medical History:  Diagnosis Date  . Anemia   . Arthritis   . GERD (gastroesophageal reflux disease)   . Hiatal hernia   . Hyperlipidemia   . Hypertension   . Plantar fasciitis     Past Surgical History:  Procedure Laterality Date  . ABDOMINAL HYSTERECTOMY  12/09   Secondary to fibroids  . CESAREAN SECTION      Current Outpatient Medications  Medication Sig Dispense Refill  . acetaminophen (TYLENOL) 325 MG tablet Take 2 tablets (650 mg total) by mouth every 6 (six) hours as needed for mild pain, moderate pain or fever. 30 tablet 0  . amLODipine  (NORVASC) 2.5 MG tablet TAKE 1 TABLET BY MOUTH DAILY 90 tablet 2  . ferrous sulfate (IRON SUPPLEMENT) 325 (65 FE) MG tablet Take 1 tablet (325 mg total) by mouth daily with breakfast. 30 tablet 3  . lisinopril (PRINIVIL,ZESTRIL) 20 MG tablet TAKE 1 TABLET BY MOUTH DAILY. 90 tablet 3  . omeprazole (PRILOSEC) 20 MG capsule TAKE 1 CAPSULE BY MOUTH ONCE DAILY 90 capsule 3  . traMADol (ULTRAM) 50 MG tablet Take 1 tablet (50 mg total) by mouth every 6 (six) hours as needed. 30 tablet 0  . AMBULATORY NON FORMULARY MEDICATION Medication Name: nitroglycerin 0.125 mg ointment mixed with 2% lidocaine three times a day for 6-8 weeks 1 Tube 1  . diclofenac (VOLTAREN) 75 MG EC tablet Take 1 tablet (75 mg total) by mouth 2 (two) times daily. (Patient not taking: Reported on 09/26/2017) 50 tablet 2  . hydrocortisone (ANUSOL-HC) 2.5 % rectal cream Place 1 application rectally 2 (two) times daily. 30 g 1  . Lidocaine-Hydrocortisone Ace 1-1 % CREA Apply 1 application topically 2 (two) times daily. (Patient not taking: Reported on 09/26/2017) 30 g 0  . traMADol (ULTRAM) 50 MG tablet Take 1 tablet (50 mg total) by mouth 3 (three) times daily. (Patient not taking: Reported on 09/26/2017) 90 tablet 2  . traMADol (ULTRAM) 50 MG tablet Take 1 tablet (50 mg total) by mouth 3 (three) times  daily. (Patient not taking: Reported on 09/26/2017) 90 tablet 2   No current facility-administered medications for this visit.     Allergies as of 09/26/2017 - Review Complete 09/26/2017  Allergen Reaction Noted  . Aspirin Hives   . Hydromorphone hcl Hives and Nausea And Vomiting     Family History  Problem Relation Age of Onset  . Cancer Mother 15       pancreatic cancer  . Pancreatic cancer Mother   . Cancer Father 43       throat cancer  . Cancer Maternal Aunt 60       breast cancer  . Cancer Other        Died  from Colon cancer in 67's  . Colon cancer Maternal Uncle   . Colon cancer Paternal Uncle     Social History    Socioeconomic History  . Marital status: Divorced    Spouse name: Not on file  . Number of children: Not on file  . Years of education: Not on file  . Highest education level: Not on file  Occupational History  . 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 Financial resource strain: Not on file  . Food insecurity:    Worry: Not on file    Inability: Not on file  . Transportation needs:    Medical: Not on file    Non-medical: Not on file  Tobacco Use  . Smoking status: Current Every Day Smoker    Packs/day: 0.10    Types: Cigarettes  . Smokeless tobacco: Never Used  . Tobacco comment: Down to 1-2 cig/day.  Substance and Sexual Activity  . Alcohol use: Yes    Comment: occasionally  . Drug use: No  . Sexual activity: Not on file  Lifestyle  . Physical activity:    Days per week: Not on file    Minutes per session: Not on file  . Stress: Not on file  Relationships  . Social connections:    Talks on phone: Not on file    Gets together: Not on file    Attends religious service: Not on file    Active member of club or organization: Not on file    Attends meetings of clubs or organizations: Not on file    Relationship status: Not on file  . Intimate partner violence:    Fear of current or ex partner: Not on file    Emotionally abused: Not on file    Physically abused: Not on file    Forced sexual activity: Not on file  Other Topics Concern  . Not on file  Social History Narrative   10 th grade education.    Review of Systems:    Constitutional: No weight loss, fever or chills Skin: No rash  Cardiovascular: No chest pain  Respiratory: No SOB Gastrointestinal: See HPI and otherwise negative Genitourinary: No dysuria  Neurological: No headache, dizziness or syncope Musculoskeletal: No new muscle or joint pain Hematologic: No bruising Psychiatric: No history of depression or anxiety   Physical Exam:  Vital signs: BP  124/66   Pulse 72   Ht 4\' 11"  (1.499 m)   Wt 134 lb (60.8 kg)   BMI 27.06 kg/m   Constitutional:   Pleasant AA female appears to be in NAD, Well developed, Well nourished, alert and cooperative Head:  Normocephalic and atraumatic. Eyes:   PEERL, EOMI. No icterus. Conjunctiva pink. Ears:  Normal auditory acuity.  Neck:  Supple Throat: Oral cavity and pharynx without inflammation, swelling or lesion.  Respiratory: Respirations even and unlabored. Lungs clear to auscultation bilaterally.   No wheezes, crackles, or rhonchi.  Cardiovascular: Normal S1, S2. No MRG. Regular rate and rhythm. No peripheral edema, cyanosis or pallor.  Gastrointestinal:  Soft, nondistended, nontender. No rebound or guarding. Normal bowel sounds. No appreciable masses or hepatomegaly. Rectal: External: marked ttp with spreading of anal tissue, applied lidocaine-continued to tense during exam- poorly visualized posterior anal fissure; Internal exam: not done due to tenderness Msk:  Symmetrical without gross deformities. Without edema, no deformity or joint abnormality.  Neurologic:  Alert and  oriented x4;  grossly normal neurologically.  Skin:   Dry and intact without significant lesions or rashes. Psychiatric:  Demonstrates good judgement and reason without abnormal affect or behaviors.  RELEVANT LABS AND IMAGING: CBC    Component Value Date/Time   WBC 5.5 08/03/2017 0609   RBC 4.71 08/03/2017 0609   HGB 10.6 (L) 08/03/2017 0609   HGB 10.6 (L) 03/11/2015 0925   HCT 34.2 (L) 08/03/2017 0609   HCT 35.3 03/11/2015 0925   PLT 286 08/03/2017 0609   PLT 320 03/11/2015 0925   MCV 72.6 (L) 08/03/2017 0609   MCV 72 (L) 03/11/2015 0925   MCH 22.5 (L) 08/03/2017 0609   MCHC 31.0 08/03/2017 0609   RDW 15.1 08/03/2017 0609   RDW 16.0 (H) 03/11/2015 0925   LYMPHSABS 1.1 08/03/2017 0609   MONOABS 0.4 08/03/2017 0609   EOSABS 0.1 08/03/2017 0609   BASOSABS 0.0 08/03/2017 0609    CMP     Component Value Date/Time    NA 140 08/06/2017 1129   K 4.1 08/06/2017 1129   CL 103 08/06/2017 1129   CO2 16 (L) 08/06/2017 1129   GLUCOSE 91 08/06/2017 1129   GLUCOSE 98 08/03/2017 0609   BUN 13 08/06/2017 1129   CREATININE 0.84 08/06/2017 1129   CREATININE 0.60 04/23/2014 0910   CALCIUM 9.5 08/06/2017 1129   PROT 7.2 08/03/2017 0609   PROT 6.9 03/11/2015 0925   ALBUMIN 3.7 08/03/2017 0609   ALBUMIN 4.6 03/11/2015 0925   AST 37 08/03/2017 0609   ALT 44 08/03/2017 0609   ALKPHOS 76 08/03/2017 0609   BILITOT 0.5 08/03/2017 0609   BILITOT 0.2 03/11/2015 0925   GFRNONAA 78 08/06/2017 1129   GFRNONAA >89 04/23/2014 0910   GFRAA 90 08/06/2017 1129   GFRAA >89 04/23/2014 0910   EXAM: ABDOMEN - 2 VIEW 09/13/17  COMPARISON:  03/02/2016.  FINDINGS: Normal bowel gas pattern without free peritoneal air. A small oval calcification in the right mid abdomen has not changed significantly. Unremarkable bones.  IMPRESSION: No acute abnormality.   Electronically Signed   By: Claudie Revering M.D.   On: 09/13/2017 17:29  Assessment: 1.  Internal hemorrhoids: Seen by PCP, patient also describes prolapsing of tissue at times 2.  Anal fissure: Posterior, very tender at time of exam today 3.  Rectal pain 4.  Rectal bleeding: Likely from fissure and hemorrhoids above  Plan: 1.  Prescribed Hydrocortisone ointment to be applied twice daily to a glycerin suppository for 1-2 weeks. 2.  Prescribed Nitroglycerin ointment to be applied to the rectum 3 times a day times 6-8 weeks with a gloved finger. 3.  Recommend recta care cream with lidocaine over-the-counter as needed for pain 4.  Recommend sitz baths for 15-20 minutes 2-3 times a day 5.  Recommend patient increase fiber with fiber supplement daily to try and  improve consistency of stools. 6.  Discussed with patient that if she continues with symptoms after 6-8 weeks would likely recommend repeat colonoscopy for further evaluation. 7. Pt to follow with me in 6-8  weeks or sooner if necessary.  Ellouise Newer, PA-C [facility] 09/26/2017, 1:16 PM  Cc: Bartholomew Crews, MD

## 2017-09-26 NOTE — Patient Instructions (Addendum)
We have given you a high fiber diet handout. Please strive to have 25-30 grams of fiber daily.   Your provider suggests that you use Hydrocortisone ointment to treat your symptoms. This can be applied to a glycerin suppository which can be purchased over the counter. Apply the suppository into the rectum twice daily for the next 7- 14 days.   We have sent a prescription for nitroglycerin 0.125% gel to [facility]. You should apply a pea size amount to your rectum three times daily x 6-8 weeks.  [facility]'s information is below: Address: [address], [place], [place] [address]  [phone]  *Please DO NOT go directly from our office to pick up this medication! Give the pharmacy 1 day to process the prescription as this is compounded at takes time to make.  Use Recticare with lidocaine Ointment this is an over the counter ointment that can be found at most pharmacies on the hemorrhoid aisle.   Try sitz baths 2-3 times daily for 15-20 minutes. See the information given to you today to learn how to do a sitz bath.     How to Take a Sitz Bath A sitz bath is a warm water bath that is taken while you are sitting down. The water should only come up to your hips and should cover your buttocks. Your health care provider may recommend a sitz bath to help you:  Clean the lower part of your body, including your genital area.  With itching.  With pain.  With sore muscles or muscles that tighten or spasm.  How to take a sitz bath Take 3-4 sitz baths per day or as told by your health care provider. 1. Partially fill a bathtub with warm water. You will only need the water to be deep enough to cover your hips and buttocks when you are sitting in it. 2. If your health care provider told you to put medicine in the water, follow the directions exactly. 3. Sit in the water and open the tub drain a little. 4. Turn on the warm water again to keep the tub at the correct level.  Keep the water running constantly. 5. Soak in the water for 15-20 minutes or as told by your health care provider. 6. After the sitz bath, pat the affected area dry first. Do not rub it. 7. Be careful when you stand up after the sitz bath because you may feel dizzy.  Contact a health care provider if:  Your symptoms get worse. Do not continue with sitz baths if your symptoms get worse.  You have new symptoms. Do not continue with sitz baths until you talk with your health care provider. This information is not intended to replace advice given to you by your health care provider. Make sure you discuss any questions you have with your health care provider. Document Released: 03/04/2004 Document Revised: 11/10/2015 Document Reviewed: 06/10/2014 Elsevier Interactive Patient Education  [employer].

## 2017-09-27 NOTE — Progress Notes (Signed)
Assessment and plans reviewed  

## 2017-10-10 ENCOUNTER — Other Ambulatory Visit: Payer: Self-pay | Admitting: Podiatry

## 2017-10-10 ENCOUNTER — Encounter: Payer: Self-pay | Admitting: Podiatry

## 2017-10-10 ENCOUNTER — Ambulatory Visit (INDEPENDENT_AMBULATORY_CARE_PROVIDER_SITE_OTHER): Payer: BLUE CROSS/BLUE SHIELD

## 2017-10-10 ENCOUNTER — Ambulatory Visit (INDEPENDENT_AMBULATORY_CARE_PROVIDER_SITE_OTHER): Payer: BLUE CROSS/BLUE SHIELD | Admitting: Podiatry

## 2017-10-10 DIAGNOSIS — M779 Enthesopathy, unspecified: Secondary | ICD-10-CM

## 2017-10-10 DIAGNOSIS — M722 Plantar fascial fibromatosis: Secondary | ICD-10-CM

## 2017-10-10 MED ORDER — TRIAMCINOLONE ACETONIDE 10 MG/ML IJ SUSP
10.0000 mg | Freq: Once | INTRAMUSCULAR | Status: AC
  Administered 2017-10-10: 10 mg

## 2017-10-10 NOTE — Progress Notes (Signed)
Subjective:   Patient ID: [name], female   DOB: 57 y.o.   MRN: [id number]   HPI Patient states she continues to improve with the left foot with occasional discomfort in the arch if she is on it too long and has developed a lot of pain on top of the right foot   ROS      Objective:  Physical Exam  Neurovascular status intact with tendinitis dorsal right foot with inflammation and on the left improving from plantar fasciitis with moderate discomfort in the arch     Assessment:  Plantar fasciitis improving left after endoscopic surgery with mild distal pain dorsal tendinitis right     Plan:  H&P condition reviewed and careful tenderness injection administered right 3 mg Kenalog 5 mg Xylocaine along with heat ice therapy and for the left I did apply fascial bracing I want her to use this to try to stabilize.  Reappoint as needed  X-ray indicates that there is no signs of instability or no signs of stress fracture right

## 2017-10-25 ENCOUNTER — Ambulatory Visit: Payer: BLUE CROSS/BLUE SHIELD

## 2017-10-26 MED FILL — LISINOPRIL 20 MG TABLET: 20 | 90 days supply | Qty: 90 | Fill #1

## 2017-10-26 MED FILL — AMLODIPINE BESYLATE 2.5 MG: 2.5 | 90 days supply | Qty: 90 | Fill #2

## 2017-11-13 MED FILL — OMEPRAZOLE 20 MG CAP: 20 | 90 days supply | Qty: 90 | Fill #3

## 2017-12-06 ENCOUNTER — Encounter: Payer: BLUE CROSS/BLUE SHIELD | Admitting: Internal Medicine

## 2017-12-12 ENCOUNTER — Telehealth: Payer: Self-pay | Admitting: Podiatry

## 2017-12-12 NOTE — Telephone Encounter (Signed)
I need to speak to a nurse. I had foot surgery done on my foot in December and I'm still having problems. So if somebody can please give me a call or refer me to another foot doctor. My number is [phone]. Thank you. Bye bye.

## 2017-12-12 NOTE — Telephone Encounter (Signed)
Left message for pt to make an appt with either Dr. Paulla Dolly or Dr. Jacqualyn Posey to be reevaluated and to begin ice therapy 3-4 times daily fo 15 mins/sessions protecting the skin from the ice with a light cloth, until seen in office.

## 2017-12-19 ENCOUNTER — Ambulatory Visit: Payer: BLUE CROSS/BLUE SHIELD | Admitting: Podiatry

## 2017-12-24 ENCOUNTER — Ambulatory Visit: Payer: BLUE CROSS/BLUE SHIELD | Admitting: Podiatry

## 2018-01-09 ENCOUNTER — Encounter (INDEPENDENT_AMBULATORY_CARE_PROVIDER_SITE_OTHER): Payer: Self-pay

## 2018-01-09 ENCOUNTER — Ambulatory Visit (INDEPENDENT_AMBULATORY_CARE_PROVIDER_SITE_OTHER): Payer: BLUE CROSS/BLUE SHIELD | Admitting: Internal Medicine

## 2018-01-09 ENCOUNTER — Other Ambulatory Visit: Payer: Self-pay

## 2018-01-09 VITALS — BP 136/78 | HR 73 | Temp 98.9°F | Wt 134.9 lb

## 2018-01-09 DIAGNOSIS — D649 Anemia, unspecified: Secondary | ICD-10-CM

## 2018-01-09 DIAGNOSIS — K069 Disorder of gingiva and edentulous alveolar ridge, unspecified: Secondary | ICD-10-CM

## 2018-01-09 DIAGNOSIS — R42 Dizziness and giddiness: Secondary | ICD-10-CM | POA: Diagnosis not present

## 2018-01-09 DIAGNOSIS — R631 Polydipsia: Secondary | ICD-10-CM

## 2018-01-09 DIAGNOSIS — K0889 Other specified disorders of teeth and supporting structures: Secondary | ICD-10-CM | POA: Diagnosis not present

## 2018-01-09 DIAGNOSIS — K219 Gastro-esophageal reflux disease without esophagitis: Secondary | ICD-10-CM

## 2018-01-09 DIAGNOSIS — I10 Essential (primary) hypertension: Secondary | ICD-10-CM

## 2018-01-09 DIAGNOSIS — R51 Headache: Secondary | ICD-10-CM

## 2018-01-09 LAB — GLUCOSE, CAPILLARY: GLUCOSE-CAPILLARY: 100 mg/dL — AB (ref 70–99)

## 2018-01-09 LAB — POCT GLYCOSYLATED HEMOGLOBIN (HGB A1C): Hemoglobin A1C: 5.1 % (ref 4.0–5.6)

## 2018-01-09 NOTE — Assessment & Plan Note (Addendum)
Assessment & Plan:  Patient reports occasionally feeling "wobbly" which can occur with activity or at rest (had brief dizziness and blurred vision while driving this week). Denies history of prior episodes, sensation that the room is spinning, ear fullness or pain, double vision, weakness, numbness, slurred speech or other changes in vision. States she's had good PO intake and doesn't feel dehydrated. Neurologic exam is negative for focal abnormality. She ambulates without assistance and demonstrated no balance issues.   Plan: Unsure exactly whats causing her dizziness. TSH, CBC, CMET wnl. Could be related to recent infection but this seems to have resolved. She did mention she is overdue for an eye exam and thinks her current Rx might not be correct. Recommended she also make an appointment to have her vision and eyes checked. Strict return precautions given.

## 2018-01-09 NOTE — Assessment & Plan Note (Addendum)
Assessment & Plan: Patient mentions she is overdue for screening for diabetes as she was told she had "Pre-diabetes" in the past. Continues to experience polydipsia which sometimes she feels is insatiable. It is possible that her blurred vision and feeling weak could be related to hyperglycemia and/or diabetes. Most recent A1c 5.8% in 2015. I think given her history of hyperglycemia in the setting of polydipsia and blurred vision it is worthwhile to repeat an A1c today.  -A1c pending  ADDENDUM: A1c 5.1% today. Doubt hyperglycemia contributing to current presentation

## 2018-01-09 NOTE — Progress Notes (Signed)
   CC: headache, gum drainage  HPI:  [name] is a 57 y.o. F with HTN, GERD, and anemia who presents today for evaluation of multiple complaints including headache, gum discharge and feeling wobbly.   Headache: Developed frontal headache Sunday morning which has persisted. Similar to prior headaches but dont usually last this long. Described pain as dull and located centrally on her forehead just above her brows. It is not particularly bothersome but wanted to be seen in the setting of her other complaints. Has not tried any OTC medications as she wanted to speak to a doctor first but did think maybe a sinus medicine would help. She denies nasal/sinus congestion, rhinorrhea, cough, sneezing, post-nasal drip, ear pain or fullness, fevers, chills, nausea, vomiting or myalgia. Does admit to sore throat 1-week ago which has resolved and has several sick contacts at home.   Gum discharge: She also mentions drainage from her left-upper gum about 1-week ago. Noticed some fullness in the area for 3-4 days and drained during a peroxide gargle. Described a foul taste and yellow milky discharge which resolved quickly and has not returned. Admits to only mild discomfort with this and denied recent tooth break or dental issue, although does admit its been "forever" since her last dentist appointment. She has some mild discomfort but has no odynophagia, foul taste, residual fullness, or history of oral abscesses.  Feeling "Wobbly": In addition to the above, she also describes feeling "wobbly" intermittently since Monday. First noticed this after getting out of bed that morning and did not resolve after going back to sleep. Since then she's had a "handfull" of other episodes including while she was driving the other day and felt that her vision was blurry. Denies sensation of room spinning, falls, LOC or poor PO intake. No confusion or urinary incontinence  Past Medical History:  Diagnosis Date  . Anemia    . Arthritis   . GERD (gastroesophageal reflux disease)   . Hiatal hernia   . Hyperlipidemia   . Hypertension   . Plantar fasciitis    Review of Systems:  As noted above  Physical Exam: General: Well-appearing african [place] female in no acute distress. Present and conversant.  HEENT: No icterus, injection or ptosis. No hoarseness or dysarthria. Oropharynx with moist mucous membranes and poor dentition. Tonsils without erythema or exudate. No palpable fullness or lesion at reported site of abscess. No drainage. Prominent eyes, almost bulging. No maxillary or frontal sinus tenderness to palpation.  Cardiac: RRR, no MGR appreciated Pulmonary: CTA BL with normal WOB on RA. Able to speak in complete sentences Abd: Soft, non-tender. +bs Extremities: Warm, perfused. No significant pedal edema. No rash. Neuro: EOMI, PERRL. Facial muscles symmetric, tongue protrudes midline and uvula does not deviate. Strength 5/5 BL UE and LE. Sensation grossly intact. Reflexes 2+ BL biceps, triceps and patellar. Normal gait, ambulates without assistance. Normal finger-to-nose, heel-to-shin and rapid alternating movements.  Vitals:   01/09/18 1018  BP: 136/78  Pulse: 73  Temp: 98.9 F (37.2 C)  TempSrc: Oral  SpO2: 100%  Weight: 134 lb 14.4 oz (61.2 kg)   Body mass index is 27.25 kg/m.  Assessment & Plan:   See Encounters Tab for problem based charting.  Patient discussed with Dr. Evette Doffing

## 2018-01-09 NOTE — Patient Instructions (Addendum)
It was nice meeting you today. I'm sorry you arent feeling your best.   Today we talked about several topics: #1) Headache: I suspect your headache is related to a sinus issue or even a viral syndrome. Please use conservative treatment with Tylenol, Ibuprofen or decongestants (pleased avoid pseudophedrine). #2) Feeling that you might pass out: This could be related to the sinus issue or the recent drainage from your gums. You dont have a fever or other sign of infection right now and your symptoms dont sound consistent with a neurologic issue. Please be sure to keep yourself well hydrated and rest until symptoms pass.  #3) Increased thirst: I am checking you for diabetes #4) Drainage from gums: Please make an appointment to see a dentist for further evaluation.   Please make an appointment at the front desk to be seen at Dr. Phylliss Bob earliest available appointment. It looks like she has an appointment available 8/22.

## 2018-01-09 NOTE — Assessment & Plan Note (Signed)
Assessment: Patient with what sounds to be purulent discharge from a small nearly painless bump on left upper gum. Exam with minimal erythema in the area and there was no palpable fullness, fluctuance and no purulence could be expressed. The area was not tender to palpation and she had no tenderness of her maxillary or frontal sinuses. She also denies any fevers or chills.   Plan: Lesion seems to have drained spontaneously and no signs of reoccurance or active infection on exam. She reports she will continue with peroxide gargles although did recommend that if she were to continue, she should use a dilute solution. She was advised to see her dentist which she was agreeable to. Patient given strict return precautions.

## 2018-01-10 LAB — CBC WITH DIFFERENTIAL/PLATELET
BASOS: 0 %
Basophils Absolute: 0 10*3/uL (ref 0.0–0.2)
EOS (ABSOLUTE): 0.2 10*3/uL (ref 0.0–0.4)
EOS: 3 %
HEMATOCRIT: 36 % (ref 34.0–46.6)
Hemoglobin: 11 g/dL — ABNORMAL LOW (ref 11.1–15.9)
IMMATURE GRANULOCYTES: 0 %
Immature Grans (Abs): 0 10*3/uL (ref 0.0–0.1)
LYMPHS ABS: 1.9 10*3/uL (ref 0.7–3.1)
Lymphs: 37 %
MCH: 22.7 pg — ABNORMAL LOW (ref 26.6–33.0)
MCHC: 30.6 g/dL — ABNORMAL LOW (ref 31.5–35.7)
MCV: 74 fL — AB (ref 79–97)
MONOCYTES: 8 %
MONOS ABS: 0.4 10*3/uL (ref 0.1–0.9)
NEUTROS PCT: 52 %
Neutrophils Absolute: 2.7 10*3/uL (ref 1.4–7.0)
Platelets: 338 10*3/uL (ref 150–450)
RBC: 4.84 x10E6/uL (ref 3.77–5.28)
RDW: 16.1 % — AB (ref 12.3–15.4)
WBC: 5.1 10*3/uL (ref 3.4–10.8)

## 2018-01-10 LAB — CMP14 + ANION GAP
ALBUMIN: 4.3 g/dL (ref 3.5–5.5)
ALT: 43 IU/L — AB (ref 0–32)
ANION GAP: 17 mmol/L (ref 10.0–18.0)
AST: 27 IU/L (ref 0–40)
Albumin/Globulin Ratio: 1.5 (ref 1.2–2.2)
Alkaline Phosphatase: 87 IU/L (ref 39–117)
BUN / CREAT RATIO: 19 (ref 9–23)
BUN: 14 mg/dL (ref 6–24)
Bilirubin Total: 0.3 mg/dL (ref 0.0–1.2)
CO2: 24 mmol/L (ref 20–29)
CREATININE: 0.73 mg/dL (ref 0.57–1.00)
Calcium: 9.9 mg/dL (ref 8.7–10.2)
Chloride: 101 mmol/L (ref 96–106)
GFR calc Af Amer: 106 mL/min/{1.73_m2} (ref >59)
GFR, EST NON AFRICAN AMERICAN: 92 mL/min/{1.73_m2} (ref >59)
GLOBULIN, TOTAL: 2.8 g/dL (ref 1.5–4.5)
Glucose: 86 mg/dL (ref 65–99)
Potassium: 4 mmol/L (ref 3.5–5.2)
SODIUM: 142 mmol/L (ref 134–144)
Total Protein: 7.1 g/dL (ref 6.0–8.5)

## 2018-01-10 LAB — TSH: TSH: 0.989 u[IU]/mL (ref 0.450–4.500)

## 2018-01-11 ENCOUNTER — Encounter: Payer: Self-pay | Admitting: Internal Medicine

## 2018-01-11 NOTE — Progress Notes (Signed)
Internal Medicine Clinic Attending  Case discussed with Dr. Molt at the time of the visit.  We reviewed the resident's history and exam and pertinent patient test results.  I agree with the assessment, diagnosis, and plan of care documented in the resident's note. 

## 2018-01-24 ENCOUNTER — Other Ambulatory Visit: Payer: Self-pay | Admitting: Internal Medicine

## 2018-01-24 ENCOUNTER — Encounter (HOSPITAL_COMMUNITY): Payer: Self-pay | Admitting: Emergency Medicine

## 2018-01-24 ENCOUNTER — Other Ambulatory Visit: Payer: Self-pay

## 2018-01-24 ENCOUNTER — Emergency Department (HOSPITAL_COMMUNITY)
Admission: EM | Admit: 2018-01-24 | Discharge: 2018-01-24 | Disposition: A | Discharge status: Home / Self Care | Payer: BLUE CROSS/BLUE SHIELD | Attending: Emergency Medicine | Admitting: Emergency Medicine

## 2018-01-24 DIAGNOSIS — I10 Essential (primary) hypertension: Secondary | ICD-10-CM | POA: Insufficient documentation

## 2018-01-24 DIAGNOSIS — M79671 Pain in right foot: Secondary | ICD-10-CM | POA: Insufficient documentation

## 2018-01-24 DIAGNOSIS — M79672 Pain in left foot: Secondary | ICD-10-CM | POA: Diagnosis not present

## 2018-01-24 DIAGNOSIS — F17210 Nicotine dependence, cigarettes, uncomplicated: Secondary | ICD-10-CM | POA: Diagnosis not present

## 2018-01-24 DIAGNOSIS — Z79899 Other long term (current) drug therapy: Secondary | ICD-10-CM | POA: Diagnosis not present

## 2018-01-24 MED ORDER — MELOXICAM 7.5 MG PO TABS: 7.5000 mg | ORAL_TABLET | Freq: Every day | ORAL | 0 refills | Status: AC

## 2018-01-24 MED FILL — LISINOPRIL 20 MG TABLET: 20 | 90 days supply | Qty: 90 | Fill #2

## 2018-01-24 MED FILL — MELOXICAM 7.5 MG TABLET: 7.5 | 7 days supply | Qty: 7 | Fill #0

## 2018-01-24 NOTE — ED Provider Notes (Signed)
[facility] Provider Note   CSN: [id number] Arrival date & time: 01/24/18  2355     History   Chief Complaint Chief Complaint  Patient presents with  . Foot Pain    HPI [name] is a 57 y.o. female.  57 year old female presents with complaint of pain in both of her feet.  Patient states that she had a heel spur treated surgically by her podiatrist in December, has had pain since that time, no changes in the chronic pain to her left foot.  Patient states that she has had to go back to wearing her boot on this foot due to the pain in her foot and now has pain to the top of her right foot.  Denies any falls or injuries to either foot.  Patient works as a Secretary/administrator, states pain is worse after working all day and she has some improvement of her pain if she ices and elevates.  Patient is taking Ultram without relief of her pain.  No other complaints or concerns.     Past Medical History:  Diagnosis Date  . Anemia   . Arthritis   . GERD (gastroesophageal reflux disease)   . Hiatal hernia   . Hyperlipidemia   . Hypertension   . Plantar fasciitis     Patient Active Problem List   Diagnosis Date Noted  . Polydipsia 01/09/2018  . Gum disease 01/09/2018  . Dizziness 01/09/2018  . Hemorrhoids 09/14/2017  . 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 Radicular pain of right lower extremity 07/07/2016  . H/O TIA (transient ischemic attack) and stroke 06/12/2014  . Cervical radiculitis 02/12/2014  . Healthcare maintenance 04/30/2013  . Essential hypertension, benign 01/24/2010  . Anemia 01/11/2010  . Hyperlipemia 05/28/2007  . TOBACCO ABUSE 05/28/2007  . GERD 05/28/2007    Past Surgical History:  Procedure Laterality Date  . ABDOMINAL HYSTERECTOMY  12/09   Secondary to fibroids  . CESAREAN SECTION       OB History   None      Home Medications    Prior to Admission medications   Medication Sig Start Date End  Date Taking? Authorizing Provider  acetaminophen (TYLENOL) 325 MG tablet Take 2 tablets (650 mg total) by mouth every 6 (six) hours as needed for mild pain, moderate pain or fever. 03/17/17   Caccavale, Sophia, PA-C  AMBULATORY NON FORMULARY MEDICATION Medication Name: nitroglycerin 0.125 mg ointment mixed with 2% lidocaine three times a day for 6-8 weeks 09/26/17   Levin Erp, PA  amLODipine (NORVASC) 2.5 MG tablet TAKE 1 TABLET BY MOUTH DAILY 03/19/17   Bartholomew Crews, MD  ferrous sulfate (IRON SUPPLEMENT) 325 (65 FE) MG tablet Take 1 tablet (325 mg total) by mouth daily with breakfast. 03/01/16   Bartholomew Crews, MD  hydrocortisone (ANUSOL-HC) 2.5 % rectal cream Place 1 application rectally 2 (two) times daily. 09/26/17   Levin Erp, PA  lisinopril (PRINIVIL,ZESTRIL) 20 MG tablet TAKE 1 TABLET BY MOUTH DAILY. 06/13/17   Bartholomew Crews, MD  meloxicam (MOBIC) 7.5 MG tablet Take 1 tablet (7.5 mg total) by mouth daily for 7 days. 01/24/18 01/31/18  Tacy Learn, PA-C  omeprazole (PRILOSEC) 20 MG capsule TAKE 1 CAPSULE BY MOUTH ONCE DAILY 02/20/17   Bartholomew Crews, MD  traMADol (ULTRAM) 50 MG tablet Take 1 tablet (50 mg total) by mouth every 6 (six) hours as needed. 11/30/16   Bartholomew Crews, MD  Family History Family History  Problem Relation Age of Onset  . Cancer Mother 11       pancreatic cancer  . Pancreatic cancer Mother   . Cancer Father 35       throat cancer  . Cancer Maternal Aunt 60       breast cancer  . Cancer Other        Died  from Colon cancer in 70's  . Colon cancer Maternal Uncle   . Colon cancer Paternal Uncle     Social History Social History   Tobacco Use  . Smoking status: Current Every Day Smoker    Packs/day: 0.10    Types: Cigarettes  . Smokeless tobacco: Never Used  . Tobacco comment: Down to 1-2 cig/day.  Substance Use Topics  . Alcohol use: Yes    Comment: occasionally  . Drug use: No     Allergies     Aspirin and Hydromorphone hcl   Review of Systems Review of Systems  Constitutional: Negative for fever.  Musculoskeletal: Positive for arthralgias, gait problem, joint swelling and myalgias. Negative for back pain.  Skin: Negative for color change, pallor, rash and wound.  Allergic/Immunologic: Negative for immunocompromised state.  Neurological: Negative for weakness and numbness.  Hematological: Does not bruise/bleed easily.  Psychiatric/Behavioral: Negative for confusion.  All other systems reviewed and are negative.    Physical Exam Updated Vital Signs BP (!) 169/99 (BP Location: Right Arm)   Pulse 78   Temp 98 F (36.7 C) (Oral)   Resp 16   SpO2 100%   Physical Exam  Constitutional: She is oriented to person, place, and time. She appears well-developed and well-nourished. No distress.  HENT:  Head: Normocephalic and atraumatic.  Cardiovascular: Intact distal pulses.  Pulmonary/Chest: Effort normal.  Musculoskeletal: She exhibits tenderness. She exhibits no edema or deformity.       Right foot: There is tenderness and swelling. There is normal range of motion, normal capillary refill, no crepitus, no deformity and no laceration.       Left foot: Normal. There is normal range of motion, no tenderness, no bony tenderness, no swelling, normal capillary refill, no crepitus, no deformity and no laceration.       Feet:  Neurological: She is alert and oriented to person, place, and time. No sensory deficit.  Skin: Skin is warm and dry. Capillary refill takes less than 2 seconds. She is not diaphoretic. No erythema. No pallor.  Psychiatric: She has a normal mood and affect. Her behavior is normal.  Nursing note and vitals reviewed.    ED Treatments / Results  Labs (all labs ordered are listed, but only abnormal results are displayed) Labs Reviewed - No data to display  EKG None  Radiology No results found.  Procedures Procedures (including critical care  time)  Medications Ordered in ED Medications - No data to display   Initial Impression / Assessment and Plan / ED Course  I have reviewed the triage vital signs and the nursing notes.  Pertinent labs & imaging results that were available during my care of the patient were reviewed by me and considered in my medical decision making (see chart for details).  Clinical Course as of Jan 24 999  Thu Jan 24, 5741  3348 57 year old female with bilateral foot pain.  Patient states left foot pain since bone spur surgery in December, states that she has pain in her right foot from walking differently to accommodate for left foot pain.  Patient  is taking Ultram without relief, states she does get some relief with icing and elevating her left foot.  Patient is scheduled to see her podiatrist on Wednesday next week, requesting something for pain and a work note.  Patient was given a short course of low-dose meloxicam and advised to see PCP or podiatry.  Given work note as requested.  Also recommend she wear a more supportive shoe.   [LM]    Clinical Course User Index [LM] Tacy Learn, PA-C    Final Clinical Impressions(s) / ED Diagnoses   Final diagnoses:  Pain in both feet    ED Discharge Orders        Ordered    meloxicam (MOBIC) 7.5 MG tablet  Daily     01/24/18 0951       Tacy Learn, PA-C 01/24/18 1000    Julianne Rice, MD 01/26/18 1556

## 2018-01-24 NOTE — Discharge Instructions (Addendum)
Take meloxicam daily as prescribed, take with food. Continue with ice/elevate. Follow up with your podiatrist as scheduled.

## 2018-01-24 NOTE — ED Triage Notes (Signed)
Pt had heel spur removed in Dec and is still having pain with walking. Pt states she is unable to get into foot doctor for a few weeks.

## 2018-01-25 ENCOUNTER — Other Ambulatory Visit: Payer: Self-pay | Admitting: Internal Medicine

## 2018-01-25 MED ORDER — LISINOPRIL 20 MG PO TABS: 20.0000 mg | ORAL_TABLET | Freq: Every day | ORAL | 1 refills | Status: DC

## 2018-01-25 MED ORDER — AMLODIPINE BESYLATE 2.5 MG PO TABS: 2.5000 mg | ORAL_TABLET | Freq: Every day | ORAL | 1 refills | Status: DC

## 2018-01-25 MED FILL — AMLODIPINE 2.5 MG TABLET: 2.5 | 90 days supply | Qty: 90 | Fill #0

## 2018-01-25 NOTE — Addendum Note (Signed)
Addended by: Aldine Contes on: 01/25/2018 11:00 AM   Modules accepted: Orders

## 2018-01-25 NOTE — Telephone Encounter (Signed)
Needs refill on meloxicam 7.5mg  tablet @ [facility], asked pharmacy- pharmacy waiting on physician to approve it. Pt contact # [phone]

## 2018-01-25 NOTE — Telephone Encounter (Signed)
PT NEEDS REFILL ON BP MEDICATION, TO [facility]

## 2018-01-25 NOTE — Telephone Encounter (Signed)
Next appt scheduled 8/22 with PCP. 

## 2018-01-25 NOTE — Addendum Note (Signed)
Addended by: Gaylyn Rong on: 01/25/2018 09:34 AM   Modules accepted: Orders

## 2018-01-28 MED FILL — OMEPRAZOLE 20 MG CAP: 20 | 90 days supply | Qty: 90 | Fill #0

## 2018-01-30 ENCOUNTER — Ambulatory Visit: Payer: BLUE CROSS/BLUE SHIELD | Admitting: Podiatry

## 2018-02-01 ENCOUNTER — Ambulatory Visit: Payer: BLUE CROSS/BLUE SHIELD | Admitting: Podiatry

## 2018-02-06 ENCOUNTER — Encounter: Payer: Self-pay | Admitting: Podiatry

## 2018-02-06 ENCOUNTER — Ambulatory Visit (INDEPENDENT_AMBULATORY_CARE_PROVIDER_SITE_OTHER): Payer: BLUE CROSS/BLUE SHIELD | Admitting: Podiatry

## 2018-02-06 DIAGNOSIS — M7752 Other enthesopathy of left foot: Secondary | ICD-10-CM

## 2018-02-06 DIAGNOSIS — M779 Enthesopathy, unspecified: Secondary | ICD-10-CM

## 2018-02-06 MED ORDER — TRAMADOL HCL 50 MG PO TABS: 50.0000 mg | ORAL_TABLET | Freq: Three times a day (TID) | ORAL | 2 refills | Status: DC

## 2018-02-06 MED ORDER — TRIAMCINOLONE ACETONIDE 10 MG/ML IJ SUSP
10.0000 mg | Freq: Once | INTRAMUSCULAR | Status: AC
  Administered 2018-02-06: 10 mg

## 2018-02-06 MED FILL — traMADol HCL 50 MG TABS: 50 | 30 days supply | Qty: 90 | Fill #0

## 2018-02-06 NOTE — Progress Notes (Signed)
Subjective:   Patient ID: [name], female   DOB: 57 y.o.   MRN: [id number]   HPI Patient states her heel is doing really well but she is having pain in the outside of her left foot that still bothers her and she has to work on cement floors and she knows this is a big part of the problem   ROS      Objective:  Physical Exam  Neurovascular status intact negative Homans sign was noted with patient's left lateral foot base of fifth metatarsal inflamed with proximal no indications of pathology currently     Assessment:  Hopeful peroneal tendinitis creating problem with excellent healing from plantar fascial surgery left     Plan:  Advised on the importance of ice and good shoes and today I did do a lateral sheath injection 3 mg Kenalog 5 mg Xylocaine and advised on stretching exercises and placed on tramadol 50 mg 3 times daily to take as needed.  Patient will be checked back again in the next several weeks

## 2018-02-14 ENCOUNTER — Encounter: Payer: Self-pay | Admitting: Internal Medicine

## 2018-02-14 ENCOUNTER — Ambulatory Visit: Payer: BLUE CROSS/BLUE SHIELD | Admitting: Internal Medicine

## 2018-03-06 ENCOUNTER — Ambulatory Visit: Payer: BLUE CROSS/BLUE SHIELD | Admitting: Podiatry

## 2018-03-07 ENCOUNTER — Ambulatory Visit: Payer: BLUE CROSS/BLUE SHIELD | Admitting: Podiatry

## 2018-04-02 ENCOUNTER — Other Ambulatory Visit: Payer: Self-pay

## 2018-04-02 ENCOUNTER — Ambulatory Visit (INDEPENDENT_AMBULATORY_CARE_PROVIDER_SITE_OTHER): Payer: BLUE CROSS/BLUE SHIELD | Admitting: Internal Medicine

## 2018-04-02 DIAGNOSIS — R51 Headache: Secondary | ICD-10-CM

## 2018-04-02 DIAGNOSIS — R519 Headache, unspecified: Secondary | ICD-10-CM | POA: Insufficient documentation

## 2018-04-02 DIAGNOSIS — Z23 Encounter for immunization: Secondary | ICD-10-CM | POA: Diagnosis not present

## 2018-04-02 MED ORDER — GABAPENTIN 100 MG PO CAPS: 100.0000 mg | ORAL_CAPSULE | Freq: Two times a day (BID) | ORAL | 0 refills | Status: DC

## 2018-04-02 MED FILL — GABAPENTIN 100 MG CAP: 100 | 30 days supply | Qty: 60 | Fill #0

## 2018-04-02 NOTE — Progress Notes (Signed)
   CC: Headache  HPI:  [name] is a 57 y.o. female with past medical history outlined below here for headache. For the details of today's visit, please refer to the assessment and plan.  Past Medical History:  Diagnosis Date  . Anemia   . Arthritis   . GERD (gastroesophageal reflux disease)   . Hiatal hernia   . Hyperlipidemia   . Hypertension   . Plantar fasciitis     ROS  Physical Exam:  Vitals:   04/02/18 1505  BP: 120/71  Pulse: 87  Temp: 98.8 F (37.1 C)  TempSrc: Oral  SpO2: 100%  Weight: 137 lb (62.1 kg)    Constitutional: NAD, appears comfortable Cardiovascular: RRR, no murmurs, rubs, or gallops.  Pulmonary/Chest: CTAB, no wheezes, rales, or rhonchi.  Neurological: A&Ox3, CN II - XII grossly intact. PERRL, EOMI, strength and sensation intact bilaterally. No focal deficits. Tenderness to light palpation over right temporal artery. Skin: No rashes or erythema  Psychiatric: Normal mood and affect  Assessment & Plan:   See Encounters Tab for problem based charting.  Patient discussed with Dr. Evette Doffing

## 2018-04-02 NOTE — Assessment & Plan Note (Addendum)
Patient is here with new onset acute headache x1 week.  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  Patient initially felt that headache was triggered by increased stress, however headache is constant and has persisted despite treatment with Tylenol and NSAIDs.  She denies prior issues with headaches.  This is a new problem for her.  She describes a right periorbital and temporal headache that is sharp and constant.  She does have episodes where her pain intensifies that she describes as shock like. She denies visual changes.  No nausea, no vomiting, no light sensitivity. She denies pericranial muscle tenderness. She has had difficulty sleeping due to the pain and has required assistance with her job at work. She is neurologically intact on exam, however has significant tenderness to light palpation over her right temple and forehead. There is no evidence of rash or skin changes to suggest shingles. Given the characteristic of pain - will work up for GCA. Etiology could also be a trigeminal neuralgia given the location. Will do a trial of low dose gabapentin for pain relief.  -- SED rate, CRP -- No imaging at this time  -- Gabapentin 100 mg BID  -- Follow up 1 week   ADDENDUM: SED rate is elevated at 66. CRP within normal range. Called patient with results, discussed concern for giant cell arteritis and need for biopsy. Sent prescription for prednisone 60 mg daily to her pharmacy. Will ask patient to follow up in 2 weeks, recommend taper at that time if improved. Urgent referral to vascular surgery placed.

## 2018-04-02 NOTE — Patient Instructions (Signed)
Ms. Tift,  It was a pleasure to meet you. I am sorry to hear about your headache. I will call you with the results of your blood work tomorrow. I have sent a prescription for a medication to your pharmacy, you may take this twice a day. Please follow up with [place] in 1 week. If you have any questions or concerns, call our clinic at [phone] or after hours call [phone] and ask for the internal medicine resident on call. Thank you!  Dr. Philipp Ovens

## 2018-04-03 ENCOUNTER — Telehealth: Payer: Self-pay | Admitting: Internal Medicine

## 2018-04-03 LAB — SEDIMENTATION RATE: SED RATE: 66 mm/h — AB (ref 0–40)

## 2018-04-03 LAB — C-REACTIVE PROTEIN: CRP: 5 mg/L (ref 0–10)

## 2018-04-03 MED ORDER — PREDNISONE 20 MG PO TABS: 60.0000 mg | ORAL_TABLET | Freq: Every day | ORAL | 0 refills | Status: DC

## 2018-04-03 MED FILL — predniSONE 20 MG TABS: 20 | 30 days supply | Qty: 90 | Fill #0

## 2018-04-03 NOTE — Progress Notes (Signed)
Internal Medicine Clinic Attending  Case discussed with Dr. Philipp Ovens  at the time of the visit.  We reviewed the resident's history and exam and pertinent patient test results.  I agree with the assessment, diagnosis, and plan of care documented in the resident's note.  New onset of persistent headache in an older patient. ESR is elevated to 66, consistent with at least intermediate risk for giant cell arteritis. Would start treatment with Prednisone, and refer to surgery for temporal artery biopsy. We will push to have this biopsy prioritized to improve the diagnostic yield.

## 2018-04-03 NOTE — Addendum Note (Signed)
Addended by: Jodean Lima on: 04/03/2018 01:05 PM   Modules accepted: Orders

## 2018-04-03 NOTE — Telephone Encounter (Signed)
Pt forget to get a doctor note yesterday for work, she also would like to know if the note can excuse her from work today she is still having a headache. Pt contact [phone]

## 2018-04-03 NOTE — Telephone Encounter (Signed)
Work note is done. I have asked her to remain out of work until cleared by a physician. I will call her shortly with her lab results.

## 2018-04-08 ENCOUNTER — Telehealth: Payer: Self-pay | Admitting: Internal Medicine

## 2018-04-08 NOTE — Telephone Encounter (Signed)
Called patient to follow up. She continues to have severe unilateral headache, but feels it may be slightly improved with prednisone. She denies visual loss or visual changes. Has appointment scheduled to see the vascular surgeon tomorrow morning. Will follow up recommendations and hopefully plan for temporal artery biopsy for GCA evaluation.   Velna Ochs, M.D. - PGY3 04/08/2018, 10:20 AM

## 2018-04-09 ENCOUNTER — Encounter: Payer: Self-pay | Admitting: Vascular Surgery

## 2018-04-09 ENCOUNTER — Ambulatory Visit (INDEPENDENT_AMBULATORY_CARE_PROVIDER_SITE_OTHER): Payer: BLUE CROSS/BLUE SHIELD | Admitting: Vascular Surgery

## 2018-04-09 ENCOUNTER — Encounter: Payer: Self-pay | Admitting: *Deleted

## 2018-04-09 ENCOUNTER — Other Ambulatory Visit: Payer: Self-pay

## 2018-04-09 ENCOUNTER — Encounter (HOSPITAL_COMMUNITY): Payer: Self-pay | Admitting: *Deleted

## 2018-04-09 ENCOUNTER — Other Ambulatory Visit: Payer: Self-pay | Admitting: *Deleted

## 2018-04-09 VITALS — BP 183/108 | HR 84 | Resp 18 | Ht 59.0 in | Wt 136.2 lb

## 2018-04-09 DIAGNOSIS — G44201 Tension-type headache, unspecified, intractable: Secondary | ICD-10-CM | POA: Diagnosis not present

## 2018-04-09 NOTE — H&P (View-Only) (Signed)
Vascular and Vein Specialist of [place]  Patient name: [name] MRN: [id number] DOB: 1961-05-16 Sex: female  REASON FOR CONSULT: Evaluation for temporal artery biopsy  HPI: [name] is a 57 y.o. female, who is here today for evaluation of temporal artery biopsy.  She is a very pleasant 57 year old who reports one half 2 weeks ago sudden onset of severe right-sided headache.  She has had this continuously with no relief.  She was given steroids and is had no difference from this.  She did report that she had some tenderness over her right temporal area but this has resolved.  Is light sensitive as well.  Had not had a prior history of headache.  No history of cardiac disease and no history of peripheral vascular occlusive disease  Past Medical History:  Diagnosis Date  . Anemia   . Arthritis   . GERD (gastroesophageal reflux disease)   . Hiatal hernia   . Hyperlipidemia   . Hypertension   . Plantar fasciitis     Family History  Problem Relation Age of Onset  . Cancer Mother 6       pancreatic cancer  . Pancreatic cancer Mother   . Heart disease Mother   . Cancer Father 46       throat cancer  . Cancer Maternal Aunt 60       breast cancer  . Cancer Other        Died  from Colon cancer in 67's  . Colon cancer Maternal Uncle   . Colon cancer Paternal Uncle   . Heart disease Sister     SOCIAL HISTORY: Social History   Socioeconomic History  . Marital status: Divorced    Spouse name: Not on file  . Number of children: Not on file  . Years of education: Not on file  . Highest education level: Not on file  Occupational History  . 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 Financial resource strain: Not on file  . Food insecurity:    Worry: Not on file    Inability: Not on file  . Transportation needs:    Medical: Not on file    Non-medical: Not on file  Tobacco Use  .  Smoking status: Current Every Day Smoker    Packs/day: 0.10    Types: Cigarettes  . Smokeless tobacco: Never Used  . Tobacco comment: Down to 1-2 cig/day.  Substance and Sexual Activity  . Alcohol use: Yes    Comment: occasionally  . Drug use: No  . Sexual activity: Not on file  Lifestyle  . Physical activity:    Days per week: Not on file    Minutes per session: Not on file  . Stress: Not on file  Relationships  . Social connections:    Talks on phone: Not on file    Gets together: Not on file    Attends religious service: Not on file    Active member of club or organization: Not on file    Attends meetings of clubs or organizations: Not on file    Relationship status: Not on file  . Intimate partner violence:    Fear of current or ex partner: Not on file    Emotionally abused: Not on file    Physically abused: Not on file    Forced sexual activity: Not on file  Other Topics Concern  . Not on file  Social  History Narrative   10 th grade education.    Allergies  Allergen Reactions  . Aspirin Hives  . Hydromorphone Hcl Hives and Nausea And Vomiting    Current Outpatient Medications  Medication Sig Dispense Refill  . acetaminophen (TYLENOL) 325 MG tablet Take 2 tablets (650 mg total) by mouth every 6 (six) hours as needed for mild pain, moderate pain or fever. 30 tablet 0  . AMBULATORY NON FORMULARY MEDICATION Medication Name: nitroglycerin 0.125 mg ointment mixed with 2% lidocaine three times a day for 6-8 weeks 1 Tube 1  . amLODipine (NORVASC) 2.5 MG tablet Take 1 tablet (2.5 mg total) by mouth daily. 90 tablet 1  . ferrous sulfate (IRON SUPPLEMENT) 325 (65 FE) MG tablet Take 1 tablet (325 mg total) by mouth daily with breakfast. 30 tablet 3  . gabapentin (NEURONTIN) 100 MG capsule Take 1 capsule (100 mg total) by mouth 2 (two) times daily. 60 capsule 0  . hydrocortisone (ANUSOL-HC) 2.5 % rectal cream Place 1 application rectally 2 (two) times daily. 30 g 1  .  lisinopril (PRINIVIL,ZESTRIL) 20 MG tablet Take 1 tablet (20 mg total) by mouth daily. 90 tablet 1  . omeprazole (PRILOSEC) 20 MG capsule TAKE 1 CAPSULE BY MOUTH ONCE DAILY 90 capsule 3  . predniSONE (DELTASONE) 20 MG tablet Take 3 tablets (60 mg total) by mouth daily with breakfast. 90 tablet 0  . traMADol (ULTRAM) 50 MG tablet Take 1 tablet (50 mg total) by mouth every 6 (six) hours as needed. 30 tablet 0  . traMADol (ULTRAM) 50 MG tablet Take 1 tablet (50 mg total) by mouth 3 (three) times daily. 90 tablet 2   No current facility-administered medications for this visit.     REVIEW OF SYSTEMS:  [X]  denotes positive finding, [ ]  denotes negative finding Cardiac  Comments:  Chest pain or chest pressure:    Shortness of breath upon exertion:    Short of breath when lying flat:    Irregular heart rhythm:        Vascular    Pain in calf, thigh, or hip brought on by ambulation:    Pain in feet at night that wakes you up from your sleep:     Blood clot in your veins:    Leg swelling:         Pulmonary    Oxygen at home:    Productive cough:     Wheezing:         Neurologic    Sudden weakness in arms or legs:     Sudden numbness in arms or legs:     Sudden onset of difficulty speaking or slurred speech:    Temporary loss of vision in one eye:     Problems with dizziness:         Gastrointestinal    Blood in stool:     Vomited blood:         Genitourinary    Burning when urinating:     Blood in urine:        Psychiatric    Major depression:         Hematologic    Bleeding problems:    Problems with blood clotting too easily:        Skin    Rashes or ulcers:        Constitutional    Fever or chills:      PHYSICAL EXAM: Vitals:   04/09/18 0917 04/09/18 0918  BP: (!) 178/113 Marland Kitchen)  183/108  Pulse: 84   Resp: 18   SpO2: 98%   Weight: 136 lb 3.2 oz (61.8 kg)   Height: 4\' 11"  (1.499 m)     GENERAL: The patient is a well-nourished female, in no acute distress. The  vital signs are documented above. CARDIOVASCULAR: Carotid arteries without bruits bilaterally.  2+ radial pulses.  2+ dorsalis pedis pulses PULMONARY: There is good air exchange  ABDOMEN: Soft and non-tender  MUSCULOSKELETAL: There are no major deformities or cyanosis. NEUROLOGIC: No focal weakness or paresthesias are detected. SKIN: There are no ulcers or rashes noted. PSYCHIATRIC: The patient has a normal affect.  DATA:  Sed rate elevated at 66  MEDICAL ISSUES: I discussed the indication for temporal artery biopsy.  Explained that this would not provide any relief regarding her headache but was simply to dictate whether steroid use was indicated and also duration.  Explained the procedure as outpatient with a small incision just anterior to the ear with a very good cosmetic result.  Explained that it would be proximally 48 hours before pathology results are returned and her medical doctor could make recommendations guarding further treatment.  We will schedule this for tomorrow as an outpatient at [facility]   Rosetta Posner, MD [facility] Vascular and Vein Specialists of [facility] Tel [phone] Pager [phone]

## 2018-04-09 NOTE — Progress Notes (Signed)
Spoke with pt for pre-op call. Pt denies cardiac history and Diabetes. Instructed pt not to smoke as of now until after surgery. Pt voiced understanding.

## 2018-04-09 NOTE — Progress Notes (Signed)
Vascular and Vein Specialist of [place]  Patient name: [name] MRN: [id number] DOB: [birth year] Sex: female  REASON FOR CONSULT: Evaluation for temporal artery biopsy  HPI: [name] is a 57 y.o. female, who is here today for evaluation of temporal artery biopsy.  She is a very pleasant 57 year old who reports one half 2 weeks ago sudden onset of severe right-sided headache.  She has had this continuously with no relief.  She was given steroids and is had no difference from this.  She did report that she had some tenderness over her right temporal area but this has resolved.  Is light sensitive as well.  Had not had a prior history of headache.  No history of cardiac disease and no history of peripheral vascular occlusive disease  Past Medical History:  Diagnosis Date  . Anemia   . Arthritis   . GERD (gastroesophageal reflux disease)   . Hiatal hernia   . Hyperlipidemia   . Hypertension   . Plantar fasciitis     Family History  Problem Relation Age of Onset  . Cancer Mother 97       pancreatic cancer  . Pancreatic cancer Mother   . Heart disease Mother   . Cancer Father 46       throat cancer  . Cancer Maternal Aunt 60       breast cancer  . Cancer Other        Died  from Colon cancer in 79's  . Colon cancer Maternal Uncle   . Colon cancer Paternal Uncle   . Heart disease Sister     SOCIAL HISTORY: Social History   Socioeconomic History  . Marital status: Divorced    Spouse name: Not on file  . Number of children: Not on file  . Years of education: Not on file  . Highest education level: Not on file  Occupational History  . 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 Financial resource strain: Not on file  . Food insecurity:    Worry: Not on file    Inability: Not on file  . Transportation needs:    Medical: Not on file    Non-medical: Not on file  Tobacco Use  .  Smoking status: Current Every Day Smoker    Packs/day: 0.10    Types: Cigarettes  . Smokeless tobacco: Never Used  . Tobacco comment: Down to 1-2 cig/day.  Substance and Sexual Activity  . Alcohol use: Yes    Comment: occasionally  . Drug use: No  . Sexual activity: Not on file  Lifestyle  . Physical activity:    Days per week: Not on file    Minutes per session: Not on file  . Stress: Not on file  Relationships  . Social connections:    Talks on phone: Not on file    Gets together: Not on file    Attends religious service: Not on file    Active member of club or organization: Not on file    Attends meetings of clubs or organizations: Not on file    Relationship status: Not on file  . Intimate partner violence:    Fear of current or ex partner: Not on file    Emotionally abused: Not on file    Physically abused: Not on file    Forced sexual activity: Not on file  Other Topics Concern  . Not on file  Social  History Narrative   10 th grade education.    Allergies  Allergen Reactions  . Aspirin Hives  . Hydromorphone Hcl Hives and Nausea And Vomiting    Current Outpatient Medications  Medication Sig Dispense Refill  . acetaminophen (TYLENOL) 325 MG tablet Take 2 tablets (650 mg total) by mouth every 6 (six) hours as needed for mild pain, moderate pain or fever. 30 tablet 0  . AMBULATORY NON FORMULARY MEDICATION Medication Name: nitroglycerin 0.125 mg ointment mixed with 2% lidocaine three times a day for 6-8 weeks 1 Tube 1  . amLODipine (NORVASC) 2.5 MG tablet Take 1 tablet (2.5 mg total) by mouth daily. 90 tablet 1  . ferrous sulfate (IRON SUPPLEMENT) 325 (65 FE) MG tablet Take 1 tablet (325 mg total) by mouth daily with breakfast. 30 tablet 3  . gabapentin (NEURONTIN) 100 MG capsule Take 1 capsule (100 mg total) by mouth 2 (two) times daily. 60 capsule 0  . hydrocortisone (ANUSOL-HC) 2.5 % rectal cream Place 1 application rectally 2 (two) times daily. 30 g 1  .  lisinopril (PRINIVIL,ZESTRIL) 20 MG tablet Take 1 tablet (20 mg total) by mouth daily. 90 tablet 1  . omeprazole (PRILOSEC) 20 MG capsule TAKE 1 CAPSULE BY MOUTH ONCE DAILY 90 capsule 3  . predniSONE (DELTASONE) 20 MG tablet Take 3 tablets (60 mg total) by mouth daily with breakfast. 90 tablet 0  . traMADol (ULTRAM) 50 MG tablet Take 1 tablet (50 mg total) by mouth every 6 (six) hours as needed. 30 tablet 0  . traMADol (ULTRAM) 50 MG tablet Take 1 tablet (50 mg total) by mouth 3 (three) times daily. 90 tablet 2   No current facility-administered medications for this visit.     REVIEW OF SYSTEMS:  [X]  denotes positive finding, [ ]  denotes negative finding Cardiac  Comments:  Chest pain or chest pressure:    Shortness of breath upon exertion:    Short of breath when lying flat:    Irregular heart rhythm:        Vascular    Pain in calf, thigh, or hip brought on by ambulation:    Pain in feet at night that wakes you up from your sleep:     Blood clot in your veins:    Leg swelling:         Pulmonary    Oxygen at home:    Productive cough:     Wheezing:         Neurologic    Sudden weakness in arms or legs:     Sudden numbness in arms or legs:     Sudden onset of difficulty speaking or slurred speech:    Temporary loss of vision in one eye:     Problems with dizziness:         Gastrointestinal    Blood in stool:     Vomited blood:         Genitourinary    Burning when urinating:     Blood in urine:        Psychiatric    Major depression:         Hematologic    Bleeding problems:    Problems with blood clotting too easily:        Skin    Rashes or ulcers:        Constitutional    Fever or chills:      PHYSICAL EXAM: Vitals:   04/09/18 0917 04/09/18 0918  BP: (!) 178/113 Marland Kitchen)  183/108  Pulse: 84   Resp: 18   SpO2: 98%   Weight: 136 lb 3.2 oz (61.8 kg)   Height: 4\' 11"  (1.499 m)     GENERAL: The patient is a well-nourished female, in no acute distress. The  vital signs are documented above. CARDIOVASCULAR: Carotid arteries without bruits bilaterally.  2+ radial pulses.  2+ dorsalis pedis pulses PULMONARY: There is good air exchange  ABDOMEN: Soft and non-tender  MUSCULOSKELETAL: There are no major deformities or cyanosis. NEUROLOGIC: No focal weakness or paresthesias are detected. SKIN: There are no ulcers or rashes noted. PSYCHIATRIC: The patient has a normal affect.  DATA:  Sed rate elevated at 66  MEDICAL ISSUES: I discussed the indication for temporal artery biopsy.  Explained that this would not provide any relief regarding her headache but was simply to dictate whether steroid use was indicated and also duration.  Explained the procedure as outpatient with a small incision just anterior to the ear with a very good cosmetic result.  Explained that it would be proximally 48 hours before pathology results are returned and her medical doctor could make recommendations guarding further treatment.  We will schedule this for tomorrow as an outpatient at [facility]   Rosetta Posner, MD [facility] Vascular and Vein Specialists of [facility] Tel [phone] Pager [phone]

## 2018-04-10 ENCOUNTER — Encounter (HOSPITAL_COMMUNITY)
Admission: RE | Disposition: A | Discharge status: Home / Self Care | Payer: Self-pay | Source: Ambulatory Visit | Attending: Vascular Surgery

## 2018-04-10 ENCOUNTER — Ambulatory Visit (HOSPITAL_COMMUNITY): Payer: BLUE CROSS/BLUE SHIELD | Admitting: Anesthesiology

## 2018-04-10 ENCOUNTER — Encounter (HOSPITAL_COMMUNITY): Payer: Self-pay | Admitting: Anesthesiology

## 2018-04-10 ENCOUNTER — Other Ambulatory Visit: Payer: Self-pay

## 2018-04-10 ENCOUNTER — Ambulatory Visit (HOSPITAL_COMMUNITY)
Admission: RE | Admit: 2018-04-10 | Discharge: 2018-04-10 | Disposition: A | Discharge status: Home / Self Care | Payer: BLUE CROSS/BLUE SHIELD | Source: Ambulatory Visit | Attending: Vascular Surgery | Admitting: Vascular Surgery

## 2018-04-10 DIAGNOSIS — I10 Essential (primary) hypertension: Secondary | ICD-10-CM | POA: Insufficient documentation

## 2018-04-10 DIAGNOSIS — F17210 Nicotine dependence, cigarettes, uncomplicated: Secondary | ICD-10-CM | POA: Insufficient documentation

## 2018-04-10 DIAGNOSIS — K219 Gastro-esophageal reflux disease without esophagitis: Secondary | ICD-10-CM | POA: Diagnosis not present

## 2018-04-10 DIAGNOSIS — Z79899 Other long term (current) drug therapy: Secondary | ICD-10-CM | POA: Diagnosis not present

## 2018-04-10 DIAGNOSIS — D649 Anemia, unspecified: Secondary | ICD-10-CM | POA: Insufficient documentation

## 2018-04-10 DIAGNOSIS — R51 Headache: Secondary | ICD-10-CM | POA: Insufficient documentation

## 2018-04-10 HISTORY — PX: ARTERY BIOPSY: SHX891

## 2018-04-10 LAB — CBC
HCT: 37.4 % (ref 36.0–46.0)
HEMOGLOBIN: 11.2 g/dL — AB (ref 12.0–15.0)
MCH: 21.5 pg — AB (ref 26.0–34.0)
MCHC: 29.9 g/dL — AB (ref 30.0–36.0)
MCV: 71.6 fL — AB (ref 80.0–100.0)
Platelets: 354 10*3/uL (ref 150–400)
RBC: 5.22 MIL/uL — ABNORMAL HIGH (ref 3.87–5.11)
RDW: 14.5 % (ref 11.5–15.5)
WBC: 6.6 10*3/uL (ref 4.0–10.5)
nRBC: 0 % (ref 0.0–0.2)

## 2018-04-10 LAB — BASIC METABOLIC PANEL
Anion gap: 10 (ref 5–15)
BUN: 17 mg/dL (ref 6–20)
CHLORIDE: 108 mmol/L (ref 98–111)
CO2: 28 mmol/L (ref 22–32)
CREATININE: 0.63 mg/dL (ref 0.44–1.00)
Calcium: 8.9 mg/dL (ref 8.9–10.3)
GFR calc Af Amer: >60 mL/min (ref >60)
GFR calc non Af Amer: >60 mL/min (ref >60)
GLUCOSE: 88 mg/dL (ref 70–99)
Potassium: 3.5 mmol/L (ref 3.5–5.1)
Sodium: 146 mmol/L — ABNORMAL HIGH (ref 135–145)

## 2018-04-10 LAB — SURGICAL PCR SCREEN
MRSA, PCR: NEGATIVE
Staphylococcus aureus: NEGATIVE

## 2018-04-10 SURGERY — BIOPSY TEMPORAL ARTERY
Anesthesia: General | Site: Head | Laterality: Right

## 2018-04-10 MED ORDER — ONDANSETRON HCL 4 MG/2ML IJ SOLN
INTRAMUSCULAR | Status: DC | PRN
  Administered 2018-04-10: 4 mg via INTRAVENOUS

## 2018-04-10 MED ORDER — LIDOCAINE-EPINEPHRINE 0.5 %-1:200000 IJ SOLN
INTRAMUSCULAR | Status: AC
  Filled 2018-04-10: qty 1

## 2018-04-10 MED ORDER — TRAMADOL HCL 50 MG PO TABS: 50.0000 mg | ORAL_TABLET | Freq: Four times a day (QID) | ORAL | 0 refills | Status: DC | PRN

## 2018-04-10 MED ORDER — LIDOCAINE-EPINEPHRINE 0.5 %-1:200000 IJ SOLN: INTRAMUSCULAR | Status: DC | PRN

## 2018-04-10 MED ORDER — LABETALOL HCL 5 MG/ML IV SOLN
INTRAVENOUS | Status: AC
  Administered 2018-04-10: 5 mg via INTRAVENOUS
  Filled 2018-04-10: qty 4

## 2018-04-10 MED ORDER — MUPIROCIN 2 % EX OINT
1.0000 "application " | TOPICAL_OINTMENT | Freq: Once | CUTANEOUS | Status: AC
  Administered 2018-04-10: 1 via TOPICAL

## 2018-04-10 MED ORDER — LACTATED RINGERS IV SOLN
INTRAVENOUS | Status: DC
  Administered 2018-04-10 (×2): via INTRAVENOUS

## 2018-04-10 MED ORDER — LIDOCAINE 2% (20 MG/ML) 5 ML SYRINGE
INTRAMUSCULAR | Status: DC | PRN
  Administered 2018-04-10: 50 mg via INTRAVENOUS

## 2018-04-10 MED ORDER — PROPOFOL 10 MG/ML IV BOLUS
INTRAVENOUS | Status: AC
  Filled 2018-04-10: qty 20

## 2018-04-10 MED ORDER — LABETALOL HCL 5 MG/ML IV SOLN
INTRAVENOUS | Status: DC | PRN
  Administered 2018-04-10: 5 mg via INTRAVENOUS

## 2018-04-10 MED ORDER — PHENYLEPHRINE 40 MCG/ML (10ML) SYRINGE FOR IV PUSH (FOR BLOOD PRESSURE SUPPORT)
PREFILLED_SYRINGE | INTRAVENOUS | Status: AC
  Filled 2018-04-10: qty 10

## 2018-04-10 MED ORDER — PROMETHAZINE HCL 25 MG/ML IJ SOLN
6.2500 mg | INTRAMUSCULAR | Status: DC | PRN
  Administered 2018-04-10: 6.25 mg via INTRAVENOUS

## 2018-04-10 MED ORDER — PROMETHAZINE HCL 25 MG/ML IJ SOLN
INTRAMUSCULAR | Status: AC
  Administered 2018-04-10: 6.25 mg via INTRAVENOUS
  Filled 2018-04-10: qty 1

## 2018-04-10 MED ORDER — FENTANYL CITRATE (PF) 100 MCG/2ML IJ SOLN
INTRAMUSCULAR | Status: AC
  Filled 2018-04-10: qty 2

## 2018-04-10 MED ORDER — SODIUM CHLORIDE 0.9 % IV SOLN: INTRAVENOUS | Status: DC

## 2018-04-10 MED ORDER — FENTANYL CITRATE (PF) 100 MCG/2ML IJ SOLN
25.0000 ug | INTRAMUSCULAR | Status: DC | PRN
  Administered 2018-04-10: 50 ug via INTRAVENOUS

## 2018-04-10 MED ORDER — MUPIROCIN 2 % EX OINT
TOPICAL_OINTMENT | CUTANEOUS | Status: AC
  Administered 2018-04-10: 1 via TOPICAL
  Filled 2018-04-10: qty 22

## 2018-04-10 MED ORDER — LABETALOL HCL 5 MG/ML IV SOLN
5.0000 mg | INTRAVENOUS | Status: DC | PRN
  Administered 2018-04-10: 5 mg via INTRAVENOUS

## 2018-04-10 MED ORDER — PROPOFOL 10 MG/ML IV BOLUS
INTRAVENOUS | Status: DC | PRN
  Administered 2018-04-10: 50 mg via INTRAVENOUS
  Administered 2018-04-10: 150 mg via INTRAVENOUS

## 2018-04-10 MED ORDER — MIDAZOLAM HCL 2 MG/2ML IJ SOLN
INTRAMUSCULAR | Status: DC | PRN
  Administered 2018-04-10: 2 mg via INTRAVENOUS

## 2018-04-10 MED ORDER — TRAMADOL HCL 50 MG PO TABS: 50.0000 mg | ORAL_TABLET | Freq: Four times a day (QID) | ORAL | 2 refills | Status: DC | PRN

## 2018-04-10 MED ORDER — FENTANYL CITRATE (PF) 250 MCG/5ML IJ SOLN
INTRAMUSCULAR | Status: DC | PRN
  Administered 2018-04-10: 25 ug via INTRAVENOUS
  Administered 2018-04-10 (×2): 50 ug via INTRAVENOUS
  Administered 2018-04-10: 25 ug via INTRAVENOUS
  Administered 2018-04-10 (×2): 50 ug via INTRAVENOUS

## 2018-04-10 MED ORDER — CEFAZOLIN SODIUM-DEXTROSE 2-4 GM/100ML-% IV SOLN
2.0000 g | INTRAVENOUS | Status: AC
  Administered 2018-04-10: 2 g via INTRAVENOUS
  Filled 2018-04-10: qty 100

## 2018-04-10 MED ORDER — DEXAMETHASONE SODIUM PHOSPHATE 10 MG/ML IJ SOLN
INTRAMUSCULAR | Status: DC | PRN
  Administered 2018-04-10: 4 mg via INTRAVENOUS

## 2018-04-10 MED ORDER — MIDAZOLAM HCL 2 MG/2ML IJ SOLN
INTRAMUSCULAR | Status: AC
  Filled 2018-04-10: qty 2

## 2018-04-10 MED ORDER — FENTANYL CITRATE (PF) 250 MCG/5ML IJ SOLN
INTRAMUSCULAR | Status: AC
  Filled 2018-04-10: qty 5

## 2018-04-10 MED FILL — traMADol HCL 50 MG TABS: 50 | 3 days supply | Qty: 12 | Fill #0

## 2018-04-10 SURGICAL SUPPLY — 36 items
CANISTER SUCT 3000ML PPV (MISCELLANEOUS) ×2 IMPLANT
CLIP LIGATING EXTRA SM BLUE (MISCELLANEOUS) ×2 IMPLANT
CONT SPEC 4OZ CLIKSEAL STRL BL (MISCELLANEOUS) ×2 IMPLANT
COTTONBALL LRG STERILE PKG (GAUZE/BANDAGES/DRESSINGS) ×4 IMPLANT
COVER SURGICAL LIGHT HANDLE (MISCELLANEOUS) ×4 IMPLANT
COVER WAND RF STERILE (DRAPES) ×2 IMPLANT
DECANTER SPIKE VIAL GLASS SM (MISCELLANEOUS) ×2 IMPLANT
DERMABOND ADVANCED (GAUZE/BANDAGES/DRESSINGS) ×1
DERMABOND ADVANCED .7 DNX12 (GAUZE/BANDAGES/DRESSINGS) ×1 IMPLANT
DRAPE LAPAROTOMY T 102X78X121 (DRAPES) ×2 IMPLANT
ELECT REM PT RETURN 9FT ADLT (ELECTROSURGICAL) ×2
ELECTRODE REM PT RTRN 9FT ADLT (ELECTROSURGICAL) ×1 IMPLANT
GLOVE BIO SURGEON STRL SZ 6.5 (GLOVE) ×2 IMPLANT
GLOVE BIOGEL PI IND STRL 6.5 (GLOVE) ×1 IMPLANT
GLOVE BIOGEL PI INDICATOR 6.5 (GLOVE) ×1
GLOVE SS BIOGEL STRL SZ 7.5 (GLOVE) ×1 IMPLANT
GLOVE SUPERSENSE BIOGEL SZ 7.5 (GLOVE) ×1
GOWN STRL REUS W/ TWL LRG LVL3 (GOWN DISPOSABLE) ×3 IMPLANT
GOWN STRL REUS W/TWL LRG LVL3 (GOWN DISPOSABLE) ×3
KIT BASIN OR (CUSTOM PROCEDURE TRAY) ×2 IMPLANT
KIT TURNOVER KIT B (KITS) ×2 IMPLANT
LOOP VESSEL MAXI BLUE (MISCELLANEOUS) ×2 IMPLANT
NEEDLE HYPO 25GX1X1/2 BEV (NEEDLE) ×2 IMPLANT
NS IRRIG 1000ML POUR BTL (IV SOLUTION) ×2 IMPLANT
PACK GENERAL/GYN (CUSTOM PROCEDURE TRAY) ×2 IMPLANT
PAD ARMBOARD 7.5X6 YLW CONV (MISCELLANEOUS) ×4 IMPLANT
SPONGE LAP 4X18 RFD (DISPOSABLE) ×2 IMPLANT
SUT SILK 3 0 (SUTURE) ×1
SUT SILK 3-0 18XBRD TIE 12 (SUTURE) ×1 IMPLANT
SUT SILK 4 0 TIES 17X18 (SUTURE) ×2 IMPLANT
SUT VIC AB 3-0 SH 27 (SUTURE) ×1
SUT VIC AB 3-0 SH 27X BRD (SUTURE) ×1 IMPLANT
SUT VICRYL 4-0 PS2 18IN ABS (SUTURE) ×2 IMPLANT
SYR CONTROL 10ML LL (SYRINGE) ×2 IMPLANT
TOWEL GREEN STERILE (TOWEL DISPOSABLE) ×2 IMPLANT
WATER STERILE IRR 1000ML POUR (IV SOLUTION) IMPLANT

## 2018-04-10 NOTE — Op Note (Signed)
    OPERATIVE REPORT  DATE OF SURGERY: 04/10/2018  PATIENT: [name], 56 y.o. female MRN: [id number]  DOB: 10/16/1960  PRE-OPERATIVE DIAGNOSIS: New onset headache with possible temporal arteritis  POST-OPERATIVE DIAGNOSIS:  Same  PROCEDURE: Right temporal artery biopsy  SURGEON:  Curt Jews, M.D.  PHYSICIAN ASSISTANT: Nurse  ANESTHESIA: LMA  EBL: per anesthesia record  Total I/O In: 800 [I.V.:800] Out: 20 [Blood:20]  BLOOD ADMINISTERED: none  DRAINS: none  SPECIMEN: none  COUNTS CORRECT:  YES  PATIENT DISPOSITION:  PACU - hemodynamically stable  PROCEDURE DETAILS: Patient was taken to the operative placed to position where the area of the right periauricular region was prepped and draped in usual sterile fashion.  Incision was made anterior to the ear at the temporal artery pulse and carried down through the subcutaneous tissue.  The artery was tortuous.  The artery was mobilized proximally distally and was ligated proximally and distally with 3-0 silk ties.  The specimen was divided and passed off the field for permanent section.  The wound was irrigated with saline and hemostasis obtained with cautery.  The wound was closed with 3-0 Vicryl to reapproximate subcutaneous tissue and interrupted sutures.  The skin was closed with a 4-0 subcuticular Vicryl stitch.  Sterile dressing was applied and the patient was transferred to the recovery room in stable condition   [name], M.D., [facility] 04/10/2018 1:12 PM

## 2018-04-10 NOTE — Anesthesia Preprocedure Evaluation (Signed)
Anesthesia Evaluation  Patient identified by MRN, date of birth, ID band Patient awake    Reviewed: Allergy & Precautions, H&P , NPO status , Patient's Chart, lab work & pertinent test results  Airway Mallampati: I  TM Distance: >3 FB Neck ROM: Full    Dental no notable dental hx. (+) Teeth Intact, Dental Advisory Given   Pulmonary Current Smoker,    Pulmonary exam normal breath sounds clear to auscultation       Cardiovascular hypertension, Pt. on medications  Rhythm:Regular Rate:Normal     Neuro/Psych  Headaches, negative psych ROS   GI/Hepatic Neg liver ROS, hiatal hernia, GERD  Medicated and Controlled,  Endo/Other  negative endocrine ROS  Renal/GU negative Renal ROS  negative genitourinary   Musculoskeletal  (+) Arthritis , Osteoarthritis,    Abdominal   Peds  Hematology  (+) Blood dyscrasia, anemia ,   Anesthesia Other Findings   Reproductive/Obstetrics negative OB ROS                             Anesthesia Physical Anesthesia Plan  ASA: II  Anesthesia Plan: General   Post-op Pain Management:    Induction: Intravenous  PONV Risk Score and Plan: 3 and Ondansetron, Dexamethasone and Midazolam  Airway Management Planned: LMA  Additional Equipment:   Intra-op Plan:   Post-operative Plan: Extubation in OR  Informed Consent: I have reviewed the patients History and Physical, chart, labs and discussed the procedure including the risks, benefits and alternatives for the proposed anesthesia with the patient or authorized representative who has indicated his/her understanding and acceptance.   Dental advisory given  Plan Discussed with: CRNA  Anesthesia Plan Comments:         Anesthesia Quick Evaluation

## 2018-04-10 NOTE — Anesthesia Procedure Notes (Signed)
Procedure Name: LMA Insertion Date/Time: 04/10/2018 12:32 PM Performed by: Renato Shin, CRNA Pre-anesthesia Checklist: Patient identified, Emergency Drugs available, Suction available and Patient being monitored Patient Re-evaluated:Patient Re-evaluated prior to induction Oxygen Delivery Method: Circle system utilized Preoxygenation: Pre-oxygenation with 100% oxygen Induction Type: IV induction LMA: LMA inserted LMA Size: 3.0 Number of attempts: 1 Placement Confirmation: positive ETCO2,  CO2 detector and breath sounds checked- equal and bilateral Tube secured with: Tape Dental Injury: Teeth and Oropharynx as per pre-operative assessment

## 2018-04-10 NOTE — Transfer of Care (Signed)
Immediate Anesthesia Transfer of Care Note  Patient: [name]  Procedure(s) Performed: BIOPSY TEMPORAL ARTERY (Right Head)  Patient Location: PACU  Anesthesia Type:General  Level of Consciousness: awake, alert , oriented and patient cooperative  Airway & Oxygen Therapy: Patient Spontanous Breathing and Patient connected to nasal cannula oxygen  Post-op Assessment: Report given to RN, Post -op Vital signs reviewed and stable and Patient moving all extremities X 4  Post vital signs: Reviewed and stable  Last Vitals:  Vitals Value Taken Time  BP 143/102 04/10/2018  1:20 PM  Temp 36.7 C 04/10/2018  1:20 PM  Pulse 87 04/10/2018  1:20 PM  Resp 9 04/10/2018  1:20 PM  SpO2 99 % 04/10/2018  1:20 PM  Vitals shown include unvalidated device data.  Last Pain:  Vitals:   04/10/18 1156  TempSrc:   PainSc: 8          Complications: No apparent anesthesia complications

## 2018-04-10 NOTE — Discharge Instructions (Signed)
Keep incision clean and dry Call Vascular and Vein office with any problems or concerns [phone]

## 2018-04-10 NOTE — Interval H&P Note (Signed)
History and Physical Interval Note:  04/10/2018 12:08 PM  [name]  has presented today for surgery, with the diagnosis of headache  The various methods of treatment have been discussed with the patient and family. After consideration of risks, benefits and other options for treatment, the patient has consented to  Procedure(s): BIOPSY TEMPORAL ARTERY (Right) as a surgical intervention .  The patient's history has been reviewed, patient examined, no change in status, stable for surgery.  I have reviewed the patient's chart and labs.  Questions were answered to the patient's satisfaction.     Curt Jews

## 2018-04-10 NOTE — Anesthesia Postprocedure Evaluation (Signed)
Anesthesia Post Note  Patient: [name]  Procedure(s) Performed: BIOPSY TEMPORAL ARTERY (Right Head)     Patient location during evaluation: PACU Anesthesia Type: General Level of consciousness: awake and alert Pain management: pain level controlled Vital Signs Assessment: post-procedure vital signs reviewed and stable Respiratory status: spontaneous breathing, nonlabored ventilation and respiratory function stable Cardiovascular status: blood pressure returned to baseline and stable Postop Assessment: no apparent nausea or vomiting Anesthetic complications: no    Last Vitals:  Vitals:   04/10/18 1430 04/10/18 1437  BP: (!) 150/87 (!) 153/88  Pulse: 74 74  Resp: 18 17  Temp: (!) 36.4 C   SpO2: 97% 95%    Last Pain:  Vitals:   04/10/18 1437  TempSrc:   PainSc: Asleep                 Neriah Brott,W. EDMOND

## 2018-04-11 ENCOUNTER — Telehealth: Payer: Self-pay | Admitting: Internal Medicine

## 2018-04-11 ENCOUNTER — Encounter (HOSPITAL_COMMUNITY): Payer: Self-pay | Admitting: Vascular Surgery

## 2018-04-11 NOTE — Telephone Encounter (Signed)
Called patient, left voicemail. Biopsy results will take a few days to result. Her biopsy was yesterday and results will likely not be ready until Monday. If resulted sooner, I will certainly give her a call.

## 2018-04-11 NOTE — Telephone Encounter (Signed)
Pt is calling about results; pt contact [phone]

## 2018-04-12 ENCOUNTER — Telehealth: Payer: Self-pay | Admitting: Internal Medicine

## 2018-04-12 DIAGNOSIS — R51 Headache: Principal | ICD-10-CM

## 2018-04-12 DIAGNOSIS — G4452 New daily persistent headache (NDPH): Secondary | ICD-10-CM

## 2018-04-12 DIAGNOSIS — R519 Headache, unspecified: Secondary | ICD-10-CM

## 2018-04-12 NOTE — Telephone Encounter (Signed)
Temporal artery biopsy performed 10/16 is negative for evidence of inflammation or giant cells. Called patient with results. Unfortunately we do not have an explanation for her new onset headache. She continues to have severe persistent headache, localized to her right temporal region. Gave patient instructions to taper steroids. Currently taking 60 mg daily. Instructed patient to take 40 mg starting tomorrow (2 tablets) x 3 days, then 20 mg (1 tablet) x 3 days, then 10 mg (1/2 tablet) x 3 days, then stop. Will order MRI brain for further work up.

## 2018-04-15 ENCOUNTER — Ambulatory Visit (HOSPITAL_COMMUNITY): Admission: RE | Admit: 2018-04-15 | Payer: BLUE CROSS/BLUE SHIELD | Source: Ambulatory Visit

## 2018-04-15 ENCOUNTER — Telehealth: Payer: Self-pay | Admitting: Internal Medicine

## 2018-04-15 MED ORDER — LORAZEPAM 1 MG PO TABS: 1.0000 mg | ORAL_TABLET | ORAL | 0 refills | Status: AC

## 2018-04-15 MED FILL — LORazepam 1 MG TABS: 1 | 1 days supply | Qty: 1 | Fill #0

## 2018-04-15 NOTE — Telephone Encounter (Signed)
Called patient regarding her scheduled MRI this afternoon. Patient is unable to make the appointment due to transportation issues and not having a ride. She also requests medication for claustrophobia. Reports she cannot tolerate MRIs without sedation. Called in ativan 1 mg x 1 to pharmacy, to be taken prior to MRI. Instructed patient to bring prescription with her to her MRI appointment which will be rescheduled.

## 2018-04-16 ENCOUNTER — Other Ambulatory Visit: Payer: Self-pay | Admitting: Internal Medicine

## 2018-04-16 ENCOUNTER — Ambulatory Visit (HOSPITAL_COMMUNITY)
Admission: RE | Admit: 2018-04-16 | Discharge: 2018-04-16 | Disposition: A | Discharge status: Home / Self Care | Payer: BLUE CROSS/BLUE SHIELD | Source: Ambulatory Visit | Attending: Student in an Organized Health Care Education/Training Program | Admitting: Student in an Organized Health Care Education/Training Program

## 2018-04-16 DIAGNOSIS — R519 Headache, unspecified: Secondary | ICD-10-CM

## 2018-04-16 DIAGNOSIS — G4452 New daily persistent headache (NDPH): Secondary | ICD-10-CM

## 2018-04-16 DIAGNOSIS — R51 Headache: Secondary | ICD-10-CM

## 2018-04-16 MED ORDER — DIAZEPAM 5 MG PO TABS: ORAL_TABLET | ORAL | 0 refills | Status: DC

## 2018-04-16 MED FILL — AMLODIPINE 2.5 MG TABLET: 2.5 | 90 days supply | Qty: 90 | Fill #1

## 2018-04-16 MED FILL — LISINOPRIL 20 MG TABLET: 20 | 90 days supply | Qty: 90 | Fill #3

## 2018-04-16 MED FILL — diazePAM 5 MG TABS: 5 | 2 days supply | Qty: 2 | Fill #0

## 2018-04-16 NOTE — Progress Notes (Signed)
Pt states she was scheduled for MRI, but was unable to tolerate going into MRI machine.  Was given rx for ativan, but this did not help.  Discussed with pcp-pt was rx'd valium and will pick up today at [place] .Despina Hidden Cassady10/22/20195:17 PM

## 2018-04-17 ENCOUNTER — Ambulatory Visit (HOSPITAL_COMMUNITY)
Admission: RE | Admit: 2018-04-17 | Discharge: 2018-04-17 | Disposition: A | Discharge status: Home / Self Care | Payer: BLUE CROSS/BLUE SHIELD | Source: Ambulatory Visit | Attending: Student in an Organized Health Care Education/Training Program | Admitting: Student in an Organized Health Care Education/Training Program

## 2018-04-17 ENCOUNTER — Telehealth: Payer: Self-pay

## 2018-04-17 DIAGNOSIS — R51 Headache: Principal | ICD-10-CM

## 2018-04-17 DIAGNOSIS — R519 Headache, unspecified: Secondary | ICD-10-CM

## 2018-04-17 NOTE — Telephone Encounter (Signed)
Pt calls and states that the h/a's she has been experiencing are getting worse, states she cannot work because of them, they feel constantly like a big electric shock is going thru her head. She ask "is there anything the doctor can do, im having my mri at [facility] today but I cant take anymore"

## 2018-04-17 NOTE — Progress Notes (Signed)
Pt has attempted twice to come to our dept and have MRI with two separate medications.  Pt said she did not feel anything from the meds at all.  She said she would go to her doc's office tomorrow to inquire what is the next step.  I know for this facility the next step if MRI is desired is pt to be done under General Anesthesia.

## 2018-04-17 NOTE — Telephone Encounter (Signed)
Requesting to speak with a nurse about headache. Please call back.

## 2018-04-19 ENCOUNTER — Encounter: Payer: Self-pay | Admitting: Internal Medicine

## 2018-04-19 MED ORDER — TRAMADOL HCL 50 MG PO TABS: 50.0000 mg | ORAL_TABLET | Freq: Four times a day (QID) | ORAL | 0 refills | Status: DC | PRN

## 2018-04-19 MED FILL — traMADol HCL 50 MG TABS: 50 | 5 days supply | Qty: 20 | Fill #0

## 2018-04-19 NOTE — Telephone Encounter (Signed)
Pt stated she continues have "shocking" h/a's. [facility] change MRI to open MRI on [address]; they have not called the pt yet. Requesting something for the h/a's also a letter for her job since she has been out -for the whole week. Thanks

## 2018-04-19 NOTE — Telephone Encounter (Addendum)
Pt's here at the clinic - MRI appt changed to [facility] open mri on Wed 10/30 (earliest appt) Requesting pre-med "just in case".  Took Tylenol which did not help; requesting something else for the pain. Also asking about letter for work.

## 2018-04-19 NOTE — Telephone Encounter (Signed)
Letter was picked up today by pt.

## 2018-04-19 NOTE — Telephone Encounter (Signed)
Called pt - informed letter written by Dr Philipp Ovens ready for pick up ; also Tramadol rx was sent to [place].

## 2018-04-19 NOTE — Telephone Encounter (Signed)
There is a letter documented on 10/9 that instructed her to stay out of work until cleared by a physician. You should be able to print this under the letter tab. Dr. Lynnae January has already sent a prescription for Valium to her pharmacy on 10/22 to be taken prior to MRI. As far as pain management, this is difficult as we do not know what we are treating at the moment. I will send a prescription for short term PRN tramadol to pharmacy.

## 2018-04-22 ENCOUNTER — Encounter: Payer: Self-pay | Admitting: Internal Medicine

## 2018-04-22 ENCOUNTER — Telehealth: Payer: Self-pay

## 2018-04-22 NOTE — Telephone Encounter (Signed)
Dr Vanita Panda wrote letter. See note on 23rd. Will her note not suffice?

## 2018-04-22 NOTE — Telephone Encounter (Signed)
Needs to speak with a nurse about a note for work. Please call pt back.

## 2018-04-22 NOTE — Telephone Encounter (Signed)
Pt called and is requesting an out of work letter from 10/15 through 10/31.  She is having an MRI on 10/30 at [place] (per pt) and has a f/u appt with pcp on 10/31.  Thank you.  Carson, RN

## 2018-04-23 ENCOUNTER — Other Ambulatory Visit: Payer: Self-pay

## 2018-04-23 ENCOUNTER — Ambulatory Visit: Payer: BLUE CROSS/BLUE SHIELD | Admitting: Internal Medicine

## 2018-04-23 MED ORDER — DIAZEPAM 5 MG PO TABS: ORAL_TABLET | ORAL | 0 refills | Status: DC

## 2018-04-23 MED FILL — diazePAM 5 MG TABS: 5 | 1 days supply | Qty: 2 | Fill #0

## 2018-04-23 NOTE — Telephone Encounter (Signed)
I sent in two valium prior to the last MRI but never got that MRI. She should still have those two.

## 2018-04-23 NOTE — Telephone Encounter (Signed)
Stated she will pick up letter tomorrow after she has the MRI. She wanted to know if medication could be ordered just in case she needs for the MRI?

## 2018-04-23 NOTE — Telephone Encounter (Signed)
Pt called / informed of Valium rx for MRI tomorrow.

## 2018-04-23 NOTE — Telephone Encounter (Signed)
Requesting med refill for MRI tomorrow. Please call back.

## 2018-04-23 NOTE — Telephone Encounter (Signed)
Sh took the ativan Dr Philipp Ovens gave her before the aborted MRI. She never had chance to take the valium I gave her before that [place]. Low risk so I am refilling.

## 2018-04-23 NOTE — Telephone Encounter (Signed)
2 tabs valium 5 mg sent to [place] on 04/16/2018. Returned call to patient, states she took both tabs at first MRI. Has second MRI at [facility] tomorrow. Requesting 2 more tabs of valium sent to [place]. Hubbard Hartshorn, RN, BSN

## 2018-04-25 ENCOUNTER — Ambulatory Visit (INDEPENDENT_AMBULATORY_CARE_PROVIDER_SITE_OTHER): Payer: BLUE CROSS/BLUE SHIELD | Admitting: Internal Medicine

## 2018-04-25 ENCOUNTER — Encounter: Payer: Self-pay | Admitting: Internal Medicine

## 2018-04-25 ENCOUNTER — Observation Stay (HOSPITAL_COMMUNITY)
Admission: AD | Admit: 2018-04-25 | Discharge: 2018-04-26 | Disposition: A | Discharge status: Home / Self Care | Payer: BLUE CROSS/BLUE SHIELD | Source: Ambulatory Visit | Attending: Internal Medicine | Admitting: Internal Medicine

## 2018-04-25 ENCOUNTER — Encounter (HOSPITAL_COMMUNITY): Payer: Self-pay | Admitting: General Practice

## 2018-04-25 ENCOUNTER — Other Ambulatory Visit: Payer: Self-pay

## 2018-04-25 VITALS — BP 147/87 | HR 81 | Temp 99.1°F | Wt 135.8 lb

## 2018-04-25 DIAGNOSIS — D509 Iron deficiency anemia, unspecified: Secondary | ICD-10-CM

## 2018-04-25 DIAGNOSIS — G4459 Other complicated headache syndrome: Secondary | ICD-10-CM

## 2018-04-25 DIAGNOSIS — Z79899 Other long term (current) drug therapy: Secondary | ICD-10-CM | POA: Diagnosis not present

## 2018-04-25 DIAGNOSIS — Z886 Allergy status to analgesic agent status: Secondary | ICD-10-CM

## 2018-04-25 DIAGNOSIS — I10 Essential (primary) hypertension: Secondary | ICD-10-CM | POA: Diagnosis not present

## 2018-04-25 DIAGNOSIS — R519 Headache, unspecified: Secondary | ICD-10-CM

## 2018-04-25 DIAGNOSIS — R51 Headache: Principal | ICD-10-CM | POA: Insufficient documentation

## 2018-04-25 DIAGNOSIS — Z66 Do not resuscitate: Secondary | ICD-10-CM

## 2018-04-25 HISTORY — DX: Headache, unspecified: R51.9

## 2018-04-25 HISTORY — DX: Headache: R51

## 2018-04-25 HISTORY — DX: Claustrophobia: F40.240

## 2018-04-25 HISTORY — DX: Cerebral infarction, unspecified: I63.9

## 2018-04-25 LAB — CBC WITH DIFFERENTIAL/PLATELET
Abs Immature Granulocytes: 0.01 10*3/uL (ref 0.00–0.07)
BASOS ABS: 0 10*3/uL (ref 0.0–0.1)
Basophils Relative: 0 %
Eosinophils Absolute: 0.1 10*3/uL (ref 0.0–0.5)
Eosinophils Relative: 2 %
HEMATOCRIT: 33.5 % — AB (ref 36.0–46.0)
HEMOGLOBIN: 10.2 g/dL — AB (ref 12.0–15.0)
IMMATURE GRANULOCYTES: 0 %
LYMPHS ABS: 2.1 10*3/uL (ref 0.7–4.0)
LYMPHS PCT: 33 %
MCH: 22.1 pg — ABNORMAL LOW (ref 26.0–34.0)
MCHC: 30.4 g/dL (ref 30.0–36.0)
MCV: 72.7 fL — ABNORMAL LOW (ref 80.0–100.0)
Monocytes Absolute: 0.4 10*3/uL (ref 0.1–1.0)
Monocytes Relative: 7 %
NEUTROS ABS: 3.6 10*3/uL (ref 1.7–7.7)
NEUTROS PCT: 58 %
NRBC: 0 % (ref 0.0–0.2)
Platelets: 309 10*3/uL (ref 150–400)
RBC: 4.61 MIL/uL (ref 3.87–5.11)
RDW: 14.6 % (ref 11.5–15.5)
WBC: 6.3 10*3/uL (ref 4.0–10.5)

## 2018-04-25 MED ORDER — FERROUS SULFATE 325 (65 FE) MG PO TABS
325.0000 mg | ORAL_TABLET | Freq: Every day | ORAL | Status: DC
  Administered 2018-04-26: 325 mg via ORAL
  Filled 2018-04-25 (×2): qty 1

## 2018-04-25 MED ORDER — CARBAMAZEPINE ER 200 MG PO CP12: 200.0000 mg | ORAL_CAPSULE | Freq: Two times a day (BID) | ORAL | 1 refills | Status: DC

## 2018-04-25 MED ORDER — ACETAMINOPHEN 500 MG PO TABS: 1000.0000 mg | ORAL_TABLET | Freq: Four times a day (QID) | ORAL | Status: DC | PRN

## 2018-04-25 MED ORDER — TRAMADOL HCL 50 MG PO TABS
50.0000 mg | ORAL_TABLET | Freq: Four times a day (QID) | ORAL | Status: DC | PRN
  Administered 2018-04-25 – 2018-04-26 (×4): 50 mg via ORAL
  Filled 2018-04-25 (×4): qty 1

## 2018-04-25 MED ORDER — CARBAMAZEPINE ER 200 MG PO TB12
200.0000 mg | ORAL_TABLET | Freq: Two times a day (BID) | ORAL | Status: DC
  Administered 2018-04-25 – 2018-04-26 (×2): 200 mg via ORAL
  Filled 2018-04-25 (×3): qty 1

## 2018-04-25 MED ORDER — ENOXAPARIN SODIUM 40 MG/0.4ML ~~LOC~~ SOLN
40.0000 mg | SUBCUTANEOUS | Status: DC
  Administered 2018-04-25: 40 mg via SUBCUTANEOUS
  Filled 2018-04-25 (×2): qty 0.4

## 2018-04-25 MED ORDER — PANTOPRAZOLE SODIUM 40 MG PO TBEC
40.0000 mg | DELAYED_RELEASE_TABLET | Freq: Every day | ORAL | Status: DC
  Administered 2018-04-26: 40 mg via ORAL
  Filled 2018-04-25: qty 1

## 2018-04-25 MED ORDER — ONDANSETRON HCL 4 MG PO TABS: 4.0000 mg | ORAL_TABLET | Freq: Four times a day (QID) | ORAL | Status: DC | PRN

## 2018-04-25 MED ORDER — AMLODIPINE BESYLATE 2.5 MG PO TABS
2.5000 mg | ORAL_TABLET | Freq: Every day | ORAL | Status: DC
  Administered 2018-04-26: 2.5 mg via ORAL
  Filled 2018-04-25: qty 1

## 2018-04-25 MED ORDER — ONDANSETRON HCL 4 MG/2ML IJ SOLN
4.0000 mg | Freq: Four times a day (QID) | INTRAMUSCULAR | Status: DC | PRN
  Administered 2018-04-26: 4 mg via INTRAVENOUS
  Filled 2018-04-25: qty 2

## 2018-04-25 MED FILL — CARBAMAZEPINE ER 200 MG CAP: 200 | 30 days supply | Qty: 60 | Fill #0

## 2018-04-25 NOTE — Assessment & Plan Note (Addendum)
This problem is not new but is uncontrolled.  The HA started in early October and has been continuous since. The pain is constant, 24 hrs, and affects her ability to sleep. It is from above L eye, temple, to above L ear.  It is continuous, 24 hours a day but sometimes it worsens.  She describes it as electrical shocks or a sharp knifelike pain.  Inserting her Bluetooth into that ear, talking, chewing, or yawning all will exacerbate the pain.  She denies any other associated symptoms.  The initial concern was temporal arteritis and she was started on prednisone 60 mg.  She states this helped some as the pain was not quite as sharp but was still constant.  Her sed rate was elevated at 66.  She had a temporal artery biopsy which was 3.6 cm and was negative.  She was then tapered off the prednisone and she stated the pain increased a little bit.  Dr. Philipp Ovens has been trying to get her an MRI all month.  She was not able to tolerate a closed MRI here at [facility] despite first of Ativan and next with Valium.  We then got her scheduled for an open MRI pretreated with Valium 5 mg x 2 that she was not able to tolerate it yesterday.  My differential diagnosis includes temporal arteritis.  She had a long temporal biopsy but there are skip lesions which resulted in false negative biopsy results.  If the pretest probability is high, then given the contralateral temporal artery biopsy is indicated.  I am not convinced that she has a high pretest probability because usually, there is rapid improvement in symptoms with prednisone.  She only got some improvement in her symptoms.  I am going to start with a repeat sed rate before discussing with vascular surgery whether she needs a contralateral temporal artery biopsy.  Trigeminal neuralgia is also on my differential although this usually is not constant pain.  Additionally, her distribution involves V1, V2, V3.  And she does not have any rhinorrhea or eye discharge which could  be more consistent with trigeminal neuralgia.  However, the description of her pain does not sit.  She got no relief with gabapentin so I am going to stop the gabapentin and trial carbamazepine 200 mg twice daily.  I am also referring her to neurology.  Since this is a new headache pattern in a 57 year old woman, I need imaging.  MRI would be better but a CT of the head.  She has been unable to tolerate even an open MRI.  [facility] does do MRIs with heavy sedation but only 2 days a week and they are scheduled out until December.  I do not feel that it is safe to wait until December to get her MRI so we are admitting her to get a head MRI with heavy sedation.  She agrees to this plan.  PLAN : Sed rate Stop gabapentin Carbamazepine 200 twice daily Admit for an inpatient MRI with heavy sedation Neurology referral

## 2018-04-25 NOTE — Progress Notes (Signed)
Pt telemetry discontinued and removed from pt. Delia Heady RN

## 2018-04-25 NOTE — Progress Notes (Signed)
Pt arrived to the unit as a direct admit from home after MD office visit. Pt A&O x4; oriented to the unit and room; fall/safety precaution and prevention education completed. Pt skin clean, dry and intact with no pressure ulcer or opened wound noted except for healed site to right temporal d/t biopsy per pt. MD in at bedside; VSS: telemetry applied and verified with CCMD; NT called to second verify. IV started to right AC. Pt MAE x4; bed alarm on and call light within reach. Will continue to closely monitor and await on orders from MD. Delia Heady RN    04/25/18 1643  Vital Signs  BP (!) 138/93  BP Location Left Arm  Patient Position (if appropriate) Lying  BP Method Automatic  Pulse Rate 87  Pulse Rate Source Monitor  Resp 18  Temp 98.4 F (36.9 C)  Temp Source Oral  Oxygen Therapy  SpO2 98 %  O2 Device Room Air

## 2018-04-25 NOTE — Patient Instructions (Signed)
1. See me in 2-4 weeks

## 2018-04-25 NOTE — H&P (Signed)
Date: 04/25/2018               Patient Name:  [name] MRN: [id number]  DOB: 04-Oct-1960 Age / Sex: 57 y.o., female   PCP: Bartholomew Crews, MD         Medical Service: Internal Medicine Teaching Service         Attending Physician: Dr. Lucious Groves, DO    First Contact: Dr. Koleen Distance Pager: [phone]  Second Contact: Dr. Tarri Abernethy Pager: [phone]       After Hours (After 5p/  First Contact Pager: [phone]  weekends / holidays): Second Contact Pager: [phone]   Chief Complaint: Headaches  History of Present Illness: Ms. Ohlinger is a 57 y.o female with microcytic anemia who was admitted from the Internal Medicine Residency with concerns of right-sided temporal headache of 4 weeks duration. Per the patient her headaches started earlier this month while she was at work. It came on suddenly, specifically in the right temporal region. She describes the pain as a sharp/stabbing in nature. She states that it is constant but does seem to get worse with pressure or randomly. These acute attacks do not tend to last longer than 10-15 minutes before returning to her baseline pain. She has tried OTC medications such as tylenol and NSAIDs without relief. She was prescribed prednisone, discussed below, but states that this only minorly helped. She then tried gabapentin without relief and today was switched to carbamazepine. She has not noticed any associated symptoms including vision loss, pain with extraocular eye movements, rhinorrhea, lack cremation, hearing changes, new numbness/tingling, gait disturbances, new weakness. ROS specifically negative for myalgias, arthralgias, new rash, fevers, chills, N/V, positional changes, chest pain, claudication symptoms, SOB.   Per chart review she was initially evaluated on 10/08 in the clinic. At the time there was concern for GCA vs trigeminal neuralgia. Gabapentin was initiated and SED rate obtained. The patient's SED rate came back elevated at 66  and she was subsequently started on prednisone and referred to vascular surgery for temporal artery biopsy. He pain minimally improved on the prednisone. Biopsy was obtained on 10/16 and subsequently returned negative for giant cell arteritis. The prednisone was stopped after tapering. MRI was ordered but the patient was unable to get it on 2 separate attempts. Given the severity of her headache and the need for MRI, she was subsequently admitted for MRI with anesthesia.   Meds:  No current facility-administered medications on file prior to encounter.    Current Outpatient Medications on File Prior to Encounter  Medication Sig Dispense Refill  . acetaminophen (TYLENOL) 500 MG tablet Take 1,000 mg by mouth every 6 (six) hours as needed for moderate pain or headache.    . AMBULATORY NON FORMULARY MEDICATION Medication Name: nitroglycerin 0.125 mg ointment mixed with 2% lidocaine three times a day for 6-8 weeks (Patient not taking: Reported on 04/09/2018) 1 Tube 1  . amLODipine (NORVASC) 2.5 MG tablet Take 1 tablet (2.5 mg total) by mouth daily. 90 tablet 1  . carbamazepine (CARBATROL) 200 MG 12 hr capsule Take 1 capsule (200 mg total) by mouth 2 (two) times daily. 60 capsule 1  . diazepam (VALIUM) 5 MG tablet Take one before MRI and take second if needed 2 tablet 0  . ferrous sulfate (IRON SUPPLEMENT) 325 (65 FE) MG tablet Take 1 tablet (325 mg total) by mouth daily with breakfast. 30 tablet 3  . gabapentin (NEURONTIN) 100 MG capsule Take 1 capsule (100 mg  total) by mouth 2 (two) times daily. (Patient not taking: Reported on 04/09/2018) 60 capsule 0  . hydrocortisone (ANUSOL-HC) 2.5 % rectal cream Place 1 application rectally 2 (two) times daily. (Patient not taking: Reported on 04/09/2018) 30 g 1  . lisinopril (PRINIVIL,ZESTRIL) 20 MG tablet Take 1 tablet (20 mg total) by mouth daily. 90 tablet 1  . omeprazole (PRILOSEC) 20 MG capsule TAKE 1 CAPSULE BY MOUTH ONCE DAILY (Patient taking differently:  Take 20 mg by mouth daily. ) 90 capsule 3  . predniSONE (DELTASONE) 20 MG tablet Take 3 tablets (60 mg total) by mouth daily with breakfast. 90 tablet 0  . traMADol (ULTRAM) 50 MG tablet Take 1 tablet (50 mg total) by mouth every 6 (six) hours as needed for up to 12 doses. 20 tablet 0   Allergies: Allergies as of 04/25/2018 - Review Complete 04/25/2018  Allergen Reaction Noted  . Aspirin Hives   . Hydromorphone hcl Hives and Nausea And Vomiting    Past Medical History:  Diagnosis Date  . Anemia   . Arthritis   . GERD (gastroesophageal reflux disease)   . Hiatal hernia   . Hyperlipidemia   . Hypertension   . Plantar fasciitis    Family History:  + RA (mother), SLE (cousin)   Social History:  Works as a Electrical engineer  Denies use of illicit substance, EtOH, tobacco   Review of Systems: A complete ROS was negative except as per HPI.   Physical Exam: Blood pressure (!) 138/93, pulse 87, temperature 98.4 F (36.9 C), temperature source Oral, resp. rate 18, SpO2 98 %.  General: Well nourished female in no acute distress HENT: Normocephalic, atraumatic, moist mucus membranes, TM clear with good light reflexes bilaterally, external ear canals without vesicles, excoriations, or erythema. No lacrimation, no rhinorrhea. Ophthalmoscope preformed without evidence of papilla edema. Tenderness to palpation of the right temporal region.   Pulm: Good air movement with no wheezing or crackles  CV: RRR, no murmurs, no rubs  Abdomen: Active bowel sounds, soft, non-distended, no tenderness to palpation  Extremities: Pulses palpable in all extremities, no LE edema  Skin: Warm and dry  Neuro: Alert and oriented x 3, cranial nerves 2-12 intact bilaterally, gross strength 5/5 in all extremities, sensation to light touch intact.   Assessment & Plan by Problem: Active Problems:   Headache  Ms. Nelon is a 57 year old female being evaluated for new, uncontrolled right temporal headaches. She has  had a right temporal artery biopsy within the past month, and pathology was negative for signs of giants arteritis. However she continues to have uncontrolled headaches that are affecting her daily life. Given the severity of her headache and the need for MRI, she was subsequently admitted for MRI with anesthesia.   Intractable Right Temporal Headaches. Patient's headaches are not consistent with a primary headache disorder including migraine, cluster, and/or tension headaches. Her presentation is most concerning for secondary headaches. I do not suspect infectious or exposure related etiologies at this point. I agree that temporal arteries remains on the differential. Based on PE (no focal neuro deficits, no papilla-edema) and lack of increased ICP symptoms I feel a mass occupying or anatomical cause of her headaches are less likely; however, an MRI will help use to assess for other abnormalities. Also, as mentioned in Dr. Zenovia Jarred, trigeminal neuralgia is on the differential and we will start carbamazepine. - Obtain MRI of brain with sedation - Follow-up SED rate  - CBC with diff - Continue carbamazepine for  possible trigeminal neuralgia     HTN - Continue Amlodipine   Microcytic Anemia  - Hgb stable  - Outpatient iron studies.   Diet: Regular, NPO @ midnight  VTE ppx: Lovenox  CODE STATUS: DNR  Dispo: Admit patient to Observation with expected length of stay less than 2 midnights.  Signed: Ina Homes, MD 04/25/2018, 5:18 PM

## 2018-04-25 NOTE — Progress Notes (Signed)
   Subjective:    Patient ID: [name], female    DOB: 07-19-1960, 57 y.o.   MRN: [id number]  HPI  [name] is here for HA F/U. Please see the A&P for the status of the pt's chronic medical problems.  ROS : per ROS section and in problem oriented charting. All other systems are negative.  PMHx, Soc hx, and / or Fam hx : Cannot work due to pain. Has FMLA paperwork   Review of Systems  Constitutional: Negative for fever.  HENT: Negative for rhinorrhea.   Eyes: Negative for pain and discharge.  Gastrointestinal: Negative for nausea and vomiting.  Neurological:       Decreased sensation R face  Psychiatric/Behavioral: Positive for sleep disturbance.       Objective:   Physical Exam  Constitutional: She appears well-developed and well-nourished.  Non-toxic appearance. She does not appear ill. She appears distressed.  tearful  HENT:  Head: Normocephalic and atraumatic.  Well healed incision pre-auricular on L  Eyes: Conjunctivae and EOM are normal. Right eye exhibits no discharge. Left eye exhibits no discharge. No scleral icterus. Right eye exhibits normal extraocular motion. Left eye exhibits normal extraocular motion.  Neck: Normal range of motion. Neck supple.  Fullness L submandibular area  Neurological:  Decreased sensation to R forehead, check, and mandible   Psychiatric: She has a normal mood and affect. Her behavior is normal. Judgment and thought content normal.      Assessment & Plan:

## 2018-04-26 ENCOUNTER — Observation Stay (HOSPITAL_COMMUNITY): Payer: BLUE CROSS/BLUE SHIELD | Admitting: Anesthesiology

## 2018-04-26 ENCOUNTER — Observation Stay (HOSPITAL_COMMUNITY): Payer: BLUE CROSS/BLUE SHIELD

## 2018-04-26 ENCOUNTER — Encounter (HOSPITAL_COMMUNITY)
Admission: AD | Disposition: A | Discharge status: Home / Self Care | Payer: Self-pay | Source: Ambulatory Visit | Attending: Internal Medicine

## 2018-04-26 ENCOUNTER — Encounter (HOSPITAL_COMMUNITY): Payer: Self-pay | Admitting: Orthopedic Surgery

## 2018-04-26 DIAGNOSIS — Z885 Allergy status to narcotic agent status: Secondary | ICD-10-CM

## 2018-04-26 DIAGNOSIS — I10 Essential (primary) hypertension: Secondary | ICD-10-CM | POA: Diagnosis not present

## 2018-04-26 DIAGNOSIS — R51 Headache: Secondary | ICD-10-CM | POA: Diagnosis not present

## 2018-04-26 DIAGNOSIS — Z79899 Other long term (current) drug therapy: Secondary | ICD-10-CM | POA: Diagnosis not present

## 2018-04-26 DIAGNOSIS — G4459 Other complicated headache syndrome: Secondary | ICD-10-CM | POA: Diagnosis not present

## 2018-04-26 DIAGNOSIS — D509 Iron deficiency anemia, unspecified: Secondary | ICD-10-CM | POA: Diagnosis not present

## 2018-04-26 HISTORY — PX: RADIOLOGY WITH ANESTHESIA: SHX6223

## 2018-04-26 LAB — HIV ANTIBODY (ROUTINE TESTING W REFLEX): HIV Screen 4th Generation wRfx: NONREACTIVE

## 2018-04-26 LAB — SEDIMENTATION RATE: SED RATE: 30 mm/h (ref 0–40)

## 2018-04-26 SURGERY — MRI WITH ANESTHESIA
Anesthesia: General

## 2018-04-26 MED ORDER — FENTANYL CITRATE (PF) 100 MCG/2ML IJ SOLN
INTRAMUSCULAR | Status: AC
  Administered 2018-04-26: 25 ug via INTRAVENOUS
  Filled 2018-04-26: qty 2

## 2018-04-26 MED ORDER — GADOBUTROL 1 MMOL/ML IV SOLN
6.0000 mL | Freq: Once | INTRAVENOUS | Status: AC | PRN
  Administered 2018-04-26: 6 mL via INTRAVENOUS

## 2018-04-26 MED ORDER — LIDOCAINE 2% (20 MG/ML) 5 ML SYRINGE
INTRAMUSCULAR | Status: DC | PRN
  Administered 2018-04-26: 60 mg via INTRAVENOUS

## 2018-04-26 MED ORDER — PROMETHAZINE HCL 25 MG/ML IJ SOLN: 6.2500 mg | INTRAMUSCULAR | Status: DC | PRN

## 2018-04-26 MED ORDER — ROCURONIUM BROMIDE 50 MG/5ML IV SOSY
PREFILLED_SYRINGE | INTRAVENOUS | Status: DC | PRN
  Administered 2018-04-26: 30 mg via INTRAVENOUS

## 2018-04-26 MED ORDER — PROPOFOL 10 MG/ML IV BOLUS
INTRAVENOUS | Status: DC | PRN
  Administered 2018-04-26: 150 mg via INTRAVENOUS

## 2018-04-26 MED ORDER — DEXAMETHASONE SODIUM PHOSPHATE 10 MG/ML IJ SOLN
INTRAMUSCULAR | Status: DC | PRN
  Administered 2018-04-26: 10 mg via INTRAVENOUS

## 2018-04-26 MED ORDER — ONDANSETRON HCL 4 MG/2ML IJ SOLN
INTRAMUSCULAR | Status: DC | PRN
  Administered 2018-04-26: 4 mg via INTRAVENOUS

## 2018-04-26 MED ORDER — FENTANYL CITRATE (PF) 100 MCG/2ML IJ SOLN
25.0000 ug | INTRAMUSCULAR | Status: DC | PRN
  Administered 2018-04-26 (×2): 25 ug via INTRAVENOUS

## 2018-04-26 MED ORDER — LACTATED RINGERS IV SOLN
INTRAVENOUS | Status: DC
  Administered 2018-04-26: 14:00:00 via INTRAVENOUS

## 2018-04-26 MED ORDER — MIDAZOLAM HCL 5 MG/5ML IJ SOLN
INTRAMUSCULAR | Status: DC | PRN
  Administered 2018-04-26: 2 mg via INTRAVENOUS

## 2018-04-26 MED ORDER — FENTANYL CITRATE (PF) 100 MCG/2ML IJ SOLN
INTRAMUSCULAR | Status: DC | PRN
  Administered 2018-04-26: 100 ug via INTRAVENOUS

## 2018-04-26 MED ORDER — SUGAMMADEX SODIUM 200 MG/2ML IV SOLN
INTRAVENOUS | Status: DC | PRN
  Administered 2018-04-26: 200 mg via INTRAVENOUS

## 2018-04-26 MED ORDER — ONDANSETRON HCL 4 MG/2ML IJ SOLN: 4.0000 mg | Freq: Once | INTRAMUSCULAR | Status: DC | PRN

## 2018-04-26 MED ORDER — LACTATED RINGERS IV SOLN
INTRAVENOUS | Status: DC | PRN
  Administered 2018-04-26: 14:00:00 via INTRAVENOUS

## 2018-04-26 NOTE — Anesthesia Preprocedure Evaluation (Addendum)
Anesthesia Evaluation  Patient identified by MRN, date of birth, ID band Patient awake    Reviewed: Allergy & Precautions, NPO status , Patient's Chart, lab work & pertinent test results  History of Anesthesia Complications Negative for: history of anesthetic complications  Airway Mallampati: II  TM Distance: >3 FB Neck ROM: Full    Dental  (+) Chipped,    Pulmonary Current Smoker,    Pulmonary exam normal breath sounds clear to auscultation       Cardiovascular hypertension, Pt. on medications Normal cardiovascular exam Rhythm:Regular Rate:Normal  ECG: NSR, rate 77   Neuro/Psych  Headaches, Anxiety TIA (2015)CVA, No Residual Symptoms    GI/Hepatic hiatal hernia, GERD  Medicated and Controlled,  Endo/Other  negative endocrine ROS  Renal/GU negative Renal ROS     Musculoskeletal  (+) Arthritis ,   Abdominal   Peds  Hematology  (+) anemia ,   Anesthesia Other Findings   Reproductive/Obstetrics                            Anesthesia Physical Anesthesia Plan  ASA: III  Anesthesia Plan: General   Post-op Pain Management:    Induction: Intravenous  PONV Risk Score and Plan: 2 and Treatment may vary due to age or medical condition, Ondansetron, Dexamethasone and Midazolam  Airway Management Planned: Oral ETT  Additional Equipment:   Intra-op Plan:   Post-operative Plan: Extubation in OR  Informed Consent: I have reviewed the patients History and Physical, chart, labs and discussed the procedure including the risks, benefits and alternatives for the proposed anesthesia with the patient or authorized representative who has indicated his/her understanding and acceptance.   Dental advisory given  Plan Discussed with: CRNA  Anesthesia Plan Comments:        Anesthesia Quick Evaluation

## 2018-04-26 NOTE — Anesthesia Procedure Notes (Signed)
Procedure Name: Intubation Date/Time: 04/26/2018 2:55 PM Performed by: Neldon Newport, CRNA Pre-anesthesia Checklist: Timeout performed, Patient being monitored, Suction available, Emergency Drugs available and Patient identified Patient Re-evaluated:Patient Re-evaluated prior to induction Oxygen Delivery Method: Circle system utilized Preoxygenation: Pre-oxygenation with 100% oxygen Induction Type: IV induction Ventilation: Mask ventilation without difficulty Laryngoscope Size: Mac and 3 Grade View: Grade I Tube type: Oral Tube size: 6.5 mm Number of attempts: 1 Placement Confirmation: breath sounds checked- equal and bilateral,  positive ETCO2 and ETT inserted through vocal cords under direct vision Secured at: 20 cm Tube secured with: Tape Dental Injury: Teeth and Oropharynx as per pre-operative assessment

## 2018-04-26 NOTE — Progress Notes (Addendum)
Patient d/c at the said time. D/c instructions given and all questions answered. EDUCATED ON THE NEED TO BE COMPLIANT WITH HER PRESCRIPTION.  ASSESSMENTS REMAINED UNCHANGED PRIOR TO D/C

## 2018-04-26 NOTE — Progress Notes (Signed)
   Subjective: [name] was seen and evaluated at bedside on morning rounds. No acute events overnight. Continues to have constant, sharp pain in right temporal region. She felt nauseated this morning after taking Carbamazepine, but improved with zofran. No change in her headache since switching to new medication. Endorses hyperesthesias to affected area, but denies jaw claudication, photophobia, vision changes, lacrimation, rhinorrhea, numbness/tingling anywhere else or weakness.   Objective:  Vital signs in last 24 hours: Vitals:   04/25/18 1643 04/25/18 2023 04/26/18 0102 04/26/18 0459  BP: (!) 138/93 (!) 160/96 130/90 134/86  Pulse: 87 87 69 65  Resp: 18 18 18 18   Temp: 98.4 F (36.9 C) 98 F (36.7 C) (!) 97.5 F (36.4 C) 97.9 F (36.6 C)  TempSrc: Oral Oral Oral Oral  SpO2: 98% 100% 99% 100%   General: awake, alert, pleasant female, lying in bed in NAD HEENT: non-injected conjunctiva. Oropharynx without erythema or vesicular lesions.  CV: RRR; no murmurs, rubs or gallops Pulm: normal respiratory effort; lungs CTA bilaterally  Neuro: cranial nerves grossly intact. No focal weakness. Sensation intact throughout.    Assessment/Plan:  Active Problems:   Headache  [name] is a 57 y/o female that presents with a new-onset right temporal headache that has persisted for 4 weeks with associated hyperesthesia to affected area. Outpatient work-up done for GCA including temporal artery biopsy which was negative. Imaging with MRI is needed to further evaluate, but patient is not able to tolerate due to severe anxiety. She was therefore admitted to undergo MRI with sedation.   1. Intractable right temporal headache. She is getting MRI this afternoon to rule out anatomic causes such as mass or venous thrombosis but feel this is less likely due to lack of other signs or symptoms. Currently treating for possible trigeminal neuralgia with Carbamazepine. Differential also includes  hemicrania continua or new daily persistent headache. Less likely SUNCT or SUNA given lack of autonomic symptoms. Description of her headache is consistent with primary stabbing headache, except patients typically have intermittent relief. She has an appointment with neurology on Monday who will hopefully be able to evaluate and treat if she still does not have relief.   Dispo: Anticipated discharge in approximately 1 day pending no acute findings on MRI   Delice Bison, DO 04/26/2018, 6:58 AM Pager: [phone]

## 2018-04-26 NOTE — Plan of Care (Signed)
Patient stable, discussed POC with patient, agreeable with plan, denies question/concerns at this time.  

## 2018-04-26 NOTE — Anesthesia Postprocedure Evaluation (Signed)
Anesthesia Post Note  Patient: [name]  Procedure(s) Performed: MRI WITH ANESTHESIA (N/A )     Patient location during evaluation: PACU Anesthesia Type: General Level of consciousness: awake and alert Pain management: pain level controlled Vital Signs Assessment: post-procedure vital signs reviewed and stable Respiratory status: spontaneous breathing, nonlabored ventilation, respiratory function stable and patient connected to nasal cannula oxygen Cardiovascular status: blood pressure returned to baseline and stable Postop Assessment: no apparent nausea or vomiting Anesthetic complications: no    Last Vitals:  Vitals:   04/26/18 1645 04/26/18 1706  BP:  (!) 144/95  Pulse:  76  Resp:    Temp: (!) 36.4 C 36.9 C  SpO2:  95%    Last Pain:  Vitals:   04/26/18 1706  TempSrc: Oral  PainSc: 8                  Ryan P Ellender

## 2018-04-26 NOTE — Discharge Summary (Signed)
Name: [name] MRN: [id number] DOB: 07-Aug-1960 57 y.o. PCP: Bartholomew Crews, MD  Date of Admission: 04/25/2018  4:13 PM Date of Discharge: 04/26/2018 Attending Physician: Oda Kilts, MD  Discharge Diagnosis: 1. Intractable right temporal headache   Discharge Medications: Allergies as of 04/26/2018      Reactions   Aspirin Hives   Hydromorphone Hcl Hives, Nausea And Vomiting      Medication List    STOP taking these medications   predniSONE 20 MG tablet Commonly known as:  DELTASONE     TAKE these medications   acetaminophen 500 MG tablet Commonly known as:  TYLENOL Take 1,000 mg by mouth every 6 (six) hours as needed for moderate pain or headache.   AMBULATORY NON FORMULARY MEDICATION Medication Name: nitroglycerin 0.125 mg ointment mixed with 2% lidocaine three times a day for 6-8 weeks   amLODipine 2.5 MG tablet Commonly known as:  NORVASC Take 1 tablet (2.5 mg total) by mouth daily.   carbamazepine 200 MG 12 hr capsule Commonly known as:  CARBATROL Take 1 capsule (200 mg total) by mouth 2 (two) times daily.   diazepam 5 MG tablet Commonly known as:  VALIUM Take one before MRI and take second if needed   ferrous sulfate 325 (65 FE) MG tablet Take 1 tablet (325 mg total) by mouth daily with breakfast.   gabapentin 100 MG capsule Commonly known as:  NEURONTIN Take 1 capsule (100 mg total) by mouth 2 (two) times daily.   hydrocortisone 2.5 % rectal cream Commonly known as:  ANUSOL-HC Place 1 application rectally 2 (two) times daily.   lisinopril 20 MG tablet Commonly known as:  PRINIVIL,ZESTRIL Take 1 tablet (20 mg total) by mouth daily.   omeprazole 20 MG capsule Commonly known as:  PRILOSEC TAKE 1 CAPSULE BY MOUTH ONCE DAILY   traMADol 50 MG tablet Commonly known as:  ULTRAM Take 1 tablet (50 mg total) by mouth every 6 (six) hours as needed for up to 12 doses.       Disposition and follow-up:   [name] was  discharged from [facility] in Good condition.  At the hospital follow up visit please address:  1.  Intractable right temporal headache: Unclear etiology based on work-up thus far. Admitted to undergo MRI with sedation which ruled out vascular or malignant etiologies for the headache. She was started on Carbamazepine to treat possible trigeminal neuralgia. Please evaluate for any improvement in symptoms. She is scheduled for neurology appointment on 11/4 for further work-up and treatment recommendations.   2.  Labs / imaging needed at time of follow-up: none  3.  Pending labs/ test needing follow-up: none   Follow-up Appointments: 04/29/2018 with neurology    Hospital Course by problem list: [name] is a 57 y/o female with PMHx HTN that presented with a new-onset right temporal headache that has persisted for 4 weeks with associated hyperesthesia to affected area. Outpatient work-up done for GCA including temporal artery biopsy which was negative. Imaging with MRI is needed to further evaluate, but patient is not able to tolerate due to severe anxiety. She was therefore admitted to undergo MRI with sedation.  1. Intractable right temporal headache: MRI was negative for mass or vascular abnormality to explain patient's symptoms. She was continued on Carbamazepine for treatment of possible trigeminal neuralgia. Other possible etiologies include hemicrania continua or new daily persistent headache. She has been referred to outpatient neurology for further evaluation on 11/4.  Discharge Vitals:   BP (!) 144/95 (BP Location: Left Arm)   Pulse 76   Temp 98.4 F (36.9 C) (Oral)   Resp 12   Ht 4' 11.02" (1.499 m)   Wt 61.6 kg   SpO2 95%   BMI 27.41 kg/m   Pertinent Labs, Studies, and Procedures:  MRI Brain with contrast  IMPRESSION: 1.  No acute intracranial abnormality. 2. Stable since 2015 and largely unremarkable for age MRI appearance of the brain. 3. Symmetric  nasal cavity mucosal thickening raising the possibility of Rhinitis.  Discharge Instructions: Discharge Instructions    Call MD for:  difficulty breathing, headache or visual disturbances   Complete by:  As directed    Call MD for:  extreme fatigue   Complete by:  As directed    Call MD for:  persistant dizziness or light-headedness   Complete by:  As directed    Call MD for:  persistant nausea and vomiting   Complete by:  As directed    Call MD for:  severe uncontrolled pain   Complete by:  As directed    Diet - low sodium heart healthy   Complete by:  As directed    Discharge instructions   Complete by:  As directed    [name],  It was a pleasure taking care of you in the hospital.  The MRI which took of your brain did not show any acute findings such as a mass or any abnormality with your veins or blood vessels.  All like for you to follow-up with your neurologist on Monday.  Please continue all your medications including the carbamazepine.  Next  ~Take Care Dr. Eileen Bauer   Increase activity slowly   Complete by:  As directed       Signed: Delice Bison, DO 04/27/2018, 6:44 AM

## 2018-04-26 NOTE — Transfer of Care (Signed)
Immediate Anesthesia Transfer of Care Note  Patient: [name]  Procedure(s) Performed: MRI WITH ANESTHESIA (N/A )  Patient Location: PACU  Anesthesia Type:General  Level of Consciousness: awake, alert  and oriented  Airway & Oxygen Therapy: Patient Spontanous Breathing and Patient connected to nasal cannula oxygen  Post-op Assessment: Report given to RN, Post -op Vital signs reviewed and stable and Patient moving all extremities X 4  Post vital signs: Reviewed and stable  Last Vitals:  Vitals Value Taken Time  BP 167/98 04/26/2018  4:02 PM  Temp 36.5 C 04/26/2018  4:00 PM  Pulse 79 04/26/2018  4:08 PM  Resp 17 04/26/2018  4:08 PM  SpO2 99 % 04/26/2018  4:08 PM  Vitals shown include unvalidated device data.  Last Pain:  Vitals:   04/26/18 1600  TempSrc:   PainSc: 8       Patients Stated Pain Goal: 3 (57/49/35 [id number])  Complications: No apparent anesthesia complications

## 2018-04-28 ENCOUNTER — Telehealth: Payer: Self-pay | Admitting: Internal Medicine

## 2018-04-28 ENCOUNTER — Other Ambulatory Visit: Payer: Self-pay | Admitting: Internal Medicine

## 2018-04-28 MED ORDER — CARBAMAZEPINE ER 200 MG PO CP12: 200.0000 mg | ORAL_CAPSULE | Freq: Two times a day (BID) | ORAL | 0 refills | Status: DC

## 2018-04-28 NOTE — Telephone Encounter (Signed)
   Reason for call:   I received a call from Ms. [name] at 10 AM indicating that she was having a headache. She described it as constant, associated with nausea, and located on the R side of her head. She denied recent illness, fever, chills, vision changes, dizziness, weakness, and falls. She has been taking Tramadol with no improvement in symptoms. Tylenol and NSAIDs have not work for her in the past, she had not tried these.    She was recently prescribed carbamazepine as below which she states was helping with the HAs. However, she was not able to pick the medication up at [place] when she was discharged from [facility] on Friday afternoon.    Pertinent Data:   Ms. Knoch was recently admitted 10/31-11/1 for MRI brain under sedation for evaluation of intractable R temporal. This study was normal. Symptoms were thought to be secondary to trigeminal neuralgia and she was started on carbamazepine with improvement in headache and was sent home with outpatient neurology follow up which is scheduled for tomorrow morning at 9AM.    Assessment / Plan / Recommendations:   Intractable HA associated with nausea possibly secondary to trigeminal neuralgia.   Sent prescription for Carbamazepine 200 mg BID (10 tablets) to [facility] at [facility]. Advised her to follow up with neurology tomorrow.   As always, pt is advised that if symptoms worsen or new symptoms arise, they should go to an urgent care facility or to to ER for further evaluation.   Welford Roche, MD   04/28/2018, 2:09 PM

## 2018-04-29 ENCOUNTER — Telehealth: Payer: Self-pay | Admitting: Neurology

## 2018-04-29 ENCOUNTER — Ambulatory Visit: Payer: BLUE CROSS/BLUE SHIELD | Admitting: Neurology

## 2018-04-29 ENCOUNTER — Encounter (HOSPITAL_COMMUNITY): Payer: Self-pay | Admitting: Radiology

## 2018-04-29 ENCOUNTER — Encounter: Payer: Self-pay | Admitting: Neurology

## 2018-04-29 NOTE — Telephone Encounter (Signed)
This patient canceled the same day of a new patient appointment.

## 2018-05-08 ENCOUNTER — Other Ambulatory Visit: Payer: Self-pay

## 2018-05-08 ENCOUNTER — Ambulatory Visit: Payer: BLUE CROSS/BLUE SHIELD | Admitting: Neurology

## 2018-05-08 ENCOUNTER — Encounter: Payer: Self-pay | Admitting: Neurology

## 2018-05-08 VITALS — BP 170/101 | HR 86 | Resp 16 | Ht 59.5 in | Wt 135.0 lb

## 2018-05-08 DIAGNOSIS — G500 Trigeminal neuralgia: Secondary | ICD-10-CM

## 2018-05-08 DIAGNOSIS — Z5181 Encounter for therapeutic drug level monitoring: Secondary | ICD-10-CM

## 2018-05-08 NOTE — Progress Notes (Signed)
Reason for visit: Headache  Referring physician: [facility]  [name] is a 57 y.o. female  History of present illness:  [name] is a 57 year old right-handed black female with a history of onset of headache within the last month or so involving primarily the right V1 distribution.  The patient was at work and suddenly noted onset of sharp jabs or shocks of pain in the V1 distribution that would come on frequently throughout the day.  The patient had some nausea originally, but no vomiting, she denied any photophobia but did have some phonophobia at times.  The patient has had no tearing of the eye or stuffiness of the nasal passageways with this.  The patient reports no other symptoms such as numbness or weakness of the face, arms, legs.  She denies any visual complaints or problems with speech or swallowing.  The patient would feel normal between sharp jabs of pain.  The sharp jabs again were very short-lived but very frequent.  Prednisone seemed to help the headache.  She had a temporal artery biopsy that was reportedly negative.  MRI of the brain was done and was unremarkable.  The patient was placed on carbamazepine for presumed trigeminal neuralgia, this seems to help the pain but has not eliminated the sharp jabs.  The patient is tolerating the carbamazepine the 200 mg twice daily dose currently.  She is sent to this office for an evaluation.  She has never had similar headaches in the past.  Originally, the sedimentation rate was 66, it returned to normal with a level of 30, the C-reactive protein was 5.  Past Medical History:  Diagnosis Date  . Anemia   . Arthritis    "neck, hips" (04/25/2018)  . Claustrophobia   . Daily headache   . GERD (gastroesophageal reflux disease)   . Hiatal hernia   . Hyperlipidemia    "hx" (04/25/2018)  . Hypertension   . Plantar fasciitis   . Stroke [facility]) 05/2014   "mini stroke" (04/25/2018)    Past Surgical History:  Procedure  Laterality Date  . ABDOMINAL HYSTERECTOMY  12/09   Secondary to fibroids  . ARTERY BIOPSY Right 04/10/2018   Procedure: BIOPSY TEMPORAL ARTERY;  Surgeon: Rosetta Posner, MD;  Location: [place];  Service: Vascular;  Laterality: Right;  . [place]; 1988  . FOOT SURGERY Left 03/2017   Bone Spurs Removed  . RADIOLOGY WITH ANESTHESIA N/A 04/26/2018   Procedure: MRI WITH ANESTHESIA;  Surgeon: Radiologist, Medication, MD;  Location: [place];  Service: Radiology;  Laterality: N/A;  . TUBAL LIGATION  1988    Family History  Problem Relation Age of Onset  . Cancer Mother 76       pancreatic cancer  . Pancreatic cancer Mother   . Heart disease Mother   . Cancer Father 79       throat cancer  . Cancer Maternal Aunt 60       breast cancer  . Cancer Other        Died  from Colon cancer in 83's  . Colon cancer Maternal Uncle   . Colon cancer Paternal Uncle   . Heart disease Sister     Social history:  reports that she has been smoking cigarettes. She has been smoking about 0.00 packs per day for the past 38.00 years. She has never used smokeless tobacco. She reports that she drinks alcohol. She reports that she has current or past drug history.  Medications:  Prior to Admission medications   Medication Sig Start Date End Date Taking? Authorizing Provider  acetaminophen (TYLENOL) 500 MG tablet Take 1,000 mg by mouth every 6 (six) hours as needed for moderate pain or headache.   Yes [provider]  amLODipine (NORVASC) 2.5 MG tablet Take 1 tablet (2.5 mg total) by mouth daily. 01/25/18  Yes Aldine Contes, MD  carbamazepine (CARBATROL) 200 MG 12 hr capsule Take 1 capsule (200 mg total) by mouth 2 (two) times daily. 04/28/18  Yes Santos-Sanchez, Merlene Morse, MD  ferrous sulfate (IRON SUPPLEMENT) 325 (65 FE) MG tablet Take 1 tablet (325 mg total) by mouth daily with breakfast. 03/01/16  Yes Bartholomew Crews, MD  hydrocortisone (ANUSOL-HC) 2.5 % rectal cream Place 1 application  rectally 2 (two) times daily. 09/26/17  Yes Levin Erp, PA  lisinopril (PRINIVIL,ZESTRIL) 20 MG tablet Take 1 tablet (20 mg total) by mouth daily. 01/25/18  Yes Aldine Contes, MD  omeprazole (PRILOSEC) 20 MG capsule TAKE 1 CAPSULE BY MOUTH ONCE DAILY Patient taking differently: Take 20 mg by mouth daily.  01/25/18  Yes Aldine Contes, MD  traMADol (ULTRAM) 50 MG tablet Take 1 tablet (50 mg total) by mouth every 6 (six) hours as needed for up to 12 doses. 04/19/18  Yes Velna Ochs, MD      Allergies  Allergen Reactions  . Aspirin Hives  . Hydromorphone Hcl Hives and Nausea And Vomiting    ROS:  Out of a complete 14 system review of symptoms, the patient complains only of the following symptoms, and all other reviewed systems are negative.  Headache  Blood pressure (!) 170/101, pulse 86, resp. rate 16, height 4' 11.5" (1.511 m), weight 135 lb (61.2 kg).  Physical Exam  General: The patient is alert and cooperative at the time of the examination.  Eyes: Pupils are equal, round, and reactive to light. Discs are flat bilaterally.  Neck: The neck is supple, no carotid bruits are noted.  Respiratory: The respiratory examination is clear.  Cardiovascular: The cardiovascular examination reveals a regular rate and rhythm, no obvious murmurs or rubs are noted.  Skin: Extremities are without significant edema.  Neurologic Exam  Mental status: The patient is alert and oriented x 3 at the time of the examination. The patient has apparent normal recent and remote memory, with an apparently normal attention span and concentration ability.  Cranial nerves: Facial symmetry is present. There is good sensation of the face to pinprick and soft touch bilaterally, with exception that there is some decrease in pinprick sensation on the forehead on the right, not the lower face. The strength of the facial muscles and the muscles to head turning and shoulder shrug are normal  bilaterally. Speech is well enunciated, no aphasia or dysarthria is noted. Extraocular movements are full. Visual fields are full. The tongue is midline, and the patient has symmetric elevation of the soft palate. No obvious hearing deficits are noted.  Motor: The motor testing reveals 5 over 5 strength of all 4 extremities. Good symmetric motor tone is noted throughout.  Sensory: Sensory testing is intact to pinprick and soft touch sensation on the arms and legs bilaterally, the position sense and vibration sensation appears to be slightly decreased on the right arm and leg as compared to the left.  No evidence of extinction is noted.  Coordination: Cerebellar testing reveals good finger-nose-finger and heel-to-shin bilaterally.  Gait and station: Gait is normal. Tandem gait is normal. Romberg is negative. No drift  is seen.  Reflexes: Deep tendon reflexes are symmetric and normal bilaterally. Toes are downgoing bilaterally.   MRI brain 04/26/18:  IMPRESSION: 1.  No acute intracranial abnormality. 2. Stable since 2015 and largely unremarkable for age MRI appearance of the brain. 3. Symmetric nasal cavity mucosal thickening raising the possibility of Rhinitis.  * MRI scan images were reviewed online. I agree with the written report.    Assessment/Plan:  1.  Right V1 neuralgia type pain  The patient describes a pain syndrome consistent with trigeminal neuralgia, but the pain is in the V1 distribution which is atypical for trigeminal neuralgia.  For this reason, other headache syndromes such as SUNA or SUNCT headache syndromes need to be considered.  The patient is getting some benefit from the carbamazepine, we will check levels and see if we can go up on the dose.  If this proves not to be fully effective, the patient may be given a trial on gabapentin or Topamax combined with Lamictal.  Prednisone did seem to help the headache previously.  She will follow-up in 4 months, she will call  for any dose adjustments.  We will check blood work today.  The duration of the jabs of pain are very short, this would go against the diagnosis of paroxysmal hemicrania.  Jill Alexanders MD 05/08/2018 7:28 AM  [facility] [address] [place], [place] [address]  Phone [phone] Fax [phone]

## 2018-05-09 ENCOUNTER — Ambulatory Visit (INDEPENDENT_AMBULATORY_CARE_PROVIDER_SITE_OTHER): Payer: BLUE CROSS/BLUE SHIELD | Admitting: Internal Medicine

## 2018-05-09 ENCOUNTER — Encounter: Payer: Self-pay | Admitting: Internal Medicine

## 2018-05-09 ENCOUNTER — Telehealth: Payer: Self-pay | Admitting: Neurology

## 2018-05-09 VITALS — BP 144/96 | HR 87 | Temp 98.5°F | Wt 135.0 lb

## 2018-05-09 DIAGNOSIS — D500 Iron deficiency anemia secondary to blood loss (chronic): Secondary | ICD-10-CM | POA: Diagnosis not present

## 2018-05-09 DIAGNOSIS — R51 Headache: Secondary | ICD-10-CM | POA: Diagnosis not present

## 2018-05-09 DIAGNOSIS — R740 Nonspecific elevation of levels of transaminase and lactic acid dehydrogenase [LDH]: Secondary | ICD-10-CM

## 2018-05-09 DIAGNOSIS — Z0000 Encounter for general adult medical examination without abnormal findings: Secondary | ICD-10-CM

## 2018-05-09 DIAGNOSIS — Z79899 Other long term (current) drug therapy: Secondary | ICD-10-CM

## 2018-05-09 DIAGNOSIS — Z90710 Acquired absence of both cervix and uterus: Secondary | ICD-10-CM

## 2018-05-09 DIAGNOSIS — R519 Headache, unspecified: Secondary | ICD-10-CM

## 2018-05-09 DIAGNOSIS — D649 Anemia, unspecified: Secondary | ICD-10-CM | POA: Diagnosis not present

## 2018-05-09 DIAGNOSIS — K219 Gastro-esophageal reflux disease without esophagitis: Secondary | ICD-10-CM

## 2018-05-09 DIAGNOSIS — K642 Third degree hemorrhoids: Secondary | ICD-10-CM

## 2018-05-09 DIAGNOSIS — K648 Other hemorrhoids: Secondary | ICD-10-CM

## 2018-05-09 DIAGNOSIS — R7401 Elevation of levels of liver transaminase levels: Secondary | ICD-10-CM

## 2018-05-09 LAB — COMPREHENSIVE METABOLIC PANEL
A/G RATIO: 1.6 (ref 1.2–2.2)
ALK PHOS: 86 IU/L (ref 39–117)
ALT: 39 IU/L — ABNORMAL HIGH (ref 0–32)
AST: 23 IU/L (ref 0–40)
Albumin: 4.3 g/dL (ref 3.5–5.5)
BUN/Creatinine Ratio: 18 (ref 9–23)
BUN: 12 mg/dL (ref 6–24)
Bilirubin Total: 0.3 mg/dL (ref 0.0–1.2)
CO2: 23 mmol/L (ref 20–29)
Calcium: 9.3 mg/dL (ref 8.7–10.2)
Chloride: 107 mmol/L — ABNORMAL HIGH (ref 96–106)
Creatinine, Ser: 0.66 mg/dL (ref 0.57–1.00)
GFR calc Af Amer: 114 mL/min/{1.73_m2} (ref >59)
GFR, EST NON AFRICAN AMERICAN: 99 mL/min/{1.73_m2} (ref >59)
Globulin, Total: 2.7 g/dL (ref 1.5–4.5)
Glucose: 100 mg/dL — ABNORMAL HIGH (ref 65–99)
POTASSIUM: 3.9 mmol/L (ref 3.5–5.2)
Sodium: 147 mmol/L — ABNORMAL HIGH (ref 134–144)
Total Protein: 7 g/dL (ref 6.0–8.5)

## 2018-05-09 LAB — CBC WITH DIFFERENTIAL/PLATELET
BASOS: 1 %
Basophils Absolute: 0 10*3/uL (ref 0.0–0.2)
EOS (ABSOLUTE): 0.1 10*3/uL (ref 0.0–0.4)
Eos: 3 %
Hematocrit: 35.2 % (ref 34.0–46.6)
Hemoglobin: 10.7 g/dL — ABNORMAL LOW (ref 11.1–15.9)
IMMATURE GRANULOCYTES: 0 %
Immature Grans (Abs): 0 10*3/uL (ref 0.0–0.1)
LYMPHS ABS: 1.5 10*3/uL (ref 0.7–3.1)
Lymphs: 33 %
MCH: 22.2 pg — ABNORMAL LOW (ref 26.6–33.0)
MCHC: 30.4 g/dL — AB (ref 31.5–35.7)
MCV: 73 fL — ABNORMAL LOW (ref 79–97)
MONOS ABS: 0.3 10*3/uL (ref 0.1–0.9)
Monocytes: 8 %
NEUTROS PCT: 55 %
Neutrophils Absolute: 2.5 10*3/uL (ref 1.4–7.0)
PLATELETS: 305 10*3/uL (ref 150–450)
RBC: 4.81 x10E6/uL (ref 3.77–5.28)
RDW: 15 % (ref 12.3–15.4)
WBC: 4.4 10*3/uL (ref 3.4–10.8)

## 2018-05-09 LAB — CARBAMAZEPINE LEVEL, TOTAL: Carbamazepine (Tegretol), S: <2 ug/mL — ABNORMAL LOW (ref 4.0–12.0)

## 2018-05-09 MED ORDER — AMBULATORY NON FORMULARY MEDICATION: 1 refills | Status: DC

## 2018-05-09 MED ORDER — CARBAMAZEPINE ER 200 MG PO CP12: 200.0000 mg | ORAL_CAPSULE | Freq: Two times a day (BID) | ORAL | 1 refills | Status: DC

## 2018-05-09 MED ORDER — OMEPRAZOLE 40 MG PO CPDR: 40.0000 mg | DELAYED_RELEASE_CAPSULE | Freq: Every day | ORAL | 3 refills | Status: DC

## 2018-05-09 MED ORDER — HYDROCORTISONE 2.5 % RE CREA: 1.0000 "application " | TOPICAL_CREAM | Freq: Two times a day (BID) | RECTAL | 1 refills | Status: DC

## 2018-05-09 MED FILL — PROCTOZONE-HC 2.5 % CREA: 2.5 | 15 days supply | Qty: 30 | Fill #0

## 2018-05-09 MED FILL — OMEPRAZOLE 20 MG CPDR: 20 | 90 days supply | Qty: 90 | Fill #1

## 2018-05-09 NOTE — Assessment & Plan Note (Signed)
This problem is chronic and improved.  I have reviewed neurology's notes and confirmed my understanding with [name] understanding from the visit yesterday.  Her headache is better but the pain is still there.  The shocking electric-like pain is much much better.  She feels that she is stable to return to work.  She feels that her job is stressful in that her coworkers gossip and argue among themselves.  Her supervisor is incredibly supportive.  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  I will provide a work note.  We discussed that trigeminal neuralgia is still on the differential diagnosis.  She has gotten some relief with the carbamazepine daily and will increase to the recommended twice daily dosing.  If she does not get good response to carbamazepine, neurology has left recommendations to try gabapentin or Topamax with Lamictal.  She has a neurology follow-up in about 4 months.  PLAN : Increase carbamazepine to BID Follow-up response Return to work without restrictions on 18 November

## 2018-05-09 NOTE — Telephone Encounter (Signed)
I called the patient.  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  Carbamazepine level is basically 0, the patient claims that she is taking 200 mg daily, she is to go up to 200 mg twice daily.

## 2018-05-09 NOTE — Assessment & Plan Note (Signed)
This problem is chronic but uncontrolled.  She previously had good response to omeprazole 20 mg daily but recently has been having GERD symptoms throughout the day.  She has no symptoms at night.  She is avoiding any spicy foods like hot sauce, ketchup, or black pepper.  She has no symptoms when she bends over.  She gets this at least once a week if not more frequently.  PLAN : Increase omeprazole to 40 once a day Would like to titrate to a lower dose if she is able to become asymptomatic GI referral

## 2018-05-09 NOTE — Progress Notes (Signed)
   Subjective:    Patient ID: [name], female    DOB: July 23, 1960, 57 y.o.   MRN: [id number]  HPI  [name] is here for HA F/U. Please see the A&P for the status of the pt's chronic medical problems.  ROS : per ROS section and in problem oriented charting. All other systems are negative.  PMHx, Soc hx, and / or Fam hx : off work 2/2 HA. Saw neuro yesterday. Summary of neuro note : Trigeminal neuralgia on diff but their hx and exam limited to just V1 dist'n which is not typical. Their hx also inc that she was asymptomatic between attacks rather than continuous pain like the hx I obtained. 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 They checked carbamazepine levels which were undetectable.  She states that she has only been taking the carbamazepine daily rather than twice daily as instructed so neurology has recommended that she increase the carbamazepine to twice daily.  They also checked a CBC and CMP and we reviewed those results.  Review of Systems  Constitutional: Negative for activity change, appetite change and unexpected weight change.  Gastrointestinal:       + GERD sxs Symptomatic prolapsing hemorrhoids but without pain or bleeding  Neurological: Positive for headaches.       Objective:   Physical Exam  Constitutional: She appears well-developed and well-nourished.  Non-toxic appearance. She does not appear ill. No distress.  Appears much better today with more interaction and less discomfort  HENT:  Head: Normocephalic and atraumatic.  Right Ear: External ear normal.  Left Ear: External ear normal.  Nose: Nose normal.  Eyes: Pupils are equal, round, and reactive to light. EOM are normal. Right eye exhibits no discharge. Left eye exhibits no discharge. No scleral icterus. Right eye exhibits normal extraocular motion. Left eye exhibits normal extraocular motion.  Skin: Skin is warm and dry. No rash noted.  Psychiatric: She has a normal mood and affect. Her behavior is  normal. Judgment and thought content normal.      Assessment & Plan:

## 2018-05-09 NOTE — Assessment & Plan Note (Signed)
This problem is chronic and has reoccurred.  I saw her back in March for prolapsing hemorrhoids and she was seen by GI and April of that year.  GIs recommendation was to use hydrocortisone ointment, nitroglycerin ointment, RectiCare cream with lidocaine, sitz baths, and increasing fiber supplement.  She got relief and was asymptomatic until recently when she has a recurrence of prolapsing hemorrhoids.  She denies any blood in the stool or any pain.  Her stools are loose and she has to strain to get them out.  She currently is only using Preparation H for symptomatic relief.  We discussed resumption of that she has recommendations and I sent the nitroglycerin lidocaine cream along with the steroid ointment to the outpatient pharmacy.  She would like to talk to GI about the recommendation to consider colonoscopy and would like to do this before the end of the year as she is already met her insurance deductible.  PLAN : Hydrocortisone treatment rectally Rectal nitroglycerin with lidocaine rectally Increase fiber Sitz bath Return to GI to discuss indications for colonoscopy

## 2018-05-09 NOTE — Assessment & Plan Note (Signed)
ELEVATED ALT This is a new problem.  She has had a very minimally elevated ALT this year x2.  She is asymptomatic.  She believes she had a blood transfusion a long time ago when she had a hysterectomy.  She has a monogamous relationship but indicates that it currently is not sexual.  She denies any other risk factors.  She is agreeable to checking hepatitis B & C as she has never been tested before.  PLAN : Hep C antibody Hep B surface antibody Hep B core antibody

## 2018-05-09 NOTE — Assessment & Plan Note (Signed)
This problem is chronic and stable.  Neurology checked a CBC yesterday and her hemoglobin remains low at 10.7 which is her baseline.  Her MCV is 73 with a normal RDW.  Her most recent ferritin was in 2015 and was normal at 121.  She takes iron supplements once daily.  PLAN : Ferritin level today

## 2018-05-09 NOTE — Patient Instructions (Signed)
I sent in your refills I am sending you back to GI Take the carbamazeepime twice a day

## 2018-05-10 ENCOUNTER — Encounter: Payer: Self-pay | Admitting: Internal Medicine

## 2018-05-10 LAB — HEPATITIS B SURFACE ANTIBODY,QUALITATIVE: HEP B SURFACE AB, QUAL: NONREACTIVE

## 2018-05-10 LAB — FERRITIN: Ferritin: 190 ng/mL — ABNORMAL HIGH (ref 15–150)

## 2018-05-10 LAB — HEPATITIS C ANTIBODY: Hep C Virus Ab: 0.2 s/co ratio (ref 0.0–0.9)

## 2018-05-10 LAB — HEPATITIS B CORE ANTIBODY, TOTAL: HEP B C TOTAL AB: NEGATIVE

## 2018-05-14 ENCOUNTER — Telehealth: Payer: Self-pay | Admitting: *Deleted

## 2018-05-14 NOTE — Telephone Encounter (Signed)
-----   Message from Bartholomew Crews, MD sent at 05/09/2018 10:44 AM EST ----- Will you please phone in the nitroglycerin lidocaine cream for rectal use to the [facility] outpatient pharmacy?  Thanks

## 2018-05-14 NOTE — Telephone Encounter (Signed)
Thank you :)

## 2018-05-14 NOTE — Telephone Encounter (Signed)
Attempted to call this Rx to [facility], however, it needs to be compounded and they do not do that. Alvie Heidelberg recommended [facility] on [address], however, they do not take insurance. Spoke with Gwinda Passe at [facility]. States they would have to use 5% lidocaine and cost would be around $60 for 60g. They will give patient claim form to complete and send to insurance company. Left message on patient's VM requesting return call to discuss. Hubbard Hartshorn, RN, BSN

## 2018-05-27 MED FILL — OMEPRAZOLE 40 MG CPDR: 40 | 90 days supply | Qty: 90 | Fill #0

## 2018-06-24 ENCOUNTER — Other Ambulatory Visit: Payer: Self-pay | Admitting: Internal Medicine

## 2018-06-24 NOTE — Telephone Encounter (Signed)
Pls sch PCP appt Feb / March 3 month F/U

## 2018-06-27 NOTE — Telephone Encounter (Signed)
Next appt scheduled  08/08/18 with PCP.

## 2018-06-28 ENCOUNTER — Telehealth: Payer: Self-pay

## 2018-06-28 NOTE — Telephone Encounter (Signed)
Requesting to speak with Dr Lynnae January. Please call pt back.

## 2018-07-03 ENCOUNTER — Other Ambulatory Visit: Payer: Self-pay | Admitting: Internal Medicine

## 2018-07-03 DIAGNOSIS — R51 Headache: Principal | ICD-10-CM

## 2018-07-03 DIAGNOSIS — R519 Headache, unspecified: Secondary | ICD-10-CM

## 2018-07-03 NOTE — Telephone Encounter (Signed)
She had this for HA which should be resolving. Is she still having HA?

## 2018-07-10 NOTE — Telephone Encounter (Signed)
Attempted to call pt, with no results, sent note to pharm to have pt call md office

## 2018-07-26 ENCOUNTER — Telehealth: Payer: Self-pay | Admitting: Internal Medicine

## 2018-07-26 ENCOUNTER — Other Ambulatory Visit: Payer: Self-pay | Admitting: Internal Medicine

## 2018-07-26 DIAGNOSIS — R51 Headache: Principal | ICD-10-CM

## 2018-07-26 DIAGNOSIS — R519 Headache, unspecified: Secondary | ICD-10-CM

## 2018-07-26 MED FILL — LISINOPRIL 20 MG TABLET: 20 | 90 days supply | Qty: 90 | Fill #0

## 2018-07-26 MED FILL — AMLODIPINE 2.5 MG TABLET: 2.5 | 90 days supply | Qty: 90 | Fill #0

## 2018-07-26 NOTE — Telephone Encounter (Signed)
Called pt no answer °

## 2018-07-26 NOTE — Telephone Encounter (Signed)
Pt missed a call, no message, pls return call [phone]

## 2018-07-26 NOTE — Telephone Encounter (Signed)
Called pt about Tramadol refill request; no answer; left message to call the office.

## 2018-08-07 MED FILL — LISINOPRIL 20 MG TABLET: 20 | 90 days supply | Qty: 90 | Fill #0

## 2018-08-07 MED FILL — AMLODIPINE 2.5 MG TABLET: 2.5 | 90 days supply | Qty: 90 | Fill #0

## 2018-08-07 NOTE — Telephone Encounter (Signed)
I had given her this in 2018 for foot pain. Then got end of 2019 for HA / pain after temp artery bx. Should not be on chronically.

## 2018-08-08 ENCOUNTER — Ambulatory Visit: Payer: BLUE CROSS/BLUE SHIELD | Admitting: Internal Medicine

## 2018-08-29 ENCOUNTER — Ambulatory Visit: Payer: BLUE CROSS/BLUE SHIELD | Admitting: Internal Medicine

## 2018-09-09 ENCOUNTER — Encounter (INDEPENDENT_AMBULATORY_CARE_PROVIDER_SITE_OTHER): Payer: Self-pay

## 2018-09-09 ENCOUNTER — Ambulatory Visit (INDEPENDENT_AMBULATORY_CARE_PROVIDER_SITE_OTHER): Payer: BLUE CROSS/BLUE SHIELD | Admitting: Internal Medicine

## 2018-09-09 ENCOUNTER — Other Ambulatory Visit: Payer: Self-pay

## 2018-09-09 ENCOUNTER — Telehealth: Payer: Self-pay | Admitting: Internal Medicine

## 2018-09-09 ENCOUNTER — Other Ambulatory Visit: Payer: Self-pay | Admitting: *Deleted

## 2018-09-09 VITALS — BP 118/70 | HR 78 | Ht 59.5 in | Wt 139.7 lb

## 2018-09-09 DIAGNOSIS — Z720 Tobacco use: Secondary | ICD-10-CM

## 2018-09-09 DIAGNOSIS — R110 Nausea: Secondary | ICD-10-CM | POA: Diagnosis not present

## 2018-09-09 DIAGNOSIS — R05 Cough: Secondary | ICD-10-CM

## 2018-09-09 DIAGNOSIS — J029 Acute pharyngitis, unspecified: Secondary | ICD-10-CM | POA: Diagnosis not present

## 2018-09-09 DIAGNOSIS — R059 Cough, unspecified: Secondary | ICD-10-CM

## 2018-09-09 NOTE — Telephone Encounter (Signed)
   Reason for call:   I received a call from [name] at 7:40 AM indicating that she had 2 episodes of diarrhea yesterday along with mild nausea and developed some cough this morning, she denies any fever or chills.   Pertinent Data:   Patient has a sick contact as her fiencee was sick with diarrhea and vomiting over the weekend, he was having some chills but no fever.    She had 2 episodes of diarrhea along with mild nausea yesterday, no vomiting, no fever or chills.  She developed some dry cough this morning, no sore throat or nasal congestion yet. No shortness of breath.  She wants to be seen in [facility], so she can get a work note, has had her appointment with Dr. Lynnae January on Thursday.   Assessment / Plan / Recommendations:   Most likely a viral illness, advised patient to keep herself well-hydrated and call clinic after 8:30 AM today to be seen in [facility].  As always, pt is advised that if symptoms worsen or new symptoms arise, they should go to an urgent care facility or to to ER for further evaluation.   Ruth Nimrod, MD   09/09/2018, 7:43 AM

## 2018-09-09 NOTE — Progress Notes (Signed)
   CC: dry cough  HPI:  [name] is a 58 y.o. with PMH as below.   Please see A&P for assessment of the patient's acute and chronic medical conditions.   Past Medical History:  Diagnosis Date  . Anemia   . Arthritis    "neck, hips" (04/25/2018)  . Claustrophobia   . Daily headache   . GERD (gastroesophageal reflux disease)   . Hiatal hernia   . Hyperlipidemia    "hx" (04/25/2018)  . Hypertension   . Plantar fasciitis   . Stroke [facility]) 05/2014   "mini stroke" (04/25/2018)   Review of Systems:   Review of Systems  Constitutional: Negative for chills, diaphoresis, fever and malaise/fatigue.  HENT: Positive for sore throat. Negative for congestion, ear pain and sinus pain.   Eyes: Negative for discharge and redness.  Respiratory: Positive for cough. Negative for hemoptysis, sputum production, shortness of breath and wheezing.   Cardiovascular: Negative for chest pain and palpitations.  Gastrointestinal: Positive for nausea. Negative for constipation, diarrhea and vomiting.  Neurological: Negative for dizziness, tingling and headaches.   Physical Exam:  Constitution: NAD, well-nourished HENT: [place]/AT, no tonsillar or pharyngeal erythema, no sinus tenderness Eyes: no icterus or injection Cardio: RRR, no m/r/g  Respiratory: CTA, no w/r/r  Abdominal: NTTP MSK: no edema, moving all extremities    Vitals:   09/09/18 1107  BP: 118/70  Pulse: 78  SpO2: 100%  Weight: 139 lb 11.2 oz (63.4 kg)  Height: 4' 11.5" (1.511 m)     Assessment & Plan:   See Encounters Tab for problem based charting.  Patient discussed with Dr. Angelia Mould

## 2018-09-09 NOTE — Assessment & Plan Note (Signed)
States she has dry cough which started yesterday. One episode of loose stool yesterday and some nausea this am. She denies fever, chills, SOB, malaise, myalgia, and cough is non-productive. She denies sinus pain, ear pain, and endorses mild sore throat. She states she is prone to getting viral URI but has no significant history. Smokes 1.5 cigarettes per day and is trying to quit. Her boyfriend has gotten sick in the last two days as well but does not have fever or SOB. Neither have recent travel although he works for a Bi[employer]ist, clinical where truckers come through, and she does work in a nursing home. She does not need a flu test at this time, but will provide information for strict return precautions.   - As she only has mild cough without associated symptoms at this time, recommend she stay home from work for at least 48 hours and until symptoms resolve  - OTC cough drops and dextromethorphan if needed for cough  - call clinic if she develops symptoms of fever, chills, SOB or if her symptoms worsen.

## 2018-09-09 NOTE — Patient Instructions (Signed)
Thank you for allowing [place] to provide your care today. Today we discussed dry cough.      Today we made the following changes to your medications:   Please START taking   Cough drops as needed for your dry cough.   If your cough is not relieved with cough drops you can try Dextromethorphan (robitussen) over the counter.  Please do not return to work for at least 48 hours and until your cough and symptoms resolve.   Should you have any questions or concerns please call the internal medicine clinic at [phone].    Coronavirus (COVID-19) Are you at risk?  Are you at risk for the Coronavirus (COVID-19)?  To be considered HIGH RISK for Coronavirus (COVID-19), you have to meet the following criteria:  . Traveled to [place], [place], [place], [place] or [place]; or in the [place] to [place], [place], [place], or [place]; and have fever, cough, and shortness of breath within the last 2 weeks of travel OR . Been in close contact with a person diagnosed with COVID-19 within the last 2 weeks and have fever, cough, and shortness of breath . IF YOU DO NOT MEET THESE CRITERIA, YOU ARE CONSIDERED LOW RISK FOR COVID-19.  What to do if you are HIGH RISK for COVID-19?  Marland Kitchen If you are having a medical emergency, call 911. . Seek medical care right away. Before you go to a doctor's office, urgent care or emergency department, call ahead and tell them about your recent travel, contact with someone diagnosed with COVID-19, and your symptoms. You should receive instructions from your physician's office regarding next steps of care.  . When you arrive at healthcare provider, tell the healthcare staff immediately you have returned from visiting [place], [place], [place], [place] or [place]; or traveled in the [place] to [place], [place], [place], or [place]; in the last two weeks or you have been in close contact with a person diagnosed with COVID-19 in the last 2 weeks.   . Tell the  health care staff about your symptoms: fever, cough and shortness of breath. . After you have been seen by a medical provider, you will be either: o Tested for (COVID-19) and discharged home on quarantine except to seek medical care if symptoms worsen, and asked to  - Stay home and avoid contact with others until you get your results (4-5 days)  - Avoid travel on public transportation if possible (such as bus, train, or airplane) or o Sent to the Emergency Department by EMS for evaluation, COVID-19 testing, and possible admission depending on your condition and test results.  What to do if you are LOW RISK for COVID-19?  Reduce your risk of any infection by using the same precautions used for avoiding the common cold or flu:  Marland Kitchen Wash your hands often with soap and warm water for at least 20 seconds.  If soap and water are not readily available, use an alcohol-based hand sanitizer with at least 60% alcohol.  . If coughing or sneezing, cover your mouth and nose by coughing or sneezing into the elbow areas of your shirt or coat, into a tissue or into your sleeve (not your hands). . Avoid shaking hands with others and consider head nods or verbal greetings only. . Avoid touching your eyes, nose, or mouth with unwashed hands.  . Avoid close contact with people who are sick. . Avoid places or events with large numbers of people in one  location, like concerts or sporting events. . Carefully consider travel plans you have or are making. . If you are planning any travel outside or inside the [place], visit the CDC's Travelers' Health webpage for the latest health notices. . If you have some symptoms but not all symptoms, continue to monitor at home and seek medical attention if your symptoms worsen. . If you are having a medical emergency, call 911.   [place] / e-Visit: [email or web address]         [facility]: [facility]: [phone]                   [facility]: [phone]

## 2018-09-12 ENCOUNTER — Ambulatory Visit: Payer: BLUE CROSS/BLUE SHIELD | Admitting: Internal Medicine

## 2018-09-16 ENCOUNTER — Ambulatory Visit: Payer: BLUE CROSS/BLUE SHIELD | Admitting: Neurology

## 2018-09-16 ENCOUNTER — Telehealth: Payer: Self-pay

## 2018-09-16 NOTE — Telephone Encounter (Signed)
Error

## 2018-09-16 NOTE — Telephone Encounter (Signed)
On 3/20 I reached out to the pt and left a vm on [phone] that due to concerns related to covid 19 our office would not be seeing pt's on 09/16/18. I advised once I had a reschedule date I would call back.

## 2018-09-17 ENCOUNTER — Ambulatory Visit (HOSPITAL_COMMUNITY)
Admission: EM | Admit: 2018-09-17 | Discharge: 2018-09-17 | Disposition: A | Discharge status: Home / Self Care | Payer: BLUE CROSS/BLUE SHIELD | Attending: Family Medicine | Admitting: Family Medicine

## 2018-09-17 ENCOUNTER — Other Ambulatory Visit: Payer: Self-pay

## 2018-09-17 ENCOUNTER — Encounter (HOSPITAL_COMMUNITY): Payer: Self-pay

## 2018-09-17 DIAGNOSIS — M5442 Lumbago with sciatica, left side: Secondary | ICD-10-CM

## 2018-09-17 MED ORDER — CYCLOBENZAPRINE HCL 10 MG PO TABS: 10.0000 mg | ORAL_TABLET | Freq: Every day | ORAL | 0 refills | Status: DC

## 2018-09-17 MED ORDER — PREDNISONE 20 MG PO TABS: 20.0000 mg | ORAL_TABLET | Freq: Two times a day (BID) | ORAL | 0 refills | Status: AC

## 2018-09-17 NOTE — ED Provider Notes (Signed)
[facility]   [id number] 09/17/18 Arrival Time: 0807  CC: Back pain  SUBJECTIVE: History from: patient. [name] is a 58 y.o. female hx significant for GERD, HLD, HTN, stroke, complains of left low back pain x 3 days.  Works as a Chartered certified accountant, and reports she may have "overdid" it at work.  Localizes the pain to the left low back.  Describes the pain as constant and throbbing in character.  Has tried OTC medications like tylenol with minimal relief.  Also wearing back brace and patch.  Symptoms are made worse with walking.  Reports similar symptoms in the past.  Complains of numbness and tingling in left leg.  Denies fever, chills, erythema, ecchymosis, effusion, weakness, saddle paresthesias, loss of bowel or bladder function      ROS: As per HPI.  Past Medical History:  Diagnosis Date  . Anemia   . Arthritis    "neck, hips" (04/25/2018)  . Claustrophobia   . Daily headache   . GERD (gastroesophageal reflux disease)   . Hiatal hernia   . Hyperlipidemia    "hx" (04/25/2018)  . Hypertension   . Plantar fasciitis   . Stroke [facility]) 05/2014   "mini stroke" (04/25/2018)   Past Surgical History:  Procedure Laterality Date  . ABDOMINAL HYSTERECTOMY  12/09   Secondary to fibroids  . ARTERY BIOPSY Right 04/10/2018   Procedure: BIOPSY TEMPORAL ARTERY;  Surgeon: Rosetta Posner, MD;  Location: [facility];  Service: Vascular;  Laterality: Right;  . [facility]; 1988  . FOOT SURGERY Left 03/2017   Bone Spurs Removed  . RADIOLOGY WITH ANESTHESIA N/A 04/26/2018   Procedure: MRI WITH ANESTHESIA;  Surgeon: Radiologist, Medication, MD;  Location: [facility];  Service: Radiology;  Laterality: N/A;  . TUBAL LIGATION  1988   Allergies  Allergen Reactions  . Aspirin Hives  . Hydromorphone Hcl Hives and Nausea And Vomiting   No current facility-administered medications on file prior to encounter.    Current Outpatient Medications on File Prior to Encounter  Medication Sig  Dispense Refill  . acetaminophen (TYLENOL) 500 MG tablet Take 1,000 mg by mouth every 6 (six) hours as needed for moderate pain or headache.    . AMBULATORY NON FORMULARY MEDICATION Medication Name: nitroglycerin 0.125 mg ointment mixed with 2% lidocaine three times a day for 6-8 weeks 1 Tube 1  . amLODipine (NORVASC) 2.5 MG tablet TAKE 1 TABLET BY MOUTH DAILY. 90 tablet 3  . carbamazepine (CARBATROL) 200 MG 12 hr capsule Take 1 capsule (200 mg total) by mouth 2 (two) times daily. 180 capsule 1  . ferrous sulfate (IRON SUPPLEMENT) 325 (65 FE) MG tablet Take 1 tablet (325 mg total) by mouth daily with breakfast. 30 tablet 3  . hydrocortisone (ANUSOL-HC) 2.5 % rectal cream Place 1 application rectally 2 (two) times daily. 30 g 1  . lisinopril (PRINIVIL,ZESTRIL) 20 MG tablet TAKE 1 TABLET BY MOUTH DAILY. 90 tablet 3  . omeprazole (PRILOSEC) 40 MG capsule Take 1 capsule (40 mg total) by mouth daily. 90 capsule 3   Social History   Socioeconomic History  . Marital status: Divorced    Spouse name: Not on file  . Number of children: Not on file  . Years of education: Not on file  . Highest education level: Not on file  Occupational History  . 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 Financial resource strain: Not on file  .  Food insecurity:    Worry: Not on file    Inability: Not on file  . Transportation needs:    Medical: Not on file    Non-medical: Not on file  Tobacco Use  . Smoking status: Current Some Day Smoker    Packs/day: 0.00    Years: 38.00    Pack years: 0.00    Types: Cigarettes  . Smokeless tobacco: Never Used  . Tobacco comment: 1.5  cigarettes daily  Substance and Sexual Activity  . Alcohol use: Yes    Comment: 2 beers on weekend  . Drug use: Not Currently  . Sexual activity: Not Currently  Lifestyle  . Physical activity:    Days per week: Not on file    Minutes per session: Not on file  . Stress: Not on file   Relationships  . Social connections:    Talks on phone: Not on file    Gets together: Not on file    Attends religious service: Not on file    Active member of club or organization: Not on file    Attends meetings of clubs or organizations: Not on file    Relationship status: Not on file  . Intimate partner violence:    Fear of current or ex partner: Not on file    Emotionally abused: Not on file    Physically abused: Not on file    Forced sexual activity: Not on file  Other Topics Concern  . Not on file  Social History Narrative   10 th grade education.   Family History  Problem Relation Age of Onset  . Cancer Mother 70       pancreatic cancer  . Pancreatic cancer Mother   . Heart disease Mother   . Cancer Father 52       throat cancer  . Cancer Maternal Aunt 60       breast cancer  . Cancer Other        Died  from Colon cancer in 32's  . Colon cancer Maternal Uncle   . Colon cancer Paternal Uncle   . Heart disease Sister     OBJECTIVE:  Vitals:   09/17/18 0818  BP: (!) 151/87  Resp: 16  Temp: 98.4 F (36.9 C)  TempSrc: Oral  SpO2: 100%    General appearance: AOx3; in no acute distress.  Head: NCAT Lungs: CTA bilaterally Heart: RRR.  Musculoskeletal: Low back Inspection: Back brace present, no obvious rash Palpation: TTP over LT low back ROM: FROM active and passive Strength: 5/5 shld abduction, 5/5 shld adduction, 5/5 elbow flexion, 5/5 elbow extension, 5/5 grip strength, 5/5 hip flexion, 5/5 knee abduction, 5/5 knee adduction, 5/5 knee flexion, 5/5 knee extension, 5/5 dorsiflexion, 5/5 plantar flexion Skin: warm and dry Neurologic: Ambulates without difficulty; Sensation intact about the upper/ lower extremities Psychological: alert and cooperative; normal mood and affect  ASSESSMENT & PLAN:  1. Acute left-sided low back pain with left-sided sciatica     Meds ordered this encounter  Medications  . predniSONE (DELTASONE) 20 MG tablet    Sig: Take  1 tablet (20 mg total) by mouth 2 (two) times daily with a meal for 5 days.    Dispense:  10 tablet    Refill:  0    Order Specific Question:   Supervising Provider    Answer:   Raylene Everts [[id number]]  . cyclobenzaprine (FLEXERIL) 10 MG tablet    Sig: Take 1 tablet (10 mg total) by mouth  at bedtime.    Dispense:  15 tablet    Refill:  0    Order Specific Question:   Supervising Provider    Answer:   Raylene Everts [[id number]]    Continue conservative management of rest, ice, heat, and gentle stretches Prednisone prescribed.  Take as directed and to completion Take cyclobenzaprine at nighttime for symptomatic relief. Avoid driving or operating heavy machinery while using medication. Follow up with PCP or with Molli Barrows, FNP if symptoms persist and/or to establish care.   Return or go to the ER if you have any new or worsening symptoms (fever, chills, chest pain, abdominal pain, changes in bowel or bladder habits, numbness or tingling in pelvis, loss of bowel or bladder function, etc...)   Reviewed expectations re: course of current medical issues. Questions answered. Outlined signs and symptoms indicating need for more acute intervention. Patient verbalized understanding. After Visit Summary given.    Stacey Drain [place], PA-C 09/17/18 1000

## 2018-09-17 NOTE — ED Notes (Signed)
Patient verbalizes understanding of discharge instructions. Opportunity for questioning and answers were provided. Patient discharged from [facility] by APP.

## 2018-09-17 NOTE — Discharge Instructions (Signed)
Continue conservative management of rest, ice, heat, and gentle stretches Prednisone prescribed.  Take as directed and to completion Take cyclobenzaprine at nighttime for symptomatic relief. Avoid driving or operating heavy machinery while using medication. Follow up with PCP or with Molli Barrows, FNP if symptoms persist and/or to establish care.   Return or go to the ER if you have any new or worsening symptoms (fever, chills, chest pain, abdominal pain, changes in bowel or bladder habits, numbness or tingling in pelvis, loss of bowel or bladder function, etc...)

## 2018-09-17 NOTE — ED Triage Notes (Signed)
Patient presents to Urgent Care with complaints of lower back pain since 3 days ago while she was at work. Patient states she has disc problems in L6 & 7. Pt has been taking Tylenol with minimal relief.

## 2018-09-18 ENCOUNTER — Telehealth: Payer: Self-pay | Admitting: Internal Medicine

## 2018-09-18 ENCOUNTER — Telehealth: Payer: BLUE CROSS/BLUE SHIELD | Admitting: Internal Medicine

## 2018-09-18 NOTE — Telephone Encounter (Signed)
Asked pt to call and sch a telephone CC appt

## 2018-09-19 ENCOUNTER — Ambulatory Visit: Payer: BLUE CROSS/BLUE SHIELD | Admitting: Internal Medicine

## 2018-09-19 ENCOUNTER — Other Ambulatory Visit: Payer: Self-pay

## 2018-09-19 MED FILL — OMEPRAZOLE 40 MG CPDR: 40 | 30 days supply | Qty: 30 | Fill #0

## 2018-09-19 NOTE — Progress Notes (Signed)
Internal Medicine Clinic Attending  Case discussed with Dr. Seawell at the time of the visit.  We reviewed the resident's history and exam and pertinent patient test results.  I agree with the assessment, diagnosis, and plan of care documented in the resident's note.    

## 2018-09-20 ENCOUNTER — Ambulatory Visit (INDEPENDENT_AMBULATORY_CARE_PROVIDER_SITE_OTHER): Payer: BLUE CROSS/BLUE SHIELD | Admitting: Internal Medicine

## 2018-09-20 ENCOUNTER — Encounter: Payer: Self-pay | Admitting: Internal Medicine

## 2018-09-20 DIAGNOSIS — K219 Gastro-esophageal reflux disease without esophagitis: Secondary | ICD-10-CM | POA: Diagnosis not present

## 2018-09-20 DIAGNOSIS — Z0000 Encounter for general adult medical examination without abnormal findings: Secondary | ICD-10-CM

## 2018-09-20 DIAGNOSIS — R519 Headache, unspecified: Secondary | ICD-10-CM

## 2018-09-20 DIAGNOSIS — I10 Essential (primary) hypertension: Secondary | ICD-10-CM | POA: Diagnosis not present

## 2018-09-20 DIAGNOSIS — R51 Headache: Secondary | ICD-10-CM

## 2018-09-20 DIAGNOSIS — M5410 Radiculopathy, site unspecified: Secondary | ICD-10-CM

## 2018-09-20 DIAGNOSIS — D500 Iron deficiency anemia secondary to blood loss (chronic): Secondary | ICD-10-CM

## 2018-09-20 MED ORDER — HYDROCODONE-ACETAMINOPHEN 5-325 MG PO TABS: 1.0000 | ORAL_TABLET | Freq: Four times a day (QID) | ORAL | 0 refills | Status: DC | PRN

## 2018-09-20 MED ORDER — PANTOPRAZOLE SODIUM 40 MG PO TBEC: 40.0000 mg | DELAYED_RELEASE_TABLET | Freq: Every day | ORAL | 3 refills | Status: DC

## 2018-09-20 NOTE — Assessment & Plan Note (Signed)
This problem is chronic and stable.  She is on lisinopril 20 mg and amlodipine 2.5 mg and has no side effects to these medications.  She is able to get her blood pressure checked at work and on Monday the 23rd, it was 125/80.  PLAN:  Cont current meds

## 2018-09-20 NOTE — Assessment & Plan Note (Signed)
This problem is new and uncontrolled.  She went to the urgent care on 24 March for left flank pain, left back pain, with radiation to her leg and involving her whole foot and toes.  She was prescribed prednisone and Flexeril but has not had any relief of the symptoms.  Her leg kind of feels numb.  She is not having any incontinence of bowel or bladder.  She is having difficulty walking due to the pain.  She has tried Tylenol and ibuprofen without relief.  We reviewed her chart and she had similar symptoms in January 2018 and tried numerous medications including Mobic, ibuprofen, Flexeril, gabapentin, and hydrocodone.  I indicated that she got minimal relief with hydrocodone at bedtime but she is wanting to try it again.  Her symptoms at that time resolved without any intervention other than medication and time and we discussed that that is likely the path her current symptoms are going to take.  She requested and I provided a work note for March 25 to the 27th.  I am giving her 15 hydrocodone 5 mg and instructed her to combine it with ibuprofen or Aleve during the day and night but to only take additional acetaminophen during the day.  We discussed that if she does not get relief, she would need an MRI and consideration of a steroid injection.  Did not abandon it, we are not doing any nonemergent and we discussed that.  Urgent care explained about red flag symptoms including bowel and bladder incontinence.  PLAN: Hydrocodone 5 mg #15

## 2018-09-20 NOTE — Assessment & Plan Note (Signed)
This problem is chronic and improved.  The #1 differential for her headaches with trigeminal neuralgia although it was slightly atypical in the anatomic distribution.  She has stopped her carbamazepine and her neurology follow-up was canceled earlier this week due to the pain did not.  She states her headaches are a lot better.  They are still slightly they are but much much better even off of the medication.  We discussed that she did not need to reschedule her neurology follow-up since she is doing well off carbamazepine.  Plan: follow

## 2018-09-20 NOTE — Progress Notes (Signed)
   Subjective:    Patient ID: [name], female    DOB: [birth year], 58 y.o.   MRN: [id number]  HPI  This is a telephone encounter between DORAINE SCHEXNIDER and Larey Dresser on 09/20/2018 for left back and left leg pain. The visit was conducted with the patient located at home and Larey Dresser at [facility]. The patient's identity was confirmed using their DOB and current address. The patient has consented to being evaluated through a telephone encounter and understands the associated risks (an examination cannot be done and the patient may need to come in for an appointment) / benefits (allows the patient to remain at home, decreasing exposure to coronavirus). I personally spent 17 minutes on medical discussion.  Telephone call ended at 11:17 AM.  Please see the A&P for the status of the pt's chronic medical problems.  ROS : per ROS section and in problem oriented charting. All other systems are negative.  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  She also is able to get her blood pressure checked at work.  She has been out of work since Monday when she saw the urgent care on March 24 for left flank, back, and leg pain.  She was given prednisone and Flexeril but has not gotten any relief from her symptoms.  Review of Systems  Gastrointestinal:       GERD symptoms during the day but not at night  Musculoskeletal: Positive for back pain and gait problem.  Neurological:       Headaches are much improved  Psychiatric/Behavioral: Positive for sleep disturbance.      Objective:   Physical Exam This was a telephone only visit       Assessment & Plan:

## 2018-09-20 NOTE — Assessment & Plan Note (Signed)
This problem is chronic and stable.  I recently talked her ferritin in November 2019 and it was 190.  I instructed her to stop her iron supplementation which she did.  I will need to recheck blood work sometime in 2021.  PLAN : Follow

## 2018-09-20 NOTE — Assessment & Plan Note (Signed)
We did not discuss hepatitis B vaccine but I can do that at her next appointment I need to follow-up about our discussions about colonoscopy [employer] [facility] policy only covers [facility] and so I told her I would continue to see her either by telephone or in person until she can change her insurance through the marketplace at the end of the year

## 2018-09-20 NOTE — Assessment & Plan Note (Addendum)
This problem is chronic and uncontrolled.  At her last appointment, I had increased her omeprazole dose to 40 mg but this higher dose is not helping.  She gets a burning sensation in her chest from her stomach and this occurs only during the day.  She does not get nocturnal symptoms.  She has done significant diet modification including getting rid of tomato products and spicy foods and other changes and it has not helped.  She is concerned because her mother had similar symptoms when she had pancreatic cancer.  [name] has not had any abdominal imaging anytime recently.  I am not concerned that this is pancreatic cancer but rather uncontrolled GERD.  PPIs are supposed to be equivocal but some patients have better improvement with the gone over another.  Therefore, I sent in a prescription for pantoprazole 40 mg with instructions to the pharmacy to switch to any PPI covered by her insurance.  We discussed that she will need a GI appointment for consideration of an EGD if she does not have resolution of her symptoms.  PLAN : Stop omeprazole Start pantoprazole 40 daily or other PPI covered by her insurance

## 2018-10-07 ENCOUNTER — Telehealth: Payer: Self-pay | Admitting: Internal Medicine

## 2018-10-07 NOTE — Telephone Encounter (Signed)
Returned call to patient. States back pain is now 8/10. Took 2 tylenol recently which brought pain down from 10/10. No relief with hydrocodone and Aleve. Has not tried hydrocodone with ibuprofen but will do that this evening. Now with 3 day hx of "bad cramps" in left hand and legs bilaterally. Wants to know what she should do. Ok to call her back tomorrow. Hubbard Hartshorn, RN, BSN

## 2018-10-07 NOTE — Telephone Encounter (Signed)
pls le me know what she wants

## 2018-10-07 NOTE — Telephone Encounter (Signed)
Pt is wanting to speak Dr Lynnae January, doing f/u on prescriptions; please contact [phone]

## 2018-10-08 NOTE — Telephone Encounter (Signed)
Yes pls. [facility] teleheath to start but may need in person

## 2018-10-09 ENCOUNTER — Telehealth: Payer: Self-pay | Admitting: Internal Medicine

## 2018-10-09 ENCOUNTER — Other Ambulatory Visit: Payer: Self-pay

## 2018-10-09 ENCOUNTER — Ambulatory Visit: Payer: BLUE CROSS/BLUE SHIELD | Admitting: Internal Medicine

## 2018-10-09 NOTE — Telephone Encounter (Signed)
attempted to call back this afternoon left another message

## 2018-10-09 NOTE — Telephone Encounter (Signed)
Tele health appt done this am.

## 2018-10-09 NOTE — Progress Notes (Deleted)
   [facility] Telephone Encounter  Reason for call:   This telephone encounter was created for [name] on 10/09/2018 for the following purpose/cc ***.   Pertinent Data:   ***  ROS ***   Assessment / Plan / Recommendations:   ***  As always, pt is advised that if symptoms worsen or new symptoms arise, they should go to an urgent care facility or to to ER for further evaluation.   Consent and Medical Decision Making:   Patient {GC/GE:3044014::"discussed with","seen with"} Dr. {NAMES:3044014::"Butcher","Granfortuna","E. Hoffman","Klima","Mullen","Narendra","Raines","Vincent"}  This is a telephone encounter between [name] and [name] on 10/09/2018 for ***. The visit was conducted with the patient located at {NAMES:3044014::"home"} and [name] at {NAMES:3044014::"[facility]","Home"}. The patient's identity was confirmed using their DOB and current address. The {WHO:3044014::"patient","his/her legal guardian","***"} has consented to being evaluated through a telephone encounter and understands the associated risks (an examination cannot be done and the patient may need to come in for an appointment) / benefits (allows the patient to remain at home, decreasing exposure to coronavirus). I personally spent {Numbers; [phone]} minutes on medical discussion.

## 2018-10-09 NOTE — Telephone Encounter (Signed)
Called x3 this am and left a message

## 2018-10-14 NOTE — Progress Notes (Signed)
Encounter entered in error.

## 2018-10-29 NOTE — Progress Notes (Signed)
  [facility] Telephone Encounter Continuity Care Appointment  HPI:   This telephone encounter was created for [name] on 10/29/2018 for the following purpose/cc hemorrhoids, and HTN.   Past Medical History:  Past Medical History:  Diagnosis Date  . Anemia   . Arthritis    "neck, hips" (04/25/2018)  . Claustrophobia   . Daily headache   . GERD (gastroesophageal reflux disease)   . Hiatal hernia   . Hyperlipidemia    "hx" (04/25/2018)  . Hypertension   . Plantar fasciitis   . Stroke [facility]) 05/2014   "mini stroke" (04/25/2018)      ROS:   Reports increased defecation, burning pain in rectal area, and prolapsing hemorrhoids. Denies any melena, BRBPR, fevers, chills, nausea or vomiting   Assessment / Plan / Recommendations:   Please see A&P under problem oriented charting for assessment of the patient's acute and chronic medical conditions.   As always, pt is advised that if symptoms worsen or new symptoms arise, they should go to an urgent care facility or to to ER for further evaluation.   Consent and Medical Decision Making:   Patient discussed with Dr. Beryle Beams  This is a telephone encounter between [name] and Asencion Noble on 10/29/2018 for hemorrhoids, HTN. The visit was conducted with the patient located at home and Asencion Noble at [facility]. The patient's identity was confirmed using their DOB and current address. The patient has consented to being evaluated through a telephone encounter and understands the associated risks (an examination cannot be done and the patient may need to come in for an appointment) / benefits (allows the patient to remain at home, decreasing exposure to coronavirus). I personally spent 20 minutes on medical discussion.

## 2018-10-30 ENCOUNTER — Telehealth: Payer: Self-pay | Admitting: *Deleted

## 2018-10-30 ENCOUNTER — Other Ambulatory Visit: Payer: Self-pay

## 2018-10-30 ENCOUNTER — Ambulatory Visit (INDEPENDENT_AMBULATORY_CARE_PROVIDER_SITE_OTHER): Payer: BLUE CROSS/BLUE SHIELD | Admitting: Internal Medicine

## 2018-10-30 DIAGNOSIS — K642 Third degree hemorrhoids: Secondary | ICD-10-CM

## 2018-10-30 DIAGNOSIS — I10 Essential (primary) hypertension: Secondary | ICD-10-CM | POA: Diagnosis not present

## 2018-10-30 DIAGNOSIS — M5412 Radiculopathy, cervical region: Secondary | ICD-10-CM | POA: Diagnosis not present

## 2018-10-30 DIAGNOSIS — K219 Gastro-esophageal reflux disease without esophagitis: Secondary | ICD-10-CM | POA: Diagnosis not present

## 2018-10-30 MED ORDER — CYCLOBENZAPRINE HCL 10 MG PO TABS: 10.0000 mg | ORAL_TABLET | Freq: Every day | ORAL | 0 refills | Status: DC

## 2018-10-30 MED FILL — CYCLOBENZAPRINE HCL 10 MG T: 10 | 15 days supply | Qty: 15 | Fill #0

## 2018-10-30 NOTE — Telephone Encounter (Signed)
Call from patient to ask where prescriptions had been sent to.  Dr. Sherry Ruffing was consulted.  Prescription for Cyclobenzaprine 10 mg tablets. Take 1 daily at bedtime dispense # 15 with no refills was called to the [facility].  Prescription for Nitroglycerin 0.125 mg ointment with Lidocaine 2% 3 times a day for 6-8 weeks wit no refills  was called to [facility] per order of Dr. Sherry Ruffing .  Patient was called and informed of the location of her medications.  Sander Nephew, RN 10/30/2018 2:50 PM.

## 2018-10-30 NOTE — Assessment & Plan Note (Signed)
This is a chronic issue. She was last seen on 05/09/18 where she reported recurrence of her hemorrhoids. She had seen GI and was recommended to use hydrocortisone ointment, nitroglycerin ouintment, RectiCare cream with lidocaide, sitz baths, and increasing fiber supplements. She has not seen GI recently, last saw them over 6 months ago, she reports that they would need to do a repeat colonoscopy.    Today she reports that every time she eats she has to go to the bathroom, increasing in frequency. She has been using her medications more often then usual. She has been using the hydrocortisone. She reports that she has a burning sensation around her rectum, needs to put a glove on and push it back in. She denies any bleeding. She reports soft stool. Given the recurrence of her symptoms and the fact that she needs to manually push the hemmerhoids back in we discussed that surgical intervention may be needed. She is open to this and I will put in a referral for surgery.   -Continue Sitz bath -Ingest 20-30 g of fiber, increase water intake to 1.5-2L per day -Avoid straining when defecating, increase exercise -Continue hydrocortisone cream rectally  -Will prescribe Nitroglycerin with lidocaine rectally -F/u with GI  -Surgical referral

## 2018-10-30 NOTE — Assessment & Plan Note (Signed)
She is on lisinopril 20 mg daily, and amlodipine 2.5 mg daily. Most recent BP was on 3/27 and it was 125/80. She reports that she has been taking her medications with no issues.   -Continue lisinopril 20 mg daily -Conitnue amlodipine 2.5 mg daily

## 2018-10-31 NOTE — Progress Notes (Signed)
Medicine attending: Medical history, presenting problems, physical complaints, and medications, reviewed with resident physician Dr Lonia Skinner on the day of the patient telephone consultation and I concur with her evaluation and management plan. Early recurrence of sxs related to chronic, prolapsing, rectal hemorrhoids. Continue symptomatic Rx w topical steroids. We will make surgery referral.

## 2018-11-04 ENCOUNTER — Other Ambulatory Visit: Payer: Self-pay

## 2018-11-04 ENCOUNTER — Ambulatory Visit (INDEPENDENT_AMBULATORY_CARE_PROVIDER_SITE_OTHER): Payer: BLUE CROSS/BLUE SHIELD | Admitting: Internal Medicine

## 2018-11-04 ENCOUNTER — Encounter: Payer: Self-pay | Admitting: Internal Medicine

## 2018-11-04 VITALS — Ht 59.0 in | Wt 138.0 lb

## 2018-11-04 DIAGNOSIS — Z91199 Patient's noncompliance with other medical treatment and regimen due to unspecified reason: Secondary | ICD-10-CM

## 2018-11-04 DIAGNOSIS — Z5329 Procedure and treatment not carried out because of patient's decision for other reasons: Secondary | ICD-10-CM

## 2018-11-04 NOTE — Progress Notes (Signed)
Patient NO SHOW for her scheduled WebEx appointment at 1:30 PM

## 2018-11-07 ENCOUNTER — Telehealth: Payer: Self-pay | Admitting: *Deleted

## 2018-11-07 NOTE — Telephone Encounter (Signed)
Call from pt - stated she's feeling dizzy; feels like she's walking on air. BP 103/82. Then she realized she took Flexeril instead  at bedtime. But told her to call us back if she has any other problems.

## 2018-11-11 MED FILL — LISINOPRIL 20 MG TABLET: 20 | 90 days supply | Qty: 90 | Fill #0

## 2018-11-11 MED FILL — AMLODIPINE 2.5 MG TABLET: 2.5 | 90 days supply | Qty: 90 | Fill #0

## 2018-11-13 ENCOUNTER — Telehealth: Payer: Self-pay

## 2018-11-13 NOTE — Telephone Encounter (Signed)
I contacted the [name] to reschedule f/u from March of 2020. [name] was agreeable to rescheduling, but wanted to check with her employer first and will call back.  Once [name] returns call please schedule f/u with Dr. Jannifer Franklin. This can be face to face.

## 2018-11-21 ENCOUNTER — Telehealth: Payer: Self-pay | Admitting: Internal Medicine

## 2018-11-21 NOTE — Telephone Encounter (Signed)
   Reason for call:   I received a call from [name] at 7:45 AM indicating that she is having issues with her hemorrhoids.    Pertinent Data:   Reports she is suppose to have surgery for her hemorrhoids but has not heard anything about it. She was treating it with a cream but this morning they prolapsed. She had to manually push them back in.   On chart review, appears she no showed her WebEx appointment with Dr. Henrene Pastor on 5/11.    Assessment / Plan / Recommendations:   Instructed patient to call back at 9 am when clinic opens to schedule an [facility] visit to be seen.   As always, pt is advised that if symptoms worsen or new symptoms arise, they should go to an urgent care facility or to to ER for further evaluation.   Ruth Ochs, MD   11/21/2018, 7:51 AM

## 2018-11-25 ENCOUNTER — Ambulatory Visit (INDEPENDENT_AMBULATORY_CARE_PROVIDER_SITE_OTHER): Payer: BLUE CROSS/BLUE SHIELD | Admitting: Internal Medicine

## 2018-11-25 ENCOUNTER — Other Ambulatory Visit: Payer: Self-pay

## 2018-11-25 ENCOUNTER — Telehealth: Payer: Self-pay | Admitting: *Deleted

## 2018-11-25 VITALS — BP 139/85 | HR 99 | Temp 98.9°F | Wt 139.4 lb

## 2018-11-25 DIAGNOSIS — K644 Residual hemorrhoidal skin tags: Secondary | ICD-10-CM

## 2018-11-25 MED ORDER — PRAMOXINE HCL (PERIANAL) 1 % EX FOAM: 1.0000 "application " | Freq: Three times a day (TID) | CUTANEOUS | 0 refills | Status: DC | PRN

## 2018-11-25 MED ORDER — CALCIUM POLYCARBOPHIL 625 MG PO TABS: 625.0000 mg | ORAL_TABLET | Freq: Every day | ORAL | 2 refills | Status: DC

## 2018-11-25 MED ORDER — PHENYLEPH-SHARK LIV OIL-MO-PET 0.25-3-14-71.9 % RE OINT: 1.0000 "application " | TOPICAL_OINTMENT | Freq: Two times a day (BID) | RECTAL | 0 refills | Status: DC | PRN

## 2018-11-25 NOTE — Progress Notes (Signed)
   CC: hemorrhoids   HPI:  [name] is a 58 y.o. female with PMHx listed below who presents for worsening hemorrhoids. She states she has had them for approximately 10 months, but in the last few weeks they have become progressively more bothersome. Endorses pruritis, increased urgency with BMs, and pain following a BM. Stools are mostly loose and watery.   She denies any n/v, abdominal pain, hematochezia, melena, weight changes.   Past Medical History:  Diagnosis Date  . Anemia   . Arthritis    "neck, hips" (04/25/2018)  . Claustrophobia   . Daily headache   . GERD (gastroesophageal reflux disease)   . Hiatal hernia   . Hyperlipidemia    "hx" (04/25/2018)  . Hypertension   . Plantar fasciitis   . Stroke [facility]) 05/2014   "mini stroke" (04/25/2018)   Review of Systems:  Review of Systems  All other systems reviewed and are negative.    Physical Exam:  Vitals:   11/25/18 0924  BP: 139/85  Pulse: 99  Temp: 98.9 F (37.2 C)  TempSrc: Oral  SpO2: 100%  Weight: 139 lb 6.4 oz (63.2 kg)   Physical Exam Exam conducted with a chaperone present.  Constitutional:      General: She is not in acute distress.    Appearance: Normal appearance.  Eyes:     Conjunctiva/sclera: Conjunctivae normal.  Cardiovascular:     Rate and Rhythm: Normal rate and regular rhythm.  Abdominal:     General: Bowel sounds are normal.     Palpations: Abdomen is soft.     Tenderness: There is no abdominal tenderness.     Comments: Rectal exam: external hemorrhoids without bleeding. Pain with DRE. normal sphincter tone.    Neurological:     Mental Status: She is alert.     Assessment & Plan:   See Encounters Tab for problem based charting.  Patient discussed with Dr. Angelia Mould

## 2018-11-25 NOTE — Patient Instructions (Addendum)
[name], It was a pleasure seeing you. I am sorry your hemorrhoids have given you such a difficult time. I am writing 2 additional creams for you to use. Ask your pharmacist if they are able to be bought over-the-counter, as they may be less expensive that way!  I'd also like you to try a bulk forming laxative (Fibercon), but if this makes your bowel movements worse, don't use it.    Continue using the steroid cream you already have, as well as soaking in Sitz baths like you've been doing.   The most important thing will be to get in to see you GI doctor as soon as you can.   Take care and please call with any additional questions or concerns.   Dr. Koleen Distance

## 2018-11-25 NOTE — Telephone Encounter (Signed)
Per pharmacy, the proctofoam otc and the proctofoam script are 2 different types- otc is just an anti itch type cream, 1% HC is more of a assist in healing. They do not know where you can get otc proctofoam. The only thing I can find on google is 71.00 of course the hc is 150.00 plus but insurance will possibly pay. Please advise or call the pharm at [phone] to discuss w/ pharmacist susan called

## 2018-11-27 ENCOUNTER — Encounter: Payer: Self-pay | Admitting: Internal Medicine

## 2018-11-27 NOTE — Assessment & Plan Note (Signed)
This has been a chronic issue for her, but endorses progression in symptoms.  She has been using hydrocortisone cream with moderate relief. I encouraged her to see her gastroenterologist as soon as possible. May need surgical intervention given severity and persistence of symptoms.  Will continue symptomatic management for now. She was encouraged to continue Sitz baths, hydrocortisone cream. Will add bulk forming laxative, preparation H and proctofoam.

## 2018-11-29 NOTE — Progress Notes (Signed)
Internal Medicine Clinic Attending  Case discussed with Dr. Bloomfield at the time of the visit.  We reviewed the resident's history and exam and pertinent patient test results.  I agree with the assessment, diagnosis, and plan of care documented in the resident's note.  

## 2018-12-06 ENCOUNTER — Encounter: Payer: Self-pay | Admitting: Internal Medicine

## 2018-12-06 ENCOUNTER — Other Ambulatory Visit: Payer: Self-pay

## 2018-12-06 ENCOUNTER — Telehealth: Payer: Self-pay | Admitting: *Deleted

## 2018-12-06 ENCOUNTER — Ambulatory Visit (INDEPENDENT_AMBULATORY_CARE_PROVIDER_SITE_OTHER): Payer: BLUE CROSS/BLUE SHIELD | Admitting: Internal Medicine

## 2018-12-06 DIAGNOSIS — K644 Residual hemorrhoidal skin tags: Secondary | ICD-10-CM | POA: Diagnosis not present

## 2018-12-06 NOTE — Assessment & Plan Note (Signed)
HPI: [name] is a 58 y.o. female with the medical conditions listed below who called the internal medicine clinic for bright red bleeding on the toilet paper after a BM this morning. She has a history of severe external hemorrhoids and was seen for this problem in clinic on 6/1. She has been doing sitz baths, hydrocortisone cream, laxatives, preparation H, and proctofoam. Her discomfort has not worsened. She has an appointment with GI on 6/24 to discuss surgical intervention. This morning was the first time she's noticed any blood. There was no blood on her stool or in the toilet paper. She hasn't had any further bleeding since that episode. She denies dizziness and dyspnea. She had some abdominal cramping surrounding her BM, but she took miralax yesterday.   Assessment: Scant bleeding consistent with hemorrhoids. Advised patient to continue with her current therapies and upcoming appointment with GI. Advised patient to call back if she begins to experience greater volume of blood with or without BMs.

## 2018-12-06 NOTE — Progress Notes (Signed)
   This is a telephone encounter between [name] and [place] on  12/06/2018 for hemorrhoids. The visit was conducted with the patient located at home and [name] at Home. The patient's identity was confirmed using their DOB and current address. The patient has consented to being evaluated through a telephone encounter and understands the associated risks/benefits. I personally spent 6 minutes on medical discussion.   HPI:   [name] is a 58 y.o. female with the medical conditions listed below who called the internal medicine clinic for bright red bleeding on the toilet paper after a BM this morning. She has a history of severe external hemorrhoids and was seen for this problem in clinic on 6/1. She has been doing sitz baths, hydrocortisone cream, laxatives, preparation H, and proctofoam. Her discomfort has not worsened. She has an appointment with GI on 6/24 to discuss surgical intervention. This morning was the first time she's noticed any blood. There was no blood on her stool or in the toilet paper. She hasn't had any further bleeding since that episode. She denies dizziness and dyspnea. She had some abdominal cramping surrounding her BM, but she took miralax yesterday. Please see problem based charting for the assessment and plan.   Past Medical History:  Diagnosis Date  . Anemia   . Arthritis    "neck, hips" (04/25/2018)  . Claustrophobia   . Daily headache   . GERD (gastroesophageal reflux disease)   . Hiatal hernia   . Hyperlipidemia    "hx" (04/25/2018)  . Hypertension   . Plantar fasciitis   . Stroke [facility]) 05/2014   "mini stroke" (04/25/2018)    Review of Systems:   Pertinent positives mentioned in HPI. Remainder of all ROS negative.   Assessment & Plan:   Patient discussed with Dr. Daryll Drown

## 2018-12-06 NOTE — Telephone Encounter (Signed)
Patient called in states she is still having BRB on tissue when she wipes after a BM. States BMs are soft. Reviewed advice from 11/25/2018 OV: sitz bath, fibercon and Preparation H. Patient has an appt with GI on 12/18/2018. Patient is concerned because now she has burning pain in stomach and nausea. Will place on telehealth schedule for today. Hubbard Hartshorn, RN, BSN

## 2018-12-09 NOTE — Telephone Encounter (Signed)
thanks

## 2018-12-10 NOTE — Progress Notes (Signed)
Internal Medicine Clinic Attending  Case discussed with Dr. Annie Paras soon after the resident saw the patient.  We reviewed the resident's history, telephone conversation and pertinent patient test results.  I agree with the assessment, diagnosis, and plan of care documented in the resident's note.

## 2018-12-13 ENCOUNTER — Telehealth: Payer: Self-pay | Admitting: Internal Medicine

## 2018-12-13 NOTE — Telephone Encounter (Signed)
Spoke with patient she stated that she had some bleeding when she went to the bathroom she is needing to speak with the nurse for advice.

## 2018-12-13 NOTE — Telephone Encounter (Signed)
Patient called and states she had dark red blood in her stool today, instead of just when she wipes. She has no other symptoms; no fever/dizzines/SOB. She wanted to know if her phone visit on Wed. 12/18/18 at 1:30pm could be changed to an In-Person visit. I told patient if she did start losing more blood or have any of the above symptoms over the weekend, to go to the ED

## 2018-12-16 NOTE — Telephone Encounter (Signed)
Scheduled patient for In Person visit with Tye Savoy NP 12/20/18 at 2:00pm (per Dr. Blanch Media request) and cancelled patient's virtual visit. Patient called and notified.

## 2018-12-16 NOTE — Telephone Encounter (Signed)
Please schedule an in person office visit with one of our advanced practitioners (Amy and Nevin Bloodgood currently seeing patients in the office).  I will be the supervising physician.  As such, cancel my virtual visit with the patient.  Thanks.

## 2018-12-16 NOTE — Telephone Encounter (Signed)
Left message for patient to call back  

## 2018-12-18 ENCOUNTER — Ambulatory Visit: Payer: BLUE CROSS/BLUE SHIELD | Admitting: Internal Medicine

## 2018-12-20 ENCOUNTER — Ambulatory Visit: Payer: BLUE CROSS/BLUE SHIELD | Admitting: Nurse Practitioner

## 2018-12-23 ENCOUNTER — Ambulatory Visit: Payer: BLUE CROSS/BLUE SHIELD | Admitting: Physician Assistant

## 2019-01-05 ENCOUNTER — Ambulatory Visit (HOSPITAL_COMMUNITY)
Admission: EM | Admit: 2019-01-05 | Discharge: 2019-01-05 | Disposition: A | Discharge status: Home / Self Care | Payer: BLUE CROSS/BLUE SHIELD | Attending: Emergency Medicine | Admitting: Emergency Medicine

## 2019-01-05 ENCOUNTER — Other Ambulatory Visit: Payer: Self-pay

## 2019-01-05 ENCOUNTER — Encounter (HOSPITAL_COMMUNITY): Payer: Self-pay

## 2019-01-05 DIAGNOSIS — K0889 Other specified disorders of teeth and supporting structures: Secondary | ICD-10-CM | POA: Diagnosis not present

## 2019-01-05 MED ORDER — AMOXICILLIN 500 MG PO CAPS: 500.0000 mg | ORAL_CAPSULE | Freq: Three times a day (TID) | ORAL | 0 refills | Status: AC

## 2019-01-05 MED ORDER — HYDROCODONE-ACETAMINOPHEN 5-325 MG PO TABS: 1.0000 | ORAL_TABLET | Freq: Four times a day (QID) | ORAL | 0 refills | Status: DC | PRN

## 2019-01-05 NOTE — ED Provider Notes (Signed)
[place]    CSN: [id number] Arrival date & time: 01/05/19  1031      History   Chief Complaint Chief Complaint  Patient presents with  . Oral Swelling    HPI [name] is a 58 y.o. female history of previous TIA, hypertension, hyperlipidemia, tobacco use, GERD, presenting today for evaluation of dental pain.  Patient states that she has had dental pain to her right upper jaw.  Symptoms have been intermittent, but over the past 24 hours have worsened.  Notes that she previously was on an antibiotic and had plans to have a tooth removed, but was unable to make her appointment and had to reschedule extraction.  Of recently she feels that the infection has come back.  She has been using ibuprofen 800 mg without relief.  Denies swelling and fevers.  Denies difficulty swallowing or neck stiffness.  HPI  Past Medical History:  Diagnosis Date  . Anemia   . Arthritis    "neck, hips" (04/25/2018)  . Claustrophobia   . Daily headache   . GERD (gastroesophageal reflux disease)   . Hiatal hernia   . Hyperlipidemia    "hx" (04/25/2018)  . Hypertension   . Plantar fasciitis   . Stroke [facility]) 05/2014   "mini stroke" (04/25/2018)    Patient Active Problem List   Diagnosis Date Noted  . Acute headache 04/02/2018  . External hemorrhoids 09/14/2017  . Radicular pain of left lower extremity 07/07/2016  . H/O TIA (transient ischemic attack) and stroke 06/12/2014  . Cervical radiculitis 02/12/2014  . Healthcare maintenance 04/30/2013  . Essential hypertension, benign 01/24/2010  . Anemia 01/11/2010  . Hyperlipemia 05/28/2007  . TOBACCO ABUSE 05/28/2007  . GERD 05/28/2007    Past Surgical History:  Procedure Laterality Date  . ABDOMINAL HYSTERECTOMY  12/09   Secondary to fibroids  . ARTERY BIOPSY Right 04/10/2018   Procedure: BIOPSY TEMPORAL ARTERY;  Surgeon: Rosetta Posner, MD;  Location: [place];  Service: Vascular;  Laterality: Right;  . [place];  1988  . FOOT SURGERY Left 03/2017   Bone Spurs Removed  . RADIOLOGY WITH ANESTHESIA N/A 04/26/2018   Procedure: MRI WITH ANESTHESIA;  Surgeon: Radiologist, Medication, MD;  Location: [place];  Service: Radiology;  Laterality: N/A;  . TUBAL LIGATION  1988    OB History   No obstetric history on file.      Home Medications    Prior to Admission medications   Medication Sig Start Date End Date Taking? Authorizing Provider  acetaminophen (TYLENOL) 500 MG tablet Take 1,000 mg by mouth every 6 (six) hours as needed for moderate pain or headache.    [provider]  AMBULATORY NON FORMULARY MEDICATION Medication Name: nitroglycerin 0.125 mg ointment mixed with 2% lidocaine three times a day for 6-8 weeks 05/09/18   Bartholomew Crews, MD  amLODipine (NORVASC) 2.5 MG tablet TAKE 1 TABLET BY MOUTH DAILY. 06/27/18   Bartholomew Crews, MD  amoxicillin (AMOXIL) 500 MG capsule Take 1 capsule (500 mg total) by mouth 3 (three) times daily for 7 days. 01/05/19 01/12/19  Yazhini Mcaulay C, PA-C  carbamazepine (CARBATROL) 200 MG 12 hr capsule Take 1 capsule (200 mg total) by mouth 2 (two) times daily. 05/09/18   Bartholomew Crews, MD  cyclobenzaprine (FLEXERIL) 10 MG tablet Take 1 tablet (10 mg total) by mouth at bedtime. 10/30/18   Asencion Noble, MD  ferrous sulfate (IRON SUPPLEMENT) 325 (65 FE) MG tablet Take  1 tablet (325 mg total) by mouth daily with breakfast. 03/01/16   Bartholomew Crews, MD  HYDROcodone-acetaminophen (NORCO/VICODIN) 5-325 MG tablet Take 1 tablet by mouth every 6 (six) hours as needed. 01/05/19   Sevag Shearn C, PA-C  hydrocortisone (ANUSOL-HC) 2.5 % rectal cream Place 1 application rectally 2 (two) times daily. 05/09/18   Bartholomew Crews, MD  lisinopril (PRINIVIL,ZESTRIL) 20 MG tablet TAKE 1 TABLET BY MOUTH DAILY. 06/24/18   Bartholomew Crews, MD  omeprazole (PRILOSEC) 40 MG capsule Take 1 capsule (40 mg total) by mouth daily. 05/09/18   Bartholomew Crews,  MD  pantoprazole (PROTONIX) 40 MG tablet Take 1 tablet (40 mg total) by mouth daily. 09/20/18   Bartholomew Crews, MD  phenylephrine-shark liver oil-mineral oil-petrolatum (PREPARATION H) 0.25-3-14-71.9 % rectal ointment Place 1 application rectally 2 (two) times daily as needed for hemorrhoids. 11/25/18   [place], Carley D, DO  polycarbophil (FIBERCON) 625 MG tablet Take 1 tablet (625 mg total) by mouth daily. 11/25/18 11/25/19  [place], Carley D, DO  pramoxine (PROCTOFOAM) 1 % foam Place 1 application rectally 3 (three) times daily as needed for anal itching. 11/25/18   Modena Nunnery D, DO    Family History Family History  Problem Relation Age of Onset  . Cancer Mother 21       pancreatic cancer  . Pancreatic cancer Mother   . Heart disease Mother   . Cancer Father 82       throat cancer  . Cancer Maternal Aunt 60       breast cancer  . Cancer Other        Died  from Colon cancer in 76's  . Colon cancer Maternal Uncle   . Colon cancer Paternal Uncle   . Heart disease Sister     Social History Social History   Tobacco Use  . Smoking status: Current Some Day Smoker    Packs/day: 0.00    Years: 38.00    Pack years: 0.00    Types: Cigarettes  . Smokeless tobacco: Never Used  . Tobacco comment: 1.5  cigarettes daily  Substance Use Topics  . Alcohol use: Yes    Comment: 2 beers on weekend  . Drug use: Not Currently     Allergies   Aspirin and Hydromorphone hcl   Review of Systems Review of Systems  Constitutional: Negative for activity change, appetite change, chills, fatigue and fever.  HENT: Positive for dental problem. Negative for congestion, ear pain, facial swelling, rhinorrhea, sinus pressure, sore throat and trouble swallowing.   Eyes: Negative for discharge and redness.  Respiratory: Negative for cough, chest tightness and shortness of breath.   Cardiovascular: Negative for chest pain.  Gastrointestinal: Negative for abdominal pain, diarrhea, nausea and  vomiting.  Musculoskeletal: Negative for myalgias.  Skin: Negative for rash.  Neurological: Negative for dizziness, light-headedness and headaches.     Physical Exam Triage Vital Signs ED Triage Vitals  Enc Vitals Group     BP 01/05/19 1115 (!) 166/99     Pulse Rate 01/05/19 1115 79     Resp 01/05/19 1115 16     Temp 01/05/19 1115 98.6 F (37 C)     Temp Source 01/05/19 1115 Oral     SpO2 01/05/19 1115 100 %     Weight --      Height --      Head Circumference --      Peak Flow --      Pain Score 01/05/19  1116 10     Pain Loc --      Pain Edu? --      Excl. in [place]? --    No data found.  Updated Vital Signs BP (!) 166/99 (BP Location: Right Arm)   Pulse 79   Temp 98.6 F (37 C) (Oral)   Resp 16   SpO2 100%   Visual Acuity Right Eye Distance:   Left Eye Distance:   Bilateral Distance:    Right Eye Near:   Left Eye Near:    Bilateral Near:     Physical Exam Vitals signs and nursing note reviewed.  Constitutional:      General: She is not in acute distress.    Appearance: She is well-developed.  HENT:     Head: Normocephalic and atraumatic.     Comments: No obvious facial swelling    Mouth/Throat:     Comments: Dentition in poor repair, posterior molar to right upper jaw with decay, surrounding gingival erythema and tenderness  Posterior pharynx patent, no soft palate swelling, uvula midline, no swelling below the tongue Eyes:     Conjunctiva/sclera: Conjunctivae normal.  Neck:     Musculoskeletal: Neck supple.     Comments: Full active range of motion of neck, no overlying neck erythema or swelling Cardiovascular:     Rate and Rhythm: Normal rate and regular rhythm.     Heart sounds: No murmur.  Pulmonary:     Effort: Pulmonary effort is normal. No respiratory distress.     Breath sounds: Normal breath sounds.  Abdominal:     Palpations: Abdomen is soft.     Tenderness: There is no abdominal tenderness.  Skin:    General: Skin is warm and dry.   Neurological:     Mental Status: She is alert.      UC Treatments / Results  Labs (all labs ordered are listed, but only abnormal results are displayed) Labs Reviewed - No data to display  EKG   Radiology No results found.  Procedures Procedures (including critical care time)  Medications Ordered in UC Medications - No data to display  Initial Impression / Assessment and Plan / UC Course  I have reviewed the triage vital signs and the nursing notes.  Pertinent labs & imaging results that were available during my care of the patient were reviewed by me and considered in my medical decision making (see chart for details).     Will cover patient for infection, will readmit initiate on amoxicillin x1 week.  To provide a few tablets of hydrocodone to use for severe pain.  Discussed drowsiness regarding this.  Tylenol and ibuprofen for mild to moderate pain.  Follow-up with dentistry as planned.  No signs of deep space infection, vital signs stable.Discussed strict return precautions. Patient verbalized understanding and is agreeable with plan.  Final Clinical Impressions(s) / UC Diagnoses   Final diagnoses:  Pain, dental     Discharge Instructions     Follow up with dentistry  Today we have given you an antibiotic. This should help with pain as any infection is cleared.   For pain please take 600mg -800mg  of Ibuprofen every 8 hours, take with 1000 mg of Tylenol Extra strength every 8 hours. These are safe to take together. Please take with food.   I have also provided 2 days worth of stronger pain medication. This should only be used for severe pain. Do not drive or operate machinery while taking this medication.   Please  return if you start to experience significant swelling of your face, experiencing fever.   ED Prescriptions    Medication Sig Dispense Auth. Provider   amoxicillin (AMOXIL) 500 MG capsule Take 1 capsule (500 mg total) by mouth 3 (three) times daily  for 7 days. 21 capsule Tina Gruner C, PA-C   HYDROcodone-acetaminophen (NORCO/VICODIN) 5-325 MG tablet Take 1 tablet by mouth every 6 (six) hours as needed. 8 tablet Felissa Blouch, [place] C, PA-C     Controlled Substance Prescriptions [place] Controlled Substance Registry consulted? Yes, I have consulted the [place] Controlled Substances Registry for this patient, and feel the risk/benefit ratio today is favorable for proceeding with this prescription for a controlled substance.   Janith Lima, [place] 01/05/19 2150

## 2019-01-05 NOTE — ED Triage Notes (Signed)
Pt C/o dental pain on her right side. Symptoms been going on for over a month but the pain became worst this morning.

## 2019-01-05 NOTE — Discharge Instructions (Signed)
Follow up with dentistry  Today we have given you an antibiotic. This should help with pain as any infection is cleared.   For pain please take 600mg -800mg  of Ibuprofen every 8 hours, take with 1000 mg of Tylenol Extra strength every 8 hours. These are safe to take together. Please take with food.   I have also provided 2 days worth of stronger pain medication. This should only be used for severe pain. Do not drive or operate machinery while taking this medication.   Please return if you start to experience significant swelling of your face, experiencing fever.

## 2019-01-23 MED FILL — OMEPRAZOLE DR 40 MG CAPSULE: 40 | 30 days supply | Qty: 30 | Fill #0

## 2019-02-04 ENCOUNTER — Encounter: Payer: Self-pay | Admitting: *Deleted

## 2019-02-17 ENCOUNTER — Ambulatory Visit (INDEPENDENT_AMBULATORY_CARE_PROVIDER_SITE_OTHER): Payer: BLUE CROSS/BLUE SHIELD | Admitting: Internal Medicine

## 2019-02-17 ENCOUNTER — Encounter: Payer: Self-pay | Admitting: Internal Medicine

## 2019-02-17 ENCOUNTER — Other Ambulatory Visit: Payer: Self-pay

## 2019-02-17 ENCOUNTER — Telehealth: Payer: Self-pay

## 2019-02-17 DIAGNOSIS — K0889 Other specified disorders of teeth and supporting structures: Secondary | ICD-10-CM

## 2019-02-17 NOTE — Telephone Encounter (Signed)
Called pt - stated she a toothache and she had to leave work d/t the pain. She has an appt with her PCP on Wed but stated she needs something now so she can go back to work. Requesting something for the pain and an abx. Informed she needs to be seen / evaluated - [facility] appt given to today @ 1015 AM.

## 2019-02-17 NOTE — Telephone Encounter (Signed)
Pt states she is having toothache, requesting antibiotic. Please call pt back.

## 2019-02-17 NOTE — Assessment & Plan Note (Signed)
Dental pain: [name] initially presented to the urgent care on January 05, 2019 with dental pain and was started on amoxicillin.  Since then, she has been evaluated by a dentist who prescribed another round of antibiotics as well as analgesics (ibuprofen and Tylenol).  [name] states that the analgesics have not been alleviating her pain as she woke up at 3 AM this morning with the same dental pain.  She denies fevers or chills.  On physical exams, I was not able to appreciate any pocket of fluid on the right superior bucchal surface.    Plan: - Advised to make urgent visit to see dentist as the ultimate fix for her pain is dental surgery -I advised [name] to continue alternating between her ibuprofen and Tylenol but unfortunately she got a little upset and left the clinic

## 2019-02-17 NOTE — Progress Notes (Signed)
   CC: Dental pain  HPI:  [name] is a 58 y.o. African-American woman who has been recently suffering from dental pain presenting with similar complaints.  Please see problem based charting for further details.  Past Medical History:  Diagnosis Date  . Anemia   . Arthritis    "neck, hips" (04/25/2018)  . Claustrophobia   . Daily headache   . GERD (gastroesophageal reflux disease)   . Hiatal hernia   . Hyperlipidemia    "hx" (04/25/2018)  . Hypertension   . Plantar fasciitis   . Stroke [facility]) 05/2014   "mini stroke" (04/25/2018)   Review of Systems: As per HPI  Physical Exam:  Vitals:   02/17/19 0949  BP: (!) 163/95  Pulse: 80  Temp: 98.3 F (36.8 C)  TempSrc: Oral  SpO2: 99%  Weight: 135 lb 14.4 oz (61.6 kg)  Height: 4\' 11"  (1.499 m)   Physical Exam  Constitutional: She is well-developed, well-nourished, and in no distress.  HENT:  Head: Normocephalic and atraumatic.  Mouth/Throat: Oropharynx is clear and moist. No oropharyngeal exudate.  Very minimal dental caries on the lower right teeth  Neck: Neck supple.    Assessment & Plan:   See Encounters Tab for problem based charting.  Patient discussed with Dr. Daryll Drown

## 2019-02-18 NOTE — Progress Notes (Signed)
Internal Medicine Clinic Attending  Case discussed with Dr. Agyei at the time of the visit.  We reviewed the resident's history and exam and pertinent patient test results.  I agree with the assessment, diagnosis, and plan of care documented in the resident's note.    

## 2019-02-19 ENCOUNTER — Ambulatory Visit (INDEPENDENT_AMBULATORY_CARE_PROVIDER_SITE_OTHER): Payer: BLUE CROSS/BLUE SHIELD | Admitting: Internal Medicine

## 2019-02-19 ENCOUNTER — Other Ambulatory Visit: Payer: Self-pay

## 2019-02-19 VITALS — BP 150/97 | HR 90 | Temp 98.6°F | Wt 130.7 lb

## 2019-02-19 DIAGNOSIS — M5489 Other dorsalgia: Secondary | ICD-10-CM | POA: Diagnosis not present

## 2019-02-19 DIAGNOSIS — K0889 Other specified disorders of teeth and supporting structures: Secondary | ICD-10-CM

## 2019-02-19 DIAGNOSIS — Z0000 Encounter for general adult medical examination without abnormal findings: Secondary | ICD-10-CM

## 2019-02-19 DIAGNOSIS — K219 Gastro-esophageal reflux disease without esophagitis: Secondary | ICD-10-CM

## 2019-02-19 DIAGNOSIS — M533 Sacrococcygeal disorders, not elsewhere classified: Secondary | ICD-10-CM

## 2019-02-19 DIAGNOSIS — E348 Other specified endocrine disorders: Secondary | ICD-10-CM

## 2019-02-19 DIAGNOSIS — I10 Essential (primary) hypertension: Secondary | ICD-10-CM | POA: Diagnosis not present

## 2019-02-19 DIAGNOSIS — Z792 Long term (current) use of antibiotics: Secondary | ICD-10-CM

## 2019-02-19 DIAGNOSIS — D649 Anemia, unspecified: Secondary | ICD-10-CM

## 2019-02-19 DIAGNOSIS — E785 Hyperlipidemia, unspecified: Secondary | ICD-10-CM

## 2019-02-19 MED ORDER — AMOXICILLIN 500 MG PO CAPS: 500.0000 mg | ORAL_CAPSULE | Freq: Three times a day (TID) | ORAL | 0 refills | Status: DC

## 2019-02-19 MED FILL — AMOXICILLIN 500 MG CAPSULE: 500 | 9 days supply | Qty: 28 | Fill #0

## 2019-02-19 MED FILL — OMEPRAZOLE DR 40 MG CAPSULE: 40 | 30 days supply | Qty: 30 | Fill #1

## 2019-02-19 MED FILL — AMLODIPINE 2.5 MG TABLET: 2.5 | 30 days supply | Qty: 30 | Fill #0

## 2019-02-19 MED FILL — LISINOPRIL 20 MG TABLET: 20 | 30 days supply | Qty: 30 | Fill #0

## 2019-02-19 NOTE — Progress Notes (Signed)
   Subjective:    Patient ID: [name], female    DOB: 1961-03-27, 58 y.o.   MRN: [id number]  HPI  WAUNDA MOHAN is here for tooth pain. Please see the A&P for the status of the pt's chronic medical problems.  ROS : per ROS section and in problem oriented charting. All other systems are negative.  PMHx includes hypertension, hyperlipidemia, anemia, and GERD  Review of Systems  Constitutional: Negative for fever.  HENT: Positive for dental problem.   Musculoskeletal: Positive for myalgias.       Objective:   Physical Exam Vitals signs and nursing note reviewed.  Constitutional:      Appearance: Normal appearance.  HENT:     Head: Normocephalic and atraumatic.     Right Ear: External ear normal.     Left Ear: External ear normal.     Nose: Nose normal. No congestion or rhinorrhea.     Mouth/Throat:     Mouth: Mucous membranes are moist.     Pharynx: Oropharynx is clear.     Comments: Right upper shows a missing tooth with swelling, no drainage, no erythema Eyes:     General: No scleral icterus.       Right eye: No discharge.        Left eye: No discharge.     Conjunctiva/sclera: Conjunctivae normal.  Musculoskeletal:     Comments: No tenderness over the inferior lumbar spine and tailbone  Skin:    General: Skin is warm and dry.  Neurological:     General: No focal deficit present.     Mental Status: She is alert. Mental status is at baseline.     Comments: Gait normal  Psychiatric:        Mood and Affect: Mood normal.        Behavior: Behavior normal.        Thought Content: Thought content normal.        Judgment: Judgment normal.       Assessment & Plan:

## 2019-02-19 NOTE — Patient Instructions (Signed)
I sent your medicine to the pharmacy. Make an appt with your dentist

## 2019-02-19 NOTE — Assessment & Plan Note (Signed)
This problem is subacute.  A tooth on the right upper jaw broke and caused a filling to fall out.  It is painful, swollen, and causes her difficulty chewing.  She got a course of clindamycin but it was too strong. She saw her dentist who said it was still infected. The patient states the infection was diagnosed with an  x-ray.  She was prescribed a 7-day course of amoxicillin and then return to see the dentist.  She was told it was still infected which the patient stated was determined when she tried to inject the area to numb it.  She tried to call the dentist who is out this week but apparently they have a message to the dentist about refilling the antibiotics.  The patient states that she cannot see the dentist until next week and an appointment will not be scheduled until she is back on antibiotics.  There is swelling on examination and she is in pain.  Despite already having at least 2 courses of antibiotics, I will go ahead and prescribe amoxicillin 500 mg twice daily for 14 days and instructed her to contact the dentist to make an appointment.  I stressed that she may not need the full 14 days of antibiotics but to check in with her dentist.  PLAN: Amoxicillin, follow-up with dentist, ibuprofen and acetaminophen for pain control

## 2019-02-19 NOTE — Assessment & Plan Note (Signed)
This problem is new.  [place]  The chair tipped over and she landed on the ground on her buttocks.  She did not have any pain until 2 days ago, the 24th.  The pain is only when she sits down.  There is no pain when she stands walks or palpates the area.  The fall was only from chair height and was on 2 dirt rather than a harder surface.  I do not think she broke a bone.  I cannot explain why the pain was delayed by a week.  We will observe at this point.  PLAN:  Follow

## 2019-02-26 MED FILL — CLINDAMYCIN HCL 150 MG CAPS: 150 | 7 days supply | Qty: 28 | Fill #0

## 2019-02-26 MED FILL — HYDROCODON-APAP 5-325: 5-325 | 5 days supply | Qty: 20 | Fill #0

## 2019-02-27 ENCOUNTER — Ambulatory Visit: Payer: BLUE CROSS/BLUE SHIELD | Admitting: Internal Medicine

## 2019-03-14 MED FILL — AMLODIPINE 2.5 MG TABLET: 2.5 | 30 days supply | Qty: 30 | Fill #1

## 2019-03-14 MED FILL — OMEPRAZOLE DR 40 MG CAPSULE: 40 | 30 days supply | Qty: 30 | Fill #2

## 2019-03-14 MED FILL — LISINOPRIL 20 MG TABLET: 20 | 30 days supply | Qty: 30 | Fill #1

## 2019-03-18 ENCOUNTER — Ambulatory Visit (INDEPENDENT_AMBULATORY_CARE_PROVIDER_SITE_OTHER): Payer: BLUE CROSS/BLUE SHIELD | Admitting: *Deleted

## 2019-03-18 ENCOUNTER — Other Ambulatory Visit: Payer: Self-pay

## 2019-03-18 DIAGNOSIS — Z23 Encounter for immunization: Secondary | ICD-10-CM

## 2019-03-19 ENCOUNTER — Telehealth: Payer: Self-pay

## 2019-03-19 NOTE — Telephone Encounter (Signed)
Pt states she has been overdoing it at work, she is flaring , the coccyx pain and it runs down her R leg to her big toe, she is asking for some pain med, she states tell dr Software engineer "its just like before"

## 2019-03-19 NOTE — Telephone Encounter (Signed)
Requesting to speak with a nurse about nerve/back pain. Please call pt back.

## 2019-03-20 MED ORDER — GABAPENTIN 100 MG PO CAPS: 100.0000 mg | ORAL_CAPSULE | Freq: Three times a day (TID) | ORAL | 0 refills | Status: DC

## 2019-03-20 MED ORDER — HYDROCODONE-ACETAMINOPHEN 5-325 MG PO TABS: 1.0000 | ORAL_TABLET | Freq: Four times a day (QID) | ORAL | 0 refills | Status: DC | PRN

## 2019-03-20 MED FILL — GABAPENTIN 100 MG CAPSULE: 100 | 10 days supply | Qty: 30 | Fill #0

## 2019-03-20 MED FILL — HYDROCODON-APAP 5-325: 5-325 | 4 days supply | Qty: 15 | Fill #0

## 2019-03-20 NOTE — Telephone Encounter (Signed)
In March of this year, she had radicular pain down her left leg.  She had not gotten relief with Tylenol, ibuprofen, Mobic, Flexeril, gabapentin, muscle relaxants.  I prescribed 15 hydrocodone 5 mg and heard nothing further.  She states that yesterday, she was at work and had sudden onset of a very similar pain but this time down her right leg.  It runs down the back of her leg and shoots into her toes.  She denies incontinence or numbness in the perineal region.  She was able to work today but she had to have help as she was unable to bend or lift.  She took ibuprofen without any help.  She has to work tomorrow but then has the next two days off.  She has never had a lumbar MRI because her episodes have been self-limited without red flags.  I think she has S1-S2 right radiculopathy without red flags.  I am prescribing gabapentin 100 3 times daily and she is to take the first dose when she gets home today to see if it is sedating for her.  During the day, she can use ibuprofen and acetaminophen.  I am also prescribing hydrocodone 5 mg 15 pills to be used at bedtime or when he is not working or driving.

## 2019-04-21 MED FILL — OMEPRAZOLE DR 40 MG CAPSULE: 40 | 30 days supply | Qty: 30 | Fill #3

## 2019-04-21 MED FILL — AMLODIPINE 2.5 MG TABLET: 2.5 | 30 days supply | Qty: 30 | Fill #2

## 2019-04-21 MED FILL — LISINOPRIL 20 MG TABLET: 20 | 30 days supply | Qty: 30 | Fill #2

## 2019-04-25 ENCOUNTER — Ambulatory Visit (INDEPENDENT_AMBULATORY_CARE_PROVIDER_SITE_OTHER): Payer: BLUE CROSS/BLUE SHIELD | Admitting: Internal Medicine

## 2019-04-25 ENCOUNTER — Other Ambulatory Visit: Payer: Self-pay

## 2019-04-25 DIAGNOSIS — M25552 Pain in left hip: Secondary | ICD-10-CM

## 2019-04-25 DIAGNOSIS — M25559 Pain in unspecified hip: Secondary | ICD-10-CM

## 2019-04-25 DIAGNOSIS — M7062 Trochanteric bursitis, left hip: Secondary | ICD-10-CM | POA: Diagnosis not present

## 2019-04-25 NOTE — Assessment & Plan Note (Signed)
Patient presents with left hip pain.  Hip pain began 2 weeks ago and gets worse with movement better with rest.  Patient does not associate the pain with any time of day.  She is unable to lay on her left side at night when sleeping.  Point tenderness over the left trochanter bursa.   Assessment: Left greater trochanteric pain syndrome Plan:  -Steroid injection of the left hip -Patient given follow-up precautions

## 2019-04-25 NOTE — Progress Notes (Addendum)
   CC: Left hip pain  HPI:  [name] is a 58 y.o. with past medical history below who presents with left hip pain. Please see problem based charting for details of presentation, assessment, and plan.    Past Medical History:  Diagnosis Date  . Anemia   . Arthritis    "neck, hips" (04/25/2018)  . Claustrophobia   . Daily headache   . GERD (gastroesophageal reflux disease)   . Hiatal hernia   . Hyperlipidemia    "hx" (04/25/2018)  . Hypertension   . Plantar fasciitis   . Stroke [facility]) 05/2014   "mini stroke" (04/25/2018)   Review of Systems:  Review of Systems  Constitutional: Negative for chills and fever.  Musculoskeletal: Negative for back pain, falls and joint pain (other joint pain).       Left hip pain  Neurological: Negative for loss of consciousness and weakness.     Physical Exam:  Vitals:   04/25/19 0919  BP: (!) 155/93  Pulse: 77  Temp: 98.5 F (36.9 C)  TempSrc: Oral  SpO2: 93%  Weight: 128 lb 4.8 oz (58.2 kg)  Height: 4\' 11"  (1.499 m)   Physical Exam Constitutional:      General: She is in acute distress.     Appearance: Normal appearance.  Cardiovascular:     Heart sounds: Normal heart sounds. No murmur.  Pulmonary:     Breath sounds: No wheezing, rhonchi or rales.  Musculoskeletal:        General: Tenderness (tenderness over L.Greater trochanter.  no pain on palpation in groin) present. No swelling or deformity.  Skin:    General: Skin is warm and dry.  Neurological:     General: No focal deficit present.     Motor: No weakness.  Psychiatric:        Mood and Affect: Mood normal.        Behavior: Behavior normal.      Assessment & Plan:   See Encounters Tab for problem based charting.  Patient seen with Dr. Jearld Adjutant Injection Note  Pre-operative Diagnosis: left hip  Indications: Trochanteric bursitis pain syndrome  Anesthesia: lidocaine spray without added sodium bicarbonate  Procedure Details    Point of care ultrasound was used to identify the trochanteric bursa and evaluate for glut medius/minimus tendinopathy or tears. Consent was obtained for the procedure. The lateral hip was prepped with iodine. Using a 22 gauge needle the trochanteric bursa was injected with 2 mL 1% lidocaine and 1 mL of triamcinolone (KENALOG) 40mg /ml. The needle was removed and a dressing was applied.  Complications:  None; patient tolerated the procedure well.

## 2019-04-25 NOTE — Patient Instructions (Addendum)
Thank you for trusting me with your care. To recap, today we discussed the following:  Greater Trochanteric Pain Syndrome - Today you received a steroid injection in your left hip - If your hip becomes hot, red, or swollen after the procedure please contact the clinic or after hours physician. - I expect your pain to gradually improve  My best,  Tamsen Snider , MD

## 2019-04-28 NOTE — Progress Notes (Signed)
Internal Medicine Clinic Attending  I saw and evaluated the patient.  I personally confirmed the key portions of the history and exam documented by Dr. Steen and I reviewed pertinent patient test results.  The assessment, diagnosis, and plan were formulated together and I agree with the documentation in the resident's note.     

## 2019-05-13 ENCOUNTER — Telehealth: Payer: Self-pay | Admitting: Internal Medicine

## 2019-05-13 NOTE — Telephone Encounter (Signed)
   Reason for call:   I received a call from [name] at 10:46 PM indicating on going hip pain.Marland Kitchen   Pertinent Data:   She has had hip pain for over 1 month. She was seen on 10/30 and received a steroid injection for suspected greater trochanteric pain syndrome.  She did not get any relief from the injection and has had persistent moderate to severe pain.  She denies fevers, skin changes at injection site, falls, no other new symptoms    Assessment / Plan / Recommendations:   Patient has on going hip pain, not improved after recent injection  She will need to be re-evaluated and is agreeable to [facility] appointment tomorrow.  Will send a message to [facility] staff and have instructed patient to cal tomorrow as well to set a time.   As always, pt is advised that if symptoms worsen or new symptoms arise, they should go to an urgent care facility or to to ER for further evaluation.   Neva Seat, MD   05/13/2019, 10:41 PM

## 2019-05-14 ENCOUNTER — Other Ambulatory Visit: Payer: Self-pay

## 2019-05-14 ENCOUNTER — Ambulatory Visit (INDEPENDENT_AMBULATORY_CARE_PROVIDER_SITE_OTHER): Payer: BLUE CROSS/BLUE SHIELD | Admitting: Internal Medicine

## 2019-05-14 ENCOUNTER — Encounter: Payer: Self-pay | Admitting: Internal Medicine

## 2019-05-14 ENCOUNTER — Ambulatory Visit (HOSPITAL_COMMUNITY)
Admission: RE | Admit: 2019-05-14 | Discharge: 2019-05-14 | Disposition: A | Discharge status: Home / Self Care | Payer: BLUE CROSS/BLUE SHIELD | Source: Ambulatory Visit | Attending: Student in an Organized Health Care Education/Training Program | Admitting: Student in an Organized Health Care Education/Training Program

## 2019-05-14 VITALS — BP 174/108 | HR 81 | Temp 98.3°F | Ht 59.0 in | Wt 129.2 lb

## 2019-05-14 DIAGNOSIS — G8929 Other chronic pain: Secondary | ICD-10-CM | POA: Diagnosis not present

## 2019-05-14 DIAGNOSIS — M25552 Pain in left hip: Secondary | ICD-10-CM | POA: Diagnosis present

## 2019-05-14 DIAGNOSIS — Z720 Tobacco use: Secondary | ICD-10-CM | POA: Diagnosis not present

## 2019-05-14 DIAGNOSIS — Z79891 Long term (current) use of opiate analgesic: Secondary | ICD-10-CM

## 2019-05-14 MED ORDER — TRAMADOL HCL 50 MG PO TABS: 50.0000 mg | ORAL_TABLET | Freq: Two times a day (BID) | ORAL | 0 refills | Status: DC | PRN

## 2019-05-14 MED FILL — traMADol HCL 50 MG TABS: 50 | 14 days supply | Qty: 28 | Fill #0

## 2019-05-14 NOTE — Patient Instructions (Signed)
Ms. Reinking,   For your pain we will get xrays today to see what is causing it. I also sent tramadol 50 mg tablets to your pharmacy that you can take every 12 hours as needed for pain.   I also placed a referral to orthopedic surgery. Give them a week to call you and set up an appointment.   Call us if you have any further questions or concerns.   - Dr. Frederico Hamman

## 2019-05-14 NOTE — Progress Notes (Signed)
   CC: L hip pain   HPI:  [name] is a 58 y.o. year-old female with PMH listed below who presents to clinic for L hip pain. Please see problem based assessment and plan for further details.   Past Medical History:  Diagnosis Date  . Anemia   . Arthritis    "neck, hips" (04/25/2018)  . Claustrophobia   . Daily headache   . GERD (gastroesophageal reflux disease)   . Hiatal hernia   . Hyperlipidemia    "hx" (04/25/2018)  . Hypertension   . Plantar fasciitis   . Stroke [facility]) 05/2014   "mini stroke" (04/25/2018)    Review of Systems:   Review of Systems  Constitutional: Negative for chills and fever.  Musculoskeletal: Positive for joint pain. Negative for falls.    Physical Exam:  Vitals:   05/14/19 1010  BP: (!) 174/108  Pulse: 81  Temp: 98.3 F (36.8 C)  TempSrc: Oral  SpO2: 100%  Weight: 129 lb 3.2 oz (58.6 kg)  Height: 4\' 11"  (1.499 m)    General: pleasant female, in mild distress due to L hip pain MSK: There is focal tenderness above greater trochanter. Passive and active range of motion in any direction elicit pain in lateral hip, no swelling or erythema noted, negative trendelenburg test    Assessment & Plan:   See Encounters Tab for problem based charting.  Patient discussed with Dr. Evette Doffing

## 2019-05-14 NOTE — Assessment & Plan Note (Addendum)
Ms. Adderly presents with a 1 month history of left hip pain that has been gradual in onset and worsening.  She locates it to the lateral side of the hip above the greater trochanter area and describes it as something rubbing together.  Walking and lying on her left side at night makes it worse.  She has not identified anything that makes it better. She received a steroid injection 2 weeks ago but did not experience any relief from it. She has also tried Tylenol and Advil with no relief. She fell on her right hip about 3 months ago but did not injure her left side and denies any recent trauma to the left hip.  She denies radicular and infectious symptoms, recent illness, and history of OA. Has never had imaging of her hips. She works [employer] and the pain is interfering with work and ADLs due to limited ability to ambulate.   Differential includes: refractory greater trochanteric pain syndrome, femoral head OA, and tendon tear. No history of CAD or PAD to suggest aortoiliac insufficiency.  Although she is an everyday smoker.  No risk factors for osteonecrosis.  No history of rheumatologic disease.  - L hip XR  - Tramadol 50 mg BID PRN  - Referral to ortho

## 2019-05-15 ENCOUNTER — Encounter: Payer: Self-pay | Admitting: Internal Medicine

## 2019-05-15 NOTE — Progress Notes (Signed)
Internal Medicine Clinic Attending  Case discussed with Dr. Santos-Sanchez at the time of the visit.  We reviewed the resident's history and exam and pertinent patient test results.  I agree with the assessment, diagnosis, and plan of care documented in the resident's note.    

## 2019-05-26 ENCOUNTER — Other Ambulatory Visit: Payer: Self-pay | Admitting: Internal Medicine

## 2019-05-26 MED FILL — AMLODIPINE 2.5 MG TABLET: 2.5 | 30 days supply | Qty: 30 | Fill #3

## 2019-05-26 MED FILL — OMEPRAZOLE DR 40 MG CAPSULE: 40 | 90 days supply | Qty: 90 | Fill #0

## 2019-05-26 MED FILL — LISINOPRIL 20 MG TABLET: 20 | 30 days supply | Qty: 30 | Fill #3

## 2019-05-26 NOTE — Telephone Encounter (Signed)
No, tried to call her, lm for rtc

## 2019-05-26 NOTE — Telephone Encounter (Signed)
Patient called in stating she only takes Prilosec and is doing well on it. Informed her refill sent today to [place]. She was very Patent attorney. Hubbard Hartshorn, BSN, RN-BC

## 2019-05-26 NOTE — Telephone Encounter (Signed)
We had switched from Protonix to Prilosec due to insurance formulary.  However, apparently she was not having as good of a response to Prilosec.  Do you know if she is still using Prilosec?

## 2019-07-02 ENCOUNTER — Other Ambulatory Visit: Payer: Self-pay | Admitting: Internal Medicine

## 2019-07-02 MED FILL — AMLODIPINE 2.5 MG TABLET: 2.5 | 30 days supply | Qty: 30 | Fill #0

## 2019-07-02 MED FILL — LISINOPRIL 20 MG TABLET: 20 | 30 days supply | Qty: 30 | Fill #0

## 2019-07-02 NOTE — Telephone Encounter (Signed)
pls sch PCP appt next 4 months, does her insurance now cover [facility]?

## 2019-07-24 ENCOUNTER — Ambulatory Visit: Payer: BLUE CROSS/BLUE SHIELD

## 2019-07-29 ENCOUNTER — Telehealth: Payer: Self-pay | Admitting: Internal Medicine

## 2019-07-29 ENCOUNTER — Other Ambulatory Visit: Payer: Self-pay | Admitting: Internal Medicine

## 2019-07-29 DIAGNOSIS — E2839 Other primary ovarian failure: Secondary | ICD-10-CM

## 2019-07-29 MED ORDER — HYDROCODONE-ACETAMINOPHEN 5-325 MG PO TABS: 1.0000 | ORAL_TABLET | Freq: Four times a day (QID) | ORAL | 0 refills | Status: DC | PRN

## 2019-07-29 MED FILL — HYDROCODON-APAP 5-325: 5-325 | 4 days supply | Qty: 15 | Fill #0

## 2019-07-29 NOTE — Progress Notes (Signed)
I called patient to address her question about getting the Covid vaccine  1.  She wanted the telephone number to Dr. Ronnie Derby, the orthopedic referral, as she now has insurance and wants to schedule the appointment.  I gave her the telephone number and address and encouraged her to make certain he was in [facility] before making the appointment 2.  She wanted a refill on the hydrocodone for her hip pain until she can see the orthopedist.  The tramadol she was prescribed in November did not help.  She uses 1/2 pill of hydrocodone as needed and uses Tylenol when she does not require hydrocodone.  She got 15 pills in September and is now requesting a refill in February.  I confirmed her pharmacy. 3.  Her job is offering free Covid vaccines on the 17th but will go through her insurance.  I stated she had no medication or past medical history that would contraindicate getting a Covid vaccination 4.  She requested a mammogram and I will order that 5.  She requests an appointment with me and I will send her to the front desk to schedule

## 2019-07-29 NOTE — Telephone Encounter (Signed)
Pt is wanting to know is it safe for her to get the vaccine; pt contact [phone]

## 2019-07-30 ENCOUNTER — Ambulatory Visit: Payer: Self-pay

## 2019-07-30 MED FILL — AMLODIPINE 2.5 MG TABLET: 2.5 | 30 days supply | Qty: 30 | Fill #1

## 2019-07-30 MED FILL — LISINOPRIL 20 MG TABLET: 20 | 30 days supply | Qty: 30 | Fill #1

## 2019-07-30 NOTE — Progress Notes (Signed)
This patient is sch to see you on 08/07/2019 @ 9:15 am.

## 2019-08-04 ENCOUNTER — Emergency Department (HOSPITAL_COMMUNITY): Payer: 59

## 2019-08-04 ENCOUNTER — Encounter (HOSPITAL_COMMUNITY): Payer: Self-pay | Admitting: *Deleted

## 2019-08-04 ENCOUNTER — Telehealth: Payer: Self-pay | Admitting: Internal Medicine

## 2019-08-04 ENCOUNTER — Emergency Department (HOSPITAL_COMMUNITY)
Admission: EM | Admit: 2019-08-04 | Discharge: 2019-08-04 | Disposition: A | Discharge status: Home / Self Care | Payer: 59 | Attending: Emergency Medicine | Admitting: Emergency Medicine

## 2019-08-04 ENCOUNTER — Other Ambulatory Visit: Payer: Self-pay

## 2019-08-04 DIAGNOSIS — M25511 Pain in right shoulder: Secondary | ICD-10-CM | POA: Diagnosis present

## 2019-08-04 DIAGNOSIS — I10 Essential (primary) hypertension: Secondary | ICD-10-CM | POA: Insufficient documentation

## 2019-08-04 DIAGNOSIS — E876 Hypokalemia: Secondary | ICD-10-CM | POA: Insufficient documentation

## 2019-08-04 DIAGNOSIS — F17210 Nicotine dependence, cigarettes, uncomplicated: Secondary | ICD-10-CM | POA: Diagnosis not present

## 2019-08-04 DIAGNOSIS — Z79899 Other long term (current) drug therapy: Secondary | ICD-10-CM | POA: Diagnosis not present

## 2019-08-04 LAB — CBC WITH DIFFERENTIAL/PLATELET
Abs Immature Granulocytes: 0.01 10*3/uL (ref 0.00–0.07)
Basophils Absolute: 0 10*3/uL (ref 0.0–0.1)
Basophils Relative: 0 %
Eosinophils Absolute: 0.1 10*3/uL (ref 0.0–0.5)
Eosinophils Relative: 2 %
HCT: 35.1 % — ABNORMAL LOW (ref 36.0–46.0)
Hemoglobin: 10.6 g/dL — ABNORMAL LOW (ref 12.0–15.0)
Immature Granulocytes: 0 %
Lymphocytes Relative: 36 %
Lymphs Abs: 1.9 10*3/uL (ref 0.7–4.0)
MCH: 22.6 pg — ABNORMAL LOW (ref 26.0–34.0)
MCHC: 30.2 g/dL (ref 30.0–36.0)
MCV: 74.7 fL — ABNORMAL LOW (ref 80.0–100.0)
Monocytes Absolute: 0.4 10*3/uL (ref 0.1–1.0)
Monocytes Relative: 8 %
Neutro Abs: 2.8 10*3/uL (ref 1.7–7.7)
Neutrophils Relative %: 54 %
Platelets: 287 10*3/uL (ref 150–400)
RBC: 4.7 MIL/uL (ref 3.87–5.11)
RDW: 14.7 % (ref 11.5–15.5)
WBC: 5.2 10*3/uL (ref 4.0–10.5)
nRBC: 0 % (ref 0.0–0.2)

## 2019-08-04 LAB — BASIC METABOLIC PANEL
Anion gap: 12 (ref 5–15)
BUN: 10 mg/dL (ref 6–20)
CO2: 25 mmol/L (ref 22–32)
Calcium: 8.5 mg/dL — ABNORMAL LOW (ref 8.9–10.3)
Chloride: 104 mmol/L (ref 98–111)
Creatinine, Ser: 0.54 mg/dL (ref 0.44–1.00)
GFR calc Af Amer: >60 mL/min (ref >60)
GFR calc non Af Amer: >60 mL/min (ref >60)
Glucose, Bld: 92 mg/dL (ref 70–99)
Potassium: 3 mmol/L — ABNORMAL LOW (ref 3.5–5.1)
Sodium: 141 mmol/L (ref 135–145)

## 2019-08-04 MED ORDER — PREDNISONE 10 MG PO TABS: 20.0000 mg | ORAL_TABLET | Freq: Every day | ORAL | 0 refills | Status: DC

## 2019-08-04 MED ORDER — POTASSIUM CHLORIDE CRYS ER 20 MEQ PO TBCR
40.0000 meq | EXTENDED_RELEASE_TABLET | Freq: Once | ORAL | Status: AC
  Administered 2019-08-04: 40 meq via ORAL
  Filled 2019-08-04: qty 2

## 2019-08-04 MED ORDER — ACETAMINOPHEN 500 MG PO TABS
1000.0000 mg | ORAL_TABLET | Freq: Once | ORAL | Status: AC
  Administered 2019-08-04: 08:00:00 1000 mg via ORAL
  Filled 2019-08-04: qty 2

## 2019-08-04 MED ORDER — MORPHINE SULFATE (PF) 4 MG/ML IV SOLN
4.0000 mg | Freq: Once | INTRAVENOUS | Status: AC
  Administered 2019-08-04: 4 mg via INTRAMUSCULAR
  Filled 2019-08-04: qty 1

## 2019-08-04 MED FILL — predniSONE 10 MG TABS: 10 | 5 days supply | Qty: 10 | Fill #0

## 2019-08-04 NOTE — ED Notes (Signed)
Patient verbalizes understanding of discharge instructions. Opportunity for questioning and answers were provided. Armband removed by staff, pt discharged from ED.  

## 2019-08-04 NOTE — Discharge Instructions (Signed)
Please return for any problem.  Follow-up with your regular care provider as instructed. °

## 2019-08-04 NOTE — ED Triage Notes (Signed)
C/o pian in shoulder on sat states the pain radiates down her arms and tingling in hands, c/o sob.

## 2019-08-04 NOTE — ED Provider Notes (Signed)
[facility] EMERGENCY DEPARTMENT Provider Note   CSN: [id number] Arrival date & time: 08/04/19  [id number]     History Chief Complaint  Patient presents with  . Shoulder Pain    [name] is a 59 y.o. female.  59 year old female with prior medical history as detailed below presents for evaluation of shoulder pain.  Patient reports onset of left shoulder pain yesterday.  This is described as a dull achy pain worse with movement.  This morning the pain moved to the right shoulder and right shoulder blade area.  She reports increased pain with movement.  She denies associated anterior chest pain, shortness of breath, nausea, vomiting, fever, diaphoresis, or other specific complaint.  The history is provided by the patient and medical records.  Shoulder Pain Location:  Shoulder Shoulder location:  L shoulder and R shoulder Pain details:    Quality:  Aching   Severity:  Mild   Onset quality:  Gradual   Duration:  1 day   Timing:  Sporadic   Progression:  Waxing and waning Relieved by:  Nothing Worsened by:  Nothing      Past Medical History:  Diagnosis Date  . Anemia   . Arthritis    "neck, hips" (04/25/2018)  . Claustrophobia   . Daily headache   . GERD (gastroesophageal reflux disease)   . Hiatal hernia   . Hyperlipidemia    "hx" (04/25/2018)  . Hypertension   . Plantar fasciitis   . Stroke [facility]) 05/2014   "mini stroke" (04/25/2018)    Patient Active Problem List   Diagnosis Date Noted  . Hip pain, left 05/14/2019  . Pain, dental 02/17/2019  . External hemorrhoids 09/14/2017  . 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 H/O TIA (transient ischemic attack) and stroke 06/12/2014  . Cervical radiculitis 02/12/2014  . Healthcare maintenance 04/30/2013  . Essential hypertension, benign 01/24/2010  . Anemia 01/11/2010  . Hyperlipemia 05/28/2007  . TOBACCO ABUSE 05/28/2007  . GERD 05/28/2007    Past Surgical History:  Procedure  Laterality Date  . ABDOMINAL HYSTERECTOMY  12/09   Secondary to fibroids  . ARTERY BIOPSY Right 04/10/2018   Procedure: BIOPSY TEMPORAL ARTERY;  Surgeon: Rosetta Posner, MD;  Location: [place];  Service: Vascular;  Laterality: Right;  . [place]; 1988  . FOOT SURGERY Left 03/2017   Bone Spurs Removed  . RADIOLOGY WITH ANESTHESIA N/A 04/26/2018   Procedure: MRI WITH ANESTHESIA;  Surgeon: Radiologist, Medication, MD;  Location: [place];  Service: Radiology;  Laterality: N/A;  . TUBAL LIGATION  1988     OB History   No obstetric history on file.     Family History  Problem Relation Age of Onset  . Cancer Mother 42       pancreatic cancer  . Pancreatic cancer Mother   . Heart disease Mother   . Cancer Father 67       throat cancer  . Cancer Maternal Aunt 60       breast cancer  . Cancer Other        Died  from Colon cancer in 65's  . Colon cancer Maternal Uncle   . Colon cancer Paternal Uncle   . Heart disease Sister     Social History   Tobacco Use  . Smoking status: Current Some Day Smoker    Packs/day: 0.00    Years: 38.00    Pack years: 0.00    Types: Cigarettes  . Smokeless  tobacco: Never Used  . Tobacco comment: 1.5  cigarettes daily  Substance Use Topics  . Alcohol use: Yes    Comment: 2 beers on weekend  . Drug use: Not Currently    Home Medications Prior to Admission medications   Medication Sig Start Date End Date Taking? Authorizing Provider  acetaminophen (TYLENOL) 500 MG tablet Take 1,000 mg by mouth every 6 (six) hours as needed for moderate pain or headache.    [provider]  AMBULATORY NON FORMULARY MEDICATION Medication Name: nitroglycerin 0.125 mg ointment mixed with 2% lidocaine three times a day for 6-8 weeks 05/09/18   Bartholomew Crews, MD  amLODipine (NORVASC) 2.5 MG tablet TAKE 1 TABLET BY MOUTH DAILY. 07/02/19   Bartholomew Crews, MD  amoxicillin (AMOXIL) 500 MG capsule Take 1 capsule (500 mg total) by mouth 3  (three) times daily. 02/19/19   Bartholomew Crews, MD  carbamazepine (CARBATROL) 200 MG 12 hr capsule Take 1 capsule (200 mg total) by mouth 2 (two) times daily. 05/09/18   Bartholomew Crews, MD  ferrous sulfate (IRON SUPPLEMENT) 325 (65 FE) MG tablet Take 1 tablet (325 mg total) by mouth daily with breakfast. 03/01/16   Bartholomew Crews, MD  gabapentin (NEURONTIN) 100 MG capsule Take 1 capsule (100 mg total) by mouth 3 (three) times daily. 03/20/19   Bartholomew Crews, MD  HYDROcodone-acetaminophen (NORCO/VICODIN) 5-325 MG tablet Take 1 tablet by mouth every 6 (six) hours as needed. 07/29/19   Bartholomew Crews, MD  hydrocortisone (ANUSOL-HC) 2.5 % rectal cream Place 1 application rectally 2 (two) times daily. 05/09/18   Bartholomew Crews, MD  lisinopril (ZESTRIL) 20 MG tablet TAKE 1 TABLET BY MOUTH DAILY. 07/02/19   Bartholomew Crews, MD  omeprazole (PRILOSEC) 40 MG capsule TAKE 1 CAPSULE BY MOUTH DAILY. 05/26/19   Bartholomew Crews, MD  phenylephrine-shark liver oil-mineral oil-petrolatum (PREPARATION H) 0.25-3-14-71.9 % rectal ointment Place 1 application rectally 2 (two) times daily as needed for hemorrhoids. 11/25/18   Bloomfield, Carley D, DO  polycarbophil (FIBERCON) 625 MG tablet Take 1 tablet (625 mg total) by mouth daily. 11/25/18 11/25/19  Bloomfield, Carley D, DO  pramoxine (PROCTOFOAM) 1 % foam Place 1 application rectally 3 (three) times daily as needed for anal itching. 11/25/18   Bloomfield, Carley D, DO  traMADol (ULTRAM) 50 MG tablet Take 1 tablet (50 mg total) by mouth every 12 (twelve) hours as needed. 05/14/19 05/13/20  Welford Roche, MD    Allergies    Aspirin and Hydromorphone hcl  Review of Systems   Review of Systems  All other systems reviewed and are negative.   Physical Exam Updated Vital Signs BP (!) 147/102   Pulse 97   Temp 98.1 F (36.7 C) (Oral)   Resp 17   Ht 4\' 11"  (1.499 m)   Wt 59 kg   SpO2 100%   BMI 26.26 kg/m   Physical  Exam Vitals and nursing note reviewed.  Constitutional:      General: She is not in acute distress.    Appearance: She is well-developed.  HENT:     Head: Normocephalic and atraumatic.  Eyes:     Conjunctiva/sclera: Conjunctivae normal.     Pupils: Pupils are equal, round, and reactive to light.  Cardiovascular:     Rate and Rhythm: Normal rate and regular rhythm.     Heart sounds: Normal heart sounds.  Pulmonary:     Effort: Pulmonary effort is normal. No respiratory distress.  Breath sounds: Normal breath sounds.  Abdominal:     General: There is no distension.     Palpations: Abdomen is soft.     Tenderness: There is no abdominal tenderness.  Musculoskeletal:        General: No deformity. Normal range of motion.     Cervical back: Normal range of motion and neck supple.     Comments: No discrete tenderness noted to bilateral shoulders.  Patient is localizing her discomfort to the posterior aspect of the right shoulder with mild radiation down the right arm to the elbow.  Skin:    General: Skin is warm and dry.  Neurological:     Mental Status: She is alert and oriented to person, place, and time.     ED Results / Procedures / Treatments   Labs (all labs ordered are listed, but only abnormal results are displayed) Labs Reviewed  BASIC METABOLIC PANEL - Abnormal; Notable for the following components:      Result Value   Potassium 3.0 (*)    Calcium 8.5 (*)    All other components within normal limits  CBC WITH DIFFERENTIAL/PLATELET - Abnormal; Notable for the following components:   Hemoglobin 10.6 (*)    HCT 35.1 (*)    MCV 74.7 (*)    MCH 22.6 (*)    All other components within normal limits    EKG EKG Interpretation  Date/Time:  Monday August 04 2019 06:48:09 EST Ventricular Rate:  89 PR Interval:  152 QRS Duration: 84 QT Interval:  390 QTC Calculation: 474 R Axis:   91 Text Interpretation: Normal sinus rhythm Rightward axis Borderline ECG Confirmed  by Dene Gentry [phone]) on 08/04/2019 7:12:09 AM   Radiology No results found.  Procedures Procedures (including critical care time)  Medications Ordered in ED Medications - No data to display  ED Course  I have reviewed the triage vital signs and the nursing notes.  Pertinent labs & imaging results that were available during my care of the patient were reviewed by me and considered in my medical decision making (see chart for details).    MDM Rules/Calculators/A&P                      MDM  Screen complete  CAILANI ADAMIK was evaluated in Emergency Department on 08/04/2019 for the symptoms described in the history of present illness. She was evaluated in the context of the global COVID-19 pandemic, which necessitated consideration that the patient might be at risk for infection with the SARS-CoV-2 virus that causes COVID-19. Institutional protocols and algorithms that pertain to the evaluation of patients at risk for COVID-19 are in a state of rapid change based on information released by regulatory bodies including the CDC and federal and state organizations. These policies and algorithms were followed during the patient's care in the ED.  Patient is presenting with complaints of pain to the shoulders right greater than left.  Patient's presentation is most consistent with possible radiculopathy versus musculoskeletal strain.  Work-up in the ED did not reveal evidence of significant acute pathology.  Patient's hypokalemia was treated.  Patient has a longstanding history of same.  She does understand need for close follow-up with repeat potassium testing within the next 2 to 3 weeks.  Patient feels improved following ED evaluation.  Patient be discharged with a short course of prednisone.  She reports that she has taken prednisone in the past for similar symptoms (diagnosed as radiculopathy) with improvement.  Strict return precautions given and understood.  Importance of close  follow-up is stressed.    Final Clinical Impression(s) / ED Diagnoses Final diagnoses:  Acute pain of right shoulder  Hypokalemia    Rx / DC Orders ED Discharge Orders         Ordered    predniSONE (DELTASONE) 10 MG tablet  Daily     08/04/19 1005           Valarie Merino, MD 08/04/19 1007

## 2019-08-04 NOTE — Telephone Encounter (Signed)
Assessment: Pt calls after hours line at 6am on 08/04/19.  She reports worsening pain and shortness of breath. This started Saturday evening the pain is in her shoulder blades tingling down her right arm and into her finger tips.  Does not seem to be exertional.  She has been told she has nerve impingement at C6-C7 but she is concerned because this feels different.  She denies any chest pain or pressure but does endorse new shortness of breath even at rest she reports difficulty keeping herself composed while talking to me on the phone. She denies cough, wheezing, fevers.  She is scheduled to get the covid vaccine on the 17th for her first dose.  Works at [employer], gets tested for covid twice weekly without any positive tests she wears appropriate PPE as she works on the [employer] No cardiac history but says she had a TIA "years ago".    I do not have a good explanation for the new onset shortness of breath, her pain is not typical for ACS and could be msk pain however women can often present with atypica features.    Plan:  -Patient is currently on her way to the ED for further evaluation, she has a friend driving her.

## 2019-08-05 ENCOUNTER — Other Ambulatory Visit: Payer: Self-pay

## 2019-08-05 ENCOUNTER — Ambulatory Visit (INDEPENDENT_AMBULATORY_CARE_PROVIDER_SITE_OTHER): Payer: 59 | Admitting: Radiation Oncology

## 2019-08-05 VITALS — BP 113/78 | HR 94 | Temp 99.0°F | Ht 59.0 in | Wt 127.9 lb

## 2019-08-05 DIAGNOSIS — Z79899 Other long term (current) drug therapy: Secondary | ICD-10-CM | POA: Diagnosis not present

## 2019-08-05 DIAGNOSIS — M5412 Radiculopathy, cervical region: Secondary | ICD-10-CM | POA: Diagnosis not present

## 2019-08-05 MED ORDER — CYCLOBENZAPRINE HCL 5 MG PO TABS: 5.0000 mg | ORAL_TABLET | Freq: Three times a day (TID) | ORAL | 0 refills | Status: DC | PRN

## 2019-08-05 MED FILL — CYCLOBENZAPRINE HCL 5 MG TA: 5 | 3 days supply | Qty: 10 | Fill #0

## 2019-08-05 NOTE — Patient Instructions (Addendum)
Thank you for coming to your appointment. It was so nice to see you. Today we discussed  [name] was seen today for neck pain.  Diagnoses and all orders for this visit:  Cervical radiculitis -     cyclobenzaprine (FLEXERIL) 5 MG tablet; Take 1 tablet (5 mg total) by mouth 3 (three) times daily as needed for muscle spasms. -     ibuprofen 400 mg every 4 hours until you follow up with Dr. Lynnae January  Sincerely,  Al Decant, MD  Stretches TRAPEZIUS STRETCH This exercise targets the muscles behind your neck to relieve pain stemming from a compressed nerve:  Slide your right hand under your right thigh Take your left hand and use it to tilt your head to the left Hold this position for 30 seconds before releasing POSTERIOR STRETCH Involving a folded towel or cloth, this exercise targets the neck area and helps to improve posture:  Grab the towel or cloth with both hands and hold it behind your head Using the towel for resistance, move your head back into it Every time you nod back, hold the position for three seconds before releasing CHIN TUCK This neck exercise helps lengthen the muscles and improve posture:  Place two fingers on your chin Push downward on your chin, moving it toward your neck Hold for three seconds, before releasing your chin HEAD TURNS If your neck has low range of motion, try this exercise at a slow pace that won't cause you pain:  Straighten your head and neck, so that you're facing ahead Turn your head to the right, as far as you can go Hold this position for five to 10 seconds Do the same to the left side SHOULDER ROLLS AND SHRUGS Try the following exercises to relieve tension and pain in the shoulders and neck area:  Lift your shoulder blades upward Roll them back down to the starting position After five or six times, try it in reverse For shrugs, start by standing then:  Keep both arms relatively straight at your sides Move your shoulders in a  rotating motion Rotate in the opposite direction to return to the first position

## 2019-08-06 ENCOUNTER — Ambulatory Visit: Payer: 59

## 2019-08-06 NOTE — Assessment & Plan Note (Addendum)
This problem is episodic and has reoccurred. She reports shooting pain from the right neck down into her right hand with associated tingling. No weakness. She reports the pain is so significant that she has trouble working and that her supervisor has had to help her do her job. She reports a hx of similar pain. Of note, gabapentin has not worked for her pain in the past.  On exam no rashes or deformity. Pain with abduction.   Plan:  -limited course of flexeril -heating pad -scheduled ibuprofen -cervical stretches -she has fu scheduled with PCP on 02/11 -out of work 02/10

## 2019-08-06 NOTE — Progress Notes (Signed)
   CC: right arm pain  HPI:  [name] is a 59 y.o. female who presents to the Internal Medicine Clinic for right arm pain. Please see Assessment and Plan for full HPI.  Past Medical History:  Diagnosis Date  . Anemia   . Arthritis    "neck, hips" (04/25/2018)  . Claustrophobia   . Daily headache   . GERD (gastroesophageal reflux disease)   . Hiatal hernia   . Hyperlipidemia    "hx" (04/25/2018)  . Hypertension   . Plantar fasciitis   . Stroke [facility]) 05/2014   "mini stroke" (04/25/2018)   Review of Systems:  Please see Assessment and Plan for full ROS.  Physical Exam:  Vitals:   08/05/19 1531  BP: 113/78  Pulse: 94  Temp: 99 F (37.2 C)  TempSrc: Oral  SpO2: 100%  Weight: 127 lb 14.4 oz (58 kg)  Height: 4\' 11"  (1.499 m)   Physical Exam  Constitutional: She is oriented to person, place, and time and well-developed, well-nourished, and in no distress. No distress.  HENT:  Head: Normocephalic and atraumatic.  Pulmonary/Chest: Effort normal. No respiratory distress.  Musculoskeletal:        General: No deformity or edema.     Cervical back: Normal range of motion.     Comments: Right UE without deformity or weakness; pain with passive abduction of shoulder  Neurological: She is alert and oriented to person, place, and time.  Skin: Skin is warm and dry. No rash noted. She is not diaphoretic.  Psychiatric: Affect normal.   Assessment & Plan:   See Encounters Tab for problem based charting.  Patient discussed with Dr. Philipp Ovens

## 2019-08-07 ENCOUNTER — Encounter: Payer: Self-pay | Admitting: Internal Medicine

## 2019-08-07 ENCOUNTER — Encounter: Payer: BLUE CROSS/BLUE SHIELD | Admitting: Internal Medicine

## 2019-08-07 NOTE — Progress Notes (Signed)
Internal Medicine Clinic Attending  Case discussed with Dr. Lanierat the time of the visit.  We reviewed the resident's history and exam and pertinent patient test results.  I agree with the assessment, diagnosis, and plan of care documented in the resident's note.   

## 2019-08-21 ENCOUNTER — Encounter: Payer: Self-pay | Admitting: Internal Medicine

## 2019-08-21 ENCOUNTER — Ambulatory Visit (INDEPENDENT_AMBULATORY_CARE_PROVIDER_SITE_OTHER): Payer: 59 | Admitting: Internal Medicine

## 2019-08-21 VITALS — BP 144/86 | HR 90 | Temp 99.0°F | Wt 123.9 lb

## 2019-08-21 DIAGNOSIS — I10 Essential (primary) hypertension: Secondary | ICD-10-CM

## 2019-08-21 DIAGNOSIS — R3589 Other polyuria: Secondary | ICD-10-CM

## 2019-08-21 DIAGNOSIS — R358 Other polyuria: Secondary | ICD-10-CM | POA: Diagnosis not present

## 2019-08-21 DIAGNOSIS — Z0000 Encounter for general adult medical examination without abnormal findings: Secondary | ICD-10-CM

## 2019-08-21 DIAGNOSIS — M5412 Radiculopathy, cervical region: Secondary | ICD-10-CM

## 2019-08-21 DIAGNOSIS — D649 Anemia, unspecified: Secondary | ICD-10-CM | POA: Diagnosis not present

## 2019-08-21 DIAGNOSIS — R6889 Other general symptoms and signs: Secondary | ICD-10-CM

## 2019-08-21 DIAGNOSIS — F4321 Adjustment disorder with depressed mood: Secondary | ICD-10-CM

## 2019-08-21 LAB — GLUCOSE, CAPILLARY: Glucose-Capillary: 84 mg/dL (ref 70–99)

## 2019-08-21 LAB — POCT GLYCOSYLATED HEMOGLOBIN (HGB A1C): Hemoglobin A1C: 5.2 % (ref 4.0–5.6)

## 2019-08-21 NOTE — Assessment & Plan Note (Addendum)
This problem started about 2 weeks ago and she has already seen a colleague regarding her symptoms.  It started with pain between the bilateral shoulders.  She went to bed and when she woke up, the symptoms were only in her right arm.  She has tingling into her hand and her fingers, all fingers of the right hand.  She has discomfort like a dull ache in the right upper arm.  She cannot lie on her right side at night to sleep because it hurts.  This is interfering with her ability to do her job as she has had to call out 3 times and is unable to complete many of her routine job duties as she is right-handed.  She has tried Flexeril which makes her coo-coo in the morning.  She uses hydrocodone only when it is incredibly severe.  Otherwise, she is using acetaminophen or Aleve to control the symptoms.   MRI is medically necessary at this time as she is on acetaminophen, a nonsteroidal, Flexeril which is causing side effects, completed a steroid course, and is using hydrocodone as needed but her symptoms have not improved and are interfering with her ability to complete her job duties.  We discussed treatment.  She has had similar pain in the left arm and also left hip both of which improved with just time.  I discussed that time might help to relieve the pain and that we could control the symptoms using pharmaceuticals.  We also discussed getting an MRI and possible steroid injection which might hasten symptom relief.  She chose to proceed with the MRI.  We then had a lengthy discussion as she has significant claustrophobia that has worsened since her last cervical MRI in 2013.  She was able to get a cervical open MRI as an outpatient with 2 doses of Valium.  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  We elected to do a direct admit tomorrow to do deep sedation to get the cervical MRI.  She elected not  to escalate medications today to gabapentin, pregabalin, and/or duloxetine as she wants to get the MRI results first.  She is also willing to consider a steroid injection if the MRI is compatible with that procedure.  PLAN : Direct admit February 26 for deep sedation and a cervical MRI

## 2019-08-21 NOTE — Patient Instructions (Signed)
1. I will work on a direct admission tomorrow 2. [name] will call you 3. Telehealth appt Lynnae January 1 week

## 2019-08-21 NOTE — Progress Notes (Signed)
   Subjective:    Patient ID: [name], female    DOB: December 14, 1960, 59 y.o.   MRN: [id number]  HPI  ALAIA Bauer is here for R arm timgling. Please see the A&P for the status of the pt's chronic medical problems.  ROS : per ROS section and in problem oriented charting. All other systems are negative.  PMHx, Soc hx, and / or Fam hx : Still working in housekeeping   Review of Systems  Constitutional: Positive for unexpected weight change.  Neurological: Positive for numbness. Negative for weakness.  Psychiatric/Behavioral: Positive for dysphoric mood and sleep disturbance.       Objective:   Physical Exam Constitutional:      Appearance: Normal appearance.  HENT:     Head: Normocephalic and atraumatic.     Right Ear: External ear normal.     Left Ear: External ear normal.  Skin:    General: Skin is warm and dry.  Neurological:     General: No focal deficit present.     Mental Status: She is alert. Mental status is at baseline.  Psychiatric:        Mood and Affect: Mood normal.        Behavior: Behavior normal.        Thought Content: Thought content normal.        Judgment: Judgment normal.    Strength R & L grip, finger, wrist, elbow 5/5 Sensation ln R fingertips Skin no rash, abnl     Assessment & Plan:  Billing notes We had an extensive discussion regarding need for hospitalization to complete the MRI.  Her right radicular symptoms are an acute illness that if untreated could pose a threat to bodily function as it could result in permanent weakness and muscular atrophy

## 2019-08-21 NOTE — Assessment & Plan Note (Signed)
She has a new problem today.  She has unintended weight loss, 17 pounds in the past year.  She feels this is all due to depression as she has had numerous deaths in her extended family and she feels depressed.  She is up-to-date on screening with the exception of mammogram.  She does endorse sweating at night but only between her breast.  She has no other red flag symptoms.  She is open to any intervention so we are going to start with a [facility] counseling.  We also discussed that some of the medicines I might use for neuropathic pain at night also help depression.  PLAN Referral to [facility]

## 2019-08-22 ENCOUNTER — Other Ambulatory Visit: Payer: Self-pay

## 2019-08-22 ENCOUNTER — Telehealth: Payer: Self-pay | Admitting: Internal Medicine

## 2019-08-22 ENCOUNTER — Observation Stay (HOSPITAL_COMMUNITY)
Admission: AD | Admit: 2019-08-22 | Discharge: 2019-08-24 | Discharge status: Against Medical Advice | Payer: 59 | Source: Ambulatory Visit | Attending: Internal Medicine | Admitting: Internal Medicine

## 2019-08-22 DIAGNOSIS — Z79899 Other long term (current) drug therapy: Secondary | ICD-10-CM | POA: Insufficient documentation

## 2019-08-22 DIAGNOSIS — R7989 Other specified abnormal findings of blood chemistry: Secondary | ICD-10-CM | POA: Diagnosis not present

## 2019-08-22 DIAGNOSIS — K219 Gastro-esophageal reflux disease without esophagitis: Secondary | ICD-10-CM | POA: Diagnosis not present

## 2019-08-22 DIAGNOSIS — Z886 Allergy status to analgesic agent status: Secondary | ICD-10-CM | POA: Insufficient documentation

## 2019-08-22 DIAGNOSIS — Z885 Allergy status to narcotic agent status: Secondary | ICD-10-CM | POA: Insufficient documentation

## 2019-08-22 DIAGNOSIS — I10 Essential (primary) hypertension: Secondary | ICD-10-CM | POA: Diagnosis not present

## 2019-08-22 DIAGNOSIS — Z20822 Contact with and (suspected) exposure to covid-19: Secondary | ICD-10-CM | POA: Insufficient documentation

## 2019-08-22 DIAGNOSIS — R200 Anesthesia of skin: Secondary | ICD-10-CM | POA: Diagnosis present

## 2019-08-22 DIAGNOSIS — Z5329 Procedure and treatment not carried out because of patient's decision for other reasons: Secondary | ICD-10-CM

## 2019-08-22 DIAGNOSIS — F17210 Nicotine dependence, cigarettes, uncomplicated: Secondary | ICD-10-CM | POA: Diagnosis not present

## 2019-08-22 DIAGNOSIS — Z79891 Long term (current) use of opiate analgesic: Secondary | ICD-10-CM

## 2019-08-22 DIAGNOSIS — Z8673 Personal history of transient ischemic attack (TIA), and cerebral infarction without residual deficits: Secondary | ICD-10-CM | POA: Diagnosis not present

## 2019-08-22 DIAGNOSIS — M5412 Radiculopathy, cervical region: Principal | ICD-10-CM | POA: Diagnosis present

## 2019-08-22 DIAGNOSIS — E876 Hypokalemia: Secondary | ICD-10-CM | POA: Diagnosis present

## 2019-08-22 DIAGNOSIS — Z720 Tobacco use: Secondary | ICD-10-CM

## 2019-08-22 DIAGNOSIS — Z8261 Family history of arthritis: Secondary | ICD-10-CM

## 2019-08-22 DIAGNOSIS — D509 Iron deficiency anemia, unspecified: Secondary | ICD-10-CM | POA: Insufficient documentation

## 2019-08-22 LAB — BMP8+ANION GAP
Anion Gap: 16 mmol/L (ref 10.0–18.0)
BUN/Creatinine Ratio: 16 (ref 9–23)
BUN: 9 mg/dL (ref 6–24)
CO2: 23 mmol/L (ref 20–29)
Calcium: 9.1 mg/dL (ref 8.7–10.2)
Chloride: 106 mmol/L (ref 96–106)
Creatinine, Ser: 0.58 mg/dL (ref 0.57–1.00)
GFR calc Af Amer: 118 mL/min/{1.73_m2} (ref >59)
GFR calc non Af Amer: 103 mL/min/{1.73_m2} (ref >59)
Glucose: 78 mg/dL (ref 65–99)
Potassium: 3.5 mmol/L (ref 3.5–5.2)
Sodium: 145 mmol/L — ABNORMAL HIGH (ref 134–144)

## 2019-08-22 LAB — VITAMIN B12: Vitamin B-12: 426 pg/mL (ref 232–1245)

## 2019-08-22 LAB — FERRITIN: Ferritin: 224 ng/mL — ABNORMAL HIGH (ref 15–150)

## 2019-08-22 MED ORDER — PANTOPRAZOLE SODIUM 40 MG PO TBEC
80.0000 mg | DELAYED_RELEASE_TABLET | Freq: Every day | ORAL | Status: DC
  Administered 2019-08-23: 80 mg via ORAL
  Filled 2019-08-22: qty 2

## 2019-08-22 MED ORDER — LISINOPRIL 20 MG PO TABS: 20.0000 mg | ORAL_TABLET | Freq: Every day | ORAL | Status: DC

## 2019-08-22 MED ORDER — AMLODIPINE BESYLATE 2.5 MG PO TABS
2.5000 mg | ORAL_TABLET | Freq: Every day | ORAL | Status: DC
  Administered 2019-08-23: 2.5 mg via ORAL
  Filled 2019-08-22: qty 1

## 2019-08-22 MED ORDER — HYDROCODONE-ACETAMINOPHEN 5-325 MG PO TABS
0.5000 | ORAL_TABLET | Freq: Four times a day (QID) | ORAL | Status: DC | PRN
  Administered 2019-08-22 – 2019-08-23 (×4): 1 via ORAL
  Filled 2019-08-22 (×4): qty 1

## 2019-08-22 MED ORDER — FERROUS SULFATE 325 (65 FE) MG PO TABS: 325.0000 mg | ORAL_TABLET | Freq: Every day | ORAL | Status: DC

## 2019-08-22 MED ORDER — CYCLOBENZAPRINE HCL 10 MG PO TABS
5.0000 mg | ORAL_TABLET | Freq: Three times a day (TID) | ORAL | Status: DC | PRN
  Administered 2019-08-23: 5 mg via ORAL
  Filled 2019-08-22: qty 1

## 2019-08-22 MED ORDER — AMLODIPINE BESYLATE 2.5 MG PO TABS: 2.5000 mg | ORAL_TABLET | Freq: Every day | ORAL | Status: DC

## 2019-08-22 MED ORDER — PROMETHAZINE HCL 25 MG PO TABS: 12.5000 mg | ORAL_TABLET | Freq: Four times a day (QID) | ORAL | Status: DC | PRN

## 2019-08-22 MED ORDER — SENNOSIDES-DOCUSATE SODIUM 8.6-50 MG PO TABS: 1.0000 | ORAL_TABLET | Freq: Every evening | ORAL | Status: DC | PRN

## 2019-08-22 MED ORDER — ENOXAPARIN SODIUM 40 MG/0.4ML ~~LOC~~ SOLN
40.0000 mg | SUBCUTANEOUS | Status: DC
  Filled 2019-08-22: qty 0.4

## 2019-08-22 NOTE — H&P (Addendum)
Date: 08/22/2019               Patient Name:  [name] MRN: [id number]  DOB: Oct 18, 1960 Age / Sex: 59 y.o., female   PCP: Bartholomew Crews, MD         Medical Service: Internal Medicine Teaching Service         Attending Physician: Dr. Aldine Contes, MD    First Contact: Dr. Benjamine Mola Pager: [phone]  Second Contact: Dr. Maricela Bo Pager: [phone]       After Hours (After 5p/  First Contact Pager: [phone]  weekends / holidays): Second Contact Pager: [phone]   Chief Complaint: Right arm pain  History of Present Illness: Patient is a 59 year old female with past medical history significant for hypertension, GERD, anemia who presents with right shoulder pain for an MRI with sedation.  The pain started 2 weeks ago, described as episodic shooting pain starting in her neck and going down her right arm into all the fingers of her right hand.  Patient denies exacerbation with changes in position, states Flexeril helps a little bit with the pain.  Patient reports the pain is worse at night and there is a constant tingling sensation in her hand.  Patient reports that a similar event occurred on her left side in 2015 and improved with a joint injection.  Meds:  *Amlodipine 2.5 mg daily *Lisinopril 20 mg daily *Omeprazole 40 mg daily *Flexeril 5 mg 3 times daily as needed *Iron 325 mg daily *Hydrocodone-acetaminophen 5-325 milligrams, 0.5 tablets as needed for pain  Allergies: Allergies as of 08/22/2019 - Review Complete 08/22/2019  Allergen Reaction Noted  . Aspirin Hives   . Hydromorphone hcl Hives and Nausea And Vomiting    Past Medical History:  Diagnosis Date  . Anemia   . Arthritis    "neck, hips" (04/25/2018)  . Claustrophobia   . Daily headache   . GERD (gastroesophageal reflux disease)   . Hiatal hernia   . Hyperlipidemia    "hx" (04/25/2018)  . Hypertension   . Plantar fasciitis   . 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   Physical Exam: Blood pressure (!) 163/95, pulse 96, temperature 98.5 F (36.9 C), temperature source Oral, resp. rate 16, weight 57.7 kg, SpO2 100 %. Physical Exam  Constitutional: She is well-developed, well-nourished, and in no distress.  HENT:  Head: Normocephalic and atraumatic.  Eyes: EOM are normal. Right eye exhibits no discharge. Left eye exhibits no discharge.  Neck: No tracheal deviation present.  Cardiovascular: Normal rate and regular rhythm. Exam reveals no gallop and no friction rub.  No murmur heard. Pulmonary/Chest: Effort normal and breath sounds normal. No respiratory distress. She has no wheezes. She has no rales.  Abdominal: Soft. She exhibits no distension. There is no abdominal tenderness. There is no rebound and no guarding.  Musculoskeletal:        General: No tenderness, deformity or edema. Normal range of motion.     Cervical back: Normal range of motion.     Comments: Neer's tests negative, Hawkins positive on right side  Neurological: She is alert. Coordination normal.  Strength 5/5 bilaterally, able to differentiate  light touch and pinprick on both sides  Skin: Skin is warm and dry. No rash noted. She is not diaphoretic. No erythema.  Psychiatric: Memory and judgment normal.    Assessment & Plan by Problem: Principal Problem:   Cervical radiculitis Active Problems:   Numbness and tingling  Patient is a 59 year old female with past medical history significant for hypertension, GERD, anemia who presents with right shoulder pain for an MRI with sedation.  # Cervical radiculitis Patient with clinical picture consistent with cervical  radiculopathy.  Patient is without significant weakness but the pain provides a significant functional impairment in her daily life.   *MR cervical spine without contrast ordered, with general anesthesia *Flexeril 3 mg 3 times daily as needed + Norco 5-325 mg 0.5-1 tablet Q6HR PRN  # Hypertension: Continue home amlodipine 2.5 mg daily + lisinopril 20 mg daily  # COVID-19 status: Patient reports that she gets tested twice a week at her job for Covid, last test yesterday (Thursday 2/25) but does not have the documentation of test with her.  Patient refused to take another COVID-19 test.  This situation was discussed with Tamsen Meek from infectious prevention and it was advised that patient could remain in hospital under person under investigation status, and would not be allowed visitors during hospitalization. Patient is without infectious symptoms.  # GERD: Continue home omeprazole 40 mg daily *Omeprazole 40 mg daily  # Iron deficiency anemia: Continue home iron  Dispo: Admit patient to Observation with expected length of stay less than 2 midnights.  Signed: Jeanmarie Hubert, MD 08/22/2019, 7:07 PM  Pager: [phone]

## 2019-08-22 NOTE — Telephone Encounter (Signed)
TC to Robin in bed placement, she states she has just received approval for the bed and will call patient. SChaplin, RN,BSN

## 2019-08-22 NOTE — Telephone Encounter (Signed)
Pt calling in about being admitted for an MRI today and has not heard from anyone.  Pt is requesting a call back about what to do.

## 2019-08-22 NOTE — Progress Notes (Addendum)
1655 Received pt as direct admit. Pt is A&O x4, ambulating in the room independently with steady gait.  Per pt, she had a COVID test done yesterday with negative result at her work place at [facility]. She does not want to have it done here. Dr Aundra Dubin aware.

## 2019-08-23 DIAGNOSIS — Z8261 Family history of arthritis: Secondary | ICD-10-CM | POA: Diagnosis not present

## 2019-08-23 DIAGNOSIS — F17210 Nicotine dependence, cigarettes, uncomplicated: Secondary | ICD-10-CM | POA: Diagnosis present

## 2019-08-23 DIAGNOSIS — Z20822 Contact with and (suspected) exposure to covid-19: Secondary | ICD-10-CM | POA: Diagnosis present

## 2019-08-23 DIAGNOSIS — D649 Anemia, unspecified: Secondary | ICD-10-CM

## 2019-08-23 DIAGNOSIS — K219 Gastro-esophageal reflux disease without esophagitis: Secondary | ICD-10-CM | POA: Diagnosis not present

## 2019-08-23 DIAGNOSIS — Z886 Allergy status to analgesic agent status: Secondary | ICD-10-CM | POA: Diagnosis not present

## 2019-08-23 DIAGNOSIS — R7989 Other specified abnormal findings of blood chemistry: Secondary | ICD-10-CM | POA: Diagnosis not present

## 2019-08-23 DIAGNOSIS — Z8673 Personal history of transient ischemic attack (TIA), and cerebral infarction without residual deficits: Secondary | ICD-10-CM | POA: Diagnosis not present

## 2019-08-23 DIAGNOSIS — E876 Hypokalemia: Secondary | ICD-10-CM

## 2019-08-23 DIAGNOSIS — D509 Iron deficiency anemia, unspecified: Secondary | ICD-10-CM | POA: Diagnosis present

## 2019-08-23 DIAGNOSIS — M5412 Radiculopathy, cervical region: Secondary | ICD-10-CM | POA: Diagnosis not present

## 2019-08-23 DIAGNOSIS — Z885 Allergy status to narcotic agent status: Secondary | ICD-10-CM | POA: Diagnosis not present

## 2019-08-23 DIAGNOSIS — I10 Essential (primary) hypertension: Secondary | ICD-10-CM | POA: Diagnosis not present

## 2019-08-23 DIAGNOSIS — Z5329 Procedure and treatment not carried out because of patient's decision for other reasons: Secondary | ICD-10-CM | POA: Diagnosis not present

## 2019-08-23 DIAGNOSIS — Z79899 Other long term (current) drug therapy: Secondary | ICD-10-CM | POA: Diagnosis not present

## 2019-08-23 LAB — BASIC METABOLIC PANEL
Anion gap: 8 (ref 5–15)
BUN: 13 mg/dL (ref 6–20)
CO2: 27 mmol/L (ref 22–32)
Calcium: 8.5 mg/dL — ABNORMAL LOW (ref 8.9–10.3)
Chloride: 109 mmol/L (ref 98–111)
Creatinine, Ser: 1.07 mg/dL — ABNORMAL HIGH (ref 0.44–1.00)
GFR calc Af Amer: >60 mL/min (ref >60)
GFR calc non Af Amer: 58 mL/min — ABNORMAL LOW (ref >60)
Glucose, Bld: 107 mg/dL — ABNORMAL HIGH (ref 70–99)
Potassium: 2.8 mmol/L — ABNORMAL LOW (ref 3.5–5.1)
Sodium: 144 mmol/L (ref 135–145)

## 2019-08-23 LAB — HIV ANTIBODY (ROUTINE TESTING W REFLEX): HIV Screen 4th Generation wRfx: NONREACTIVE

## 2019-08-23 LAB — SARS CORONAVIRUS 2 (TAT 6-24 HRS): SARS Coronavirus 2: NEGATIVE

## 2019-08-23 MED ORDER — LACTATED RINGERS IV BOLUS
1000.0000 mL | Freq: Once | INTRAVENOUS | Status: AC
  Administered 2019-08-23: 1000 mL via INTRAVENOUS

## 2019-08-23 MED ORDER — POTASSIUM CHLORIDE CRYS ER 20 MEQ PO TBCR
40.0000 meq | EXTENDED_RELEASE_TABLET | ORAL | Status: AC
  Administered 2019-08-23 (×2): 40 meq via ORAL
  Filled 2019-08-23 (×2): qty 2

## 2019-08-23 NOTE — Progress Notes (Signed)
  Date: 08/23/2019  Patient name: [name]  Medical record number: [id number]  Date of birth: Jul 20, 1960   I have seen and evaluated [name] and discussed their care with the Residency Team.  In brief, patient is a 59 year old female with a past medical history of hypertension, GERD, anemia who presented to the hospital for getting an MRI with sedation.  Patient developed neck pain approximately 2 weeks ago which intermittently shoots down her right hand and is associated with tingling and numbness of the fingers of her right hand.  The pain is worse at night and is relieved partially by Flexeril.  Patient states that this pain is interfering with her work and that she is unable to do her daily activities secondary to the pain.  She was seen in [name] by her PCP who wanted the patient to get an MRI.  However, patient is unable to get an MRI without sedation so she was admitted for a possible MRI with sedation.  No chest pain, no shortness of breath, no palpitations, no diaphoresis, no syncope, no lightheadedness, no headache, no blurry vision, no nausea or vomiting, no abdominal pain, no diarrhea, no fevers or chills.  Patient states that the last time she had an MRI done they put her to sleep and this is what she would want done this time as well.  Patient states that her pain is unchanged today.  She wants her MRI done today or she will go home and have this scheduled.  PMHx, Fam Hx, and/or Soc Hx : As per resident note  Vitals:   08/23/19 0353 08/23/19 0730  BP: 140/80 (!) 141/85  Pulse: 72 65  Resp: 19 16  Temp: 98 F (36.7 C) 98.4 F (36.9 C)  SpO2: 100% 100%   General: Awake, alert, oriented x3, NAD CVs: Regular rate and rhythm, normal heart sounds Lungs: CTA bilaterally Abdomen: Soft, nontender, nondistended, normoactive bowel sounds Extremities: No edema noted, nontender to palpation Neuro: Power 5 out of 5 bilateral upper and lower extremities, sensation intact.   Patient does complain of tingling numbness in the fingers of her right hand Psych: Normal mood and affect HEENT: Normocephalic, atraumatic  Assessment and Plan: I have seen and evaluated the patient as outlined above. I agree with the formulated Assessment and Plan as detailed in the residents' note, with the following changes:   1.  Cervical radiculopathy: -Patient presented to the hospital for the purposes of having an MRI with sedation given new onset cervical radiculopathy over the last 2 weeks which has been interfering with her activities of daily living. -We will continue with pain control with Norco and Flexeril for now -Resident spoke with anesthesiology and radiology and attempt to get an MRI with sedation for the patient today.  However, this cannot be done today.  This could be done tomorrow but patient does not want to stay in the hospital till tomorrow for this. -Per resident discussion with anesthesiology this can be scheduled for an outpatient MRI with sedation and patient is agreeable to this.  MRI will be scheduled for either tomorrow or Monday as an outpatient. -No further work-up at this time. -Patient stable for DC home today.  Ruth Contes, MD 2/27/202111:20 AM

## 2019-08-23 NOTE — Progress Notes (Signed)
OT Cancellation Note  Patient Details Name: [name] MRN: [id number] DOB: 1960-10-19   Cancelled Treatment:    Reason Eval/Treat Not Completed: OT screened, no needs identified, will sign off;Other (comment). Pt awaiting MRI and per her RN, will most likely not happen today and be rescheduled and pt will d/c home. Pt in room walking around independently and does not require any assist with selfcare/ADLs  Britt Bottom 08/23/2019, 12:02 PM

## 2019-08-23 NOTE — Progress Notes (Signed)
PT Cancellation Note  Patient Details Name: [name] MRN: [id number] DOB: 1960-08-22   Cancelled Treatment:    Reason Eval/Treat Not Completed: PT screened, no needs identified, will sign off. Pt awaiting MRI and per her RN, will most likely not happen today and be rescheduled and pt will d/c home. Pt in room walking around independently and does not require any assist with selfcare/ADLs.   [place] 08/23/2019, 2:45 PM

## 2019-08-23 NOTE — Progress Notes (Addendum)
  Subjective:  Patient was laying in her bed this morning. She was in good mood. She stated that it hurts when she lays back. Continues to have pain in back of neck and down arm. She states her fingers tingle all the time.   Objective:    Vital Signs (last 24 hours): Vitals:   08/22/19 1655 08/22/19 1931 08/23/19 0353  BP: (!) 163/95 (!) 148/98 140/80  Pulse: 96 81 72  Resp: 16 17 19   Temp: 98.5 F (36.9 C) 98.4 F (36.9 C) 98 F (36.7 C)  TempSrc: Oral Oral Oral  SpO2: 100% 99% 100%  Weight: 57.7 kg     Physical Exam: General Alert and answers questions appropriately, no acute distress  Cardiac Regular rate and rhythm, no murmurs, rubs, or gallops  Pulmonary Clear to auscultation bilaterally without wheezes, rhonchi, or rales   BMP Latest Ref Rng & Units 08/23/2019 08/21/2019 08/04/2019  Glucose 70 - 99 mg/dL 107(H) 78 92  BUN 6 - 20 mg/dL 13 9 10   Creatinine 0.44 - 1.00 mg/dL 1.07(H) 0.58 0.54  BUN/Creat Ratio 9 - 23 - 16 -  Sodium 135 - 145 mmol/L 144 145(H) 141  Potassium 3.5 - 5.1 mmol/L 2.8(L) 3.5 3.0(L)  Chloride 98 - 111 mmol/L 109 106 104  CO2 22 - 32 mmol/L 27 23 25   Calcium 8.9 - 10.3 mg/dL 8.5(L) 9.1 8.5(L)      Assessment/Plan:   Principal Problem:   Cervical radiculitis Active Problems:   Numbness and tingling  Patient is a 59 year old female with past medical history significant for hypertension, GERD, anemia who presents with right shoulder pain for an MRI with sedation.  # Cervical radiculitis: Picture consistent with cervical radiculopathy. *MR cervical spine without contrast ordered, with general anesthesia - scheduled for tomorrow at 8am. COVID-19 test pending *Flexeril 3 mg 3 times daily as needed + Norco 5-325 mg 0.5-1 tablet every 6 hours as needed *Followup COVID test *NPO at midnight  # Hypertension: Continue home amlodipine 2.5 mg daily + lisinopril 20 mg daily  # Hypokalemia: Potassium 2.8, will replete PO * Check BMP, Mag in AM  #  Elevated creatinine: Creatinine of 1.07 from 0.58 on 08/21/19.  * 1 L LR bolus * Check BMP in AM  PT/OT: Consulted Diet: Regular, NPO at midnight DVT Ppx: Lovenox 40 mg daily Admit Status: Inpatient Dispo: Anticipated discharge in approximately 1 day  Jeanmarie Hubert, MD 08/23/2019, 6:17 AM

## 2019-08-24 ENCOUNTER — Encounter (HOSPITAL_COMMUNITY): Payer: Self-pay | Admitting: Certified Registered Nurse Anesthetist

## 2019-08-24 ENCOUNTER — Encounter (HOSPITAL_COMMUNITY): Payer: Self-pay | Admitting: Internal Medicine

## 2019-08-24 ENCOUNTER — Encounter (HOSPITAL_COMMUNITY)
Admission: AD | Discharge status: Against Medical Advice | Payer: Self-pay | Source: Ambulatory Visit | Attending: Internal Medicine

## 2019-08-24 DIAGNOSIS — K219 Gastro-esophageal reflux disease without esophagitis: Secondary | ICD-10-CM | POA: Diagnosis not present

## 2019-08-24 DIAGNOSIS — M5412 Radiculopathy, cervical region: Secondary | ICD-10-CM | POA: Diagnosis not present

## 2019-08-24 DIAGNOSIS — D649 Anemia, unspecified: Secondary | ICD-10-CM | POA: Diagnosis not present

## 2019-08-24 DIAGNOSIS — I10 Essential (primary) hypertension: Secondary | ICD-10-CM | POA: Diagnosis not present

## 2019-08-24 HISTORY — PX: RADIOLOGY WITH ANESTHESIA: SHX6223

## 2019-08-24 LAB — BASIC METABOLIC PANEL
Anion gap: 8 (ref 5–15)
BUN: 12 mg/dL (ref 6–20)
CO2: 26 mmol/L (ref 22–32)
Calcium: 8.8 mg/dL — ABNORMAL LOW (ref 8.9–10.3)
Chloride: 105 mmol/L (ref 98–111)
Creatinine, Ser: 0.5 mg/dL (ref 0.44–1.00)
GFR calc Af Amer: >60 mL/min (ref >60)
GFR calc non Af Amer: >60 mL/min (ref >60)
Glucose, Bld: 106 mg/dL — ABNORMAL HIGH (ref 70–99)
Potassium: 3.8 mmol/L (ref 3.5–5.1)
Sodium: 139 mmol/L (ref 135–145)

## 2019-08-24 LAB — MAGNESIUM: Magnesium: 1.3 mg/dL — ABNORMAL LOW (ref 1.7–2.4)

## 2019-08-24 SURGERY — MRI WITH ANESTHESIA
Anesthesia: Choice

## 2019-08-24 NOTE — Progress Notes (Signed)
Pt stormed out of unit (AMA) when she was informed that MRI would not be performed this morning.

## 2019-08-24 NOTE — Progress Notes (Signed)
   Subjective: HD#1   Overnight: No acute events reported  It was quite hostile situation this morning as patient was extremely agitated when I walked in the room.  She states that it was inappropriate for have been delayed her MRI since Friday.  I did advise her that I was going to reach out to MRI myself to inquire further delay however she stormed out of the room before I could finish my sentence.    Objective:  Vital signs in last 24 hours: Vitals:   08/23/19 0353 08/23/19 0730 08/23/19 1900 08/24/19 0423  BP: 140/80 (!) 141/85 119/83 (!) 159/93  Pulse: 72 65 77 69  Resp: 19 16 17 17   Temp: 98 F (36.7 C) 98.4 F (36.9 C) 98.6 F (37 C) 97.7 F (36.5 C)  TempSrc: Oral Oral Oral Oral  SpO2: 100% 100% 99% 100%  Weight:       Unable to perform as patient stormed out of the room while we were having a conversation.  Assessment/Plan:  Principal Problem:   Cervical radiculitis Active Problems:   Numbness and tingling  Patient is a 59 year old female with past medical history significant for hypertension, GERD, anemia who presents with right shoulder pain for an MRI with sedation.  # Cervical radiculitis: Picture consistent with cervical radiculopathy. *MR cervical spine without contrast ordered, with general anesthesia  was initially scheduled for 8 AM today however there seemed to have been a delay in MRI and patient was extremely agitated.  I never really got the chance to explain to her the risk and benefits of leaving the hospital without being properly examined.  During my initial conversation, she stormed out of the room. *Flexeril 3 mg 3 times daily as needed + Norco 5-325 mg 0.5-1 tablet every 6 hours as needed *We will reach out to her PCP (Dr. Lynnae January) and update her.   Jean Rosenthal, MD 08/24/2019, 9:23 AM Pager: [phone] Internal Medicine Teaching Service

## 2019-08-24 NOTE — Progress Notes (Signed)
MD in Pt's room and aware of her leaving AMA. Pt did not sign form.

## 2019-08-24 NOTE — Plan of Care (Signed)
  Problem: Pain Managment: Goal: General experience of comfort will improve Outcome: Progressing   Problem: Clinical Measurements: Goal: Diagnostic test results will improve Outcome: Progressing   Problem: Education: Goal: Knowledge of General Education information will improve Description: Including pain rating scale, medication(s)/side effects and non-pharmacologic comfort measures Outcome: Progressing   Problem: Pain Managment: Goal: General experience of comfort will improve Outcome: Progressing

## 2019-08-27 ENCOUNTER — Ambulatory Visit: Payer: Self-pay

## 2019-08-27 ENCOUNTER — Telehealth: Payer: Self-pay

## 2019-08-27 ENCOUNTER — Other Ambulatory Visit: Payer: Self-pay | Admitting: Internal Medicine

## 2019-08-27 ENCOUNTER — Telehealth: Payer: Self-pay | Admitting: *Deleted

## 2019-08-27 ENCOUNTER — Telehealth: Payer: Self-pay | Admitting: Internal Medicine

## 2019-08-27 ENCOUNTER — Other Ambulatory Visit: Payer: Self-pay

## 2019-08-27 ENCOUNTER — Ambulatory Visit (INDEPENDENT_AMBULATORY_CARE_PROVIDER_SITE_OTHER): Payer: 59 | Admitting: Internal Medicine

## 2019-08-27 DIAGNOSIS — M5412 Radiculopathy, cervical region: Secondary | ICD-10-CM | POA: Diagnosis not present

## 2019-08-27 NOTE — Telephone Encounter (Signed)
Patient contacted today regarding need for transportation assistance for MRI with sedation.   Patient states that boyfriend could take her to appointment but this would require him taking time off of work; prefers inpatient for procedure.      Ronn Melena, Anthony Coordination Social Worker [place] [phone]

## 2019-08-27 NOTE — Progress Notes (Signed)
  [facility] Internal Medicine Residency Telephone Encounter Continuity Care Appointment  HPI:   This telephone encounter was created for Ms. [name] on 08/27/2019 for the following purpose/cc cervical neck pain with radiation into the right shoulder pain.   Past Medical History:  Past Medical History:  Diagnosis Date  . Anemia   . Arthritis    "neck, hips" (04/25/2018)  . Claustrophobia   . Daily headache   . GERD (gastroesophageal reflux disease)   . Hiatal hernia   . Hyperlipidemia    "hx" (04/25/2018)  . Hypertension   . Plantar fasciitis   . Stroke ([place]) 05/2014   "mini stroke" (04/25/2018)      ROS:   ROS negative except as per HPI.    Assessment / Plan / Recommendations:   Please see A&P under problem oriented charting for assessment of the patient's acute and chronic medical conditions.   As always, pt is advised that if symptoms worsen or new symptoms arise, they should go to an urgent care facility or to to ER for further evaluation.   Consent and Medical Decision Making:   Patient discussed with Dr. Dareen Piano and Dr. Lynnae January.  This is a telephone encounter between [place] on 08/27/2019 for evaluation of her cervical radiculopathy. The visit was conducted with the patient located at work and [employer] at [facility]. The patient's identity was confirmed using their DOB and current address. The patient has consented to being evaluated through a telephone encounter and understands the associated risks (an examination cannot be done and the patient may need to come in for an appointment) / benefits (allows the patient to remain at home, decreasing exposure to coronavirus). I personally spent 13 minutes on medical discussion.

## 2019-08-27 NOTE — Telephone Encounter (Signed)
Tell her to call me when she wants me to put in admission order.

## 2019-08-27 NOTE — Assessment & Plan Note (Addendum)
  Cervical Radiculitis: Patient concerned with cervical neck pain that radiates in a lighting like manner into her right arm. She stated that the pain is accompanied by numbness at times. The pain has been present for approximately 2+ weeks and includes her arm from her head to her fingertips.  She denotes some improvement with Flexeril but no benefit from gabapentin. The patient stated that she had a similar event on her left side in 2015 that improved with a joint injection. She was admitted on 08/22/2019 for MRI with sedation given her severe claustrophobia and failure with the open MRI.  However, the MRI was bumped from its 8:00 a.m. scheduled appointment time on 08/23/2019 resulting in severe patient agitation and frustration for which she eventually left the hospital AMA. I explained that the nonemergent MRIs are often delayed for more emergent processes such as strokes and that were we to admit her again the same is likely to occur.  She stated that this is unacceptable. Secondarily offered to have the patient scheduled for outpatient MRI with sedation to which she stated "that she wished to speak with her doctor".  She stated multiple times that she does not believe I have her best interest in mind and if I did I would schedule her for an admission with an immediate MRI and subsequent immediate discharge without delay.  I explained that this is simply not plausible nor something that I could accommodate. I again reiterated that if arranged, the same process could occur and she would have to wait until there was an opening.  I explained that I could not guarantee that an MRI would be completed the day of admission.  She again stated that this is unacceptable I again offered the outpatient MRI with sedation a second time to which she stated that we had to do what we needed to do and she would do whatever.  She did not further explain this statement.  I contacted her PCP Dr. Lynnae January and spoke with her about  the patient's concerns.  Her PCP stated that she will work on solving the issue and felt that the outpatient option is most efficacious at this time. Plan: Continue flexeril PRN Continue to pursue MRI Patient hung up the phone prior to completion of our conversation or advise

## 2019-08-27 NOTE — Chronic Care Management (AMB) (Signed)
  Care Management   Social Work Note  08/27/2019 Name: [name] MRN: [id number] DOB: 1961-02-17  [name] is a 59 y.o. year old female who sees Bartholomew Crews, MD for primary care. The Care Management team was consulted for assistance with Transportation Needs .   SDOH (Social Determinants of Health) assessments performed: Yes SDOH Interventions     Most Recent Value  SDOH Interventions  SDOH Interventions for the Following Domains  Transportation  Transportation Interventions  Other (Comment) [patient referred to [facility] for transportation assistance but denied need]        Follow Up Plan: No follow up scheduled. Patient denied need for transportation assistance.    SIGNATURE

## 2019-08-27 NOTE — Progress Notes (Signed)
Internal Medicine Clinic Attending  I saw and evaluated the patient.  I personally confirmed the key portions of the history and exam documented by Dr. Berline Lopes and I reviewed pertinent patient test results.  The assessment, diagnosis, and plan were formulated together and I agree with the documentation in the resident's note.    Dr. Berline Lopes discussed [name] with me.  I called radiology who then referred me to admitting.  We can do an outpatient MRI with deep sedation at [facility] using anesthesiology services.  However, she must have a friend or family member come to the hospital at the conclusion of the procedure to take responsibility of getting her home.  Using a ride sharing service like lyft is not an option.  I then talked to [employer] of [facility] who discussed with the patient.  She does have a boyfriend but he works erratic hours and she does not want to make him take a day off work to transport her to and from her MRI.  She wants to pursue a another inpatient stay to get an MRI.  I told her 3 different times that she will get bumped on the schedule for emergencies and that I had no way of knowing how many days it would take as an inpatient to get the MRI.  She assures me she will stay until the MRI is completed this time.

## 2019-08-27 NOTE — Discharge Summary (Signed)
Name: [name] MRN: [id number] DOB: 1960-07-20 59 y.o. PCP: Bartholomew Crews, MD  Date of Admission: 08/22/2019  4:37 PM Date of Discharge: 08/24/2019 Attending Physician: Dr. Dareen Piano  Discharge Diagnosis: 1.  Cervical radiculopathy  Discharge Medications: Allergies as of 08/24/2019      Reactions   Aspirin Hives   Hydromorphone Hcl Hives, Nausea And Vomiting      Medication List    TAKE these medications   acetaminophen 500 MG tablet Commonly known as: TYLENOL Take 1,000 mg by mouth every 6 (six) hours as needed for moderate pain or headache.   AMBULATORY NON FORMULARY MEDICATION Medication Name: nitroglycerin 0.125 mg ointment mixed with 2% lidocaine three times a day for 6-8 weeks What changed:   how much to take  how to take this  when to take this  additional instructions   amLODipine 2.5 MG tablet Commonly known as: NORVASC TAKE 1 TABLET BY MOUTH DAILY.   carbamazepine 200 MG 12 hr capsule Commonly known as: CARBATROL Take 1 capsule (200 mg total) by mouth 2 (two) times daily.   cyclobenzaprine 5 MG tablet Commonly known as: FLEXERIL Take 1 tablet (5 mg total) by mouth 3 (three) times daily as needed for muscle spasms. What changed: when to take this   ferrous sulfate 325 (65 FE) MG tablet Commonly known as: Iron Supplement Take 1 tablet (325 mg total) by mouth daily with breakfast.   gabapentin 100 MG capsule Commonly known as: NEURONTIN Take 1 capsule (100 mg total) by mouth 3 (three) times daily.   HYDROcodone-acetaminophen 5-325 MG tablet Commonly known as: NORCO/VICODIN Take 1 tablet by mouth every 6 (six) hours as needed. What changed:   how much to take  when to take this  reasons to take this   hydrocortisone 2.5 % rectal cream Commonly known as: ANUSOL-HC Place 1 application rectally 2 (two) times daily.   lisinopril 20 MG tablet Commonly known as: ZESTRIL TAKE 1 TABLET BY MOUTH DAILY.   omeprazole 40 MG  capsule Commonly known as: PRILOSEC TAKE 1 CAPSULE BY MOUTH DAILY.   phenylephrine-shark liver oil-mineral oil-petrolatum 0.25-3-14-71.9 % rectal ointment Commonly known as: PREPARATION H Place 1 application rectally 2 (two) times daily as needed for hemorrhoids.   polycarbophil 625 MG tablet Commonly known as: FiberCon Take 1 tablet (625 mg total) by mouth daily.   pramoxine 1 % foam Commonly known as: Proctofoam Place 1 application rectally 3 (three) times daily as needed for anal itching.   traMADol 50 MG tablet Commonly known as: Ultram Take 1 tablet (50 mg total) by mouth every 12 (twelve) hours as needed.       Disposition and follow-up:   [name] was discharged from [facility] in [place] condition.  At the hospital follow up visit please address:  1.  Cervical radiculopathy: Follow-up with outpatient MRI  2.  Labs / imaging needed at time of follow-up: Obtain outpatient MRI  3.  Pending labs/ test needing follow-up: None  Follow-up Appointments: Follow-up Information    Bartholomew Crews, MD. Call.   Specialty: Internal Medicine Why: Please call on Monday for an appointment to occur early this coming week Contact information: [place] [address] [address] by problem list: 1.  Cervical radiculopathy: [name] is a 59 year old woman with hypertension, GERD and anemia who presented to the hospital with right arm pain.  She described  a 2-week history of episodic shooting pain that starts from her neck and radiates down her right arm to her fingers.  She was scheduled for an inpatient MRI with sedation however this was unable to be performed due to scheduling difficulties and emergency cases.  Patient unfortunately left the hospital as she was frustrated of not obtaining her MRI.  She was counseled to follow-up with her PCP and obtain outpatient MRI under general anesthesia.  While  in the hospital, she was treated with Flexeril and hydrocodone.  Discharge Vitals:   BP (!) 159/93 (BP Location: Left Arm)   Pulse 69   Temp 97.7 F (36.5 C) (Oral)   Resp 17   Wt 57.7 kg   SpO2 100%   BMI 25.69 kg/m   Pertinent Labs, Studies, and Procedures:    Discharge Instructions: Discharge Instructions    Diet - low sodium heart healthy   Complete by: As directed    Increase activity slowly   Complete by: As directed       Signed: Jean Rosenthal, MD 08/27/2019, 2:11 PM   Pager: [phone] Internal Medicine Teaching Service

## 2019-08-27 NOTE — Telephone Encounter (Signed)
Pt requesting a call back in Reference to being admitted next week for her MRI.  Patient states she is willing to wait.

## 2019-08-27 NOTE — Telephone Encounter (Signed)
TC to patient, informed of Dr. Zenovia Jarred instructions to call her/[facility] when she is ready for MD to place admission order.  Pt states Sunday evening(08/31/19) is good for her. Per chart review, admission is pending for 08/31/19. SChaplin, RN,BSN

## 2019-08-27 NOTE — Telephone Encounter (Signed)
Call to inform patient  that Admitting has been called to schedule a bed for admission and that it may take awhile before a bed is ready. Patient stated  that she is unable to come in today.  Can come in on Sunday afternoon at the earliest.  Has some family obligations to complete.   Call to Admitting spoke to Shawn to have patient called on Sunday for admission. Sander Nephew, RN 08/27/2019 2:55 PM.

## 2019-08-31 ENCOUNTER — Encounter (HOSPITAL_COMMUNITY): Payer: Self-pay | Admitting: Internal Medicine

## 2019-08-31 ENCOUNTER — Other Ambulatory Visit: Payer: Self-pay

## 2019-08-31 ENCOUNTER — Observation Stay (HOSPITAL_COMMUNITY)
Admission: AD | Admit: 2019-08-31 | Discharge: 2019-08-31 | Disposition: A | Discharge status: Home / Self Care | Payer: 59 | Source: Ambulatory Visit | Attending: Internal Medicine | Admitting: Internal Medicine

## 2019-08-31 DIAGNOSIS — Z8673 Personal history of transient ischemic attack (TIA), and cerebral infarction without residual deficits: Secondary | ICD-10-CM | POA: Diagnosis not present

## 2019-08-31 DIAGNOSIS — I10 Essential (primary) hypertension: Secondary | ICD-10-CM | POA: Insufficient documentation

## 2019-08-31 DIAGNOSIS — F17210 Nicotine dependence, cigarettes, uncomplicated: Secondary | ICD-10-CM | POA: Insufficient documentation

## 2019-08-31 DIAGNOSIS — K219 Gastro-esophageal reflux disease without esophagitis: Secondary | ICD-10-CM | POA: Diagnosis not present

## 2019-08-31 DIAGNOSIS — D649 Anemia, unspecified: Secondary | ICD-10-CM | POA: Diagnosis not present

## 2019-08-31 DIAGNOSIS — Z79899 Other long term (current) drug therapy: Secondary | ICD-10-CM | POA: Diagnosis not present

## 2019-08-31 DIAGNOSIS — M5412 Radiculopathy, cervical region: Principal | ICD-10-CM | POA: Diagnosis present

## 2019-08-31 MED ORDER — ENOXAPARIN SODIUM 40 MG/0.4ML ~~LOC~~ SOLN: 40.0000 mg | SUBCUTANEOUS | Status: DC

## 2019-08-31 NOTE — Progress Notes (Signed)
Pt discharging today due to MRI with sedation only being on Tuesdays and Thursdays. Per MRI employee earliest appointment is on Thursday. Pt educated on importance of calling internal medicine clinic tomorrow to schedule appropriate appointments. Per MRI note has been made to schedule an appointment. Scheduling will call patient tomorrow.

## 2019-08-31 NOTE — Plan of Care (Signed)

## 2019-08-31 NOTE — Discharge Instructions (Signed)
Please call the internal medicine clinic on 3/8 to schedule MRI with sedation. Pt discharging on 3/7 due to MRI availability (possibly 3/11 will be earliest appointment).   MRI number-- [phone]

## 2019-08-31 NOTE — Progress Notes (Addendum)
[name] is a 59yo female with PMH HTN, HLD, GERD, claustrophobia, and anemia seen by the Internal Medicine Center. She was admitted this evening patient for inpatient cervical MRI for cervical radiculitis as she has severe claustrophobia and requires anesthesia for procedure. This has been a chronic issue. She has symptoms of shooting pain down her right arm that is somewhat relieved with flexaril. She has no red flag symptoms that require emergent MRI. Patient was admitted previously but unable to complete procedure in a satisfactory time period and left AMA. After admission today case was discussed with radiology and anesthesia is only performed with MRI on Tuesdays and Thursdays. As there was no way to guarantee she would receive an MRI Tuesday and may have to wait until Thursday she opted to follow-up with a scheduled MRI. Patient was discharged in normal condition to follow-up with the internal medicine center to schedule MRI.   Molli Hazard A, DO 09/01/2019, 4:45 PM Pager: [phone]

## 2019-09-01 ENCOUNTER — Ambulatory Visit (HOSPITAL_COMMUNITY): Payer: 59

## 2019-09-01 ENCOUNTER — Other Ambulatory Visit (HOSPITAL_COMMUNITY): Payer: Self-pay | Admitting: Internal Medicine

## 2019-09-01 ENCOUNTER — Other Ambulatory Visit (HOSPITAL_COMMUNITY): Payer: Self-pay | Admitting: Emergency Medicine

## 2019-09-01 ENCOUNTER — Telehealth: Payer: Self-pay | Admitting: Licensed Clinical Social Worker

## 2019-09-01 DIAGNOSIS — M5412 Radiculopathy, cervical region: Secondary | ICD-10-CM

## 2019-09-01 NOTE — Telephone Encounter (Signed)
She does not have red flags and her pain has only been 2 weeks.  She has not yet been on maximum medical therapy.  I had scheduled the MRI as it was affecting her ability to work.  She has been admitted twice.  Due to lack of red flag symptoms, brevity of symptoms, and no need for an emergent MRI, I will not pursue a third admission for an inpatient MRI.  We offered her an [facility] appointment to further discuss and to maximize her medical therapy and she declined.

## 2019-09-01 NOTE — Telephone Encounter (Signed)
The patient was sch for her COVID test today @ 2:15pm @ [facility] and for her MRI (Outpatient) on 09/04/2019 with [facility] radiology.  Spoke with the patient and she has declined appointment due to transportation and not having anyone to accompany her to her visit on Thursday 09/04/2019.  Per your request as well if she declined the (Outpatient MRI ), the patient was offered an [facility] appointment on Thursday  instead and she refused and declined appointment.

## 2019-09-01 NOTE — Telephone Encounter (Signed)
Patient was called to discuss the referral. Patient did not answer, and a vm was left for the patient. A letter will also be mailed.

## 2019-09-02 ENCOUNTER — Encounter: Payer: Self-pay | Admitting: Licensed Clinical Social Worker

## 2019-09-03 MED FILL — LISINOPRIL 20 MG TABLET: 20 | 30 days supply | Qty: 30 | Fill #2

## 2019-09-03 MED FILL — AMLODIPINE 2.5 MG TABLET: 2.5 | 30 days supply | Qty: 30 | Fill #2

## 2019-09-04 ENCOUNTER — Ambulatory Visit (HOSPITAL_COMMUNITY): Payer: 59

## 2019-09-04 ENCOUNTER — Ambulatory Visit: Admit: 2019-09-04 | Payer: 59

## 2019-09-04 SURGERY — MRI WITH ANESTHESIA
Anesthesia: General

## 2019-09-04 NOTE — Telephone Encounter (Signed)
Patient called back and sch an appt with you on 09/18/2019.

## 2019-09-05 MED FILL — OMEPRAZOLE 40 MG CPDR: 40 | 90 days supply | Qty: 90 | Fill #1

## 2019-09-05 NOTE — H&P (Signed)
Date: 09/05/2019               Patient Name:  [name] MRN: [id number]  DOB: 10-30-1960 Age / Sex: 59 y.o., female   PCP: Bartholomew Crews, MD         Medical Service: Internal Medicine Teaching Service         Attending Physician: Dr. Rayne Du att. providers found    First Contact: Dr. Ronnald Ramp Pager:   Second Contact: Dr. Sharon Seller Pager: [phone]       After Hours (After 5p/  First Contact Pager: [phone]  weekends / holidays): Second Contact Pager: [phone]   Chief Complaint: cervical radiculitis  History of Present Illness:  [name] is a 59yo female with PMH HTN, HLD, GERD, claustrophobia, and anemia seen by the Internal Medicine Center. She was admitted this evening for inpatient cervical MRI for cervical radiculitis as she has severe claustrophobia and requires anesthesia for procedure. This has been a chronic issue. She has symptoms of shooting pain down her right arm that is somewhat relieved with flexaril. She has no red flag symptoms that require emergent MRI. Patient was admitted previously but unable to complete procedure in a satisfactory time period and left AMA. [name], DO 09/01/2019, 4:45 PM Pager: [phone]  Social:   Lives in [place] working at [employer] Smokes 3 cigarettes per day, drinks socially  Family History:  Family History  Problem Relation Age of Onset  . Cancer Mother 80       pancreatic cancer  . Pancreatic cancer Mother   . Heart disease Mother   . Cancer Father 53       throat cancer  . Cancer Maternal Aunt 60       breast cancer  . Cancer Other        Died  from Colon cancer in 67's  . Colon cancer Maternal Uncle   . Colon cancer Paternal Uncle   . Heart disease Sister      Meds:  Current Meds  Medication Sig  . acetaminophen (TYLENOL) 500 MG tablet Take 1,000 mg by mouth every 6 (six) hours as needed for moderate pain or headache.  . AMBULATORY NON FORMULARY MEDICATION Medication  Name: nitroglycerin 0.125 mg ointment mixed with 2% lidocaine three times a day for 6-8 weeks (Patient taking differently: Place 1 application rectally See admin instructions. Medication Name: nitroglycerin 0.125 mg ointment mixed with 2% lidocaine  - use rectally daily as needed for hemorrhoids - compounded at [facility].)  . amLODipine (NORVASC) 2.5 MG tablet TAKE 1 TABLET BY MOUTH DAILY. (Patient taking differently: Take 2.5 mg by mouth daily. )  . cyclobenzaprine (FLEXERIL) 5 MG tablet Take 1 tablet (5 mg total) by mouth 3 (three) times daily as needed for muscle spasms. (Patient taking differently: Take 5 mg by mouth at bedtime as needed for muscle spasms. )  . ferrous sulfate (IRON SUPPLEMENT) 325 (65 FE) MG tablet Take 1 tablet (325 mg total) by mouth daily with breakfast.  . HYDROcodone-acetaminophen (NORCO/VICODIN) 5-325 MG tablet Take 1 tablet by mouth every 6 (six) hours as needed. (Patient taking differently: Take 0.5 tablets by mouth at bedtime as needed for moderate pain. )  . hydrocortisone (ANUSOL-HC) 2.5 % rectal cream Place 1 application rectally 2 (two) times daily.  Marland Kitchen lisinopril (ZESTRIL) 20 MG tablet TAKE 1 TABLET BY MOUTH DAILY. (Patient taking differently: Take 20 mg by mouth daily. )  . omeprazole (PRILOSEC)  40 MG capsule TAKE 1 CAPSULE BY MOUTH DAILY. (Patient taking differently: Take 40 mg by mouth daily. )     Allergies: Allergies as of 08/27/2019 - Review Complete 08/24/2019  Allergen Reaction Noted  . Aspirin Hives   . Hydromorphone hcl Hives and Nausea And Vomiting    Past Medical History:  Diagnosis Date  . Anemia   . Arthritis    "neck, hips" (04/25/2018)  . Claustrophobia   . Daily headache   . GERD (gastroesophageal reflux disease)   . Hiatal hernia   . Hyperlipidemia    "hx" (04/25/2018)  . Hypertension   . Plantar fasciitis   . Stroke [facility]) 05/2014   "mini stroke" (04/25/2018)     Review of Systems: A complete ROS was negative except as  per HPI.   Physical Exam: Blood pressure (!) 133/96, pulse 86, temperature 98.9 F (37.2 C), temperature source Oral, resp. rate 16, SpO2 100 %.  Constitution: NAD, obese Cardio: regular rate & rhythm Respiratory: non-labored breathing, on room air MSK: moving all extremities Neuro: a&ox3, normal affect Skin: c/d/i    Assessment & Plan by Problem: Active Problems:   Cervical radiculitis  Cervical Radiculitis  59yo female with PMH HTN, HLD, GERD, claustrophobia, and anemia  admitted for cervical MRI under anesthesia.   - MRI without contrast with general anesthesia.  - continue home medications  Diet: regular VTE: lovenox IVF: none Code: full  Dispo: Admit patient to Observation with expected length of stay less than 2 midnights.  SignedMarty Heck, DO 09/05/2019, 10:13 AM  Pager: [phone]

## 2019-09-05 NOTE — Discharge Summary (Signed)
After admission today case was discussed with radiology and anesthesia is only performed with MRI on Tuesdays and Thursdays. As there was no way to guarantee she would receive an MRI Tuesday and may have to wait until Thursday she opted to follow-up with a scheduled MRI. Patient was discharged in stable condition to follow-up with the internal medicine center to schedule MRI.   Molli Hazard A, DO 09/05/2019, 10:21 AM Pager: [phone]

## 2019-09-10 ENCOUNTER — Ambulatory Visit: Payer: Self-pay

## 2019-09-11 ENCOUNTER — Encounter (HOSPITAL_COMMUNITY): Payer: Self-pay | Admitting: Emergency Medicine

## 2019-09-11 ENCOUNTER — Emergency Department (HOSPITAL_COMMUNITY)
Admission: EM | Admit: 2019-09-11 | Discharge: 2019-09-11 | Disposition: A | Discharge status: Home / Self Care | Payer: 59 | Attending: Emergency Medicine | Admitting: Emergency Medicine

## 2019-09-11 DIAGNOSIS — T50Z95A Adverse effect of other vaccines and biological substances, initial encounter: Secondary | ICD-10-CM | POA: Diagnosis not present

## 2019-09-11 DIAGNOSIS — F17210 Nicotine dependence, cigarettes, uncomplicated: Secondary | ICD-10-CM | POA: Insufficient documentation

## 2019-09-11 DIAGNOSIS — I10 Essential (primary) hypertension: Secondary | ICD-10-CM | POA: Insufficient documentation

## 2019-09-11 DIAGNOSIS — M791 Myalgia, unspecified site: Secondary | ICD-10-CM | POA: Insufficient documentation

## 2019-09-11 DIAGNOSIS — Z79899 Other long term (current) drug therapy: Secondary | ICD-10-CM | POA: Diagnosis not present

## 2019-09-11 DIAGNOSIS — R52 Pain, unspecified: Secondary | ICD-10-CM

## 2019-09-11 LAB — BASIC METABOLIC PANEL
Anion gap: 14 (ref 5–15)
BUN: 11 mg/dL (ref 6–20)
CO2: 22 mmol/L (ref 22–32)
Calcium: 8.8 mg/dL — ABNORMAL LOW (ref 8.9–10.3)
Chloride: 106 mmol/L (ref 98–111)
Creatinine, Ser: 0.61 mg/dL (ref 0.44–1.00)
GFR calc Af Amer: >60 mL/min (ref >60)
GFR calc non Af Amer: >60 mL/min (ref >60)
Glucose, Bld: 90 mg/dL (ref 70–99)
Potassium: 3.7 mmol/L (ref 3.5–5.1)
Sodium: 142 mmol/L (ref 135–145)

## 2019-09-11 LAB — CBC WITH DIFFERENTIAL/PLATELET
Abs Immature Granulocytes: 0.03 10*3/uL (ref 0.00–0.07)
Basophils Absolute: 0 10*3/uL (ref 0.0–0.1)
Basophils Relative: 0 %
Eosinophils Absolute: 0 10*3/uL (ref 0.0–0.5)
Eosinophils Relative: 0 %
HCT: 34.2 % — ABNORMAL LOW (ref 36.0–46.0)
Hemoglobin: 10.6 g/dL — ABNORMAL LOW (ref 12.0–15.0)
Immature Granulocytes: 0 %
Lymphocytes Relative: 13 %
Lymphs Abs: 1 10*3/uL (ref 0.7–4.0)
MCH: 22.8 pg — ABNORMAL LOW (ref 26.0–34.0)
MCHC: 31 g/dL (ref 30.0–36.0)
MCV: 73.7 fL — ABNORMAL LOW (ref 80.0–100.0)
Monocytes Absolute: 0.5 10*3/uL (ref 0.1–1.0)
Monocytes Relative: 7 %
Neutro Abs: 6.2 10*3/uL (ref 1.7–7.7)
Neutrophils Relative %: 80 %
Platelets: 267 10*3/uL (ref 150–400)
RBC: 4.64 MIL/uL (ref 3.87–5.11)
RDW: 14.4 % (ref 11.5–15.5)
WBC: 7.7 10*3/uL (ref 4.0–10.5)
nRBC: 0 % (ref 0.0–0.2)

## 2019-09-11 LAB — CK: Total CK: 59 U/L (ref 38–234)

## 2019-09-11 MED ORDER — SODIUM CHLORIDE 0.9 % IV BOLUS
1000.0000 mL | Freq: Once | INTRAVENOUS | Status: AC
  Administered 2019-09-11: 1000 mL via INTRAVENOUS

## 2019-09-11 MED ORDER — ACETAMINOPHEN 500 MG PO TABS
1000.0000 mg | ORAL_TABLET | Freq: Once | ORAL | Status: AC
  Administered 2019-09-11: 12:00:00 1000 mg via ORAL
  Filled 2019-09-11 (×2): qty 2

## 2019-09-11 NOTE — ED Provider Notes (Signed)
[facility] EMERGENCY DEPARTMENT Provider Note   CSN: [id number] Arrival date & time: 09/11/19  1104    History Chief Complaint  Patient presents with  . Covid Vaccine   . Generalized Body Aches    [name] is a 59 y.o. female with past medical history significant for cluster phobia, anemia, GERD, hyperlipidemia, hypertension who presents for evaluation of body aches and pains.  Patient states she had a congested Covid vaccine.  States she has had chills at home however is not taking her temperature.  She took 1 dose of Tylenol at 5 AM this morning.  Has had some mild nausea without emesis.  Denies headache, vision changes, chest pain, shortness of breath, cough, abdominal pain, diarrhea, dysuria, unilateral leg swelling, redness or warmth.  Denies additional aggravating or alleviating factors.  History obtained from patient and past medical records.  No interpreter is used.  HPI     Past Medical History:  Diagnosis Date  . Anemia   . Arthritis    "neck, hips" (04/25/2018)  . Claustrophobia   . Daily headache   . GERD (gastroesophageal reflux disease)   . Hiatal hernia   . Hyperlipidemia    "hx" (04/25/2018)  . Hypertension   . Plantar fasciitis   . Stroke [facility]) 05/2014   "mini stroke" (04/25/2018)    Patient Active Problem List   Diagnosis Date Noted  . Numbness and tingling 08/22/2019  . Pain, dental 02/17/2019  . External hemorrhoids 09/14/2017  . H/O TIA (transient ischemic attack) and stroke 06/12/2014  . Cervical radiculitis 02/12/2014  . Healthcare maintenance 04/30/2013  . Essential hypertension, benign 01/24/2010  . Anemia 01/11/2010  . Hyperlipemia 05/28/2007  . TOBACCO ABUSE 05/28/2007  . GERD 05/28/2007    Past Surgical History:  Procedure Laterality Date  . ABDOMINAL HYSTERECTOMY  12/09   Secondary to fibroids  . ARTERY BIOPSY Right 04/10/2018   Procedure: BIOPSY TEMPORAL ARTERY;  Surgeon: Rosetta Posner, MD;  Location:  [place];  Service: Vascular;  Laterality: Right;  . [place]; 1988  . FOOT SURGERY Left 03/2017   Bone Spurs Removed  . RADIOLOGY WITH ANESTHESIA N/A 04/26/2018   Procedure: MRI WITH ANESTHESIA;  Surgeon: Radiologist, Medication, MD;  Location: [facility];  Service: Radiology;  Laterality: N/A;  . RADIOLOGY WITH ANESTHESIA N/A 08/24/2019   Procedure: MRI WITH ANESTHESIA CERVICAL SPINE;  Surgeon: Radiologist, Medication, MD;  Location: [facility];  Service: Radiology;  Laterality: N/A;  . TUBAL LIGATION  1988     OB History   No obstetric history on file.     Family History  Problem Relation Age of Onset  . Cancer Mother 76       pancreatic cancer  . Pancreatic cancer Mother   . Heart disease Mother   . Cancer Father 10       throat cancer  . Cancer Maternal Aunt 60       breast cancer  . Cancer Other        Died  from Colon cancer in 31's  . Colon cancer Maternal Uncle   . Colon cancer Paternal Uncle   . Heart disease Sister     Social History   Tobacco Use  . Smoking status: Current Some Day Smoker    Packs/day: 0.00    Years: 38.00    Pack years: 0.00    Types: Cigarettes  . Smokeless tobacco: Never Used  . Tobacco comment: 4 cigs/day   Substance  Use Topics  . Alcohol use: Yes    Comment: 2 beers on weekend  . Drug use: Not Currently    Home Medications Prior to Admission medications   Medication Sig Start Date End Date Taking? Authorizing Provider  acetaminophen (TYLENOL) 500 MG tablet Take 1,000 mg by mouth every 6 (six) hours as needed for moderate pain or headache.    [provider]  AMBULATORY NON FORMULARY MEDICATION Medication Name: nitroglycerin 0.125 mg ointment mixed with 2% lidocaine three times a day for 6-8 weeks Patient taking differently: Place 1 application rectally See admin instructions. Medication Name: nitroglycerin 0.125 mg ointment mixed with 2% lidocaine  - use rectally daily as needed for hemorrhoids - compounded at [facility]. 05/09/18   Bartholomew Crews, MD  amLODipine (NORVASC) 2.5 MG tablet TAKE 1 TABLET BY MOUTH DAILY. Patient taking differently: Take 2.5 mg by mouth daily.  07/02/19   Bartholomew Crews, MD  carbamazepine (CARBATROL) 200 MG 12 hr capsule Take 1 capsule (200 mg total) by mouth 2 (two) times daily. Patient not taking: Reported on 08/04/2019 05/09/18   Bartholomew Crews, MD  cyclobenzaprine (FLEXERIL) 5 MG tablet Take 1 tablet (5 mg total) by mouth 3 (three) times daily as needed for muscle spasms. Patient taking differently: Take 5 mg by mouth at bedtime as needed for muscle spasms.  08/05/19   Al Decant, MD  ferrous sulfate (IRON SUPPLEMENT) 325 (65 FE) MG tablet Take 1 tablet (325 mg total) by mouth daily with breakfast. 03/01/16   Bartholomew Crews, MD  gabapentin (NEURONTIN) 100 MG capsule Take 1 capsule (100 mg total) by mouth 3 (three) times daily. Patient not taking: Reported on 08/22/2019 03/20/19   Bartholomew Crews, MD  HYDROcodone-acetaminophen (NORCO/VICODIN) 5-325 MG tablet Take 1 tablet by mouth every 6 (six) hours as needed. Patient taking differently: Take 0.5 tablets by mouth at bedtime as needed for moderate pain.  07/29/19   Bartholomew Crews, MD  hydrocortisone (ANUSOL-HC) 2.5 % rectal cream Place 1 application rectally 2 (two) times daily. 05/09/18   Bartholomew Crews, MD  lisinopril (ZESTRIL) 20 MG tablet TAKE 1 TABLET BY MOUTH DAILY. Patient taking differently: Take 20 mg by mouth daily.  07/02/19   Bartholomew Crews, MD  omeprazole (PRILOSEC) 40 MG capsule TAKE 1 CAPSULE BY MOUTH DAILY. Patient taking differently: Take 40 mg by mouth daily.  05/26/19   Bartholomew Crews, MD  phenylephrine-shark liver oil-mineral oil-petrolatum (PREPARATION H) 0.25-3-14-71.9 % rectal ointment Place 1 application rectally 2 (two) times daily as needed for hemorrhoids. Patient not taking: Reported on 08/22/2019 11/25/18   Modena Nunnery D, DO  polycarbophil (FIBERCON)  625 MG tablet Take 1 tablet (625 mg total) by mouth daily. Patient not taking: Reported on 08/04/2019 11/25/18 11/25/19  Modena Nunnery D, DO  pramoxine (PROCTOFOAM) 1 % foam Place 1 application rectally 3 (three) times daily as needed for anal itching. Patient not taking: Reported on 08/04/2019 11/25/18   Modena Nunnery D, DO  traMADol (ULTRAM) 50 MG tablet Take 1 tablet (50 mg total) by mouth every 12 (twelve) hours as needed. Patient not taking: Reported on 08/04/2019 05/14/19 05/13/20  Welford Roche, MD    Allergies    Aspirin and Hydromorphone hcl  Review of Systems   Review of Systems  Constitutional: Positive for chills and fatigue. Negative for activity change, appetite change, diaphoresis, fever and unexpected weight change.  HENT: Negative.   Respiratory: Negative.   Cardiovascular: Negative.  Gastrointestinal: Negative.   Musculoskeletal: Positive for myalgias. Negative for neck pain and neck stiffness.  Skin: Negative.   Neurological: Positive for weakness (Generalized). Negative for dizziness, tremors, seizures, syncope, facial asymmetry, speech difficulty, light-headedness, numbness and headaches.  All other systems reviewed and are negative.   Physical Exam Updated Vital Signs BP (!) 149/104 (BP Location: Left Arm)   Pulse (!) 104   Temp (!) 100.4 F (38 C) (Oral)   Resp 20   SpO2 98%   Physical Exam Vitals and nursing note reviewed.  Constitutional:      General: She is not in acute distress.    Appearance: She is well-developed. She is not ill-appearing, toxic-appearing or diaphoretic.  HENT:     Head: Normocephalic and atraumatic.     Jaw: There is normal jaw occlusion.     Right Ear: Tympanic membrane, ear canal and external ear normal. There is no impacted cerumen. No hemotympanum. Tympanic membrane is not injected, scarred, perforated, erythematous, retracted or bulging.     Left Ear: Tympanic membrane, ear canal and external ear normal. There is  no impacted cerumen. No hemotympanum. Tympanic membrane is not injected, scarred, perforated, erythematous, retracted or bulging.     Ears:     Comments: No Mastoid tenderness.    Nose:     Comments: Clear rhinorrhea and congestion to bilateral nares.  No sinus tenderness.    Mouth/Throat:     Comments: Posterior oropharynx clear.  Mucous membranes moist.  Tonsils without erythema or exudate.  Uvula midline without deviation.  No evidence of PTA or RPA.  No drooling, dysphasia or trismus.  Phonation normal. Eyes:     Pupils: Pupils are equal, round, and reactive to light.  Neck:     Trachea: Trachea and phonation normal.     Meningeal: Brudzinski's sign and Kernig's sign absent.     Comments: No Neck stiffness or neck rigidity.  No meningismus.  No cervical lymphadenopathy. Cardiovascular:     Rate and Rhythm: Normal rate.     Comments: No murmurs rubs or gallops. Pulmonary:     Effort: No respiratory distress.     Comments: Clear to auscultation bilaterally without wheeze, rhonchi or rales.  No accessory muscle usage.  Able speak in full sentences. Abdominal:     General: There is no distension.     Comments: Soft, nontender without rebound or guarding.  No CVA tenderness.  Musculoskeletal:        General: Normal range of motion.     Cervical back: Normal range of motion.     Comments: Moves all 4 extremities without difficulty.  Lower extremities without edema, erythema or warmth.  Skin:    General: Skin is warm and dry.     Comments: Brisk capillary refill.  No rashes or lesions.  Neurological:     Mental Status: She is alert.     Comments: Ambulatory in department without difficulty.  Cranial nerves II through XII grossly intact.  No facial droop.  No aphasia.    ED Results / Procedures / Treatments   Labs (all labs ordered are listed, but only abnormal results are displayed) Labs Reviewed  CBC WITH DIFFERENTIAL/PLATELET - Abnormal; Notable for the following components:       Result Value   Hemoglobin 10.6 (*)    HCT 34.2 (*)    MCV 73.7 (*)    MCH 22.8 (*)    All other components within normal limits  BASIC METABOLIC PANEL - Abnormal; Notable  for the following components:   Calcium 8.8 (*)    All other components within normal limits  CK    EKG EKG Interpretation  Date/Time:  Thursday September 11 2019 12:03:55 EDT Ventricular Rate:  93 PR Interval:    QRS Duration: 94 QT Interval:  377 QTC Calculation: 469 R Axis:   100 Text Interpretation: Sinus rhythm Right axis deviation Confirmed by Veryl Speak [phone]) on 09/11/2019 12:40:42 PM   Radiology No results found.  Procedures Procedures (including critical care time)  Medications Ordered in ED Medications  sodium chloride 0.9 % bolus 1,000 mL (1,000 mLs Intravenous New Bag/Given 09/11/19 1220)  acetaminophen (TYLENOL) tablet 1,000 mg (1,000 mg Oral Given 09/11/19 1220)    ED Course  I have reviewed the triage vital signs and the nursing notes.  Pertinent labs & imaging results that were available during my care of the patient were reviewed by me and considered in my medical decision making (see chart for details).  59 year old female who is otherwise well presents for evaluation of body aches and pains.  She does have a low-grade temp to 100.4 however appears well.  Recently received Covid vaccine yesterday.  She has no chest pain, shortness of breath.  No evidence of DVT on exam.  Heart and lungs clear.  Abdomen soft, nontender.  She is tolerating p.o. intake without difficulty.  No joint pain, redness or swelling.  High suspicion for side effects from Covid vaccine however will obtain some baseline labs, give her some fluids and Tylenol and reevaluate.  Labs without significant abnormality.  1330: Patient reassessed.  Feels significant improvement after fluids and Tylenol.  Heart rate has come down into the 80s.  Low suspicion for ACS, pneumothorax, sepsis, bacterial infection or PE as cause of  her mild tachycardia when she initially presented.  Discussed symptomatic management at home.  She voiced understanding is agreeable for follow-up.  The patient has been appropriately medically screened and/or stabilized in the ED. I have low suspicion for any other emergent medical condition which would require further screening, evaluation or treatment in the ED or require inpatient management.  Patient is hemodynamically stable and in no acute distress.  Patient able to ambulate in department prior to ED.  Evaluation does not show acute pathology that would require ongoing or additional emergent interventions while in the emergency department or further inpatient treatment.  I have discussed the diagnosis with the patient and answered all questions.  Pain is been managed while in the emergency department and patient has no further complaints prior to discharge.  Patient is comfortable with plan discussed in room and is stable for discharge at this time.  I have discussed strict return precautions for returning to the emergency department.  Patient was encouraged to follow-up with PCP/specialist refer to at discharge.   MDM Rules/Calculators/A&P                      [name] was evaluated in Emergency Department on 09/11/2019 for the symptoms described in the history of present illness. She was evaluated in the context of the global COVID-19 pandemic, which necessitated consideration that the patient might be at risk for infection with the SARS-CoV-2 virus that causes COVID-19. Institutional protocols and algorithms that pertain to the evaluation of patients at risk for COVID-19 are in a state of rapid change based on information released by regulatory bodies including the CDC and federal and state organizations. These policies and algorithms were followed  during the patient's care in the ED. Final Clinical Impression(s) / ED Diagnoses Final diagnoses:  Body aches  Side effects of vaccination,  initial encounter    Rx / DC Orders ED Discharge Orders    None       Alyssah Algeo A, PA-C 09/11/19 1341    Veryl Speak, MD 09/11/19 1552

## 2019-09-11 NOTE — ED Notes (Signed)
Patient verbalizes understanding of discharge instructions. Opportunity for questioning and answers were provided. Armband removed by staff. Patient discharged from ED.  

## 2019-09-11 NOTE — Discharge Instructions (Signed)
May take Tylenol and ibuprofen as needed for body aches and pains.  Make sure to drink plenty of fluids.  You may rest at home.  I have written out of work for tomorrow however if you feel your symptoms have improved you may return to work tomorrow.

## 2019-09-11 NOTE — ED Triage Notes (Signed)
Pt with generalized body aches and fever after taking the 2nd dose of Covid vaccine yesterday. Reports taking tylenol but feeling too bad.

## 2019-09-18 ENCOUNTER — Encounter: Payer: Self-pay | Admitting: Internal Medicine

## 2019-09-18 ENCOUNTER — Ambulatory Visit (INDEPENDENT_AMBULATORY_CARE_PROVIDER_SITE_OTHER): Payer: 59 | Admitting: Internal Medicine

## 2019-09-18 ENCOUNTER — Other Ambulatory Visit: Payer: Self-pay

## 2019-09-18 DIAGNOSIS — Z79899 Other long term (current) drug therapy: Secondary | ICD-10-CM

## 2019-09-18 DIAGNOSIS — Z79891 Long term (current) use of opiate analgesic: Secondary | ICD-10-CM | POA: Diagnosis not present

## 2019-09-18 DIAGNOSIS — M5412 Radiculopathy, cervical region: Secondary | ICD-10-CM | POA: Diagnosis not present

## 2019-09-18 MED ORDER — HYDROCODONE-ACETAMINOPHEN 5-325 MG PO TABS: 1.0000 | ORAL_TABLET | Freq: Every evening | ORAL | 0 refills | Status: DC | PRN

## 2019-09-18 MED ORDER — PREGABALIN 75 MG PO CAPS: 75.0000 mg | ORAL_CAPSULE | Freq: Three times a day (TID) | ORAL | 0 refills | Status: DC

## 2019-09-18 MED FILL — HYDROCODON-APAP 5-325: 5-325 | 20 days supply | Qty: 20 | Fill #0

## 2019-09-18 NOTE — Progress Notes (Signed)
   Subjective:    Patient ID: [name], female    DOB: 07/25/1960, 59 y.o.   MRN: [id number]  HPI  [name] is here for R arm pain. Please see the A&P for the status of the pt's chronic medical problems.  ROS : per ROS section and in problem oriented charting. All other systems are negative.  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  Neurological: Positive for numbness. Negative for weakness.  Psychiatric/Behavioral: Positive for sleep disturbance.       Objective:   Physical Exam Constitutional:      Appearance: Normal appearance.  Neurological:     General: No focal deficit present.     Mental Status: She is alert. Mental status is at baseline.     Comments: RUE strength 5/5 all muscle grps, no fasciculations, no atrophy  Psychiatric:        Mood and Affect: Mood normal.        Behavior: Behavior normal.        Thought Content: Thought content normal.        Judgment: Judgment normal.       Assessment & Plan:

## 2019-09-18 NOTE — Patient Instructions (Signed)
Pt declined

## 2019-09-18 NOTE — Assessment & Plan Note (Signed)
This problem is chronic and uncontrolled.  Her pain has not changed since my last appointment with her but is severe and interfering with her ability to work.  She has numbness of the right hand including all the fingers.  She does not have numbness proximal to the wrist.  She has pain over the anterior surface of the right upper arm and also the right lateral posterior neck.  She has not noticed any weakness in her right upper extremity.  On examination, she has no weakness of the right upper extremity.  There are no red flags on history or examination today.  However, due to the severity of symptoms, no improvement, and interference with her job duties I will pursue MRI as an outpatient and maximize pharmaceutical treatment.  She did not feel gabapentin helped with her left hip pain so we decided to try pregabalin 75 mg 3 times daily.  I also recommended acetaminophen and ibuprofen scheduled throughout the day with meals and refilled hydrocodone to be used as needed at bedtime.  She is severely claustrophobic and does not feel she can tolerate an open MRI.  We will order MRI with heavy sedation at [facility] and she will get her niece to pick her up after the procedure.  PLAN : Scheduled acetaminophen and ibuprofen during the day Pregabalin 75 3 times daily Hydrocodone as needed at bedtime MRI cervical spine with heavy sedation

## 2019-09-22 ENCOUNTER — Telehealth: Payer: Self-pay | Admitting: *Deleted

## 2019-09-22 NOTE — Telephone Encounter (Signed)
Received faxed PA request from pt' pharmacy ([facility]) for lyrica-request submitted online via cover my meds (with assistance from pcp).  Request sent for review.Despina Hidden Cassady3/29/20214:21 PM

## 2019-09-25 ENCOUNTER — Encounter: Payer: Self-pay | Admitting: Internal Medicine

## 2019-09-25 ENCOUNTER — Ambulatory Visit (INDEPENDENT_AMBULATORY_CARE_PROVIDER_SITE_OTHER): Payer: 59 | Admitting: Internal Medicine

## 2019-09-25 ENCOUNTER — Other Ambulatory Visit: Payer: Self-pay

## 2019-09-25 VITALS — BP 156/92 | HR 88 | Temp 98.4°F | Wt 123.8 lb

## 2019-09-25 DIAGNOSIS — R634 Abnormal weight loss: Secondary | ICD-10-CM

## 2019-09-25 DIAGNOSIS — Z6825 Body mass index (BMI) 25.0-25.9, adult: Secondary | ICD-10-CM | POA: Diagnosis not present

## 2019-09-25 DIAGNOSIS — R197 Diarrhea, unspecified: Secondary | ICD-10-CM | POA: Diagnosis not present

## 2019-09-25 DIAGNOSIS — K529 Noninfective gastroenteritis and colitis, unspecified: Secondary | ICD-10-CM

## 2019-09-25 NOTE — Progress Notes (Signed)
   Subjective:    Patient ID: [name], female    DOB: June 06, 1961, 59 y.o.   MRN: [id number]  HPI  [name] is here for diarrhea. Please see the A&P for the status of the pt's chronic medical problems.  ROS : per ROS section and in problem oriented charting. All other systems are negative.  PMHx, Soc hx, and / or Fam hx : Continues to work but has difficulties due to [name] right radicular pain and now diarrhea  Review of Systems  Constitutional: Positive for appetite change and unexpected weight change.  Gastrointestinal: Positive for diarrhea. Negative for abdominal pain, anal bleeding, blood in stool and constipation.       Objective:   Physical Exam Constitutional:      General: [name] is not in acute distress.    Appearance: Normal appearance. [name] is not ill-appearing, toxic-appearing or diaphoretic.  HENT:     Head: Normocephalic and atraumatic.  Eyes:     General: No scleral icterus.       Right eye: No discharge.        Left eye: No discharge.     Extraocular Movements: Extraocular movements intact.     Conjunctiva/sclera: Conjunctivae normal.  Abdominal:     Comments: No overt abnormalities.  There is a 1 mm hypopigmented spot at about the 5 o'clock position.  No overt abnormalities or external hemorrhoids.  Exquisite tenderness on internal examination both posteriorly and anteriorly.  Normal rectal tone. Ulis Rias present throughout the examination  Skin:    General: Skin is warm and dry.  Neurological:     General: No focal deficit present.     Mental Status: [name] is alert. Mental status is at baseline.  Psychiatric:        Mood and Affect: Mood normal.        Behavior: Behavior normal.        Thought Content: Thought content normal.        Judgment: Judgment normal.        Assessment & Plan:

## 2019-09-25 NOTE — Assessment & Plan Note (Signed)
This problem is new.  She first noted this 1 month ago.  She states anytime she eats anything, she has to run to the bathroom and she has a liquid urgent bowel movement.  Although there is urgency, once she gets to the toilet, she has to sit for a fall and strain to get the liquid stool out.  She has had no fecal incontinence.  She is having 6-7 liquid stools per day.  There is no blood, melena, or abdominal pain.  She has lost 16 pounds since June (about 10 months) unintentionally.  She does not have a great appetite.  She is drinking water and staying hydrated.  The symptoms have occurred at night and also when she is fasting.  She has not taken any Imodium.  Her water is city water or she drinks bottled water.  She has not had any hiking or rural activities.  She states that she had antibiotics 2 months ago but cannot recall which antibiotic or what it was for.  Her friend died of C diff in October 03, 2018.  She has not had her gallbladder removed.   She had a CBC done in October 03, 2019 that showed a microcytic anemia with a hemoglobin of 10.6.  Her hemoglobin had been stable for almost 10 years.  Her ferritin also in February 2021 was 224 and her vitamin B12 426.  She had a screening colonoscopy in 2013-10-02 that was good prep and normal with a 10-year recall.  She had a negative HIV antibody screen in February 2021.  Her most recent TSH was on 10/02/2017.  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 The differential diagnosis is broad but I am going to start with the most likely etiologies including C. difficile, hyperthyroidism.  I think celiac disease is much less likely but I will do a TTG IgA.  I will also check a sed rate and CRP to assess for an inflammatory etiologies.  Will not pursue other stool studies at this time due to low pretest probability.  Plan: Stool for C. Difficile TSH IgA TTG CRP and sed rate She already has an appointment with GI in 2 weeks No Imodium until her C. difficile returns negative

## 2019-09-25 NOTE — Patient Instructions (Signed)
I will call you with the results Call the GI doctor to schedule a colonoscopy

## 2019-09-26 LAB — TSH: TSH: 0.536 u[IU]/mL (ref 0.450–4.500)

## 2019-09-26 LAB — C-REACTIVE PROTEIN: CRP: 5 mg/L (ref 0–10)

## 2019-09-26 LAB — T4, FREE: Free T4: 1.05 ng/dL (ref 0.82–1.77)

## 2019-09-26 LAB — SEDIMENTATION RATE: Sed Rate: 89 mm/hr — ABNORMAL HIGH (ref 0–40)

## 2019-09-26 LAB — TISSUE TRANSGLUTAMINASE, IGA: Transglutaminase IgA: <2 U/mL (ref 0–3)

## 2019-09-27 LAB — CLOSTRIDIUM DIFFICILE EIA: C difficile Toxins A+B, EIA: NEGATIVE

## 2019-09-29 ENCOUNTER — Encounter: Payer: Self-pay | Admitting: Internal Medicine

## 2019-09-29 ENCOUNTER — Other Ambulatory Visit (HOSPITAL_COMMUNITY)
Admission: RE | Admit: 2019-09-29 | Discharge: 2019-09-29 | Disposition: A | Discharge status: Home / Self Care | Payer: 59 | Source: Ambulatory Visit | Attending: Internal Medicine | Admitting: Internal Medicine

## 2019-09-29 DIAGNOSIS — Z01812 Encounter for preprocedural laboratory examination: Secondary | ICD-10-CM | POA: Diagnosis present

## 2019-09-29 DIAGNOSIS — Z20822 Contact with and (suspected) exposure to covid-19: Secondary | ICD-10-CM | POA: Insufficient documentation

## 2019-09-29 LAB — SARS CORONAVIRUS 2 (TAT 6-24 HRS): SARS Coronavirus 2: NEGATIVE

## 2019-09-30 NOTE — Addendum Note (Signed)
Addended by: Yvonna Alanis E on: 09/30/2019 07:00 PM   Modules accepted: Orders

## 2019-10-01 ENCOUNTER — Other Ambulatory Visit: Payer: Self-pay

## 2019-10-01 ENCOUNTER — Encounter (HOSPITAL_COMMUNITY): Payer: Self-pay | Admitting: Internal Medicine

## 2019-10-01 MED FILL — PREGABALIN 75 MG CAPS: 75 | 30 days supply | Qty: 90 | Fill #0

## 2019-10-01 NOTE — Progress Notes (Signed)
Patient denies shortness of breath, fever, cough and chest pain.  PCP - Dr Larey Dresser Cardiologist - denies  Chest x-ray - 08/04/19 EKG - 09/12/19 Stress Test - 07/11/13 ECHO - 04/18/07 Cardiac Cath - n/a  STOP now taking any Aspirin (unless otherwise instructed by your surgeon), Aleve, Naproxen, Ibuprofen, Motrin, Advil, Goody's, BC's, all herbal medications, fish oil, and all vitamins.   Coronavirus Screening Covid test on 09/29/19 was negative.  Patient verbalized understanding of instructions that were given via phone.

## 2019-10-01 NOTE — Anesthesia Preprocedure Evaluation (Addendum)
Anesthesia Evaluation  Patient identified by MRN, date of birth, ID band Patient awake    Reviewed: Allergy & Precautions, NPO status , Patient's Chart, lab work & pertinent test results  Airway Mallampati: II  TM Distance: >3 FB Neck ROM: Full    Dental no notable dental hx. (+) Chipped, Dental Advisory Given,    Pulmonary Current Smoker and Patient abstained from smoking.,    Pulmonary exam normal breath sounds clear to auscultation       Cardiovascular hypertension, Pt. on medications negative cardio ROS Normal cardiovascular exam Rhythm:Regular Rate:Normal     Neuro/Psych  Headaches, Anxiety CVA, No Residual Symptoms    GI/Hepatic Neg liver ROS, hiatal hernia, GERD  Medicated and Controlled,  Endo/Other  negative endocrine ROS  Renal/GU negative Renal ROS     Musculoskeletal  (+) Arthritis ,   Abdominal   Peds  Hematology  (+) anemia , Hgb 10.6   Anesthesia Other Findings   Reproductive/Obstetrics negative OB ROS                            Anesthesia Physical Anesthesia Plan  ASA: III  Anesthesia Plan: General   Post-op Pain Management:    Induction: Intravenous  PONV Risk Score and Plan: Treatment may vary due to age or medical condition, Ondansetron and Dexamethasone  Airway Management Planned: LMA  Additional Equipment: None  Intra-op Plan:   Post-operative Plan: Extubation in OR  Informed Consent: I have reviewed the patients History and Physical, chart, labs and discussed the procedure including the risks, benefits and alternatives for the proposed anesthesia with the patient or authorized representative who has indicated his/her understanding and acceptance.     Dental advisory given  Plan Discussed with: CRNA  Anesthesia Plan Comments:        Anesthesia Quick Evaluation

## 2019-10-02 ENCOUNTER — Ambulatory Visit (HOSPITAL_COMMUNITY): Payer: 59 | Admitting: Certified Registered"

## 2019-10-02 ENCOUNTER — Encounter (HOSPITAL_COMMUNITY)
Admission: RE | Disposition: A | Discharge status: Home / Self Care | Payer: Self-pay | Source: Home / Self Care | Attending: Internal Medicine

## 2019-10-02 ENCOUNTER — Ambulatory Visit (HOSPITAL_COMMUNITY)
Admission: RE | Admit: 2019-10-02 | Discharge: 2019-10-02 | Disposition: A | Discharge status: Home / Self Care | Payer: 59 | Attending: Internal Medicine | Admitting: Internal Medicine

## 2019-10-02 ENCOUNTER — Other Ambulatory Visit: Payer: Self-pay

## 2019-10-02 ENCOUNTER — Telehealth: Payer: Self-pay | Admitting: *Deleted

## 2019-10-02 ENCOUNTER — Encounter (HOSPITAL_COMMUNITY): Payer: Self-pay | Admitting: Internal Medicine

## 2019-10-02 ENCOUNTER — Ambulatory Visit (HOSPITAL_COMMUNITY)
Admission: RE | Admit: 2019-10-02 | Discharge: 2019-10-02 | Disposition: A | Discharge status: Home / Self Care | Payer: 59 | Source: Ambulatory Visit | Attending: Internal Medicine | Admitting: Internal Medicine

## 2019-10-02 DIAGNOSIS — M542 Cervicalgia: Secondary | ICD-10-CM | POA: Diagnosis present

## 2019-10-02 DIAGNOSIS — M5412 Radiculopathy, cervical region: Secondary | ICD-10-CM

## 2019-10-02 DIAGNOSIS — I10 Essential (primary) hypertension: Secondary | ICD-10-CM | POA: Diagnosis not present

## 2019-10-02 DIAGNOSIS — M4802 Spinal stenosis, cervical region: Secondary | ICD-10-CM | POA: Diagnosis not present

## 2019-10-02 DIAGNOSIS — F17200 Nicotine dependence, unspecified, uncomplicated: Secondary | ICD-10-CM | POA: Diagnosis not present

## 2019-10-02 DIAGNOSIS — Z8673 Personal history of transient ischemic attack (TIA), and cerebral infarction without residual deficits: Secondary | ICD-10-CM | POA: Insufficient documentation

## 2019-10-02 DIAGNOSIS — M1990 Unspecified osteoarthritis, unspecified site: Secondary | ICD-10-CM | POA: Diagnosis not present

## 2019-10-02 HISTORY — PX: RADIOLOGY WITH ANESTHESIA: SHX6223

## 2019-10-02 SURGERY — MRI WITH ANESTHESIA
Anesthesia: General

## 2019-10-02 MED ORDER — LIDOCAINE 2% (20 MG/ML) 5 ML SYRINGE
INTRAMUSCULAR | Status: DC | PRN
  Administered 2019-10-02: 100 mg via INTRAVENOUS

## 2019-10-02 MED ORDER — LACTATED RINGERS IV SOLN: INTRAVENOUS | Status: DC

## 2019-10-02 MED ORDER — PROPOFOL 10 MG/ML IV BOLUS
INTRAVENOUS | Status: DC | PRN
  Administered 2019-10-02: 150 mg via INTRAVENOUS
  Administered 2019-10-02: 250 mg via INTRAVENOUS

## 2019-10-02 MED ORDER — FENTANYL CITRATE (PF) 250 MCG/5ML IJ SOLN
INTRAMUSCULAR | Status: AC
  Filled 2019-10-02: qty 5

## 2019-10-02 MED ORDER — FENTANYL CITRATE (PF) 100 MCG/2ML IJ SOLN
INTRAMUSCULAR | Status: DC | PRN
  Administered 2019-10-02: 100 ug via INTRAVENOUS

## 2019-10-02 MED ORDER — SUCCINYLCHOLINE CHLORIDE 200 MG/10ML IV SOSY
PREFILLED_SYRINGE | INTRAVENOUS | Status: DC | PRN
  Administered 2019-10-02: 100 mg via INTRAVENOUS

## 2019-10-02 MED ORDER — ONDANSETRON HCL 4 MG/2ML IJ SOLN
INTRAMUSCULAR | Status: DC | PRN
  Administered 2019-10-02: 4 mg via INTRAVENOUS

## 2019-10-02 NOTE — Anesthesia Procedure Notes (Signed)
Procedure Name: Intubation Date/Time: 10/02/2019 10:31 AM Performed by: Babs Bertin, CRNA Pre-anesthesia Checklist: Patient identified, Emergency Drugs available, Suction available and Patient being monitored Patient Re-evaluated:Patient Re-evaluated prior to induction Oxygen Delivery Method: Circle System Utilized Preoxygenation: Pre-oxygenation with 100% oxygen Induction Type: IV induction Ventilation: Mask ventilation without difficulty Laryngoscope Size: Mac and 3 Grade View: Grade II Tube type: Oral Tube size: 7.0 mm Number of attempts: 1 Airway Equipment and Method: Stylet and Oral airway Placement Confirmation: ETT inserted through vocal cords under direct vision,  positive ETCO2 and breath sounds checked- equal and bilateral Secured at: 21 cm Tube secured with: Tape Dental Injury: Teeth and Oropharynx as per pre-operative assessment

## 2019-10-02 NOTE — Anesthesia Procedure Notes (Signed)
Procedure Name: LMA Insertion Date/Time: 10/02/2019 10:15 AM Performed by: Babs Bertin, CRNA Pre-anesthesia Checklist: Patient identified, Emergency Drugs available, Suction available and Patient being monitored Patient Re-evaluated:Patient Re-evaluated prior to induction Oxygen Delivery Method: Circle System Utilized Preoxygenation: Pre-oxygenation with 100% oxygen Induction Type: IV induction Ventilation: Mask ventilation without difficulty LMA: LMA with gastric port inserted LMA Size: 4.0 Number of attempts: 1 Airway Equipment and Method: Bite block Placement Confirmation: positive ETCO2 Tube secured with: Tape Dental Injury: Teeth and Oropharynx as per pre-operative assessment

## 2019-10-02 NOTE — Anesthesia Postprocedure Evaluation (Signed)
Anesthesia Post Note  Patient: [name]  Procedure(s) Performed: MRI WITH ANESTHESIA CERVICAL WITHOUT CONTRAST (N/A )     Patient location during evaluation: PACU Anesthesia Type: General Level of consciousness: awake and alert Pain management: pain level controlled Vital Signs Assessment: post-procedure vital signs reviewed and stable Respiratory status: spontaneous breathing, nonlabored ventilation, respiratory function stable and patient connected to nasal cannula oxygen Cardiovascular status: blood pressure returned to baseline and stable Postop Assessment: no apparent nausea or vomiting Anesthetic complications: no    Last Vitals:  Vitals:   10/02/19 1137 10/02/19 1140  BP: 138/89   Pulse: 86   Resp: 14   Temp: 36.8 C   SpO2: 100% 100%    Last Pain:  Vitals:   10/02/19 1140  TempSrc:   PainSc: 0-No pain                 Barnet Glasgow

## 2019-10-02 NOTE — Progress Notes (Signed)
Patient arrived to short stay for MRI today with elevated BP.  Dr. Valma Cava made aware.  No new orders received.

## 2019-10-02 NOTE — Telephone Encounter (Signed)
Fax information from [facility] for PA for Pregabalin 75 mg Capsules  -approved 10/01/2019 thru 09/30/2020.  Sander Nephew, RN 10:41 AM.

## 2019-10-02 NOTE — Transfer of Care (Signed)
Immediate Anesthesia Transfer of Care Note  Patient: [name]  Procedure(s) Performed: MRI WITH ANESTHESIA CERVICAL WITHOUT CONTRAST (N/A )  Patient Location: PACU  Anesthesia Type:General  Level of Consciousness: awake, alert  and oriented  Airway & Oxygen Therapy: Patient Spontanous Breathing  Post-op Assessment: Report given to RN and Post -op Vital signs reviewed and stable  Post vital signs: Reviewed and stable  Last Vitals:  Vitals Value Taken Time  BP 138/96 10/02/19 1122  Temp 98.3   Pulse 87 10/02/19 1123  Resp 18 10/02/19 1123  SpO2 98 % 10/02/19 1123  Vitals shown include unvalidated device data.  Last Pain:  Vitals:   10/02/19 0746  TempSrc:   PainSc: 8          Complications: No apparent anesthesia complications

## 2019-10-03 ENCOUNTER — Encounter: Payer: Self-pay | Admitting: *Deleted

## 2019-10-05 ENCOUNTER — Telehealth: Payer: Self-pay | Admitting: Internal Medicine

## 2019-10-05 NOTE — Telephone Encounter (Signed)
   Reason for call:   I received a call from Ms. [name] at 1: PM indicating for her hemorrhoid comes out bowel movement.   Pertinent Data:   Her hemorrhoid comes out with each bowel movement and she needs to push it back. Has non bloody diarrhea (chrobnic). Reports some nausea. No vomiting. No fever or chills. No pain, no bleeding.    Assessment / Plan / Recommendations:    No evidence of strangulation, no major bleeding or severe pain.  Recommended to continue current treatment and follow-up in [facility] or her surgeon office.  She agrees with plan.  As always, pt is advised that if symptoms worsen or new symptoms arise, they should go to an urgent care facility or to to ER for further evaluation.   Dewayne Hatch, MD   10/05/2019, 2:21 PM

## 2019-10-06 ENCOUNTER — Other Ambulatory Visit: Payer: Self-pay

## 2019-10-06 ENCOUNTER — Ambulatory Visit (INDEPENDENT_AMBULATORY_CARE_PROVIDER_SITE_OTHER): Payer: 59 | Admitting: Internal Medicine

## 2019-10-06 VITALS — BP 166/94 | HR 81 | Temp 98.6°F | Ht 59.0 in | Wt 126.0 lb

## 2019-10-06 DIAGNOSIS — K921 Melena: Secondary | ICD-10-CM | POA: Diagnosis not present

## 2019-10-06 DIAGNOSIS — K644 Residual hemorrhoidal skin tags: Secondary | ICD-10-CM | POA: Diagnosis not present

## 2019-10-06 DIAGNOSIS — R197 Diarrhea, unspecified: Secondary | ICD-10-CM | POA: Diagnosis not present

## 2019-10-06 NOTE — Progress Notes (Signed)
   CC: hemorrhoids  HPI:  [name] is a 59 y.o. F with significant PMH as outlined below, who presents for follow-up of her hemorrhoids. Please see problem based charting for additional information.   Past Medical History:  Diagnosis Date  . Anemia   . Arthritis    "neck, hips" (04/25/2018)  . Claustrophobia   . Daily headache   . GERD (gastroesophageal reflux disease)   . Hiatal hernia   . Hyperlipidemia    "hx" (04/25/2018)  . Hypertension   . Plantar fasciitis    hx - left foot  . Stroke [facility]) 05/2014   "mini stroke" (04/25/2018)   Review of Systems:  Review of Systems  Constitutional: Negative for chills and fever.  Respiratory: Negative for cough and shortness of breath.   Cardiovascular: Negative for chest pain and palpitations.  Gastrointestinal: Positive for blood in stool and diarrhea. Negative for abdominal pain, constipation, melena, nausea and vomiting.  Genitourinary: Negative for dysuria, frequency and urgency.  Musculoskeletal: Negative for back pain.  Skin: Negative for itching and rash.   Physical Exam:  Vitals:   10/06/19 1312 10/06/19 1313  BP:  (!) 166/94  Pulse:  81  Temp:  98.6 F (37 C)  TempSrc:  Oral  SpO2:  100%  Weight: 126 lb (57.2 kg)   Height: 4\' 11"  (1.499 m)    Physical Exam Vitals and nursing note reviewed.  Constitutional:      General: She is not in acute distress.    Appearance: Normal appearance. She is not ill-appearing.  Pulmonary:     Effort: Pulmonary effort is normal.  Genitourinary:    Rectum: Tenderness (Pain on DRE) present. No mass, anal fissure or external hemorrhoid. Normal anal tone.     Comments: Skin tags present outside the anus. No overt abnormalities. No evidence of bleeding. Skin:    General: Skin is warm and dry.  Neurological:     Mental Status: She is alert.    Assessment & Plan:   See Encounters Tab for problem based charting.  Patient discussed with Dr. Lynnae January

## 2019-10-06 NOTE — Patient Instructions (Addendum)
Ms. Trojan,  It was nice meeting you today! I am sorry you are having such pain from your hemorrhoids. On exam today, there were no hemorrhoids protruding down or breaks in the skin. Please continue to use topical NGL-lidocaine and hydrocortisone cream in addition to sitz baths. Remember your follow-up appointment with GI on Friday at [facility].  Thank you for letting us be a part of your care!

## 2019-10-08 ENCOUNTER — Other Ambulatory Visit: Payer: Self-pay

## 2019-10-08 ENCOUNTER — Encounter: Payer: Self-pay | Admitting: Internal Medicine

## 2019-10-08 ENCOUNTER — Ambulatory Visit (INDEPENDENT_AMBULATORY_CARE_PROVIDER_SITE_OTHER): Payer: 59 | Admitting: Internal Medicine

## 2019-10-08 DIAGNOSIS — M5412 Radiculopathy, cervical region: Secondary | ICD-10-CM

## 2019-10-08 NOTE — Assessment & Plan Note (Signed)
This problem is chronic and uncontrolled.  Her symptoms have not changed.  She was able to get the pregabalin on the ninth so has been taking it about 5 days.  It makes her loopy during the day and so she is only taking it at night.  The pain is unchanged with the pregabalin.  The pain is affecting her sleep now.  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  She requested a work note to excuse her from work on those days.  She would like to see a surgeon to discuss either injections, although these have not worked in the past, or surgery.  She liked the orthopedic surgeon Dr. Lorre Nick that she saw in the fall.  I do not know if Dr. Lorre Nick will address cervical radiculitis and if not, I will then refer to neurosurgery.  PLAN : Continue pregabalin Referral to Dr. Lorre Nick orthopedics Mail her a work excuse

## 2019-10-08 NOTE — Progress Notes (Signed)
   Subjective:    Patient ID: [name], female    DOB: 1960-11-23, 59 y.o.   MRN: [id number]  HPI  TELEHEALTH - AUDIO only   This is a telephone encounter between [employer] and Larey Dresser on 10/08/2019 for review of MRI results. The visit was conducted with the patient located at home and Larey Dresser at [facility]. The patient's identity was confirmed using their DOB and current address. The patient has consented to being evaluated through a telephone encounter and understands the associated risks (an examination cannot be done and the patient may need to come in for an appointment) / benefits (allows the patient to remain at home, decreasing exposure to coronavirus). I personally spent 8 minutes on medical discussion.   Please see the A&P for the status of the pt's chronic medical problems.  ROS : per ROS section and in problem oriented charting. All other systems are negative.  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  Her GI appointment to address weight loss, diarrhea, and hemorrhoids is the 16th, in 2 days  Review of Systems Right upper extremity pain and numbness that has not changed It is now affecting her sleep    Objective:   Physical Exam  Telephone visit only    Assessment & Plan:

## 2019-10-08 NOTE — Assessment & Plan Note (Signed)
Pt is following up today for her external hemorrhoids. States she began having rectal pain over the weekend and believed she felt prolapsed hemorrhoids outside the anus. She called the clinic after hours line and was instructed to continue topical treatment regimen and follow-up with GI or [facility] during the week. Today pt states her pain has improved as she is now able to tolerate sitting for longer periods of time. She thinks the hemorrhoids are no longer prolapsed as she can no longer feel them with each bowel movement. Does endorse chronic diarrhea, having bowel movements 6-7 times per day. Using Imodium daily. Occasionally sees red blood coating the stool, but not large volume or coating the toilet bowl. On exam today, pt is without overt abnormalities. No prolapsed or external hemorrhoids, no anal fissures, and no masses. Pain with DRE and normal rectal tone. She has an appointment scheduled with GI on Friday.  - instructed pt to continue management with Sitz baths, preparation H, and topical nitroglycerin/lidocaine for continued pain relief - GI follow-up on 4/16

## 2019-10-10 ENCOUNTER — Encounter: Payer: Self-pay | Admitting: Gastroenterology

## 2019-10-10 ENCOUNTER — Other Ambulatory Visit (INDEPENDENT_AMBULATORY_CARE_PROVIDER_SITE_OTHER): Payer: 59

## 2019-10-10 ENCOUNTER — Ambulatory Visit (INDEPENDENT_AMBULATORY_CARE_PROVIDER_SITE_OTHER): Payer: 59 | Admitting: Gastroenterology

## 2019-10-10 VITALS — BP 136/82 | HR 84 | Temp 98.3°F | Ht 58.5 in | Wt 123.4 lb

## 2019-10-10 DIAGNOSIS — K625 Hemorrhage of anus and rectum: Secondary | ICD-10-CM

## 2019-10-10 DIAGNOSIS — K529 Noninfective gastroenteritis and colitis, unspecified: Secondary | ICD-10-CM

## 2019-10-10 DIAGNOSIS — D509 Iron deficiency anemia, unspecified: Secondary | ICD-10-CM

## 2019-10-10 DIAGNOSIS — K602 Anal fissure, unspecified: Secondary | ICD-10-CM

## 2019-10-10 DIAGNOSIS — K648 Other hemorrhoids: Secondary | ICD-10-CM | POA: Diagnosis not present

## 2019-10-10 DIAGNOSIS — K6289 Other specified diseases of anus and rectum: Secondary | ICD-10-CM

## 2019-10-10 DIAGNOSIS — R110 Nausea: Secondary | ICD-10-CM

## 2019-10-10 LAB — IBC + FERRITIN
Ferritin: 79.4 ng/mL (ref 10.0–291.0)
Iron: 31 ug/dL — ABNORMAL LOW (ref 42–145)
Saturation Ratios: 8.4 % — ABNORMAL LOW (ref 20.0–50.0)
Transferrin: 265 mg/dL (ref 212.0–360.0)

## 2019-10-10 LAB — VITAMIN B12: Vitamin B-12: 241 pg/mL (ref 211–911)

## 2019-10-10 LAB — FOLATE: Folate: 10.5 ng/mL (ref >5.9)

## 2019-10-10 LAB — IGA: IgA: 273 mg/dL (ref 68–378)

## 2019-10-10 MED ORDER — NA SULFATE-K SULFATE-MG SULF 17.5-3.13-1.6 GM/177ML PO SOLN: 1.0000 | Freq: Once | ORAL | 0 refills | Status: AC

## 2019-10-10 MED FILL — SUPREP BOWEL PREP KIT: 17.5-3.13-1 | 1 days supply | Qty: 354 | Fill #0

## 2019-10-10 NOTE — Progress Notes (Signed)
10/10/2019 [name] [id number] Oct 21, 1960   HISTORY OF PRESENT ILLNESS: This is a 59 year old female who is a patient of Dr. Blanch Media.  Has been seen here in the past for complaints of rectal bleeding and rectal pain related to anal fissures and internal hemorrhoids.  She presents here today with complaints of the same.  For the last week she had a lot of problems with her internal hemorrhoids prolapsing.  Has been having a lot of rectal pain and some bleeding as well.  She has been using nitro gel with lidocaine as well as hydrocortisone to be applied internally to the hemorrhoids.  The hemorrhoids have reduced at this point.  She also complains of chronic loose stools.  Says she has had loose stools for over 2 years.  A lot of times avoids eating so that she does not have to have a bowel movement.  Reports getting up at night for bowel movements at times as well.  PCP performed extensive lab evaluation including CBC, BMP, TSH, sed rate, CRP, celiac labs.  C. difficile stool study was also performed and was negative.  CRP was normal, but sed rate was quite elevated at 89.  Hemoglobin is in the 10 g range has been there for at least the past year.  MCV is low at 74.  She reports frequent nausea as well.  She is on omeprazole 40 mg daily for acid reflux related issues  Last colonoscopy October 2015 was normal with repeat recommended at a 10-year interval.   Past Medical History:  Diagnosis Date  . Anemia   . Arthritis    "neck, hips" (04/25/2018)  . Claustrophobia   . Daily headache   . GERD (gastroesophageal reflux disease)   . Hiatal hernia   . Hyperlipidemia    "hx" (04/25/2018)  . Hypertension   . Plantar fasciitis    hx - left foot  . Stroke [facility]) 05/2014   "mini stroke" (04/25/2018)   Past Surgical History:  Procedure Laterality Date  . ABDOMINAL HYSTERECTOMY  12/09   Secondary to fibroids  . ARTERY BIOPSY Right 04/10/2018   Procedure: BIOPSY TEMPORAL ARTERY;   Surgeon: Rosetta Posner, MD;  Location: [facility];  Service: Vascular;  Laterality: Right;  . [place]; 1988  . COLONOSCOPY    . FOOT SURGERY Left 03/2017   Bone Spurs Removed  . RADIOLOGY WITH ANESTHESIA N/A 04/26/2018   Procedure: MRI WITH ANESTHESIA;  Surgeon: Radiologist, Medication, MD;  Location: [facility];  Service: Radiology;  Laterality: N/A;  . RADIOLOGY WITH ANESTHESIA N/A 08/24/2019   Procedure: MRI WITH ANESTHESIA CERVICAL SPINE;  Surgeon: Radiologist, Medication, MD;  Location: [facility];  Service: Radiology;  Laterality: N/A;  . RADIOLOGY WITH ANESTHESIA N/A 10/02/2019   Procedure: MRI WITH ANESTHESIA CERVICAL WITHOUT CONTRAST;  Surgeon: Radiologist, Medication, MD;  Location: [facility];  Service: Radiology;  Laterality: N/A;  . [place]    reports that she has been smoking cigarettes. She has a 9.50 pack-year smoking history. She has never used smokeless tobacco. She reports current alcohol use of about 4.0 standard drinks of alcohol per week. She reports previous drug use. family history includes Cancer in an other family member; Cancer (age of onset: 65) in her maternal aunt; Cancer (age of onset: 51) in her mother; Cancer (age of onset: 42) in her father; Colon cancer in her maternal uncle and paternal uncle; Heart disease in her mother and sister; Pancreatic cancer in  her mother. Allergies  Allergen Reactions  . Aspirin Hives  . Hydromorphone Hcl Hives and Nausea And Vomiting      Outpatient Encounter Medications as of 10/10/2019  Medication Sig  . acetaminophen (TYLENOL) 500 MG tablet Take 1,000 mg by mouth every 6 (six) hours as needed for moderate pain or headache.  . AMBULATORY NON FORMULARY MEDICATION Medication Name: nitroglycerin 0.125 mg ointment mixed with 2% lidocaine three times a day for 6-8 weeks  . amLODipine (NORVASC) 2.5 MG tablet TAKE 1 TABLET BY MOUTH DAILY. (Patient taking differently: Take 2.5 mg by mouth daily. )  . ferrous sulfate (IRON  SUPPLEMENT) 325 (65 FE) MG tablet Take 1 tablet (325 mg total) by mouth daily with breakfast.  . HYDROcodone-acetaminophen (NORCO/VICODIN) 5-325 MG tablet Take 1 tablet by mouth at bedtime as needed. Take tylenol 1000 mg and ibuprofen 600 mg with food three times a day for 2 weeks.  . hydrocortisone (ANUSOL-HC) 2.5 % rectal cream Place 1 application rectally 2 (two) times daily. (Patient taking differently: Place 1 application rectally 2 (two) times daily as needed for hemorrhoids. )  . lisinopril (ZESTRIL) 20 MG tablet TAKE 1 TABLET BY MOUTH DAILY. (Patient taking differently: Take 20 mg by mouth daily. )  . omeprazole (PRILOSEC) 40 MG capsule TAKE 1 CAPSULE BY MOUTH DAILY. (Patient taking differently: Take 40 mg by mouth daily. )  . pramoxine (PROCTOFOAM) 1 % foam Place 1 application rectally 3 (three) times daily as needed for anal itching.  . pregabalin (LYRICA) 75 MG capsule Take 1 capsule (75 mg total) by mouth 3 (three) times daily.  . [DISCONTINUED] phenylephrine-shark liver oil-mineral oil-petrolatum (PREPARATION H) 0.25-3-14-71.9 % rectal ointment Place 1 application rectally 2 (two) times daily as needed for hemorrhoids. (Patient not taking: Reported on 08/22/2019)  . [DISCONTINUED] polycarbophil (FIBERCON) 625 MG tablet Take 1 tablet (625 mg total) by mouth daily. (Patient not taking: Reported on 08/04/2019)   No facility-administered encounter medications on file as of 10/10/2019.     REVIEW OF SYSTEMS  : All other systems reviewed and negative except where noted in the History of Present Illness.   PHYSICAL EXAM: BP 136/82 (BP Location: Left Arm, Patient Position: Sitting, Cuff Size: Normal)   Pulse 84   Temp 98.3 F (36.8 C)   Ht 4' 10.5" (1.486 m) Comment: height measured without shoes  Wt 123 lb 6 oz (56 kg)   LMP  (LMP Unknown)   BMI 25.35 kg/m  General: Well developed AA female in no acute distress Head: Normocephalic and atraumatic Eyes:  Sclerae anicteric, conjunctiva  pink. Ears: Normal auditory acuity Lungs: Clear throughout to auscultation; no W/R/R. Heart: Regular rate and rhythm.  No M/R/G. Abdomen: Soft, non-distended.  BS present.  Non-tender. Rectal:  No external abnormalities.  DRE very tender even with separation of perianal tissue and then mostly posteriorly.  Not completed due to pain. Musculoskeletal: Symmetrical with no gross deformities  Skin: No lesions on visible extremities Extremities: No edema  Neurological: Alert oriented x 4, grossly non-focal Psychological:  Alert and cooperative. Normal mood and affect  ASSESSMENT AND PLAN: *Chronic loose stools: Other laboratory evaluation negative including celiac studies.  We will plan for repeat colonoscopy possibly random colon biopsies to rule out microscopic/collagenous colitis.  We will schedule with Dr. Henrene Pastor. *Microcytic anemia: Hemoglobin hanging in the 10 g range, but MCV is low at 73.  Suspect iron deficiency.  Will check iron studies, B12, and folate levels.  We will plan for EGD as  well. *Rectal pain and rectal bleeding: Has history of anal fissure and internal hemorrhoids.  Very tender on exam today.  She describes prolapsing internal hemorrhoids at times as well.  Exam today not completed due to pain.  Suspect recurrence of the same.  Using nitro gel with lidocaine, but has only been apply externally.  I advised that she needs to get internally to the first knuckle.  May need to consider internal hemorrhoid banding at some point if they appears amenable to that after repeat colonoscopy exam.  Continue treatment with the nitro gel as directed above and hydrocortisone suppositories as needed for internal hemorrhoids as well. *Nausea and GERD:  On omeprazole 40 mg daily.  Will continue for now.  Will plan for EGD as stated above.  **The risks, benefits, and alternatives to EGD and colonoscopy were discussed with the patient and she consents to proceed.    CC:  Bartholomew Crews,  MD

## 2019-10-10 NOTE — Patient Instructions (Addendum)
If you are age 58 or older, your body mass index should be between 23-30. Your Body mass index is 25.35 kg/m. If this is out of the aforementioned range listed, please consider follow up with your Primary Care Provider.  If you are age 100 or younger, your body mass index should be between 19-25. Your Body mass index is 25.35 kg/m. If this is out of the aformentioned range listed, please consider follow up with your Primary Care Provider.   Your provider has requested that you go to the basement level for lab work before leaving today. Press "B" on the elevator. The lab is located at the first door on the left as you exit the elevator.  You have been scheduled for an endoscopy and colonoscopy. Please follow the written instructions given to you at your visit today. Please pick up your prep supplies at the pharmacy within the next 1-3 days. If you use inhalers (even only as needed), please bring them with you on the day of your procedure.

## 2019-10-10 NOTE — Progress Notes (Signed)
Assessment and plans reviewed  

## 2019-10-13 MED FILL — AMLODIPINE 2.5 MG TABLET: 2.5 | 30 days supply | Qty: 30 | Fill #3

## 2019-10-13 MED FILL — LISINOPRIL 20 MG TABLET: 20 | 30 days supply | Qty: 30 | Fill #3

## 2019-10-13 NOTE — Progress Notes (Signed)
Internal Medicine Clinic Attending  Case discussed with Dr. Jones at the time of the visit.  We reviewed the resident's history and exam and pertinent patient test results.  I agree with the assessment, diagnosis, and plan of care documented in the resident's note.  

## 2019-10-16 ENCOUNTER — Encounter (HOSPITAL_COMMUNITY): Payer: Self-pay | Admitting: Emergency Medicine

## 2019-10-16 ENCOUNTER — Emergency Department (HOSPITAL_COMMUNITY): Payer: 59

## 2019-10-16 ENCOUNTER — Other Ambulatory Visit: Payer: Self-pay

## 2019-10-16 ENCOUNTER — Emergency Department (HOSPITAL_COMMUNITY)
Admission: EM | Admit: 2019-10-16 | Discharge: 2019-10-16 | Disposition: A | Discharge status: Home / Self Care | Payer: 59 | Attending: Emergency Medicine | Admitting: Emergency Medicine

## 2019-10-16 DIAGNOSIS — F17210 Nicotine dependence, cigarettes, uncomplicated: Secondary | ICD-10-CM | POA: Diagnosis not present

## 2019-10-16 DIAGNOSIS — R0789 Other chest pain: Secondary | ICD-10-CM | POA: Insufficient documentation

## 2019-10-16 DIAGNOSIS — Z8673 Personal history of transient ischemic attack (TIA), and cerebral infarction without residual deficits: Secondary | ICD-10-CM | POA: Diagnosis not present

## 2019-10-16 DIAGNOSIS — I10 Essential (primary) hypertension: Secondary | ICD-10-CM | POA: Insufficient documentation

## 2019-10-16 DIAGNOSIS — Z79899 Other long term (current) drug therapy: Secondary | ICD-10-CM | POA: Diagnosis not present

## 2019-10-16 MED ORDER — ACETAMINOPHEN 500 MG PO TABS: 500.0000 mg | ORAL_TABLET | Freq: Four times a day (QID) | ORAL | 0 refills | Status: DC | PRN

## 2019-10-16 NOTE — ED Triage Notes (Signed)
Pt c/o left rib pain x 3 days. 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

## 2019-10-16 NOTE — Discharge Instructions (Signed)
Your history and physical exam is consistent with anterior chest wall pain, likely costochondritis versus musculoskeletal strain.  Please abstain from work and movements that precipitate your symptoms of discomfort.  You may take Tylenol or NSAIDs as needed for your pain.  Continue to apply the topical patches.  Return to the ED or seek immediate medical attention should you experience any new or worsening symptoms.

## 2019-10-16 NOTE — ED Provider Notes (Signed)
[facility] DEPT Provider Note   CSN: [id number] Arrival date & time: 10/16/19  [id number]     History Chief Complaint  Patient presents with  . Chest Pain    [name] is a 59 y.o. female with PMH of HTN, HLD, TIA, and arthritis who presents to the ED with a 3-day history of left-sided rib discomfort.  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  She states that she does not remember any specific inciting injury, but she has noticed continued discomfort with torso rotation, particularly with left-sided rotation.  She denies any recent illness, fevers or chills, recent cough, exertional chest pain, shortness of breath, pleuritic cough or pleuritic chest pain, abdominal pain, nausea or vomiting, history of clots, history of PUD, urinary symptoms, or other symptoms.  She applied topical pain patches, but has not taken anything by mouth for her symptoms of discomfort.  HPI     Past Medical History:  Diagnosis Date  . Anemia   . Arthritis    "neck, hips" (04/25/2018)  . Claustrophobia   . Daily headache   . GERD (gastroesophageal reflux disease)   . Hiatal hernia   . Hyperlipidemia    "hx" (04/25/2018)  . Hypertension   . Plantar fasciitis    hx - left foot  . Stroke [facility]) 05/2014   "mini stroke" (04/25/2018)    Patient Active Problem List   Diagnosis Date Noted  . Rectal bleeding 10/10/2019  . Chronic diarrhea 10/10/2019  . Rectal pain 10/10/2019  . Internal hemorrhoids 10/10/2019  . Anal fissure 10/10/2019  . Nausea without vomiting 10/10/2019  . External hemorrhoids 10/06/2019  . Numbness and tingling 08/22/2019  . Pain, dental 02/17/2019  . Diarrhea 09/14/2017  . H/O TIA (transient ischemic attack) and stroke 06/12/2014  . Cervical radiculitis 02/12/2014  . Healthcare maintenance 04/30/2013  . Essential hypertension, benign 01/24/2010  . Microcytic anemia 01/11/2010  . Hyperlipemia 05/28/2007  .  TOBACCO ABUSE 05/28/2007  . GERD 05/28/2007    Past Surgical History:  Procedure Laterality Date  . ABDOMINAL HYSTERECTOMY  12/09   Secondary to fibroids  . ARTERY BIOPSY Right 04/10/2018   Procedure: BIOPSY TEMPORAL ARTERY;  Surgeon: Rosetta Posner, MD;  Location: [facility];  Service: Vascular;  Laterality: Right;  . [place]; 1988  . COLONOSCOPY    . FOOT SURGERY Left 03/2017   Bone Spurs Removed  . RADIOLOGY WITH ANESTHESIA N/A 04/26/2018   Procedure: MRI WITH ANESTHESIA;  Surgeon: Radiologist, Medication, MD;  Location: [facility];  Service: Radiology;  Laterality: N/A;  . RADIOLOGY WITH ANESTHESIA N/A 08/24/2019   Procedure: MRI WITH ANESTHESIA CERVICAL SPINE;  Surgeon: Radiologist, Medication, MD;  Location: [facility];  Service: Radiology;  Laterality: N/A;  . RADIOLOGY WITH ANESTHESIA N/A 10/02/2019   Procedure: MRI WITH ANESTHESIA CERVICAL WITHOUT CONTRAST;  Surgeon: Radiologist, Medication, MD;  Location: [facility];  Service: Radiology;  Laterality: N/A;  . TUBAL LIGATION  1988     OB History   No obstetric history on file.     Family History  Problem Relation Age of Onset  . Cancer Mother 20       pancreatic cancer  . Pancreatic cancer Mother   . Heart disease Mother   . Cancer Father 44       throat cancer  . Cancer Maternal Aunt 60       breast cancer  . Cancer Other  Died  from Colon cancer in 59's  . Colon cancer Maternal Uncle   . Colon cancer Paternal Uncle   . Heart disease Sister     Social History   Tobacco Use  . Smoking status: Current Some Day Smoker    Packs/day: 0.25    Years: 38.00    Pack years: 9.50    Types: Cigarettes  . Smokeless tobacco: Never Used  . Tobacco comment: 0-4 cigs/day   Substance Use Topics  . Alcohol use: Yes    Alcohol/week: 4.0 standard drinks    Types: 4 Cans of beer per week  . Drug use: Not Currently    Home Medications Prior to Admission medications   Medication Sig Start Date End Date Taking? Authorizing  Provider  acetaminophen (TYLENOL) 500 MG tablet Take 1 tablet (500 mg total) by mouth every 6 (six) hours as needed for moderate pain. 10/16/19   Corena Herter, PA-C  AMBULATORY NON FORMULARY MEDICATION Medication Name: nitroglycerin 0.125 mg ointment mixed with 2% lidocaine three times a day for 6-8 weeks 05/09/18   Bartholomew Crews, MD  amLODipine (NORVASC) 2.5 MG tablet TAKE 1 TABLET BY MOUTH DAILY. Patient taking differently: Take 2.5 mg by mouth daily.  07/02/19   Bartholomew Crews, MD  ferrous sulfate (IRON SUPPLEMENT) 325 (65 FE) MG tablet Take 1 tablet (325 mg total) by mouth daily with breakfast. 03/01/16   Bartholomew Crews, MD  HYDROcodone-acetaminophen (NORCO/VICODIN) 5-325 MG tablet Take 1 tablet by mouth at bedtime as needed. Take tylenol 1000 mg and ibuprofen 600 mg with food three times a day for 2 weeks. 09/18/19   Bartholomew Crews, MD  hydrocortisone (ANUSOL-HC) 2.5 % rectal cream Place 1 application rectally 2 (two) times daily. Patient taking differently: Place 1 application rectally 2 (two) times daily as needed for hemorrhoids.  05/09/18   Bartholomew Crews, MD  lisinopril (ZESTRIL) 20 MG tablet TAKE 1 TABLET BY MOUTH DAILY. Patient taking differently: Take 20 mg by mouth daily.  07/02/19   Bartholomew Crews, MD  omeprazole (PRILOSEC) 40 MG capsule TAKE 1 CAPSULE BY MOUTH DAILY. Patient taking differently: Take 40 mg by mouth daily.  05/26/19   Bartholomew Crews, MD  pramoxine (PROCTOFOAM) 1 % foam Place 1 application rectally 3 (three) times daily as needed for anal itching. 11/25/18   [place]om[place], Carley D, DO  pregabalin (LYRICA) 75 MG capsule Take 1 capsule (75 mg total) by mouth 3 (three) times daily. 09/18/19 09/17/20  Bartholomew Crews, MD    Allergies    Aspirin and Hydromorphone hcl  Review of Systems   Review of Systems  Physical Exam Updated Vital Signs BP (!) 153/105 (BP Location: Left Arm)   Pulse 81   Temp 98.8 F (37.1 C) (Oral)   Resp  17   LMP  (LMP Unknown)   SpO2 100%   Physical Exam  ED Results / Procedures / Treatments   Labs (all labs ordered are listed, but only abnormal results are displayed) Labs Reviewed - No data to display  EKG None  Radiology DG Ribs Unilateral W/Chest Left  Result Date: 10/16/2019 CLINICAL DATA:  58 year old presenting with LEFT LOWER ANTERIOR rib pain over the past 3 days, worse with motion. EXAM: LEFT RIBS AND CHEST - 3+ VIEW COMPARISON:  Chest x-ray 08/04/2019 and earlier. FINDINGS: The site of maximum pain and tenderness was marked with a metallic BB. No fractures identified involving the LEFT ribs. No intrinsic osseous abnormalities. Cardiac silhouette  normal in size, unchanged. Thoracic aorta minimally tortuous, unchanged. Hilar and mediastinal contours otherwise unremarkable. Lungs clear. Bronchovascular markings normal. Pulmonary vascularity normal. No visible pleural effusions. No pneumothorax. IMPRESSION: 1. Normal LEFT ribs. 2. No acute cardiopulmonary disease. Electronically Signed   By: Evangeline Dakin M.D.   On: 10/16/2019 10:47    Procedures Procedures (including critical care time)  Medications Ordered in ED Medications - No data to display  ED Course  I have reviewed the triage vital signs and the nursing notes.  Pertinent labs & imaging results that were available during my care of the patient were reviewed by me and considered in my medical decision making (see chart for details).    MDM Rules/Calculators/A&P                      Patient's history and physical exam is consistent with costochondritis versus MSK strain.  While patient has history of HTN and HLD, she denies any exertional chest discomfort or shortness of breath symptoms.  No nausea.  Symptoms are readily reproducible with torso rotation in left direction as well as with palpation focally over ribs 6-7 on anterior chest wall.  Low suspicion for ACS and do not feel as though EKG necessary.  Obtained  plain films of left-sided ribs with chest which demonstrated no osseous abnormalities or acute cardiopulmonary findings.  Patient is resting comfortably on my physical exam.  Her vital signs are stable with only a mildly elevated blood pressure.  She denies any pleuritic chest discomfort, pleuritic cough, cough, chest pain, extremity swelling, history of clots, or other symptoms concerning for PE.  Wells' Criteria for PE is negative.  Do not feel as though additional lab work or imaging is warranted.  We will treat her symptoms with NSAIDs and Tylenol.  Strict ED return precautions discussed with patient.  She plans to follow-up with her primary care provider regarding today's encounter.  All of the evaluation and work-up results were discussed with the patient and any family at bedside. They were provided opportunity to ask any additional questions and have none at this time. They have expressed understanding of verbal discharge instructions as well as return precautions and are agreeable to the plan.    Final Clinical Impression(s) / ED Diagnoses Final diagnoses:  Left-sided chest wall pain    Rx / DC Orders ED Discharge Orders         Ordered    acetaminophen (TYLENOL) 500 MG tablet  Every 6 hours PRN     10/16/19 1225           Corena Herter, PA-C 10/16/19 Lynwood, Ankit, MD 10/17/19 1515

## 2019-10-21 ENCOUNTER — Ambulatory Visit (INDEPENDENT_AMBULATORY_CARE_PROVIDER_SITE_OTHER): Payer: 59 | Admitting: Orthopaedic Surgery

## 2019-10-21 ENCOUNTER — Ambulatory Visit (INDEPENDENT_AMBULATORY_CARE_PROVIDER_SITE_OTHER): Payer: 59

## 2019-10-21 ENCOUNTER — Encounter: Payer: Self-pay | Admitting: Orthopaedic Surgery

## 2019-10-21 ENCOUNTER — Other Ambulatory Visit: Payer: Self-pay

## 2019-10-21 VITALS — BP 132/86 | HR 79 | Ht 58.5 in | Wt 123.0 lb

## 2019-10-21 DIAGNOSIS — M5412 Radiculopathy, cervical region: Secondary | ICD-10-CM | POA: Diagnosis not present

## 2019-10-21 DIAGNOSIS — R202 Paresthesia of skin: Secondary | ICD-10-CM

## 2019-10-21 DIAGNOSIS — M542 Cervicalgia: Secondary | ICD-10-CM | POA: Diagnosis not present

## 2019-10-21 DIAGNOSIS — R200 Anesthesia of skin: Secondary | ICD-10-CM

## 2019-10-21 NOTE — Progress Notes (Signed)
Office Visit Note   Patient: [name]           Date of Birth: 11-Jun-1961           MRN: [id number] Visit Date: 10/21/2019              Requested by: Bartholomew Crews, MD [id number] [place],  [place] [address] PCP: Bartholomew Crews, MD   Assessment & Plan: Visit Diagnoses:  1. Neck pain   2. Cervical radiculitis   3. Numbness and tingling in right hand     Plan: Some patient symptoms may be related to carpal tunnel and some do neuroforaminal narrowing at C6-7.  I recommend proceeding with nerve conduction velocities EMGs upper extremity to better delineate the cause of her problem.  She states that severe enough she wants to consider proceeding with operative intervention since it bothers her daily when she works.  Follow-up after test for review.  We discussed some of the possible options for treatment including carpal tunnel release only versus combined procedure.  Follow-Up Instructions: after electrical tests.   Orders:  Orders Placed This Encounter  Procedures  . XR Cervical Spine 2 or 3 views   No orders of the defined types were placed in this encounter.     Procedures: No procedures performed   Clinical Data: No additional findings.   Subjective: Chief Complaint  Patient presents with  . Neck - Pain    HPI 59 year old female with neck and right arm pain that syndrome present for several months she has been taking hydrocodone and Lyrica.  Attempted MRI was unsuccessful with sedation and she had MRI scan with general anesthesia cervical spine which is available for review.  Comparison MRI scan 2015 showed disc bulge endplate osteophytes at C6-7 with moderate right and moderate to severe left foraminal stenosis.  Other levels showed only minor bulges.  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  She states her hand bothers her with the entire hand being numb she is right-hand dominant.  She is  noted a couple days where she had slight tingling in the left hand but nothing like the right.  She has pain when she will do steering well pain wakes her up at night she tends to grab her arm sometimes she puts ice or heat on her arm rubs it and shakes her hand which tends to help and then she can go back to sleep.  Review of Systems Past history of TIA, hyperlipidemia, tobacco use hypertension otherwise negative as pertains HPI.  Objective: Vital Signs: BP 132/86   Pulse 79   Ht 4' 10.5" (1.486 m)   Wt 123 lb (55.8 kg)   LMP  (LMP Unknown)   BMI 25.27 kg/m   Physical Exam Constitutional:      Appearance: She is well-developed.  HENT:     Head: Normocephalic.     Right Ear: External ear normal.     Left Ear: External ear normal.  Eyes:     Pupils: Pupils are equal, round, and reactive to light.  Neck:     Thyroid: No thyromegaly.     Trachea: No tracheal deviation.  Cardiovascular:     Rate and Rhythm: Normal rate.  Pulmonary:     Effort: Pulmonary effort is normal.  Abdominal:     Palpations: Abdomen is soft.  Skin:    General: Skin is warm and dry.  Neurological:  Mental Status: She is alert and oriented to person, place, and time.  Psychiatric:        Behavior: Behavior normal.     Ortho Exam patient has a 2+ upper extremity reflexes right and left.  Some brachial plexus tenderness right greater than left.  Moderately positive Spurling on the right negative on the left.  No trapezial tenderness.  No shoulder impingement.  Median and ulnar nerve at the elbow and forearm are normal.  Patient is tender over the carpal canal positive Phalen's positive carpal compression test right hand only.  No thenar atrophy.  Trace thenar weakness with resisted testing.  Specialty Comments:  No specialty comments available.  Imaging: XR Cervical Spine 2 or 3 views  Result Date: 10/21/2019 AP and lateral C-spine x-ray with lateral flexion-extension views obtained and reviewed.   There is disc space narrowing endplate spurring and uncovertebral changes at C6-7 with relative sparing of other levels. Impression: Cervical spondylosis C6-7 without instability.    PMFS History: Patient Active Problem List   Diagnosis Date Noted  . Rectal bleeding 10/10/2019  . Chronic diarrhea 10/10/2019  . Rectal pain 10/10/2019  . Internal hemorrhoids 10/10/2019  . Anal fissure 10/10/2019  . Nausea without vomiting 10/10/2019  . External hemorrhoids 10/06/2019  . Numbness and tingling in right hand 08/22/2019  . Pain, dental 02/17/2019  . Diarrhea 09/14/2017  . H/O TIA (transient ischemic attack) and stroke 06/12/2014  . Cervical radiculitis 02/12/2014  . Healthcare maintenance 04/30/2013  . Essential hypertension, benign 01/24/2010  . Microcytic anemia 01/11/2010  . Hyperlipemia 05/28/2007  . TOBACCO ABUSE 05/28/2007  . GERD 05/28/2007   Past Medical History:  Diagnosis Date  . Anemia   . Arthritis    "neck, hips" (04/25/2018)  . Claustrophobia   . Daily headache   . GERD (gastroesophageal reflux disease)   . Hiatal hernia   . Hyperlipidemia    "hx" (04/25/2018)  . Hypertension   . Plantar fasciitis    hx - left foot  . Stroke [facility]) 05/2014   "mini stroke" (04/25/2018)    Family History  Problem Relation Age of Onset  . Cancer Mother 7       pancreatic cancer  . Pancreatic cancer Mother   . Heart disease Mother   . Cancer Father 15       throat cancer  . Cancer Maternal Aunt 60       breast cancer  . Cancer Other        Died  from Colon cancer in 87's  . Colon cancer Maternal Uncle   . Colon cancer Paternal Uncle   . Heart disease Sister     Past Surgical History:  Procedure Laterality Date  . ABDOMINAL HYSTERECTOMY  12/09   Secondary to fibroids  . ARTERY BIOPSY Right 04/10/2018   Procedure: BIOPSY TEMPORAL ARTERY;  Surgeon: Rosetta Posner, MD;  Location: [facility];  Service: Vascular;  Laterality: Right;  . [place]; 1988  .  COLONOSCOPY    . FOOT SURGERY Left 03/2017   Bone Spurs Removed  . RADIOLOGY WITH ANESTHESIA N/A 04/26/2018   Procedure: MRI WITH ANESTHESIA;  Surgeon: Radiologist, Medication, MD;  Location: [facility];  Service: Radiology;  Laterality: N/A;  . RADIOLOGY WITH ANESTHESIA N/A 08/24/2019   Procedure: MRI WITH ANESTHESIA CERVICAL SPINE;  Surgeon: Radiologist, Medication, MD;  Location: [facility];  Service: Radiology;  Laterality: N/A;  . RADIOLOGY WITH ANESTHESIA N/A 10/02/2019   Procedure: MRI WITH ANESTHESIA CERVICAL  WITHOUT CONTRAST;  Surgeon: Radiologist, Medication, MD;  Location: [facility];  Service: Radiology;  Laterality: N/A;  . TUBAL LIGATION  1988   Social History   Occupational History  . Occupation: unemployed    Comment: used to work in a [place] [employer]  . Smoking status: Current Some Day Smoker    Packs/day: 0.25    Years: 38.00    Pack years: 9.50    Types: Cigarettes  . Smokeless tobacco: Never Used  . Tobacco comment: 0-4 cigs/day   Substance and Sexual Activity  . Alcohol use: Yes    Alcohol/week: 4.0 standard drinks    Types: 4 Cans of beer per week  . Drug use: Not Currently  . Sexual activity: Not Currently    Birth control/protection: Surgical    Comment: Hysterectomy

## 2019-10-21 NOTE — Addendum Note (Signed)
Addended by: Meyer Cory on: 10/21/2019 03:39 PM   Modules accepted: Orders

## 2019-10-22 ENCOUNTER — Telehealth: Payer: Self-pay | Admitting: Orthopaedic Surgery

## 2019-10-22 MED ORDER — TRAMADOL HCL 50 MG PO TABS: 50.0000 mg | ORAL_TABLET | Freq: Two times a day (BID) | ORAL | 0 refills | Status: DC | PRN

## 2019-10-22 NOTE — Telephone Encounter (Signed)
OK for ultram # 20  one po bid. She has already been on pain meds last few months and best to go slow so pain meds after surgery will work well for her when she needs it to work .

## 2019-10-22 NOTE — Telephone Encounter (Signed)
Patient called wanting to know if Dr. Lorin Mercy would send her in something for pain.  Patient uses [facility].  CB#[phone].  Thank you.

## 2019-10-22 NOTE — Telephone Encounter (Signed)
I called patient and advised. Rx called to pharmacy.

## 2019-10-22 NOTE — Telephone Encounter (Signed)
Please advise 

## 2019-10-23 ENCOUNTER — Other Ambulatory Visit: Payer: Self-pay

## 2019-10-23 ENCOUNTER — Ambulatory Visit
Admission: RE | Admit: 2019-10-23 | Discharge: 2019-10-23 | Disposition: A | Discharge status: Home / Self Care | Payer: 59 | Source: Ambulatory Visit | Attending: Internal Medicine | Admitting: Internal Medicine

## 2019-10-23 DIAGNOSIS — E2839 Other primary ovarian failure: Secondary | ICD-10-CM

## 2019-10-23 MED FILL — traMADol HCL 50 MG TABS: 50 | 10 days supply | Qty: 20 | Fill #0

## 2019-11-06 ENCOUNTER — Telehealth: Payer: Self-pay | Admitting: Gastroenterology

## 2019-11-06 NOTE — Telephone Encounter (Signed)
The patient has been notified of this information and all questions answered. She will get OTC iron and B12. She verbalized understanding that she should not start iron until after the colon.

## 2019-11-06 NOTE — Telephone Encounter (Signed)
Zehr, Laban Emperor, PA-C  Timothy Lasso, RN  I know that the patient has upcoming EGD and colonoscopy with Dr. Henrene Pastor. Her vitamin B12 levels are within normal limits, but are low normal so I would like her to begin a daily over-the-counter vitamin B12 supplement. Her iron studies are low as well so after her colonoscopy I would like her to begin ferrous sulfate 325 mg daily. Please have her wait until after her upcoming procedures to begin the iron so we do not interfere with her prepping for colonoscopy.   Thank you,   Jess

## 2019-11-11 ENCOUNTER — Encounter: Payer: Self-pay | Admitting: Internal Medicine

## 2019-11-11 ENCOUNTER — Ambulatory Visit (AMBULATORY_SURGERY_CENTER): Payer: 59 | Admitting: Internal Medicine

## 2019-11-11 ENCOUNTER — Other Ambulatory Visit: Payer: Self-pay

## 2019-11-11 VITALS — BP 168/87 | HR 70 | Temp 96.9°F | Resp 19 | Ht <= 58 in | Wt 123.0 lb

## 2019-11-11 DIAGNOSIS — K529 Noninfective gastroenteritis and colitis, unspecified: Secondary | ICD-10-CM | POA: Diagnosis not present

## 2019-11-11 DIAGNOSIS — R110 Nausea: Secondary | ICD-10-CM | POA: Diagnosis not present

## 2019-11-11 DIAGNOSIS — D123 Benign neoplasm of transverse colon: Secondary | ICD-10-CM

## 2019-11-11 DIAGNOSIS — D509 Iron deficiency anemia, unspecified: Secondary | ICD-10-CM

## 2019-11-11 DIAGNOSIS — K648 Other hemorrhoids: Secondary | ICD-10-CM | POA: Diagnosis present

## 2019-11-11 DIAGNOSIS — K635 Polyp of colon: Secondary | ICD-10-CM | POA: Diagnosis not present

## 2019-11-11 DIAGNOSIS — R634 Abnormal weight loss: Secondary | ICD-10-CM

## 2019-11-11 DIAGNOSIS — K625 Hemorrhage of anus and rectum: Secondary | ICD-10-CM

## 2019-11-11 MED ORDER — SODIUM CHLORIDE 0.9 % IV SOLN: 500.0000 mL | Freq: Once | INTRAVENOUS | Status: DC

## 2019-11-11 NOTE — Patient Instructions (Signed)
Handout on polyps given.   YOU HAD AN ENDOSCOPIC PROCEDURE TODAY AT THE [facility]:   Refer to the procedure report that was given to you for any specific questions about what was found during the examination.  If the procedure report does not answer your questions, please call your gastroenterologist to clarify.  If you requested that your care partner not be given the details of your procedure findings, then the procedure report has been included in a sealed envelope for you to review at your convenience later.  YOU SHOULD EXPECT: Some feelings of bloating in the abdomen. Passage of more gas than usual.  Walking can help get rid of the air that was put into your GI tract during the procedure and reduce the bloating. If you had a lower endoscopy (such as a colonoscopy or flexible sigmoidoscopy) you may notice spotting of blood in your stool or on the toilet paper. If you underwent a bowel prep for your procedure, you may not have a normal bowel movement for a few days.  Please Note:  You might notice some irritation and congestion in your nose or some drainage.  This is from the oxygen used during your procedure.  There is no need for concern and it should clear up in a day or so.  SYMPTOMS TO REPORT IMMEDIATELY:   Following lower endoscopy (colonoscopy or flexible sigmoidoscopy):  Excessive amounts of blood in the stool  Significant tenderness or worsening of abdominal pains  Swelling of the abdomen that is new, acute  Fever of 100F or higher   Following upper endoscopy (EGD)  Vomiting of blood or coffee ground material  New chest pain or pain under the shoulder blades  Painful or persistently difficult swallowing  New shortness of breath  Fever of 100F or higher  Black, tarry-looking stools  For urgent or emergent issues, a gastroenterologist can be reached at any hour by calling [phone]. Do not use MyChart messaging for urgent concerns.    DIET:  We do  recommend a small meal at first, but then you may proceed to your regular diet.  Drink plenty of fluids but you should avoid alcoholic beverages for 24 hours.  ACTIVITY:  You should plan to take it easy for the rest of today and you should NOT DRIVE or use heavy machinery until tomorrow (because of the sedation medicines used during the test).    FOLLOW UP: Our staff will call the number listed on your records 48-72 hours following your procedure to check on you and address any questions or concerns that you may have regarding the information given to you following your procedure. If we do not reach you, we will leave a message.  We will attempt to reach you two times.  During this call, we will ask if you have developed any symptoms of COVID 19. If you develop any symptoms (ie: fever, flu-like symptoms, shortness of breath, cough etc.) before then, please call [phone].  If you test positive for Covid 19 in the 2 weeks post procedure, please call and report this information to us.    If any biopsies were taken you will be contacted by phone or by letter within the next 1-3 weeks.  Please call us at [phone] if you have not heard about the biopsies in 3 weeks.    SIGNATURES/CONFIDENTIALITY: You and/or your care partner have signed paperwork which will be entered into your electronic medical record.  These signatures attest to the fact   that that the information above on your After Visit Summary has been reviewed and is understood.  Full responsibility of the confidentiality of this discharge information lies with you and/or your care-partner. 

## 2019-11-11 NOTE — Progress Notes (Signed)
pt tolerated well. VSS. awake and to recovery. Report given to RN. Bite block inserted and removed with ease. Atraumatic. 

## 2019-11-11 NOTE — Op Note (Signed)
[place] Patient Name: [name] Procedure Date: 11/11/2019 1:56 PM MRN: [id number] Endoscopist: Docia Chuck. Henrene Pastor , MD Age: 59 Referring MD:  Date of Birth: Feb 12, 1961 Gender: Female Account #: [id number] Procedure:                Upper GI endoscopy Indications:              Diarrhea, Weight loss, chronic microcytic anemia Medicines:                Monitored Anesthesia Care Procedure:                Pre-Anesthesia Assessment:                           - Prior to the procedure, a History and Physical                            was performed, and patient medications and                            allergies were reviewed. The patient's tolerance of                            previous anesthesia was also reviewed. The risks                            and benefits of the procedure and the sedation                            options and risks were discussed with the patient.                            All questions were answered, and informed consent                            was obtained. Prior Anticoagulants: The patient has                            taken no previous anticoagulant or antiplatelet                            agents. ASA Grade Assessment: II - A patient with                            mild systemic disease. After reviewing the risks                            and benefits, the patient was deemed in                            satisfactory condition to undergo the procedure.                           After obtaining informed consent, the endoscope was  passed under direct vision. Throughout the                            procedure, the patient's blood pressure, pulse, and                            oxygen saturations were monitored continuously. The                            Endoscope was introduced through the mouth, and                            advanced to the second part of duodenum. The upper                            GI  endoscopy was accomplished without difficulty.                            The patient tolerated the procedure well. Scope In: Scope Out: Findings:                 The esophagus was normal.                           The stomach was normal.                           The examined duodenum was normal.                           The cardia and gastric fundus were normal on                            retroflexion. Complications:            No immediate complications. Estimated Blood Loss:     Estimated blood loss: none. Impression:               1. Normal EGD                           2. Chronic microcytic anemia without obvious iron                            deficiency. Consider blood dyscrasia. Recommendation:           - Patient has a contact number available for                            emergencies. The signs and symptoms of potential                            delayed complications were discussed with the                            patient. Return to normal activities tomorrow.  Written discharge instructions were provided to the                            patient.                           - Resume previous diet.                           - Continue present medications.                           - Office follow-up with Dr. Henrene Pastor in a few weeks Docia Chuck. Henrene Pastor, MD 11/11/2019 2:46:17 PM This report has been signed electronically.

## 2019-11-11 NOTE — Op Note (Signed)
[place] Patient Name: [name] Procedure Date: 11/11/2019 1:56 PM MRN: [id number] Endoscopist: Docia Chuck. Henrene Pastor , MD Age: 59 Referring MD:  Date of Birth: March 10, 1961 Gender: Female Account #: [id number] Procedure:                Colonoscopy with biopsy; with cold snare                            polypectomy x 1 Indications:              Abdominal pain, Chronic diarrhea, Weight loss,                            microcytic anemia Medicines:                Monitored Anesthesia Care Procedure:                Pre-Anesthesia Assessment:                           - Prior to the procedure, a History and Physical                            was performed, and patient medications and                            allergies were reviewed. The patient's tolerance of                            previous anesthesia was also reviewed. The risks                            and benefits of the procedure and the sedation                            options and risks were discussed with the patient.                            All questions were answered, and informed consent                            was obtained. Prior Anticoagulants: The patient has                            taken no previous anticoagulant or antiplatelet                            agents. ASA Grade Assessment: II - A patient with                            mild systemic disease. After reviewing the risks                            and benefits, the patient was deemed in  satisfactory condition to undergo the procedure.                           After obtaining informed consent, the colonoscope                            was passed under direct vision. Throughout the                            procedure, the patient's blood pressure, pulse, and                            oxygen saturations were monitored continuously. The                            Colonoscope was introduced through the anus and                             advanced to the the cecum, identified by                            appendiceal orifice and ileocecal valve. The                            terminal ileum, ileocecal valve, appendiceal                            orifice, and rectum were photographed. The quality                            of the bowel preparation was excellent. The                            colonoscopy was performed without difficulty. The                            patient tolerated the procedure well. The bowel                            preparation used was SUPREP via split dose                            instruction. Scope In: 2:12:12 PM Scope Out: 2:26:59 PM Scope Withdrawal Time: 0 hours 12 minutes 29 seconds  Total Procedure Duration: 0 hours 14 minutes 47 seconds  Findings:                 The terminal ileum appeared normal.                           A 3 mm polyp was found in the transverse colon. The                            polyp was removed with a cold snare. Resection and  retrieval were complete.                           The entire examined colon appeared otherwise normal                            on direct and retroflexion views. Biopsies for                            histology were taken with a cold forceps from the                            entire colon for evaluation of microscopic colitis.                            Small internal hemorrhoids. A prominent                            hypertrophic anal papilla was noted. Complications:            No immediate complications. Estimated blood loss:                            None. Estimated Blood Loss:     Estimated blood loss: none. Impression:               - The examined portion of the ileum was normal.                           - One 3 mm polyp in the transverse colon, removed                            with a cold snare. Resected and retrieved.                           - Small hemorrhoids.  Prominent hypertrophic anal                            papilla                           - The entire examined colon is otherwise normal on                            direct and retroflexion views. Status post biopsies Recommendation:           - Repeat colonoscopy in 7 or 10 years for                            surveillance.                           - Patient has a contact number available for                            emergencies. The signs and symptoms of  potential                            delayed complications were discussed with the                            patient. Return to normal activities tomorrow.                            Written discharge instructions were provided to the                            patient.                           - Resume previous diet.                           - Continue present medications.                           - Await pathology results.                           - EGD today. Please see report regarding findings                            and final impressions and recommendations Beverley Sherrard N. Henrene Pastor, MD 11/11/2019 2:43:28 PM This report has been signed electronically.

## 2019-11-11 NOTE — Progress Notes (Signed)
Called to room to assist during endoscopic procedure.  Patient ID and intended procedure confirmed with present staff. Received instructions for my participation in the procedure from the performing physician.  

## 2019-11-13 ENCOUNTER — Telehealth: Payer: Self-pay

## 2019-11-13 ENCOUNTER — Telehealth: Payer: Self-pay | Admitting: *Deleted

## 2019-11-13 NOTE — Telephone Encounter (Signed)
  Follow up Call-  Call back number 11/11/2019  Post procedure Call Back phone  # [phone]  Permission to leave phone message Yes  Some recent data might be hidden     Patient questions:  Do you have a fever, pain , or abdominal swelling? No. Pain Score  0 *  Have you tolerated food without any problems? Yes.    Have you been able to return to your normal activities? Yes.    Do you have any questions about your discharge instructions: Diet   No. Medications  No. Follow up visit  No.  Do you have questions or concerns about your Care? No.  Actions: * If pain score is 4 or above: No action needed, pain <4.  1. Have you developed a fever since your procedure? no  2.   Have you had an respiratory symptoms (SOB or cough) since your procedure? no  3.   Have you tested positive for COVID 19 since your procedure no  4.   Have you had any family members/close contacts diagnosed with the COVID 19 since your procedure?  no   If yes to any of these questions please route to Joylene John, RN and Erenest Rasher, RN

## 2019-11-13 NOTE — Telephone Encounter (Signed)
  Follow up Call-  Call back number 11/11/2019  Post procedure Call Back phone  # [phone]  Permission to leave phone message Yes  Some recent data might be hidden     No answer at # given.  LM on VM.

## 2019-11-14 ENCOUNTER — Encounter: Payer: Self-pay | Admitting: Internal Medicine

## 2019-11-14 MED FILL — LISINOPRIL 20 MG TABLET: 20 | 30 days supply | Qty: 30 | Fill #4

## 2019-11-14 MED FILL — AMLODIPINE 2.5 MG TABLET: 2.5 | 30 days supply | Qty: 30 | Fill #4

## 2019-11-20 ENCOUNTER — Ambulatory Visit (INDEPENDENT_AMBULATORY_CARE_PROVIDER_SITE_OTHER): Payer: 59 | Admitting: Internal Medicine

## 2019-11-20 ENCOUNTER — Encounter: Payer: Self-pay | Admitting: Internal Medicine

## 2019-11-20 ENCOUNTER — Other Ambulatory Visit: Payer: Self-pay | Admitting: Internal Medicine

## 2019-11-20 DIAGNOSIS — K529 Noninfective gastroenteritis and colitis, unspecified: Secondary | ICD-10-CM

## 2019-11-20 DIAGNOSIS — K909 Intestinal malabsorption, unspecified: Secondary | ICD-10-CM | POA: Diagnosis not present

## 2019-11-20 DIAGNOSIS — M5412 Radiculopathy, cervical region: Secondary | ICD-10-CM

## 2019-11-20 DIAGNOSIS — R197 Diarrhea, unspecified: Secondary | ICD-10-CM

## 2019-11-20 MED ORDER — LISINOPRIL 20 MG PO TABS: 20.0000 mg | ORAL_TABLET | Freq: Every day | ORAL | 3 refills | Status: DC

## 2019-11-20 MED ORDER — HYDROCODONE-ACETAMINOPHEN 5-325 MG PO TABS: 1.0000 | ORAL_TABLET | Freq: Every evening | ORAL | 0 refills | Status: DC | PRN

## 2019-11-20 MED ORDER — AMLODIPINE BESYLATE 2.5 MG PO TABS: 2.5000 mg | ORAL_TABLET | Freq: Every day | ORAL | 3 refills | Status: DC

## 2019-11-20 MED FILL — HYDROCODON-APAP 5-325: 5-325 | 20 days supply | Qty: 20 | Fill #0

## 2019-11-20 NOTE — Patient Instructions (Signed)
1. I refilled your medicines for 90 days 2. I am checking a CT scan and stool study

## 2019-11-21 ENCOUNTER — Encounter: Payer: Self-pay | Admitting: Internal Medicine

## 2019-11-21 ENCOUNTER — Other Ambulatory Visit: Payer: Self-pay | Admitting: Internal Medicine

## 2019-11-21 LAB — CMP14 + ANION GAP
ALT: 32 IU/L (ref 0–32)
AST: 26 IU/L (ref 0–40)
Albumin/Globulin Ratio: 1.4 (ref 1.2–2.2)
Albumin: 4 g/dL (ref 3.8–4.9)
Alkaline Phosphatase: 80 IU/L (ref 48–121)
Anion Gap: 15 mmol/L (ref 10.0–18.0)
BUN/Creatinine Ratio: 17 (ref 9–23)
BUN: 10 mg/dL (ref 6–24)
Bilirubin Total: <0.2 mg/dL (ref 0.0–1.2)
CO2: 22 mmol/L (ref 20–29)
Calcium: 8.7 mg/dL (ref 8.7–10.2)
Chloride: 106 mmol/L (ref 96–106)
Creatinine, Ser: 0.59 mg/dL (ref 0.57–1.00)
GFR calc Af Amer: 117 mL/min/{1.73_m2} (ref >59)
GFR calc non Af Amer: 101 mL/min/{1.73_m2} (ref >59)
Globulin, Total: 2.9 g/dL (ref 1.5–4.5)
Glucose: 92 mg/dL (ref 65–99)
Potassium: 3 mmol/L — ABNORMAL LOW (ref 3.5–5.2)
Sodium: 143 mmol/L (ref 134–144)
Total Protein: 6.9 g/dL (ref 6.0–8.5)

## 2019-11-21 LAB — LIPID PANEL
Chol/HDL Ratio: 4.5 ratio — ABNORMAL HIGH (ref 0.0–4.4)
Cholesterol, Total: 230 mg/dL — ABNORMAL HIGH (ref 100–199)
HDL: 51 mg/dL (ref >39)
LDL Chol Calc (NIH): 119 mg/dL — ABNORMAL HIGH (ref 0–99)
Triglycerides: 344 mg/dL — ABNORMAL HIGH (ref 0–149)
VLDL Cholesterol Cal: 60 mg/dL — ABNORMAL HIGH (ref 5–40)

## 2019-11-21 MED ORDER — POTASSIUM CHLORIDE ER 20 MEQ PO TBCR: 20.0000 meq | EXTENDED_RELEASE_TABLET | Freq: Every day | ORAL | 1 refills | Status: DC

## 2019-11-21 MED FILL — POTASSIUM CHLORIDE CRYS ER: 20 | 30 days supply | Qty: 30 | Fill #0

## 2019-11-21 NOTE — Progress Notes (Signed)
   Subjective:    Patient ID: [name], female    DOB: [birth year], 59 y.o.   MRN: [id number]  HPI  [name] is here for pain anad D F/U. Please see the A&P for the status of the pt's chronic medical problems.  ROS : per ROS section and in problem oriented charting. All other systems are negative.  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  Review of Systems  Constitutional: Positive for unexpected weight change.  Gastrointestinal: Positive for diarrhea and nausea. Negative for abdominal pain, constipation and vomiting.       Objective:   Physical Exam Constitutional:      General: She is not in acute distress.    Appearance: Normal appearance. She is not ill-appearing, toxic-appearing or diaphoretic.  HENT:     Head: Normocephalic and atraumatic.     Right Ear: External ear normal.     Left Ear: External ear normal.  Eyes:     Extraocular Movements: Extraocular movements intact.     Conjunctiva/sclera: Conjunctivae normal.  Neurological:     Mental Status: She is alert. Mental status is at baseline.  Psychiatric:        Mood and Affect: Mood normal.        Behavior: Behavior normal.        Thought Content: Thought content normal.        Judgment: Judgment normal.        Assessment & Plan:

## 2019-11-21 NOTE — Assessment & Plan Note (Signed)
This problem is chronic and uncontrolled.  I reviewed Dr. Lorin Mercy' office notes.  Follow-up that in addition to the C6-C7 radiculopathy, that she might have carpal tunnel and he ordered nerve conduction studies and EMG.  [name] states that if she has carpal tunnel, but he wanted to do both surgeries at the same time.  She has not had these done.  He had prescribed her Ultram which she states did not help and she is not taking.  The pregabalin made her feel drugged during the day and she is not taking that.  She takes hydrocodone as needed at bedtime and requesting a refill today.  She is not using it excessively and there are no red or orange flag behaviors so I refilled it.  PLAN : Follow-up with Dr Lorin Mercy' plans Refilled hydrocodone

## 2019-11-21 NOTE — Assessment & Plan Note (Addendum)
This problem is chronic and uncontrolled.  [name] states that her diarrhea continues to occur.  She states that she will take 2 bites of a meal and then have rectal urgency and has to rush to the toilet and has liquid stool.  She denies any leakage or fecal incontinence.  She has associated sluggishness and weakness without any stamina to complete her work.  She has some nausea but no vomiting.  She denies any epigastric pain, prior pancreatitis.  I reviewed Dr. Blanch Media notes and he completed an EGD and colonoscopy with 1 3 mm sessile serrated sessile.  Random colon biopsies were negative for microscopic colitis or IBD.  [name] states that she has been referred on to a specialist.  She has had no further documented weight loss but she states that she is losing inches around her waist and in her legs.  We have started to rule out diagnoses, including celiac disease, microscopic colitis, IBD, viral gastroenteritis, C. difficile, hyperthyroidism... The low vitamin B12 and iron, which occurred rather quickly, between February when levels were normal and April when they were low, point towards malabsorption.  She has no risk factors for chronic pancreatitis.  She denies any prior episodes of pancreatitis, has never drank more than 2 beers 3 times a week, has no family history of pancreatitis... I checked her triglyceride level and it was 344.  Regardless, we agreed to do a CT scan to assess for calcifications and a stool elastase level.  SIBO would also be a possibility as she had a TAH/BSO and 2 C-sections.  As she will be seeing a specialist, I will leave breath testing to the specialist.  PLAN : CT scan to assess for changes consistent with chronic pancreatitis Follow-up specialist findings

## 2019-12-01 ENCOUNTER — Ambulatory Visit (INDEPENDENT_AMBULATORY_CARE_PROVIDER_SITE_OTHER): Payer: 59 | Admitting: Internal Medicine

## 2019-12-01 ENCOUNTER — Telehealth: Payer: Self-pay

## 2019-12-01 ENCOUNTER — Encounter: Payer: Self-pay | Admitting: Internal Medicine

## 2019-12-01 ENCOUNTER — Other Ambulatory Visit: Payer: Self-pay

## 2019-12-01 VITALS — BP 140/82 | HR 82 | Ht <= 58 in | Wt 121.0 lb

## 2019-12-01 DIAGNOSIS — R634 Abnormal weight loss: Secondary | ICD-10-CM | POA: Diagnosis not present

## 2019-12-01 DIAGNOSIS — R197 Diarrhea, unspecified: Secondary | ICD-10-CM

## 2019-12-01 MED ORDER — PANCRELIPASE (LIP-PROT-AMYL) 36000-114000 UNITS PO CPEP: 36000.0000 [IU] | ORAL_CAPSULE | Freq: Three times a day (TID) | ORAL | 3 refills | Status: DC

## 2019-12-01 NOTE — Patient Instructions (Signed)
We have sent the following medications to your pharmacy for you to pick up at your convenience:  Creon  - this has been sent to [facility].

## 2019-12-01 NOTE — Telephone Encounter (Signed)
Pt scheduled for CT of A/P at [facility] 12/09/19@3pm . Pt to arrive there at 2:45pm, be NPO 4 hours prior to the scan. Pt to pick up contrast prior to scan and drink bottle 1 of contrast at 1pm and bottle 2 at 2pm. Pt aware of appt.

## 2019-12-01 NOTE — Progress Notes (Signed)
HISTORY OF PRESENT ILLNESS:  [name] is a 59 y.o. female with past medical history listed below who presents today for follow-up regarding 1 to 2-year history of postprandial diarrhea and weight loss.  She was evaluated in this office by the GI PA October 10, 2019 regarding the same.  She does have chronic microcytic anemia without iron deficiency.  Blood work from Nov 20, 2019 potassium 3.0 for which she has been on potassium supplement.  A fecal elastase was ordered but not performed as patient which is being too loose.  Also, her PCP ordered a CT of the abdomen pelvis, but I cannot see it is scheduled.  Patient is accompanied today by her husband.  Symptoms continue with.  She describes postprandial loose stools.  Sometimes oily and floating.  Up to 10 bowel movements per day.  Rare symptoms at night.  Her gallbladder is in.  KUB from March 2019 revealed mellitus.  She did undergo complete colonoscopy and upper endoscopy 03/13/2020.  Colonoscopy revealed diminutive sessile serrated polyp which was removed.  Small hemorrhoids and a prominent hypertrophic anal papilla noted.  The ileum was normal.  Random colon biopsies were normal.  Endoscopy was.  Her weight is down 2 pounds since April.  REVIEW OF SYSTEMS:  All non-GI ROS negative otherwise stated except for visual change  Past Medical History:  Diagnosis Date  . Anemia   . Arthritis    "neck, hips" (04/25/2018)  . Claustrophobia   . Daily headache   . GERD (gastroesophageal reflux disease)   . Hiatal hernia   . Hyperlipidemia    "hx" (04/25/2018)  . Hypertension   . Plantar fasciitis    hx - left foot  . Stroke [facility]) 05/2014   "mini stroke" (04/25/2018)    Past Surgical History:  Procedure Laterality Date  . ABDOMINAL HYSTERECTOMY  12/09   Secondary to fibroids  . ARTERY BIOPSY Right 04/10/2018   Procedure: BIOPSY TEMPORAL ARTERY;  Surgeon: Rosetta Posner, MD;  Location: [place];  Service: Vascular;  Laterality: Right;  .  [place]; 1988  . COLONOSCOPY    . FOOT SURGERY Left 03/2017   Bone Spurs Removed  . RADIOLOGY WITH ANESTHESIA N/A 04/26/2018   Procedure: MRI WITH ANESTHESIA;  Surgeon: Radiologist, Medication, MD;  Location: [place];  Service: Radiology;  Laterality: N/A;  . RADIOLOGY WITH ANESTHESIA N/A 08/24/2019   Procedure: MRI WITH ANESTHESIA CERVICAL SPINE;  Surgeon: Radiologist, Medication, MD;  Location: [place];  Service: Radiology;  Laterality: N/A;  . RADIOLOGY WITH ANESTHESIA N/A 10/02/2019   Procedure: MRI WITH ANESTHESIA CERVICAL WITHOUT CONTRAST;  Surgeon: Radiologist, Medication, MD;  Location: [place];  Service: Radiology;  Laterality: N/A;  . TUBAL LIGATION  1988    Social History XAVIERA FLATEN  reports that she has been smoking cigarettes. She has a 9.50 pack-year smoking history. She has never used smokeless tobacco. She reports current alcohol use of about 4.0 standard drinks of alcohol per week. She reports previous drug use.  family history includes Cancer in an other family member; Cancer (age of onset: 19) in her maternal aunt; Cancer (age of onset: 3) in her mother; Cancer (age of onset: 35) in her father; Colon cancer in her maternal uncle and paternal uncle; Esophageal cancer in her father; Heart disease in her mother and sister; Pancreatic cancer in her mother.  Allergies  Allergen Reactions  . Aspirin Hives  . Hydromorphone Hcl Hives and Nausea And Vomiting  PHYSICAL EXAMINATION: Vital signs: BP 140/82   Pulse 82   Ht 4\' 10"  (1.473 m)   Wt 121 lb (54.9 kg)   LMP  (LMP Unknown)   BMI 25.29 kg/m   Constitutional: generally well-appearing, no acute distress Psychiatric: alert and oriented x3, cooperative Eyes: extraocular movements intact, anicteric, conjunctiva pink Mouth: oral pharynx moist, no lesions Neck: supple no lymphadenopathy Cardiovascular: heart regular rate and rhythm, no murmur Lungs: clear to auscultation bilaterally Abdomen: soft,  nontender, nondistended, no obvious ascites, no peritoneal signs, normal bowel sounds, no organomegaly Rectal: Omitted Extremities: no clubbing, cyanosis, or lower extremity edema bilaterally Skin: no lesions on visible extremities Neuro: No focal deficits.  Cranial nerves intact  ASSESSMENT:  1.  Chronic postprandial diarrhea.  Rule out pancreatic insufficiency.  Rule out bile salt related diarrhea.  Colitis and celiac disease have been ruled out. 2.  Nonprogressive weight loss.  Question absorption 3.  Sessile serrated polyp colonoscopy 4.  Prominent hypertrophic anal papilla 5.  GERD normal EGD, on omeprazole.  PLAN:  1.  Trial of Creon (36,000 units of lipase), take to each meal and snacks.  Multiple samples given.  Prescription submitted electronically 2.  Check the status of contrast-enhanced CT scan of the pelvis.  Assist with scheduling if needed. 3.  Surveillance colonoscopy 7 years 4.  Routine GI office follow-up 1 month Total time of 30 minutes spent preparing to see the patient, reviewing test, obtaining comprehensive interval history, forming comprehensive physical examination, counseling the patient and her husband regarding her condition, ordering medications and advanced imaging, and documenting clinical information in the health record

## 2019-12-01 NOTE — Telephone Encounter (Signed)
-----   Message from Irene Shipper, MD sent at 12/01/2019 10:39 AM EDT ----- Regarding: CT [name],I saw this patient in the office today for abdominal pain, weight loss, diarrhea.  Her PCP wanted to order a contrast CT of the abdomen and pelvis.  Patient was inquiring when this was going to be performed.  It looks like an order may have l been put in, but the exam not arranged.  I would like her to have a contrast-enhanced CT of the abdomen pelvis.  Please assist regarding scheduling if needed and contact patient regarding this examination.  Hyman Bower

## 2019-12-01 NOTE — Telephone Encounter (Signed)
Patient given Creon at office visit - I faxed the ambassador form to [place] and faxed information to [facility]

## 2019-12-03 NOTE — Telephone Encounter (Signed)
Waiting to hear from [facility]

## 2019-12-05 ENCOUNTER — Other Ambulatory Visit: Payer: Self-pay

## 2019-12-05 ENCOUNTER — Ambulatory Visit (INDEPENDENT_AMBULATORY_CARE_PROVIDER_SITE_OTHER): Payer: 59 | Admitting: Physical Medicine and Rehabilitation

## 2019-12-05 ENCOUNTER — Encounter: Payer: Self-pay | Admitting: Physical Medicine and Rehabilitation

## 2019-12-05 DIAGNOSIS — R202 Paresthesia of skin: Secondary | ICD-10-CM | POA: Diagnosis not present

## 2019-12-05 NOTE — Progress Notes (Signed)
Right upper arm pain and tingling in all fingers on right hand. Not as bad if lying on right side. Right hand dominant Numeric Pain Rating Scale and Functional Assessment Average Pain 10   In the last MONTH (on 0-10 scale) has pain interfered with the following?  1. General activity like being  able to carry out your everyday physical activities such as walking, climbing stairs, carrying groceries, or moving a chair?  Rating(10)  Wearing vaseline on hands

## 2019-12-08 ENCOUNTER — Telehealth: Payer: Self-pay | Admitting: Internal Medicine

## 2019-12-08 MED ORDER — PANCRELIPASE (LIP-PROT-AMYL) 36000-114000 UNITS PO CPEP: 36000.0000 [IU] | ORAL_CAPSULE | Freq: Three times a day (TID) | ORAL | 3 refills | Status: DC

## 2019-12-08 NOTE — Telephone Encounter (Signed)
Pt requested for Creon prescription to be sent to [place].  She stated that she does not have to pay for any medications through [facility].

## 2019-12-08 NOTE — Telephone Encounter (Signed)
Creon sent to [facility]

## 2019-12-08 NOTE — Progress Notes (Signed)
[name] - 59 y.o. female MRN [id number]  Date of birth: 03/11/1961  Office Visit Note: Visit Date: 12/05/2019 PCP: Bartholomew Crews, MD Referred by: Bartholomew Crews, MD  Subjective: Chief Complaint  Patient presents with  . Right Hand - Numbness  . Right Upper Arm - Pain   HPI:  [name] is a 59 y.o. female who comes in today At the request of Dr. Rodell Perna for electrodiagnostic study of the right upper limb.  Patient's been having chronic worsening right upper arm pain and tingling in a nondermatomal distribution into the whole hand and fingers on the right globally.  She has MRI of the cervical spine showing foraminal narrowing at C6-7.  She reports symptoms are not as bad if she is lying on her right side.  She rates her pain as a 10 out of 10.  She has not had prior electrodiagnostic study.  ROS Otherwise per HPI.  Assessment & Plan: Visit Diagnoses:  1. Paresthesia of skin     Plan: Impression: Essentially NORMAL electrodiagnostic study of the right upper limb.  There is no significant electrodiagnostic evidence of nerve entrapment, brachial plexopathy or cervical radiculopathy.    **As you know, purely sensory or demyelinating radiculopathies and chemical radiculitis may not be detected with this particular electrodiagnostic study.  Recommendations: 1.  Follow-up with referring physician. 2.  Continue current management of symptoms.  Meds & Orders: No orders of the defined types were placed in this encounter.   Orders Placed This Encounter  Procedures  . NCV with EMG (electromyography)    Follow-up: Return for Rodell Perna, MD.   Procedures: No procedures performed  EMG & NCV Findings: All nerve conduction studies (as indicated in the following tables) were within normal limits.    All examined muscles (as indicated in the following table) showed no evidence of electrical instability.    Impression: Essentially NORMAL electrodiagnostic  study of the right upper limb.  There is no significant electrodiagnostic evidence of nerve entrapment, brachial plexopathy or cervical radiculopathy.    **As you know, purely sensory or demyelinating radiculopathies and chemical radiculitis may not be detected with this particular electrodiagnostic study.  Recommendations: 1.  Follow-up with referring physician. 2.  Continue current management of symptoms.  ___________________________ Laurence Spates FAAPMR Board Certified, American Board of Physical Medicine and Rehabilitation    Nerve Conduction Studies Anti Sensory Summary Table   Stim Site NR Peak (ms) Norm Peak (ms) P-T Amp (V) Norm P-T Amp Site1 Site2 Delta-P (ms) Dist (cm) Vel (m/s) Norm Vel (m/s)  Right Median Acr Palm Anti Sensory (2nd Digit)  30.7C  Wrist    3.6 <3.6 37.6 >10 Wrist Palm 1.8 0.0    Palm    1.8 <2.0 22.8         Right Radial Anti Sensory (Base 1st Digit)  30.8C  Wrist    2.2 <3.1 20.4  Wrist Base 1st Digit 2.2 0.0    Right Ulnar Anti Sensory (5th Digit)  31C  Wrist    3.3 <3.7 21.5 >15.0 Wrist 5th Digit 3.3 14.0 42 >38   Motor Summary Table   Stim Site NR Onset (ms) Norm Onset (ms) O-P Amp (mV) Norm O-P Amp Site1 Site2 Delta-0 (ms) Dist (cm) Vel (m/s) Norm Vel (m/s)  Right Median Motor (Abd Poll Brev)  30.9C  Wrist    3.4 <4.2 8.3 >5 Elbow Wrist 3.6 18.5 51 >50  Elbow    7.0  8.2  Right Ulnar Motor (Abd Dig Min)  30.9C  Wrist    3.2 <4.2 6.8 >3 B Elbow Wrist 2.6 17.0 65 >53  B Elbow    5.8  6.8  A Elbow B Elbow 1.3 9.0 69 >53  A Elbow    7.1  6.7          EMG   Side Muscle Nerve Root Ins Act Fibs Psw Amp Dur Poly Recrt Int Fraser Din Comment  Right Abd Poll Brev Median C8-T1 Nml Nml Nml Nml Nml 0 Nml Nml   Right 1stDorInt Ulnar C8-T1 Nml Nml Nml Nml Nml 0 Nml Nml   Right PronatorTeres Median C6-7 Nml Nml Nml Nml Nml 0 Nml Nml   Right Biceps Musculocut C5-6 Nml Nml Nml Nml Nml 0 Nml Nml   Right Deltoid Axillary C5-6 Nml Nml Nml Nml Nml 0 Nml Nml      Nerve Conduction Studies Anti Sensory Left/Right Comparison   Stim Site L Lat (ms) R Lat (ms) L-R Lat (ms) L Amp (V) R Amp (V) L-R Amp (%) Site1 Site2 L Vel (m/s) R Vel (m/s) L-R Vel (m/s)  Median Acr Palm Anti Sensory (2nd Digit)  30.7C  Wrist  3.6   37.6  Wrist Palm     Palm  1.8   22.8        Radial Anti Sensory (Base 1st Digit)  30.8C  Wrist  2.2   20.4  Wrist Base 1st Digit     Ulnar Anti Sensory (5th Digit)  31C  Wrist  3.3   21.5  Wrist 5th Digit  42    Motor Left/Right Comparison   Stim Site L Lat (ms) R Lat (ms) L-R Lat (ms) L Amp (mV) R Amp (mV) L-R Amp (%) Site1 Site2 L Vel (m/s) R Vel (m/s) L-R Vel (m/s)  Median Motor (Abd Poll Brev)  30.9C  Wrist  3.4   8.3  Elbow Wrist  51   Elbow  7.0   8.2        Ulnar Motor (Abd Dig Min)  30.9C  Wrist  3.2   6.8  B Elbow Wrist  65   B Elbow  5.8   6.8  A Elbow B Elbow  69   A Elbow  7.1   6.7           Waveforms:             Clinical History: CLINICAL DATA:  Right neck pain radiating into right extremity  EXAM: MRI CERVICAL SPINE WITHOUT CONTRAST  TECHNIQUE: Multiplanar, multisequence MR imaging of the cervical spine was performed. No intravenous contrast was administered.  COMPARISON:  2015  FINDINGS: Alignment: Anteroposterior alignment is maintained.  Vertebrae: Vertebral body heights are preserved. No marrow edema or suspicious osseous lesion.  Cord: No abnormal signal.  Posterior Fossa, vertebral arteries, paraspinal tissues: Unremarkable.  Disc levels:  C2-C3:  No canal or foraminal stenosis.  C3-C4: Minor disc bulge. No significant canal or foraminal stenosis.  C4-C5: Minor disc bulge with small endplate osteophytes. No significant canal or foraminal stenosis.  C5-C6: Minor disc bulge with small endplate osteophytes. No significant canal or foraminal stenosis.  C6-C7: Disc bulge with small endplate osteophytes. No significant canal stenosis. Increased mild to  moderate right and moderate to marked left foraminal stenosis.  C7-T1: Minor disc bulge and small endplate osteophytes. No significant canal or foraminal stenosis.  IMPRESSION: Progression of degenerative changes at C6-C7 with left greater than right foraminal stenosis.  Electronically Signed   By: Macy Mis M.D.   On: 10/02/2019 11:38     Objective:  VS:  HT:    WT:   BMI:     BP:   HR: bpm  TEMP: ( )  RESP:  Physical Exam Musculoskeletal:        General: No swelling, tenderness or deformity.     Comments: Inspection reveals no atrophy of the bilateral APB or FDI or hand intrinsics. There is no swelling, color changes, allodynia or dystrophic changes. There is 5 out of 5 strength in the bilateral wrist extension, finger abduction and long finger flexion. There is intact sensation to light touch in all dermatomal and peripheral nerve distributions. There is a negative Hoffmann's test bilaterally.  Skin:    General: Skin is warm and dry.     Findings: No erythema or rash.  Neurological:     General: No focal deficit present.     Mental Status: She is alert and oriented to person, place, and time.     Motor: No weakness or abnormal muscle tone.     Coordination: Coordination normal.  Psychiatric:        Mood and Affect: Mood normal.        Behavior: Behavior normal.      Imaging: No results found.

## 2019-12-08 NOTE — Procedures (Signed)
EMG & NCV Findings: All nerve conduction studies (as indicated in the following tables) were within normal limits.    All examined muscles (as indicated in the following table) showed no evidence of electrical instability.    Impression: Essentially NORMAL electrodiagnostic study of the right upper limb.  There is no significant electrodiagnostic evidence of nerve entrapment, brachial plexopathy or cervical radiculopathy.    **As you know, purely sensory or demyelinating radiculopathies and chemical radiculitis may not be detected with this particular electrodiagnostic study.  Recommendations: 1.  Follow-up with referring physician. 2.  Continue current management of symptoms.  ___________________________ Laurence Spates FAAPMR Board Certified, American Board of Physical Medicine and Rehabilitation    Nerve Conduction Studies Anti Sensory Summary Table   Stim Site NR Peak (ms) Norm Peak (ms) P-T Amp (V) Norm P-T Amp Site1 Site2 Delta-P (ms) Dist (cm) Vel (m/s) Norm Vel (m/s)  Right Median Acr Palm Anti Sensory (2nd Digit)  30.7C  Wrist    3.6 <3.6 37.6 >10 Wrist Palm 1.8 0.0    Palm    1.8 <2.0 22.8         Right Radial Anti Sensory (Base 1st Digit)  30.8C  Wrist    2.2 <3.1 20.4  Wrist Base 1st Digit 2.2 0.0    Right Ulnar Anti Sensory (5th Digit)  31C  Wrist    3.3 <3.7 21.5 >15.0 Wrist 5th Digit 3.3 14.0 42 >38   Motor Summary Table   Stim Site NR Onset (ms) Norm Onset (ms) O-P Amp (mV) Norm O-P Amp Site1 Site2 Delta-0 (ms) Dist (cm) Vel (m/s) Norm Vel (m/s)  Right Median Motor (Abd Poll Brev)  30.9C  Wrist    3.4 <4.2 8.3 >5 Elbow Wrist 3.6 18.5 51 >50  Elbow    7.0  8.2         Right Ulnar Motor (Abd Dig Min)  30.9C  Wrist    3.2 <4.2 6.8 >3 B Elbow Wrist 2.6 17.0 65 >53  B Elbow    5.8  6.8  A Elbow B Elbow 1.3 9.0 69 >53  A Elbow    7.1  6.7          EMG   Side Muscle Nerve Root Ins Act Fibs Psw Amp Dur Poly Recrt Int Fraser Din Comment  Right Abd Poll Brev Median  C8-T1 Nml Nml Nml Nml Nml 0 Nml Nml   Right 1stDorInt Ulnar C8-T1 Nml Nml Nml Nml Nml 0 Nml Nml   Right PronatorTeres Median C6-7 Nml Nml Nml Nml Nml 0 Nml Nml   Right Biceps Musculocut C5-6 Nml Nml Nml Nml Nml 0 Nml Nml   Right Deltoid Axillary C5-6 Nml Nml Nml Nml Nml 0 Nml Nml     Nerve Conduction Studies Anti Sensory Left/Right Comparison   Stim Site L Lat (ms) R Lat (ms) L-R Lat (ms) L Amp (V) R Amp (V) L-R Amp (%) Site1 Site2 L Vel (m/s) R Vel (m/s) L-R Vel (m/s)  Median Acr Palm Anti Sensory (2nd Digit)  30.7C  Wrist  3.6   37.6  Wrist Palm     Palm  1.8   22.8        Radial Anti Sensory (Base 1st Digit)  30.8C  Wrist  2.2   20.4  Wrist Base 1st Digit     Ulnar Anti Sensory (5th Digit)  31C  Wrist  3.3   21.5  Wrist 5th Digit  42    Motor Left/Right Comparison  Stim Site L Lat (ms) R Lat (ms) L-R Lat (ms) L Amp (mV) R Amp (mV) L-R Amp (%) Site1 Site2 L Vel (m/s) R Vel (m/s) L-R Vel (m/s)  Median Motor (Abd Poll Brev)  30.9C  Wrist  3.4   8.3  Elbow Wrist  51   Elbow  7.0   8.2        Ulnar Motor (Abd Dig Min)  30.9C  Wrist  3.2   6.8  B Elbow Wrist  65   B Elbow  5.8   6.8  A Elbow B Elbow  69   A Elbow  7.1   6.7           Waveforms:

## 2019-12-09 ENCOUNTER — Ambulatory Visit (HOSPITAL_COMMUNITY): Admission: RE | Admit: 2019-12-09 | Payer: 59 | Source: Ambulatory Visit

## 2019-12-15 ENCOUNTER — Ambulatory Visit: Payer: 59

## 2019-12-15 ENCOUNTER — Telehealth: Payer: Self-pay | Admitting: Internal Medicine

## 2019-12-15 ENCOUNTER — Encounter: Payer: Self-pay | Admitting: *Deleted

## 2019-12-15 MED FILL — OMEPRAZOLE 40 MG CPDR: 40 | 90 days supply | Qty: 90 | Fill #2

## 2019-12-15 MED FILL — CREON DR 36,000 UNITS CAP: 36000-11400 | 30 days supply | Qty: 210 | Fill #0

## 2019-12-15 MED FILL — AMLODIPINE 2.5 MG TABLET: 2.5 | 30 days supply | Qty: 30 | Fill #5

## 2019-12-15 MED FILL — LISINOPRIL 20 MG TABLET: 20 | 30 days supply | Qty: 30 | Fill #5

## 2019-12-16 ENCOUNTER — Ambulatory Visit: Payer: 59

## 2019-12-16 ENCOUNTER — Ambulatory Visit (INDEPENDENT_AMBULATORY_CARE_PROVIDER_SITE_OTHER): Payer: 59 | Admitting: Internal Medicine

## 2019-12-16 VITALS — BP 142/82 | HR 79 | Temp 99.1°F | Ht <= 58 in | Wt 125.8 lb

## 2019-12-16 DIAGNOSIS — R197 Diarrhea, unspecified: Secondary | ICD-10-CM | POA: Diagnosis not present

## 2019-12-16 DIAGNOSIS — K529 Noninfective gastroenteritis and colitis, unspecified: Secondary | ICD-10-CM

## 2019-12-16 DIAGNOSIS — E876 Hypokalemia: Secondary | ICD-10-CM | POA: Diagnosis not present

## 2019-12-16 NOTE — Telephone Encounter (Signed)
Spoke with patient and told her I would leave her some Creon samples up front to be picked up.  Patient agreed.

## 2019-12-16 NOTE — Patient Instructions (Signed)
Thank you for allowing [place] to provide your care today. Today we discussed your diarrhea and low potassium. I am glad that your diarrhea improved and you feel better after starting Creon. Please continue taking Creaon and follow up with GI doctor.   Continue taking potassium supplement for now. I check your potassium level today and I will call if any are abnormal. Based on the lab result, we decide if we need to stop or continue potassium supplement.  Today we made no changes to your medications.    Please follow-up in clinic in 2-4 weeks for blood work.    Should you have any questions or concerns please call the internal medicine clinic at [phone].

## 2019-12-16 NOTE — Progress Notes (Deleted)
   CC: F/u of hypokalemia and diarrhea  HPI:  [name] is a 59 y.o. female with PMHx as documented below, presented for follow up of hypokalemia and diarrhea. Please refer to problem based charting for further details and assessment and plan of current problem and chronic medical conditions.  PMHx: HTN, stroke, GERD, chronic diarrhea, cervical radiculitis, anemia, HLD, tobacco abuse  Medication: Amlodipine 2.5 mg daily, ferrous sulfate, PRN hydrocodone-acetaminophen, Creon, lisinopril 20 mg daily, omeprazole, potassium chloride 20 MEQ daily  Past Medical History:  Diagnosis Date  . Anemia   . Arthritis    "neck, hips" (04/25/2018)  . Claustrophobia   . Daily headache   . GERD (gastroesophageal reflux disease)   . Hiatal hernia   . Hyperlipidemia    "hx" (04/25/2018)  . Hypertension   . Plantar fasciitis    hx - left foot  . Stroke [facility]) 05/2014   "mini stroke" (04/25/2018)   Review of Systems:  ***  Physical Exam:  Vitals:   12/16/19 1403  BP: (!) 142/82  Pulse: 79  Temp: 99.1 F (37.3 C)  TempSrc: Oral  SpO2: 98%  Weight: 125 lb 12.8 oz (57.1 kg)  Height: 4\' 10"  (1.473 m)   ***  Assessment & Plan:  Last CMP 11/20/2019 with hypokalemia (potassium 3).  Patient has been on potassium supplements since then.   -Repeating BMP today***   See Encounters Tab for problem based charting.  Patient discussed with Dr. {NAMES:[id number]::"Butcher","Guilloud","Hoffman","Mullen","Narendra","Raines","Vincent"}

## 2019-12-16 NOTE — Assessment & Plan Note (Addendum)
Patient mentions significant improvement of diarrhea since she was started on Creon. She mentions that although her stool is still loose and not completely formed, her bowel movement is now less frequent decreased from 4-5 times a day to 1-2 times a day. Her energy level back to normal now. She is really happy with the improvement.  During evaluation by GI, complete colonoscopy and upper endoscopy performed on 11/11/2019 with no pathologic finding that can ecplain underlying cause of diarrhea. CT abdomen ordered ordered but not performed.  (Colonoscopy revealed diminutive sessile serrated polyp which was removed.  Small hemorrhoids and a prominent hypertrophic anal papilla noted. The ileum was normal.  Random colon biopsies were normal.)   -Continue Creon -F/u with GI -She states that she did not go for CT scan because she is claustrophobic and is not willing to do the CT scan despite I explained her that CT scan is different than MRI and that will be quicker. She still mentions that she can not do that.

## 2019-12-17 ENCOUNTER — Encounter: Payer: Self-pay | Admitting: Internal Medicine

## 2019-12-17 DIAGNOSIS — E876 Hypokalemia: Secondary | ICD-10-CM | POA: Insufficient documentation

## 2019-12-17 LAB — BMP8+ANION GAP
Anion Gap: 13 mmol/L (ref 10.0–18.0)
BUN/Creatinine Ratio: 25 — ABNORMAL HIGH (ref 9–23)
BUN: 14 mg/dL (ref 6–24)
CO2: 23 mmol/L (ref 20–29)
Calcium: 8.6 mg/dL — ABNORMAL LOW (ref 8.7–10.2)
Chloride: 108 mmol/L — ABNORMAL HIGH (ref 96–106)
Creatinine, Ser: 0.56 mg/dL — ABNORMAL LOW (ref 0.57–1.00)
GFR calc Af Amer: 119 mL/min/{1.73_m2} (ref >59)
GFR calc non Af Amer: 103 mL/min/{1.73_m2} (ref >59)
Glucose: 97 mg/dL (ref 65–99)
Potassium: 3.4 mmol/L — ABNORMAL LOW (ref 3.5–5.2)
Sodium: 144 mmol/L (ref 134–144)

## 2019-12-17 NOTE — Progress Notes (Signed)
   CC: F/u of hypokalemia and diarrhea  HPI:  [name] is a 59 y.o. female with PMHx as documented below, presented for follow up of hypokalemia and diarrhea. Please refer to problem based charting for further details and assessment and plan of current problem and chronic medical conditions.  PMHx: HTN, stroke, GERD, chronic diarrhea, cervical radiculitis, anemia, HLD, tobacco abuse  Medications: Amlodipine 2.5 mg daily, ferrous sulfate, PRN hydrocodone-acetaminophen, Creon, lisinopril 20 mg daily, omeprazole, potassium chloride 20 MEQ daily  Past Medical History:  Diagnosis Date  . Anemia   . Arthritis    "neck, hips" (04/25/2018)  . Claustrophobia   . Daily headache   . GERD (gastroesophageal reflux disease)   . Hiatal hernia   . Hyperlipidemia    "hx" (04/25/2018)  . Hypertension   . Plantar fasciitis    hx - left foot  . Stroke [facility]) 05/2014   "mini stroke" (04/25/2018)   Review of Systems:   Constitutional: Negative for chills and fever.  Respiratory: Negative for shortness of breath.   Cardiovascular: Negative for chest pain and leg swelling.  Gastrointestinal: Negative for abdominal pain, nausea and vomiting, positive for loose stool but not diarrhea Neurological: Negative for dizziness and headaches.   Physical Exam:  Vitals:   12/16/19 1403  BP: (!) 142/82  Pulse: 79  Temp: 99.1 F (37.3 C)  TempSrc: Oral  SpO2: 98%  Weight: 125 lb 12.8 oz (57.1 kg)  Height: 4\' 10"  (1.473 m)   Constitutional: Well-developed and well-nourished. No acute distress.  HENT:  Head: Normocephalic and atraumatic.  Eyes: Conjunctivae are normal, EOM nl Cardiovascular:  RRR, nl S1S2, no murmur,  no LEE Respiratory: Effort normal and breath sounds normal. No respiratory distress. No wheezes.  GI: Soft. Bowel sounds are normal. No distension. There is no tenderness.  Neurological: Is alert and oriented x 3  Skin: Not diaphoretic. No erythema.  Psychiatric: Normal mood  and affect. Behavior is normal. Judgment and thought content normal.   Assessment & Plan:   See Encounters Tab for problem based charting.  Patient discussed with Dr. Philipp Ovens

## 2019-12-17 NOTE — Assessment & Plan Note (Signed)
Last CMP 11/20/2019 with hypokalemia (potassium 3) and she started on potassium supplements 20 meq QD since then.  Hypokalemia is likely is setting of diarrhea. Her bowel movement is now less frequent since started on Creaon by GI. But it is still loose stool once or twice a day.  -Repeating BMP today >ADDENDUM K 3.4 while still on K supplement. Likley due to having some residual loose stool. I called patient and discussed the result. Asked her to continue K supplement and recheck K in 2 weeks. I anticipate it it will improved when her GI symptoms (loose stool) improves. She will follow up with GI as well. She was not able to complete the conversation being at work. I asked her to continue K supplement and return to clinic in 2 weeks for K check.  -Continue K supplement -Follow up in clinic in 2 weeks for K recheck -Follow up with GI

## 2019-12-18 ENCOUNTER — Ambulatory Visit: Payer: 59

## 2019-12-19 NOTE — Progress Notes (Signed)
Internal Medicine Clinic Attending  Case discussed with Dr. Masoudi  at the time of the visit.  We reviewed the resident's history and exam and pertinent patient test results.  I agree with the assessment, diagnosis, and plan of care documented in the resident's note.  

## 2019-12-23 ENCOUNTER — Ambulatory Visit (INDEPENDENT_AMBULATORY_CARE_PROVIDER_SITE_OTHER): Payer: 59 | Admitting: Orthopaedic Surgery

## 2019-12-23 ENCOUNTER — Encounter: Payer: Self-pay | Admitting: Orthopaedic Surgery

## 2019-12-23 DIAGNOSIS — M5412 Radiculopathy, cervical region: Secondary | ICD-10-CM

## 2019-12-24 ENCOUNTER — Ambulatory Visit: Payer: 59 | Admitting: Internal Medicine

## 2019-12-24 NOTE — Progress Notes (Signed)
Office Visit Note   Patient: [name]           Date of Birth: 05/28/1961           MRN: [id number] Visit Date: 12/23/2019              Requested by: Bartholomew Crews, MD [address] [place],  [place] [address] PCP: Aldine Contes, MD   Assessment & Plan: Visit Diagnoses:  1. Cervical radiculitis     Plan: We will continue some conservative treatment.  We discussed possible intervention single level cervical discectomy and fusion allograft and plate.  We will recheck her again in 1 month.  Follow-Up Instructions: Return in about 1 month (around 01/22/2020).   Orders:  No orders of the defined types were placed in this encounter.  No orders of the defined types were placed in this encounter.     Procedures: No procedures performed   Clinical Data: No additional findings.   Subjective: Chief Complaint  Patient presents with  . Right Arm - Follow-up    HPI 59 year old female returns with continued problems with neck pain and right hand numbness.  MRI scan showed neuroforaminal stenosis at C6-7.  Electrical tests were obtained to make sure she did not have carpal compression or ulnar nerve problems in addition to the neck.  Electrical tests were negative for evidence of peripheral nerve entrapment, brachial plexopathy or cervical radiculopathy.  Patient has not been through any recent therapy.  Pain bothers her when she turns her neck.  No symptoms in her left arm.  Patient been treated with hydrocodone, anti-inflammatories, Lyrica.  Review of Systems 14 point system update unchanged from 10/21/2019 note.   Objective: Vital Signs: LMP  (LMP Unknown)   Physical Exam Constitutional:      Appearance: She is well-developed.  HENT:     Head: Normocephalic.     Right Ear: External ear normal.     Left Ear: External ear normal.  Eyes:     Pupils: Pupils are equal, round, and reactive to light.  Neck:     Thyroid: No thyromegaly.     Trachea: No  tracheal deviation.  Cardiovascular:     Rate and Rhythm: Normal rate.  Pulmonary:     Effort: Pulmonary effort is normal.  Abdominal:     Palpations: Abdomen is soft.  Skin:    General: Skin is warm and dry.  Neurological:     Mental Status: She is alert and oriented to person, place, and time.  Psychiatric:        Behavior: Behavior normal.     Ortho Exam patient has intact reflexes no brachial plexus tenderness on the right side none on the left side.  Positive Spurling on the right negative Lhermitte.  No lower extremity clonus.  No thenar atrophy.  She still tender over the carpal canal.  No hypothenar weakness.  Specialty Comments:  No specialty comments available.  Imaging: Previous cervical MRI 10/02/2019 showed progressive degenerative changes at C6-7 with left greater than right foraminal stenosis.   PMFS History: Patient Active Problem List   Diagnosis Date Noted  . Hypokalemia 12/17/2019  . Chronic diarrhea 09/14/2017  . H/O TIA (transient ischemic attack) and stroke 06/12/2014  . Cervical radiculitis 02/12/2014  . Healthcare maintenance 04/30/2013  . Essential hypertension, benign 01/24/2010  . Microcytic anemia 01/11/2010  . Hyperlipemia 05/28/2007  . TOBACCO ABUSE 05/28/2007  . GERD 05/28/2007   Past Medical History:  Diagnosis Date  .  Anemia   . Arthritis    "neck, hips" (04/25/2018)  . Claustrophobia   . Daily headache   . GERD (gastroesophageal reflux disease)   . Hiatal hernia   . Hyperlipidemia    "hx" (04/25/2018)  . Hypertension   . Plantar fasciitis    hx - left foot  . Stroke [facility]) 05/2014   "mini stroke" (04/25/2018)    Family History  Problem Relation Age of Onset  . Cancer Mother 19       pancreatic cancer  . Pancreatic cancer Mother   . Heart disease Mother   . Cancer Father 34       throat cancer  . Esophageal cancer Father   . Cancer Maternal Aunt 60       breast cancer  . Cancer Other        Died  from Colon cancer in  61's  . Colon cancer Maternal Uncle   . Colon cancer Paternal Uncle   . Heart disease Sister   . Rectal cancer Neg Hx   . Stomach cancer Neg Hx     Past Surgical History:  Procedure Laterality Date  . ABDOMINAL HYSTERECTOMY  12/09   Secondary to fibroids  . ARTERY BIOPSY Right 04/10/2018   Procedure: BIOPSY TEMPORAL ARTERY;  Surgeon: Rosetta Posner, MD;  Location: [place];  Service: Vascular;  Laterality: Right;  . [place]; 1988  . COLONOSCOPY    . FOOT SURGERY Left 03/2017   Bone Spurs Removed  . RADIOLOGY WITH ANESTHESIA N/A 04/26/2018   Procedure: MRI WITH ANESTHESIA;  Surgeon: Radiologist, Medication, MD;  Location: [place];  Service: Radiology;  Laterality: N/A;  . RADIOLOGY WITH ANESTHESIA N/A 08/24/2019   Procedure: MRI WITH ANESTHESIA CERVICAL SPINE;  Surgeon: Radiologist, Medication, MD;  Location: [place];  Service: Radiology;  Laterality: N/A;  . RADIOLOGY WITH ANESTHESIA N/A 10/02/2019   Procedure: MRI WITH ANESTHESIA CERVICAL WITHOUT CONTRAST;  Surgeon: Radiologist, Medication, MD;  Location: [place];  Service: Radiology;  Laterality: N/A;  . TUBAL LIGATION  1988   Social History   Occupational History  . Occupation: unemployed    Comment: used to work in a [employer]  . Smoking status: Current Some Day Smoker    Packs/day: 0.25    Years: 38.00    Pack years: 9.50    Types: Cigarettes  . Smokeless tobacco: Never Used  . Tobacco comment: 0-4 cigs/day   Vaping Use  . Vaping Use: Never used  Substance and Sexual Activity  . Alcohol use: Yes    Alcohol/week: 4.0 standard drinks    Types: 4 Cans of beer per week  . Drug use: Not Currently  . Sexual activity: Not Currently    Birth control/protection: Surgical    Comment: Hysterectomy

## 2019-12-30 ENCOUNTER — Ambulatory Visit (INDEPENDENT_AMBULATORY_CARE_PROVIDER_SITE_OTHER): Payer: 59 | Admitting: Internal Medicine

## 2019-12-30 ENCOUNTER — Encounter: Payer: Self-pay | Admitting: Internal Medicine

## 2019-12-30 VITALS — BP 144/89 | HR 80 | Temp 98.8°F | Ht <= 58 in | Wt 126.5 lb

## 2019-12-30 DIAGNOSIS — K529 Noninfective gastroenteritis and colitis, unspecified: Secondary | ICD-10-CM

## 2019-12-30 DIAGNOSIS — M5412 Radiculopathy, cervical region: Secondary | ICD-10-CM | POA: Diagnosis not present

## 2019-12-30 DIAGNOSIS — E876 Hypokalemia: Secondary | ICD-10-CM | POA: Diagnosis not present

## 2019-12-30 MED ORDER — HYDROXYZINE HCL 10 MG PO TABS: 10.0000 mg | ORAL_TABLET | Freq: Three times a day (TID) | ORAL | 0 refills | Status: DC | PRN

## 2019-12-30 MED FILL — hydrOXYzine HCL 10 MG TABS: 10 | 3 days supply | Qty: 10 | Fill #0

## 2019-12-30 MED FILL — CREON DR 36,000 UNITS CAP: 36000-11400 | 30 days supply | Qty: 210 | Fill #0

## 2019-12-30 NOTE — Patient Instructions (Addendum)
Thank you for allowing [place] to provide your care today. Today we discussed your diarrhea    I have ordered bmp labs for you. I will call if any are abnormal.    Today we made the following changes to your medications.    Please take hydroxyzine as needed for your CT scan  Please follow-up in 3 months.    Should you have any questions or concerns please call the internal medicine clinic at [phone].     Gallbladder Eating Plan If you have a gallbladder condition, you may have trouble digesting fats. Eating a low-fat diet can help reduce your symptoms, and may be helpful before and after having surgery to remove your gallbladder (cholecystectomy). Your health care provider may recommend that you work with a diet and nutrition specialist (dietitian) to help you reduce the amount of fat in your diet. What are tips for following this plan? General guidelines  Limit your fat intake to less than 30% of your total daily calories. If you eat around 1,800 calories each day, this is less than 60 grams (g) of fat per day.  Fat is an important part of a healthy diet. Eating a low-fat diet can make it hard to maintain a healthy body weight. Ask your dietitian how much fat, calories, and other nutrients you need each day.  Eat small, frequent meals throughout the day instead of three large meals.  Drink at least 8-10 cups of fluid a day. Drink enough fluid to keep your urine clear or pale yellow.  Limit alcohol intake to no more than 1 drink a day for nonpregnant women and 2 drinks a day for men. One drink equals 12 oz of beer, 5 oz of wine, or 1 oz of hard liquor. Reading food labels  Check Nutrition Facts on food labels for the amount of fat per serving. Choose foods with less than 3 grams of fat per serving. Shopping  Choose nonfat and low-fat healthy foods. Look for the words nonfat, low fat, or fat free.  Avoid buying processed or prepackaged foods. Cooking  Cook using low-fat  methods, such as baking, broiling, grilling, or boiling.  Cook with small amounts of healthy fats, such as olive oil, grapeseed oil, canola oil, or sunflower oil. What foods are recommended?   All fresh, frozen, or canned fruits and vegetables.  Whole grains.  Low-fat or non-fat (skim) milk and yogurt.  Lean meat, skinless poultry, fish, eggs, and beans.  Low-fat protein supplement powders or drinks.  Spices and herbs. What foods are not recommended?  High-fat foods. These include baked goods, fast food, fatty cuts of meat, ice cream, french toast, sweet rolls, pizza, cheese bread, foods covered with butter, creamy sauces, or cheese.  Fried foods. These include french fries, tempura, battered fish, breaded chicken, fried breads, and sweets.  Foods with strong odors.  Foods that cause bloating and gas. Summary  A low-fat diet can be helpful if you have a gallbladder condition, or before and after gallbladder surgery.  Limit your fat intake to less than 30% of your total daily calories. This is about 60 g of fat if you eat 1,800 calories each day.  Eat small, frequent meals throughout the day instead of three large meals. This information is not intended to replace advice given to you by your health care provider. Make sure you discuss any questions you have with your health care provider. Document Revised: 10/03/2018 Document Reviewed: 07/20/2016 Elsevier Patient Education  [place].

## 2019-12-31 ENCOUNTER — Encounter: Payer: Self-pay | Admitting: Internal Medicine

## 2019-12-31 ENCOUNTER — Telehealth: Payer: Self-pay | Admitting: Internal Medicine

## 2019-12-31 LAB — BMP8+ANION GAP
Anion Gap: 13 mmol/L (ref 10.0–18.0)
BUN/Creatinine Ratio: 19 (ref 9–23)
BUN: 10 mg/dL (ref 6–24)
CO2: 26 mmol/L (ref 20–29)
Calcium: 9 mg/dL (ref 8.7–10.2)
Chloride: 104 mmol/L (ref 96–106)
Creatinine, Ser: 0.53 mg/dL — ABNORMAL LOW (ref 0.57–1.00)
GFR calc Af Amer: 121 mL/min/{1.73_m2} (ref >59)
GFR calc non Af Amer: 105 mL/min/{1.73_m2} (ref >59)
Glucose: 79 mg/dL (ref 65–99)
Potassium: 3.4 mmol/L — ABNORMAL LOW (ref 3.5–5.2)
Sodium: 143 mmol/L (ref 134–144)

## 2019-12-31 MED ORDER — POTASSIUM CHLORIDE ER 20 MEQ PO TBCR: 40.0000 meq | EXTENDED_RELEASE_TABLET | Freq: Every day | ORAL | 0 refills | Status: DC

## 2019-12-31 MED FILL — POTASSIUM CHLORIDE CRYS ER: 20 | 30 days supply | Qty: 60 | Fill #0

## 2019-12-31 NOTE — Progress Notes (Addendum)
CC: Diarrhea  HPI: [name] is a 59 y.o. with PMH listed below presenting with complaint of diarrhea. Please see problem based assessment and plan for further details.  Past Medical History:  Diagnosis Date  . Anemia   . Arthritis    "neck, hips" (04/25/2018)  . Claustrophobia   . Daily headache   . GERD (gastroesophageal reflux disease)   . Hiatal hernia   . Hyperlipidemia    "hx" (04/25/2018)  . Hypertension   . Plantar fasciitis    hx - left foot  . Stroke [facility]) 05/2014   "mini stroke" (04/25/2018)    Review of Systems: Review of Systems  Constitutional: Negative for chills, fever and malaise/fatigue.  Eyes: Negative for blurred vision.  Respiratory: Negative for shortness of breath.   Cardiovascular: Negative for chest pain, palpitations and leg swelling.  Gastrointestinal: Positive for diarrhea. Negative for abdominal pain, constipation, nausea and vomiting.  Genitourinary: Negative for dysuria, frequency and urgency.  All other systems reviewed and are negative.   Physical Exam: Vitals:   12/30/19 1017  BP: (!) 144/89  Pulse: 80  Temp: 98.8 F (37.1 C)  TempSrc: Oral  SpO2: 100%  Weight: 126 lb 8 oz (57.4 kg)  Height: 4\' 9"  (1.448 m)   Physical Exam Constitutional:      General: She is not in acute distress.    Appearance: She is normal weight.  HENT:     Mouth/Throat:     Mouth: Mucous membranes are moist.     Pharynx: Oropharynx is clear.  Eyes:     Conjunctiva/sclera: Conjunctivae normal.  Cardiovascular:     Rate and Rhythm: Normal rate and regular rhythm.     Pulses: Normal pulses.     Heart sounds: No murmur heard.   Pulmonary:     Effort: Pulmonary effort is normal.     Breath sounds: Normal breath sounds. No wheezing or rales.  Abdominal:     General: Abdomen is flat. Bowel sounds are normal. There is no distension.     Palpations: Abdomen is soft.     Tenderness: There is no abdominal tenderness.  Musculoskeletal:         General: No swelling. Normal range of motion.  Skin:    General: Skin is warm and dry.  Neurological:     Mental Status: She is alert.     Assessment & Plan:   Chronic diarrhea [name] is a 59 yo F w/ PMH of chronic diarrhea presenting to [facility] for f/u visit. She mentions that she had followed up with Dr.Perry with Gi and was told she needs to get further testing with CT Abd/pelvis and stool testing for fecal elastase. She mentions that she has been unable to provide stool sample. She also mentions not willing to go get her CT abd/pelvis done due to claustrophobia. She states that she has had similar claustrophobia symptoms and becomes extremely distressed even while driving through tunnels. She states that her diarrhea has improved and her stools are now formed but inquires to how long she needs to take her Creon or potassium supplements.  A/P Presents w/ chronic diarrhea. Improved with Creon which suggest pancreatic insufficiency. Symptoms significantly improved. Discussed importance of completing work-up to direct therapy. Discussed taking meds for anxiety for her CT. [name] is agreeable  - Hydroxyzine prn for CT abd/pelvis (scheduled for 01/06/20) - C/w Creon - F/u with GI  Hypokalemia K 3->3.4 after starting oral K 98mEq daily. Due to chronic diarrhea. Mentions  improvement in symptoms. Denies any numbness, tingling, weakness.  - Will check bmp - C/w KCl 36meq  Cervical radiculitis Seen by Dr.Yates, considering discectomy. Current using supportive meds. Continues to have pain.  Per NSQIP risk calculator, [name] has 3.8% risk of any complication and 1.6% risk of serious complication if she were to undergo discectomy due common risk factors such as hypertension and obesity. She is able to perform >4 metabolic equivalent for her functional status and does not require any further cardiac work-up.  - F/u w/ Ortho    Patient discussed with Dr. Daryll Drown  -Gilberto Better,  [facility] Pager: [phone]

## 2019-12-31 NOTE — Telephone Encounter (Signed)
Pt rtn call to Dr. Truman Hayward about her test results.

## 2019-12-31 NOTE — Telephone Encounter (Signed)
Attempted to call [name] to discuss her hypokalemia. Patient did not pick up. Left voicemail with call-back number

## 2019-12-31 NOTE — Assessment & Plan Note (Signed)
K 3->3.4 after starting oral K 83mEq daily. Due to chronic diarrhea. Mentions improvement in symptoms. Denies any numbness, tingling, weakness.  - Will check bmp - C/w KCl 101meq

## 2019-12-31 NOTE — Telephone Encounter (Signed)
Spoke with [name] regarding her continued hypokalemia and need for potassium supplementation. Advised to increase dose as she has been on 20meq without improvement in her potassium and f/u in clinic for repeat lab work. [name] expressed understanding.

## 2019-12-31 NOTE — Progress Notes (Signed)
Internal Medicine Clinic Attending  Case discussed with Dr. Lee  At the time of the visit.  We reviewed the resident's history and exam and pertinent patient test results.  I agree with the assessment, diagnosis, and plan of care documented in the resident's note.    

## 2019-12-31 NOTE — Assessment & Plan Note (Signed)
[name] is a 59 yo F w/ PMH of chronic diarrhea presenting to [facility] for f/u visit. She mentions that she had followed up with Dr.Perry with Gi and was told she needs to get further testing with CT Abd/pelvis and stool testing for fecal elastase. She mentions that she has been unable to provide stool sample. She also mentions not willing to go get her CT abd/pelvis done due to claustrophobia. She states that she has had similar claustrophobia symptoms and becomes extremely distressed even while driving through tunnels. She states that her diarrhea has improved and her stools are now formed but inquires to how long she needs to take her Creon or potassium supplements.  A/P Presents w/ chronic diarrhea. Improved with Creon which suggest pancreatic insufficiency. Symptoms significantly improved. Discussed importance of completing work-up to direct therapy. Discussed taking meds for anxiety for her CT. [name] is agreeable  - Hydroxyzine prn for CT abd/pelvis (scheduled for 01/06/20) - C/w Creon - F/u with GI

## 2020-01-06 ENCOUNTER — Encounter (HOSPITAL_COMMUNITY): Payer: Self-pay

## 2020-01-06 ENCOUNTER — Other Ambulatory Visit: Payer: Self-pay

## 2020-01-06 ENCOUNTER — Ambulatory Visit (HOSPITAL_COMMUNITY)
Admission: RE | Admit: 2020-01-06 | Discharge: 2020-01-06 | Disposition: A | Discharge status: Home / Self Care | Payer: 59 | Source: Ambulatory Visit | Attending: Internal Medicine | Admitting: Internal Medicine

## 2020-01-06 DIAGNOSIS — R197 Diarrhea, unspecified: Secondary | ICD-10-CM | POA: Insufficient documentation

## 2020-01-06 DIAGNOSIS — K909 Intestinal malabsorption, unspecified: Secondary | ICD-10-CM | POA: Insufficient documentation

## 2020-01-06 MED ORDER — IOHEXOL 300 MG/ML  SOLN
100.0000 mL | Freq: Once | INTRAMUSCULAR | Status: AC | PRN
  Administered 2020-01-06: 100 mL via INTRAVENOUS

## 2020-01-06 MED ORDER — SODIUM CHLORIDE (PF) 0.9 % IJ SOLN
INTRAMUSCULAR | Status: AC
  Filled 2020-01-06: qty 50

## 2020-01-13 ENCOUNTER — Telehealth: Payer: Self-pay

## 2020-01-13 MED FILL — POTASSIUM CHLORIDE CRYS ER: 20 | 30 days supply | Qty: 60 | Fill #0

## 2020-01-13 NOTE — Telephone Encounter (Signed)
Requesting CT scan results, please call pt back. 

## 2020-01-13 NOTE — Telephone Encounter (Signed)
Spoke with [name] regarding her CT results. Advised to f/u with Dr.Perry for further work-up. She expresses concern regarding seeing different provider at internal medicine clinic and requests to see only one physician. Advised that she will need to see an attending from now on due to the nature of the residency program. [name] expressed understanding.

## 2020-01-14 NOTE — Assessment & Plan Note (Addendum)
Seen by Dr.Yates, considering discectomy. Current using supportive meds. Continues to have pain.  Per NSQIP risk calculator, [name] has 3.8% risk of any complication and 9.8% risk of serious complication if she were to undergo discectomy due common risk factors such as hypertension and obesity. She is able to perform >4 metabolic equivalent for her functional status and does not require any further cardiac work-up.  - F/u w/ Ortho

## 2020-01-22 MED FILL — AMLODIPINE 2.5 MG TABLET: 2.5 | 90 days supply | Qty: 90 | Fill #0

## 2020-01-22 MED FILL — LISINOPRIL 20 MG TABLET: 20 | 90 days supply | Qty: 90 | Fill #0

## 2020-01-24 ENCOUNTER — Encounter (HOSPITAL_COMMUNITY): Payer: Self-pay | Admitting: Physician Assistant

## 2020-01-24 ENCOUNTER — Ambulatory Visit (HOSPITAL_COMMUNITY)
Admission: EM | Admit: 2020-01-24 | Discharge: 2020-01-24 | Disposition: A | Discharge status: Home / Self Care | Payer: 59

## 2020-01-24 ENCOUNTER — Other Ambulatory Visit: Payer: Self-pay

## 2020-01-24 DIAGNOSIS — M25562 Pain in left knee: Secondary | ICD-10-CM

## 2020-01-24 NOTE — Discharge Instructions (Signed)
Continue with ice and rest today.  Continue with Tylenol as needed.  Contact Orthopedic office on Monday if you are still experiencing pain.

## 2020-01-24 NOTE — ED Provider Notes (Signed)
[place]    CSN: [id number] Arrival date & time: 01/24/20  1007      History   Chief Complaint Chief Complaint  Patient presents with   Knee Pain    HPI [name] is a 59 y.o. female.   Pt complains of left knee pain that started yesterday while walking.  Denies injury, fall, trauma.  She reports difficulty with straightening the leg this morning.  She has taken tylenol with some improvement.  No previous knee pain.  Denies swelling, bruising, or instability.  She reports pain is worse with walking and movement.  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  She states that she will be able to work tomorrow as she is scheduled for a receptionist position, sitting for the whole shift. Pt is currently being followed by orthopedics for cervical pain/radiculopathy, anticipate upcoming cervical discectomy with Dr. Lorin Mercy.  Dr. Lorin Mercy has prescribed her norco in the past, she only take prn and has not tried this for the knee pain.  She can tolerate NSAIDS if she eats something before.       Past Medical History:  Diagnosis Date   Anemia    Arthritis    "neck, hips" (04/25/2018)   Claustrophobia    Daily headache    GERD (gastroesophageal reflux disease)    Hiatal hernia    Hyperlipidemia    "hx" (04/25/2018)   Hypertension    Plantar fasciitis    hx - left foot   Stroke ([place]) 05/2014   "mini stroke" (04/25/2018)    Patient Active Problem List   Diagnosis Date Noted   Hypokalemia 12/17/2019   Chronic diarrhea 09/14/2017   H/O TIA (transient ischemic attack) and stroke 06/12/2014   Cervical radiculitis 02/12/2014   Healthcare maintenance 04/30/2013   Essential hypertension, benign 01/24/2010   Microcytic anemia 01/11/2010   Hyperlipemia 05/28/2007   TOBACCO ABUSE 05/28/2007   GERD 05/28/2007    Past Surgical History:  Procedure Laterality Date   ABDOMINAL HYSTERECTOMY  12/09   Secondary to  fibroids   ARTERY BIOPSY Right 04/10/2018   Procedure: BIOPSY TEMPORAL ARTERY;  Surgeon: Rosetta Posner, MD;  Location: [facility] OR;  Service: Vascular;  Laterality: Right;   [place] [place] Left 03/2017   Bone Spurs Removed   RADIOLOGY WITH ANESTHESIA N/A 04/26/2018   Procedure: MRI WITH ANESTHESIA;  Surgeon: Radiologist, Medication, MD;  Location: [place];  Service: Radiology;  Laterality: N/A;   RADIOLOGY WITH ANESTHESIA N/A 08/24/2019   Procedure: MRI WITH ANESTHESIA CERVICAL SPINE;  Surgeon: Radiologist, Medication, MD;  Location: [place];  Service: Radiology;  Laterality: N/A;   RADIOLOGY WITH ANESTHESIA N/A 10/02/2019   Procedure: MRI WITH ANESTHESIA CERVICAL WITHOUT CONTRAST;  Surgeon: Radiologist, Medication, MD;  Location: [place];  Service: Radiology;  Laterality: N/A;   TUBAL LIGATION  1988    OB History   No obstetric history on file.      Home Medications    Prior to Admission medications   Medication Sig Start Date End Date Taking? Authorizing Provider  acetaminophen (TYLENOL) 500 MG tablet Take 1 tablet (500 mg total) by mouth every 6 (six) hours as needed for moderate pain. 10/16/19  Yes Corena Herter, PA-C  amLODipine (NORVASC) 2.5 MG tablet Take 1 tablet (2.5 mg total) by mouth daily. 11/20/19  Yes Bartholomew Crews, MD  ferrous sulfate (IRON SUPPLEMENT)  325 (65 FE) MG tablet Take 1 tablet (325 mg total) by mouth daily with breakfast. 03/01/16  Yes Bartholomew Crews, MD  HYDROcodone-acetaminophen (NORCO/VICODIN) 5-325 MG tablet Take 1 tablet by mouth at bedtime as needed. Take tylenol 1000 mg and ibuprofen 600 mg with food three times a day for 2 weeks. 11/20/19  Yes Bartholomew Crews, MD  hydrOXYzine (ATARAX/VISTARIL) 10 MG tablet Take 1 tablet (10 mg total) by mouth 3 (three) times daily as needed for anxiety. 12/30/19  Yes Mosetta Anis, MD  lipase/protease/amylase (CREON) 36000 UNITS CPEP capsule Take 1 capsule (36,000  Units total) by mouth 3 (three) times daily before meals. Take 2 capsules with each meal and one with a snack 12/08/19  Yes Irene Shipper, MD  lisinopril (ZESTRIL) 20 MG tablet Take 1 tablet (20 mg total) by mouth daily. 11/20/19  Yes Bartholomew Crews, MD  omeprazole (PRILOSEC) 40 MG capsule TAKE 1 CAPSULE BY MOUTH DAILY. Patient taking differently: Take 40 mg by mouth daily.  05/26/19  Yes Bartholomew Crews, MD  Potassium Chloride ER 20 MEQ TBCR Take 40 mEq by mouth daily. 12/31/19  Yes Mosetta Anis, MD  AMBULATORY NON FORMULARY MEDICATION Medication Name: nitroglycerin 0.125 mg ointment mixed with 2% lidocaine three times a day for 6-8 weeks 05/09/18   Bartholomew Crews, MD  hydrocortisone (ANUSOL-HC) 2.5 % rectal cream Place 1 application rectally 2 (two) times daily. Patient taking differently: Place 1 application rectally 2 (two) times daily as needed for hemorrhoids.  05/09/18   Bartholomew Crews, MD  pramoxine (PROCTOFOAM) 1 % foam Place 1 application rectally 3 (three) times daily as needed for anal itching. 11/25/18   Modena Nunnery D, DO    Family History Family History  Problem Relation Age of Onset   Cancer Mother 55       pancreatic cancer   Pancreatic cancer Mother    Heart disease Mother    Cancer Father 98       throat cancer   Esophageal cancer Father    Cancer Maternal Aunt 56       breast cancer   Cancer Other        Died  from Colon cancer in 60's   Colon cancer Maternal Uncle    Colon cancer Paternal Uncle    Heart disease Sister    Rectal cancer Neg Hx    Stomach cancer Neg Hx     Social History Social History   Tobacco Use   Smoking status: Current Some Day Smoker    Packs/day: 0.25    Years: 38.00    Pack years: 9.50    Types: Cigarettes   Smokeless tobacco: Never Used   Tobacco comment: 0-4 cigs/day   Vaping Use   Vaping Use: Never used  Substance Use Topics   Alcohol use: Yes    Alcohol/week: 4.0 standard drinks     Types: 4 Cans of beer per week   Drug use: Not Currently     Allergies   Aspirin and Hydromorphone hcl   Review of Systems Review of Systems  Constitutional: Negative for chills and fever.  HENT: Negative for ear pain and sore throat.   Eyes: Negative for pain and visual disturbance.  Respiratory: Negative for cough and shortness of breath.   Cardiovascular: Negative for chest pain and palpitations.  Gastrointestinal: Negative for abdominal pain and vomiting.  Genitourinary: Negative for dysuria and hematuria.  Musculoskeletal: Positive for arthralgias (left knee pain) and gait  problem (left knee pain with ambulation). Negative for back pain.  Skin: Negative for color change, rash and wound.  Neurological: Negative for seizures and syncope.  All other systems reviewed and are negative.    Physical Exam Triage Vital Signs ED Triage Vitals  Enc Vitals Group     BP 01/24/20 1027 (!) 143/86     Pulse Rate 01/24/20 1027 85     Resp 01/24/20 1027 18     Temp 01/24/20 1027 98.4 F (36.9 C)     Temp Source 01/24/20 1027 Oral     SpO2 01/24/20 1027 99 %     Weight --      Height --      Head Circumference --      Peak Flow --      Pain Score 01/24/20 1025 9     Pain Loc --      Pain Edu? --      Excl. in [place]? --    No data found.  Updated Vital Signs BP (!) 143/86 (BP Location: Left Arm)    Pulse 85    Temp 98.4 F (36.9 C) (Oral)    Resp 18    LMP  (LMP Unknown)    SpO2 99%   Visual Acuity Right Eye Distance:   Left Eye Distance:   Bilateral Distance:    Right Eye Near:   Left Eye Near:    Bilateral Near:     Physical Exam Vitals and nursing note reviewed.  Constitutional:      General: She is not in acute distress.    Appearance: She is well-developed.  HENT:     Head: Normocephalic and atraumatic.  Eyes:     Conjunctiva/sclera: Conjunctivae normal.  Cardiovascular:     Rate and Rhythm: Normal rate and regular rhythm.     Heart sounds: No murmur heard.     Pulmonary:     Effort: Pulmonary effort is normal. No respiratory distress.     Breath sounds: Normal breath sounds.  Abdominal:     Palpations: Abdomen is soft.     Tenderness: There is no abdominal tenderness.  Musculoskeletal:     Cervical back: Neck supple.     Right knee: Normal.     Left knee: No swelling, deformity, effusion, erythema, lacerations or crepitus. Normal range of motion. Tenderness present over the medial joint line and lateral joint line. No patellar tendon tenderness. No LCL laxity, MCL laxity, ACL laxity or PCL laxity.Normal pulse.     Instability Tests: Anterior drawer test negative. Posterior drawer test negative.  Skin:    General: Skin is warm and dry.  Neurological:     Mental Status: She is alert.      UC Treatments / Results  Labs (all labs ordered are listed, but only abnormal results are displayed) Labs Reviewed - No data to display  EKG   Radiology No results found.  Procedures Procedures (including critical care time)  Medications Ordered in UC Medications - No data to display  Initial Impression / Assessment and Plan / UC Course  I have reviewed the triage vital signs and the nursing notes.  Pertinent labs & imaging results that were available during my care of the patient were reviewed by me and considered in my medical decision making (see chart for details).     Pt with medial and lateral joint line tenderness on exam without effusion consistent with exacerbation of knee OA.  No joint laxity, normal ROM, can  weight bear without difficulty.  Recommend she continue Tyelenol as needed.  Pt does has Norco prescribed by Orthopedics that she can take prn.  She will contact Ortho on Monday if she continues to experience pain.  Work note given for today.  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  Final Clinical Impressions(s) / UC Diagnoses   Final diagnoses:  None   Discharge  Instructions   None    ED Prescriptions    None     PDMP not reviewed this encounter.   Konrad Felix, PA-C 01/24/20 1049

## 2020-01-24 NOTE — ED Triage Notes (Signed)
Pt c/o acute left knee pain onset yesterday while walking. Pt states she had difficulty extending left leg this morning with pain 9/10. Denies known trauma/injury. Pt reports she took x4 325mg  tylenol (1300mg ) at 0600 this morning Medial aspect left knee slightly edematous, tender to touch. Neurovascular intact to left foot/toes.

## 2020-02-12 ENCOUNTER — Telehealth: Payer: Self-pay | Admitting: Internal Medicine

## 2020-02-12 NOTE — Telephone Encounter (Signed)
Pt states she has 2 creon to take today, the pharmacy should have some in tomorrow. She states since she has been on the creon her stools have not been formed. Reports she started having diarrhea again last week 3-4 diarrhea stools/day. Pt will keep her appt with Dr. Henrene Pastor Friday and see what he recommends.

## 2020-02-12 NOTE — Telephone Encounter (Signed)
Pt is requesting a call back from a nurse, did not specify the exact reason

## 2020-02-13 ENCOUNTER — Encounter: Payer: Self-pay | Admitting: Internal Medicine

## 2020-02-13 ENCOUNTER — Other Ambulatory Visit: Payer: Self-pay | Admitting: Internal Medicine

## 2020-02-13 ENCOUNTER — Ambulatory Visit (INDEPENDENT_AMBULATORY_CARE_PROVIDER_SITE_OTHER): Payer: 59 | Admitting: Internal Medicine

## 2020-02-13 VITALS — BP 144/88 | HR 83 | Ht <= 58 in | Wt 128.0 lb

## 2020-02-13 DIAGNOSIS — K529 Noninfective gastroenteritis and colitis, unspecified: Secondary | ICD-10-CM | POA: Diagnosis not present

## 2020-02-13 DIAGNOSIS — R634 Abnormal weight loss: Secondary | ICD-10-CM | POA: Diagnosis not present

## 2020-02-13 DIAGNOSIS — K8689 Other specified diseases of pancreas: Secondary | ICD-10-CM | POA: Diagnosis not present

## 2020-02-13 MED ORDER — PANCRELIPASE (LIP-PROT-AMYL) 36000-114000 UNITS PO CPEP: ORAL_CAPSULE | ORAL | 6 refills | Status: DC

## 2020-02-13 MED FILL — CREON DR 36,000 UNITS CAP: 36000-11400 | 30 days supply | Qty: 330 | Fill #0

## 2020-02-13 NOTE — Progress Notes (Signed)
HISTORY OF PRESENT ILLNESS:  [name] is a 59 y.o. female who I saw in the office December 01, 2019 regarding chronic postprandial diarrhea and weight loss.  As pancreatic insufficiency was a consideration, she was placed on Creon 36,000 units of lipase take with meals and snacks.  After initiating therapy, she had abrupt resolution of her diarrhea.  At that point she was having to formed bowel movements per day.  She follows up at this time.  She has gained approximately 6 pounds since her last visit.  She does tell me that over the past week she seems to have had somewhat worsening diarrhea, now describing 5 looser stools per day.  No other symptoms.  She has been compliant with her Creon therapy.  REVIEW OF SYSTEMS:  All non-GI ROS negative unless otherwise stated in the HPI except for arthritis  Past Medical History:  Diagnosis Date  . Anemia   . Arthritis    "neck, hips" (04/25/2018)  . Claustrophobia   . Daily headache   . GERD (gastroesophageal reflux disease)   . Hiatal hernia   . Hyperlipidemia    "hx" (04/25/2018)  . Hypertension   . Plantar fasciitis    hx - left foot  . Stroke [facility]) 05/2014   "mini stroke" (04/25/2018)    Past Surgical History:  Procedure Laterality Date  . ABDOMINAL HYSTERECTOMY  12/09   Secondary to fibroids  . ARTERY BIOPSY Right 04/10/2018   Procedure: BIOPSY TEMPORAL ARTERY;  Surgeon: Rosetta Posner, MD;  Location: [name];  Service: Vascular;  Laterality: Right;  . [place]; 1988  . COLONOSCOPY    . FOOT SURGERY Left 03/2017   Bone Spurs Removed  . RADIOLOGY WITH ANESTHESIA N/A 04/26/2018   Procedure: MRI WITH ANESTHESIA;  Surgeon: Radiologist, Medication, MD;  Location: [place];  Service: Radiology;  Laterality: N/A;  . RADIOLOGY WITH ANESTHESIA N/A 08/24/2019   Procedure: MRI WITH ANESTHESIA CERVICAL SPINE;  Surgeon: Radiologist, Medication, MD;  Location: [place];  Service: Radiology;  Laterality: N/A;  . RADIOLOGY WITH ANESTHESIA  N/A 10/02/2019   Procedure: MRI WITH ANESTHESIA CERVICAL WITHOUT CONTRAST;  Surgeon: Radiologist, Medication, MD;  Location: [place];  Service: Radiology;  Laterality: N/A;  . TUBAL LIGATION  1988    Social History [name]  reports that she has been smoking cigarettes. She has a 9.50 pack-year smoking history. She has never used smokeless tobacco. She reports current alcohol use of about 4.0 standard drinks of alcohol per week. She reports previous drug use.  family history includes Cancer in an other family member; Cancer (age of onset: 35) in her maternal aunt; Cancer (age of onset: 25) in her mother; Cancer (age of onset: 36) in her father; Colon cancer in her maternal uncle and paternal uncle; Esophageal cancer in her father; Heart disease in her mother and sister; Pancreatic cancer in her mother.  Allergies  Allergen Reactions  . Aspirin Hives  . Hydromorphone Hcl Hives and Nausea And Vomiting       PHYSICAL EXAMINATION: Vital signs: BP (!) 144/88   Pulse 83   Ht 4\' 10"  (1.473 m)   Wt 128 lb (58.1 kg)   LMP  (LMP Unknown)   BMI 26.75 kg/m   Constitutional: generally well-appearing, no acute distress Psychiatric: alert and oriented x3, cooperative Eyes: extraocular movements intact, anicteric, conjunctiva pink Mouth: oral pharynx moist, no lesions Neck: supple no lymphadenopathy Cardiovascular: heart regular rate and rhythm, no murmur Lungs: clear  to auscultation bilaterally Abdomen: soft, nontender, nondistended, no obvious ascites, no peritoneal signs, normal bowel sounds, no organomegaly Rectal: Omitted Extremities: no lower extremity edema bilaterally Skin: no lesions on visible extremities Neuro: No focal deficits. No asterixis.    ASSESSMENT:  1.  Pancreatic insufficiency by history and response to empiric therapy.  Did not submit stool for pancreatic elastase as recommended.  Now with somewhat loose stools over the past week.  May need slight dose  adjustment of her enzyme replacement 2.  Weight loss.  Improved with enzyme supplementation   PLAN:  1.  Increase Creon from 2 with meals and 1 with snacks to 3 with meals and 2 with snacks.  Prescription rewritten 2.  Office follow-up 3 months 3.  Contact the office in the interim for any questions or problems

## 2020-02-13 NOTE — Patient Instructions (Signed)
We have sent the following medications to your pharmacy for you to pick up at your convenience:  Creon  Please follow up in three months

## 2020-02-17 ENCOUNTER — Emergency Department (HOSPITAL_COMMUNITY)
Admission: EM | Admit: 2020-02-17 | Discharge: 2020-02-17 | Disposition: A | Discharge status: Home / Self Care | Payer: 59 | Attending: Emergency Medicine | Admitting: Emergency Medicine

## 2020-02-17 ENCOUNTER — Emergency Department (HOSPITAL_COMMUNITY): Payer: 59

## 2020-02-17 ENCOUNTER — Other Ambulatory Visit: Payer: Self-pay

## 2020-02-17 DIAGNOSIS — M25552 Pain in left hip: Secondary | ICD-10-CM | POA: Insufficient documentation

## 2020-02-17 DIAGNOSIS — Y999 Unspecified external cause status: Secondary | ICD-10-CM | POA: Diagnosis not present

## 2020-02-17 DIAGNOSIS — Y939 Activity, unspecified: Secondary | ICD-10-CM | POA: Insufficient documentation

## 2020-02-17 DIAGNOSIS — Y92410 Unspecified street and highway as the place of occurrence of the external cause: Secondary | ICD-10-CM | POA: Diagnosis not present

## 2020-02-17 DIAGNOSIS — Z5321 Procedure and treatment not carried out due to patient leaving prior to being seen by health care provider: Secondary | ICD-10-CM | POA: Insufficient documentation

## 2020-02-17 DIAGNOSIS — R0789 Other chest pain: Secondary | ICD-10-CM | POA: Insufficient documentation

## 2020-02-17 NOTE — ED Notes (Signed)
Pt stating if she had to wait too long she was leaving.
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
## 2020-02-17 NOTE — Progress Notes (Signed)
Patient refusing CT at this time due to claustrophobia, states she requires sedation for CT. Patient advised this will delay care. Pt still refusing at this time.

## 2020-02-18 ENCOUNTER — Ambulatory Visit (INDEPENDENT_AMBULATORY_CARE_PROVIDER_SITE_OTHER): Payer: 59 | Admitting: Internal Medicine

## 2020-02-18 ENCOUNTER — Other Ambulatory Visit: Payer: Self-pay | Admitting: Internal Medicine

## 2020-02-18 ENCOUNTER — Encounter: Payer: Self-pay | Admitting: Internal Medicine

## 2020-02-18 VITALS — BP 167/93 | HR 85 | Temp 98.4°F | Ht <= 58 in | Wt 125.5 lb

## 2020-02-18 DIAGNOSIS — E876 Hypokalemia: Secondary | ICD-10-CM | POA: Diagnosis not present

## 2020-02-18 DIAGNOSIS — Z041 Encounter for examination and observation following transport accident: Secondary | ICD-10-CM | POA: Diagnosis not present

## 2020-02-18 DIAGNOSIS — I10 Essential (primary) hypertension: Secondary | ICD-10-CM

## 2020-02-18 MED ORDER — DICLOFENAC SODIUM 1 % EX GEL: 2.0000 g | Freq: Four times a day (QID) | CUTANEOUS | 1 refills | Status: DC

## 2020-02-18 MED FILL — DICLOFENAC SODIUM 1 % GEL: 1 | 13 days supply | Qty: 100 | Fill #0

## 2020-02-18 NOTE — Assessment & Plan Note (Signed)
Patient is on lisinopril 20 mg daily and amlodipine 2.5 mg daily.  Her blood pressure today is 167/93.  She mentions that her blood pressure at home is always around 140s over 80s. She might have whitecoat hypertension, will need to check her blood pressure device at the same time.  -After shared decision-making, we decided to continue current dose of antihypertensive medications for now -I asked her to bring her blood pressure device next time
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
## 2020-02-18 NOTE — Assessment & Plan Note (Addendum)
Patient has been on 40 mEq potassium supplement daily. Her chronic diarrhea improved after she was started on Creon.  She mentions that except one time of diarrhea last week, she had normal bowel movement. I am concerned that she may develop hyperkalemia, if she continues potassium supplement every day when she does not have GI loss anymore. (Is also on lisinopril)  -Checking BMP today -We will decide about potassium supplement accordingly  ADDENDUM: K is normal at 3.6 but not as high as we expected, while patient is on KCL 40 meq QD.  I talked to patient. She now mentions that she sometimes has loose stool. Her bowel movement is improving on Creon but given she still has loose stool, will continue potassium supplement but on a lower dose.  Instructed to get extra dose if developed diarrhea. -KCL supplement 40 meq, 3 times a week and additional dose on the days that she has diarrhea.  -Will repeat BMP in 2 weeks and depend on symptoms improvement and K result, will adjust the supplement dose. -Patient agrees with plan and will follow up in clinic in 2 weeks.

## 2020-02-19 LAB — BMP8+ANION GAP
Anion Gap: 14 mmol/L (ref 10.0–18.0)
BUN/Creatinine Ratio: 25 — ABNORMAL HIGH (ref 9–23)
BUN: 13 mg/dL (ref 6–24)
CO2: 24 mmol/L (ref 20–29)
Calcium: 9.5 mg/dL (ref 8.7–10.2)
Chloride: 105 mmol/L (ref 96–106)
Creatinine, Ser: 0.53 mg/dL — ABNORMAL LOW (ref 0.57–1.00)
GFR calc Af Amer: 121 mL/min/{1.73_m2} (ref >59)
GFR calc non Af Amer: 105 mL/min/{1.73_m2} (ref >59)
Glucose: 81 mg/dL (ref 65–99)
Potassium: 3.6 mmol/L (ref 3.5–5.2)
Sodium: 143 mmol/L (ref 134–144)

## 2020-02-23 NOTE — Progress Notes (Signed)
Internal Medicine Clinic Attending  Case discussed on DOS 02/18/20 with Dr. Myrtie Hawk  at the time of the visit.  We reviewed the resident's history and exam and pertinent patient test results.  I agree with the assessment, diagnosis, and plan of care documented in the resident's note.

## 2020-03-02 ENCOUNTER — Ambulatory Visit (INDEPENDENT_AMBULATORY_CARE_PROVIDER_SITE_OTHER): Payer: 59 | Admitting: Orthopaedic Surgery

## 2020-03-02 ENCOUNTER — Encounter: Payer: Self-pay | Admitting: Orthopaedic Surgery

## 2020-03-02 ENCOUNTER — Ambulatory Visit (INDEPENDENT_AMBULATORY_CARE_PROVIDER_SITE_OTHER): Payer: 59

## 2020-03-02 DIAGNOSIS — M5412 Radiculopathy, cervical region: Secondary | ICD-10-CM
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
## 2020-03-10 NOTE — Addendum Note (Signed)
Addended by: Aldine Contes on: 03/10/2020 10:27 AM   Modules accepted: Orders

## 2020-03-15 ENCOUNTER — Telehealth: Payer: Self-pay | Admitting: Internal Medicine

## 2020-03-15 NOTE — Telephone Encounter (Signed)
Called pt - informed "Please have her set up a tele appointment. The CT is open unlike an MRI and usually does not need pre medication" per Dr Dareen Piano. Pt became upset, stated Dr Lynnae January always order her something,she's claustrophobic, "we do not have her body", and hung up the phone.

## 2020-03-15 NOTE — Telephone Encounter (Signed)
Called pt - no answer;no answer, left message to call the office about CT scan/medication.

## 2020-03-15 NOTE — Telephone Encounter (Signed)
Called pt back - no answer; left message to call the office.

## 2020-03-15 NOTE — Telephone Encounter (Signed)
I apologize. I have not met her but also I did not order the CT for her. If she needed pre medication she should ask the provider who ordered it for her. We usually don't prescribe anxiety meds for CT.

## 2020-03-15 NOTE — Telephone Encounter (Signed)
Pls contact pt regarding medicine for a c-scan [phone]

## 2020-03-15 NOTE — Telephone Encounter (Signed)
Please have her set up a tele appointment. The CT is open unlike an MRI and usually does not need pre medication.
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
## 2020-03-16 ENCOUNTER — Other Ambulatory Visit: Payer: Self-pay

## 2020-03-16 ENCOUNTER — Ambulatory Visit: Payer: Self-pay

## 2020-03-16 ENCOUNTER — Encounter: Payer: Self-pay | Admitting: Orthopaedic Surgery

## 2020-03-16 ENCOUNTER — Ambulatory Visit (INDEPENDENT_AMBULATORY_CARE_PROVIDER_SITE_OTHER): Payer: 59 | Admitting: Orthopaedic Surgery

## 2020-03-16 VITALS — Ht <= 58 in | Wt 130.0 lb

## 2020-03-16 DIAGNOSIS — M545 Low back pain, unspecified: Secondary | ICD-10-CM
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
## 2020-03-22 MED FILL — OMEPRAZOLE 40 MG CPDR: 40 | 90 days supply | Qty: 90 | Fill #3

## 2020-03-26 ENCOUNTER — Other Ambulatory Visit (HOSPITAL_COMMUNITY): Payer: Self-pay | Admitting: Internal Medicine

## 2020-03-26 MED FILL — DICLOFENAC SODIUM 1 % GEL: 1 | 13 days supply | Qty: 100 | Fill #1

## 2020-03-26 MED FILL — FLUARIX QUADRIVALENT 0.5 ML: 0.5 | 1 days supply | Qty: 1 | Fill #0

## 2020-03-26 MED FILL — CREON DR 36,000 UNITS CAP: 36000-11400 | 30 days supply | Qty: 330 | Fill #1

## 2020-03-26 MED FILL — POTASSIUM CHLORIDE CRYS ER: 20 | 30 days supply | Qty: 30 | Fill #1

## 2020-04-02 ENCOUNTER — Ambulatory Visit (INDEPENDENT_AMBULATORY_CARE_PROVIDER_SITE_OTHER): Payer: 59 | Admitting: Orthopaedic Surgery

## 2020-04-02 ENCOUNTER — Encounter: Payer: Self-pay | Admitting: Orthopaedic Surgery

## 2020-04-02 VITALS — BP 162/105 | HR 81

## 2020-04-02 DIAGNOSIS — M545 Low back pain, unspecified: Secondary | ICD-10-CM

## 2020-04-02 DIAGNOSIS — M5412 Radiculopathy, cervical region: Secondary | ICD-10-CM
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
## 2020-05-02 ENCOUNTER — Encounter (HOSPITAL_COMMUNITY): Payer: Self-pay | Admitting: *Deleted

## 2020-05-02 ENCOUNTER — Ambulatory Visit (INDEPENDENT_AMBULATORY_CARE_PROVIDER_SITE_OTHER): Payer: 59

## 2020-05-02 ENCOUNTER — Other Ambulatory Visit: Payer: Self-pay

## 2020-05-02 ENCOUNTER — Ambulatory Visit (HOSPITAL_COMMUNITY)
Admission: EM | Admit: 2020-05-02 | Discharge: 2020-05-02 | Disposition: A | Discharge status: Home / Self Care | Payer: 59 | Attending: Family Medicine | Admitting: Family Medicine

## 2020-05-02 ENCOUNTER — Telehealth (HOSPITAL_COMMUNITY): Payer: Self-pay | Admitting: Family Medicine

## 2020-05-02 DIAGNOSIS — R079 Chest pain, unspecified: Secondary | ICD-10-CM | POA: Diagnosis not present

## 2020-05-02 DIAGNOSIS — W19XXXA Unspecified fall, initial encounter: Secondary | ICD-10-CM

## 2020-05-02 DIAGNOSIS — S20212A Contusion of left front wall of thorax, initial encounter: Secondary | ICD-10-CM

## 2020-05-02 DIAGNOSIS — R0781 Pleurodynia: Secondary | ICD-10-CM | POA: Diagnosis not present

## 2020-05-02 MED ORDER — IBUPROFEN 600 MG PO TABS
600.0000 mg | ORAL_TABLET | Freq: Four times a day (QID) | ORAL | 0 refills | Status: DC | PRN
Start: 2020-05-02 — End: 2020-10-19

## 2020-05-02 MED ORDER — HYDROCODONE-ACETAMINOPHEN 5-325 MG PO TABS: 1.0000 | ORAL_TABLET | Freq: Four times a day (QID) | ORAL | 0 refills | Status: DC | PRN

## 2020-05-02 MED ORDER — HYDROCODONE-ACETAMINOPHEN 7.5-325 MG PO TABS: 1.0000 | ORAL_TABLET | Freq: Four times a day (QID) | ORAL | 0 refills | Status: DC | PRN
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
## 2020-05-02 NOTE — ED Triage Notes (Signed)
PT reports when she was stepping out of the tub last she slipped and fell on her Lt side . Pt now reports pain below Lt breast.

## 2020-05-02 NOTE — Telephone Encounter (Signed)
Needed to re send Rx

## 2020-05-02 NOTE — Discharge Instructions (Signed)
Take ibuprofen 3 times a day with food.  Make sure you are taking your Prilosec so this does not upset your stomach.  Stop the ibuprofen if you get heartburn Take hydrocodone as needed for severe pain.  Do not drive on hydrocodone Follow-up with your primary care doctor Make sure you take deep breaths, and cough when you need to, to prevent pneumonia

## 2020-05-08 ENCOUNTER — Encounter (HOSPITAL_COMMUNITY): Payer: Self-pay

## 2020-05-08 ENCOUNTER — Ambulatory Visit (INDEPENDENT_AMBULATORY_CARE_PROVIDER_SITE_OTHER): Payer: 59

## 2020-05-08 ENCOUNTER — Other Ambulatory Visit: Payer: Self-pay

## 2020-05-08 ENCOUNTER — Ambulatory Visit (HOSPITAL_COMMUNITY)
Admission: EM | Admit: 2020-05-08 | Discharge: 2020-05-08 | Disposition: A | Discharge status: Home / Self Care | Payer: 59 | Attending: Family Medicine | Admitting: Family Medicine

## 2020-05-08 DIAGNOSIS — R0602 Shortness of breath: Secondary | ICD-10-CM

## 2020-05-08 DIAGNOSIS — R079 Chest pain, unspecified: Secondary | ICD-10-CM | POA: Diagnosis not present

## 2020-05-08 DIAGNOSIS — S2242XA Multiple fractures of ribs, left side, initial encounter for closed fracture: Secondary | ICD-10-CM | POA: Diagnosis not present

## 2020-05-08 MED ORDER — HYDROCODONE-ACETAMINOPHEN 7.5-325 MG PO TABS: 1.0000 | ORAL_TABLET | Freq: Four times a day (QID) | ORAL | 0 refills | Status: DC | PRN

## 2020-05-08 NOTE — Discharge Instructions (Signed)
You have a rib fracture.  Refilling your pain medicine Make sure that you are deep breathing to avoid pneumonia.  Follow up with your doctor next week.
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
## 2020-05-08 NOTE — ED Triage Notes (Signed)
Pt presents for follow up with progressing left side rib pain from a fall in bath tub about a week ago: pt was evaluated and had no broken bones but states pain has progressed.

## 2020-05-12 ENCOUNTER — Telehealth: Payer: Self-pay | Admitting: Emergency Medicine

## 2020-05-12 ENCOUNTER — Encounter: Payer: Self-pay | Admitting: Emergency Medicine

## 2020-05-12 ENCOUNTER — Ambulatory Visit (INDEPENDENT_AMBULATORY_CARE_PROVIDER_SITE_OTHER): Payer: 59 | Admitting: Emergency Medicine

## 2020-05-12 ENCOUNTER — Other Ambulatory Visit: Payer: Self-pay

## 2020-05-12 ENCOUNTER — Other Ambulatory Visit: Payer: Self-pay | Admitting: Emergency Medicine

## 2020-05-12 VITALS — BP 169/100 | HR 96 | Temp 97.8°F | Resp 18 | Ht 59.0 in | Wt 134.0 lb

## 2020-05-12 DIAGNOSIS — S2242XA Multiple fractures of ribs, left side, initial encounter for closed fracture: Secondary | ICD-10-CM

## 2020-05-12 DIAGNOSIS — E785 Hyperlipidemia, unspecified: Secondary | ICD-10-CM | POA: Diagnosis not present

## 2020-05-12 DIAGNOSIS — Z7689 Persons encountering health services in other specified circumstances: Secondary | ICD-10-CM

## 2020-05-12 DIAGNOSIS — I10 Essential (primary) hypertension: Secondary | ICD-10-CM

## 2020-05-12 DIAGNOSIS — F17200 Nicotine dependence, unspecified, uncomplicated: Secondary | ICD-10-CM

## 2020-05-12 MED ORDER — ROSUVASTATIN CALCIUM 20 MG PO TABS: 20.0000 mg | ORAL_TABLET | Freq: Every day | ORAL | 3 refills | Status: DC

## 2020-05-12 MED FILL — ROSUVASTATIN CALCIUM 20 MG: 20 | 90 days supply | Qty: 90 | Fill #0
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
## 2020-05-12 NOTE — Telephone Encounter (Signed)
Pt called and wanted to talk to providers nurse regarding why she was put on cholesterol medication. She states she never discussed her cholesterol. Pt would like a call regarding this. Please advise.

## 2020-05-12 NOTE — Patient Instructions (Addendum)
   If you have lab work done today you will be contacted with your lab results within the next 2 weeks.  If you have not heard from us then please contact us. The fastest way to get your results is to register for My Chart.   IF you received an x-ray today, you will receive an invoice from [facility]. Please contact [facility] at [phone] with questions or concerns regarding your invoice.   IF you received labwork today, you will receive an invoice from [facility]. Please contact [facility] at [phone] with questions or concerns regarding your invoice.   Our billing staff will not be able to assist you with questions regarding bills from these companies.  You will be contacted with the lab results as soon as they are available. The fastest way to get your results is to activate your My Chart account. Instructions are located on the last page of this paperwork. If you have not heard from us regarding the results in 2 weeks, please contact this office.      Health Maintenance, Female Adopting a healthy lifestyle and getting preventive care are important in promoting health and wellness. Ask your health care provider about:  The right schedule for you to have regular tests and exams.  Things you can do on your own to prevent diseases and keep yourself healthy. What should I know about diet, weight, and exercise? Eat a healthy diet   Eat a diet that includes plenty of vegetables, fruits, low-fat dairy products, and lean protein.  Do not eat a lot of foods that are high in solid fats, added sugars, or sodium. Maintain a healthy weight Body mass index (BMI) is used to identify weight problems. It estimates body fat based on height and weight. Your health care provider can help determine your BMI and help you achieve or maintain a healthy weight. Get regular exercise Get regular exercise. This is one of the most important things you can do for your health. Most  adults should:  Exercise for at least 150 minutes each week. The exercise should increase your heart rate and make you sweat (moderate-intensity exercise).  Do strengthening exercises at least twice a week. This is in addition to the moderate-intensity exercise.  Spend less time sitting. Even light physical activity can be beneficial. Watch cholesterol and blood lipids Have your blood tested for lipids and cholesterol at 58 years of age, then have this test every 5 years. Have your cholesterol levels checked more often if:  Your lipid or cholesterol levels are high.  You are older than 58 years of age.  You are at high risk for heart disease. What should I know about cancer screening? Depending on your health history and family history, you may need to have cancer screening at various ages. This may include screening for:  Breast cancer.  Cervical cancer.  Colorectal cancer.  Skin cancer.  Lung cancer. What should I know about heart disease, diabetes, and high blood pressure? Blood pressure and heart disease  High blood pressure causes heart disease and increases the risk of stroke. This is more likely to develop in people who have high blood pressure readings, are of African descent, or are overweight.  Have your blood pressure checked: ? Every 3-5 years if you are 18-39 years of age. ? Every year if you are 40 years old or older. Diabetes Have regular diabetes screenings. This checks your fasting blood sugar level. Have the screening done:  Once every   three years after age 40 if you are at a normal weight and have a low risk for diabetes.  More often and at a younger age if you are overweight or have a high risk for diabetes. What should I know about preventing infection? Hepatitis B If you have a higher risk for hepatitis B, you should be screened for this virus. Talk with your health care provider to find out if you are at risk for hepatitis B infection. Hepatitis  C Testing is recommended for:  Everyone born from 1945 through 1965.  Anyone with known risk factors for hepatitis C. Sexually transmitted infections (STIs)  Get screened for STIs, including gonorrhea and chlamydia, if: ? You are sexually active and are younger than 58 years of age. ? You are older than 58 years of age and your health care provider tells you that you are at risk for this type of infection. ? Your sexual activity has changed since you were last screened, and you are at increased risk for chlamydia or gonorrhea. Ask your health care provider if you are at risk.  Ask your health care provider about whether you are at high risk for HIV. Your health care provider may recommend a prescription medicine to help prevent HIV infection. If you choose to take medicine to prevent HIV, you should first get tested for HIV. You should then be tested every 3 months for as long as you are taking the medicine. Pregnancy  If you are about to stop having your period (premenopausal) and you may become pregnant, seek counseling before you get pregnant.  Take 400 to 800 micrograms (mcg) of folic acid every day if you become pregnant.  Ask for birth control (contraception) if you want to prevent pregnancy. Osteoporosis and menopause Osteoporosis is a disease in which the bones lose minerals and strength with aging. This can result in bone fractures. If you are 65 years old or older, or if you are at risk for osteoporosis and fractures, ask your health care provider if you should:  Be screened for bone loss.  Take a calcium or vitamin D supplement to lower your risk of fractures.  Be given hormone replacement therapy (HRT) to treat symptoms of menopause. Follow these instructions at home: Lifestyle  Do not use any products that contain nicotine or tobacco, such as cigarettes, e-cigarettes, and chewing tobacco. If you need help quitting, ask your health care provider.  Do not use street  drugs.  Do not share needles.  Ask your health care provider for help if you need support or information about quitting drugs. Alcohol use  Do not drink alcohol if: ? Your health care provider tells you not to drink. ? You are pregnant, may be pregnant, or are planning to become pregnant.  If you drink alcohol: ? Limit how much you use to 0-1 drink a day. ? Limit intake if you are breastfeeding.  Be aware of how much alcohol is in your drink. In the [place] one drink equals one 12 oz bottle of beer (355 mL), one 5 oz glass of wine (148 mL), or one 1 oz glass of hard liquor (44 mL). General instructions  Schedule regular health, dental, and eye exams.  Stay current with your vaccines.  Tell your health care provider if: ? You often feel depressed. ? You have ever been abused or do not feel safe at home. Summary  Adopting a healthy lifestyle and getting preventive care are important in promoting health and wellness.    Follow your health care provider's instructions about healthy diet, exercising, and getting tested or screened for diseases.  Follow your health care provider's instructions on monitoring your cholesterol and blood pressure. This information is not intended to replace advice given to you by your health care provider. Make sure you discuss any questions you have with your health care provider. Document Revised: 06/05/2018 Document Reviewed: 06/05/2018 Elsevier Patient Education  [place].  Rib Fracture  A rib fracture is a break or crack in one of the bones of the ribs. The ribs are like a cage that goes around your upper chest. A broken or cracked rib is often painful, but most do not cause other problems. Most rib fractures usually heal on their own in 1-3 months. Follow these instructions at home: Managing pain, stiffness, and swelling  If directed, apply ice to the injured area. ? Put ice in a plastic bag. ? Place a towel between your skin and the  bag. ? Leave the ice on for 20 minutes, 2-3 times a day.  Take over-the-counter and prescription medicines only as told by your doctor. Activity  Avoid activities that cause pain to the injured area. Protect your injured area.  Slowly increase activity as told by your doctor. General instructions  Do deep breathing as told by your doctor. You may be told to: ? Take deep breaths many times a day. ? Cough many times a day while hugging a pillow. ? Use a device (incentive spirometer) to do deep breathing many times a day.  Drink enough fluid to keep your pee (urine) clear or pale yellow.  Do not wear a rib belt or binder. These do not allow you to breathe deeply.  Keep all follow-up visits as told by your doctor. This is important. Contact a doctor if:  You have a fever. Get help right away if:  You have trouble breathing.  You are short of breath.  You cannot stop coughing.  You cough up thick or bloody spit (sputum).  You feel sick to your stomach (nauseous), throw up (vomit), or have belly (abdominal) pain.  Your pain gets worse and medicine does not help. Summary  A rib fracture is a break or crack in one of the bones of the ribs.  Apply ice to the injured area and take medicines for pain as told by your doctor.  Take deep breaths and cough many times a day. Hug a pillow every time you cough. This information is not intended to replace advice given to you by your health care provider. Make sure you discuss any questions you have with your health care provider. Document Revised: 05/25/2017 Document Reviewed: 09/12/2016 Elsevier Patient Education  2020 [employer].

## 2020-05-13 NOTE — Telephone Encounter (Signed)
Pt is not understanding why she was giving cholesterol medication, called to discuss labs.

## 2020-05-26 ENCOUNTER — Other Ambulatory Visit: Payer: Self-pay | Admitting: Emergency Medicine

## 2020-05-26 ENCOUNTER — Ambulatory Visit: Payer: 59 | Admitting: Emergency Medicine

## 2020-05-26 ENCOUNTER — Ambulatory Visit (INDEPENDENT_AMBULATORY_CARE_PROVIDER_SITE_OTHER): Payer: 59 | Admitting: Emergency Medicine

## 2020-05-26 ENCOUNTER — Ambulatory Visit (INDEPENDENT_AMBULATORY_CARE_PROVIDER_SITE_OTHER): Payer: 59

## 2020-05-26 ENCOUNTER — Other Ambulatory Visit: Payer: Self-pay

## 2020-05-26 ENCOUNTER — Encounter: Payer: Self-pay | Admitting: Emergency Medicine

## 2020-05-26 VITALS — BP 134/80 | HR 93 | Temp 98.4°F | Ht <= 58 in | Wt 133.4 lb

## 2020-05-26 DIAGNOSIS — F17209 Nicotine dependence, unspecified, with unspecified nicotine-induced disorders: Secondary | ICD-10-CM | POA: Diagnosis not present

## 2020-05-26 DIAGNOSIS — S2242XD Multiple fractures of ribs, left side, subsequent encounter for fracture with routine healing: Secondary | ICD-10-CM

## 2020-05-26 DIAGNOSIS — Z716 Tobacco abuse counseling: Secondary | ICD-10-CM

## 2020-05-26 MED ORDER — NICOTINE 14 MG/24HR TD PT24
14.0000 mg | MEDICATED_PATCH | TRANSDERMAL | 5 refills | Status: DC
Start: 2020-05-26 — End: 2020-05-26

## 2020-05-26 MED FILL — NICOTINE 14 MG/24HR PATCH: 14 | 28 days supply | Qty: 28 | Fill #0
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
## 2020-05-26 NOTE — Patient Instructions (Addendum)
If you have lab work done today you will be contacted with your lab results within the next 2 weeks.  If you have not heard from [place] then please contact us. The fastest way to get your results is to register for My Chart.   IF you received an x-ray today, you will receive an invoice from [facility]. Please contact [facility] at [phone] with questions or concerns regarding your invoice.   IF you received labwork today, you will receive an invoice from [facility]. Please contact [facility] at [phone] with questions or concerns regarding your invoice.   Our billing staff will not be able to assist you with questions regarding bills from these companies.  You will be contacted with the lab results as soon as they are available. The fastest way to get your results is to activate your My Chart account. Instructions are located on the last page of this paperwork. If you have not heard from [place] regarding the results in 2 weeks, please contact this office.     Steps to Quit Smoking Smoking tobacco is the leading cause of preventable death. It can affect almost every organ in the body. Smoking puts you and people around you at risk for many serious, long-lasting (chronic) diseases. Quitting smoking can be hard, but it is one of the best things that you can do for your health. It is never too late to quit. How do I get ready to quit? When you decide to quit smoking, make a plan to help you succeed. Before you quit:  Pick a date to quit. Set a date within the next 2 weeks to give you time to prepare.  Write down the reasons why you are quitting. Keep this list in places where you will see it often.  Tell your family, friends, and co-workers that you are quitting. Their support is important.  Talk with your doctor about the choices that may help you quit.  Find out if your health insurance will pay for these treatments.  Know the people, places, things, and activities  that make you want to smoke (triggers). Avoid them. What first steps can I take to quit smoking?  Throw away all cigarettes at home, at work, and in your car.  Throw away the things that you use when you smoke, such as ashtrays and lighters.  Clean your car. Make sure to empty the ashtray.  Clean your home, including curtains and carpets. What can I do to help me quit smoking? Talk with your doctor about taking medicines and seeing a counselor at the same time. You are more likely to succeed when you do both.  If you are pregnant or breastfeeding, talk with your doctor about counseling or other ways to quit smoking. Do not take medicine to help you quit smoking unless your doctor tells you to do so. To quit smoking: Quit right away  Quit smoking totally, instead of slowly cutting back on how much you smoke over a period of time.  Go to counseling. You are more likely to quit if you go to counseling sessions regularly. Take medicine You may take medicines to help you quit. Some medicines need a prescription, and some you can buy over-the-counter. Some medicines may contain a drug called nicotine to replace the nicotine in cigarettes. Medicines may:  Help you to stop having the desire to smoke (cravings).  Help to stop the problems that come when you stop smoking (withdrawal symptoms). Your doctor may  ask you to use:  Nicotine patches, gum, or lozenges.  Nicotine inhalers or sprays.  Non-nicotine medicine that is taken by mouth. Find resources Find resources and other ways to help you quit smoking and remain smoke-free after you quit. These resources are most helpful when you use them often. They include:  Online chats with a Social worker.  Phone quitlines.  Printed Furniture conservator/restorer.  Support groups or group counseling.  Text messaging programs.  Mobile phone apps. Use apps on your mobile phone or tablet that can help you stick to your quit plan. There are many free apps  for mobile phones and tablets as well as websites. Examples include Quit Guide from the [employer] and smokefree.gov  What things can I do to make it easier to quit?   Talk to your family and friends. Ask them to support and encourage you.  Call a phone quitline (1-800-QUIT-NOW), reach out to support groups, or work with a Social worker.  Ask people who smoke to not smoke around you.  Avoid places that make you want to smoke, such as: ? Bars. ? Parties. ? Smoke-break areas at work.  Spend time with people who do not smoke.  Lower the stress in your life. Stress can make you want to smoke. Try these things to help your stress: ? Getting regular exercise. ? Doing deep-breathing exercises. ? Doing yoga. ? Meditating. ? Doing a body scan. To do this, close your eyes, focus on one area of your body at a time from head to toe. Notice which parts of your body are tense. Try to relax the muscles in those areas. How will I feel when I quit smoking? Day 1 to 3 weeks Within the first 24 hours, you may start to have some problems that come from quitting tobacco. These problems are very bad 2-3 days after you quit, but they do not often last for more than 2-3 weeks. You may get these symptoms:  Mood swings.  Feeling restless, nervous, angry, or annoyed.  Trouble concentrating.  Dizziness.  Strong desire for high-sugar foods and nicotine.  Weight gain.  Trouble pooping (constipation).  Feeling like you may vomit (nausea).  Coughing or a sore throat.  Changes in how the medicines that you take for other issues work in your body.  Depression.  Trouble sleeping (insomnia). Week 3 and afterward After the first 2-3 weeks of quitting, you may start to notice more positive results, such as:  Better sense of smell and taste.  Less coughing and sore throat.  Slower heart rate.  Lower blood pressure.  Clearer skin.  Better breathing.  Fewer sick days. Quitting smoking can be hard. Do  not give up if you fail the first time. Some people need to try a few times before they succeed. Do your best to stick to your quit plan, and talk with your doctor if you have any questions or concerns. Summary  Smoking tobacco is the leading cause of preventable death. Quitting smoking can be hard, but it is one of the best things that you can do for your health.  When you decide to quit smoking, make a plan to help you succeed.  Quit smoking right away, not slowly over a period of time.  When you start quitting, seek help from your doctor, family, or friends. This information is not intended to replace advice given to you by your health care provider. Make sure you discuss any questions you have with your health care provider. Document Revised:  03/07/2019 Document Reviewed: 08/31/2018 Elsevier Patient Education  [employer].  Rib Fracture  A rib fracture is a break or crack in one of the bones of the ribs. The ribs are like a cage that goes around your upper chest. A broken or cracked rib is often painful, but most do not cause other problems. Most rib fractures usually heal on their own in 1-3 months. Follow these instructions at home: Managing pain, stiffness, and swelling  If directed, apply ice to the injured area. ? Put ice in a plastic bag. ? Place a towel between your skin and the bag. ? Leave the ice on for 20 minutes, 2-3 times a day.  Take over-the-counter and prescription medicines only as told by your doctor. Activity  Avoid activities that cause pain to the injured area. Protect your injured area.  Slowly increase activity as told by your doctor. General instructions  Do deep breathing as told by your doctor. You may be told to: ? Take deep breaths many times a day. ? Cough many times a day while hugging a pillow. ? Use a device (incentive spirometer) to do deep breathing many times a day.  Drink enough fluid to keep your pee (urine) clear or pale  yellow.  Do not wear a rib belt or binder. These do not allow you to breathe deeply.  Keep all follow-up visits as told by your doctor. This is important. Contact a doctor if:  You have a fever. Get help right away if:  You have trouble breathing.  You are short of breath.  You cannot stop coughing.  You cough up thick or bloody spit (sputum).  You feel sick to your stomach (nauseous), throw up (vomit), or have belly (abdominal) pain.  Your pain gets worse and medicine does not help. Summary  A rib fracture is a break or crack in one of the bones of the ribs.  Apply ice to the injured area and take medicines for pain as told by your doctor.  Take deep breaths and cough many times a day. Hug a pillow every time you cough. This information is not intended to replace advice given to you by your health care provider. Make sure you discuss any questions you have with your health care provider. Document Revised: 05/25/2017 Document Reviewed: 09/12/2016 Elsevier Patient Education  2020 [employer].

## 2020-06-09 ENCOUNTER — Other Ambulatory Visit: Payer: Self-pay | Admitting: Emergency Medicine

## 2020-06-09 ENCOUNTER — Ambulatory Visit (INDEPENDENT_AMBULATORY_CARE_PROVIDER_SITE_OTHER): Payer: 59 | Admitting: Emergency Medicine

## 2020-06-09 ENCOUNTER — Other Ambulatory Visit: Payer: Self-pay

## 2020-06-09 ENCOUNTER — Encounter: Payer: Self-pay | Admitting: Emergency Medicine

## 2020-06-09 VITALS — BP 146/88 | HR 82 | Temp 97.7°F | Resp 16 | Ht 59.0 in | Wt 136.0 lb

## 2020-06-09 DIAGNOSIS — F17200 Nicotine dependence, unspecified, uncomplicated: Secondary | ICD-10-CM

## 2020-06-09 DIAGNOSIS — I10 Essential (primary) hypertension: Secondary | ICD-10-CM | POA: Diagnosis not present

## 2020-06-09 DIAGNOSIS — S2232XG Fracture of one rib, left side, subsequent encounter for fracture with delayed healing: Secondary | ICD-10-CM | POA: Diagnosis not present

## 2020-06-09 DIAGNOSIS — E785 Hyperlipidemia, unspecified: Secondary | ICD-10-CM | POA: Diagnosis not present

## 2020-06-09 DIAGNOSIS — Z23 Encounter for immunization: Secondary | ICD-10-CM

## 2020-06-09 MED ORDER — AMLODIPINE BESYLATE 5 MG PO TABS: 5.0000 mg | ORAL_TABLET | Freq: Every day | ORAL | 3 refills | Status: DC

## 2020-06-09 MED ORDER — BUPROPION HCL ER (SR) 150 MG PO TB12: 150.0000 mg | ORAL_TABLET | Freq: Every day | ORAL | 3 refills | Status: DC

## 2020-06-09 MED ORDER — LISINOPRIL 20 MG PO TABS: 20.0000 mg | ORAL_TABLET | Freq: Every day | ORAL | 3 refills | Status: DC

## 2020-06-09 MED FILL — CREON DR 36,000 UNITS CAP: 36000-11400 | 30 days supply | Qty: 330 | Fill #2

## 2020-06-09 MED FILL — AMLODIPINE BESYLATE 5 MG TA: 5 | 90 days supply | Qty: 90 | Fill #0

## 2020-06-09 MED FILL — LISINOPRIL 20 MG TABLET: 20 | 90 days supply | Qty: 90 | Fill #1

## 2020-06-09 MED FILL — OMEPRAZOLE 40 MG CPDR: 40 | 90 days supply | Qty: 90 | Fill #0

## 2020-06-09 MED FILL — BUPROPION HCL SR 150 MG TAB: 150 | 90 days supply | Qty: 90 | Fill #0

## 2020-06-09 NOTE — Telephone Encounter (Signed)
Requested medication (s) are due for refill today:  Yes  Requested medication (s) are on the active medication list:   Yes  Future visit scheduled:   Yes today at 8:40 (12/15) with Dr. Mitchel Honour   address during [place]?   Last ordered: Pt has appt today at 8:40.   Requested Prescriptions  Pending Prescriptions Disp Refills   omeprazole (PRILOSEC) 40 MG capsule [Pharmacy Med Name: OMEPRAZOLE 40 MG CPDR 40 Capsule] 90 capsule 3    Sig: TAKE 1 CAPSULE BY MOUTH DAILY.      There is no refill protocol information for this order

## 2020-06-09 NOTE — Progress Notes (Signed)
[name] 59 y.o.   No chief complaint on file.   HISTORY OF PRESENT ILLNESS: This is a 59 y.o. female here for 2-week follow-up of left-sided rib fracture. Feels about 80% better. Current smoker. Nicotine patch too strong. Open to trying daily pill instead. Also history of hypertension on amlodipine and lisinopril. Needs medication refill. No other complaints or medical concerns.  HPI   Prior to Admission medications   Medication Sig Start Date End Date Taking? Authorizing Provider  acetaminophen (TYLENOL) 500 MG tablet Take 1 tablet (500 mg total) by mouth every 6 (six) hours as needed for moderate pain. 10/16/19  Yes Corena Herter, PA-C  AMBULATORY NON FORMULARY MEDICATION Medication Name: nitroglycerin 0.125 mg ointment mixed with 2% lidocaine three times a day for 6-8 weeks 05/09/18  Yes Bartholomew Crews, MD  diclofenac Sodium (VOLTAREN) 1 % GEL Apply 2 g topically 4 (four) times daily. 02/18/20  Yes Masoudi, Elhamalsadat, MD  ferrous sulfate (IRON SUPPLEMENT) 325 (65 FE) MG tablet Take 1 tablet (325 mg total) by mouth daily with breakfast. 03/01/16  Yes Bartholomew Crews, MD  HYDROcodone-acetaminophen (NORCO) 7.5-325 MG tablet Take 1 tablet by mouth every 6 (six) hours as needed for moderate pain. 05/08/20  Yes Bast, Traci A, NP  hydrocortisone (ANUSOL-HC) 2.5 % rectal cream Place 1 application rectally 2 (two) times daily. Patient taking differently: Place 1 application rectally 2 (two) times daily as needed for hemorrhoids. 05/09/18  Yes Bartholomew Crews, MD  hydrOXYzine (ATARAX/VISTARIL) 10 MG tablet Take 1 tablet (10 mg total) by mouth 3 (three) times daily as needed for anxiety. 12/30/19  Yes Mosetta Anis, MD  ibuprofen (ADVIL) 600 MG tablet Take 1 tablet (600 mg total) by mouth every 6 (six) hours as needed. 05/02/20  Yes Raylene Everts, MD  lipase/protease/amylase (CREON) [phone] UNITS CPEP capsule Take 3 capsules with each meal and 2 with a snack 02/13/20   Yes Irene Shipper, MD  omeprazole (PRILOSEC) 40 MG capsule TAKE 1 CAPSULE BY MOUTH DAILY. Patient taking differently: Take 40 mg by mouth daily. 05/26/19  Yes Bartholomew Crews, MD  pramoxine (PROCTOFOAM) 1 % foam Place 1 application rectally 3 (three) times daily as needed for anal itching. 11/25/18  Yes Bloomfield, Carley D, DO  rosuvastatin (CRESTOR) 20 MG tablet Take 1 tablet (20 mg total) by mouth daily. 05/12/20  Yes Rorey Bisson, Ines Bloomer, MD  amLODipine (NORVASC) 5 MG tablet Take 1 tablet (5 mg total) by mouth daily. 06/09/20   Horald Pollen, MD  buPROPion [facility] SR) 150 MG 12 hr tablet Take 1 tablet (150 mg total) by mouth daily. 06/09/20 09/07/20  Horald Pollen, MD  lisinopril (ZESTRIL) 20 MG tablet Take 1 tablet (20 mg total) by mouth daily. 06/09/20   Horald Pollen, MD  nicotine (NICODERM CQ) 14 mg/24hr patch Place 1 patch (14 mg total) onto the skin daily. Patient not taking: Reported on 06/09/2020 05/26/20 06/25/20  Horald Pollen, MD    Allergies  Allergen Reactions  . Aspirin Hives  . Hydromorphone Hcl Hives and Nausea And Vomiting  . Latex Rash    Patient Active Problem List   Diagnosis Date Noted  . Chronic diarrhea 09/14/2017  . H/O TIA (transient ischemic attack) and stroke 06/12/2014  . Cervical radiculitis 02/12/2014  . Essential hypertension, benign 01/24/2010  . Microcytic anemia 01/11/2010  . Hyperlipemia 05/28/2007  . TOBACCO ABUSE 05/28/2007  . GERD 05/28/2007    Past Medical History:  Diagnosis Date  . Anemia   . Arthritis    "neck, hips" (04/25/2018)  . Claustrophobia   . Daily headache   . GERD (gastroesophageal reflux disease)   . Hiatal hernia   . Hyperlipidemia    "hx" (04/25/2018)  . Hypertension   . Plantar fasciitis    hx - left foot  . Stroke [facility]) 05/2014   "mini stroke" (04/25/2018)    Past Surgical History:  Procedure Laterality Date  . ABDOMINAL HYSTERECTOMY  12/09   Secondary to fibroids   . ARTERY BIOPSY Right 04/10/2018   Procedure: BIOPSY TEMPORAL ARTERY;  Surgeon: Rosetta Posner, MD;  Location: [place];  Service: Vascular;  Laterality: Right;  . [place]; 1988  . COLONOSCOPY    . FOOT SURGERY Left 03/2017   Bone Spurs Removed  . RADIOLOGY WITH ANESTHESIA N/A 04/26/2018   Procedure: MRI WITH ANESTHESIA;  Surgeon: Radiologist, Medication, MD;  Location: [place];  Service: Radiology;  Laterality: N/A;  . RADIOLOGY WITH ANESTHESIA N/A 08/24/2019   Procedure: MRI WITH ANESTHESIA CERVICAL SPINE;  Surgeon: Radiologist, Medication, MD;  Location: [place];  Service: Radiology;  Laterality: N/A;  . RADIOLOGY WITH ANESTHESIA N/A 10/02/2019   Procedure: MRI WITH ANESTHESIA CERVICAL WITHOUT CONTRAST;  Surgeon: Radiologist, Medication, MD;  Location: [place];  Service: Radiology;  Laterality: N/A;  . TUBAL LIGATION  1988    Social History   Socioeconomic History  . Marital status: Divorced    Spouse name: Not on file  . Number of children: Not on file  . Years of education: Not on file  . Highest education level: Not on file  Occupational History  . Occupation: unemployed    Comment: used to work in a [employer] Use  . Smoking status: Current Some Day Smoker    Packs/day: 0.25    Years: 38.00    Pack years: 9.50    Types: Cigarettes  . Smokeless tobacco: Never Used  . Tobacco comment: 0-4 cigs/day   Vaping Use  . Vaping Use: Never used  Substance and Sexual Activity  . Alcohol use: Yes    Alcohol/week: 4.0 standard drinks    Types: 4 Cans of beer per week  . Drug use: Not Currently  . Sexual activity: Not Currently    Birth control/protection: Surgical    Comment: Hysterectomy  Other Topics Concern  . Not on file  Social History Narrative   10 th grade education.   Social Determinants of Health   Financial Resource Strain: Not on file  Food Insecurity: Not on file  Transportation Needs: No Transportation Needs  . Lack of Transportation  (Medical): No  . Lack of Transportation (Non-Medical): No  Physical Activity: Not on file  Stress: Not on file  Social Connections: Not on file  Intimate Partner Violence: Not on file    Family History  Problem Relation Age of Onset  . Cancer Mother 75       pancreatic cancer  . Pancreatic cancer Mother   . Heart disease Mother   . Cancer Father 39       throat cancer  . Esophageal cancer Father   . Cancer Maternal Aunt 60       breast cancer  . Cancer Other        Died  from Colon cancer in 46's  . Colon cancer Maternal Uncle   . Colon cancer Paternal Uncle   . Heart disease Sister   . Rectal cancer Neg  Hx   . Stomach cancer Neg Hx      Review of Systems  Constitutional: Negative.  Negative for chills and fever.  HENT: Negative.  Negative for congestion and sore throat.   Respiratory: Negative.  Negative for cough and shortness of breath.   Cardiovascular: Negative.  Negative for chest pain and palpitations.  Gastrointestinal: Negative.  Negative for abdominal pain, nausea and vomiting.  Genitourinary: Negative.   Skin: Negative.  Negative for rash.  Neurological: Negative.  Negative for dizziness and headaches.  All other systems reviewed and are negative.   Today's Vitals   06/09/20 0835  BP: (!) 166/100  Pulse: 82  Resp: 16  Temp: 97.7 F (36.5 C)  TempSrc: Temporal  SpO2: 99%  Weight: 136 lb (61.7 kg)  Height: 4\' 11"  (1.499 m)   Body mass index is 27.47 kg/m.  Physical Exam Vitals reviewed.  Constitutional:      Appearance: Normal appearance.  HENT:     Head: Normocephalic.  Eyes:     Extraocular Movements: Extraocular movements intact.     Pupils: Pupils are equal, round, and reactive to light.  Cardiovascular:     Rate and Rhythm: Normal rate and regular rhythm.     Pulses: Normal pulses.     Heart sounds: Normal heart sounds.  Pulmonary:     Effort: Pulmonary effort is normal.     Breath sounds: Normal breath sounds.  Musculoskeletal:         General: Normal range of motion.     Cervical back: Normal range of motion and neck supple.  Skin:    General: Skin is warm and dry.     Capillary Refill: Capillary refill takes less than 2 seconds.  Neurological:     General: No focal deficit present.     Mental Status: She is alert and oriented to person, place, and time.  Psychiatric:        Mood and Affect: Mood normal.        Behavior: Behavior normal.      ASSESSMENT & PLAN: Diagnoses and all orders for this visit:  Open fracture of one rib of left side with delayed healing, subsequent encounter  Tobacco dependency -     buPROPion (WELLBUTRIN SR) 150 MG 12 hr tablet; Take 1 tablet (150 mg total) by mouth daily.  Essential hypertension -     lisinopril (ZESTRIL) 20 MG tablet; Take 1 tablet (20 mg total) by mouth daily. -     amLODipine (NORVASC) 5 MG tablet; Take 1 tablet (5 mg total) by mouth daily.  Dyslipidemia  Current smoker    Patient Instructions       If you have lab work done today you will be contacted with your lab results within the next 2 weeks.  If you have not heard from [place] then please contact us. The fastest way to get your results is to register for My Chart.   IF you received an x-ray today, you will receive an invoice from [facility]. Please contact [facility] at [phone] with questions or concerns regarding your invoice.   IF you received labwork today, you will receive an invoice from [facility]. Please contact [facility] at [phone] with questions or concerns regarding your invoice.   Our billing staff will not be able to assist you with questions regarding bills from these companies.  You will be contacted with the lab results as soon as they are available. The fastest way to get your results is  to activate your My Chart account. Instructions are located on the last page of this paperwork. If you have not heard from [place] regarding the results in 2 weeks, please  contact this office.     Steps to Quit Smoking Smoking tobacco is the leading cause of preventable death. It can affect almost every organ in the body. Smoking puts you and people around you at risk for many serious, long-lasting (chronic) diseases. Quitting smoking can be hard, but it is one of the best things that you can do for your health. It is never too late to quit. How do I get ready to quit? When you decide to quit smoking, make a plan to help you succeed. Before you quit:  Pick a date to quit. Set a date within the next 2 weeks to give you time to prepare.  Write down the reasons why you are quitting. Keep this list in places where you will see it often.  Tell your family, friends, and co-workers that you are quitting. Their support is important.  Talk with your doctor about the choices that may help you quit.  Find out if your health insurance will pay for these treatments.  Know the people, places, things, and activities that make you want to smoke (triggers). Avoid them. What first steps can I take to quit smoking?  Throw away all cigarettes at home, at work, and in your car.  Throw away the things that you use when you smoke, such as ashtrays and lighters.  Clean your car. Make sure to empty the ashtray.  Clean your home, including curtains and carpets. What can I do to help me quit smoking? Talk with your doctor about taking medicines and seeing a counselor at the same time. You are more likely to succeed when you do both.  If you are pregnant or breastfeeding, talk with your doctor about counseling or other ways to quit smoking. Do not take medicine to help you quit smoking unless your doctor tells you to do so. To quit smoking: Quit right away  Quit smoking totally, instead of slowly cutting back on how much you smoke over a period of time.  Go to counseling. You are more likely to quit if you go to counseling sessions regularly. Take medicine You may take  medicines to help you quit. Some medicines need a prescription, and some you can buy over-the-counter. Some medicines may contain a drug called nicotine to replace the nicotine in cigarettes. Medicines may:  Help you to stop having the desire to smoke (cravings).  Help to stop the problems that come when you stop smoking (withdrawal symptoms). Your doctor may ask you to use:  Nicotine patches, gum, or lozenges.  Nicotine inhalers or sprays.  Non-nicotine medicine that is taken by mouth. Find resources Find resources and other ways to help you quit smoking and remain smoke-free after you quit. These resources are most helpful when you use them often. They include:  Online chats with a Social worker.  Phone quitlines.  Printed Furniture conservator/restorer.  Support groups or group counseling.  Text messaging programs.  Mobile phone apps. Use apps on your mobile phone or tablet that can help you stick to your quit plan. There are many free apps for mobile phones and tablets as well as websites. Examples include Quit Guide from the [employer] and smokefree.gov  What things can I do to make it easier to quit?   Talk to your family and friends. Ask them to support  and encourage you.  Call a phone quitline (1-800-QUIT-NOW), reach out to support groups, or work with a Social worker.  Ask people who smoke to not smoke around you.  Avoid places that make you want to smoke, such as: ? Bars. ? Parties. ? Smoke-break areas at work.  Spend time with people who do not smoke.  Lower the stress in your life. Stress can make you want to smoke. Try these things to help your stress: ? Getting regular exercise. ? Doing deep-breathing exercises. ? Doing yoga. ? Meditating. ? Doing a body scan. To do this, close your eyes, focus on one area of your body at a time from head to toe. Notice which parts of your body are tense. Try to relax the muscles in those areas. How will I feel when I quit smoking? Day 1 to 3  weeks Within the first 24 hours, you may start to have some problems that come from quitting tobacco. These problems are very bad 2-3 days after you quit, but they do not often last for more than 2-3 weeks. You may get these symptoms:  Mood swings.  Feeling restless, nervous, angry, or annoyed.  Trouble concentrating.  Dizziness.  Strong desire for high-sugar foods and nicotine.  Weight gain.  Trouble pooping (constipation).  Feeling like you may vomit (nausea).  Coughing or a sore throat.  Changes in how the medicines that you take for other issues work in your body.  Depression.  Trouble sleeping (insomnia). Week 3 and afterward After the first 2-3 weeks of quitting, you may start to notice more positive results, such as:  Better sense of smell and taste.  Less coughing and sore throat.  Slower heart rate.  Lower blood pressure.  Clearer skin.  Better breathing.  Fewer sick days. Quitting smoking can be hard. Do not give up if you fail the first time. Some people need to try a few times before they succeed. Do your best to stick to your quit plan, and talk with your doctor if you have any questions or concerns. Summary  Smoking tobacco is the leading cause of preventable death. Quitting smoking can be hard, but it is one of the best things that you can do for your health.  When you decide to quit smoking, make a plan to help you succeed.  Quit smoking right away, not slowly over a period of time.  When you start quitting, seek help from your doctor, family, or friends. This information is not intended to replace advice given to you by your health care provider. Make sure you discuss any questions you have with your health care provider. Document Revised: 03/07/2019 Document Reviewed: 08/31/2018 Elsevier Patient Education  2020 Elsevier Inc.      Agustina Caroli, MD [facility]

## 2020-06-09 NOTE — Addendum Note (Signed)
Addended by: Alfredia Ferguson A on: 06/09/2020 09:35 AM   Modules accepted: Orders

## 2020-06-09 NOTE — Patient Instructions (Addendum)
If you have lab work done today you will be contacted with your lab results within the next 2 weeks.  If you have not heard from [place] then please contact us. The fastest way to get your results is to register for My Chart.   IF you received an x-ray today, you will receive an invoice from [facility]. Please contact [facility] at [phone] with questions or concerns regarding your invoice.   IF you received labwork today, you will receive an invoice from [place]. Please contact [facility] at [phone] with questions or concerns regarding your invoice.   Our billing staff will not be able to assist you with questions regarding bills from these companies.  You will be contacted with the lab results as soon as they are available. The fastest way to get your results is to activate your My Chart account. Instructions are located on the last page of this paperwork. If you have not heard from [place] regarding the results in 2 weeks, please contact this office.     Steps to Quit Smoking Smoking tobacco is the leading cause of preventable death. It can affect almost every organ in the body. Smoking puts you and people around you at risk for many serious, long-lasting (chronic) diseases. Quitting smoking can be hard, but it is one of the best things that you can do for your health. It is never too late to quit. How do I get ready to quit? When you decide to quit smoking, make a plan to help you succeed. Before you quit:  Pick a date to quit. Set a date within the next 2 weeks to give you time to prepare.  Write down the reasons why you are quitting. Keep this list in places where you will see it often.  Tell your family, friends, and co-workers that you are quitting. Their support is important.  Talk with your doctor about the choices that may help you quit.  Find out if your health insurance will pay for these treatments.  Know the people, places, things, and activities  that make you want to smoke (triggers). Avoid them. What first steps can I take to quit smoking?  Throw away all cigarettes at home, at work, and in your car.  Throw away the things that you use when you smoke, such as ashtrays and lighters.  Clean your car. Make sure to empty the ashtray.  Clean your home, including curtains and carpets. What can I do to help me quit smoking? Talk with your doctor about taking medicines and seeing a counselor at the same time. You are more likely to succeed when you do both.  If you are pregnant or breastfeeding, talk with your doctor about counseling or other ways to quit smoking. Do not take medicine to help you quit smoking unless your doctor tells you to do so. To quit smoking: Quit right away  Quit smoking totally, instead of slowly cutting back on how much you smoke over a period of time.  Go to counseling. You are more likely to quit if you go to counseling sessions regularly. Take medicine You may take medicines to help you quit. Some medicines need a prescription, and some you can buy over-the-counter. Some medicines may contain a drug called nicotine to replace the nicotine in cigarettes. Medicines may:  Help you to stop having the desire to smoke (cravings).  Help to stop the problems that come when you stop smoking (withdrawal symptoms). Your doctor may  ask you to use:  Nicotine patches, gum, or lozenges.  Nicotine inhalers or sprays.  Non-nicotine medicine that is taken by mouth. Find resources Find resources and other ways to help you quit smoking and remain smoke-free after you quit. These resources are most helpful when you use them often. They include:  Online chats with a Social worker.  Phone quitlines.  Printed Furniture conservator/restorer.  Support groups or group counseling.  Text messaging programs.  Mobile phone apps. Use apps on your mobile phone or tablet that can help you stick to your quit plan. There are many free apps  for mobile phones and tablets as well as websites. Examples include Quit Guide from the [employer] and smokefree.gov  What things can I do to make it easier to quit?   Talk to your family and friends. Ask them to support and encourage you.  Call a phone quitline (1-800-QUIT-NOW), reach out to support groups, or work with a Social worker.  Ask people who smoke to not smoke around you.  Avoid places that make you want to smoke, such as: ? Bars. ? Parties. ? Smoke-break areas at work.  Spend time with people who do not smoke.  Lower the stress in your life. Stress can make you want to smoke. Try these things to help your stress: ? Getting regular exercise. ? Doing deep-breathing exercises. ? Doing yoga. ? Meditating. ? Doing a body scan. To do this, close your eyes, focus on one area of your body at a time from head to toe. Notice which parts of your body are tense. Try to relax the muscles in those areas. How will I feel when I quit smoking? Day 1 to 3 weeks Within the first 24 hours, you may start to have some problems that come from quitting tobacco. These problems are very bad 2-3 days after you quit, but they do not often last for more than 2-3 weeks. You may get these symptoms:  Mood swings.  Feeling restless, nervous, angry, or annoyed.  Trouble concentrating.  Dizziness.  Strong desire for high-sugar foods and nicotine.  Weight gain.  Trouble pooping (constipation).  Feeling like you may vomit (nausea).  Coughing or a sore throat.  Changes in how the medicines that you take for other issues work in your body.  Depression.  Trouble sleeping (insomnia). Week 3 and afterward After the first 2-3 weeks of quitting, you may start to notice more positive results, such as:  Better sense of smell and taste.  Less coughing and sore throat.  Slower heart rate.  Lower blood pressure.  Clearer skin.  Better breathing.  Fewer sick days. Quitting smoking can be hard. Do  not give up if you fail the first time. Some people need to try a few times before they succeed. Do your best to stick to your quit plan, and talk with your doctor if you have any questions or concerns. Summary  Smoking tobacco is the leading cause of preventable death. Quitting smoking can be hard, but it is one of the best things that you can do for your health.  When you decide to quit smoking, make a plan to help you succeed.  Quit smoking right away, not slowly over a period of time.  When you start quitting, seek help from your doctor, family, or friends. This information is not intended to replace advice given to you by your health care provider. Make sure you discuss any questions you have with your health care provider. Document Revised:  03/07/2019 Document Reviewed: 08/31/2018 Elsevier Patient Education  [place].

## 2020-06-30 ENCOUNTER — Telehealth: Payer: Self-pay | Admitting: Internal Medicine

## 2020-06-30 ENCOUNTER — Ambulatory Visit: Payer: 59 | Admitting: Emergency Medicine

## 2020-06-30 NOTE — Telephone Encounter (Signed)
Pt states yesterday she started having diarrhea, reports about 6 diarrhea stools yesterday. Today she has already had 4-5 diarrhea stools. Pt reports she is currently taking Creon 3 caps with meals and 2 with snacks. Pt wanted to know if Dr. Marina Goodell wants her to increase to 4 caps with meals for a week to see if that helps or what he recommends. Please advise.

## 2020-06-30 NOTE — Telephone Encounter (Signed)
Yes, increase to 4 capsules with meals and snacks

## 2020-06-30 NOTE — Telephone Encounter (Signed)
Inbound call from patient stating she is currently having diarrhea.  Wants to know if she should increase Creon medication or should she schedule an office visit.  Please advise.

## 2020-06-30 NOTE — Telephone Encounter (Signed)
Spoke with pt and she is aware of Dr. Lamar Sprinkles recommendations. Pt knows to call back it this does not help.

## 2020-07-16 NOTE — Telephone Encounter (Signed)
Pt is requesting a call back from a nurse to discuss whether or not some samples would be available since her insurance is not willing to cover the Creon medication.

## 2020-07-21 NOTE — Telephone Encounter (Signed)
Busy signal

## 2020-07-23 NOTE — Telephone Encounter (Signed)
Pt called inquiring about samples. Sh stated that she is out of creon and she has ben experiencing diarrhea again. Pls call her

## 2020-07-23 NOTE — Telephone Encounter (Signed)
Lm on vm that I would put some samples up front for her to pick up but that we might need to look at trying to get assistance for her for going forward

## 2020-08-12 NOTE — Telephone Encounter (Signed)
Pt is requesting a refill on her creon

## 2020-08-13 ENCOUNTER — Other Ambulatory Visit: Payer: Self-pay | Admitting: Internal Medicine

## 2020-08-13 ENCOUNTER — Ambulatory Visit: Payer: 59 | Admitting: Orthopaedic Surgery

## 2020-08-13 MED ORDER — PANCRELIPASE (LIP-PROT-AMYL) 36000-114000 UNITS PO CPEP: ORAL_CAPSULE | ORAL | 6 refills | Status: DC

## 2020-08-13 NOTE — Addendum Note (Signed)
Addended by: Audrea Muscat on: 08/13/2020 10:44 AM   Modules accepted: Orders

## 2020-08-13 NOTE — Telephone Encounter (Signed)
Refilled Creon.

## 2020-09-08 ENCOUNTER — Telehealth: Payer: Self-pay | Admitting: Internal Medicine

## 2020-09-08 NOTE — Telephone Encounter (Signed)
Pt is requesting a call back from a nurse to discuss her CREON (pt states the medicine is costing her around $4000)

## 2020-09-14 ENCOUNTER — Other Ambulatory Visit: Payer: Self-pay | Admitting: Internal Medicine

## 2020-09-14 MED ORDER — PANCRELIPASE (LIP-PROT-AMYL) 36000-114000 UNITS PO CPEP: ORAL_CAPSULE | ORAL | 0 refills | Status: DC

## 2020-09-14 MED FILL — CREON DR 36,000 UNITS CAP: 36000-11400 | 5 days supply | Qty: 56 | Fill #0

## 2020-09-14 NOTE — Telephone Encounter (Signed)
Submitted PA for Creon through Cover My Meds

## 2020-09-14 NOTE — Addendum Note (Signed)
Addended by: Audrea Muscat on: 09/14/2020 04:28 PM   Modules accepted: Orders

## 2020-09-16 ENCOUNTER — Ambulatory Visit (HOSPITAL_COMMUNITY)
Admission: EM | Admit: 2020-09-16 | Discharge: 2020-09-16 | Disposition: A | Discharge status: Home / Self Care | Payer: 59 | Attending: Family Medicine | Admitting: Family Medicine

## 2020-09-16 ENCOUNTER — Encounter (HOSPITAL_COMMUNITY): Payer: Self-pay

## 2020-09-16 ENCOUNTER — Other Ambulatory Visit (HOSPITAL_COMMUNITY): Payer: Self-pay | Admitting: Family Medicine

## 2020-09-16 ENCOUNTER — Other Ambulatory Visit: Payer: Self-pay

## 2020-09-16 DIAGNOSIS — M5432 Sciatica, left side: Secondary | ICD-10-CM | POA: Diagnosis not present

## 2020-09-16 MED ORDER — DEXAMETHASONE SODIUM PHOSPHATE 10 MG/ML IJ SOLN
10.0000 mg | Freq: Once | INTRAMUSCULAR | Status: AC
  Administered 2020-09-16: 10 mg via INTRAMUSCULAR

## 2020-09-16 MED ORDER — TIZANIDINE HCL 2 MG PO TABS: 2.0000 mg | ORAL_TABLET | Freq: Four times a day (QID) | ORAL | 0 refills | Status: DC | PRN

## 2020-09-16 MED ORDER — DEXAMETHASONE SODIUM PHOSPHATE 10 MG/ML IJ SOLN
INTRAMUSCULAR | Status: AC
  Filled 2020-09-16: qty 1

## 2020-09-16 MED ORDER — TIZANIDINE HCL 4 MG PO TABS: 2.0000 mg | ORAL_TABLET | Freq: Four times a day (QID) | ORAL | 0 refills | Status: DC | PRN

## 2020-09-16 MED ORDER — KETOROLAC TROMETHAMINE 30 MG/ML IJ SOLN
30.0000 mg | Freq: Once | INTRAMUSCULAR | Status: AC
  Administered 2020-09-16: 30 mg via INTRAMUSCULAR

## 2020-09-16 MED ORDER — MELOXICAM 15 MG PO TABS
15.0000 mg | ORAL_TABLET | Freq: Every day | ORAL | 0 refills | Status: DC
Start: 2020-09-16 — End: 2020-09-16

## 2020-09-16 MED ORDER — KETOROLAC TROMETHAMINE 30 MG/ML IJ SOLN
INTRAMUSCULAR | Status: AC
  Filled 2020-09-16: qty 1

## 2020-09-16 MED FILL — MELOXICAM 15 MG TABLET: 15 | 30 days supply | Qty: 30 | Fill #0

## 2020-09-16 NOTE — Telephone Encounter (Signed)
Called Creon nurse ambassador program and requested that someone reach back out to the patience to help with assistance program.

## 2020-09-16 NOTE — Telephone Encounter (Signed)
Patient's Creon was approved but copay was still $700.  I spoke with patient and told her the next step was applying for assistance.  I printed out the form and put it up front for her to pick up and fill out.  Patient agreed.

## 2020-09-16 NOTE — ED Triage Notes (Signed)
Pt reports lower back pain, radiates to left leg x 1 day. Ibuprofen gives no relief.
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
## 2020-09-17 MED FILL — LISINOPRIL 20 MG TABLET: 20 | 90 days supply | Qty: 90 | Fill #2

## 2020-09-17 MED FILL — BUPROPION HCL SR 150 MG TAB: 150 | 90 days supply | Qty: 90 | Fill #1

## 2020-09-17 MED FILL — ROSUVASTATIN CALCIUM 20 MG: 20 | 90 days supply | Qty: 90 | Fill #1

## 2020-09-17 MED FILL — tiZANidine HCL 4 MG TABS: 4 | 10 days supply | Qty: 30 | Fill #0

## 2020-09-17 MED FILL — OMEPRAZOLE 40 MG CPDR: 40 | 90 days supply | Qty: 90 | Fill #1

## 2020-09-25 ENCOUNTER — Other Ambulatory Visit (HOSPITAL_COMMUNITY): Payer: Self-pay

## 2020-09-28 ENCOUNTER — Encounter: Payer: Self-pay | Admitting: Orthopaedic Surgery

## 2020-09-28 ENCOUNTER — Telehealth: Payer: Self-pay | Admitting: Internal Medicine

## 2020-09-28 ENCOUNTER — Ambulatory Visit (INDEPENDENT_AMBULATORY_CARE_PROVIDER_SITE_OTHER): Payer: 59 | Admitting: Orthopaedic Surgery

## 2020-09-28 DIAGNOSIS — M4802 Spinal stenosis, cervical region: Secondary | ICD-10-CM | POA: Diagnosis not present

## 2020-09-28 NOTE — Progress Notes (Signed)
Office Visit Note   Patient: [name]           Date of Birth: Jul 15, 1960           MRN: [id number] Visit Date: 09/28/2020              Requested by: Horald Pollen, [place],  [place] [address] PCP: Horald Pollen, MD   Assessment & Plan: Visit Diagnoses:  1. Foraminal stenosis of cervical region     Plan: Patient was supposed to be cleared by Dr. Henrene Pastor due to some problems with her pancreas.  She states she has had some medication changes and seems to be doing better.  She does have foraminal stenosis at C6-7 will be a candidate for single level cervical fusion which would be general anesthesia overnight stay in the hospital in 6 weeks in a soft collar.  She still having persistent symptoms she would need to be cleared and they would need to have a new MRI scan since her previous imaging studies now-year-old.  Patient on review of the new MRI and then we could consider possible surgical surgical intervention.  Follow-Up Instructions: No follow-ups on file.   Orders:  No orders of the defined types were placed in this encounter.  No orders of the defined types were placed in this encounter.     Procedures: No procedures performed   Clinical Data: No additional findings.   Subjective: Chief Complaint  Patient presents with  . Lower Back - Pain    HPI 60 year old female returns she was seen in urgent care with low back pain flare recently.  She does work for several years and is on her feet and states she has discomfort in her back with increased problems with turning twisting.  She is used ibuprofen, TENS unit [place], Mobic.  She got IM Decadron she was taken out of work by urgent care.  We had discussed with her cervical fusion at the C6-7 level where she has moderate right moderate to severe left foraminal stenosis endplate osteophytes and disc bulging.  These had progressed from previous MRI.  Last MRI was April 2021 and surgery  was delayed due to elective surgery cancellation from Covid.  Patient has increased symptoms with pulling and lifting and she says she has been thinking about changing to an easier type job position.  MRI 02/17/2020 was reviewed as described in 03/16/2020 note.  Review of Systems all other systems update unchanged.   Objective: Vital Signs: BP (!) 162/91   Pulse (!) 101   Ht 4\' 9"  (1.448 m)   Wt 130 lb (59 kg)   LMP  (LMP Unknown)   BMI 28.13 kg/m   Physical Exam Constitutional:      Appearance: She is well-developed.  HENT:     Head: Normocephalic.     Right Ear: External ear normal.     Left Ear: External ear normal.  Eyes:     Pupils: Pupils are equal, round, and reactive to light.  Neck:     Thyroid: No thyromegaly.     Trachea: No tracheal deviation.  Cardiovascular:     Rate and Rhythm: Normal rate.  Pulmonary:     Effort: Pulmonary effort is normal.  Abdominal:     Palpations: Abdomen is soft.  Skin:    General: Skin is warm and dry.  Neurological:     Mental Status: She is alert and oriented to person, place, and time.  Psychiatric:        Behavior: Behavior normal.     Ortho Exam patient can heel and toe walk no lower extremity clonus.  Some tenderness of the lumbosacral spine.  Negative logroll of the hips.  Knee and ankle jerk are intact.  She has brachial plexus tenderness on the right none on the left positive Spurling on the right.  No lower extremity clonus.  Carpal tunnel exam right and left side negative.  Specialty Comments:  No specialty comments available.  Imaging: No results found.   PMFS History: Patient Active Problem List   Diagnosis Date Noted  . 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/28/2020  . Chronic diarrhea 09/14/2017  . H/O TIA (transient ischemic attack) and stroke 06/12/2014  . Cervical radiculitis 02/12/2014  . Essential hypertension, benign 01/24/2010  . Microcytic anemia 01/11/2010  . Hyperlipemia 05/28/2007  . TOBACCO  ABUSE 05/28/2007  . GERD 05/28/2007   Past Medical History:  Diagnosis Date  . Anemia   . Arthritis    "neck, hips" (04/25/2018)  . Claustrophobia   . Daily headache   . GERD (gastroesophageal reflux disease)   . Hiatal hernia   . Hyperlipidemia    "hx" (04/25/2018)  . Hypertension   . Plantar fasciitis    hx - left foot  . Stroke [facility]) 05/2014   "mini stroke" (04/25/2018)    Family History  Problem Relation Age of Onset  . Cancer Mother 71       pancreatic cancer  . Pancreatic cancer Mother   . Heart disease Mother   . Cancer Father 47       throat cancer  . Esophageal cancer Father   . Cancer Maternal Aunt 60       breast cancer  . Cancer Other        Died  from Colon cancer in 57's  . Colon cancer Maternal Uncle   . Colon cancer Paternal Uncle   . Heart disease Sister   . Rectal cancer Neg Hx   . Stomach cancer Neg Hx     Past Surgical History:  Procedure Laterality Date  . ABDOMINAL HYSTERECTOMY  12/09   Secondary to fibroids  . ARTERY BIOPSY Right 04/10/2018   Procedure: BIOPSY TEMPORAL ARTERY;  Surgeon: Rosetta Posner, MD;  Location: [place];  Service: Vascular;  Laterality: Right;  . [place]; 1988  . COLONOSCOPY    . FOOT SURGERY Left 03/2017   Bone Spurs Removed  . RADIOLOGY WITH ANESTHESIA N/A 04/26/2018   Procedure: MRI WITH ANESTHESIA;  Surgeon: Radiologist, Medication, MD;  Location: [place];  Service: Radiology;  Laterality: N/A;  . RADIOLOGY WITH ANESTHESIA N/A 08/24/2019   Procedure: MRI WITH ANESTHESIA CERVICAL SPINE;  Surgeon: Radiologist, Medication, MD;  Location: [place];  Service: Radiology;  Laterality: N/A;  . RADIOLOGY WITH ANESTHESIA N/A 10/02/2019   Procedure: MRI WITH ANESTHESIA CERVICAL WITHOUT CONTRAST;  Surgeon: Radiologist, Medication, MD;  Location: [place];  Service: Radiology;  Laterality: N/A;  . TUBAL LIGATION  1988   Social History   Occupational History  . Occupation: unemployed    Comment: used to work in a  [place] Use  . Smoking status: Current Some Day Smoker    Packs/day: 0.25    Years: 38.00    Pack years: 9.50    Types: Cigarettes  . Smokeless tobacco: Never Used  . Tobacco comment: 0-4 cigs/day   Vaping Use  . Vaping Use: Never used  Substance  and Sexual Activity  . Alcohol use: Yes    Alcohol/week: 4.0 standard drinks    Types: 4 Cans of beer per week  . Drug use: Not Currently  . Sexual activity: Not Currently    Birth control/protection: Surgical    Comment: Hysterectomy

## 2020-10-05 ENCOUNTER — Other Ambulatory Visit: Payer: Self-pay | Admitting: Internal Medicine

## 2020-10-05 ENCOUNTER — Other Ambulatory Visit (HOSPITAL_COMMUNITY): Payer: Self-pay

## 2020-10-05 MED FILL — Amlodipine Besylate Tab 5 MG (Base Equivalent): ORAL | 90 days supply | Qty: 90 | Fill #0 | Status: AC

## 2020-10-06 ENCOUNTER — Other Ambulatory Visit (HOSPITAL_COMMUNITY): Payer: Self-pay

## 2020-10-11 ENCOUNTER — Other Ambulatory Visit (HOSPITAL_COMMUNITY): Payer: Self-pay

## 2020-10-11 ENCOUNTER — Other Ambulatory Visit: Payer: Self-pay

## 2020-10-11 ENCOUNTER — Ambulatory Visit (HOSPITAL_COMMUNITY)
Admission: EM | Admit: 2020-10-11 | Discharge: 2020-10-11 | Disposition: A | Discharge status: Home / Self Care | Payer: 59 | Attending: Physician Assistant | Admitting: Physician Assistant

## 2020-10-11 ENCOUNTER — Encounter (HOSPITAL_COMMUNITY): Payer: Self-pay

## 2020-10-11 DIAGNOSIS — R112 Nausea with vomiting, unspecified: Secondary | ICD-10-CM | POA: Insufficient documentation

## 2020-10-11 DIAGNOSIS — R197 Diarrhea, unspecified: Secondary | ICD-10-CM | POA: Diagnosis present

## 2020-10-11 DIAGNOSIS — R1013 Epigastric pain: Secondary | ICD-10-CM | POA: Insufficient documentation

## 2020-10-11 LAB — CBC WITH DIFFERENTIAL/PLATELET
Abs Immature Granulocytes: 0.02 10*3/uL (ref 0.00–0.07)
Basophils Absolute: 0 10*3/uL (ref 0.0–0.1)
Basophils Relative: 0 %
Eosinophils Absolute: 0.2 10*3/uL (ref 0.0–0.5)
Eosinophils Relative: 2 %
HCT: 37.1 % (ref 36.0–46.0)
Hemoglobin: 11.7 g/dL — ABNORMAL LOW (ref 12.0–15.0)
Immature Granulocytes: 0 %
Lymphocytes Relative: 29 %
Lymphs Abs: 2.3 10*3/uL (ref 0.7–4.0)
MCH: 22.4 pg — ABNORMAL LOW (ref 26.0–34.0)
MCHC: 31.5 g/dL (ref 30.0–36.0)
MCV: 71.1 fL — ABNORMAL LOW (ref 80.0–100.0)
Monocytes Absolute: 0.6 10*3/uL (ref 0.1–1.0)
Monocytes Relative: 7 %
Neutro Abs: 4.9 10*3/uL (ref 1.7–7.7)
Neutrophils Relative %: 62 %
Platelets: 300 10*3/uL (ref 150–400)
RBC: 5.22 MIL/uL — ABNORMAL HIGH (ref 3.87–5.11)
RDW: 14.7 % (ref 11.5–15.5)
WBC: 7.9 10*3/uL (ref 4.0–10.5)
nRBC: 0 % (ref 0.0–0.2)

## 2020-10-11 LAB — BASIC METABOLIC PANEL
Anion gap: 12 (ref 5–15)
BUN: 7 mg/dL (ref 6–20)
CO2: 27 mmol/L (ref 22–32)
Calcium: 9 mg/dL (ref 8.9–10.3)
Chloride: 103 mmol/L (ref 98–111)
Creatinine, Ser: 0.62 mg/dL (ref 0.44–1.00)
GFR, Estimated: >60 mL/min (ref >60)
Glucose, Bld: 84 mg/dL (ref 70–99)
Potassium: 3 mmol/L — ABNORMAL LOW (ref 3.5–5.1)
Sodium: 142 mmol/L (ref 135–145)

## 2020-10-11 LAB — LIPASE, BLOOD: Lipase: 28 U/L (ref 11–51)

## 2020-10-11 MED ORDER — ONDANSETRON 4 MG PO TBDP
ORAL_TABLET | ORAL | Status: AC
  Filled 2020-10-11: qty 1

## 2020-10-11 MED ORDER — ONDANSETRON 4 MG PO TBDP
4.0000 mg | ORAL_TABLET | Freq: Once | ORAL | Status: AC
  Administered 2020-10-11: 4 mg via ORAL

## 2020-10-11 MED ORDER — ONDANSETRON 4 MG PO TBDP
4.0000 mg | ORAL_TABLET | Freq: Three times a day (TID) | ORAL | 0 refills | Status: DC | PRN
  Filled 2020-10-11: qty 18, 21d supply, fill #0

## 2020-10-11 NOTE — ED Provider Notes (Signed)
[name]    CSN: [id number] Arrival date & time: 10/11/20  5329      History   Chief Complaint Chief Complaint  Patient presents with  . Abdominal Pain  . Nausea  . Diarrhea    HPI [name] is a 60 y.o. female.   Patient reports a 1 day history of abdominal pain.  She reports a similar episode several days ago but this was short-lived and resolved without intervention and she was asymptomatic for more than 48 hours.  Yesterday, after eating a plate made at her work, she developed severe abdominal pain.  Abdominal pain was rated 10 on a 0-10 pain scale but is gradually been improving and is currently rated 8 on a 0-10 pain scale, localized to epigastrium without radiation, described as sharp/stabbing, no aggravating or alleviating factors identified.  She reports associated nausea and vomiting with last episode of emesis yesterday described as food contents.  She reports associated diarrhea described as watery without blood or mucus.  She denies history of gastrointestinal disorder.  She does drink alcohol but denies any significant increase in alcohol consumption compared to baseline.  She denies any recent travel, new medications, antibiotic use.  She denies any known sick contacts.  She is not concerned for COVID-19 as she is up-to-date on vaccination and booster and is tested regularly at work; has been tested and had negative result since onset of symptoms.     Past Medical History:  Diagnosis Date  . Anemia   . Arthritis    "neck, hips" (04/25/2018)  . Claustrophobia   . Daily headache   . GERD (gastroesophageal reflux disease)   . Hiatal hernia   . Hyperlipidemia    "hx" (04/25/2018)  . Hypertension   . Plantar fasciitis    hx - left foot  . Stroke [facility]) 05/2014   "mini stroke" (04/25/2018)    Patient Active Problem List   Diagnosis Date Noted  . 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/28/2020  . Chronic diarrhea 09/14/2017  . H/O TIA  (transient ischemic attack) and stroke 06/12/2014  . Cervical radiculitis 02/12/2014  . Essential hypertension, benign 01/24/2010  . Microcytic anemia 01/11/2010  . Hyperlipemia 05/28/2007  . TOBACCO ABUSE 05/28/2007  . GERD 05/28/2007    Past Surgical History:  Procedure Laterality Date  . ABDOMINAL HYSTERECTOMY  12/09   Secondary to fibroids  . ARTERY BIOPSY Right 04/10/2018   Procedure: BIOPSY TEMPORAL ARTERY;  Surgeon: Rosetta Posner, MD;  Location: [place];  Service: Vascular;  Laterality: Right;  . [place]; 1988  . COLONOSCOPY    . FOOT SURGERY Left 03/2017   Bone Spurs Removed  . RADIOLOGY WITH ANESTHESIA N/A 04/26/2018   Procedure: MRI WITH ANESTHESIA;  Surgeon: Radiologist, Medication, MD;  Location: [place];  Service: Radiology;  Laterality: N/A;  . RADIOLOGY WITH ANESTHESIA N/A 08/24/2019   Procedure: MRI WITH ANESTHESIA CERVICAL SPINE;  Surgeon: Radiologist, Medication, MD;  Location: [place];  Service: Radiology;  Laterality: N/A;  . RADIOLOGY WITH ANESTHESIA N/A 10/02/2019   Procedure: MRI WITH ANESTHESIA CERVICAL WITHOUT CONTRAST;  Surgeon: Radiologist, Medication, MD;  Location: [place];  Service: Radiology;  Laterality: N/A;  . TUBAL LIGATION  1988    OB History   No obstetric history on file.      Home Medications    Prior to Admission medications   Medication Sig Start Date End Date Taking? Authorizing Provider  ondansetron (ZOFRAN ODT) 4 MG disintegrating tablet  Take 1 tablet (4 mg total) by mouth every 8 (eight) hours as needed for nausea or vomiting. 10/11/20  Yes Beecher Furio, Derry Skill, PA-C  acetaminophen (TYLENOL) 500 MG tablet Take 1 tablet (500 mg total) by mouth every 6 (six) hours as needed for moderate pain. 10/16/19   Corena Herter, PA-C  AMBULATORY NON FORMULARY MEDICATION Medication Name: nitroglycerin 0.125 mg ointment mixed with 2% lidocaine three times a day for 6-8 weeks 05/09/18   Bartholomew Crews, MD  amLODipine (NORVASC) 5 MG tablet TAKE  1 TABLET (5 MG TOTAL) BY MOUTH DAILY. 06/09/20 06/09/21  Horald Pollen, MD  buPROPion (WELLBUTRIN SR) 150 MG 12 hr tablet TAKE 1 TABLET (150 MG TOTAL) BY MOUTH DAILY. 06/09/20 06/09/21  Horald Pollen, MD  diclofenac Sodium (VOLTAREN) 1 % GEL APPLY 2 GRAMS TOPICALLY 4 TIMES DAILY 02/18/20 02/17/21  Masoudi, Dorthula Rue, MD  ferrous sulfate (IRON SUPPLEMENT) 325 (65 FE) MG tablet Take 1 tablet (325 mg total) by mouth daily with breakfast. 03/01/16   Bartholomew Crews, MD  HYDROcodone-acetaminophen (NORCO) 7.5-325 MG tablet Take 1 tablet by mouth every 6 (six) hours as needed for moderate pain. Patient not taking: Reported on 09/28/2020 05/08/20   Loura Halt A, NP  hydrocortisone (ANUSOL-HC) 2.5 % rectal cream Place 1 application rectally 2 (two) times daily. Patient taking differently: Place 1 application rectally 2 (two) times daily as needed for hemorrhoids. 05/09/18   Bartholomew Crews, MD  hydrOXYzine (ATARAX/VISTARIL) 10 MG tablet Take 1 tablet (10 mg total) by mouth 3 (three) times daily as needed for anxiety. Patient not taking: Reported on 09/28/2020 12/30/19   Mosetta Anis, MD  ibuprofen (ADVIL) 600 MG tablet Take 1 tablet (600 mg total) by mouth every 6 (six) hours as needed. 05/02/20   Raylene Everts, MD  influenza vac split quadrivalent PF (FLUARIX) 0.5 ML injection TO BE ADMINISTERED BY PHARMACIST Patient taking differently: TO BE ADMINISTERED BY PHARMACIST 03/26/20 03/26/21  Carlyle Basques, MD  lisinopril (ZESTRIL) 20 MG tablet Take 1 tablet (20 mg total) by mouth daily. 06/09/20   Horald Pollen, MD  lisinopril (ZESTRIL) 20 MG tablet TAKE 1 TABLET BY MOUTH ONCE DAILY. 11/20/19 11/19/20  Bartholomew Crews, MD  meloxicam (MOBIC) 15 MG tablet TAKE 1 TABLET (15 MG TOTAL) BY MOUTH DAILY. 09/16/20 09/16/21  Scot Jun, FNP  nicotine (NICODERM CQ - DOSED IN MG/24 HOURS) 14 mg/24hr patch PLACE 1 PATCH (14 MG TOTAL) ONTO THE SKIN DAILY. Patient not taking: No sig  reported 05/26/20 05/26/21  Horald Pollen, MD  omeprazole (PRILOSEC) 40 MG capsule TAKE 1 CAPSULE (40 MG TOTAL) BY MOUTH DAILY. 06/09/20 06/09/21  Horald Pollen, MD  potassium chloride SA (KLOR-CON) 20 MEQ tablet TAKE 1 TABLET BY MOUTH ONCE DAILY. 11/21/19 11/20/20  Bartholomew Crews, MD  pramoxine (PROCTOFOAM) 1 % foam Place 1 application rectally 3 (three) times daily as needed for anal itching. 11/25/18   [place], Carley D, DO  rosuvastatin (CRESTOR) 20 MG tablet TAKE 1 TABLET (20 MG TOTAL) BY MOUTH DAILY. 05/12/20 05/12/21  Horald Pollen, MD  tiZANidine (ZANAFLEX) 2 MG tablet TAKE 1-2 TABLETS (2-4 MG TOTAL) BY MOUTH EVERY 6 (SIX) HOURS AS NEEDED FOR MUSCLE SPASMS. 09/16/20 09/16/21  Scot Jun, FNP    Family History Family History  Problem Relation Age of Onset  . Cancer Mother 96       pancreatic cancer  . Pancreatic cancer Mother   . Heart disease Mother   .  Cancer Father 20       throat cancer  . Esophageal cancer Father   . Cancer Maternal Aunt 60       breast cancer  . Cancer Other        Died  from Colon cancer in 66's  . Colon cancer Maternal Uncle   . Colon cancer Paternal Uncle   . Heart disease Sister   . Rectal cancer Neg Hx   . Stomach cancer Neg Hx     Social History Social History   Tobacco Use  . Smoking status: Current Some Day Smoker    Packs/day: 0.25    Years: 38.00    Pack years: 9.50    Types: Cigarettes  . Smokeless tobacco: Never Used  . Tobacco comment: 0-4 cigs/day   Vaping Use  . Vaping Use: Never used  Substance Use Topics  . Alcohol use: Yes    Alcohol/week: 4.0 standard drinks    Types: 4 Cans of beer per week  . Drug use: Not Currently     Allergies   Aspirin, Hydromorphone hcl, and Latex   Review of Systems Review of Systems  Constitutional: Positive for appetite change and fatigue. Negative for activity change and fever.  HENT: Negative for congestion, sinus pressure, sneezing and sore throat.    Respiratory: Negative for cough and shortness of breath.   Cardiovascular: Negative for chest pain.  Gastrointestinal: Positive for abdominal pain, diarrhea, nausea and vomiting. Negative for blood in stool and constipation.  Genitourinary: Negative for flank pain, frequency, pelvic pain and urgency.  Musculoskeletal: Negative for arthralgias and myalgias.  Neurological: Positive for headaches. Negative for dizziness and light-headedness.     Physical Exam Triage Vital Signs ED Triage Vitals  Enc Vitals Group     BP 10/11/20 0925 (!) 153/91     Pulse Rate 10/11/20 0924 84     Resp 10/11/20 0924 19     Temp 10/11/20 0924 98.9 F (37.2 C)     Temp src --      SpO2 10/11/20 0924 99 %     Weight --      Height --      Head Circumference --      Peak Flow --      Pain Score 10/11/20 0922 10     Pain Loc --      Pain Edu? --      Excl. in [place]? --    No data found.  Updated Vital Signs BP (!) 153/91   Pulse 84   Temp 98.9 F (37.2 C)   Resp 19   LMP  (LMP Unknown)   SpO2 99%   Visual Acuity Right Eye Distance:   Left Eye Distance:   Bilateral Distance:    Right Eye Near:   Left Eye Near:    Bilateral Near:     Physical Exam Vitals reviewed.  Constitutional:      General: She is awake. She is not in acute distress.    Appearance: Normal appearance. She is not ill-appearing.     Comments: Very pleasant female appears stated age in no acute distress  HENT:     Head: Normocephalic and atraumatic.     Mouth/Throat:     Mouth: Mucous membranes are moist.  Cardiovascular:     Rate and Rhythm: Normal rate and regular rhythm.     Heart sounds: No murmur heard.   Pulmonary:     Effort: Pulmonary effort is normal.  Breath sounds: Normal breath sounds. No wheezing, rhonchi or rales.     Comments: Clear to auscultation bilaterally Abdominal:     General: Bowel sounds are normal.     Palpations: Abdomen is soft.     Tenderness: There is abdominal tenderness in  the epigastric area. There is no right CVA tenderness, left CVA tenderness, guarding or rebound.     Comments: Mild tenderness to palpation and epigastrium.  No evidence of acute abdomen on physical exam.  Psychiatric:        Behavior: Behavior is cooperative.      UC Treatments / Results  Labs (all labs ordered are listed, but only abnormal results are displayed) Labs Reviewed  BASIC METABOLIC PANEL  CBC WITH DIFFERENTIAL/PLATELET  LIPASE, BLOOD    EKG   Radiology No results found.  Procedures Procedures (including critical care time)  Medications Ordered in UC Medications  ondansetron (ZOFRAN-ODT) disintegrating tablet 4 mg (4 mg Oral Given 10/11/20 0959)    Initial Impression / Assessment and Plan / UC Course  I have reviewed the triage vital signs and the nursing notes.  Pertinent labs & imaging results that were available during my care of the patient were reviewed by me and considered in my medical decision making (see chart for details).     Patient had episode of emesis during visit today was given Zofran with improvement of symptoms.  She was provided a prescription to be used at home.  Basic labs obtained today-results pending.  Discussed that if she has any significant leukocytosis, electrolyte abnormalities, evidence of pancreatitis you need to go to the hospital.  Discussed the importance of eating a bland diet and drinking plenty of fluid.  Discussed we are unable to obtain imaging of abdomen and if she has any recurrent symptoms or worsening pain that she is to go to the hospital for further evaluation.  She was provided work excuse note as requested.  Strict return precautions given to which patient expressed understanding.  Final Clinical Impressions(s) / UC Diagnoses   Final diagnoses:  Abdominal pain, epigastric  Nausea vomiting and diarrhea     Discharge Instructions     Use Zofran to ensure you are getting enough fluids to help with nausea.  Make  sure you eat a bland diet (brat diet standing for bananas, rice, sauce, toast).  Avoid spicy, acidic, fatty foods.  We will be in touch with your lab results once we have them.  If you have any worsening symptoms you need to go to the hospital for further evaluation as we discussed.    ED Prescriptions    Medication Sig Dispense Auth. Provider   ondansetron (ZOFRAN ODT) 4 MG disintegrating tablet Take 1 tablet (4 mg total) by mouth every 8 (eight) hours as needed for nausea or vomiting. 20 tablet Nyaja [place], Derry Skill, PA-C     PDMP not reviewed this encounter.   Terrilee Croak, PA-C 10/11/20 1012

## 2020-10-11 NOTE — Discharge Instructions (Signed)
Use Zofran to ensure you are getting enough fluids to help with nausea.  Make sure you eat a bland diet (brat diet standing for bananas, rice, sauce, toast).  Avoid spicy, acidic, fatty foods.  We will be in touch with your lab results once we have them.  If you have any worsening symptoms you need to go to the hospital for further evaluation as we discussed.

## 2020-10-11 NOTE — ED Triage Notes (Signed)
Pt in with c/o vomiting, diarrhea, abdominal pain that has been going on since Thursday  Pt has not had medication for sxs

## 2020-10-19 ENCOUNTER — Telehealth: Payer: Self-pay

## 2020-10-19 ENCOUNTER — Other Ambulatory Visit: Payer: Self-pay

## 2020-10-19 ENCOUNTER — Other Ambulatory Visit (HOSPITAL_COMMUNITY): Payer: Self-pay

## 2020-10-19 MED ORDER — IBUPROFEN 600 MG PO TABS
600.0000 mg | ORAL_TABLET | Freq: Four times a day (QID) | ORAL | 0 refills | Status: DC | PRN
  Filled 2020-10-19: qty 30, 8d supply, fill #0

## 2020-10-19 NOTE — Telephone Encounter (Signed)
Patient called she is requesting rx refill for ibuprofen call back:[phone]

## 2020-10-19 NOTE — Telephone Encounter (Signed)
Ok thanks 

## 2020-10-19 NOTE — Telephone Encounter (Signed)
Sent in to pharmacy.  

## 2020-10-19 NOTE — Telephone Encounter (Signed)
Please advise 

## 2020-10-20 ENCOUNTER — Other Ambulatory Visit (HOSPITAL_COMMUNITY): Payer: Self-pay

## 2020-10-20 ENCOUNTER — Encounter: Payer: Self-pay | Admitting: Orthopaedic Surgery

## 2020-10-20 ENCOUNTER — Ambulatory Visit (INDEPENDENT_AMBULATORY_CARE_PROVIDER_SITE_OTHER): Payer: 59 | Admitting: Orthopaedic Surgery

## 2020-10-20 ENCOUNTER — Other Ambulatory Visit: Payer: Self-pay

## 2020-10-20 DIAGNOSIS — M545 Low back pain, unspecified: Secondary | ICD-10-CM | POA: Insufficient documentation

## 2020-10-20 MED ORDER — PREDNISONE 10 MG PO TABS
ORAL_TABLET | Freq: Every day | ORAL | 0 refills | Status: DC
Start: 2020-10-20 — End: 2020-12-06
  Filled 2020-10-20: qty 21, 6d supply, fill #0

## 2020-10-20 NOTE — Progress Notes (Signed)
Office Visit Note   Patient: [name]           Date of Birth: Oct 04, 1960           MRN: [id number] Visit Date: 10/20/2020              Requested by: Horald Pollen, [place],  [place] [address] PCP: Horald Pollen, MD   Assessment & Plan: Visit Diagnoses:  1. Acute left-sided low back pain, unspecified whether sciatica present     Plan: Patient was to continue working she has couple days she is going to take off for vacation next week the rest.  We will send in prednisone Dosepak she will take with food.  No history of diabetes and her glucose levels are normal from chart review.  No red flags found today.  We will set up for some physical therapy at [facility] outpatient and recheck her again in 5 weeks.  Follow-Up Instructions: Return in about 5 weeks (around 11/24/2020).   Orders:  No orders of the defined types were placed in this encounter.  Meds ordered this encounter  Medications  . predniSONE (STERAPRED UNI-PAK 21 TAB) 10 MG (21) TBPK tablet    Sig: Take 6,5,4,3,2,1 tablets daily with food. First day 6 tablets next day 5,etc.    Dispense:  21 tablet    Refill:  0      Procedures: No procedures performed   Clinical Data: No additional findings.   Subjective: Chief Complaint  Patient presents with  . Lower Back - Pain    HPI 60 year old female returns with significant back pain started 09/16/2020 when she went to the emergency room.  She has had back and left buttocks pain that radiates into her leg.  She states she had to leave work and had trouble bending turning twisting.  She denies any symptoms in the right leg no bowel bladder symptoms or chills or fever.  She does have foraminal stenosis cervical spine progressive at C6-7.  She states the neck not bothering her as much as the back currently.  On 09/16/2020 she was treated with Zanaflex, meloxicam.  Having great difficulty bending.  She gets relief with supine or sitting in  a chair with her back supported.  Lumbar x-rays last year were unremarkable.  Review of Systems Updated unchanged from 09/28/2020 office visit.  Objective: Vital Signs: BP 130/81   Pulse 81   Ht 4\' 9"  (1.448 m)   Wt 130 lb (59 kg)   LMP  (LMP Unknown)   BMI 28.13 kg/m   Physical Exam Constitutional:      Appearance: She is well-developed.  HENT:     Head: Normocephalic.     Right Ear: External ear normal.     Left Ear: External ear normal.  Eyes:     Pupils: Pupils are equal, round, and reactive to light.  Neck:     Thyroid: No thyromegaly.     Trachea: No tracheal deviation.  Cardiovascular:     Rate and Rhythm: Normal rate.  Pulmonary:     Effort: Pulmonary effort is normal.  Abdominal:     Palpations: Abdomen is soft.  Skin:    General: Skin is warm and dry.  Neurological:     Mental Status: She is alert and oriented to person, place, and time.  Psychiatric:        Behavior: Behavior normal.     Ortho Exam patient has tenderness lumbar spine left  side left buttocks tenderness over the sciatic notch minimally over the trochanteric bursa.  Negative logroll of the hips knee and ankle jerk are 2+ and symmetrical.  Mild discomfort with straight leg raising at 90 degrees.  Specialty Comments:  No specialty comments available.  Imaging: No results found.   PMFS History: Patient Active Problem List   Diagnosis Date Noted  . Low back pain 10/20/2020  . 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/28/2020  . Chronic diarrhea 09/14/2017  . H/O TIA (transient ischemic attack) and stroke 06/12/2014  . Cervical radiculitis 02/12/2014  . Essential hypertension, benign 01/24/2010  . Microcytic anemia 01/11/2010  . Hyperlipemia 05/28/2007  . TOBACCO ABUSE 05/28/2007  . GERD 05/28/2007   Past Medical History:  Diagnosis Date  . Anemia   . Arthritis    "neck, hips" (04/25/2018)  . Claustrophobia   . Daily headache   . GERD (gastroesophageal reflux disease)   . Hiatal  hernia   . Hyperlipidemia    "hx" (04/25/2018)  . Hypertension   . Plantar fasciitis    hx - left foot  . Stroke [facility]) 05/2014   "mini stroke" (04/25/2018)    Family History  Problem Relation Age of Onset  . Cancer Mother 43       pancreatic cancer  . Pancreatic cancer Mother   . Heart disease Mother   . Cancer Father 25       throat cancer  . Esophageal cancer Father   . Cancer Maternal Aunt 60       breast cancer  . Cancer Other        Died  from Colon cancer in 21's  . Colon cancer Maternal Uncle   . Colon cancer Paternal Uncle   . Heart disease Sister   . Rectal cancer Neg Hx   . Stomach cancer Neg Hx     Past Surgical History:  Procedure Laterality Date  . ABDOMINAL HYSTERECTOMY  12/09   Secondary to fibroids  . ARTERY BIOPSY Right 04/10/2018   Procedure: BIOPSY TEMPORAL ARTERY;  Surgeon: Rosetta Posner, MD;  Location: [place];  Service: Vascular;  Laterality: Right;  . [place]; 1988  . COLONOSCOPY    . FOOT SURGERY Left 03/2017   Bone Spurs Removed  . RADIOLOGY WITH ANESTHESIA N/A 04/26/2018   Procedure: MRI WITH ANESTHESIA;  Surgeon: Radiologist, Medication, MD;  Location: [place];  Service: Radiology;  Laterality: N/A;  . RADIOLOGY WITH ANESTHESIA N/A 08/24/2019   Procedure: MRI WITH ANESTHESIA CERVICAL SPINE;  Surgeon: Radiologist, Medication, MD;  Location: [place];  Service: Radiology;  Laterality: N/A;  . RADIOLOGY WITH ANESTHESIA N/A 10/02/2019   Procedure: MRI WITH ANESTHESIA CERVICAL WITHOUT CONTRAST;  Surgeon: Radiologist, Medication, MD;  Location: [place];  Service: Radiology;  Laterality: N/A;  . TUBAL LIGATION  1988   Social History   Occupational History  . Occupation: unemployed    Comment: used to work in a [place] Use  . Smoking status: Current Some Day Smoker    Packs/day: 0.25    Years: 38.00    Pack years: 9.50    Types: Cigarettes  . Smokeless tobacco: Never Used  . Tobacco comment: 0-4 cigs/day   Vaping Use  .  Vaping Use: Never used  Substance and Sexual Activity  . Alcohol use: Yes    Alcohol/week: 4.0 standard drinks    Types: 4 Cans of beer per week  . Drug use: Not Currently  . Sexual activity: Not  Currently    Birth control/protection: Surgical    Comment: Hysterectomy

## 2020-10-20 NOTE — Addendum Note (Signed)
Addended by: Robyne Peers on: 10/20/2020 09:24 AM   Modules accepted: Orders

## 2020-10-26 NOTE — Telephone Encounter (Signed)
Have not received letter

## 2020-12-06 ENCOUNTER — Encounter: Payer: Self-pay | Admitting: Internal Medicine

## 2020-12-06 ENCOUNTER — Ambulatory Visit (INDEPENDENT_AMBULATORY_CARE_PROVIDER_SITE_OTHER): Payer: 59 | Admitting: Internal Medicine

## 2020-12-06 VITALS — BP 118/66 | HR 70 | Ht <= 58 in | Wt 135.8 lb

## 2020-12-06 DIAGNOSIS — D123 Benign neoplasm of transverse colon: Secondary | ICD-10-CM

## 2020-12-06 DIAGNOSIS — K529 Noninfective gastroenteritis and colitis, unspecified: Secondary | ICD-10-CM

## 2020-12-06 DIAGNOSIS — K8689 Other specified diseases of pancreas: Secondary | ICD-10-CM | POA: Diagnosis not present

## 2020-12-06 DIAGNOSIS — R634 Abnormal weight loss: Secondary | ICD-10-CM

## 2020-12-06 NOTE — Patient Instructions (Signed)
Call me when you have your new insurance

## 2020-12-06 NOTE — Progress Notes (Signed)
HISTORY OF PRESENT ILLNESS:  [name] is a 60 y.o. female with pancreatic insufficiency by history (chronic diarrhea, stearrhea, and weight loss) which responded promptly to pancreatic enzyme replacement therapy in the form of Creon.  On Creon the patient had resolution of diarrhea and bloating.  Also was able to regain weight.  She was last seen August 2021.  This year she been having difficulties getting her medicine covered through her insurance provider.  About 1 month ago she ran out of Creon.  Prior to that she was getting samples from our office due to insurance related obstacles.  Since having been out of creatinine over the past month she has had recurrent chronic diarrhea, bloating, and mild weight loss.  No new complaints.  Blood work from April 2022 showed hemoglobin 11.7 with MCV 71.  CT scan of the abdomen pelvis with contrast July 2021 did not show any pancreatic abnormalities.  She did undergo upper endoscopy and colonoscopy 2021, May.  REVIEW OF SYSTEMS:  All non-GI ROS negative unless otherwise stated in the HPI except for headaches, arthritis, back pain, anxiety  Past Medical History:  Diagnosis Date   Anemia    Arthritis    "neck, hips" (04/25/2018)   Claustrophobia    Daily headache    GERD (gastroesophageal reflux disease)    Hiatal hernia    Hyperlipidemia    "hx" (04/25/2018)   Hypertension    Plantar fasciitis    hx - left foot   Stroke ([place]) 05/2014   "mini stroke" (04/25/2018)    Past Surgical History:  Procedure Laterality Date   ABDOMINAL HYSTERECTOMY  12/09   Secondary to fibroids   ARTERY BIOPSY Right 04/10/2018   Procedure: BIOPSY TEMPORAL ARTERY;  Surgeon: Rosetta Posner, MD;  Location: [place] [facility];  Service: Vascular;  Laterality: Right;   [place]; [place] Left 03/2017   Bone Spurs Removed   RADIOLOGY WITH ANESTHESIA N/A 04/26/2018   Procedure: MRI WITH ANESTHESIA;  Surgeon: Radiologist, Medication, MD;   Location: [facility];  Service: Radiology;  Laterality: N/A;   RADIOLOGY WITH ANESTHESIA N/A 08/24/2019   Procedure: MRI WITH ANESTHESIA CERVICAL SPINE;  Surgeon: Radiologist, Medication, MD;  Location: [place];  Service: Radiology;  Laterality: N/A;   RADIOLOGY WITH ANESTHESIA N/A 10/02/2019   Procedure: MRI WITH ANESTHESIA CERVICAL WITHOUT CONTRAST;  Surgeon: Radiologist, Medication, MD;  Location: [place];  Service: Radiology;  Laterality: N/A;   TUBAL LIGATION  1988    Social History RAYLINN KOSAR  reports that she has been smoking cigarettes. She has a 9.50 pack-year smoking history. She has never used smokeless tobacco. She reports current alcohol use of about 4.0 standard drinks of alcohol per week. She reports previous drug use.  family history includes Cancer in an other family member; Cancer (age of onset: 23) in her maternal aunt; Cancer (age of onset: 20) in her mother; Cancer (age of onset: 1) in her father; Colon cancer in her maternal uncle and paternal uncle; Esophageal cancer in her father; Heart disease in her mother and sister; Pancreatic cancer in her mother.  Allergies  Allergen Reactions   Aspirin Hives   Hydromorphone Hcl Hives and Nausea And Vomiting   Latex Rash       PHYSICAL EXAMINATION: Vital signs: BP 118/66   Pulse 70   Ht 4\' 9"  (1.448 m)   Wt 135 lb 12.8 oz (61.6 kg)   LMP  (LMP  Unknown)   BMI 29.39 kg/m   Constitutional: generally well-appearing, no acute distress Psychiatric: alert and oriented x3, cooperative Eyes: extraocular movements intact, anicteric, conjunctiva pink Mouth: oral pharynx moist, no lesions Neck: supple no lymphadenopathy Cardiovascular: heart regular rate and rhythm, no murmur Lungs: clear to auscultation bilaterally Abdomen: soft, nontender, nondistended, no obvious ascites, no peritoneal signs, normal bowel sounds, no organomegaly Rectal: Omitted Extremities: no clubbing, cyanosis, or lower extremity edema bilaterally Skin: no  lesions on visible extremities Neuro: No focal deficits.  Cranial nerves intact  ASSESSMENT:  1.  Pancreatic insufficiency.  Recurrent problems with diarrhea, bloating, and weight loss enzyme therapy for 1 month. 2.  Difficulty obtaining pancreatic enzyme therapy due to insurance obstacles 3.  Colonoscopy May 2021 with diminutive sessile serrated polyp   PLAN:  1.  Refill Creon as previous.  My nurse will try to assist this regard.  Hopefully can work with her insurance company 2.  Routine office follow-up with you 3.  Surveillance colonoscopy around May 2020  Total time of 30 minutes spent preparing to see the patient, reviewing tests and x-rays, obtaining comprehensive history, performing comprehensive physical exam, counseling the patient regarding her above listed issues, ordering medications, and documenting clinical information in the health record

## 2020-12-20 ENCOUNTER — Other Ambulatory Visit (HOSPITAL_COMMUNITY): Payer: Self-pay

## 2020-12-20 ENCOUNTER — Other Ambulatory Visit: Payer: Self-pay | Admitting: Internal Medicine

## 2020-12-20 MED FILL — Bupropion HCl Tab ER 12HR 150 MG: ORAL | 90 days supply | Qty: 90 | Fill #0 | Status: AC

## 2020-12-20 MED FILL — Omeprazole Cap Delayed Release 40 MG: ORAL | 90 days supply | Qty: 90 | Fill #0 | Status: AC

## 2020-12-20 MED FILL — Rosuvastatin Calcium Tab 20 MG: ORAL | 90 days supply | Qty: 90 | Fill #0 | Status: AC

## 2020-12-21 ENCOUNTER — Other Ambulatory Visit: Payer: Self-pay | Admitting: Internal Medicine

## 2020-12-21 ENCOUNTER — Telehealth: Payer: Self-pay | Admitting: Internal Medicine

## 2020-12-21 ENCOUNTER — Other Ambulatory Visit (HOSPITAL_COMMUNITY): Payer: Self-pay

## 2020-12-21 NOTE — Telephone Encounter (Signed)
Inbound call from patient requesting samples of creon.

## 2020-12-22 ENCOUNTER — Encounter: Payer: Self-pay | Admitting: Emergency Medicine

## 2020-12-22 ENCOUNTER — Other Ambulatory Visit: Payer: Self-pay

## 2020-12-22 ENCOUNTER — Other Ambulatory Visit (HOSPITAL_COMMUNITY): Payer: Self-pay

## 2020-12-22 ENCOUNTER — Ambulatory Visit (INDEPENDENT_AMBULATORY_CARE_PROVIDER_SITE_OTHER): Payer: 59 | Admitting: Emergency Medicine

## 2020-12-22 VITALS — BP 120/80 | HR 81 | Temp 98.8°F | Ht <= 58 in | Wt 138.8 lb

## 2020-12-22 DIAGNOSIS — I10 Essential (primary) hypertension: Secondary | ICD-10-CM | POA: Diagnosis not present

## 2020-12-22 DIAGNOSIS — F17200 Nicotine dependence, unspecified, uncomplicated: Secondary | ICD-10-CM | POA: Diagnosis not present

## 2020-12-22 DIAGNOSIS — G44209 Tension-type headache, unspecified, not intractable: Secondary | ICD-10-CM | POA: Insufficient documentation

## 2020-12-22 MED ORDER — LISINOPRIL 30 MG PO TABS
30.0000 mg | ORAL_TABLET | Freq: Every day | ORAL | 3 refills | Status: DC
  Filled 2020-12-22: qty 90, 90d supply, fill #0
  Filled 2021-04-05: qty 90, 90d supply, fill #1
  Filled 2021-07-27 (×2): qty 90, 90d supply, fill #2

## 2020-12-22 NOTE — Assessment & Plan Note (Signed)
Normotensive in the office but elevated at home.  Continue amlodipine 5 mg daily.  Will increase lisinopril to 30 mg daily Advised to continue monitoring blood pressure readings at home daily for several weeks and keep a log.

## 2020-12-22 NOTE — Progress Notes (Signed)
[name] 60 y.o.   Chief Complaint  Patient presents with   Hypertension    Discuss blood pressure, per patient it is up and down   Headache    Per patient x 2 weeks     HISTORY OF PRESENT ILLNESS: This is a 60 y.o. female with history of hypertension on amlodipine 5 mg and lisinopril 20 mg daily complaining of blood pressure being up and down for the past couple weeks and also complaining of generalized intermittent throbbing headache without associated symptoms.  No head injuries.  Not taking any over-the-counter analgesics. No other complaints or medical concerns today.  Hypertension Associated symptoms include headaches. Pertinent negatives include no chest pain, palpitations or shortness of breath.  Headache  Pertinent negatives include no abdominal pain, coughing, fever, nausea, sore throat or vomiting. Her past medical history is significant for hypertension.    Prior to Admission medications   Medication Sig Start Date End Date Taking? Authorizing Provider  amLODipine (NORVASC) 5 MG tablet TAKE 1 TABLET (5 MG TOTAL) BY MOUTH DAILY. 06/09/20 06/09/21 Yes Raziya Aveni, Ines Bloomer, MD  ibuprofen (ADVIL) 600 MG tablet Take 1 tablet (600 mg total) by mouth every 6 (six) hours as needed. 10/19/20  Yes Marybelle Killings, MD  meloxicam (MOBIC) 15 MG tablet TAKE 1 TABLET (15 MG TOTAL) BY MOUTH DAILY. 09/16/20 09/16/21 Yes Scot Jun, FNP  omeprazole (PRILOSEC) 40 MG capsule TAKE 1 CAPSULE (40 MG TOTAL) BY MOUTH DAILY. 06/09/20 06/09/21 Yes Darron Stuck, Ines Bloomer, MD  rosuvastatin (CRESTOR) 20 MG tablet TAKE 1 TABLET (20 MG TOTAL) BY MOUTH DAILY. 05/12/20 05/12/21 Yes Floraine Buechler, Ines Bloomer, MD  buPROPion (WELLBUTRIN SR) 150 MG 12 hr tablet TAKE 1 TABLET (150 MG TOTAL) BY MOUTH DAILY. Patient not taking: Reported on 12/22/2020 06/09/20 06/09/21  Horald Pollen, MD  diclofenac Sodium (VOLTAREN) 1 % GEL APPLY 2 GRAMS TOPICALLY 4 TIMES DAILY Patient not taking: Reported on  12/22/2020 02/18/20 02/17/21  Masoudi, Dorthula Rue, MD  lisinopril (ZESTRIL) 20 MG tablet TAKE 1 TABLET BY MOUTH ONCE DAILY. 11/20/19 11/19/20  Bartholomew Crews, MD  ondansetron (ZOFRAN ODT) 4 MG disintegrating tablet Take 1 tablet (4 mg total) by mouth every 8 (eight) hours as needed for nausea or vomiting. Patient not taking: Reported on 12/22/2020 10/11/20   Raspet, Junie Panning K, PA-C  tiZANidine (ZANAFLEX) 2 MG tablet TAKE 1-2 TABLETS (2-4 MG TOTAL) BY MOUTH EVERY 6 (SIX) HOURS AS NEEDED FOR MUSCLE SPASMS. 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/28/2020   Chronic diarrhea 09/14/2017   H/O TIA (transient ischemic attack) and stroke 06/12/2014   Cervical radiculitis 02/12/2014   Essential hypertension, benign 01/24/2010   Microcytic anemia 01/11/2010   Hyperlipemia 05/28/2007   TOBACCO ABUSE 05/28/2007   GERD 05/28/2007    Past Medical History:  Diagnosis Date   Anemia    Arthritis    "neck, hips" (04/25/2018)   Claustrophobia    Daily headache    GERD (gastroesophageal reflux disease)    Hiatal hernia    Hyperlipidemia    "hx" (04/25/2018)   Hypertension    Plantar fasciitis    hx - left foot   Stroke ([place]) 05/2014   "  mini stroke" (04/25/2018)    Past Surgical History:  Procedure Laterality Date   ABDOMINAL HYSTERECTOMY  12/09   Secondary to fibroids   ARTERY BIOPSY Right 04/10/2018   Procedure: BIOPSY TEMPORAL ARTERY;  Surgeon: Rosetta Posner, MD;  Location: [facility];  Service: Vascular;  Laterality: Right;   [place]; [place] Left 03/2017   Bone Spurs Removed   RADIOLOGY WITH ANESTHESIA N/A 04/26/2018   Procedure: MRI WITH ANESTHESIA;  Surgeon:  Radiologist, Medication, MD;  Location: [facility];  Service: Radiology;  Laterality: N/A;   RADIOLOGY WITH ANESTHESIA N/A 08/24/2019   Procedure: MRI WITH ANESTHESIA CERVICAL SPINE;  Surgeon: Radiologist, Medication, MD;  Location: [place] [facility];  Service: Radiology;  Laterality: N/A;   RADIOLOGY WITH ANESTHESIA N/A 10/02/2019   Procedure: MRI WITH ANESTHESIA CERVICAL WITHOUT CONTRAST;  Surgeon: Radiologist, Medication, MD;  Location: [facility];  Service: Radiology;  Laterality: N/A;   TUBAL LIGATION  1988    Social History   Socioeconomic History   Marital status: Divorced    Spouse name: Not on file   Number of children: Not on file   Years of education: Not on file   Highest education level: Not on file  Occupational History   Occupation: unemployed    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  Other Topics Concern   Not on file  Social History Narrative   10 th grade education.   Social Determinants of Health   Financial Resource Strain: Not on file  Food Insecurity: Not on file  Transportation Needs: Not on file  Physical Activity: Not on file  Stress: Not on file  Social Connections: Not on file  Intimate Partner Violence: Not on file    Family History  Problem Relation Age of Onset   Cancer Mother 47       pancreatic cancer   Pancreatic cancer Mother    Heart disease Mother    Cancer Father 110       throat cancer   Esophageal cancer Father    Cancer Maternal Aunt 47       breast cancer   Cancer Other        Died   from Colon cancer in 60's   Colon cancer Maternal Uncle    Colon cancer Paternal Uncle    Heart disease Sister    Rectal cancer Neg Hx    Stomach cancer Neg Hx      Review of Systems  Constitutional: Negative.  Negative for chills and fever.  HENT: Negative.  Negative for congestion and sore throat.   Respiratory: Negative.  Negative for cough and shortness of breath.   Cardiovascular: Negative.  Negative for chest pain and palpitations.  Gastrointestinal: Negative.  Negative for abdominal pain, diarrhea, nausea and vomiting.  Genitourinary: Negative.  Negative for dysuria and hematuria.  Musculoskeletal: Negative.   Skin: Negative.  Negative for rash.  Neurological:  Positive for headaches.  All other systems reviewed and are negative.  Today's Vitals   12/22/20 1531  BP: 120/80  Pulse: 81  Temp: 98.8 F (37.1 C)  TempSrc: Oral  SpO2: 98%  Weight: 138 lb 12.8 oz (63 kg)  Height: 4\' 9"  (1.448 m)   Body mass index is 30.04 kg/m. Wt Readings from Last 3 Encounters:  12/22/20 138 lb 12.8 oz (63 kg)  12/06/20 135 lb 12.8 oz (61.6 kg)  10/20/20 130 lb (59 kg)    Physical Exam Vitals reviewed.  Constitutional:      Appearance: Normal appearance. She is well-developed.  HENT:     Head: Normocephalic.  Eyes:     Extraocular Movements: Extraocular movements intact.     Pupils: Pupils are equal, round, and reactive to light.  Cardiovascular:     Rate and Rhythm: Normal rate and regular rhythm.     Pulses: Normal pulses.     Heart sounds: Normal heart sounds.  Pulmonary:     Effort: Pulmonary effort is normal.     Breath sounds: Normal breath sounds.  Musculoskeletal:        General: Normal range of motion.     Cervical back: Normal range of motion and neck supple.     Right lower leg: No edema.     Left lower leg: No edema.  Skin:    General: Skin is warm and dry.     Capillary Refill: Capillary refill takes less than 2 seconds.  Neurological:     General: No  focal deficit present.     Mental Status: She is alert and oriented to person, place, and time.  Psychiatric:        Mood and Affect: Mood normal.        Behavior: Behavior normal.     ASSESSMENT & PLAN: A total of 30 minutes was spent with the patient and counseling/coordination of care regarding hypertension and cardiovascular risk associated with this condition, review of all medications and changes made, need to increase dose of lisinopril to 30 mg daily, advised to monitor blood pressure readings at home daily for the next several weeks and keep a log, cardiovascular and cancer risks associated with smoking, smoking cessation advice given, health maintenance items, review of most recent office visit notes, review of most recent blood work results, differential diagnosis of headache and treatment options including over-the-counter Advil and/or Tylenol, prognosis documentation and need for follow-up.  Essential hypertension, benign Normotensive in the office but elevated at home.  Continue amlodipine 5 mg daily.  Will increase lisinopril to 30 mg daily Advised to continue monitoring blood pressure readings at home daily for several weeks and keep a log.  Current smoker Cardiovascular and cancer risks associated with smoking discussed. Smoking cessation advice given. Angi was seen today for hypertension and headache.  Diagnoses and all orders for this visit:  Essential hypertension, benign -     CBC with Differential/Platelet -     Comprehensive metabolic panel -     TSH -     Sedimentation rate -     lisinopril (ZESTRIL) 30 MG tablet; Take 1 tablet (30 mg total) by mouth daily.  Tension headache -     CBC with Differential/Platelet -     Comprehensive metabolic panel -     TSH -     Sedimentation rate  Current smoker  Patient Instructions  Take Tylenol  and or Advil as needed for headaches. Continue amlodipine 5 mg daily. Increase lisinopril to 30 mg daily. Monitor blood  pressure readings at home daily for the next couple of weeks and keep a log. Follow-up in 6 months but earlier as needed. Tension Headache, Adult A tension headache is a feeling of pain, pressure, or aching in the head. It is often felt over the front and sides of the head. Tension headaches can lastfrom 30 minutes to several days. What are the causes? The cause of this condition is not known. Sometimes, tension headaches are brought on by stress, worry (anxiety), or depression. Other things that may set them off include: Alcohol. Too much caffeine or caffeine withdrawal. Colds, flu, or sinus infections. Dental problems. This can include clenching your teeth. Being tired. Holding your head and neck in the same position for a long time, such as while using a computer. Smoking. Arthritis in the neck. What are the signs or symptoms? Feeling pressure around the head. A dull ache in the head. Pain over the front and sides of the head. Feeling sore or tender in the muscles of the head, neck, and shoulders. How is this treated? This condition may be treated with lifestyle changes and with medicines thathelp relieve symptoms. Follow these instructions at home: Managing pain Take over-the-counter and prescription medicines only as told by your doctor. When you have a headache, lie down in a dark, quiet room. If told, put ice on your head and neck. To do this: Put ice in a plastic bag. Place a towel between your skin and the bag. Leave the ice on for 20 minutes, 2-3 times a day. Take off the ice if your skin turns bright red. This is very important. If you cannot feel pain, heat, or cold, you have a greater risk of damage to the area. If told, put heat on the back of your neck. Do this as often as told by your doctor. Use the heat source that your doctor recommends, such as a moist heat pack or a heating pad. Place a towel between your skin and the heat source. Leave the heat on for 20-30  minutes. Take off the heat if your skin turns bright red. This is very important. If you cannot feel pain, heat, or cold, you have a greater risk of getting burned. Eating and drinking Eat meals on a regular schedule. If you drink alcohol: Limit how much you have to: 0-1 drink a day for women who are not pregnant. 0-2 drinks a day for men. Know how much alcohol is in your drink. In the [place] one drink equals one 12 oz bottle of beer (355 mL), one 5 oz glass of wine (148 mL), or one 1 oz glass of hard liquor (44 mL). Drink enough fluid to keep your pee (urine) pale yellow. Do not use a lot of caffeine, or stop using caffeine. Lifestyle Get 7-9 hours of sleep each night. Or get the amount of sleep that your doctor tells you to. At bedtime, keep computers, phones, and tablets out of your room. Find ways to lessen your stress. This may include: Exercise. Deep breathing. Yoga. Listening to music. Thinking positive thoughts. Sit up straight. Try to relax your muscles. Do not smoke or use any products that contain nicotine or tobacco. If you need help quitting, ask your doctor. General instructions  Avoid things that can bring on headaches. Keep a headache journal to see what may bring on headaches. For example, write  down: What you eat and drink. How much sleep you get. Any change to your diet or medicines. Keep all follow-up visits.  Contact a doctor if: Your headache does not get better. Your headache comes back. You have a headache, and sounds, light, or smells bother you. You feel like you may vomit, or you vomit. Your stomach hurts. Get help right away if: You all of a sudden get a very bad headache with any of these things: A stiff neck. Feeling like you may vomit. Vomiting. Feeling mixed up (confused). Feeling weak in one part or one side of your body. Having trouble seeing or speaking, or both. Feeling short of breath. A rash. Feeling very sleepy. Pain in your eye or  ear. Trouble walking or balancing. Feeling like you will faint, or you faint. Summary A tension headache is pain, pressure, or aching in your head. Tension headaches can last from 30 minutes to several days. Lifestyle changes and medicines may help relieve pain. This information is not intended to replace advice given to you by your health care provider. Make sure you discuss any questions you have with your healthcare provider. Document Revised: 03/11/2020 Document Reviewed: 03/11/2020 Elsevier Patient Education  2022 [place]. Hypertension, Adult High blood pressure (hypertension) is when the force of blood pumping through the arteries is too strong. The arteries are the blood vessels that carry blood from the heart throughout the body. Hypertension forces the heart to work harder to pump blood and may cause arteries to become narrow or stiff. Untreated or uncontrolled hypertension can cause a heart attack, heart failure, a stroke, kidney disease, and otherproblems. A blood pressure reading consists of a higher number over a lower number. Ideally, your blood pressure should be below 120/80. The first ("top") number is called the systolic pressure. It is a measure of the pressure in your arteries as your heart beats. The second ("bottom") number is called the diastolic pressure. It is a measure of the pressure in your arteries as theheart relaxes. What are the causes? The exact cause of this condition is not known. There are some conditions thatresult in or are related to high blood pressure. What increases the risk? Some risk factors for high blood pressure are under your control. The following factors may make you more likely to develop this condition: Smoking. Having type 2 diabetes mellitus, high cholesterol, or both. Not getting enough exercise or physical activity. Being overweight. Having too much fat, sugar, calories, or salt (sodium) in your diet. Drinking too much alcohol. Some  risk factors for high blood pressure may be difficult or impossible to change. Some of these factors include: Having chronic kidney disease. Having a family history of high blood pressure. Age. Risk increases with age. Race. You may be at higher risk if you are African American. Gender. Men are at higher risk than women before age 8. After age 96, women are at higher risk than men. Having obstructive sleep apnea. Stress. What are the signs or symptoms? High blood pressure may not cause symptoms. Very high blood pressure (hypertensive crisis) may cause: Headache. Anxiety. Shortness of breath. Nosebleed. Nausea and vomiting. Vision changes. Severe chest pain. Seizures. How is this diagnosed? This condition is diagnosed by measuring your blood pressure while you are seated, with your arm resting on a flat surface, your legs uncrossed, and your feet flat on the floor. The cuff of the blood pressure monitor will be placed directly against the skin of your upper arm at the  level of your heart. It should be measured at least twice using the same arm. Certain conditions cancause a difference in blood pressure between your right and left arms. Certain factors can cause blood pressure readings to be lower or higher than normal for a short period of time: When your blood pressure is higher when you are in a health care provider's office than when you are at home, this is called white coat hypertension. Most people with this condition do not need medicines. When your blood pressure is higher at home than when you are in a health care provider's office, this is called masked hypertension. Most people with this condition may need medicines to control blood pressure. If you have a high blood pressure reading during one visit or you have normal blood pressure with other risk factors, you may be asked to: Return on a different day to have your blood pressure checked again. Monitor your blood pressure at  home for 1 week or longer. If you are diagnosed with hypertension, you may have other blood or imaging tests to help your health care provider understand your overall risk for otherconditions. How is this treated? This condition is treated by making healthy lifestyle changes, such as eating healthy foods, exercising more, and reducing your alcohol intake. Your health care provider may prescribe medicine if lifestyle changes are not enough to get your blood pressure under control, and if: Your systolic blood pressure is above 130. Your diastolic blood pressure is above 80. Your personal target blood pressure may vary depending on your medicalconditions, your age, and other factors. Follow these instructions at home: Eating and drinking  Eat a diet that is high in fiber and potassium, and low in sodium, added sugar, and fat. An example eating plan is called the DASH (Dietary Approaches to Stop Hypertension) diet. To eat this way: Eat plenty of fresh fruits and vegetables. Try to fill one half of your plate at each meal with fruits and vegetables. Eat whole grains, such as whole-wheat pasta, brown rice, or whole-grain bread. Fill about one fourth of your plate with whole grains. Eat or drink low-fat dairy products, such as skim milk or low-fat yogurt. Avoid fatty cuts of meat, processed or cured meats, and poultry with skin. Fill about one fourth of your plate with lean proteins, such as fish, chicken without skin, beans, eggs, or tofu. Avoid pre-made and processed foods. These tend to be higher in sodium, added sugar, and fat. Reduce your daily sodium intake. Most people with hypertension should eat less than 1,500 mg of sodium a day. Do not drink alcohol if: Your health care provider tells you not to drink. You are pregnant, may be pregnant, or are planning to become pregnant. If you drink alcohol: Limit how much you use to: 0-1 drink a day for women. 0-2 drinks a day for men. Be aware of how  much alcohol is in your drink. In the [place] one drink equals one 12 oz bottle of beer (355 mL), one 5 oz glass of wine (148 mL), or one 1 oz glass of hard liquor (44 mL).  Lifestyle  Work with your health care provider to maintain a healthy body weight or to lose weight. Ask what an ideal weight is for you. Get at least 30 minutes of exercise most days of the week. Activities may include walking, swimming, or biking. Include exercise to strengthen your muscles (resistance exercise), such as Pilates or lifting weights, as part of your weekly exercise  routine. Try to do these types of exercises for 30 minutes at least 3 days a week. Do not use any products that contain nicotine or tobacco, such as cigarettes, e-cigarettes, and chewing tobacco. If you need help quitting, ask your health care provider. Monitor your blood pressure at home as told by your health care provider. Keep all follow-up visits as told by your health care provider. This is important.  Medicines Take over-the-counter and prescription medicines only as told by your health care provider. Follow directions carefully. Blood pressure medicines must be taken as prescribed. Do not skip doses of blood pressure medicine. Doing this puts you at risk for problems and can make the medicine less effective. Ask your health care provider about side effects or reactions to medicines that you should watch for. Contact a health care provider if you: Think you are having a reaction to a medicine you are taking. Have headaches that keep coming back (recurring). Feel dizzy. Have swelling in your ankles. Have trouble with your vision. Get help right away if you: Develop a severe headache or confusion. Have unusual weakness or numbness. Feel faint. Have severe pain in your chest or abdomen. Vomit repeatedly. Have trouble breathing. Summary Hypertension is when the force of blood pumping through your arteries is too strong. If this condition  is not controlled, it may put you at risk for serious complications. Your personal target blood pressure may vary depending on your medical conditions, your age, and other factors. For most people, a normal blood pressure is less than 120/80. Hypertension is treated with lifestyle changes, medicines, or a combination of both. Lifestyle changes include losing weight, eating a healthy, low-sodium diet, exercising more, and limiting alcohol. This information is not intended to replace advice given to you by your health care provider. Make sure you discuss any questions you have with your healthcare provider. Document Revised: 02/20/2018 Document Reviewed: 02/20/2018 Elsevier Patient Education  2022 [place], MD [facility]

## 2020-12-22 NOTE — Assessment & Plan Note (Signed)
Cardiovascular and cancer risks associated with smoking discussed. °Smoking cessation advice given. °

## 2020-12-22 NOTE — Patient Instructions (Signed)
Take Tylenol and or Advil as needed for headaches. Continue amlodipine 5 mg daily. Increase lisinopril to 30 mg daily. Monitor blood pressure readings at home daily for the next couple of weeks and keep a log. Follow-up in 6 months but earlier as needed. Tension Headache, Adult A tension headache is a feeling of pain, pressure, or aching in the head. It is often felt over the front and sides of the head. Tension headaches can lastfrom 30 minutes to several days. What are the causes? The cause of this condition is not known. Sometimes, tension headaches are brought on by stress, worry (anxiety), or depression. Other things that may set them off include: Alcohol. Too much caffeine or caffeine withdrawal. Colds, flu, or sinus infections. Dental problems. This can include clenching your teeth. Being tired. Holding your head and neck in the same position for a long time, such as while using a computer. Smoking. Arthritis in the neck. What are the signs or symptoms? Feeling pressure around the head. A dull ache in the head. Pain over the front and sides of the head. Feeling sore or tender in the muscles of the head, neck, and shoulders. How is this treated? This condition may be treated with lifestyle changes and with medicines thathelp relieve symptoms. Follow these instructions at home: Managing pain Take over-the-counter and prescription medicines only as told by your doctor. When you have a headache, lie down in a dark, quiet room. If told, put ice on your head and neck. To do this: Put ice in a plastic bag. Place a towel between your skin and the bag. Leave the ice on for 20 minutes, 2-3 times a day. Take off the ice if your skin turns bright red. This is very important. If you cannot feel pain, heat, or cold, you have a greater risk of damage to the area. If told, put heat on the back of your neck. Do this as often as told by your doctor. Use the heat source that your doctor  recommends, such as a moist heat pack or a heating pad. Place a towel between your skin and the heat source. Leave the heat on for 20-30 minutes. Take off the heat if your skin turns bright red. This is very important. If you cannot feel pain, heat, or cold, you have a greater risk of getting burned. Eating and drinking Eat meals on a regular schedule. If you drink alcohol: Limit how much you have to: 0-1 drink a day for women who are not pregnant. 0-2 drinks a day for men. Know how much alcohol is in your drink. In the [place] one drink equals one 12 oz bottle of beer (355 mL), one 5 oz glass of wine (148 mL), or one 1 oz glass of hard liquor (44 mL). Drink enough fluid to keep your pee (urine) pale yellow. Do not use a lot of caffeine, or stop using caffeine. Lifestyle Get 7-9 hours of sleep each night. Or get the amount of sleep that your doctor tells you to. At bedtime, keep computers, phones, and tablets out of your room. Find ways to lessen your stress. This may include: Exercise. Deep breathing. Yoga. Listening to music. Thinking positive thoughts. Sit up straight. Try to relax your muscles. Do not smoke or use any products that contain nicotine or tobacco. If you need help quitting, ask your doctor. General instructions  Avoid things that can bring on headaches. Keep a headache journal to see what may bring on headaches. For example,  write down: What you eat and drink. How much sleep you get. Any change to your diet or medicines. Keep all follow-up visits.  Contact a doctor if: Your headache does not get better. Your headache comes back. You have a headache, and sounds, light, or smells bother you. You feel like you may vomit, or you vomit. Your stomach hurts. Get help right away if: You all of a sudden get a very bad headache with any of these things: A stiff neck. Feeling like you may vomit. Vomiting. Feeling mixed up (confused). Feeling weak in one part or one  side of your body. Having trouble seeing or speaking, or both. Feeling short of breath. A rash. Feeling very sleepy. Pain in your eye or ear. Trouble walking or balancing. Feeling like you will faint, or you faint. Summary A tension headache is pain, pressure, or aching in your head. Tension headaches can last from 30 minutes to several days. Lifestyle changes and medicines may help relieve pain. This information is not intended to replace advice given to you by your health care provider. Make sure you discuss any questions you have with your healthcare provider. Document Revised: 03/11/2020 Document Reviewed: 03/11/2020 Elsevier Patient Education  2022 [place]. Hypertension, Adult High blood pressure (hypertension) is when the force of blood pumping through the arteries is too strong. The arteries are the blood vessels that carry blood from the heart throughout the body. Hypertension forces the heart to work harder to pump blood and may cause arteries to become narrow or stiff. Untreated or uncontrolled hypertension can cause a heart attack, heart failure, a stroke, kidney disease, and otherproblems. A blood pressure reading consists of a higher number over a lower number. Ideally, your blood pressure should be below 120/80. The first ("top") number is called the systolic pressure. It is a measure of the pressure in your arteries as your heart beats. The second ("bottom") number is called the diastolic pressure. It is a measure of the pressure in your arteries as theheart relaxes. What are the causes? The exact cause of this condition is not known. There are some conditions thatresult in or are related to high blood pressure. What increases the risk? Some risk factors for high blood pressure are under your control. The following factors may make you more likely to develop this condition: Smoking. Having type 2 diabetes mellitus, high cholesterol, or both. Not getting enough exercise or  physical activity. Being overweight. Having too much fat, sugar, calories, or salt (sodium) in your diet. Drinking too much alcohol. Some risk factors for high blood pressure may be difficult or impossible to change. Some of these factors include: Having chronic kidney disease. Having a family history of high blood pressure. Age. Risk increases with age. Race. You may be at higher risk if you are African American. Gender. Men are at higher risk than women before age 43. After age 4, women are at higher risk than men. Having obstructive sleep apnea. Stress. What are the signs or symptoms? High blood pressure may not cause symptoms. Very high blood pressure (hypertensive crisis) may cause: Headache. Anxiety. Shortness of breath. Nosebleed. Nausea and vomiting. Vision changes. Severe chest pain. Seizures. How is this diagnosed? This condition is diagnosed by measuring your blood pressure while you are seated, with your arm resting on a flat surface, your legs uncrossed, and your feet flat on the floor. The cuff of the blood pressure monitor will be placed directly against the skin of your upper arm at  the level of your heart. It should be measured at least twice using the same arm. Certain conditions cancause a difference in blood pressure between your right and left arms. Certain factors can cause blood pressure readings to be lower or higher than normal for a short period of time: When your blood pressure is higher when you are in a health care provider's office than when you are at home, this is called white coat hypertension. Most people with this condition do not need medicines. When your blood pressure is higher at home than when you are in a health care provider's office, this is called masked hypertension. Most people with this condition may need medicines to control blood pressure. If you have a high blood pressure reading during one visit or you have normal blood pressure with  other risk factors, you may be asked to: Return on a different day to have your blood pressure checked again. Monitor your blood pressure at home for 1 week or longer. If you are diagnosed with hypertension, you may have other blood or imaging tests to help your health care provider understand your overall risk for otherconditions. How is this treated? This condition is treated by making healthy lifestyle changes, such as eating healthy foods, exercising more, and reducing your alcohol intake. Your health care provider may prescribe medicine if lifestyle changes are not enough to get your blood pressure under control, and if: Your systolic blood pressure is above 130. Your diastolic blood pressure is above 80. Your personal target blood pressure may vary depending on your medicalconditions, your age, and other factors. Follow these instructions at home: Eating and drinking  Eat a diet that is high in fiber and potassium, and low in sodium, added sugar, and fat. An example eating plan is called the DASH (Dietary Approaches to Stop Hypertension) diet. To eat this way: Eat plenty of fresh fruits and vegetables. Try to fill one half of your plate at each meal with fruits and vegetables. Eat whole grains, such as whole-wheat pasta, brown rice, or whole-grain bread. Fill about one fourth of your plate with whole grains. Eat or drink low-fat dairy products, such as skim milk or low-fat yogurt. Avoid fatty cuts of meat, processed or cured meats, and poultry with skin. Fill about one fourth of your plate with lean proteins, such as fish, chicken without skin, beans, eggs, or tofu. Avoid pre-made and processed foods. These tend to be higher in sodium, added sugar, and fat. Reduce your daily sodium intake. Most people with hypertension should eat less than 1,500 mg of sodium a day. Do not drink alcohol if: Your health care provider tells you not to drink. You are pregnant, may be pregnant, or are planning  to become pregnant. If you drink alcohol: Limit how much you use to: 0-1 drink a day for women. 0-2 drinks a day for men. Be aware of how much alcohol is in your drink. In the [place] one drink equals one 12 oz bottle of beer (355 mL), one 5 oz glass of wine (148 mL), or one 1 oz glass of hard liquor (44 mL).  Lifestyle  Work with your health care provider to maintain a healthy body weight or to lose weight. Ask what an ideal weight is for you. Get at least 30 minutes of exercise most days of the week. Activities may include walking, swimming, or biking. Include exercise to strengthen your muscles (resistance exercise), such as Pilates or lifting weights, as part of your weekly  exercise routine. Try to do these types of exercises for 30 minutes at least 3 days a week. Do not use any products that contain nicotine or tobacco, such as cigarettes, e-cigarettes, and chewing tobacco. If you need help quitting, ask your health care provider. Monitor your blood pressure at home as told by your health care provider. Keep all follow-up visits as told by your health care provider. This is important.  Medicines Take over-the-counter and prescription medicines only as told by your health care provider. Follow directions carefully. Blood pressure medicines must be taken as prescribed. Do not skip doses of blood pressure medicine. Doing this puts you at risk for problems and can make the medicine less effective. Ask your health care provider about side effects or reactions to medicines that you should watch for. Contact a health care provider if you: Think you are having a reaction to a medicine you are taking. Have headaches that keep coming back (recurring). Feel dizzy. Have swelling in your ankles. Have trouble with your vision. Get help right away if you: Develop a severe headache or confusion. Have unusual weakness or numbness. Feel faint. Have severe pain in your chest or abdomen. Vomit  repeatedly. Have trouble breathing. Summary Hypertension is when the force of blood pumping through your arteries is too strong. If this condition is not controlled, it may put you at risk for serious complications. Your personal target blood pressure may vary depending on your medical conditions, your age, and other factors. For most people, a normal blood pressure is less than 120/80. Hypertension is treated with lifestyle changes, medicines, or a combination of both. Lifestyle changes include losing weight, eating a healthy, low-sodium diet, exercising more, and limiting alcohol. This information is not intended to replace advice given to you by your health care provider. Make sure you discuss any questions you have with your healthcare provider. Document Revised: 02/20/2018 Document Reviewed: 02/20/2018 Elsevier Patient Education  [place].

## 2020-12-23 ENCOUNTER — Other Ambulatory Visit (HOSPITAL_COMMUNITY): Payer: Self-pay

## 2020-12-23 LAB — COMPREHENSIVE METABOLIC PANEL
ALT: 51 U/L — ABNORMAL HIGH (ref 0–35)
AST: 31 U/L (ref 0–37)
Albumin: 4.2 g/dL (ref 3.5–5.2)
Alkaline Phosphatase: 74 U/L (ref 39–117)
BUN: 18 mg/dL (ref 6–23)
CO2: 26 mEq/L (ref 19–32)
Calcium: 9 mg/dL (ref 8.4–10.5)
Chloride: 106 mEq/L (ref 96–112)
Creatinine, Ser: 0.79 mg/dL (ref 0.40–1.20)
GFR: 81.97 mL/min (ref >60.00)
Glucose, Bld: 106 mg/dL — ABNORMAL HIGH (ref 70–99)
Potassium: 3.6 mEq/L (ref 3.5–5.1)
Sodium: 140 mEq/L (ref 135–145)
Total Bilirubin: 0.2 mg/dL (ref 0.2–1.2)
Total Protein: 7.1 g/dL (ref 6.0–8.3)

## 2020-12-23 LAB — CBC WITH DIFFERENTIAL/PLATELET
Basophils Absolute: 0 10*3/uL (ref 0.0–0.1)
Basophils Relative: 0.8 % (ref 0.0–3.0)
Eosinophils Absolute: 0.2 10*3/uL (ref 0.0–0.7)
Eosinophils Relative: 3.4 % (ref 0.0–5.0)
HCT: 32.3 % — ABNORMAL LOW (ref 36.0–46.0)
Hemoglobin: 10.2 g/dL — ABNORMAL LOW (ref 12.0–15.0)
Lymphocytes Relative: 45.6 % (ref 12.0–46.0)
Lymphs Abs: 3 10*3/uL (ref 0.7–4.0)
MCHC: 31.7 g/dL (ref 30.0–36.0)
MCV: 71.1 fl — ABNORMAL LOW (ref 78.0–100.0)
Monocytes Absolute: 0.5 10*3/uL (ref 0.1–1.0)
Monocytes Relative: 7.2 % (ref 3.0–12.0)
Neutro Abs: 2.8 10*3/uL (ref 1.4–7.7)
Neutrophils Relative %: 43 % (ref 43.0–77.0)
Platelets: 239 10*3/uL (ref 150.0–400.0)
RBC: 4.55 Mil/uL (ref 3.87–5.11)
RDW: 15.2 % (ref 11.5–15.5)
WBC: 6.6 10*3/uL (ref 4.0–10.5)

## 2020-12-23 LAB — TSH: TSH: 0.73 u[IU]/mL (ref 0.35–5.50)

## 2020-12-23 LAB — SEDIMENTATION RATE: Sed Rate: 33 mm/hr — ABNORMAL HIGH (ref 0–30)

## 2020-12-23 MED FILL — Pancrelipase (Lip-Prot-Amyl) DR Cap 36000-114000-180000 Unit: ORAL | 30 days supply | Qty: 330 | Fill #0 | Status: CN

## 2020-12-23 NOTE — Telephone Encounter (Signed)
Left message on vm that I would leave Creon samples up front to be picked up.

## 2020-12-24 ENCOUNTER — Other Ambulatory Visit (HOSPITAL_COMMUNITY): Payer: Self-pay

## 2020-12-24 ENCOUNTER — Other Ambulatory Visit: Payer: Self-pay | Admitting: Emergency Medicine

## 2020-12-24 ENCOUNTER — Telehealth: Payer: Self-pay

## 2020-12-24 MED ORDER — DICLOFENAC SODIUM 75 MG PO TBEC
75.0000 mg | DELAYED_RELEASE_TABLET | Freq: Two times a day (BID) | ORAL | 1 refills | Status: DC | PRN
  Filled 2020-12-24: qty 30, 15d supply, fill #0

## 2020-12-24 MED FILL — Pancrelipase (Lip-Prot-Amyl) DR Cap 36000-114000-180000 Unit: ORAL | 30 days supply | Qty: 330 | Fill #0 | Status: CN

## 2020-12-24 NOTE — Telephone Encounter (Signed)
During result note call, pt mentioned hip pain that she states she brought up during her OV on 12/22/20.  She would like a recommendation of next steps as medicating with Ibuprofen has not been helpful.  Please advise.

## 2020-12-24 NOTE — Telephone Encounter (Signed)
Pt notified of MD response & verb understanding.  Has no additional ques/concerns at this time.

## 2020-12-24 NOTE — Telephone Encounter (Signed)
Recommend to start Voltaren 75 mg twice a day as needed along with Tylenol.  New prescription sent to pharmacy of record.  Thanks.

## 2020-12-24 NOTE — Progress Notes (Signed)
Chronic anemia with normal recent colonoscopy done last year.  She may benefit from over-the-counter iron supplementation.  Thanks.

## 2021-01-14 ENCOUNTER — Other Ambulatory Visit (HOSPITAL_COMMUNITY): Payer: Self-pay

## 2021-02-02 ENCOUNTER — Encounter: Payer: Self-pay | Admitting: Emergency Medicine

## 2021-02-02 ENCOUNTER — Other Ambulatory Visit (HOSPITAL_COMMUNITY): Payer: Self-pay

## 2021-02-02 ENCOUNTER — Ambulatory Visit (INDEPENDENT_AMBULATORY_CARE_PROVIDER_SITE_OTHER): Payer: 59 | Admitting: Emergency Medicine

## 2021-02-02 ENCOUNTER — Other Ambulatory Visit: Payer: Self-pay

## 2021-02-02 VITALS — BP 126/86 | HR 99 | Temp 98.9°F | Ht <= 58 in | Wt 139.0 lb

## 2021-02-02 DIAGNOSIS — M5412 Radiculopathy, cervical region: Secondary | ICD-10-CM

## 2021-02-02 DIAGNOSIS — M542 Cervicalgia: Secondary | ICD-10-CM | POA: Diagnosis not present

## 2021-02-02 DIAGNOSIS — M4802 Spinal stenosis, cervical region: Secondary | ICD-10-CM

## 2021-02-02 DIAGNOSIS — M5442 Lumbago with sciatica, left side: Secondary | ICD-10-CM

## 2021-02-02 DIAGNOSIS — G8929 Other chronic pain: Secondary | ICD-10-CM

## 2021-02-02 DIAGNOSIS — M549 Dorsalgia, unspecified: Secondary | ICD-10-CM

## 2021-02-02 MED ORDER — DICLOFENAC SODIUM 75 MG PO TBEC
75.0000 mg | DELAYED_RELEASE_TABLET | Freq: Two times a day (BID) | ORAL | 1 refills | Status: DC | PRN
  Filled 2021-02-02: qty 30, 15d supply, fill #0

## 2021-02-02 MED ORDER — TRAMADOL HCL 50 MG PO TABS
50.0000 mg | ORAL_TABLET | Freq: Two times a day (BID) | ORAL | 0 refills | Status: AC | PRN
  Filled 2021-02-02: qty 15, 7d supply, fill #0

## 2021-02-02 NOTE — Progress Notes (Signed)
[name] 60 y.o.   Chief Complaint  Patient presents with   Back Pain    Back and neck pain    HISTORY OF PRESENT ILLNESS: This is a 60 y.o. 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  No new injuries. Sharp pain worse at night.  Presently has been taking ibuprofen with mild relief. No other associated symptoms.  Back Pain Pertinent negatives include no abdominal pain, chest pain, dysuria, fever or headaches.    Prior to Admission medications   Medication Sig Start Date End Date Taking? Authorizing Provider  amLODipine (NORVASC) 5 MG tablet TAKE 1 TABLET (5 MG TOTAL) BY MOUTH DAILY. 06/09/20 06/09/21 Yes Trueman Worlds, Ines Bloomer, MD  buPROPion (WELLBUTRIN SR) 150 MG 12 hr tablet TAKE 1 TABLET (150 MG TOTAL) BY MOUTH DAILY. 06/09/20 06/09/21 Yes Donnielle Addison, Ines Bloomer, MD  diclofenac (VOLTAREN) 75 MG EC tablet Take 1 tablet (75 mg total) by mouth 2 (two) times daily as needed. 12/24/20  Yes Horald Pollen, MD  lipase/protease/amylase (CREON) 36000 UNITS CPEP capsule TAKE 3 CAPSULES WITH [facility] MEAL AND 2 WITH A SNACK 08/13/20 08/13/21 Yes Irene Shipper, MD  lisinopril (ZESTRIL) 30 MG tablet Take 1 tablet (30 mg total) by mouth daily. 12/22/20 03/24/21 Yes Fritz Cauthon, Ines Bloomer, MD  meloxicam (MOBIC) 15 MG tablet TAKE 1 TABLET (15 MG TOTAL) BY MOUTH DAILY. 09/16/20 09/16/21 Yes Scot Jun, FNP  omeprazole (PRILOSEC) 40 MG capsule TAKE 1 CAPSULE (40 MG TOTAL) BY MOUTH DAILY. 06/09/20 06/09/21 Yes Breylen Agyeman, Ines Bloomer, MD  ondansetron (ZOFRAN ODT) 4 MG disintegrating tablet Take 1 tablet (4 mg total) by mouth every 8 (eight) hours as needed for nausea or vomiting. 10/11/20  Yes Raspet, Erin K, PA-C  rosuvastatin (CRESTOR) 20 MG tablet TAKE 1 TABLET (20 MG TOTAL) BY MOUTH DAILY. 05/12/20 05/12/21 Yes Dayzha Pogosyan, Ines Bloomer, MD   tiZANidine (ZANAFLEX) 2 MG tablet TAKE 1-2 TABLETS (2-4 MG TOTAL) BY MOUTH EVERY 6 (SIX) HOURS AS NEEDED FOR MUSCLE SPASMS. 09/16/20 09/16/21 Yes Scot Jun, FNP    Allergies  Allergen Reactions   Aspirin Hives   Hydromorphone Hcl Hives and Nausea And Vomiting   Latex Rash    Patient Active Problem List   Diagnosis Date Noted   Tension headache 12/22/2020   Low back pain 10/20/2020   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/28/2020   Chronic diarrhea 09/14/2017   H/O TIA (transient ischemic attack) and stroke 06/12/2014   Cervical radiculitis 02/12/2014   Essential hypertension, benign 01/24/2010   Microcytic anemia 01/11/2010   Hyperlipemia 05/28/2007   Current smoker 05/28/2007   GERD 05/28/2007    Past Medical History:  Diagnosis Date   Anemia    Arthritis    "neck, hips" (04/25/2018)   Claustrophobia    Daily headache    GERD (gastroesophageal reflux disease)    Hiatal hernia    Hyperlipidemia    "hx" (04/25/2018)   Hypertension    Plantar fasciitis    hx - left foot   Stroke ([place]) 05/2014   "mini stroke" (04/25/2018)    Past Surgical History:  Procedure Laterality Date   ABDOMINAL HYSTERECTOMY  12/09   Secondary to fibroids   ARTERY BIOPSY Right 04/10/2018   Procedure: BIOPSY TEMPORAL ARTERY;  Surgeon: Rosetta Posner, MD;  Location: [place];  Service: Vascular;  Laterality: Right;  CESAREAN SECTION  1986; 1988   COLONOSCOPY     FOOT SURGERY Left 03/2017   Bone Spurs Removed   RADIOLOGY WITH ANESTHESIA N/A 04/26/2018   Procedure: MRI WITH ANESTHESIA;  Surgeon: Radiologist, Medication, MD;  Location: [place];  Service: Radiology;  Laterality: N/A;   RADIOLOGY WITH ANESTHESIA N/A 08/24/2019   Procedure: MRI WITH ANESTHESIA CERVICAL SPINE;  Surgeon: Radiologist, Medication, MD;  Location: [place];  Service: Radiology;  Laterality: N/A;   RADIOLOGY WITH ANESTHESIA N/A 10/02/2019   Procedure: MRI WITH ANESTHESIA CERVICAL WITHOUT CONTRAST;  Surgeon: Radiologist,  Medication, MD;  Location: [place];  Service: Radiology;  Laterality: N/A;   TUBAL LIGATION  1988    Social History   Socioeconomic History   Marital status: Divorced    Spouse name: Not on file   Number of children: Not on file   Years of education: Not on file   Highest education level: Not on file  Occupational History   Occupation: unemployed    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  Other Topics Concern   Not on file  Social History Narrative   10 th grade education.   Social Determinants of Health   Financial Resource Strain: Not on file  Food Insecurity: Not on file  Transportation Needs: Not on file  Physical Activity: Not on file  Stress: Not on file  Social Connections: Not on file  Intimate Partner Violence: Not on file    Family History  Problem Relation Age of Onset   Cancer Mother 38       pancreatic cancer   Pancreatic cancer Mother    Heart disease Mother    Cancer Father 34       throat cancer   Esophageal cancer Father    Cancer Maternal Aunt 4       breast cancer   Cancer Other        Died  from Colon cancer in 60's   Colon cancer Maternal Uncle    Colon cancer Paternal Uncle    Heart disease Sister    Rectal cancer Neg Hx    Stomach cancer Neg Hx      Review of Systems  Constitutional: Negative.  Negative for chills and fever.  HENT: Negative.  Negative for congestion and sore throat.   Respiratory: Negative.  Negative for cough and shortness of breath.   Cardiovascular:   Negative for chest pain and palpitations.  Gastrointestinal:  Negative for abdominal pain, blood in stool, diarrhea, nausea and vomiting.  Genitourinary:  Negative for dysuria and hematuria.  Musculoskeletal:  Positive for back pain and neck pain.  Skin: Negative.  Negative for rash.  Neurological:  Negative for dizziness, sensory change, focal weakness and headaches.  All other systems reviewed and are negative.   Physical Exam Vitals reviewed.  Constitutional:      Appearance: Normal appearance.  HENT:  Head: Normocephalic.  Eyes:     Extraocular Movements: Extraocular movements intact.     Pupils: Pupils are equal, round, and reactive to light.  Cardiovascular:     Rate and Rhythm: Normal rate.  Pulmonary:     Effort: Pulmonary effort is normal.  Abdominal:     Palpations: Abdomen is soft.     Tenderness: There is no abdominal tenderness.  Musculoskeletal:     Cervical back: Normal range of motion.  Skin:    General: Skin is warm and dry.  Neurological:     General: No focal deficit present.     Mental Status: She is alert and oriented to person, place, and time.  Psychiatric:        Mood and Affect: Mood normal.        Behavior: Behavior normal.     ASSESSMENT & PLAN: Clinically stable.  No red flag signs or symptoms.  Pain management with diclofenac and tramadol as prescribed.  Advised not to take tramadol if going to work.  Must follow-up with orthopedist office already scheduled.  Continue with physical therapy as scheduled.  Mikah was seen today for back pain.  Diagnoses and all orders for this visit:  Foraminal stenosis of cervical region  Cervical radiculitis  Chronic neck pain -     traMADol (ULTRAM) 50 MG tablet; Take 1 tablet (50 mg total) by mouth every 12 (twelve) hours as needed for up to 5 days.  Chronic left-sided low back pain with left-sided sciatica -     traMADol (ULTRAM) 50 MG tablet; Take 1 tablet (50 mg total) by mouth every 12  (twelve) hours as needed for up to 5 days.  Musculoskeletal back pain  Other orders -     diclofenac (VOLTAREN) 75 MG EC tablet; Take 1 tablet (75 mg total) by mouth 2 (two) times daily as needed.  Patient Instructions  Cervical Radiculopathy  Cervical radiculopathy means that a nerve in the neck (a cervical nerve) is pinched or bruised. This can happen because of an injury to the cervical spine (vertebrae) in the neck, or as a normal part of getting older. This can cause pain or loss of feeling (numbness) that runs from your neck all the way down to your arm and fingers. Often, this condition gets better with rest. Treatment may be needed if the conditiondoes not get better. What are the causes? A neck injury. A bulging disk in your spine. Muscle movements that you cannot control (muscle spasms). Tight muscles in your neck due to overuse. Arthritis. Breakdown in the bones and joints of the spine (spondylosis) due to getting older. Bone spurs that form near the nerves in the neck. What are the signs or symptoms? Pain. The pain may: Run from the neck to the arm and hand. Be very bad or irritating. Be worse when you move your neck. Loss of feeling or tingling in your arm or hand. Weakness in your arm or hand, in very bad cases. How is this treated? In many cases, treatment is not needed for this condition. With rest, the condition often gets better over time. If treatment is needed, options may include: Wearing a soft neck collar (cervical collar) for short periods of time, as told by your doctor. Doing exercises (physical therapy) to strengthen your neck muscles. Taking medicines. Having shots (injections) in your spine, in very bad cases. Having surgery. This may be needed if other treatments do not help. The type of surgery that is used depends  on the cause of your condition. Follow these instructions at home: If you have a soft neck collar: Wear it as told by your doctor. Remove  it only as told by your doctor. Ask your doctor if you can remove the collar for cleaning and bathing. If you are allowed to remove the collar for cleaning or bathing: Follow instructions from your doctor about how to remove the collar safely. Clean the collar by wiping it with mild soap and water and drying it completely. Take out any removable pads in the collar every 1-2 days. Wash them by hand with soap and water. Let them air-dry completely before you put them back in the collar. Check your skin under the collar for redness or sores. If you see any, tell your doctor. Managing pain     Take over-the-counter and prescription medicines only as told by your doctor. If told, put ice on the painful area. If you have a soft neck collar, remove it as told by your doctor. Put ice in a plastic bag. Place a towel between your skin and the bag. Leave the ice on for 20 minutes, 2-3 times a day. If using ice does not help, you can try using heat. Use the heat source that your doctor recommends, such as a moist heat pack or a heating pad. Place a towel between your skin and the heat source. Leave the heat on for 20-30 minutes. Remove the heat if your skin turns bright red. This is very important if you are unable to feel pain, heat, or cold. You may have a greater risk of getting burned. You may try a gentle neck and shoulder rub (massage). Activity Rest as needed. Return to your normal activities as told by your doctor. Ask your doctor what activities are safe for you. Do exercises as told by your doctor or physical therapist. Do not lift anything that is heavier than 10 lb (4.5 kg) until your doctor tells you that it is safe. General instructions Use a flat pillow when you sleep. Do not drive while wearing a soft neck collar. If you do not have a soft neck collar, ask your doctor if it is safe to drive while your neck heals. Ask your doctor if the medicine prescribed to you requires you to avoid  driving or using heavy machinery. Do not use any products that contain nicotine or tobacco, such as cigarettes, e-cigarettes, and chewing tobacco. These can delay healing. If you need help quitting, ask your doctor. Keep all follow-up visits as told by your doctor. This is important. Contact a doctor if: Your condition does not get better with treatment. Get help right away if: Your pain gets worse and is not helped with medicine. You lose feeling or feel weak in your hand, arm, face, or leg. You have a high fever. You have a stiff neck. You cannot control when you poop or pee (have incontinence). You have trouble with walking, balance, or talking. Summary Cervical radiculopathy means that a nerve in the neck is pinched or bruised. A nerve can get pinched from a bulging disk, arthritis, an injury to the neck, or other causes. Symptoms include pain, tingling, or loss of feeling that goes from the neck into the arm or hand. Weakness in your arm or hand can happen in very bad cases. Treatment may include resting, wearing a soft neck collar, and doing exercises. You might need to take medicines for pain. In very bad cases, shots or surgery may be  needed. This information is not intended to replace advice given to you by your health care provider. Make sure you discuss any questions you have with your healthcare provider. Document Revised: 05/03/2018 Document Reviewed: 05/03/2018 Elsevier Patient Education  2022 [place], [place] [facility]

## 2021-02-02 NOTE — Patient Instructions (Signed)
Cervical Radiculopathy  Cervical radiculopathy means that a nerve in the neck (a cervical nerve) is pinched or bruised. This can happen because of an injury to the cervical spine (vertebrae) in the neck, or as a normal part of getting older. This can cause pain or loss of feeling (numbness) that runs from your neck all the way down to your arm and fingers. Often, this condition gets better with rest. Treatment may be needed if the conditiondoes not get better. What are the causes? A neck injury. A bulging disk in your spine. Muscle movements that you cannot control (muscle spasms). Tight muscles in your neck due to overuse. Arthritis. Breakdown in the bones and joints of the spine (spondylosis) due to getting older. Bone spurs that form near the nerves in the neck. What are the signs or symptoms? Pain. The pain may: Run from the neck to the arm and hand. Be very bad or irritating. Be worse when you move your neck. Loss of feeling or tingling in your arm or hand. Weakness in your arm or hand, in very bad cases. How is this treated? In many cases, treatment is not needed for this condition. With rest, the condition often gets better over time. If treatment is needed, options may include: Wearing a soft neck collar (cervical collar) for short periods of time, as told by your doctor. Doing exercises (physical therapy) to strengthen your neck muscles. Taking medicines. Having shots (injections) in your spine, in very bad cases. Having surgery. This may be needed if other treatments do not help. The type of surgery that is used depends on the cause of your condition. Follow these instructions at home: If you have a soft neck collar: Wear it as told by your doctor. Remove it only as told by your doctor. Ask your doctor if you can remove the collar for cleaning and bathing. If you are allowed to remove the collar for cleaning or bathing: Follow instructions from your doctor about how to remove  the collar safely. Clean the collar by wiping it with mild soap and water and drying it completely. Take out any removable pads in the collar every 1-2 days. Wash them by hand with soap and water. Let them air-dry completely before you put them back in the collar. Check your skin under the collar for redness or sores. If you see any, tell your doctor. Managing pain     Take over-the-counter and prescription medicines only as told by your doctor. If told, put ice on the painful area. If you have a soft neck collar, remove it as told by your doctor. Put ice in a plastic bag. Place a towel between your skin and the bag. Leave the ice on for 20 minutes, 2-3 times a day. If using ice does not help, you can try using heat. Use the heat source that your doctor recommends, such as a moist heat pack or a heating pad. Place a towel between your skin and the heat source. Leave the heat on for 20-30 minutes. Remove the heat if your skin turns bright red. This is very important if you are unable to feel pain, heat, or cold. You may have a greater risk of getting burned. You may try a gentle neck and shoulder rub (massage). Activity Rest as needed. Return to your normal activities as told by your doctor. Ask your doctor what activities are safe for you. Do exercises as told by your doctor or physical therapist. Do not lift anything that   is heavier than 10 lb (4.5 kg) until your doctor tells you that it is safe. General instructions Use a flat pillow when you sleep. Do not drive while wearing a soft neck collar. If you do not have a soft neck collar, ask your doctor if it is safe to drive while your neck heals. Ask your doctor if the medicine prescribed to you requires you to avoid driving or using heavy machinery. Do not use any products that contain nicotine or tobacco, such as cigarettes, e-cigarettes, and chewing tobacco. These can delay healing. If you need help quitting, ask your doctor. Keep all  follow-up visits as told by your doctor. This is important. Contact a doctor if: Your condition does not get better with treatment. Get help right away if: Your pain gets worse and is not helped with medicine. You lose feeling or feel weak in your hand, arm, face, or leg. You have a high fever. You have a stiff neck. You cannot control when you poop or pee (have incontinence). You have trouble with walking, balance, or talking. Summary Cervical radiculopathy means that a nerve in the neck is pinched or bruised. A nerve can get pinched from a bulging disk, arthritis, an injury to the neck, or other causes. Symptoms include pain, tingling, or loss of feeling that goes from the neck into the arm or hand. Weakness in your arm or hand can happen in very bad cases. Treatment may include resting, wearing a soft neck collar, and doing exercises. You might need to take medicines for pain. In very bad cases, shots or surgery may be needed. This information is not intended to replace advice given to you by your health care provider. Make sure you discuss any questions you have with your healthcare provider. Document Revised: 05/03/2018 Document Reviewed: 05/03/2018 Elsevier Patient Education  2022 [employer]  

## 2021-02-03 ENCOUNTER — Other Ambulatory Visit (HOSPITAL_COMMUNITY): Payer: Self-pay

## 2021-02-03 MED FILL — Amlodipine Besylate Tab 5 MG (Base Equivalent): ORAL | 90 days supply | Qty: 90 | Fill #1 | Status: AC

## 2021-02-03 MED FILL — Pancrelipase (Lip-Prot-Amyl) DR Cap 36000-114000-180000 Unit: ORAL | 30 days supply | Qty: 330 | Fill #0 | Status: AC

## 2021-02-04 ENCOUNTER — Other Ambulatory Visit (HOSPITAL_COMMUNITY): Payer: Self-pay

## 2021-02-17 ENCOUNTER — Encounter: Payer: Self-pay | Admitting: Surgery

## 2021-02-17 ENCOUNTER — Other Ambulatory Visit: Payer: Self-pay

## 2021-02-17 ENCOUNTER — Other Ambulatory Visit (HOSPITAL_COMMUNITY): Payer: Self-pay

## 2021-02-17 ENCOUNTER — Ambulatory Visit (INDEPENDENT_AMBULATORY_CARE_PROVIDER_SITE_OTHER): Payer: 59 | Admitting: Surgery

## 2021-02-17 ENCOUNTER — Telehealth: Payer: Self-pay | Admitting: Emergency Medicine

## 2021-02-17 VITALS — BP 122/83 | HR 77

## 2021-02-17 DIAGNOSIS — R202 Paresthesia of skin: Secondary | ICD-10-CM

## 2021-02-17 DIAGNOSIS — M5416 Radiculopathy, lumbar region: Secondary | ICD-10-CM | POA: Diagnosis not present

## 2021-02-17 DIAGNOSIS — R200 Anesthesia of skin: Secondary | ICD-10-CM

## 2021-02-17 DIAGNOSIS — M5412 Radiculopathy, cervical region: Secondary | ICD-10-CM | POA: Diagnosis not present

## 2021-02-17 NOTE — Telephone Encounter (Signed)
Patient says she has to go have MRI done soon  Says she has severe anxiety & has to be sedated when doing these type of procedures.  Wants to know if provider can prescribed something for her prior to MRI  Please follow up w/ patient [phone]

## 2021-02-17 NOTE — Progress Notes (Signed)
Office Visit Note   Patient: [name]           Date of Birth: February 25, 1961           MRN: [id number] Visit Date: 02/17/2021              Requested by: Horald Pollen, MD [place],  [place] [address] PCP: Horald Pollen, MD   Assessment & Plan: Visit Diagnoses:  1. Radiculopathy, cervical region   2. Numbness and tingling in right hand   3. Radiculopathy, lumbar region     Plan: Since patient has not had any improvement with conservative management I will go ahead and schedule cervical and lumbar MRI scans to rule out HNP/gnosis.  Patient will follow-up with Dr. Lorin Mercy in 3 weeks to discuss results and further treatment options.  Follow-Up Instructions: Return in about 3 weeks (around 03/10/2021) for with Dr. Lorin Mercy to review cervical and lumbar MRI scans.   Orders:  Orders Placed This Encounter  Procedures   MR Cervical Spine w/o contrast   MR Lumbar Spine w/o contrast   No orders of the defined types were placed in this encounter.     Procedures: No procedures performed   Clinical Data: No additional findings.   Subjective: Chief Complaint  Patient presents with   Lower Back - Follow-up    HPI 60 year old black female returns with complaints of neck pain with upper extremity radiculopathy and low back pain with lower extremity radiculopathy.  Patient was last seen by Dr. Lorin Mercy April 2022 and was prescribed prednisone taper.  States that she had temporary improvement with this.  She was recently seen by her primary care provider for ongoing symptoms.  She was referred back to our office.  States that she has neck pain with radiation to her arms.  Numbness and tingling in both hands.  She also has chronic low back pain with radiation down to her hips.  Feeling of numbness and tingling in the bottom of her right foot.  Symptoms are fairly constant. Review of Systems No current cardiac pulmonary GI GU issues  Objective: Vital Signs: BP  122/83 (BP Location: Left Arm, Patient Position: Sitting, Cuff Size: Normal)   Pulse 77   LMP  (LMP Unknown)   SpO2 97%   Physical Exam HENT:     Head: Normocephalic.  Eyes:     Extraocular Movements: Extraocular movements intact.  Abdominal:     General: There is no distension.  Musculoskeletal:     Comments: Gait is normal.  Cervical spine patient has bilateral brachial plexus and trapezius tenderness.  Good cervical spine range of motion.  Bilateral shoulders unremarkable.  Bilateral lumbar paraspinal tenderness.  Some tenderness over the bilateral SI joints.  Negative logroll.  Positive right straight leg raise.  No focal motor deficits upper or lower extremities.  Neurological:     General: No focal deficit present.     Mental Status: She is alert and oriented to person, place, and time.  Psychiatric:        Mood and Affect: Mood normal.    Ortho Exam  Specialty Comments:  No specialty comments available.  Imaging: No results found.   PMFS History: Patient Active Problem List   Diagnosis Date Noted   Tension headache 12/22/2020   Low back pain 10/20/2020   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/28/2020   Chronic diarrhea 09/14/2017   H/O TIA (transient ischemic attack) and stroke 06/12/2014   Cervical radiculitis  02/12/2014   Essential hypertension, benign 01/24/2010   Microcytic anemia 01/11/2010   Hyperlipemia 05/28/2007   Current smoker 05/28/2007   GERD 05/28/2007   Past Medical History:  Diagnosis Date   Anemia    Arthritis    "neck, hips" (04/25/2018)   Claustrophobia    Daily headache    GERD (gastroesophageal reflux disease)    Hiatal hernia    Hyperlipidemia    "hx" (04/25/2018)   Hypertension    Plantar fasciitis    hx - left foot   Stroke ([place]) 05/2014   "mini stroke" (04/25/2018)    Family History  Problem Relation Age of Onset   Cancer Mother 73       pancreatic cancer   Pancreatic cancer Mother    Heart disease Mother    Cancer  Father 31       throat cancer   Esophageal cancer Father    Cancer Maternal Aunt 60       breast cancer   Cancer Other        Died  from Colon cancer in 60's   Colon cancer Maternal Uncle    Colon cancer Paternal Uncle    Heart disease Sister    Rectal cancer Neg Hx    Stomach cancer Neg Hx     Past Surgical History:  Procedure Laterality Date   ABDOMINAL HYSTERECTOMY  12/09   Secondary to fibroids   ARTERY BIOPSY Right 04/10/2018   Procedure: BIOPSY TEMPORAL ARTERY;  Surgeon: Rosetta Posner, MD;  Location: [facility];  Service: Vascular;  Laterality: Right;   [place]; 1988   COLONOSCOPY     FOOT SURGERY Left 03/2017   Bone Spurs Removed   RADIOLOGY WITH ANESTHESIA N/A 04/26/2018   Procedure: MRI WITH ANESTHESIA;  Surgeon: Radiologist, Medication, MD;  Location: [facility];  Service: Radiology;  Laterality: N/A;   RADIOLOGY WITH ANESTHESIA N/A 08/24/2019   Procedure: MRI WITH ANESTHESIA CERVICAL SPINE;  Surgeon: Radiologist, Medication, MD;  Location: [facility];  Service: Radiology;  Laterality: N/A;   RADIOLOGY WITH ANESTHESIA N/A 10/02/2019   Procedure: MRI WITH ANESTHESIA CERVICAL WITHOUT CONTRAST;  Surgeon: Radiologist, Medication, MD;  Location: [facility];  Service: Radiology;  Laterality: N/A;   TUBAL LIGATION  1988   Social History   Occupational History   Occupation: unemployed    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

## 2021-02-19 ENCOUNTER — Other Ambulatory Visit: Payer: Self-pay | Admitting: Emergency Medicine

## 2021-02-19 MED ORDER — ALPRAZOLAM 0.5 MG PO TABS
ORAL_TABLET | ORAL | 0 refills | Status: DC
  Filled 2021-02-19: qty 10, 5d supply, fill #0

## 2021-02-19 NOTE — Telephone Encounter (Signed)
No problem.  Prescription for Xanax sent to pharmacy of record.  Thanks.

## 2021-02-21 ENCOUNTER — Other Ambulatory Visit (HOSPITAL_COMMUNITY): Payer: Self-pay

## 2021-02-21 NOTE — Telephone Encounter (Signed)
Called and left VM with Dr. Barry Brunner recommendations.

## 2021-02-21 NOTE — Addendum Note (Signed)
Addended by: Obie Dredge A on: 02/21/2021 03:23 PM   Modules accepted: Orders

## 2021-02-24 ENCOUNTER — Telehealth: Payer: Self-pay | Admitting: Orthopaedic Surgery

## 2021-02-24 ENCOUNTER — Other Ambulatory Visit: Payer: Self-pay | Admitting: Internal Medicine

## 2021-02-24 ENCOUNTER — Other Ambulatory Visit (HOSPITAL_COMMUNITY): Payer: Self-pay

## 2021-02-24 NOTE — Telephone Encounter (Signed)
Pt called stating the inflammation in her neck and back is causing pain that feels unbearable. Pt states she had previously gotten a gel that she could rub on the problem areas that really helped. Pt would like a CB when Dr.Yates has called something in please.  [phone]

## 2021-02-25 ENCOUNTER — Telehealth: Payer: Self-pay

## 2021-02-25 ENCOUNTER — Other Ambulatory Visit (HOSPITAL_COMMUNITY): Payer: Self-pay

## 2021-02-25 NOTE — Telephone Encounter (Signed)
Please advise 

## 2021-02-25 NOTE — Telephone Encounter (Signed)
Please advise as the pt has stated she would like a rx refill for and to be put back on the diclofenac sodium (VOLTAREN) 1 % GEL.

## 2021-03-01 ENCOUNTER — Other Ambulatory Visit: Payer: Self-pay | Admitting: Emergency Medicine

## 2021-03-01 ENCOUNTER — Other Ambulatory Visit (HOSPITAL_COMMUNITY): Payer: Self-pay

## 2021-03-01 MED ORDER — DICLOFENAC SODIUM 3 % EX GEL
1.0000 "application " | Freq: Two times a day (BID) | CUTANEOUS | 1 refills | Status: DC | PRN
  Filled 2021-03-01: qty 100, 30d supply, fill #0

## 2021-03-01 NOTE — Telephone Encounter (Signed)
It is okay to take both.  She can also take Tylenol along with diclofenac and this is safe.  New prescription for diclofenac gel sent to pharmacy of record.  Thanks.

## 2021-03-01 NOTE — Telephone Encounter (Signed)
Called and left vm with instructions.

## 2021-03-01 NOTE — Telephone Encounter (Signed)
Called and spoke with pt. She states that she would like a new prescription for dicofenac sodium (voltaren) gel, so she can continue to work and do daily activities. She is currently takes dicofenac PO and states she is unable to work taking medication. Patient would like to know if both can be used? Patient has a MRI 03/10/21 and states she would like something for pain until then.

## 2021-03-10 ENCOUNTER — Other Ambulatory Visit: Payer: Self-pay | Admitting: Emergency Medicine

## 2021-03-10 ENCOUNTER — Ambulatory Visit (HOSPITAL_COMMUNITY)
Admission: RE | Admit: 2021-03-10 | Discharge: 2021-03-10 | Disposition: A | Discharge status: Home / Self Care | Payer: 59 | Source: Ambulatory Visit | Attending: Surgery | Admitting: Surgery

## 2021-03-10 ENCOUNTER — Other Ambulatory Visit: Payer: Self-pay

## 2021-03-10 ENCOUNTER — Telehealth: Payer: Self-pay | Admitting: Emergency Medicine

## 2021-03-10 ENCOUNTER — Other Ambulatory Visit (HOSPITAL_COMMUNITY): Payer: Self-pay

## 2021-03-10 DIAGNOSIS — M5416 Radiculopathy, lumbar region: Secondary | ICD-10-CM

## 2021-03-10 DIAGNOSIS — M5412 Radiculopathy, cervical region: Secondary | ICD-10-CM | POA: Insufficient documentation

## 2021-03-10 DIAGNOSIS — R202 Paresthesia of skin: Secondary | ICD-10-CM | POA: Diagnosis present

## 2021-03-10 DIAGNOSIS — R200 Anesthesia of skin: Secondary | ICD-10-CM | POA: Diagnosis present

## 2021-03-10 MED ORDER — ALPRAZOLAM 0.5 MG PO TABS
ORAL_TABLET | ORAL | 0 refills | Status: DC
  Filled 2021-03-10: qty 2, 1d supply, fill #0

## 2021-03-10 NOTE — Telephone Encounter (Signed)
Called and left vm for pt

## 2021-03-10 NOTE — Telephone Encounter (Signed)
Patietn calling bc she has MRI procedure scheduled for this afternoon at 4  Provider prescribed patient ALPRAZolam Duanne Moron) 0.5 MG tablet  Patient says she has misplaced the rx & wants to know if provider is willing to submit another rx to pharmacy for her  Patient says she is extremely claustrophobic & can not do the MRI w/ out the meds to calm her  Pharmacy: [facility]  Phone:  [phone] Fax:  [phone]

## 2021-03-10 NOTE — Telephone Encounter (Signed)
Prescription for 2 pills sent to pharmacy.

## 2021-03-11 ENCOUNTER — Other Ambulatory Visit (HOSPITAL_COMMUNITY): Payer: Self-pay

## 2021-03-11 ENCOUNTER — Other Ambulatory Visit: Payer: Self-pay | Admitting: Emergency Medicine

## 2021-03-11 NOTE — Telephone Encounter (Signed)
Message left for patient to return call to clinic. If she calls back okay to give her Dr. Quay Burow recommendations.

## 2021-03-11 NOTE — Telephone Encounter (Signed)
Patient took Xanax yesterday around 4 or 4:30 for MRI procedure  Patient says she is still feeling sleepy & drowsy this morning  Wants to know when she can expect symptoms to wear off or what she can do

## 2021-03-11 NOTE — Telephone Encounter (Signed)
I would expect to improve over the next several hours - everyone is a little different - it may take the rest of the day for you to feel normal.  If you get an MRI in the future you probably need a lower dose

## 2021-03-14 ENCOUNTER — Other Ambulatory Visit (HOSPITAL_COMMUNITY): Payer: Self-pay

## 2021-03-14 MED FILL — Omeprazole Cap Delayed Release 40 MG: ORAL | 90 days supply | Qty: 90 | Fill #1 | Status: AC

## 2021-03-15 ENCOUNTER — Other Ambulatory Visit: Payer: Self-pay | Admitting: Orthopaedic Surgery

## 2021-03-15 ENCOUNTER — Telehealth: Payer: Self-pay | Admitting: Orthopaedic Surgery

## 2021-03-15 ENCOUNTER — Other Ambulatory Visit (HOSPITAL_COMMUNITY): Payer: Self-pay

## 2021-03-15 MED ORDER — PREDNISONE 10 MG (21) PO TBPK
ORAL_TABLET | ORAL | 0 refills | Status: DC
  Filled 2021-03-15: qty 21, 6d supply, fill #0

## 2021-03-15 NOTE — Telephone Encounter (Signed)
Pt called stating her pain levels are over a ten and is affecting her work. Pt is waiting for her MRI review appt set for 03/23/21; and would like to know if she can have something called in? Pt would like a CB to update her if this will be possible.   [phone]

## 2021-03-15 NOTE — Telephone Encounter (Signed)
Sent to pharmacy 

## 2021-03-15 NOTE — Telephone Encounter (Signed)
Please advise 

## 2021-03-16 ENCOUNTER — Other Ambulatory Visit (HOSPITAL_COMMUNITY): Payer: Self-pay

## 2021-03-18 ENCOUNTER — Ambulatory Visit (INDEPENDENT_AMBULATORY_CARE_PROVIDER_SITE_OTHER): Payer: 59

## 2021-03-18 ENCOUNTER — Other Ambulatory Visit: Payer: Self-pay

## 2021-03-18 DIAGNOSIS — Z23 Encounter for immunization: Secondary | ICD-10-CM | POA: Diagnosis not present

## 2021-03-18 NOTE — Progress Notes (Signed)
Pt was given Shingles #2 and Reg Flu Vacc w/o any complications.

## 2021-03-23 ENCOUNTER — Encounter: Payer: Self-pay | Admitting: Orthopaedic Surgery

## 2021-03-23 ENCOUNTER — Ambulatory Visit (INDEPENDENT_AMBULATORY_CARE_PROVIDER_SITE_OTHER): Payer: 59 | Admitting: Orthopaedic Surgery

## 2021-03-23 ENCOUNTER — Other Ambulatory Visit: Payer: Self-pay

## 2021-03-23 ENCOUNTER — Telehealth: Payer: Self-pay | Admitting: Radiology

## 2021-03-23 DIAGNOSIS — M48062 Spinal stenosis, lumbar region with neurogenic claudication: Secondary | ICD-10-CM

## 2021-03-23 DIAGNOSIS — M48061 Spinal stenosis, lumbar region without neurogenic claudication: Secondary | ICD-10-CM | POA: Insufficient documentation

## 2021-03-23 NOTE — Telephone Encounter (Signed)
Patient requests something for pain be sent to [facility].  Please advise.

## 2021-03-23 NOTE — Progress Notes (Signed)
Office Visit Note   Patient: [name]           Date of Birth: October 04, 1960           MRN: [id number] Visit Date: 03/23/2021              Requested by: Horald Pollen, [place],  [place] [address] PCP: Horald Pollen, MD   Assessment & Plan: Visit Diagnoses:  1. Spinal stenosis of lumbar region with neurogenic claudication     Plan: We reviewed MRI scan with her and I gave her a copy of the report.  We discussed pathophysiology.  She has had progressive symptoms which is making it difficult for her to work.  She does not have instability and is having claudication symptoms.  Plan would be central decompression at L4-5  and L5-S1.  Overnight stay and expected out of work times somewhere in the 4 to 6-week range.  Questions were elicited and answered we discussed risks of dural tear possible progression of disc degeneration and potential for restenosis over many years.  Possible need for later lumbar fusion if she developed instability.  Patient understands and requests we proceed with two-level lumbar decompression at L4-5 and L5-S1.  Follow-Up Instructions: No follow-ups on file.   Orders:  No orders of the defined types were placed in this encounter.  No orders of the defined types were placed in this encounter.     Procedures: No procedures performed   Clinical Data: No additional findings.   Subjective: Chief Complaint  Patient presents with   Neck - Follow-up, Pain    MRI cervical spine review   Lower Back - Follow-up, Pain    MRI lumbar spine review    HPI 60 year old female returns with progressive problems with back pain leg weakness problems with standing and walking.  Patient states she does a lot of walking at work it is gradually getting worse and she is noted she has to stop and sit more frequently to get relief and then can stand up and repeat.  She gets relief leaning on a grocery cart when she goes to the store.  No  associated bowel or bladder symptoms.  She does have problems with neck discomfort but primarily lower extremity pain and weakness with standing or trying to walk more than a block.  MRI scan has been obtained which shows multifactorial moderate spinal stenosis at L4-5 and L5-S1.  Patient is concerned that its getting harder and harder for her to do her job since she has increased pain with walking.  Patient's had problems with hypertension hyperlipidemia she is a smoker.  History of TIA in the past.  Normal temporal artery biopsy.  MRI brain 04/26/2018 showed no acute intracranial abnormalities with mild nasal cavity mucosal thickening.  Review of Systems all other systems are noncontributory to HPI.   Objective: Vital Signs: BP (!) 154/89   Pulse 85   Ht 4\' 9"  (1.448 m)   Wt 135 lb (61.2 kg)   LMP  (LMP Unknown)   BMI 29.21 kg/m   Physical Exam Constitutional:      Appearance: She is well-developed.  HENT:     Head: Normocephalic.     Right Ear: External ear normal.     Left Ear: External ear normal. There is no impacted cerumen.  Eyes:     Pupils: Pupils are equal, round, and reactive to light.  Neck:     Thyroid: No thyromegaly.  Trachea: No tracheal deviation.  Cardiovascular:     Rate and Rhythm: Normal rate.  Pulmonary:     Effort: Pulmonary effort is normal.  Abdominal:     Palpations: Abdomen is soft.  Musculoskeletal:     Cervical back: No rigidity.  Skin:    General: Skin is warm and dry.  Neurological:     Mental Status: She is alert and oriented to person, place, and time.  Psychiatric:        Behavior: Behavior normal.    Ortho Exam patient is able to heel and toe walk anterior tib gastrocsoleus is strong distal pulses are 2+ and symmetrical negative straight leg raising 90 degrees negative logroll hips mild sciatic notch tenderness right and left.  No midline defects in the lumbar spine.  Specialty Comments:  No specialty comments  available.  Imaging: Narrative & Impression  CLINICAL DATA:  Cervical radiculopathy. Numbness and tingling right hand.   EXAM: MRI CERVICAL SPINE WITHOUT CONTRAST   TECHNIQUE: Multiplanar, multisequence MR imaging of the cervical spine was performed. No intravenous contrast was administered.   COMPARISON:  MRI cervical spine 10/02/2019   FINDINGS: Alignment: Normal alignment.  Straightening of the cervical lordosis   Motion degraded study due to claustrophobia.   Vertebrae: Negative for fracture or mass   Cord: Cord evaluation limited by motion. No cord signal abnormality identified.   Posterior Fossa, vertebral arteries, paraspinal tissues: Negative   Disc levels:   C2-3: Negative   C3-4: Mild disc degeneration and spurring.  Negative for stenosis   C4-5: Mild disc degeneration and spurring.  Negative for stenosis   C5-6: Negative   C6-7: Disc degeneration with shallow broad-based disc protrusion and mild spurring. Mild foraminal narrowing bilaterally   C7-T1: Mild disc degeneration.  No significant stenosis.   IMPRESSION: Motion degraded study   Mild cervical spine degenerative change. Mild foraminal narrowing bilaterally at C6-7.     Electronically Signed   By: Franchot Gallo M.D.   On: 03/11/2021 16:39  CLINICAL DATA:  Low back pain, > 6 wks Lumbar radiculopathy, prior surgery, new symptoms   EXAM: MRI LUMBAR SPINE WITHOUT CONTRAST   TECHNIQUE: Multiplanar, multisequence MR imaging of the lumbar spine was performed. No intravenous contrast was administered.   COMPARISON:  Lumbar radiographs 03/16/2020 without report.   FINDINGS: Segmentation: Standard segmentation is assumed. The inferior-most fully formed intervertebral disc is labeled L5-S1.   Alignment: Mild dextrocurvature of the lower lumbar spine. No substantial sagittal subluxation.   Vertebrae: Mild discogenic/degenerative endplate signal changes about the L5-S1 disc. Otherwise, no  focal marrow edema to suggest acute fracture or discitis/osteomyelitis. Heterogeneous bone marrow without suspicious bone lesion.   Conus medullaris and cauda equina: Conus extends to the superior L3 level. Conus and cauda equina appear normal.   Paraspinal and other soft tissues: Unremarkable.   Disc levels:   T12-L1: No significant disc protrusion, foraminal stenosis, or canal stenosis.   L1-L2: No significant disc protrusion, foraminal stenosis, or canal stenosis.   L2-L3: Mild disc bulging and mild facet arthropathy without significant canal or foraminal stenosis.   L3-L4: Mild disc bulging and mild bilateral facet arthropathy with mild right foraminal stenosis. Mild left subarticular recess stenosis without significant canal stenosis.   L4-L5: Broad disc bulge with ligamentum flavum thickening and moderate bilateral facet hypertrophy. Mildly prominent dorsal epidural fat. Resulting moderate canal stenosis and moderate right and mild left foraminal stenosis.   L5-S1: Broad disc bulge and moderate bilateral facet hypertrophy. Resulting moderate bilateral  foraminal stenosis and moderate canal stenosis   IMPRESSION: 1. Moderate canal stenosis at L4-L5 and L5-S1. Moderate foraminal stenosis on the right at L4-L5 and bilaterally at L5-S1. 2. Mildly low-lying conus, extending to the superior L3 level.     Electronically Signed   By: Margaretha Sheffield M.D.   On: 03/11/2021 16:22   PMFS History: Patient Active Problem List   Diagnosis Date Noted   Spinal stenosis of lumbar region 03/23/2021   Tension headache 12/22/2020   Low back pain 10/20/2020   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/28/2020   Chronic diarrhea 09/14/2017   H/O TIA (transient ischemic attack) and stroke 06/12/2014   Cervical radiculitis 02/12/2014   Essential hypertension, benign 01/24/2010   Microcytic anemia 01/11/2010   Hyperlipemia 05/28/2007   Current smoker 05/28/2007   GERD 05/28/2007    Past Medical History:  Diagnosis Date   Anemia    Arthritis    "neck, hips" (04/25/2018)   Claustrophobia    Daily headache    GERD (gastroesophageal reflux disease)    Hiatal hernia    Hyperlipidemia    "hx" (04/25/2018)   Hypertension    Plantar fasciitis    hx - left foot   Stroke ([place]) 05/2014   "mini stroke" (04/25/2018)    Family History  Problem Relation Age of Onset   Cancer Mother 41       pancreatic cancer   Pancreatic cancer Mother    Heart disease Mother    Cancer Father 93       throat cancer   Esophageal cancer Father    Cancer Maternal Aunt 60       breast cancer   Cancer Other        Died  from Colon cancer in 60's   Colon cancer Maternal Uncle    Colon cancer Paternal Uncle    Heart disease Sister    Rectal cancer Neg Hx    Stomach cancer Neg Hx     Past Surgical History:  Procedure Laterality Date   ABDOMINAL HYSTERECTOMY  12/09   Secondary to fibroids   ARTERY BIOPSY Right 04/10/2018   Procedure: BIOPSY TEMPORAL ARTERY;  Surgeon: Rosetta Posner, MD;  Location: [facility];  Service: Vascular;  Laterality: Right;   [place]; 1988   COLONOSCOPY     FOOT SURGERY Left 03/2017   Bone Spurs Removed   RADIOLOGY WITH ANESTHESIA N/A 04/26/2018   Procedure: MRI WITH ANESTHESIA;  Surgeon: Radiologist, Medication, MD;  Location: [facility];  Service: Radiology;  Laterality: N/A;   RADIOLOGY WITH ANESTHESIA N/A 08/24/2019   Procedure: MRI WITH ANESTHESIA CERVICAL SPINE;  Surgeon: Radiologist, Medication, MD;  Location: [facility];  Service: Radiology;  Laterality: N/A;   RADIOLOGY WITH ANESTHESIA N/A 10/02/2019   Procedure: MRI WITH ANESTHESIA CERVICAL WITHOUT CONTRAST;  Surgeon: Radiologist, Medication, MD;  Location: [facility];  Service: Radiology;  Laterality: N/A;   TUBAL LIGATION  1988   Social History   Occupational History   Occupation: unemployed    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

## 2021-03-23 NOTE — Telephone Encounter (Signed)
noted 

## 2021-03-30 ENCOUNTER — Telehealth: Payer: Self-pay

## 2021-03-30 ENCOUNTER — Other Ambulatory Visit (HOSPITAL_COMMUNITY): Payer: Self-pay

## 2021-03-30 NOTE — Telephone Encounter (Signed)
Pt called stating that she is in extreme pain and is there anything else she can do or take to help ?

## 2021-03-30 NOTE — Telephone Encounter (Signed)
Please advise 

## 2021-03-30 NOTE — Telephone Encounter (Signed)
noted 

## 2021-04-01 ENCOUNTER — Other Ambulatory Visit: Payer: Self-pay

## 2021-04-05 ENCOUNTER — Other Ambulatory Visit (HOSPITAL_COMMUNITY): Payer: Self-pay

## 2021-04-05 MED FILL — Bupropion HCl Tab ER 12HR 150 MG: ORAL | 90 days supply | Qty: 90 | Fill #1 | Status: AC

## 2021-04-05 MED FILL — Pancrelipase (Lip-Prot-Amyl) DR Cap 36000-114000-180000 Unit: ORAL | 30 days supply | Qty: 330 | Fill #1 | Status: AC

## 2021-04-05 MED FILL — Rosuvastatin Calcium Tab 20 MG: ORAL | 90 days supply | Qty: 90 | Fill #1 | Status: AC

## 2021-04-06 NOTE — Progress Notes (Addendum)
Surgical Instructions    Your procedure is scheduled on Wednesday, October 19th.  Report to [facility] Main Entrance "A" at 10:30 A.M., then check in with the Admitting office.  Call this number if you have problems the morning of surgery:  [phone]   If you have any questions prior to your surgery date call [phone]: Open Monday-Friday 8am-4pm    Remember:  Do not eat after midnight the night before your surgery  You may drink clear liquids until 9:30 AM the morning of your surgery.   Clear liquids allowed are: Water, Non-Citrus Juices (without pulp), Carbonated Beverages, Clear Tea, Black Coffee ONLY (NO MILK, CREAM OR POWDERED CREAMER of any kind), and Gatorade    Take these medicines the morning of surgery with A SIP OF WATER Amlodipine (Norvasc) Omeprazole (Prilosec) Rosuvastatin (Crestor)    As of today, STOP taking any Aspirin (unless otherwise instructed by your surgeon) Aleve, Naproxen, Ibuprofen, Motrin, Advil, Goody's, BC's, all herbal medications, fish oil, and all vitamins.               DAY OF SURGERY: Do not wear jewelry, makeup, or nail polish Do not wear lotions, powders, perfumes, or deodorant. Do not shave 48 hours prior to surgery.   Do not bring valuables to the hospital.             [facility] is not responsible for any belongings or valuables.  Do NOT Smoke (Tobacco/Vaping)  24 hours prior to your procedure  If you use a CPAP at night, you may bring your mask for your overnight stay.   Contacts, glasses, hearing aids, dentures or partials may not be worn into surgery, please bring cases for these belongings   For patients admitted to the hospital, discharge time will be determined by your treatment team.   Patients discharged the day of surgery will not be allowed to drive home, and someone needs to stay with them for 24 hours.  NO VISITORS WILL BE ALLOWED IN PRE-OP WHERE PATIENTS ARE PREPPED FOR SURGERY.  ONLY 1 SUPPORT PERSON MAY BE  PRESENT IN THE WAITING ROOM WHILE YOU ARE IN SURGERY.  IF YOU ARE TO BE ADMITTED, ONCE YOU ARE IN YOUR ROOM YOU WILL BE ALLOWED TWO (2) VISITORS. 1 (ONE) VISITOR MAY STAY OVERNIGHT BUT MUST ARRIVE TO THE ROOM BY 8pm.  Minor children may have two parents present. Special consideration for safety and communication needs will be reviewed on a case by case basis.  Special instructions:    Oral Hygiene is also important to reduce your risk of infection.  Remember - BRUSH YOUR TEETH THE MORNING OF SURGERY WITH YOUR REGULAR TOOTHPASTE   [facility]- Preparing For Surgery  Before surgery, you can play an important role. Because skin is not sterile, your skin needs to be as free of germs as possible. You can reduce the number of germs on your skin by washing with CHG (chlorahexidine gluconate) Soap before surgery.  CHG is an antiseptic cleaner which kills germs and bonds with the skin to continue killing germs even after washing.     Please do not use if you have an allergy to CHG or antibacterial soaps. If your skin becomes reddened/irritated stop using the CHG.  Do not shave (including legs and underarms) for at least 48 hours prior to first CHG shower. It is OK to shave your face.  Please follow these instructions carefully.     Shower the NIGHT BEFORE SURGERY and the MORNING OF  SURGERY with CHG Soap.   If you chose to wash your hair, wash your hair first as usual with your normal shampoo. After you shampoo, rinse your hair and body thoroughly to remove the shampoo.  Then [employer] and genitals (private parts) with your normal soap and rinse thoroughly to remove soap.  After that Use CHG Soap as you would any other liquid soap. You can apply CHG directly to the skin and wash gently with a scrungie or a clean washcloth.   Apply the CHG Soap to your body ONLY FROM THE NECK DOWN.  Do not use on open wounds or open sores. Avoid contact with your eyes, ears, mouth and genitals (private parts). Wash Face  and genitals (private parts)  with your normal soap.   Wash thoroughly, paying special attention to the area where your surgery will be performed.  Thoroughly rinse your body with warm water from the neck down.  DO NOT shower/wash with your normal soap after using and rinsing off the CHG Soap.  Pat yourself dry with a CLEAN TOWEL.  Wear CLEAN PAJAMAS to bed the night before surgery  Place CLEAN SHEETS on your bed the night before your surgery  DO NOT SLEEP WITH PETS.   Day of Surgery:  Take a shower with CHG soap. Wear Clean/Comfortable clothing the morning of surgery Do not apply any deodorants/lotions.   Remember to brush your teeth WITH YOUR REGULAR TOOTHPASTE.   Please read over the following fact sheets that you were given.

## 2021-04-07 ENCOUNTER — Encounter (HOSPITAL_COMMUNITY)
Admission: RE | Admit: 2021-04-07 | Discharge: 2021-04-07 | Disposition: A | Discharge status: Home / Self Care | Payer: 59 | Source: Ambulatory Visit | Attending: Orthopaedic Surgery | Admitting: Orthopaedic Surgery

## 2021-04-07 ENCOUNTER — Encounter: Payer: Self-pay | Admitting: Surgery

## 2021-04-07 ENCOUNTER — Other Ambulatory Visit (HOSPITAL_COMMUNITY): Payer: Self-pay

## 2021-04-07 ENCOUNTER — Encounter (HOSPITAL_COMMUNITY): Payer: Self-pay

## 2021-04-07 ENCOUNTER — Ambulatory Visit (INDEPENDENT_AMBULATORY_CARE_PROVIDER_SITE_OTHER): Payer: 59 | Admitting: Surgery

## 2021-04-07 ENCOUNTER — Other Ambulatory Visit: Payer: Self-pay

## 2021-04-07 VITALS — BP 142/85 | HR 87 | Ht <= 58 in | Wt 141.1 lb

## 2021-04-07 DIAGNOSIS — Z01818 Encounter for other preprocedural examination: Secondary | ICD-10-CM | POA: Insufficient documentation

## 2021-04-07 DIAGNOSIS — M48062 Spinal stenosis, lumbar region with neurogenic claudication: Secondary | ICD-10-CM

## 2021-04-07 LAB — SURGICAL PCR SCREEN
MRSA, PCR: NEGATIVE
Staphylococcus aureus: NEGATIVE

## 2021-04-07 LAB — CBC
HCT: 35.1 % — ABNORMAL LOW (ref 36.0–46.0)
Hemoglobin: 10.8 g/dL — ABNORMAL LOW (ref 12.0–15.0)
MCH: 21.9 pg — ABNORMAL LOW (ref 26.0–34.0)
MCHC: 30.8 g/dL (ref 30.0–36.0)
MCV: 71.1 fL — ABNORMAL LOW (ref 80.0–100.0)
Platelets: 280 10*3/uL (ref 150–400)
RBC: 4.94 MIL/uL (ref 3.87–5.11)
RDW: 15.3 % (ref 11.5–15.5)
WBC: 6.7 10*3/uL (ref 4.0–10.5)
nRBC: 0 % (ref 0.0–0.2)

## 2021-04-07 LAB — BASIC METABOLIC PANEL
Anion gap: 10 (ref 5–15)
BUN: 13 mg/dL (ref 6–20)
CO2: 27 mmol/L (ref 22–32)
Calcium: 8.9 mg/dL (ref 8.9–10.3)
Chloride: 106 mmol/L (ref 98–111)
Creatinine, Ser: 0.62 mg/dL (ref 0.44–1.00)
GFR, Estimated: >60 mL/min (ref >60)
Glucose, Bld: 98 mg/dL (ref 70–99)
Potassium: 3.2 mmol/L — ABNORMAL LOW (ref 3.5–5.1)
Sodium: 143 mmol/L (ref 135–145)

## 2021-04-07 NOTE — Anesthesia Preprocedure Evaluation (Addendum)
Anesthesia Evaluation  Patient identified by MRN, date of birth, ID band Patient awake    Reviewed: Allergy & Precautions, NPO status , Patient's Chart, lab work & pertinent test results  Airway Mallampati: I  TM Distance: >3 FB Neck ROM: Full    Dental  (+) Chipped,    Pulmonary Current Smoker and Patient abstained from smoking.,    Pulmonary exam normal breath sounds clear to auscultation       Cardiovascular hypertension, Pt. on medications Normal cardiovascular exam Rhythm:Regular Rate:Normal  ECG: NSR, rate 79   Neuro/Psych  Headaches, Anxiety TIACVA, No Residual Symptoms    GI/Hepatic Neg liver ROS, hiatal hernia, GERD  Medicated and Controlled,  Endo/Other  negative endocrine ROS  Renal/GU negative Renal ROS     Musculoskeletal  (+) Arthritis ,   Abdominal (+) - obese,   Peds  Hematology  (+) anemia , HLD   Anesthesia Other Findings L4-5, L5-S1 stenosis  Reproductive/Obstetrics                            Anesthesia Physical Anesthesia Plan  ASA: 2  Anesthesia Plan: General   Post-op Pain Management:    Induction: Intravenous  PONV Risk Score and Plan: 2 and Ondansetron, Dexamethasone, Midazolam and Treatment may vary due to age or medical condition  Airway Management Planned: Oral ETT  Additional Equipment:   Intra-op Plan:   Post-operative Plan: Extubation in OR  Informed Consent: I have reviewed the patients History and Physical, chart, labs and discussed the procedure including the risks, benefits and alternatives for the proposed anesthesia with the patient or authorized representative who has indicated his/her understanding and acceptance.     Dental advisory given  Plan Discussed with: CRNA  Anesthesia Plan Comments: (PAT note by Karoline Caldwell, PA-C: Patient was evaluated in 2015 for episode of chest pain.  Nuclear stress test 07/11/2013 showed no evidence for  pharmacological induced ischemia, normal EF 64% with normal wall motion.  Questionable history of TIA 2015, patient reports no residual.  Preop labs reviewed, mild hypokalemia with potassium 3.2, otherwise unremarkable.   EKG 04/07/2021: NSR.  Rate 79.  Nuclear stress 07/11/2013: IMPRESSION:  No evidence for pharmacological induced ischemia.   Calculated ejection fraction is 64% with normal wall motion.  )       Anesthesia Quick Evaluation

## 2021-04-07 NOTE — Progress Notes (Signed)
PCP - [facility] Cardiologist - denies  Chest x-ray - n/a EKG - 04/07/21 ECHO - 2008  ERAS Protcol - yes, no drink ordered or given   COVID TEST- 04/11/21 (outpatient in bed)   Anesthesia review: n/a  Patient denies shortness of breath, fever, cough and chest pain at PAT appointment   All instructions explained to the patient, with a verbal understanding of the material. Patient agrees to go over the instructions while at home for a better understanding. Patient also instructed to self quarantine after being tested for COVID-19. The opportunity to ask questions was provided.

## 2021-04-07 NOTE — Progress Notes (Signed)
Anesthesia Chart Review:  Patient was evaluated in 2015 for episode of chest pain.  Nuclear stress test 07/11/2013 showed no evidence for pharmacological induced ischemia, normal EF 64% with normal wall motion.  Questionable history of TIA 2015, patient reports no residual.  Preop labs reviewed, mild hypokalemia with potassium 3.2, otherwise unremarkable.   EKG 04/07/2021: NSR.  Rate 79.  Nuclear stress 07/11/2013: IMPRESSION:  No evidence for pharmacological induced ischemia.   Calculated ejection fraction is 64% with normal wall motion.     Wynonia Musty [facility]/Anesthesiology Phone [phone] 04/07/2021 3:32 PM

## 2021-04-08 NOTE — H&P (Signed)
[name] is an 60 y.o. female.   Chief Complaint: Back pain and lower extremity radiculopathy HPI: 60 year old female with history of L4-5 L5-S1 stenosis comes in for preop evaluation.  Symptoms unchanged from previous visit.  She is wanting to proceed with L4-5 and L5-S1 decompression as scheduled.  Today history and physical performed.  Review of systems negative.  Past Medical History:  Diagnosis Date   Anemia    Arthritis    "neck, hips" (04/25/2018)   Claustrophobia    Daily headache    GERD (gastroesophageal reflux disease)    Hiatal hernia    Hyperlipidemia    "hx" (04/25/2018)   Hypertension    Plantar fasciitis    hx - left foot   Stroke ([place]) 05/2014   "mini stroke" (04/25/2018)    Past Surgical History:  Procedure Laterality Date   ABDOMINAL HYSTERECTOMY  12/09   Secondary to fibroids   ARTERY BIOPSY Right 04/10/2018   Procedure: BIOPSY TEMPORAL ARTERY;  Surgeon: Rosetta Posner, MD;  Location: [facility] OR;  Service: Vascular;  Laterality: Right;   [place]; [place] Left 03/2017   Bone Spurs Removed   RADIOLOGY WITH ANESTHESIA N/A 04/26/2018   Procedure: MRI WITH ANESTHESIA;  Surgeon: Radiologist, Medication, MD;  Location: [place];  Service: Radiology;  Laterality: N/A;   RADIOLOGY WITH ANESTHESIA N/A 08/24/2019   Procedure: MRI WITH ANESTHESIA CERVICAL SPINE;  Surgeon: Radiologist, Medication, MD;  Location: [place];  Service: Radiology;  Laterality: N/A;   RADIOLOGY WITH ANESTHESIA N/A 10/02/2019   Procedure: MRI WITH ANESTHESIA CERVICAL WITHOUT CONTRAST;  Surgeon: Radiologist, Medication, MD;  Location: [place];  Service: Radiology;  Laterality: N/A;   TUBAL LIGATION  1988    Family History  Problem Relation Age of Onset   Cancer Mother 96       pancreatic cancer   Pancreatic cancer Mother    Heart disease Mother    Cancer Father 83       throat cancer   Esophageal cancer Father    Cancer Maternal Aunt 44       breast  cancer   Cancer Other        Died  from Colon cancer in 60's   Colon cancer Maternal Uncle    Colon cancer Paternal Uncle    Heart disease Sister    Rectal cancer Neg Hx    Stomach cancer Neg Hx    Social History:  reports that she has been smoking cigarettes. She has a 9.50 pack-year smoking history. She has never used smokeless tobacco. She reports current alcohol use of about 4.0 standard drinks per week. She reports that she does not currently use drugs.  Allergies:  Allergies  Allergen Reactions   Aspirin Hives   Hydromorphone Hcl Hives and Nausea And Vomiting   Latex Rash    No medications prior to admission.    Results for orders placed or performed during the hospital encounter of 04/07/21 (from the past 48 hour(s))  Basic metabolic panel per protocol     Status: Abnormal   Collection Time: 04/07/21  2:20 PM  Result Value Ref Range   Sodium 143 135 - 145 mmol/L   Potassium 3.2 (L) 3.5 - 5.1 mmol/L   Chloride 106 98 - 111 mmol/L   CO2 27 22 - 32 mmol/L   Glucose, Bld 98 70 - 99 mg/dL    Comment: Glucose reference range applies only to  samples taken after fasting for at least 8 hours.   BUN 13 6 - 20 mg/dL   Creatinine, Ser 0.62 0.44 - 1.00 mg/dL   Calcium 8.9 8.9 - 10.3 mg/dL   GFR, Estimated >60 >60 mL/min    Comment: (NOTE) Calculated using the CKD-EPI Creatinine Equation (2021)    Anion gap 10 5 - 15    Comment: Performed at [facility] [address] [place], [place] [address]  CBC per protocol     Status: Abnormal   Collection Time: 04/07/21  2:20 PM  Result Value Ref Range   WBC 6.7 4.0 - 10.5 K/uL   RBC 4.94 3.87 - 5.11 MIL/uL   Hemoglobin 10.8 (L) 12.0 - 15.0 g/dL   HCT 35.1 (L) 36.0 - 46.0 %   MCV 71.1 (L) 80.0 - 100.0 fL   MCH 21.9 (L) 26.0 - 34.0 pg   MCHC 30.8 30.0 - 36.0 g/dL   RDW 15.3 11.5 - 15.5 %   Platelets 280 150 - 400 K/uL   nRBC 0.0 0.0 - 0.2 %    Comment: Performed at [facility], [place] [address] [place], [place]  [address]  Surgical pcr screen     Status: None   Collection Time: 04/07/21  2:41 PM   Specimen: Nasal Mucosa; Nasal Swab  Result Value Ref Range   MRSA, PCR NEGATIVE NEGATIVE   Staphylococcus aureus NEGATIVE NEGATIVE    Comment: (NOTE) The Xpert SA Assay (FDA approved for NASAL specimens in patients 67 years of age and older), is one component of a comprehensive surveillance program. It is not intended to diagnose infection nor to guide or monitor treatment. Performed at [facility], [place] [address], [place], [place] [address]    No results found.  Review of Systems  Constitutional:  Positive for activity change.  HENT: Negative.    Respiratory: Negative.    Cardiovascular: Negative.   Gastrointestinal: Negative.   Genitourinary: Negative.   Musculoskeletal:  Positive for back pain and gait problem.  Neurological:  Positive for light-headedness.   There were no vitals taken for this visit. Physical Exam HENT:     Head: Normocephalic and atraumatic.     Nose: Nose normal.  Eyes:     Extraocular Movements: Extraocular movements intact.  Cardiovascular:     Rate and Rhythm: Regular rhythm.     Heart sounds: Normal heart sounds.  Pulmonary:     Effort: Pulmonary effort is normal. No respiratory distress.     Breath sounds: Normal breath sounds.  Abdominal:     General: Bowel sounds are normal.     Tenderness: There is no abdominal tenderness.  Musculoskeletal:        General: Tenderness present.  Neurological:     Mental Status: She is alert and oriented to person, place, and time.  Psychiatric:        Mood and Affect: Mood normal.     Assessment/Plan L4-5 and L5-S1 stenosis.  We will proceed with L4-5 and L5-S1 decompression is scheduled.  Surgical procedure discussed along with potential rehab/recovery time.  All questions answered.  Benjiman Core, PA-C 04/08/2021, 1:42 PM

## 2021-04-08 NOTE — Progress Notes (Signed)
60 year old female with history of L4-5 L5-S1 stenosis comes in for preop evaluation.  Symptoms unchanged from previous visit.  She is wanting to proceed with L4-5 and L5-S1 decompression as scheduled.  Today history and physical performed.  Review of systems negative.  Surgical procedure discussed along with potential rehab/recovery time.  All questions answered.

## 2021-04-11 ENCOUNTER — Other Ambulatory Visit (HOSPITAL_COMMUNITY)
Admission: RE | Admit: 2021-04-11 | Discharge: 2021-04-11 | Disposition: A | Discharge status: Home / Self Care | Payer: 59 | Source: Ambulatory Visit | Attending: Orthopaedic Surgery | Admitting: Orthopaedic Surgery

## 2021-04-11 DIAGNOSIS — Z20822 Contact with and (suspected) exposure to covid-19: Secondary | ICD-10-CM | POA: Diagnosis not present

## 2021-04-11 DIAGNOSIS — Z01812 Encounter for preprocedural laboratory examination: Secondary | ICD-10-CM | POA: Insufficient documentation

## 2021-04-11 LAB — SARS CORONAVIRUS 2 (TAT 6-24 HRS): SARS Coronavirus 2: NEGATIVE

## 2021-04-13 ENCOUNTER — Ambulatory Visit (HOSPITAL_COMMUNITY): Payer: 59

## 2021-04-13 ENCOUNTER — Observation Stay (HOSPITAL_COMMUNITY)
Admission: RE | Admit: 2021-04-13 | Discharge: 2021-04-14 | Disposition: A | Discharge status: Home / Self Care | Payer: 59 | Source: Ambulatory Visit | Attending: Orthopaedic Surgery | Admitting: Orthopaedic Surgery

## 2021-04-13 ENCOUNTER — Ambulatory Visit (HOSPITAL_COMMUNITY): Payer: 59 | Admitting: Anesthesiology

## 2021-04-13 ENCOUNTER — Ambulatory Visit (HOSPITAL_COMMUNITY): Payer: 59 | Admitting: Physician Assistant

## 2021-04-13 ENCOUNTER — Encounter (HOSPITAL_COMMUNITY)
Admission: RE | Disposition: A | Discharge status: Home / Self Care | Payer: Self-pay | Source: Ambulatory Visit | Attending: Orthopaedic Surgery

## 2021-04-13 ENCOUNTER — Other Ambulatory Visit: Payer: Self-pay

## 2021-04-13 ENCOUNTER — Encounter (HOSPITAL_COMMUNITY): Payer: Self-pay | Admitting: Orthopaedic Surgery

## 2021-04-13 DIAGNOSIS — F17210 Nicotine dependence, cigarettes, uncomplicated: Secondary | ICD-10-CM | POA: Insufficient documentation

## 2021-04-13 DIAGNOSIS — Z91040 Latex allergy status: Secondary | ICD-10-CM | POA: Diagnosis not present

## 2021-04-13 DIAGNOSIS — M48061 Spinal stenosis, lumbar region without neurogenic claudication: Secondary | ICD-10-CM | POA: Diagnosis present

## 2021-04-13 DIAGNOSIS — I10 Essential (primary) hypertension: Secondary | ICD-10-CM | POA: Diagnosis not present

## 2021-04-13 DIAGNOSIS — M48062 Spinal stenosis, lumbar region with neurogenic claudication: Secondary | ICD-10-CM | POA: Diagnosis not present

## 2021-04-13 DIAGNOSIS — Z419 Encounter for procedure for purposes other than remedying health state, unspecified: Secondary | ICD-10-CM

## 2021-04-13 HISTORY — PX: LUMBAR LAMINECTOMY/DECOMPRESSION MICRODISCECTOMY: SHX5026

## 2021-04-13 SURGERY — LUMBAR LAMINECTOMY/DECOMPRESSION MICRODISCECTOMY
Anesthesia: General | Site: Back

## 2021-04-13 MED ORDER — CEFAZOLIN SODIUM-DEXTROSE 2-4 GM/100ML-% IV SOLN
2.0000 g | INTRAVENOUS | Status: AC
  Administered 2021-04-13: 2 g via INTRAVENOUS
  Filled 2021-04-13: qty 100

## 2021-04-13 MED ORDER — SODIUM CHLORIDE 0.9% FLUSH: 3.0000 mL | INTRAVENOUS | Status: DC | PRN

## 2021-04-13 MED ORDER — PANTOPRAZOLE SODIUM 40 MG PO TBEC
40.0000 mg | DELAYED_RELEASE_TABLET | Freq: Every day | ORAL | Status: DC
  Administered 2021-04-14: 40 mg via ORAL
  Filled 2021-04-13: qty 1

## 2021-04-13 MED ORDER — NEOSTIGMINE METHYLSULFATE 3 MG/3ML IV SOSY
PREFILLED_SYRINGE | INTRAVENOUS | Status: AC
  Filled 2021-04-13: qty 3

## 2021-04-13 MED ORDER — PROMETHAZINE HCL 25 MG/ML IJ SOLN: 6.2500 mg | INTRAMUSCULAR | Status: DC | PRN

## 2021-04-13 MED ORDER — ONDANSETRON HCL 4 MG/2ML IJ SOLN: 4.0000 mg | Freq: Four times a day (QID) | INTRAMUSCULAR | Status: DC | PRN

## 2021-04-13 MED ORDER — PROPOFOL 10 MG/ML IV BOLUS
INTRAVENOUS | Status: AC
  Filled 2021-04-13: qty 20

## 2021-04-13 MED ORDER — PHENOL 1.4 % MT LIQD: 1.0000 | OROMUCOSAL | Status: DC | PRN

## 2021-04-13 MED ORDER — CHLORHEXIDINE GLUCONATE 0.12 % MT SOLN
15.0000 mL | Freq: Once | OROMUCOSAL | Status: AC
  Administered 2021-04-13: 15 mL via OROMUCOSAL
  Filled 2021-04-13: qty 15

## 2021-04-13 MED ORDER — HEMOSTATIC AGENTS (NO CHARGE) OPTIME
TOPICAL | Status: DC | PRN
Start: 2021-04-13 — End: 2021-04-13
  Administered 2021-04-13: 1 via TOPICAL

## 2021-04-13 MED ORDER — FENTANYL CITRATE (PF) 100 MCG/2ML IJ SOLN
INTRAMUSCULAR | Status: AC
  Filled 2021-04-13: qty 2

## 2021-04-13 MED ORDER — FENTANYL CITRATE (PF) 250 MCG/5ML IJ SOLN
INTRAMUSCULAR | Status: AC
  Filled 2021-04-13: qty 5

## 2021-04-13 MED ORDER — SODIUM CHLORIDE 0.9 % IV SOLN: INTRAVENOUS | Status: DC

## 2021-04-13 MED ORDER — BUPIVACAINE HCL (PF) 0.25 % IJ SOLN
INTRAMUSCULAR | Status: DC | PRN
  Administered 2021-04-13: 10 mL

## 2021-04-13 MED ORDER — MIDAZOLAM HCL 5 MG/5ML IJ SOLN
INTRAMUSCULAR | Status: DC | PRN
  Administered 2021-04-13: 2 mg via INTRAVENOUS

## 2021-04-13 MED ORDER — ROSUVASTATIN CALCIUM 20 MG PO TABS: 20.0000 mg | ORAL_TABLET | Freq: Every day | ORAL | Status: DC

## 2021-04-13 MED ORDER — PHENYLEPHRINE 40 MCG/ML (10ML) SYRINGE FOR IV PUSH (FOR BLOOD PRESSURE SUPPORT)
PREFILLED_SYRINGE | INTRAVENOUS | Status: AC
  Filled 2021-04-13: qty 50

## 2021-04-13 MED ORDER — FENTANYL CITRATE (PF) 250 MCG/5ML IJ SOLN
INTRAMUSCULAR | Status: DC | PRN
  Administered 2021-04-13: 100 ug via INTRAVENOUS
  Administered 2021-04-13: 150 ug via INTRAVENOUS
  Administered 2021-04-13: 50 ug via INTRAVENOUS

## 2021-04-13 MED ORDER — SODIUM CHLORIDE 0.9 % IV SOLN: 250.0000 mL | INTRAVENOUS | Status: DC

## 2021-04-13 MED ORDER — BUPIVACAINE HCL (PF) 0.25 % IJ SOLN
INTRAMUSCULAR | Status: AC
  Filled 2021-04-13: qty 30

## 2021-04-13 MED ORDER — LIDOCAINE HCL (CARDIAC) PF 100 MG/5ML IV SOSY
PREFILLED_SYRINGE | INTRAVENOUS | Status: DC | PRN
  Administered 2021-04-13: 60 mg via INTRAVENOUS

## 2021-04-13 MED ORDER — PHENYLEPHRINE HCL (PRESSORS) 10 MG/ML IV SOLN
INTRAVENOUS | Status: DC | PRN
  Administered 2021-04-13: 100 ug via INTRAVENOUS

## 2021-04-13 MED ORDER — METHOCARBAMOL 500 MG PO TABS: 500.0000 mg | ORAL_TABLET | Freq: Four times a day (QID) | ORAL | 0 refills | Status: DC | PRN

## 2021-04-13 MED ORDER — 0.9 % SODIUM CHLORIDE (POUR BTL) OPTIME
TOPICAL | Status: DC | PRN
  Administered 2021-04-13: 1000 mL

## 2021-04-13 MED ORDER — ORAL CARE MOUTH RINSE: 15.0000 mL | Freq: Once | OROMUCOSAL | Status: AC

## 2021-04-13 MED ORDER — GLYCOPYRROLATE PF 0.2 MG/ML IJ SOSY
PREFILLED_SYRINGE | INTRAMUSCULAR | Status: AC
  Filled 2021-04-13: qty 1

## 2021-04-13 MED ORDER — METHOCARBAMOL 500 MG PO TABS
500.0000 mg | ORAL_TABLET | Freq: Four times a day (QID) | ORAL | Status: DC | PRN
  Administered 2021-04-13 – 2021-04-14 (×2): 500 mg via ORAL
  Filled 2021-04-13 (×2): qty 1

## 2021-04-13 MED ORDER — SUGAMMADEX SODIUM 200 MG/2ML IV SOLN
INTRAVENOUS | Status: DC | PRN
  Administered 2021-04-13: 200 mg via INTRAVENOUS

## 2021-04-13 MED ORDER — MIDAZOLAM HCL 2 MG/2ML IJ SOLN
INTRAMUSCULAR | Status: AC
  Filled 2021-04-13: qty 2

## 2021-04-13 MED ORDER — ONDANSETRON HCL 4 MG PO TABS
4.0000 mg | ORAL_TABLET | Freq: Four times a day (QID) | ORAL | Status: DC | PRN
  Administered 2021-04-14: 4 mg via ORAL
  Filled 2021-04-13: qty 1

## 2021-04-13 MED ORDER — FENTANYL CITRATE (PF) 100 MCG/2ML IJ SOLN
25.0000 ug | INTRAMUSCULAR | Status: DC | PRN
  Administered 2021-04-13 (×2): 25 ug via INTRAVENOUS
  Administered 2021-04-13: 50 ug via INTRAVENOUS

## 2021-04-13 MED ORDER — LACTATED RINGERS IV SOLN: INTRAVENOUS | Status: DC | PRN

## 2021-04-13 MED ORDER — AMISULPRIDE (ANTIEMETIC) 5 MG/2ML IV SOLN
10.0000 mg | Freq: Once | INTRAVENOUS | Status: AC | PRN
  Administered 2021-04-13: 10 mg via INTRAVENOUS

## 2021-04-13 MED ORDER — ONDANSETRON HCL 4 MG/2ML IJ SOLN
INTRAMUSCULAR | Status: AC
  Filled 2021-04-13: qty 6

## 2021-04-13 MED ORDER — LISINOPRIL 20 MG PO TABS
30.0000 mg | ORAL_TABLET | Freq: Every day | ORAL | Status: DC
  Administered 2021-04-14: 30 mg via ORAL
  Filled 2021-04-13: qty 1

## 2021-04-13 MED ORDER — OXYCODONE HCL 5 MG PO TABS
5.0000 mg | ORAL_TABLET | ORAL | Status: DC | PRN
  Administered 2021-04-13 – 2021-04-14 (×3): 10 mg via ORAL
  Administered 2021-04-14: 5 mg via ORAL
  Administered 2021-04-14: 10 mg via ORAL
  Filled 2021-04-13 (×5): qty 2

## 2021-04-13 MED ORDER — DOCUSATE SODIUM 100 MG PO CAPS
100.0000 mg | ORAL_CAPSULE | Freq: Two times a day (BID) | ORAL | Status: DC
  Administered 2021-04-13 – 2021-04-14 (×2): 100 mg via ORAL
  Filled 2021-04-13 (×2): qty 1

## 2021-04-13 MED ORDER — DEXAMETHASONE SODIUM PHOSPHATE 10 MG/ML IJ SOLN
INTRAMUSCULAR | Status: AC
  Filled 2021-04-13: qty 3

## 2021-04-13 MED ORDER — SODIUM CHLORIDE 0.9% FLUSH: 3.0000 mL | Freq: Two times a day (BID) | INTRAVENOUS | Status: DC

## 2021-04-13 MED ORDER — ONDANSETRON HCL 4 MG/2ML IJ SOLN
INTRAMUSCULAR | Status: DC | PRN
  Administered 2021-04-13: 4 mg via INTRAVENOUS

## 2021-04-13 MED ORDER — ACETAMINOPHEN 10 MG/ML IV SOLN
INTRAVENOUS | Status: AC
  Filled 2021-04-13: qty 100

## 2021-04-13 MED ORDER — OXYCODONE-ACETAMINOPHEN 5-325 MG PO TABS: 1.0000 | ORAL_TABLET | ORAL | 0 refills | Status: DC | PRN

## 2021-04-13 MED ORDER — MENTHOL 3 MG MT LOZG: 1.0000 | LOZENGE | OROMUCOSAL | Status: DC | PRN

## 2021-04-13 MED ORDER — ACETAMINOPHEN 10 MG/ML IV SOLN
1000.0000 mg | Freq: Once | INTRAVENOUS | Status: DC | PRN
  Administered 2021-04-13: 1000 mg via INTRAVENOUS

## 2021-04-13 MED ORDER — LACTATED RINGERS IV SOLN: INTRAVENOUS | Status: DC

## 2021-04-13 MED ORDER — LIDOCAINE 2% (20 MG/ML) 5 ML SYRINGE
INTRAMUSCULAR | Status: AC
  Filled 2021-04-13: qty 20

## 2021-04-13 MED ORDER — ACETAMINOPHEN 325 MG PO TABS
650.0000 mg | ORAL_TABLET | ORAL | Status: DC | PRN
  Administered 2021-04-14: 650 mg via ORAL
  Filled 2021-04-13: qty 2

## 2021-04-13 MED ORDER — AMLODIPINE BESYLATE 5 MG PO TABS
5.0000 mg | ORAL_TABLET | Freq: Every day | ORAL | Status: DC
  Administered 2021-04-14: 5 mg via ORAL
  Filled 2021-04-13: qty 1

## 2021-04-13 MED ORDER — AMISULPRIDE (ANTIEMETIC) 5 MG/2ML IV SOLN
INTRAVENOUS | Status: AC
  Filled 2021-04-13: qty 4

## 2021-04-13 MED ORDER — PROPOFOL 10 MG/ML IV BOLUS
INTRAVENOUS | Status: DC | PRN
  Administered 2021-04-13: 150 mg via INTRAVENOUS

## 2021-04-13 MED ORDER — ACETAMINOPHEN 650 MG RE SUPP: 650.0000 mg | RECTAL | Status: DC | PRN

## 2021-04-13 MED ORDER — ROCURONIUM BROMIDE 100 MG/10ML IV SOLN
INTRAVENOUS | Status: DC | PRN
  Administered 2021-04-13: 50 mg via INTRAVENOUS

## 2021-04-13 SURGICAL SUPPLY — 42 items
BAG COUNTER SPONGE SURGICOUNT (BAG) ×2 IMPLANT
BUR ROUND FLUTED 4 SOFT TCH (BURR) ×2 IMPLANT
CANISTER SUCT 3000ML PPV (MISCELLANEOUS) ×2 IMPLANT
CLSR STERI-STRIP ANTIMIC 1/2X4 (GAUZE/BANDAGES/DRESSINGS) IMPLANT
COVER SURGICAL LIGHT HANDLE (MISCELLANEOUS) ×2 IMPLANT
DECANTER SPIKE VIAL GLASS SM (MISCELLANEOUS) IMPLANT
DRAPE HALF SHEET 40X57 (DRAPES) ×4 IMPLANT
DRAPE MICROSCOPE LEICA (MISCELLANEOUS) ×2 IMPLANT
DRAPE SURG 17X23 STRL (DRAPES) ×2 IMPLANT
DRSG MEPILEX BORDER 4X4 (GAUZE/BANDAGES/DRESSINGS) IMPLANT
DRSG MEPILEX BORDER 4X8 (GAUZE/BANDAGES/DRESSINGS) ×2 IMPLANT
DURAPREP 26ML APPLICATOR (WOUND CARE) ×2 IMPLANT
ELECT REM PT RETURN 9FT ADLT (ELECTROSURGICAL) ×2
ELECTRODE REM PT RTRN 9FT ADLT (ELECTROSURGICAL) ×1 IMPLANT
GLOVE SRG 8 PF TXTR STRL LF DI (GLOVE) ×2 IMPLANT
GLOVE SURG ORTHO LTX SZ7.5 (GLOVE) ×4 IMPLANT
GLOVE SURG UNDER POLY LF SZ8 (GLOVE) ×4
GOWN STRL REUS W/ TWL LRG LVL3 (GOWN DISPOSABLE) ×2 IMPLANT
GOWN STRL REUS W/ TWL XL LVL3 (GOWN DISPOSABLE) ×1 IMPLANT
GOWN STRL REUS W/TWL 2XL LVL3 (GOWN DISPOSABLE) ×2 IMPLANT
GOWN STRL REUS W/TWL LRG LVL3 (GOWN DISPOSABLE) ×4
GOWN STRL REUS W/TWL XL LVL3 (GOWN DISPOSABLE) ×2
KIT BASIN OR (CUSTOM PROCEDURE TRAY) ×2 IMPLANT
KIT TURNOVER KIT B (KITS) ×2 IMPLANT
MANIFOLD NEPTUNE II (INSTRUMENTS) ×2 IMPLANT
NEEDLE HYPO 25GX1X1/2 BEV (NEEDLE) ×2 IMPLANT
NEEDLE SPNL 18GX3.5 QUINCKE PK (NEEDLE) ×2 IMPLANT
NS IRRIG 1000ML POUR BTL (IV SOLUTION) ×2 IMPLANT
PACK LAMINECTOMY ORTHO (CUSTOM PROCEDURE TRAY) ×2 IMPLANT
PAD ARMBOARD 7.5X6 YLW CONV (MISCELLANEOUS) ×4 IMPLANT
PATTIES SURGICAL .5 X.5 (GAUZE/BANDAGES/DRESSINGS) ×4 IMPLANT
PATTIES SURGICAL .75X.75 (GAUZE/BANDAGES/DRESSINGS) ×2 IMPLANT
SURGIFLO W/THROMBIN 8M KIT (HEMOSTASIS) ×2 IMPLANT
SUT VIC AB 0 CT1 27 (SUTURE) ×4
SUT VIC AB 0 CT1 27XBRD ANBCTR (SUTURE) ×2 IMPLANT
SUT VIC AB 1 CTX 36 (SUTURE) ×2
SUT VIC AB 1 CTX36XBRD ANBCTR (SUTURE) ×1 IMPLANT
SUT VIC AB 2-0 CT1 27 (SUTURE) ×6
SUT VIC AB 2-0 CT1 TAPERPNT 27 (SUTURE) ×3 IMPLANT
SUT VIC AB 3-0 X1 27 (SUTURE) ×2 IMPLANT
TOWEL GREEN STERILE (TOWEL DISPOSABLE) ×2 IMPLANT
TOWEL GREEN STERILE FF (TOWEL DISPOSABLE) ×2 IMPLANT

## 2021-04-13 NOTE — Discharge Instructions (Addendum)
Ok to shower 1 days postop.  Do not apply any creams or ointments to incision.   Can use 4x4 gauze and tape for dressing changes if your dressing comes off. It is water proof and you can leave it on to shower.    Ok to do some walking around house.   No aggressive activity.    No twisting,bending, squatting or prolonged sitting.  Mostly be in reclined position or lying down.    No driving

## 2021-04-13 NOTE — Anesthesia Procedure Notes (Signed)
Procedure Name: Intubation Date/Time: 04/13/2021 2:01 PM Performed by: Eligha Bridegroom, CRNA Pre-anesthesia Checklist: Patient identified, Emergency Drugs available, Patient being monitored, Suction available and Timeout performed Patient Re-evaluated:Patient Re-evaluated prior to induction Oxygen Delivery Method: Circle system utilized Preoxygenation: Pre-oxygenation with 100% oxygen Induction Type: IV induction Ventilation: Mask ventilation without difficulty and Oral airway inserted - appropriate to patient size Laryngoscope Size: Mac and 4 Grade View: Grade II Tube type: Oral Tube size: 7.0 mm Number of attempts: 1 Airway Equipment and Method: Stylet Placement Confirmation: ETT inserted through vocal cords under direct vision, positive ETCO2 and breath sounds checked- equal and bilateral Secured at: 21 cm Tube secured with: Tape Dental Injury: Teeth and Oropharynx as per pre-operative assessment

## 2021-04-13 NOTE — Interval H&P Note (Signed)
History and Physical Interval Note:  04/13/2021 12:39 PM  [name]  has presented today for surgery, with the diagnosis of L4-5, L5-S1 stenosis.  The various methods of treatment have been discussed with the patient and family. After consideration of risks, benefits and other options for treatment, the patient has consented to  Procedure(s): L4-5 AND L5-S1 DECOMPRESSION (N/A) as a surgical intervention.  The patient's history has been reviewed, patient examined, no change in status, stable for surgery.  I have reviewed the patient's chart and labs.  Questions were answered to the patient's satisfaction.     Marybelle Killings

## 2021-04-13 NOTE — Op Note (Signed)
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  Postop diagnosis: Same  Procedure: Central decompression with laminectomy L4, L5, S1.  Surgeon: Rodell Perna, MD  Assistant: Benjiman Core, PA-C medically necessary and present for the entire procedure  Anesthesia G OT plus Marcaine skin local  Findings multifactoral spinal stenosis with hypertrophic ligamentum overhanging facets and mild disc bulge.  EBL: 100 cc  Procedure: After induction general anesthesia orotracheal ovation patient was placed prone on chest rolls careful padding his arms at 9090 position yellow-brown to crates calf pumpers.  1015 drape was applied at best to back was prepped with DuraPrep there is further towels Betadine Steri-Drape and laminectomy sheets and drapes.  Timeout procedure completed Ancef prophylaxis given.  Midline incision was made based on palpable landmarks from L4-S1.  X-ray arrived and spinal needle was placed above and below the area of planned decompression confirmed with crosstable lateral x-ray.  Cerebellar retractors placed followed by later placing the McCullough retractor using 2 blades on each side.  Exposure 1 at the level of facets and a Kocher clamp was placed above and below and a second x-ray was taken for their plan decompression confirmed appropriate level and then posterior elements were removed removing the spinous process standing the lamina with Kerrison and Leksell rongeur.  4 mm bur was used to thin the lamina and taking the remaining portions of the 3 and 4 mm Kerrison.  Thick chunks of ligament were removed gutters were cleaned of overhanging spurs or narrowing the canal and hypertrophic chunks of ligament which was causing the most stenosis principally at the levels of the L4-5 and L5-S1 disc.  Top portion of the spinous process of S1 was taken where there is area of posterior compression causing stenosis.  Laminectomy was done at S1 complete complete  laminectomy at L5 and there completed L4 progressing up leaving the top half of the lamina since there were no areas of compression at that level.  All chunks of ligament were removed operative microscope was used and the dural tube was round.  Surgi-Flo was placed in the gutters repeat irrigation inspected for bleeders bipolar cautery was used as needed.  Operative field was dry no remaining compression from either side.  Needle count pad count was correct.  Standard layered closure #1 Vicryl in the deep fascia 2 on subtenons tissue skin staple closure Marcaine infiltration and postop dressing.

## 2021-04-13 NOTE — Transfer of Care (Signed)
Immediate Anesthesia Transfer of Care Note  Patient: KERINGTON HILDEBRANT  Procedure(s) Performed: LUMBAR FOUR- LUMBAR FIVE AND LUMBAR FIVE-SACRAL ONE  DECOMPRESSION (Back)  Patient Location: PACU  Anesthesia Type:General  Level of Consciousness: awake and alert   Airway & Oxygen Therapy: Patient Spontanous Breathing and Patient connected to nasal cannula oxygen  Post-op Assessment: Report given to RN and Post -op Vital signs reviewed and stable  Post vital signs: Reviewed and stable  Last Vitals:  Vitals Value Taken Time  BP 104/60 04/13/21 1700  Temp 36.7 C 04/13/21 1729  Pulse 71 04/13/21 1707  Resp 14 04/13/21 1707  SpO2 99 % 04/13/21 1707  Vitals shown include unvalidated device data.  Last Pain:  Vitals:   04/13/21 1700  TempSrc:   PainSc: Asleep      Patients Stated Pain Goal: 1 (19/47/12 5271)  Complications: No notable events documented.

## 2021-04-13 NOTE — Anesthesia Postprocedure Evaluation (Signed)
Anesthesia Post Note  Patient: LELAND STASZEWSKI  Procedure(s) Performed: LUMBAR FOUR- LUMBAR FIVE AND LUMBAR FIVE-SACRAL ONE  DECOMPRESSION (Back)     Patient location during evaluation: PACU Anesthesia Type: General Level of consciousness: awake Pain management: pain level controlled Vital Signs Assessment: post-procedure vital signs reviewed and stable Respiratory status: spontaneous breathing, nonlabored ventilation, respiratory function stable and patient connected to nasal cannula oxygen Cardiovascular status: blood pressure returned to baseline and stable Postop Assessment: no apparent nausea or vomiting Anesthetic complications: no   No notable events documented.  Last Vitals:  Vitals:   04/13/21 1729 04/13/21 1745  BP:  129/72  Pulse:  64  Resp:  20  Temp: 36.7 C 36.7 C  SpO2:  100%    Last Pain:  Vitals:   04/13/21 1745  TempSrc: Oral  PainSc:                  Donnalyn Juran P Latissa Frick

## 2021-04-14 ENCOUNTER — Encounter (HOSPITAL_COMMUNITY): Payer: Self-pay | Admitting: Orthopaedic Surgery

## 2021-04-14 DIAGNOSIS — M48062 Spinal stenosis, lumbar region with neurogenic claudication: Secondary | ICD-10-CM | POA: Diagnosis not present

## 2021-04-14 MED ORDER — ONDANSETRON HCL 4 MG PO TABS: 4.0000 mg | ORAL_TABLET | Freq: Four times a day (QID) | ORAL | 0 refills | Status: DC | PRN

## 2021-04-14 NOTE — Evaluation (Signed)
Occupational Therapy Evaluation Patient Details Name: [name] MRN: [id number] DOB: 08/29/1960 Today's Date: 04/14/2021   History of Present Illness 60 y.o. female presenting for elective L4-5 and L5-S1 decompression with laminectomy. PMHx significant for anemia, OA,HLD, HTN, and Hx of TIA 03/2018.   Clinical Impression   PTA patient was living with her fiance in a private residence and was independent with ADLs/IADLs without AD. Patient currently presents near baseline level of function demonstrating observed ADLs with supervision to [place] guard grossly without AD. Min A required to don footwear. OT provided education on spinal precautions, home set-up to maximize safety and independence with self-care tasks, and acquisition/use of AE. Patient expressed verbal understanding. Only able to recall 2/3 spinal precautions after education likely secondary to post-op confusion. Patient would benefit from continued acute OT services in prep for safe d/c home. Plan to d/c home with sister-in-law later today.      Recommendations for follow up therapy are one component of a multi-disciplinary discharge planning process, led by the attending physician.  Recommendations may be updated based on patient status, additional functional criteria and insurance authorization.   Follow Up Recommendations  No OT follow up;Supervision - Intermittent    Equipment Recommendations  3 in 1 bedside commode    Recommendations for Other Services       Precautions / Restrictions Precautions Precautions: Back Precaution Booklet Issued: Yes (comment) Precaution Comments: Written and verbal education provided. Patient only able to recall 2/3 back precuations at conclusion of session. Required Braces or Orthoses:  (No brace needed) Restrictions Weight Bearing Restrictions: No      Mobility Bed Mobility Overal bed mobility: Needs Assistance Bed Mobility: Rolling;Sidelying to Sit Rolling:  Supervision Sidelying to sit: Supervision       General bed mobility comments: Supervision A for safety and cues for log rolling technique.    Transfers Overall transfer level: Needs assistance Equipment used: None Transfers: Sit to/from Stand Sit to Stand: Min guard         General transfer comment: Min guard for steadying for sit to stand from EOB.    Balance Overall balance assessment: Needs assistance Sitting-balance support: No upper extremity supported;Feet unsupported Sitting balance-Leahy Scale: Good     Standing balance support: No upper extremity supported;During functional activity Standing balance-Leahy Scale: Fair Standing balance comment: Patient able to maintain static standing balance without UE support. Reaches out for furniture for bracing with short-distance mobility.                           ADL either performed or assessed with clinical judgement   ADL Overall ADL's : Needs assistance/impaired Eating/Feeding: Independent   Grooming: Supervision/safety;Standing   Upper Body Bathing: Supervision/ safety;Sitting   Lower Body Bathing: Min guard;Sit to/from stand   Upper Body Dressing : Set up;Sitting   Lower Body Dressing: Minimal assistance;Sit to/from stand Lower Body Dressing Details (indicate cue type and reason): Able to thread BLE through LB clothing in figure-4 position seated EOB. Min A to don footwear. Toilet Transfer: Magazine features editor Details (indicate cue type and reason): Simulated with transfer to recliner. Patient initially reaching out for furniture for bracing.                 Vision Baseline Vision/History: 1 Wears glasses (Readers) Ability to See in Adequate Light: 0 Adequate Patient Visual Report: No change from baseline Vision Assessment?: No apparent visual deficits     Perception  Praxis      Pertinent Vitals/Pain Pain Assessment: 0-10 Pain Score: 10-Worst pain ever Pain Location:  Incision Pain Descriptors / Indicators: Aching;Sore Pain Intervention(s): Limited activity within patient's tolerance;Monitored during session;Premedicated before session;Repositioned     Hand Dominance Right   Extremity/Trunk Assessment Upper Extremity Assessment Upper Extremity Assessment: Overall WFL for tasks assessed   Lower Extremity Assessment Lower Extremity Assessment: Defer to PT evaluation   Cervical / Trunk Assessment Cervical / Trunk Assessment: Other exceptions Cervical / Trunk Exceptions: s/p lumbar surgery   Communication Communication Communication: No difficulties   Cognition Arousal/Alertness: Awake/alert (Post-op confusion) Behavior During Therapy: WFL for tasks assessed/performed Overall Cognitive Status: No family/caregiver present to determine baseline cognitive functioning                                 General Comments: Patient with mild post-op confusion. Only able to recall 2/3 back precautions despite education.   General Comments  Soft foam dressing at incision appears clean/dry.    Exercises     Shoulder Instructions      Home Living Family/patient expects to be discharged to:: Private residence Living Arrangements: Spouse/significant other;Other (Comment) (Plan to d/c to sister-in-laws home) Available Help at Discharge: Family Type of Home: Mobile home Home Access: Ramped entrance     Home Layout: One level     Bathroom Shower/Tub: Teacher, early years/pre: Standard     Home Equipment: None   Additional Comments: Home info listed above is for sister-in-laws house. Patient lives with her fiance in a 1-level private residence with 1 STE, tub/shower combo and comfort height commode.      Prior Functioning/Environment Level of Independence: Independent                 OT Problem List: Impaired balance (sitting and/or standing);Pain      OT Treatment/Interventions: Self-care/ADL training;Therapeutic  exercise;Energy conservation;DME and/or AE instruction;Therapeutic activities;Patient/family education;Balance training    OT Goals(Current goals can be found in the care plan section) Acute Rehab OT Goals Patient Stated Goal: To decrease pain. OT Goal Formulation: With patient Time For Goal Achievement: 04/28/21 Potential to Achieve Goals: Good ADL Goals Additional ADL Goal #1: Patient will complete ADLs with Mod I, good adherence to spinal precautions and use of LRAD PRN in prep for safe d/c home. Additional ADL Goal #2: Patient will recall 3/3 spinal precautions in prep for ADLs.  OT Frequency: Min 2X/week   Barriers to D/C:            Co-evaluation              AM-PAC OT "6 Clicks" Daily Activity     Outcome Measure Help from another person eating meals?: None Help from another person taking care of personal grooming?: A Little Help from another person toileting, which includes using toliet, bedpan, or urinal?: A Little Help from another person bathing (including washing, rinsing, drying)?: A Little Help from another person to put on and taking off regular upper body clothing?: A Little Help from another person to put on and taking off regular lower body clothing?: A Little 6 Click Score: 19   End of Session Nurse Communication: Mobility status  Activity Tolerance: Patient tolerated treatment well Patient left: in chair;with call bell/phone within reach  OT Visit Diagnosis: Unsteadiness on feet (R26.81);Pain Pain - part of body:  (low back)  Time: 0702-0723 OT Time Calculation (min): 21 min Charges:  OT General Charges $OT Visit: 1 Visit OT Evaluation $OT Eval Low Complexity: 1 Low  Willadean Guyton H. OTR/L Supplemental OT, Department of rehab services [phone]  Sylas Twombly R H. 04/14/2021, 8:04 AM

## 2021-04-14 NOTE — Evaluation (Signed)
Physical Therapy Evaluation Patient Details Name: [name] MRN: [id number] DOB: June 19, 1961 Today's Date: 04/14/2021  History of Present Illness  Pt is a 60 y/o female presenting for elective L4-5 and L5-S1 decompression with laminectomy. PMHx significant for anemia, OA,HLD, HTN, and Hx of TIA 03/2018.  Clinical Impression  Pt admitted with above diagnosis. At the time of PT eval, pt was able to demonstrate transfers and ambulation with up to supervision for safety and RW for support. Pt was educated on precautions, positioning recommendations, appropriate activity progression, and car transfer. Pt currently with functional limitations due to the deficits listed below (see PT Problem List). Pt will benefit from skilled PT to increase their independence and safety with mobility to allow discharge to the venue listed below.         Recommendations for follow up therapy are one component of a multi-disciplinary discharge planning process, led by the attending physician.  Recommendations may be updated based on patient status, additional functional criteria and insurance authorization.  Follow Up Recommendations No PT follow up;Supervision for mobility/OOB    Equipment Recommendations  Rolling walker with 5" wheels;3in1 (PT) (youth size walker)    Recommendations for Other Services       Precautions / Restrictions Precautions Precautions: Back Precaution Booklet Issued: Yes (comment) Precaution Comments: Written and verbal education provided. Patient only able to recall 2/3 back precuations at conclusion of session. Required Braces or Orthoses:  (No brace needed) Restrictions Weight Bearing Restrictions: No      Mobility  Bed Mobility Overal bed mobility: Modified Independent Bed Mobility: Rolling;Sit to Sidelying Rolling: Supervision Sidelying to sit: Supervision       General bed mobility comments: HOB flat and min use of railings. No assist required.     Transfers Overall transfer level: Needs assistance Equipment used: Rolling walker (2 wheeled) Transfers: Sit to/from Stand Sit to Stand: Min guard         General transfer comment: No assist required. Light supervision and VC's for hand placement on seated surface for safety.  Ambulation/Gait Ambulation/Gait assistance: Supervision Gait Distance (Feet): 500 Feet Assistive device: Rolling walker (2 wheeled) Gait Pattern/deviations: Step-through pattern;Decreased stride length;Trunk flexed Gait velocity: Decreased Gait velocity interpretation: <1.31 ft/sec, indicative of household ambulator General Gait Details: VC's for improved posture, forward gaze, and closer walker proximity. No assist required. 1 mild knee buckle on the L but pt able to recover independently.  Stairs            Wheelchair Mobility    Modified Rankin (Stroke Patients Only)       Balance Overall balance assessment: Needs assistance Sitting-balance support: No upper extremity supported;Feet unsupported Sitting balance-Leahy Scale: Fair     Standing balance support: No upper extremity supported;During functional activity Standing balance-Leahy Scale: Fair Standing balance comment: Patient able to maintain static standing balance without UE support. Reaches out for furniture for bracing with short-distance mobility.                             Pertinent Vitals/Pain Pain Assessment: 0-10 Pain Score: 10-Worst pain ever Pain Location: Incision Pain Descriptors / Indicators: Aching;Sore;Operative site guarding;Spasm Pain Intervention(s): Limited activity within patient's tolerance;Monitored during session;Repositioned    Home Living Family/patient expects to be discharged to:: Private residence Living Arrangements: Spouse/significant other;Other (Comment) (Plan to d/c to sister-in-laws home) Available Help at Discharge: Family Type of Home: Mobile home Home Access: Ramped entrance      Home  Layout: One level Home Equipment: None Additional Comments: Home info listed above is for sister-in-laws house. Patient lives with her fiance in a 1-level private residence with 1 STE, tub/shower combo and comfort height commode.    Prior Function Level of Independence: Independent               Hand Dominance   Dominant Hand: Right    Extremity/Trunk Assessment   Upper Extremity Assessment Upper Extremity Assessment: Defer to OT evaluation    Lower Extremity Assessment Lower Extremity Assessment: LLE deficits/detail LLE Deficits / Details: Lingering tingling in the LLE and mild weakness consistent with pre-op diagnosis    Cervical / Trunk Assessment Cervical / Trunk Assessment: Other exceptions Cervical / Trunk Exceptions: s/p lumbar surgery  Communication   Communication: No difficulties  Cognition Arousal/Alertness: Awake/alert Behavior During Therapy: WFL for tasks assessed/performed Overall Cognitive Status: No family/caregiver present to determine baseline cognitive functioning                                 General Comments: Patient with mild post-op confusion. Only able to recall 2/3 back precautions despite education.      General Comments General comments (skin integrity, edema, etc.): Soft foam dressing at incision appears clean/dry.    Exercises     Assessment/Plan    PT Assessment Patient needs continued PT services  PT Problem List Decreased strength;Decreased activity tolerance;Decreased balance;Decreased mobility;Decreased knowledge of use of DME;Decreased safety awareness;Decreased knowledge of precautions;Pain       PT Treatment Interventions DME instruction;Gait training;Functional mobility training;Therapeutic activities;Therapeutic exercise;Neuromuscular re-education;Patient/family education    PT Goals (Current goals can be found in the Care Plan section)  Acute Rehab PT Goals Patient Stated Goal: To decrease  pain. PT Goal Formulation: With patient Time For Goal Achievement: 04/21/21 Potential to Achieve Goals: Good    Frequency Min 5X/week   Barriers to discharge        Co-evaluation               AM-PAC PT "6 Clicks" Mobility  Outcome Measure Help needed turning from your back to your side while in a flat bed without using bedrails?: None Help needed moving from lying on your back to sitting on the side of a flat bed without using bedrails?: None Help needed moving to and from a bed to a chair (including a wheelchair)?: A Little Help needed standing up from a chair using your arms (e.g., wheelchair or bedside chair)?: A Little Help needed to walk in hospital room?: A Little Help needed climbing 3-5 steps with a railing? : A Little 6 Click Score: 20    End of Session   Activity Tolerance: Patient tolerated treatment well Patient left: in bed;with call bell/phone within reach Nurse Communication: Mobility status PT Visit Diagnosis: Unsteadiness on feet (R26.81);Pain Pain - part of body:  (Back, L side hip and leg)    Time: 6294-7654 PT Time Calculation (min) (ACUTE ONLY): 22 min   Charges:   PT Evaluation $PT Eval Low Complexity: 1 Low          Rolinda Roan, PT, DPT Acute Rehabilitation Services Pager: [phone] Office: [phone]   Thelma Comp 04/14/2021, 9:21 AM

## 2021-04-14 NOTE — Progress Notes (Signed)
Patient was transported to her vehicle via wheelchair by volunteer for discharge home; in no acute distress nor complaints of pain nor discomfort;  incision on her back was clean, dry and intact; room was checked and accounted for all her belongings; discharge instructions concerning her medications, wound care, follow up appointment and when to call the doctor were all discussed with patient by RN and she expressed understanding on the instructions given.

## 2021-04-14 NOTE — Plan of Care (Signed)
  Problem: Education: Goal: Ability to verbalize activity precautions or restrictions will improve Outcome: Completed/Met Goal: Knowledge of the prescribed therapeutic regimen will improve Outcome: Completed/Met Goal: Understanding of discharge needs will improve Outcome: Completed/Met   Problem: Bowel/Gastric: Goal: Gastrointestinal status for postoperative course will improve Outcome: Completed/Met   Problem: Clinical Measurements: Goal: Ability to maintain clinical measurements within normal limits will improve Outcome: Completed/Met Goal: Postoperative complications will be avoided or minimized Outcome: Completed/Met Goal: Diagnostic test results will improve Outcome: Completed/Met   Problem: Health Behavior/Discharge Planning: Goal: Identification of resources available to assist in meeting health care needs will improve Outcome: Completed/Met

## 2021-04-14 NOTE — Progress Notes (Signed)
Patient ID: [name], female   DOB: 26-Mar-1961, 60 y.o.   MRN: [id number]   Subjective: 1 Day Post-Op Procedure(s) (LRB): LUMBAR FOUR- LUMBAR FIVE AND LUMBAR FIVE-SACRAL ONE  DECOMPRESSION (N/A) Patient reports pain as mild and moderate.  N and V after breakfast and pain pills.   Objective: Vital signs in last 24 hours: Temp:  [98 F (36.7 C)-99.1 F (37.3 C)] 98.6 F (37 C) (10/20 0730) Pulse Rate:  [64-87] 73 (10/20 0730) Resp:  [12-20] 16 (10/20 0730) BP: (101-170)/(53-92) 134/87 (10/20 0730) SpO2:  [97 %-100 %] 98 % (10/20 0730) Weight:  [64 kg] 64 kg (10/19 0951)  Intake/Output from previous day: 10/19 0701 - 10/20 0700 In: 1700 [I.V.:1700] Out: 325 [Blood:325] Intake/Output this shift: No intake/output data recorded.  No results for input(s): HGB in the last 72 hours. No results for input(s): WBC, RBC, HCT, PLT in the last 72 hours. No results for input(s): NA, K, CL, CO2, BUN, CREATININE, GLUCOSE, CALCIUM in the last 72 hours. No results for input(s): LABPT, INR in the last 72 hours.  Neurologically intact, has been walking in hall.  DG Lumbar Spine 2-3 Views  Result Date: 04/13/2021 CLINICAL DATA:  Localization image. EXAM: LUMBAR SPINE - 2-3 VIEW COMPARISON:  Lumbar spine MRI 03/10/2021. FINDINGS: Instrumentation identified overlying the posterior elements of L4 and S1. No fractures are visualized. Alignment is anatomic. Disc spaces are maintained. IMPRESSION: Localization images lumbar spine. Electronically Signed   By: Ronney Asters M.D.   On: 04/13/2021 20:12    Assessment/Plan: 1 Day Post-Op Procedure(s) (LRB): LUMBAR FOUR- LUMBAR FIVE AND LUMBAR FIVE-SACRAL ONE  DECOMPRESSION (N/A) Plan: discharge home. Zofran added to d/c meds  Marybelle Killings 04/14/2021, 7:42 AM

## 2021-04-21 ENCOUNTER — Other Ambulatory Visit: Payer: Self-pay | Admitting: Orthopaedic Surgery

## 2021-04-21 ENCOUNTER — Telehealth: Payer: Self-pay | Admitting: Orthopaedic Surgery

## 2021-04-21 ENCOUNTER — Other Ambulatory Visit (HOSPITAL_COMMUNITY): Payer: Self-pay

## 2021-04-21 MED ORDER — METHOCARBAMOL 500 MG PO TABS
500.0000 mg | ORAL_TABLET | Freq: Four times a day (QID) | ORAL | 0 refills | Status: DC | PRN
  Filled 2021-04-21: qty 50, 13d supply, fill #0

## 2021-04-21 MED ORDER — OXYCODONE-ACETAMINOPHEN 5-325 MG PO TABS
1.0000 | ORAL_TABLET | ORAL | 0 refills | Status: DC | PRN
  Filled 2021-04-21: qty 50, 9d supply, fill #0

## 2021-04-21 NOTE — Telephone Encounter (Signed)
Pt calling for a refill oxycodone and methocarbamol. Pt asked that refills be sent to the [facility] if possible. The best call back number is [phone].

## 2021-04-22 ENCOUNTER — Encounter: Payer: 59 | Admitting: Orthopaedic Surgery

## 2021-04-22 NOTE — Discharge Summary (Signed)
Patient ID: [name] MRN: [id number] DOB/AGE: July 26, 1960 60 y.o.  Admit date: 04/13/2021 Discharge date: 04/14/2021  Admission Diagnoses:  Active Problems:   Lumbar stenosis   Discharge Diagnoses:  Active Problems:   Lumbar stenosis  status post Procedure(s): LUMBAR FOUR- LUMBAR FIVE AND LUMBAR FIVE-SACRAL ONE  DECOMPRESSION  Past Medical History:  Diagnosis Date   Anemia    Arthritis    "neck, hips" (04/25/2018)   Claustrophobia    Daily headache    GERD (gastroesophageal reflux disease)    Hiatal hernia    Hyperlipidemia    "hx" (04/25/2018)   Hypertension    Plantar fasciitis    hx - left foot   Stroke ([place]) 05/2014   "mini stroke" (04/25/2018)    Surgeries: Procedure(s): LUMBAR FOUR- LUMBAR FIVE AND LUMBAR FIVE-SACRAL ONE  DECOMPRESSION on 04/13/2021   Consultants:   Discharged Condition: Improved  Hospital Course: [name] is an 60 y.o. female who was admitted 04/13/2021 for operative treatment of lumbar stenosis. Patient failed conservative treatments (please see the history and physical for the specifics) and had severe unremitting pain that affects sleep, daily activities and work/hobbies. After pre-op clearance, the patient was taken to the operating room on 04/13/2021 and underwent  Procedure(s): LUMBAR FOUR- LUMBAR FIVE AND LUMBAR FIVE-SACRAL ONE  DECOMPRESSION.    Patient was given perioperative antibiotics:  Anti-infectives (From admission, onward)    Start     Dose/Rate Route Frequency Ordered Stop   04/13/21 1015  ceFAZolin (ANCEF) IVPB 2g/100 mL premix        2 g 200 mL/hr over 30 Minutes Intravenous On call to O.R. 04/13/21 1005 04/13/21 1345        Patient was given sequential compression devices and early ambulation to prevent DVT.   Patient benefited maximally from hospital stay and there were no complications. At the time of discharge, the patient was urinating/moving their bowels without difficulty, tolerating a  regular diet, pain is controlled with oral pain medications and they have been cleared by PT/OT.   Recent vital signs: No data found.   Recent laboratory studies: No results for input(s): WBC, HGB, HCT, PLT, NA, K, CL, CO2, BUN, CREATININE, GLUCOSE, INR, CALCIUM in the last 72 hours.  Invalid input(s): PT, 2   Discharge Medications:   Allergies as of 04/14/2021       Reactions   Aspirin Hives   Hydromorphone Hcl Hives, Nausea And Vomiting   Latex Rash        Medication List     STOP taking these medications    buPROPion 150 MG 12 hr tablet Commonly known as: WELLBUTRIN SR   diclofenac 75 MG EC tablet Commonly known as: VOLTAREN   Diclofenac Sodium 3 % Gel   meloxicam 15 MG tablet Commonly known as: MOBIC   ondansetron 4 MG disintegrating tablet Commonly known as: Zofran ODT   predniSONE 10 MG (21) Tbpk tablet Commonly known as: STERAPRED UNI-PAK 21 TAB   tiZANidine 2 MG tablet Commonly known as: ZANAFLEX       TAKE these medications    amLODipine 5 MG tablet Commonly known as: NORVASC TAKE 1 TABLET (5 MG TOTAL) BY MOUTH DAILY.   Creon 36000 UNITS Cpep capsule Generic drug: lipase/protease/amylase TAKE 3 CAPSULES BY MOUTH WITH EACH MEAL AND 2 CAPSULES WITH A SNACK   lisinopril 30 MG tablet Commonly known as: ZESTRIL Take 1 tablet (30 mg total) by mouth daily.   omeprazole 40 MG capsule Commonly known as:  PRILOSEC TAKE 1 CAPSULE (40 MG TOTAL) BY MOUTH DAILY.   ondansetron 4 MG tablet Commonly known as: ZOFRAN Take 1 tablet (4 mg total) by mouth every 6 (six) hours as needed for nausea or vomiting.   rosuvastatin 20 MG tablet Commonly known as: CRESTOR TAKE 1 TABLET (20 MG TOTAL) BY MOUTH DAILY.        Diagnostic Studies: DG Lumbar Spine 2-3 Views  Result Date: 04/13/2021 CLINICAL DATA:  Localization image. EXAM: LUMBAR SPINE - 2-3 VIEW COMPARISON:  Lumbar spine MRI 03/10/2021. FINDINGS: Instrumentation identified overlying the posterior  elements of L4 and S1. No fractures are visualized. Alignment is anatomic. Disc spaces are maintained. IMPRESSION: Localization images lumbar spine. Electronically Signed   By: Ronney Asters M.D.   On: 04/13/2021 20:12    Discharge Instructions     Incentive spirometry RT   Complete by: As directed         Follow-up Information     Marybelle Killings, MD. Schedule an appointment as soon as possible for a visit today.   Specialty: Orthopedic Surgery Why: need return office visit 2 weeks postop.  call to schedule appointment. Contact information: [address] [place] [address] [phone]                 Discharge Plan:  discharge home   Disposition:     Signed: Benjiman Core  04/22/2021, 11:22 AM

## 2021-04-26 ENCOUNTER — Other Ambulatory Visit: Payer: Self-pay

## 2021-04-26 ENCOUNTER — Other Ambulatory Visit (HOSPITAL_COMMUNITY): Payer: Self-pay

## 2021-04-26 ENCOUNTER — Encounter: Payer: Self-pay | Admitting: Orthopaedic Surgery

## 2021-04-26 ENCOUNTER — Ambulatory Visit (INDEPENDENT_AMBULATORY_CARE_PROVIDER_SITE_OTHER): Payer: 59 | Admitting: Orthopaedic Surgery

## 2021-04-26 DIAGNOSIS — Z9889 Other specified postprocedural states: Secondary | ICD-10-CM

## 2021-04-26 NOTE — Progress Notes (Signed)
Office Visit Note   Patient: [name]           Date of Birth: [birth year]           MRN: [id number] Visit Date: 04/26/2021              Requested by: Horald Pollen, MD [place],  [place] [id number] PCP: Horald Pollen, MD   Assessment & Plan: Visit Diagnoses: Post lumbar decompression L4-5 L5-S1 for spinal stenosis.  Incision looks good staples removed.  She remains out of work recheck 4 weeks.  She does housecleaning activity and we can discuss setting a work resumption date when she returns.  Plan: Return 4 weeks Staples harvested.  Follow-Up Instructions: No follow-ups on file.   Orders:  No orders of the defined types were placed in this encounter.  No orders of the defined types were placed in this encounter.     Procedures: No procedures performed   Clinical Data: No additional findings.   Subjective: Chief Complaint  Patient presents with   Lower Back - Routine Post Op    04/13/2021 L4-5, L5-S1 decompression    HPI  Review of Systems   Objective: Vital Signs: BP 111/72   Pulse 73   Ht 4\' 9"  (1.448 m)   Wt 141 lb (64 kg)   LMP  (LMP Unknown)   BMI 30.51 kg/m   Physical Exam  Ortho Exam  Specialty Comments:  No specialty comments available.  Imaging: No results found.   PMFS History: Patient Active Problem List   Diagnosis Date Noted   Lumbar stenosis 04/13/2021   Spinal stenosis of lumbar region 03/23/2021   Tension headache 12/22/2020   Low back pain 10/20/2020   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/28/2020   Chronic diarrhea 09/14/2017   H/O TIA (transient ischemic attack) and stroke 06/12/2014   Cervical radiculitis 02/12/2014   Essential hypertension, benign 01/24/2010   Microcytic anemia 01/11/2010   Hyperlipemia 05/28/2007   Current smoker 05/28/2007   GERD 05/28/2007   Past Medical History:  Diagnosis Date   Anemia    Arthritis    "neck, hips" (04/25/2018)   Claustrophobia     Daily headache    GERD (gastroesophageal reflux disease)    Hiatal hernia    Hyperlipidemia    "hx" (04/25/2018)   Hypertension    Plantar fasciitis    hx - left foot   Stroke ([place]) 05/2014   "mini stroke" (04/25/2018)    Family History  Problem Relation Age of Onset   Cancer Mother 55       pancreatic cancer   Pancreatic cancer Mother    Heart disease Mother    Cancer Father 33       throat cancer   Esophageal cancer Father    Cancer Maternal Aunt 60       breast cancer   Cancer Other        Died  from Colon cancer in 60's   Colon cancer Maternal Uncle    Colon cancer Paternal Uncle    Heart disease Sister    Rectal cancer Neg Hx    Stomach cancer Neg Hx     Past Surgical History:  Procedure Laterality Date   ABDOMINAL HYSTERECTOMY  12/09   Secondary to fibroids   ARTERY BIOPSY Right 04/10/2018   Procedure: BIOPSY TEMPORAL ARTERY;  Surgeon: Rosetta Posner, MD;  Location: [facility] OR;  Service: Vascular;  Laterality: Right;   CESAREAN SECTION  1986; 1988   COLONOSCOPY     FOOT SURGERY Left 03/2017   Bone Spurs Removed   LUMBAR LAMINECTOMY/DECOMPRESSION MICRODISCECTOMY N/A 04/13/2021   Procedure: LUMBAR FOUR- LUMBAR FIVE AND LUMBAR FIVE-SACRAL ONE  DECOMPRESSION;  Surgeon: Marybelle Killings, MD;  Location: [facility];  Service: Orthopedics;  Laterality: N/A;   RADIOLOGY WITH ANESTHESIA N/A 04/26/2018   Procedure: MRI WITH ANESTHESIA;  Surgeon: Radiologist, Medication, MD;  Location: [facility];  Service: Radiology;  Laterality: N/A;   RADIOLOGY WITH ANESTHESIA N/A 08/24/2019   Procedure: MRI WITH ANESTHESIA CERVICAL SPINE;  Surgeon: Radiologist, Medication, MD;  Location: [facility];  Service: Radiology;  Laterality: N/A;   RADIOLOGY WITH ANESTHESIA N/A 10/02/2019   Procedure: MRI WITH ANESTHESIA CERVICAL WITHOUT CONTRAST;  Surgeon: Radiologist, Medication, MD;  Location: [facility];  Service: Radiology;  Laterality: N/A;   TUBAL LIGATION  1988   Social History   Occupational History   Occupation:  unemployed    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

## 2021-04-27 ENCOUNTER — Telehealth: Payer: Self-pay | Admitting: Emergency Medicine

## 2021-04-27 NOTE — Telephone Encounter (Signed)
Patient says she has recently had back surgery & is having more acid reflux & is taking the medication more than usual  Wants provider to send in new script w/ increased dosage if possible  Pharmacy:  [facility]  Phone:  [phone] Fax:  [phone]

## 2021-04-28 ENCOUNTER — Other Ambulatory Visit: Payer: Self-pay | Admitting: Emergency Medicine

## 2021-04-28 ENCOUNTER — Other Ambulatory Visit (HOSPITAL_COMMUNITY): Payer: Self-pay

## 2021-04-28 MED ORDER — OMEPRAZOLE 40 MG PO CPDR
40.0000 mg | DELAYED_RELEASE_CAPSULE | Freq: Every day | ORAL | 3 refills | Status: DC
  Filled 2021-04-28: qty 90, fill #0
  Filled 2021-05-30: qty 90, 90d supply, fill #0

## 2021-04-28 NOTE — Telephone Encounter (Signed)
Medication refilled

## 2021-05-10 ENCOUNTER — Other Ambulatory Visit (HOSPITAL_COMMUNITY): Payer: Self-pay

## 2021-05-20 ENCOUNTER — Other Ambulatory Visit (HOSPITAL_COMMUNITY): Payer: Self-pay

## 2021-05-23 ENCOUNTER — Ambulatory Visit (INDEPENDENT_AMBULATORY_CARE_PROVIDER_SITE_OTHER): Payer: 59 | Admitting: Emergency Medicine

## 2021-05-23 ENCOUNTER — Encounter: Payer: Self-pay | Admitting: Emergency Medicine

## 2021-05-23 VITALS — BP 130/70 | HR 86 | Temp 98.4°F | Ht <= 58 in | Wt 140.0 lb

## 2021-05-23 DIAGNOSIS — K529 Noninfective gastroenteritis and colitis, unspecified: Secondary | ICD-10-CM

## 2021-05-23 DIAGNOSIS — R1013 Epigastric pain: Secondary | ICD-10-CM

## 2021-05-23 DIAGNOSIS — F17200 Nicotine dependence, unspecified, uncomplicated: Secondary | ICD-10-CM

## 2021-05-23 DIAGNOSIS — E7849 Other hyperlipidemia: Secondary | ICD-10-CM

## 2021-05-23 DIAGNOSIS — K219 Gastro-esophageal reflux disease without esophagitis: Secondary | ICD-10-CM

## 2021-05-23 DIAGNOSIS — I10 Essential (primary) hypertension: Secondary | ICD-10-CM

## 2021-05-23 DIAGNOSIS — Z8719 Personal history of other diseases of the digestive system: Secondary | ICD-10-CM | POA: Insufficient documentation

## 2021-05-23 DIAGNOSIS — M545 Low back pain, unspecified: Secondary | ICD-10-CM

## 2021-05-23 LAB — COMPREHENSIVE METABOLIC PANEL
ALT: 83 U/L — ABNORMAL HIGH (ref 0–35)
AST: 68 U/L — ABNORMAL HIGH (ref 0–37)
Albumin: 4.2 g/dL (ref 3.5–5.2)
Alkaline Phosphatase: 86 U/L (ref 39–117)
BUN: 11 mg/dL (ref 6–23)
CO2: 28 mEq/L (ref 19–32)
Calcium: 8.9 mg/dL (ref 8.4–10.5)
Chloride: 106 mEq/L (ref 96–112)
Creatinine, Ser: 0.55 mg/dL (ref 0.40–1.20)
GFR: 100.15 mL/min (ref >60.00)
Glucose, Bld: 86 mg/dL (ref 70–99)
Potassium: 3.8 mEq/L (ref 3.5–5.1)
Sodium: 144 mEq/L (ref 135–145)
Total Bilirubin: 0.3 mg/dL (ref 0.2–1.2)
Total Protein: 7.3 g/dL (ref 6.0–8.3)

## 2021-05-23 LAB — CBC WITH DIFFERENTIAL/PLATELET
Basophils Absolute: 0 10*3/uL (ref 0.0–0.1)
Basophils Relative: 0.5 % (ref 0.0–3.0)
Eosinophils Absolute: 0.1 10*3/uL (ref 0.0–0.7)
Eosinophils Relative: 2.6 % (ref 0.0–5.0)
HCT: 34 % — ABNORMAL LOW (ref 36.0–46.0)
Hemoglobin: 10.6 g/dL — ABNORMAL LOW (ref 12.0–15.0)
Lymphocytes Relative: 48.8 % — ABNORMAL HIGH (ref 12.0–46.0)
Lymphs Abs: 2.5 10*3/uL (ref 0.7–4.0)
MCHC: 31.2 g/dL (ref 30.0–36.0)
MCV: 70 fl — ABNORMAL LOW (ref 78.0–100.0)
Monocytes Absolute: 0.4 10*3/uL (ref 0.1–1.0)
Monocytes Relative: 7.9 % (ref 3.0–12.0)
Neutro Abs: 2.1 10*3/uL (ref 1.4–7.7)
Neutrophils Relative %: 40.2 % — ABNORMAL LOW (ref 43.0–77.0)
Platelets: 326 10*3/uL (ref 150.0–400.0)
RBC: 4.85 Mil/uL (ref 3.87–5.11)
RDW: 15.8 % — ABNORMAL HIGH (ref 11.5–15.5)
WBC: 5.2 10*3/uL (ref 4.0–10.5)

## 2021-05-23 LAB — LIPID PANEL
Cholesterol: 132 mg/dL (ref 0–200)
HDL: 50.6 mg/dL (ref >39.00)
LDL Cholesterol: 55 mg/dL (ref 0–99)
NonHDL: 81.04
Total CHOL/HDL Ratio: 3
Triglycerides: 129 mg/dL (ref 0.0–149.0)
VLDL: 25.8 mg/dL (ref 0.0–40.0)

## 2021-05-23 LAB — LIPASE: Lipase: 18 U/L (ref 11.0–59.0)

## 2021-05-23 NOTE — Progress Notes (Signed)
[name] 60 y.o.   Chief Complaint  Patient presents with   Gastroesophageal Reflux    Pt states when she eats food just sits on chest, x 1 month    HISTORY OF PRESENT ILLNESS: This is a 60 y.o. female complaining of feeling of "food sticks in my stomach" that started shortly after her back surgery October 19th. Of opiate medication now for 2 weeks. History of GERD on omeprazole 40 mg twice a day.  Last upper endoscopy about 5 years ago. Chronic smoker but smoking less per patient. Gets occasional nausea but no vomiting.  No hematemesis or melena. No other associated symptoms. No other complaints or medical concerns today.  Gastroesophageal Reflux She complains of heartburn. She reports no abdominal pain, no chest pain, no coughing, no nausea or no sore throat. Pertinent negatives include no melena.    Prior to Admission medications   Medication Sig Start Date End Date Taking? Authorizing Provider  amLODipine (NORVASC) 5 MG tablet TAKE 1 TABLET (5 MG TOTAL) BY MOUTH DAILY. 06/09/20 06/09/21 Yes Horald Pollen, MD  lipase/protease/amylase (CREON) 36000 UNITS CPEP capsule TAKE 3 CAPSULES BY MOUTH WITH EACH MEAL AND 2 CAPSULES WITH A SNACK 08/13/20  Yes Irene Shipper, MD  lisinopril (ZESTRIL) 30 MG tablet Take 1 tablet (30 mg total) by mouth daily. 12/22/20 07/06/21 Yes Malya Cirillo, Ines Bloomer, MD  methocarbamol (ROBAXIN) 500 MG tablet Take 1 tablet (500 mg total) by mouth every 6 (six) hours as needed for muscle spasms. 04/21/21  Yes Marybelle Killings, MD  omeprazole (PRILOSEC) 40 MG capsule Take 1 capsule (40 mg total) by mouth daily. 04/28/21  Yes Talyn Eddie, Ines Bloomer, MD  ondansetron (ZOFRAN) 4 MG tablet Take 1 tablet (4 mg total) by mouth every 6 (six) hours as needed for nausea or vomiting. 04/14/21  Yes Marybelle Killings, MD  rosuvastatin (CRESTOR) 20 MG tablet TAKE 1 TABLET (20 MG TOTAL) BY MOUTH DAILY. 05/12/20 07/06/21 Yes Pricella Gaugh, Ines Bloomer, MD  oxyCODONE-acetaminophen  (PERCOCET/ROXICET) 5-325 MG tablet Take 1 tablet by mouth every 4 (four) hours as needed for severe pain. Patient not taking: Reported on 05/23/2021 04/21/21   Marybelle Killings, MD    Allergies  Allergen Reactions   Aspirin Hives   Hydromorphone Hcl Hives and Nausea And Vomiting   Latex Rash    Patient Active Problem List   Diagnosis Date Noted   History of lumbar laminectomy for spinal cord decompression 04/26/2021   Lumbar stenosis 04/13/2021   Spinal stenosis of lumbar region 03/23/2021   Tension headache 12/22/2020   Low back pain 10/20/2020   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/28/2020   Chronic diarrhea 09/14/2017   H/O TIA (transient ischemic attack) and stroke 06/12/2014   Cervical radiculitis 02/12/2014   Essential hypertension, benign 01/24/2010   Microcytic anemia 01/11/2010   Hyperlipemia 05/28/2007   Current smoker 05/28/2007   GERD 05/28/2007    Past Medical History:  Diagnosis Date   Anemia    Arthritis    "neck, hips" (04/25/2018)   Claustrophobia    Daily headache    GERD (gastroesophageal reflux disease)    Hiatal hernia    Hyperlipidemia    "hx" (04/25/2018)   Hypertension    Plantar fasciitis    hx - left foot   Stroke ([place]) 05/2014   "mini stroke" (04/25/2018)    Past Surgical History:  Procedure Laterality Date   ABDOMINAL HYSTERECTOMY  12/09   Secondary to fibroids   ARTERY BIOPSY Right  04/10/2018   Procedure: BIOPSY TEMPORAL ARTERY;  Surgeon: Rosetta Posner, MD;  Location: [facility];  Service: Vascular;  Laterality: Right;   [place]; 1988   COLONOSCOPY     FOOT SURGERY Left 03/2017   Bone Spurs Removed   LUMBAR LAMINECTOMY/DECOMPRESSION MICRODISCECTOMY N/A 04/13/2021   Procedure: LUMBAR FOUR- LUMBAR FIVE AND LUMBAR FIVE-SACRAL ONE  DECOMPRESSION;  Surgeon: Marybelle Killings, MD;  Location: [facility];  Service: Orthopedics;  Laterality: N/A;   RADIOLOGY WITH ANESTHESIA N/A 04/26/2018   Procedure: MRI WITH ANESTHESIA;  Surgeon:  Radiologist, Medication, MD;  Location: [facility];  Service: Radiology;  Laterality: N/A;   RADIOLOGY WITH ANESTHESIA N/A 08/24/2019   Procedure: MRI WITH ANESTHESIA CERVICAL SPINE;  Surgeon: Radiologist, Medication, MD;  Location: [facility];  Service: Radiology;  Laterality: N/A;   RADIOLOGY WITH ANESTHESIA N/A 10/02/2019   Procedure: MRI WITH ANESTHESIA CERVICAL WITHOUT CONTRAST;  Surgeon: Radiologist, Medication, MD;  Location: [facility];  Service: Radiology;  Laterality: N/A;   TUBAL LIGATION  1988    Social History   Socioeconomic History   Marital status: Divorced    Spouse name: Not on file   Number of children: Not on file   Years of education: Not on file   Highest education level: Not on file  Occupational History   Occupation: unemployed    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  Other Topics Concern   Not on file  Social History Narrative   10 th grade education.   Social Determinants of Health   Financial Resource Strain: Not on file  Food Insecurity: Not on file  Transportation Needs: Not on file  Physical Activity: Not on file  Stress: Not on file  Social Connections: Not on file  Intimate Partner Violence: Not on file    Family History  Problem Relation Age of Onset   Cancer Mother 82       pancreatic cancer   Pancreatic cancer Mother    Heart disease Mother    Cancer Father 74       throat cancer   Esophageal cancer Father    Cancer Maternal Aunt 52       breast cancer   Cancer Other        Died   from Colon cancer in 60's   Colon cancer Maternal Uncle    Colon cancer Paternal Uncle    Heart disease Sister    Rectal cancer Neg Hx    Stomach cancer Neg Hx      Review of Systems  Constitutional: Negative.  Negative for chills and fever.  HENT: Negative.  Negative for congestion and sore throat.   Respiratory: Negative.  Negative for cough and shortness of breath.   Cardiovascular: Negative.  Negative for chest pain and palpitations.  Gastrointestinal:  Positive for heartburn. Negative for abdominal pain, blood in stool, constipation, diarrhea,  melena, nausea and vomiting.  Genitourinary: Negative.  Negative for dysuria and hematuria.  Musculoskeletal: Negative.   Skin: Negative.  Negative for rash.  Neurological:  Negative for dizziness and headaches.  All other systems reviewed and are negative.  Today's Vitals   05/23/21 0757 05/23/21 0820  BP: 140/86 130/70  Pulse: 86   Temp: 98.4 F (36.9 C)   TempSrc: Oral   SpO2: 98%   Weight: 140 lb (63.5 kg)   Height: 4\' 9"  (1.448 m)    Body mass index is 30.3 kg/m.  Physical Exam Vitals reviewed.  Constitutional:      Appearance: Normal appearance.  HENT:     Head: Normocephalic.     Mouth/Throat:     Mouth: Mucous membranes are moist.     Pharynx: Oropharynx is clear.  Eyes:     Extraocular Movements: Extraocular movements intact.     Conjunctiva/sclera: Conjunctivae normal.     Pupils: Pupils are equal, round, and reactive to light.  Cardiovascular:     Rate and Rhythm: Normal rate and regular rhythm.     Pulses: Normal pulses.     Heart sounds: Normal heart sounds.  Pulmonary:     Effort: Pulmonary effort is normal.     Breath sounds: Normal breath sounds.  Abdominal:     General: There is no distension.     Palpations: Abdomen is soft. There is no mass.     Tenderness: There is no abdominal tenderness. There is no guarding.  Musculoskeletal:        General: Normal range of motion.     Cervical back:  Normal range of motion and neck supple. No tenderness.  Skin:    General: Skin is warm and dry.     Capillary Refill: Capillary refill takes less than 2 seconds.  Neurological:     General: No focal deficit present.     Mental Status: She is alert and oriented to person, place, and time.  Psychiatric:        Mood and Affect: Mood normal.        Behavior: Behavior normal.     ASSESSMENT & PLAN: A total of 45 minutes was spent with the patient and counseling/coordination of care regarding preparing for this visit, review of most recent office visit notes, review of most recent blood work results, review of all medications and changes made, differential diagnosis and need for upper endoscopy, education on nutrition and what foods to avoid with GERD, prognosis, documentation, smoking cessation advise, and need for follow-up after GI evaluation.  Problem List Items Addressed This Visit       Cardiovascular and Mediastinum   Essential hypertension, benign (Chronic)    Well-controlled hypertension. BP Readings from Last 3 Encounters:  05/23/21 130/70  04/26/21 111/72  04/14/21 134/87  Continue lisinopril 30 mg and amlodipine 5 mg daily.          Digestive   GERD (Chronic)    Contributing to symptoms.  On omeprazole. Needs upper endoscopy. Smoker.  Needs to have malignancy ruled out.      Chronic diarrhea (Chronic)    Believed to be secondary to pancreatic insufficiency.  Presently on Creon.        Other   Hyperlipemia (Chronic)    Stable.  Lipid profile done today.  Continue rosuvastatin 20 mg daily. The 10-year ASCVD risk score (Arnett DK, et al., 2019) is: 15.3%   Values used to calculate the score:     Age: 61 years  Sex: Female     Is Non-Hispanic African American: Yes     Diabetic: No     Tobacco smoker: Yes     Systolic Blood Pressure: 962 mmHg     Is BP treated: Yes     HDL Cholesterol: 51 mg/dL     Total Cholesterol: 230 mg/dL       Current smoker     Cardiovascular and cancer risks associated with smoking discussed. Smoking cessation advice given. Patient states she is smoking a lot less.      Low back pain    Much improved after surgery.  Off pain medications now.      Dyspepsia - Primary    History of GERD.  Recent opiates.  On omeprazole.  Smoker. Differential diagnosis including malignancy discussed with patient. Needs GI referral for upper endoscopy.  Patient sees Dr. Henrene Pastor, GI doctor. Will follow-up after GI evaluation.      Relevant Orders   CBC with Differential/Platelet   Comprehensive metabolic panel   Lipid panel   Lipase   History of gastroesophageal reflux (GERD)   Patient Instructions  Gastroesophageal Reflux Disease, Adult Gastroesophageal reflux (GER) happens when acid from the stomach flows up into the tube that connects the mouth and the stomach (esophagus). Normally, food travels down the esophagus and stays in the stomach to be digested. With GER, food and stomach acid sometimes move back up into the esophagus. You may have a disease called gastroesophageal reflux disease (GERD) if the reflux: Happens often. Causes frequent or very bad symptoms. Causes problems such as damage to the esophagus. When this happens, the esophagus becomes sore and swollen. Over time, GERD can make small holes (ulcers) in the lining of the esophagus. What are the causes? This condition is caused by a problem with the muscle between the esophagus and the stomach. When this muscle is weak or not normal, it does not close properly to keep food and acid from coming back up from the stomach. The muscle can be weak because of: Tobacco use. Pregnancy. Having a certain type of hernia (hiatal hernia). Alcohol use. Certain foods and drinks, such as coffee, chocolate, onions, and peppermint. What increases the risk? Being overweight. Having a disease that affects your connective tissue. Taking NSAIDs, such a ibuprofen. What are the  signs or symptoms? Heartburn. Difficult or painful swallowing. The feeling of having a lump in the throat. A bitter taste in the mouth. Bad breath. Having a lot of saliva. Having an upset or bloated stomach. Burping. Chest pain. Different conditions can cause chest pain. Make sure you see your doctor if you have chest pain. Shortness of breath or wheezing. A long-term cough or a cough at night. Wearing away of the surface of teeth (tooth enamel). Weight loss. How is this treated? Making changes to your diet. Taking medicine. Having surgery. Treatment will depend on how bad your symptoms are. Follow these instructions at home: Eating and drinking  Follow a diet as told by your doctor. You may need to avoid foods and drinks such as: Coffee and tea, with or without caffeine. Drinks that contain alcohol. Energy drinks and sports drinks. Bubbly (carbonated) drinks or sodas. Chocolate and cocoa. Peppermint and mint flavorings. Garlic and onions. Horseradish. Spicy and acidic foods. These include peppers, chili powder, curry powder, vinegar, hot sauces, and BBQ sauce. Citrus fruit juices and citrus fruits, such as oranges, lemons, and limes. Tomato-based foods. These include red sauce, chili, salsa, and pizza with red sauce. [place]  and fatty foods. These include donuts, french fries, potato chips, and high-fat dressings. High-fat meats. These include hot dogs, rib eye steak, sausage, ham, and bacon. High-fat dairy items, such as whole milk, butter, and cream cheese. Eat small meals often. Avoid eating large meals. Avoid drinking large amounts of liquid with your meals. Avoid eating meals during the 2-3 hours before bedtime. Avoid lying down right after you eat. Do not exercise right after you eat. Lifestyle  Do not smoke or use any products that contain nicotine or tobacco. If you need help quitting, ask your doctor. Try to lower your stress. If you need help doing this, ask your  doctor. If you are overweight, lose an amount of weight that is healthy for you. Ask your doctor about a safe weight loss goal. General instructions Pay attention to any changes in your symptoms. Take over-the-counter and prescription medicines only as told by your doctor. Do not take aspirin, ibuprofen, or other NSAIDs unless your doctor says it is okay. Wear loose clothes. Do not wear anything tight around your waist. Raise (elevate) the head of your bed about 6 inches (15 cm). You may need to use a wedge to do this. Avoid bending over if this makes your symptoms worse. Keep all follow-up visits. Contact a doctor if: You have new symptoms. You lose weight and you do not know why. You have trouble swallowing or it hurts to swallow. You have wheezing or a cough that keeps happening. You have a hoarse voice. Your symptoms do not get better with treatment. Get help right away if: You have sudden pain in your arms, neck, jaw, teeth, or back. You suddenly feel sweaty, dizzy, or light-headed. You have chest pain or shortness of breath. You vomit and the vomit is green, yellow, or black, or it looks like blood or coffee grounds. You faint. Your poop (stool) is red, bloody, or black. You cannot swallow, drink, or eat. These symptoms may represent a serious problem that is an emergency. Do not wait to see if the symptoms will go away. Get medical help right away. Call your local emergency services (911 in the [place] Do not drive yourself to the hospital. Summary If a person has gastroesophageal reflux disease (GERD), food and stomach acid move back up into the esophagus and cause symptoms or problems such as damage to the esophagus. Treatment will depend on how bad your symptoms are. Follow a diet as told by your doctor. Take all medicines only as told by your doctor. This information is not intended to replace advice given to you by your health care provider. Make sure you discuss any  questions you have with your health care provider. Document Revised: 12/22/2019 Document Reviewed: 12/22/2019 Elsevier Patient Education  2022 [place], MD [facility]

## 2021-05-23 NOTE — Assessment & Plan Note (Signed)
History of GERD.  Recent opiates.  On omeprazole.  Smoker. Differential diagnosis including malignancy discussed with patient. Needs GI referral for upper endoscopy.  Patient sees Dr. Henrene Pastor, GI doctor. Will follow-up after GI evaluation.

## 2021-05-23 NOTE — Assessment & Plan Note (Signed)
Cardiovascular and cancer risks associated with smoking discussed. Smoking cessation advice given. Patient states she is smoking a lot less.

## 2021-05-23 NOTE — Assessment & Plan Note (Signed)
Stable.  Lipid profile done today.  Continue rosuvastatin 20 mg daily. The 10-year ASCVD risk score (Arnett DK, et al., 2019) is: 15.3%   Values used to calculate the score:     Age: 60 years     Sex: Female     Is Non-Hispanic African American: Yes     Diabetic: No     Tobacco smoker: Yes     Systolic Blood Pressure: 375 mmHg     Is BP treated: Yes     HDL Cholesterol: 51 mg/dL     Total Cholesterol: 230 mg/dL

## 2021-05-23 NOTE — Patient Instructions (Signed)
Gastroesophageal Reflux Disease, Adult Gastroesophageal reflux (GER) happens when acid from the stomach flows up into the tube that connects the mouth and the stomach (esophagus). Normally, food travels down the esophagus and stays in the stomach to be digested. With GER, food and stomach acid sometimes move back up into the esophagus. You may have a disease called gastroesophageal reflux disease (GERD) if the reflux: Happens often. Causes frequent or very bad symptoms. Causes problems such as damage to the esophagus. When this happens, the esophagus becomes sore and swollen. Over time, GERD can make small holes (ulcers) in the lining of the esophagus. What are the causes? This condition is caused by a problem with the muscle between the esophagus and the stomach. When this muscle is weak or not normal, it does not close properly to keep food and acid from coming back up from the stomach. The muscle can be weak because of: Tobacco use. Pregnancy. Having a certain type of hernia (hiatal hernia). Alcohol use. Certain foods and drinks, such as coffee, chocolate, onions, and peppermint. What increases the risk? Being overweight. Having a disease that affects your connective tissue. Taking NSAIDs, such a ibuprofen. What are the signs or symptoms? Heartburn. Difficult or painful swallowing. The feeling of having a lump in the throat. A bitter taste in the mouth. Bad breath. Having a lot of saliva. Having an upset or bloated stomach. Burping. Chest pain. Different conditions can cause chest pain. Make sure you see your doctor if you have chest pain. Shortness of breath or wheezing. A long-term cough or a cough at night. Wearing away of the surface of teeth (tooth enamel). Weight loss. How is this treated? Making changes to your diet. Taking medicine. Having surgery. Treatment will depend on how bad your symptoms are. Follow these instructions at home: Eating and drinking  Follow a  diet as told by your doctor. You may need to avoid foods and drinks such as: Coffee and tea, with or without caffeine. Drinks that contain alcohol. Energy drinks and sports drinks. Bubbly (carbonated) drinks or sodas. Chocolate and cocoa. Peppermint and mint flavorings. Garlic and onions. Horseradish. Spicy and acidic foods. These include peppers, chili powder, curry powder, vinegar, hot sauces, and BBQ sauce. Citrus fruit juices and citrus fruits, such as oranges, lemons, and limes. Tomato-based foods. These include red sauce, chili, salsa, and pizza with red sauce. Fried and fatty foods. These include donuts, french fries, potato chips, and high-fat dressings. High-fat meats. These include hot dogs, rib eye steak, sausage, ham, and bacon. High-fat dairy items, such as whole milk, butter, and cream cheese. Eat small meals often. Avoid eating large meals. Avoid drinking large amounts of liquid with your meals. Avoid eating meals during the 2-3 hours before bedtime. Avoid lying down right after you eat. Do not exercise right after you eat. Lifestyle  Do not smoke or use any products that contain nicotine or tobacco. If you need help quitting, ask your doctor. Try to lower your stress. If you need help doing this, ask your doctor. If you are overweight, lose an amount of weight that is healthy for you. Ask your doctor about a safe weight loss goal. General instructions Pay attention to any changes in your symptoms. Take over-the-counter and prescription medicines only as told by your doctor. Do not take aspirin, ibuprofen, or other NSAIDs unless your doctor says it is okay. Wear loose clothes. Do not wear anything tight around your waist. Raise (elevate) the head of your bed about 6  inches (15 cm). You may need to use a wedge to do this. Avoid bending over if this makes your symptoms worse. Keep all follow-up visits. Contact a doctor if: You have new symptoms. You lose weight and you  do not know why. You have trouble swallowing or it hurts to swallow. You have wheezing or a cough that keeps happening. You have a hoarse voice. Your symptoms do not get better with treatment. Get help right away if: You have sudden pain in your arms, neck, jaw, teeth, or back. You suddenly feel sweaty, dizzy, or light-headed. You have chest pain or shortness of breath. You vomit and the vomit is green, yellow, or black, or it looks like blood or coffee grounds. You faint. Your poop (stool) is red, bloody, or black. You cannot swallow, drink, or eat. These symptoms may represent a serious problem that is an emergency. Do not wait to see if the symptoms will go away. Get medical help right away. Call your local emergency services (911 in the [place] Do not drive yourself to the hospital. Summary If a person has gastroesophageal reflux disease (GERD), food and stomach acid move back up into the esophagus and cause symptoms or problems such as damage to the esophagus. Treatment will depend on how bad your symptoms are. Follow a diet as told by your doctor. Take all medicines only as told by your doctor. This information is not intended to replace advice given to you by your health care provider. Make sure you discuss any questions you have with your health care provider. Document Revised: 12/22/2019 Document Reviewed: 12/22/2019 Elsevier Patient Education  [place].

## 2021-05-23 NOTE — Assessment & Plan Note (Signed)
Contributing to symptoms.  On omeprazole. Needs upper endoscopy. Smoker.  Needs to have malignancy ruled out.

## 2021-05-23 NOTE — Assessment & Plan Note (Signed)
Well-controlled hypertension. BP Readings from Last 3 Encounters:  05/23/21 130/70  04/26/21 111/72  04/14/21 134/87  Continue lisinopril 30 mg and amlodipine 5 mg daily.

## 2021-05-23 NOTE — Assessment & Plan Note (Signed)
Believed to be secondary to pancreatic insufficiency.  Presently on Creon.

## 2021-05-23 NOTE — Assessment & Plan Note (Signed)
Much improved after surgery.  Off pain medications now.

## 2021-05-24 ENCOUNTER — Ambulatory Visit (INDEPENDENT_AMBULATORY_CARE_PROVIDER_SITE_OTHER): Payer: 59 | Admitting: Orthopaedic Surgery

## 2021-05-24 ENCOUNTER — Encounter: Payer: Self-pay | Admitting: Orthopaedic Surgery

## 2021-05-24 ENCOUNTER — Other Ambulatory Visit (HOSPITAL_COMMUNITY): Payer: Self-pay

## 2021-05-24 ENCOUNTER — Other Ambulatory Visit: Payer: Self-pay

## 2021-05-24 VITALS — BP 168/100 | Ht <= 58 in | Wt 140.0 lb

## 2021-05-24 DIAGNOSIS — Z9889 Other specified postprocedural states: Secondary | ICD-10-CM

## 2021-05-24 MED ORDER — METHOCARBAMOL 500 MG PO TABS
500.0000 mg | ORAL_TABLET | Freq: Four times a day (QID) | ORAL | 0 refills | Status: DC | PRN
  Filled 2021-05-24: qty 50, 13d supply, fill #0

## 2021-05-24 NOTE — Progress Notes (Signed)
Post-Op Visit Note   Patient: [name]           Date of Birth: 07/19/1960           MRN: [id number] Visit Date: 05/24/2021 PCP: Horald Pollen, MD   Assessment & Plan: Postop two-level lumbar decompression she is walked to the store a couple times which is 2 and half blocks.  Good relief of preop neurogenic claudication symptoms.  She works in housekeeping at [employer] and requested a work note to resume work on 06/06/2021.  She also requested final refill of Robaxin which was sent in.  Return as needed she is very happy the surgery result.  Chief Complaint:  Chief Complaint  Patient presents with   Lower Back - Follow-up    04/13/2021 L4-5,L5-S1 deompression   Visit Diagnoses:  1. History of lumbar laminectomy for spinal cord decompression   2. Status post lumbar spine surgery for decompression of spinal cord     Plan: as above.   Follow-Up Instructions: Return if symptoms worsen or fail to improve.   Orders:  No orders of the defined types were placed in this encounter.  Meds ordered this encounter  Medications   methocarbamol (ROBAXIN) 500 MG tablet    Sig: Take 1 tablet (500 mg total) by mouth every 6 (six) hours as needed for muscle spasms.    Dispense:  50 tablet    Refill:  0    Imaging: No results found.  PMFS History: Patient Active Problem List   Diagnosis Date Noted   Status post lumbar spine surgery for decompression of spinal cord 05/24/2021   Dyspepsia 05/23/2021   History of gastroesophageal reflux (GERD) 05/23/2021   History of lumbar laminectomy for spinal cord decompression 04/26/2021   Lumbar stenosis 04/13/2021   Tension headache 12/22/2020   Low back pain 10/20/2020   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/28/2020   Chronic diarrhea 09/14/2017   H/O TIA (transient ischemic attack) and stroke 06/12/2014   Cervical radiculitis 02/12/2014   Essential hypertension, benign 01/24/2010   Microcytic anemia 01/11/2010    Hyperlipemia 05/28/2007   Current smoker 05/28/2007   GERD 05/28/2007   Past Medical History:  Diagnosis Date   Anemia    Arthritis    "neck, hips" (04/25/2018)   Claustrophobia    Daily headache    GERD (gastroesophageal reflux disease)    Hiatal hernia    Hyperlipidemia    "hx" (04/25/2018)   Hypertension    Plantar fasciitis    hx - left foot   Stroke ([place]) 05/2014   "mini stroke" (04/25/2018)    Family History  Problem Relation Age of Onset   Cancer Mother 57       pancreatic cancer   Pancreatic cancer Mother    Heart disease Mother    Cancer Father 71       throat cancer   Esophageal cancer Father    Cancer Maternal Aunt 60       breast cancer   Cancer Other        Died  from Colon cancer in 60's   Colon cancer Maternal Uncle    Colon cancer Paternal Uncle    Heart disease Sister    Rectal cancer Neg Hx    Stomach cancer Neg Hx     Past Surgical History:  Procedure Laterality Date   ABDOMINAL HYSTERECTOMY  12/09   Secondary to fibroids   ARTERY BIOPSY Right 04/10/2018   Procedure: BIOPSY  TEMPORAL ARTERY;  Surgeon: Rosetta Posner, MD;  Location: [facility];  Service: Vascular;  Laterality: Right;   [place]; 1988   COLONOSCOPY     FOOT SURGERY Left 03/2017   Bone Spurs Removed   LUMBAR LAMINECTOMY/DECOMPRESSION MICRODISCECTOMY N/A 04/13/2021   Procedure: LUMBAR FOUR- LUMBAR FIVE AND LUMBAR FIVE-SACRAL ONE  DECOMPRESSION;  Surgeon: Marybelle Killings, MD;  Location: [facility];  Service: Orthopedics;  Laterality: N/A;   RADIOLOGY WITH ANESTHESIA N/A 04/26/2018   Procedure: MRI WITH ANESTHESIA;  Surgeon: Radiologist, Medication, MD;  Location: [facility];  Service: Radiology;  Laterality: N/A;   RADIOLOGY WITH ANESTHESIA N/A 08/24/2019   Procedure: MRI WITH ANESTHESIA CERVICAL SPINE;  Surgeon: Radiologist, Medication, MD;  Location: [facility];  Service: Radiology;  Laterality: N/A;   RADIOLOGY WITH ANESTHESIA N/A 10/02/2019   Procedure: MRI WITH ANESTHESIA CERVICAL  WITHOUT CONTRAST;  Surgeon: Radiologist, Medication, MD;  Location: [facility];  Service: Radiology;  Laterality: N/A;   TUBAL LIGATION  1988   Social History   Occupational History   Occupation: unemployed    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

## 2021-05-26 ENCOUNTER — Other Ambulatory Visit (HOSPITAL_COMMUNITY): Payer: Self-pay

## 2021-05-30 ENCOUNTER — Other Ambulatory Visit (HOSPITAL_COMMUNITY): Payer: Self-pay

## 2021-06-02 ENCOUNTER — Ambulatory Visit: Payer: 59 | Admitting: Nurse Practitioner

## 2021-06-06 ENCOUNTER — Telehealth: Payer: Self-pay | Admitting: Orthopaedic Surgery

## 2021-06-06 NOTE — Telephone Encounter (Signed)
Please advise 

## 2021-06-06 NOTE — Telephone Encounter (Signed)
Pt called requesting a call back from [place]. Pt is asking for Gwinda Passe to ask Dr. Lorin Mercy what to do about her back. Pt states her first day back to work is today and she is experiencing a lot of pulling and burning. Please call pt with medical advice at [phone].

## 2021-06-06 NOTE — Telephone Encounter (Signed)
Patient called advised her employer agreed that she will work half days from 7:00am to 1:00pm. Patient said her employer need a letter stating that she will work this schedule for 2 weeks. Patient said she will pick up the note tomorrow. The number to contact patient is [phone]

## 2021-06-06 NOTE — Telephone Encounter (Signed)
noted 

## 2021-06-06 NOTE — Telephone Encounter (Signed)
[place] for restrictions for two weeks?

## 2021-06-07 NOTE — Telephone Encounter (Signed)
Letter entered and printed.

## 2021-06-07 NOTE — Telephone Encounter (Signed)
Note at front for pick up.

## 2021-06-09 ENCOUNTER — Other Ambulatory Visit: Payer: Self-pay | Admitting: Emergency Medicine

## 2021-06-09 ENCOUNTER — Telehealth: Payer: Self-pay | Admitting: Emergency Medicine

## 2021-06-09 ENCOUNTER — Other Ambulatory Visit (HOSPITAL_COMMUNITY): Payer: Self-pay

## 2021-06-09 MED ORDER — DICLOFENAC SODIUM 3 % EX GEL
1.0000 "application " | Freq: Three times a day (TID) | CUTANEOUS | 1 refills | Status: DC | PRN
  Filled 2021-06-09: qty 200, 33d supply, fill #0

## 2021-06-09 NOTE — Telephone Encounter (Signed)
Pt requesting provider to call in Ibuprofen for arthritis. States it is bad today, as it is bad.   Also, requesting some type of cream for arthritis. Did not know name.    Callback #- [phone]

## 2021-06-09 NOTE — Telephone Encounter (Signed)
Prescription for diclofenac gel sent to pharmacy of record.

## 2021-06-09 NOTE — Telephone Encounter (Signed)
Patient calling to check status of request  *see below*

## 2021-06-09 NOTE — Telephone Encounter (Signed)
Patient states she is allergic to aspirin so she should not be taking ibuprofen.

## 2021-06-10 ENCOUNTER — Other Ambulatory Visit: Payer: Self-pay | Admitting: Emergency Medicine

## 2021-06-10 ENCOUNTER — Other Ambulatory Visit (HOSPITAL_COMMUNITY): Payer: Self-pay

## 2021-06-10 DIAGNOSIS — I10 Essential (primary) hypertension: Secondary | ICD-10-CM

## 2021-06-10 MED ORDER — AMLODIPINE BESYLATE 5 MG PO TABS
5.0000 mg | ORAL_TABLET | Freq: Every day | ORAL | 3 refills | Status: DC
  Filled 2021-06-10: qty 90, 90d supply, fill #0
  Filled 2021-09-23: qty 90, 90d supply, fill #1
  Filled 2021-12-13: qty 90, 90d supply, fill #2
  Filled 2022-03-01: qty 30, 30d supply, fill #3
  Filled 2022-06-05: qty 30, 30d supply, fill #4

## 2021-06-10 MED FILL — Pancrelipase (Lip-Prot-Amyl) DR Cap 36000-114000-180000 Unit: ORAL | 30 days supply | Qty: 330 | Fill #2 | Status: AC

## 2021-06-10 NOTE — Telephone Encounter (Signed)
Patient called in reference to a prescription that was filled for her but she can not afford the medication wants to know if there is a different  medications she can take. Diclofenac was $2000.00. Patient left a call back number [phone]

## 2021-06-13 ENCOUNTER — Other Ambulatory Visit (HOSPITAL_COMMUNITY): Payer: Self-pay

## 2021-06-13 NOTE — Telephone Encounter (Signed)
Patient states she is not allergic to ibuprofen  Patient states she has taken ibuprofen in the past w/ no allergic outbreak  Patient states diclofenac gel is too expensive and requesting another affordable alternative cream or gel  Please advise *see below*

## 2021-06-14 ENCOUNTER — Other Ambulatory Visit: Payer: Self-pay

## 2021-06-14 ENCOUNTER — Ambulatory Visit (INDEPENDENT_AMBULATORY_CARE_PROVIDER_SITE_OTHER): Payer: 59 | Admitting: Orthopaedic Surgery

## 2021-06-14 ENCOUNTER — Encounter: Payer: Self-pay | Admitting: Orthopaedic Surgery

## 2021-06-14 ENCOUNTER — Other Ambulatory Visit (HOSPITAL_COMMUNITY): Payer: Self-pay

## 2021-06-14 VITALS — BP 132/80 | HR 85 | Ht <= 58 in | Wt 140.0 lb

## 2021-06-14 DIAGNOSIS — Z9889 Other specified postprocedural states: Secondary | ICD-10-CM

## 2021-06-14 NOTE — Progress Notes (Signed)
Post-Op Visit Note   Patient: [name]           Date of Birth: [birth year]           MRN: [id number] Visit Date: 06/14/2021 PCP: Horald Pollen, MD   Assessment & Plan: At follow-up two-level decompression.  Patient states she started doing some turning twisting mopping and sweeping and vacuuming at work and had increase in her back symptoms.  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  She has had history of a TIA in the past and has to watch her blood pressure carefully.  We discussed turning repetitive twisting type activities are likely to bother her back.  Her lumbar spine has been decompressed she has gotten relief of the neurogenic claudication symptoms but does have the facet arthritis.  We discussed extensively with her the only way to get rid of that arthritis problem is diffuse the back but will load other levels which are then likely to wear at an accelerated rate.  She will try to look for something where she does have to do is much lumbar rotation. She discussed that she wished she had listened and not gone back to turning twisting activities as I had recommended but she stated she needed to get back to work due to car payments etc.  Work slip given light duty no vacuuming no mopping x1 month.  Recheck 6 weeks.  Chief Complaint:  Chief Complaint  Patient presents with   Lower Back - Pain    04/13/2021 L4-5, L5-S1 decompression   Visit Diagnoses:  1. History of lumbar laminectomy for spinal cord decompression     Plan: Work slip given light duty avoid vacuuming and mopping x1 month.  Recheck 6 weeks.  Follow-Up Instructions: Return in about 6 weeks (around 07/26/2021).   Orders:  No orders of the defined types were placed in this encounter.  No orders of the defined types were placed in this encounter.   Imaging: No results found.  PMFS History: Patient  Active Problem List   Diagnosis Date Noted   Status post lumbar spine surgery for decompression of spinal cord 05/24/2021   Dyspepsia 05/23/2021   History of gastroesophageal reflux (GERD) 05/23/2021   History of lumbar laminectomy for spinal cord decompression 04/26/2021   Lumbar stenosis 04/13/2021   Tension headache 12/22/2020   Low back pain 10/20/2020   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/28/2020   Chronic diarrhea 09/14/2017   H/O TIA (transient ischemic attack) and stroke 06/12/2014   Cervical radiculitis 02/12/2014   Essential hypertension, benign 01/24/2010   Microcytic anemia 01/11/2010   Hyperlipemia 05/28/2007   Current smoker 05/28/2007   GERD 05/28/2007   Past Medical History:  Diagnosis Date   Anemia    Arthritis    "neck, hips" (04/25/2018)   Claustrophobia    Daily headache    GERD (gastroesophageal reflux disease)    Hiatal hernia    Hyperlipidemia    "hx" (04/25/2018)   Hypertension    Plantar fasciitis    hx - left foot   Stroke ([place]) 05/2014   "mini stroke" (04/25/2018)    Family History  Problem Relation Age of Onset   Cancer Mother 49       pancreatic cancer   Pancreatic cancer Mother    Heart disease Mother    Cancer Father 37  throat cancer   Esophageal cancer Father    Cancer Maternal Aunt 71       breast cancer   Cancer Other        Died  from Colon cancer in 60's   Colon cancer Maternal Uncle    Colon cancer Paternal Uncle    Heart disease Sister    Rectal cancer Neg Hx    Stomach cancer Neg Hx     Past Surgical History:  Procedure Laterality Date   ABDOMINAL HYSTERECTOMY  12/09   Secondary to fibroids   ARTERY BIOPSY Right 04/10/2018   Procedure: BIOPSY TEMPORAL ARTERY;  Surgeon: Rosetta Posner, MD;  [facility];  Service: Vascular;  Laterality: Right;   [place]; 1988   COLONOSCOPY     FOOT SURGERY Left 03/2017   Bone Spurs Removed   LUMBAR LAMINECTOMY/DECOMPRESSION MICRODISCECTOMY N/A 04/13/2021    Procedure: LUMBAR FOUR- LUMBAR FIVE AND LUMBAR FIVE-SACRAL ONE  DECOMPRESSION;  Surgeon: Marybelle Killings, MD;  Location: [facility];  Service: Orthopedics;  Laterality: N/A;   RADIOLOGY WITH ANESTHESIA N/A 04/26/2018   Procedure: MRI WITH ANESTHESIA;  Surgeon: Radiologist, Medication, MD;  Location: [facility];  Service: Radiology;  Laterality: N/A;   RADIOLOGY WITH ANESTHESIA N/A 08/24/2019   Procedure: MRI WITH ANESTHESIA CERVICAL SPINE;  Surgeon: Radiologist, Medication, MD;  Location: [facility];  Service: Radiology;  Laterality: N/A;   RADIOLOGY WITH ANESTHESIA N/A 10/02/2019   Procedure: MRI WITH ANESTHESIA CERVICAL WITHOUT CONTRAST;  Surgeon: Radiologist, Medication, MD;  Location: [facility];  Service: Radiology;  Laterality: N/A;   TUBAL LIGATION  1988   Social History   Occupational History   Occupation: unemployed    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

## 2021-06-14 NOTE — Telephone Encounter (Signed)
Patient checking status of medication recommendation  Advised the patient of providers recommendation  Patient understood acknowledged and understood recommendation

## 2021-06-14 NOTE — Telephone Encounter (Signed)
She should try over-the-counter Voltaren gel.

## 2021-06-15 ENCOUNTER — Other Ambulatory Visit (HOSPITAL_COMMUNITY): Payer: Self-pay

## 2021-06-16 ENCOUNTER — Telehealth: Payer: Self-pay | Admitting: Orthopaedic Surgery

## 2021-06-16 NOTE — Telephone Encounter (Signed)
Patient called advised the Aleve is not working. Patient asked if Dr. Lorin Mercy can call in Prednisone for her. Patient uses [facility]. The number to contact patient is [phone]

## 2021-06-16 NOTE — Telephone Encounter (Signed)
Please advise 

## 2021-06-17 ENCOUNTER — Other Ambulatory Visit (HOSPITAL_COMMUNITY): Payer: Self-pay

## 2021-06-21 ENCOUNTER — Other Ambulatory Visit (HOSPITAL_COMMUNITY): Payer: Self-pay

## 2021-07-07 ENCOUNTER — Other Ambulatory Visit (INDEPENDENT_AMBULATORY_CARE_PROVIDER_SITE_OTHER): Payer: 59

## 2021-07-07 ENCOUNTER — Other Ambulatory Visit (HOSPITAL_COMMUNITY): Payer: Self-pay

## 2021-07-07 ENCOUNTER — Ambulatory Visit (INDEPENDENT_AMBULATORY_CARE_PROVIDER_SITE_OTHER): Payer: 59 | Admitting: Nurse Practitioner

## 2021-07-07 ENCOUNTER — Encounter: Payer: Self-pay | Admitting: Nurse Practitioner

## 2021-07-07 VITALS — BP 124/80 | HR 86 | Ht <= 58 in | Wt 140.0 lb

## 2021-07-07 DIAGNOSIS — R7989 Other specified abnormal findings of blood chemistry: Secondary | ICD-10-CM | POA: Diagnosis not present

## 2021-07-07 DIAGNOSIS — K219 Gastro-esophageal reflux disease without esophagitis: Secondary | ICD-10-CM | POA: Diagnosis not present

## 2021-07-07 DIAGNOSIS — R110 Nausea: Secondary | ICD-10-CM

## 2021-07-07 LAB — CBC
HCT: 32.6 % — ABNORMAL LOW (ref 36.0–46.0)
Hemoglobin: 10.1 g/dL — ABNORMAL LOW (ref 12.0–15.0)
MCHC: 30.9 g/dL (ref 30.0–36.0)
MCV: 68.6 fl — ABNORMAL LOW (ref 78.0–100.0)
Platelets: 247 10*3/uL (ref 150.0–400.0)
RBC: 4.75 Mil/uL (ref 3.87–5.11)
RDW: 15.6 % — ABNORMAL HIGH (ref 11.5–15.5)
WBC: 6.1 10*3/uL (ref 4.0–10.5)

## 2021-07-07 LAB — COMPREHENSIVE METABOLIC PANEL
ALT: 65 U/L — ABNORMAL HIGH (ref 0–35)
AST: 54 U/L — ABNORMAL HIGH (ref 0–37)
Albumin: 4 g/dL (ref 3.5–5.2)
Alkaline Phosphatase: 81 U/L (ref 39–117)
BUN: 17 mg/dL (ref 6–23)
CO2: 26 mEq/L (ref 19–32)
Calcium: 8.5 mg/dL (ref 8.4–10.5)
Chloride: 108 mEq/L (ref 96–112)
Creatinine, Ser: 0.86 mg/dL (ref 0.40–1.20)
GFR: 73.75 mL/min (ref >60.00)
Glucose, Bld: 122 mg/dL — ABNORMAL HIGH (ref 70–99)
Potassium: 3.5 mEq/L (ref 3.5–5.1)
Sodium: 141 mEq/L (ref 135–145)
Total Bilirubin: 0.2 mg/dL (ref 0.2–1.2)
Total Protein: 6.7 g/dL (ref 6.0–8.3)

## 2021-07-07 LAB — LIPASE: Lipase: 31 U/L (ref 11.0–59.0)

## 2021-07-07 LAB — GAMMA GT: GGT: 66 U/L — ABNORMAL HIGH (ref 7–51)

## 2021-07-07 MED ORDER — PANTOPRAZOLE SODIUM 40 MG PO TBEC
40.0000 mg | DELAYED_RELEASE_TABLET | Freq: Two times a day (BID) | ORAL | 1 refills | Status: DC
  Filled 2021-07-07: qty 60, 30d supply, fill #0
  Filled 2021-07-07: qty 30, 30d supply, fill #0
  Filled 2021-07-07: qty 90, 90d supply, fill #0
  Filled 2021-09-23: qty 30, 30d supply, fill #1

## 2021-07-07 NOTE — Progress Notes (Signed)
Assessment and plan reviewed 

## 2021-07-07 NOTE — Progress Notes (Signed)
07/07/2021 [name] [id number] 1960/09/15   Chief Complaint: Worsening reflux   History of Present Illness: [name] is a 61 y.o. female with pancreatic insufficiency by history of hypertension, anemia, GERD, chronic diarrhea with pancreatic insufficiency on Creon. S/P hysterectomy 2009 due to uterine fibroids and lumbar decompression 03/2021. She is followed by Dr. Henrene Pastor.  She presents to our office today as referred by Dr. Ines Bloomer Sagardia for further evaluation regarding worsening reflux symptoms, consider scheduling an EGD to evaluate elevated LFTs.  She was previously taking Omeprazole 20 mg once daily.  Her reflux symptoms have progressively worsened over the past 6 weeks.  She is now taking Omeprazole 20 mg twice daily for the past few weeks without significant improvement.  She has significant epigastric burning which radiates up into the esophagus.  No dysphagia.  She has intermittent nausea without vomiting.  She wishes to schedule a repeat EGD.  She remains on Creon without further diarrhea, abdominal bloat or weight loss.  She is passing normal soft brown bowel movement most days.  No rectal bleeding or black stools.  She describes having a vague right and left abdominal pain near her hips which is worse when she walks for the past 2 weeks.  She takes Aleve 2 tabs daily for back pain.  She drinks 3 beers weekly.  She underwent an EGD 11/11/2019 which was normal.  A colonoscopy was done on the same date which identified a 3 mm sessile serrated polyp which was removed from the transverse colon.  Random colon biopsies were negative for colitis.  No known family history of IBD or colon cancer. Mother with history of pancreatic cancer.  Her most recent laboratory studies 05/23/2021 showed stable microcytic anemia with a hemoglobin level of 10.6.  Elevated LFTs with AST level 68.   Remote history of menorrhagia secondary to uterine fibroids.  She underwent a hysterectomy in  2009.  Her microcytic anemia persisted.  She underwent an EGD and colonoscopy 10/2019, findings did not explain her IDA.  CBC Latest Ref Rng & Units 05/23/2021 04/07/2021 12/22/2020  WBC 4.0 - 10.5 K/uL 5.2 6.7 6.6  Hemoglobin 12.0 - 15.0 g/dL 10.6(L) 10.8(L) 10.2(L)  Hematocrit 36.0 - 46.0 % 34.0(L) 35.1(L) 32.3(L)  Platelets 150.0 - 400.0 K/uL 326.0 280 239.0  Iron level 31 and B12 level 241 on 10/10/2019 (she was prescribed ferrous sulfate 325 mg and OTC B12 supplement at that time which she is no longer taking)  CMP Latest Ref Rng & Units 05/23/2021 04/07/2021 12/22/2020  Glucose 70 - 99 mg/dL 86 98 106(H)  BUN 6 - 23 mg/dL 11 13 18   Creatinine 0.40 - 1.20 mg/dL 0.55 0.62 0.79  Sodium 135 - 145 mEq/L 144 143 140  Potassium 3.5 - 5.1 mEq/L 3.8 3.2(L) 3.6  Chloride 96 - 112 mEq/L 106 106 106  CO2 19 - 32 mEq/L 28 27 26   Calcium 8.4 - 10.5 mg/dL 8.9 8.9 9.0  Total Protein 6.0 - 8.3 g/dL 7.3 - 7.1  Total Bilirubin 0.2 - 1.2 mg/dL 0.3 - 0.2  Alkaline Phos 39 - 117 U/L 86 - 74  AST 0 - 37 U/L 68(H) - 31  ALT 0 - 35 U/L 83(H) - 51(H)  Lipase 18.  EGD 11/11/2019 done due to weight loss, diarrhea and microcytic anemia: - The esophagus was normal. - The stomach was normal. - The examined duodenum was normal. - The cardia and gastric fundus were normal on retroflexion.  Colonoscopy  5/18/202 done due to weight loss, diarrhea and microcytic anemia: - The examined portion of the ileum was normal. - One 3 mm polyp in the transverse colon, removed with a cold snare. Resected and retrieved. - Small hemorrhoids. Prominent hypertrophic anal papilla - The entire examined colon is otherwise normal on direct and retroflexion views. Status post biopsies. - 7 year colonoscopy recall 1. Surgical [P], random colon sites - COLONIC MUCOSA WITH NO SIGNIFICANT PATHOLOGIC FINDINGS. - NEGATIVE FOR ACTIVE INFLAMMATION AND OTHER ABNORMALITIES. 2. Surgical [P], colon, transverse, polyp - SESSILE SERRATED POLYP  WITHOUT CYTOLOGIC DYSPLASIA  Colonoscopy 04/10/2014: Normal colonoscopy  CTAP with contrast 12/2019: Lower chest: No acute abnormality. Coronary artery calcification noted.   Hepatobiliary: 5 mm low-density lesion within the posterior right hepatic lobe, too small to definitively characterize, but most likely represents cyst versus hemangioma. The liver is otherwise unremarkable. Gallbladder within normal limits. No hyperdense gallstone. No biliary dilatation.   Pancreas: Unremarkable. No pancreatic ductal dilatation or surrounding inflammatory changes.   Spleen: Normal in size without focal abnormality.   Adrenals/Urinary Tract: Unremarkable adrenal glands. Kidneys enhance symmetrically without focal lesion, stone, or hydronephrosis. Ureters are nondilated. Urinary bladder appears unremarkable.   Stomach/Bowel: Small sliding type hiatal hernia. Stomach appears otherwise unremarkable. Small bowel well distended with oral contrast. No dilated loops of bowel to suggest obstruction. Moderate to large volume of predominantly liquid stool throughout the colon. A normal appendix is present in the right lower quadrant. No focal bowel wall thickening or inflammatory changes.   Vascular/Lymphatic: Scattered aortoiliac atherosclerotic calcifications without aneurysm. No abdominopelvic lymphadenopathy.   Reproductive: Status post hysterectomy. No adnexal masses.   Other: No free fluid. No intra-abdominal fluid collection. No free air.   Musculoskeletal: No acute or significant osseous findings. Mild-moderate arthropathy of the bilateral SI joints.   IMPRESSION: 1. Moderate to large volume of predominantly liquid stool throughout the colon suggesting a diarrheal illness. No evidence of active bowel inflammation or bowel obstruction. 2. Small sliding type hiatal hernia. 3. Aortic atherosclerosis. (ICD10-I70.0).    Past Medical History:  Diagnosis Date   Anemia    Arthritis     "neck, hips" (04/25/2018)   Claustrophobia    Daily headache    GERD (gastroesophageal reflux disease)    Hiatal hernia    Hyperlipidemia    "hx" (04/25/2018)   Hypertension    Plantar fasciitis    hx - left foot   Stroke ([place]) 05/2014   "mini stroke" (04/25/2018)   Past Surgical History:  Procedure Laterality Date   ABDOMINAL HYSTERECTOMY  12/09   Secondary to fibroids   ARTERY BIOPSY Right 04/10/2018   Procedure: BIOPSY TEMPORAL ARTERY;  Surgeon: Rosetta Posner, MD;  Location: [facility];  Service: Vascular;  Laterality: Right;   [place]; 1988   COLONOSCOPY     FOOT SURGERY Left 03/2017   Bone Spurs Removed   LUMBAR LAMINECTOMY/DECOMPRESSION MICRODISCECTOMY N/A 04/13/2021   Procedure: LUMBAR FOUR- LUMBAR FIVE AND LUMBAR FIVE-SACRAL ONE  DECOMPRESSION;  Surgeon: Marybelle Killings, MD;  Location: [facility];  Service: Orthopedics;  Laterality: N/A;   RADIOLOGY WITH ANESTHESIA N/A 04/26/2018   Procedure: MRI WITH ANESTHESIA;  Surgeon: Radiologist, Medication, MD;  Location: [facility];  Service: Radiology;  Laterality: N/A;   RADIOLOGY WITH ANESTHESIA N/A 08/24/2019   Procedure: MRI WITH ANESTHESIA CERVICAL SPINE;  Surgeon: Radiologist, Medication, MD;  Location: [facility];  Service: Radiology;  Laterality: N/A;   RADIOLOGY WITH ANESTHESIA N/A 10/02/2019   Procedure: MRI  WITH ANESTHESIA CERVICAL WITHOUT CONTRAST;  Surgeon: Radiologist, Medication, MD;  Location: [facility];  Service: Radiology;  Laterality: N/A;   TUBAL LIGATION  1988   Current Medications, Allergies, Past Medical History, Past Surgical History, Family History and Social History were reviewed in [employer] record.  Review of Systems:   Constitutional: Negative for fever, sweats, chills or weight loss.  Respiratory: Negative for shortness of breath.   Cardiovascular: Negative for chest pain, palpitations and leg swelling.  Gastrointestinal: See HPI.  Musculoskeletal: Negative for back pain or muscle aches.   Neurological: Negative for dizziness, headaches or paresthesias.   Physical Exam: BP 124/80    Pulse 86    Ht 4\' 9"  (1.448 m)    Wt 140 lb (63.5 kg)    LMP  (LMP Unknown)    BMI 30.30 kg/m   General: 61 year old female no acute distress. Head: Normocephalic and atraumatic. Eyes: No scleral icterus. Conjunctiva pink . Ears: Normal auditory acuity. Mouth: Dentition intact. No ulcers or lesions.  Lungs: Clear throughout to auscultation. Heart: Regular rate and rhythm, no murmur. Abdomen: Soft, nondistended.  Mild epigastric tenderness without rebound or guarding.  No masses or hepatomegaly. Normal bowel sounds x 4 quadrants.  Rectal: Deferred. Musculoskeletal: Symmetrical with no gross deformities. Extremities: No edema. Neurological: Alert oriented x 4. No focal deficits.  Psychological: Alert and cooperative. Normal mood and affect  Assessment and Recommendations:  1) GERD, worsening reflux symptoms with epigastric pain over the past 6 weeks.  No improvement on Omeprazole 20 mg p.o. twice daily.  Normal EGD 10/2019. + NSAID use.  -EGD benefits and risks discussed including risk with sedation, risk of bleeding, perforation and infection  -GERD diet hand -Stop Omeprazole.  Start Pantoprazole 40 mg 1 p.o. twice daily, if reflux symptoms improve decrease Pantoprazole to once daily in 2 weeks. -Avoid Aleve/NSAIDs  2) Elevated LFTs -Hepatic panel, if LFTs remain elevated she will require additional laboratory studies to check for hepatitis A, B, C and autoimmune liver diseas -RUQ sonogram to evaluate the gallbladder and liver  3) Microcytic anemia, iron deficiency anemia. EGD and colonoscopy 2021 were unrevealing.  Microcytic anemia persisted post hysterectomy.  No overt GI bleeding. -CBC, iron, iron saturation, TIBC, Ferritin,  B12 and CRP -Consider small bowel capsule endoscopy if the above labs identify IDA and if EGD negative  4) Chronic diarrhea secondary to pancreatic  insufficiency. Diarrhea, bloat and weight loss improved on Creon. No evidence of IBD or celiac disease per EGD/colonoscopy 10/2019. -Continue Creon as previously prescribed

## 2021-07-07 NOTE — Patient Instructions (Signed)
PROCEDURES: You have been scheduled for a EGD. Please follow the written instructions given to you at your visit today. If you use inhalers (even only as needed), please bring them with you on the day of your procedure.  LABS:  Lab work has been ordered for you today. Our lab is located in the basement. Press "B" on the elevator. The lab is located at the first door on the left as you exit the elevator.  HEALTHCARE LAWS AND MY CHART RESULTS: Due to recent changes in healthcare laws, you may see the results of your imaging and laboratory studies on MyChart before your provider has had a chance to review them.   We understand that in some cases there may be results that are confusing or concerning to you. Not all laboratory results come back in the same time frame and the provider may be waiting for multiple results in order to interpret others.  Please give [place] 48 hours in order for your provider to thoroughly review all the results before contacting the office for clarification of your results.   IMAGING: You will be contacted by [facility] (Your caller ID will indicate phone # [phone]) in the next 7 days to schedule your Abdominal Ultrasound. If you have not heard from them within 7 business days, please call [facility] at [phone] to follow up on the status of your appointment.    MEDICATION: We have sent the following medication to your pharmacy for you to pick up at your convenience: Pantoprazole 40 MG twice a day.  RECOMMENDATIONS: Stop Omeprazole. Start the Pantoprazole. Reduce Pantoprazole to once daily in two weeks if reflux symptoms improve. It was great seeing you today! Thank you for entrusting me with your care and choosing [facility].  Noralyn Pick, CRNP  Gastroesophageal Reflux Disease, Adult Gastroesophageal reflux (GER) happens when acid from the stomach flows up into the tube that connects the mouth  and the stomach (esophagus). Normally, food travels down the esophagus and stays in the stomach to be digested. With GER, food and stomach acid sometimes move back up into the esophagus. You may have a disease called gastroesophageal reflux disease (GERD) if the reflux: Happens often. Causes frequent or very bad symptoms. Causes problems such as damage to the esophagus. When this happens, the esophagus becomes sore and swollen. Over time, GERD can make small holes (ulcers) in the lining of the esophagus. What are the causes? This condition is caused by a problem with the muscle between the esophagus and the stomach. When this muscle is weak or not normal, it does not close properly to keep food and acid from coming back up from the stomach. The muscle can be weak because of: Tobacco use. Pregnancy. Having a certain type of hernia (hiatal hernia). Alcohol use. Certain foods and drinks, such as coffee, chocolate, onions, and peppermint. What increases the risk? Being overweight. Having a disease that affects your connective tissue. Taking NSAIDs, such a ibuprofen. What are the signs or symptoms? Heartburn. Difficult or painful swallowing. The feeling of having a lump in the throat. A bitter taste in the mouth. Bad breath. Having a lot of saliva. Having an upset or bloated stomach. Burping. Chest pain. Different conditions can cause chest pain. Make sure you see your doctor if you have chest pain. Shortness of breath or wheezing. A long-term cough or a cough at night. Wearing away of the surface of teeth (tooth enamel). Weight loss. How  is this treated? Making changes to your diet. Taking medicine. Having surgery. Treatment will depend on how bad your symptoms are. Follow these instructions at home: Eating and drinking  Follow a diet as told by your doctor. You may need to avoid foods and drinks such as: Coffee and tea, with or without caffeine. Drinks that contain  alcohol. Energy drinks and sports drinks. Bubbly (carbonated) drinks or sodas. Chocolate and cocoa. Peppermint and mint flavorings. Garlic and onions. Horseradish. Spicy and acidic foods. These include peppers, chili powder, curry powder, vinegar, hot sauces, and BBQ sauce. Citrus fruit juices and citrus fruits, such as oranges, lemons, and limes. Tomato-based foods. These include red sauce, chili, salsa, and pizza with red sauce. Fried and fatty foods. These include donuts, french fries, potato chips, and high-fat dressings. High-fat meats. These include hot dogs, rib eye steak, sausage, ham, and bacon. High-fat dairy items, such as whole milk, butter, and cream cheese. Eat small meals often. Avoid eating large meals. Avoid drinking large amounts of liquid with your meals. Avoid eating meals during the 2-3 hours before bedtime. Avoid lying down right after you eat. Do not exercise right after you eat. Lifestyle  Do not smoke or use any products that contain nicotine or tobacco. If you need help quitting, ask your doctor. Try to lower your stress. If you need help doing this, ask your doctor. If you are overweight, lose an amount of weight that is healthy for you. Ask your doctor about a safe weight loss goal. General instructions Pay attention to any changes in your symptoms. Take over-the-counter and prescription medicines only as told by your doctor. Do not take aspirin, ibuprofen, or other NSAIDs unless your doctor says it is okay. Wear loose clothes. Do not wear anything tight around your waist. Raise (elevate) the head of your bed about 6 inches (15 cm). You may need to use a wedge to do this. Avoid bending over if this makes your symptoms worse. Keep all follow-up visits. Contact a doctor if: You have new symptoms. You lose weight and you do not know why. You have trouble swallowing or it hurts to swallow. You have wheezing or a cough that keeps happening. You have a hoarse  voice. Your symptoms do not get better with treatment. Get help right away if: You have sudden pain in your arms, neck, jaw, teeth, or back. You suddenly feel sweaty, dizzy, or light-headed. You have chest pain or shortness of breath. You vomit and the vomit is green, yellow, or black, or it looks like blood or coffee grounds. You faint. Your poop (stool) is red, bloody, or black. You cannot swallow, drink, or eat. These symptoms may represent a serious problem that is an emergency. Do not wait to see if the symptoms will go away. Get medical help right away. Call your local emergency services (911 in the [place] Do not drive yourself to the hospital. Summary If a person has gastroesophageal reflux disease (GERD), food and stomach acid move back up into the esophagus and cause symptoms or problems such as damage to the esophagus. Treatment will depend on how bad your symptoms are. Follow a diet as told by your doctor. Take all medicines only as told by your doctor. This information is not intended to replace advice given to you by your health care provider. Make sure you discuss any questions you have with your health care provider. Document Revised: 12/22/2019 Document Reviewed: 12/22/2019 Elsevier Patient Education  [place]  [facility] providers would like to encourage you to use [facility] to communicate with providers for non-urgent requests or questions.  Due to long hold times on the telephone, sending your provider a message by [facility] may be faster and more efficient way to get a response. Please allow 48 business hours for a response.  Please remember that this is for non-urgent requests/questions. If you are age 50 or older, your body mass index should be between 23-30. Your Body mass index is 30.3 kg/m. If this is out of the aforementioned range listed, please consider follow up with your Primary Care Provider.  If you are age 21 or younger, your body mass index  should be between 19-25. Your Body mass index is 30.3 kg/m. If this is out of the aformentioned range listed, please consider follow up with your Primary Care Provider.

## 2021-07-07 NOTE — Progress Notes (Signed)
RADIOLOGY SCHEDULING REQUEST FOR [facility] [place] [facility] Scheduling via secure staff message.

## 2021-07-13 ENCOUNTER — Other Ambulatory Visit: Payer: Self-pay

## 2021-07-13 ENCOUNTER — Telehealth: Payer: Self-pay

## 2021-07-13 DIAGNOSIS — K529 Noninfective gastroenteritis and colitis, unspecified: Secondary | ICD-10-CM

## 2021-07-13 DIAGNOSIS — R634 Abnormal weight loss: Secondary | ICD-10-CM

## 2021-07-13 DIAGNOSIS — R110 Nausea: Secondary | ICD-10-CM

## 2021-07-13 DIAGNOSIS — R7989 Other specified abnormal findings of blood chemistry: Secondary | ICD-10-CM

## 2021-07-13 NOTE — Telephone Encounter (Signed)
-----   Message from Noralyn Pick, NP sent at 07/13/2021  8:57 AM EST ----- Eustaquio Maize, please contact the patient let her know her LFTs remain mildly elevated.  She should proceed with her abdominal sonogram as scheduled tomorrow.  Proceed with EGD 08/18/2021 as scheduled.  Please send her to the lab in 2 weeks to have a repeat hepatic panel, hepatitis A total antibody, hepatitis B surface antigen, hepatitis B core total antibody, hepatitis B surface antibody, hepatitis C antibody, ceruloplasmin, alpha-1 antitrypsin level, ANA, SMA, AMA, IgG level, CRP and iron panel.  Diagnosis elevated LFTs and IDA.  When you contact the patient please ask her if she is ever seen a hematologist due to her history of anemia.  Thank you

## 2021-07-13 NOTE — Telephone Encounter (Signed)
Left message for patient to please call back. 

## 2021-07-14 ENCOUNTER — Ambulatory Visit (HOSPITAL_COMMUNITY)
Admission: RE | Admit: 2021-07-14 | Discharge: 2021-07-14 | Disposition: A | Discharge status: Home / Self Care | Payer: 59 | Source: Ambulatory Visit | Attending: Nurse Practitioner | Admitting: Nurse Practitioner

## 2021-07-14 ENCOUNTER — Other Ambulatory Visit: Payer: Self-pay

## 2021-07-14 DIAGNOSIS — R110 Nausea: Secondary | ICD-10-CM | POA: Diagnosis present

## 2021-07-14 DIAGNOSIS — R7989 Other specified abnormal findings of blood chemistry: Secondary | ICD-10-CM | POA: Insufficient documentation

## 2021-07-14 DIAGNOSIS — K219 Gastro-esophageal reflux disease without esophagitis: Secondary | ICD-10-CM | POA: Diagnosis present

## 2021-07-26 ENCOUNTER — Telehealth: Payer: Self-pay | Admitting: Orthopaedic Surgery

## 2021-07-26 NOTE — Telephone Encounter (Signed)
Pt asked for a call back from [place]. Pt states she has some medical questions concerning her back. Please call pt at [phone].

## 2021-07-26 NOTE — Telephone Encounter (Signed)
Returned patient's call and she states she can not make it into our lab tomorrow, because of her work, but she will try to come in the next morning.

## 2021-07-26 NOTE — Telephone Encounter (Signed)
Patient is calling to ask if she can speak with a nurse regarding her results.

## 2021-07-27 ENCOUNTER — Other Ambulatory Visit: Payer: Self-pay | Admitting: Emergency Medicine

## 2021-07-27 ENCOUNTER — Other Ambulatory Visit: Payer: Self-pay | Admitting: Orthopaedic Surgery

## 2021-07-27 ENCOUNTER — Other Ambulatory Visit (HOSPITAL_COMMUNITY): Payer: Self-pay

## 2021-07-27 DIAGNOSIS — E785 Hyperlipidemia, unspecified: Secondary | ICD-10-CM

## 2021-07-27 MED ORDER — ROSUVASTATIN CALCIUM 20 MG PO TABS
20.0000 mg | ORAL_TABLET | Freq: Every day | ORAL | 3 refills | Status: DC
  Filled 2021-07-27 (×2): qty 90, 90d supply, fill #0
  Filled 2022-04-10: qty 30, 30d supply, fill #1

## 2021-07-27 MED ORDER — PREDNISONE 10 MG (21) PO TBPK
ORAL_TABLET | ORAL | 0 refills | Status: DC
  Filled 2021-07-27: qty 21, 6d supply, fill #0

## 2021-07-27 MED FILL — Pancrelipase (Lip-Prot-Amyl) DR Cap 36000-114000-180000 Unit: ORAL | 30 days supply | Qty: 330 | Fill #3 | Status: AC

## 2021-07-28 ENCOUNTER — Other Ambulatory Visit (HOSPITAL_COMMUNITY): Payer: Self-pay

## 2021-07-28 MED ORDER — BENZONATATE 200 MG PO CAPS
200.0000 mg | ORAL_CAPSULE | Freq: Three times a day (TID) | ORAL | 0 refills | Status: AC | PRN
  Filled 2021-07-28: qty 21, 7d supply, fill #0

## 2021-07-28 MED ORDER — ONDANSETRON 4 MG PO TBDP
4.0000 mg | ORAL_TABLET | Freq: Three times a day (TID) | ORAL | 0 refills | Status: AC | PRN
  Filled 2021-07-28: qty 6, 2d supply, fill #0

## 2021-07-28 NOTE — Telephone Encounter (Signed)
Could you please call patient and work into my schedule tomorrow afternoon?

## 2021-07-28 NOTE — Telephone Encounter (Signed)
Pt called and states that she has a bacteria infection and will be staying home and resting.   CB [phone]

## 2021-07-28 NOTE — Telephone Encounter (Signed)
Not if she is sick.  We could work her in for next Tuesday if that is better. Thank you.

## 2021-08-17 ENCOUNTER — Other Ambulatory Visit: Payer: Self-pay

## 2021-08-17 ENCOUNTER — Encounter: Payer: Self-pay | Admitting: Orthopaedic Surgery

## 2021-08-17 ENCOUNTER — Ambulatory Visit (INDEPENDENT_AMBULATORY_CARE_PROVIDER_SITE_OTHER): Payer: 59 | Admitting: Orthopaedic Surgery

## 2021-08-17 VITALS — BP 159/101 | HR 80 | Ht <= 58 in | Wt 140.0 lb

## 2021-08-17 DIAGNOSIS — Z9889 Other specified postprocedural states: Secondary | ICD-10-CM

## 2021-08-17 NOTE — Progress Notes (Signed)
Office Visit Note   Patient: [name]           Date of Birth: [birth year]           MRN: [id number] Visit Date: 08/17/2021              Requested by: Horald Pollen, [place],  [place] [address] PCP: Horald Pollen, MD   Assessment & Plan: Visit Diagnoses:  1. Status post lumbar spine surgery for decompression of spinal cord     Plan: Patient is having problems with her facet lumbar arthritis with stiffness after prolonged sitting problems with weather changes.  She did talk with her PCP about possibly taking 2 aspirin twice a day versus a standard anti-inflammatory with her history of TIA.  She did have to be watched with her history of GERD make sure she does not have any drop in her hemoglobin.  She can she can continue walking program and stretching program.  Follow-up here as needed.   Follow-Up Instructions: No follow-ups on file.   Orders:  No orders of the defined types were placed in this encounter.  No orders of the defined types were placed in this encounter.     Procedures: No procedures performed   Clinical Data: No additional findings.   Subjective: Chief Complaint  Patient presents with   Lower Back - Pain    HPI 4 months post lumbar decompression L4-5, L5-S1.  She has problems when she sits she is stiff has problems getting going.  No claudication and she notes improvement in the pain that she had preop.  Occasionally some pain radiates to her left foot.  She had previous TIA has hypertension and cannot take anti-inflammatories.  Review of Systems updated unchanged.   Objective: Vital Signs: BP (!) 159/101    Pulse 80    Ht 4\' 9"  (1.448 m)    Wt 140 lb (63.5 kg)    LMP  (LMP Unknown)    BMI 30.30 kg/m   Physical Exam Constitutional:      Appearance: She is well-developed.  HENT:     Head: Normocephalic.     Right Ear: External ear normal.     Left Ear: External ear normal. There is no impacted  cerumen.  Eyes:     Pupils: Pupils are equal, round, and reactive to light.  Neck:     Thyroid: No thyromegaly.     Trachea: No tracheal deviation.  Cardiovascular:     Rate and Rhythm: Normal rate.  Pulmonary:     Effort: Pulmonary effort is normal.  Abdominal:     Palpations: Abdomen is soft.  Musculoskeletal:     Cervical back: No rigidity.  Skin:    General: Skin is warm and dry.  Neurological:     Mental Status: She is alert and oriented to person, place, and time.  Psychiatric:        Behavior: Behavior normal.    Ortho Exam well-healed lumbar incision no isolated motor weakness lower extremity reflexes are intact anterior tib is strong.  Lumbar incision is well-healed.  Specialty Comments:  No specialty comments available.  Imaging: No results found.   PMFS History: Patient Active Problem List   Diagnosis Date Noted   Status post lumbar spine surgery for decompression of spinal cord 05/24/2021   Dyspepsia 05/23/2021   History of gastroesophageal reflux (GERD) 05/23/2021   History of lumbar laminectomy for spinal cord decompression 04/26/2021   Lumbar  stenosis 04/13/2021   Tension headache 12/22/2020   Low back pain 10/20/2020   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/28/2020   Chronic diarrhea 09/14/2017   H/O TIA (transient ischemic attack) and stroke 06/12/2014   Cervical radiculitis 02/12/2014   Essential hypertension, benign 01/24/2010   Microcytic anemia 01/11/2010   Hyperlipemia 05/28/2007   Current smoker 05/28/2007   GERD 05/28/2007   Past Medical History:  Diagnosis Date   Anemia    Arthritis    "neck, hips" (04/25/2018)   Claustrophobia    Daily headache    GERD (gastroesophageal reflux disease)    Hiatal hernia    Hyperlipidemia    "hx" (04/25/2018)   Hypertension    Plantar fasciitis    hx - left foot   Stroke ([place]) 05/2014   "mini stroke" (04/25/2018)    Family History  Problem Relation Age of Onset   Cancer Mother 88        pancreatic cancer   Pancreatic cancer Mother    Heart disease Mother    Cancer Father 59       throat cancer   Esophageal cancer Father    Cancer Maternal Aunt 60       breast cancer   Cancer Other        Died  from Colon cancer in 60's   Colon cancer Maternal Uncle    Colon cancer Paternal Uncle    Heart disease Sister    Rectal cancer Neg Hx    Stomach cancer Neg Hx     Past Surgical History:  Procedure Laterality Date   ABDOMINAL HYSTERECTOMY  12/09   Secondary to fibroids   ARTERY BIOPSY Right 04/10/2018   Procedure: BIOPSY TEMPORAL ARTERY;  Surgeon: Rosetta Posner, MD;  Location: [facility] OR;  Service: Vascular;  Laterality: Right;   [place]; 1988   COLONOSCOPY     FOOT SURGERY Left 03/2017   Bone Spurs Removed   LUMBAR LAMINECTOMY/DECOMPRESSION MICRODISCECTOMY N/A 04/13/2021   Procedure: LUMBAR FOUR- LUMBAR FIVE AND LUMBAR FIVE-SACRAL ONE  DECOMPRESSION;  Surgeon: Marybelle Killings, MD;  Location: [facility];  Service: Orthopedics;  Laterality: N/A;   RADIOLOGY WITH ANESTHESIA N/A 04/26/2018   Procedure: MRI WITH ANESTHESIA;  Surgeon: Radiologist, Medication, MD;  Location: [facility];  Service: Radiology;  Laterality: N/A;   RADIOLOGY WITH ANESTHESIA N/A 08/24/2019   Procedure: MRI WITH ANESTHESIA CERVICAL SPINE;  Surgeon: Radiologist, Medication, MD;  Location: [facility];  Service: Radiology;  Laterality: N/A;   RADIOLOGY WITH ANESTHESIA N/A 10/02/2019   Procedure: MRI WITH ANESTHESIA CERVICAL WITHOUT CONTRAST;  Surgeon: Radiologist, Medication, MD;  Location: [facility];  Service: Radiology;  Laterality: N/A;   TUBAL LIGATION  1988   Social History   Occupational History   Occupation: unemployed    Comment: used to work in a [employer]  Tobacco Use   Smoking status: Some Days    Packs/day: 0.25    Years: 38.00    Pack years: 9.50    Types: Cigarettes   Smokeless tobacco: Never   Tobacco comments:    0-4 cigs/day   Vaping Use   Vaping Use: Never used  Substance and Sexual  Activity   Alcohol use: Yes    Alcohol/week: 4.0 standard drinks    Types: 4 Cans of beer per week   Drug use: Not Currently   Sexual activity: Not Currently    Birth control/protection: Surgical    Comment: Hysterectomy

## 2021-08-18 ENCOUNTER — Other Ambulatory Visit (INDEPENDENT_AMBULATORY_CARE_PROVIDER_SITE_OTHER): Payer: 59

## 2021-08-18 ENCOUNTER — Encounter: Payer: Self-pay | Admitting: Internal Medicine

## 2021-08-18 ENCOUNTER — Ambulatory Visit (AMBULATORY_SURGERY_CENTER): Payer: 59 | Admitting: Internal Medicine

## 2021-08-18 VITALS — BP 130/78 | HR 71 | Temp 97.3°F | Resp 16 | Ht <= 58 in | Wt 140.0 lb

## 2021-08-18 DIAGNOSIS — R7989 Other specified abnormal findings of blood chemistry: Secondary | ICD-10-CM

## 2021-08-18 DIAGNOSIS — R634 Abnormal weight loss: Secondary | ICD-10-CM

## 2021-08-18 DIAGNOSIS — K449 Diaphragmatic hernia without obstruction or gangrene: Secondary | ICD-10-CM

## 2021-08-18 DIAGNOSIS — R110 Nausea: Secondary | ICD-10-CM

## 2021-08-18 DIAGNOSIS — K219 Gastro-esophageal reflux disease without esophagitis: Secondary | ICD-10-CM | POA: Diagnosis present

## 2021-08-18 DIAGNOSIS — K529 Noninfective gastroenteritis and colitis, unspecified: Secondary | ICD-10-CM | POA: Diagnosis not present

## 2021-08-18 LAB — HEPATIC FUNCTION PANEL
ALT: 85 U/L — ABNORMAL HIGH (ref 0–35)
AST: 44 U/L — ABNORMAL HIGH (ref 0–37)
Albumin: 4.1 g/dL (ref 3.5–5.2)
Alkaline Phosphatase: 90 U/L (ref 39–117)
Bilirubin, Direct: 0.1 mg/dL (ref 0.0–0.3)
Total Bilirubin: 0.5 mg/dL (ref 0.2–1.2)
Total Protein: 7 g/dL (ref 6.0–8.3)

## 2021-08-18 LAB — C-REACTIVE PROTEIN: CRP: <1 mg/dL (ref 0.5–20.0)

## 2021-08-18 MED ORDER — SODIUM CHLORIDE 0.9 % IV SOLN: 500.0000 mL | Freq: Once | INTRAVENOUS | Status: DC

## 2021-08-18 NOTE — Patient Instructions (Signed)
YOU HAD AN ENDOSCOPIC PROCEDURE TODAY AT THE [facility]:   Refer to the procedure report that was given to you for any specific questions about what was found during the examination.  If the procedure report does not answer your questions, please call your gastroenterologist to clarify.  If you requested that your care partner not be given the details of your procedure findings, then the procedure report has been included in a sealed envelope for you to review at your convenience later.  YOU SHOULD EXPECT: Some feelings of bloating in the abdomen. Passage of more gas than usual.  Walking can help get rid of the air that was put into your GI tract during the procedure and reduce the bloating. If you had a lower endoscopy (such as a colonoscopy or flexible sigmoidoscopy) you may notice spotting of blood in your stool or on the toilet paper. If you underwent a bowel prep for your procedure, you may not have a normal bowel movement for a few days.  Please Note:  You might notice some irritation and congestion in your nose or some drainage.  This is from the oxygen used during your procedure.  There is no need for concern and it should clear up in a day or so.  SYMPTOMS TO REPORT IMMEDIATELY:    Following upper endoscopy (EGD)  Vomiting of blood or coffee ground material  New chest pain or pain under the shoulder blades  Painful or persistently difficult swallowing  New shortness of breath  Fever of 100F or higher  Black, tarry-looking stools  For urgent or emergent issues, a gastroenterologist can be reached at any hour by calling [phone]. Do not use MyChart messaging for urgent concerns.    DIET:  We do recommend a small meal at first, but then you may proceed to your regular diet.  Drink plenty of fluids but you should avoid alcoholic beverages for 24 hours.  ACTIVITY:  You should plan to take it easy for the rest of today and you should NOT DRIVE or use heavy machinery  until tomorrow (because of the sedation medicines used during the test).    FOLLOW UP: Our staff will call the number listed on your records 48-72 hours following your procedure to check on you and address any questions or concerns that you may have regarding the information given to you following your procedure. If we do not reach you, we will leave a message.  We will attempt to reach you two times.  During this call, we will ask if you have developed any symptoms of COVID 19. If you develop any symptoms (ie: fever, flu-like symptoms, shortness of breath, cough etc.) before then, please call [phone].  If you test positive for Covid 19 in the 2 weeks post procedure, please call and report this information to us.    If any biopsies were taken you will be contacted by phone or by letter within the next 1-3 weeks.  Please call us at [phone] if you have not heard about the biopsies in 3 weeks.    SIGNATURES/CONFIDENTIALITY: You and/or your care partner have signed paperwork which will be entered into your electronic medical record.  These signatures attest to the fact that that the information above on your After Visit Summary has been reviewed and is understood.  Full responsibility of the confidentiality of this discharge information lies with you and/or your care-partner. 

## 2021-08-18 NOTE — Op Note (Signed)
[facility] Patient Name: [name] Procedure Date: 08/18/2021 10:50 AM MRN: [id number] Endoscopist: Docia Chuck. Henrene Pastor , MD Age: 61 Referring MD:  Date of Birth: 1961/03/23 Gender: Female Account #: [id number] Procedure:                Upper GI endoscopy Indications:              Esophageal reflux symptoms that persist despite                            appropriate therapy. Improved after changing to                            pantoprazole 40 mg daily Medicines:                Monitored Anesthesia Care Procedure:                Pre-Anesthesia Assessment:                           - Prior to the procedure, a History and Physical                            was performed, and patient medications and                            allergies were reviewed. The patient's tolerance of                            previous anesthesia was also reviewed. The risks                            and benefits of the procedure and the sedation                            options and risks were discussed with the patient.                            All questions were answered, and informed consent                            was obtained. Prior Anticoagulants: The patient has                            taken no previous anticoagulant or antiplatelet                            agents. ASA Grade Assessment: II - A patient with                            mild systemic disease. After reviewing the risks                            and benefits, the patient was deemed in  satisfactory condition to undergo the procedure.                           After obtaining informed consent, the endoscope was                            passed under direct vision. Throughout the                            procedure, the patient's blood pressure, pulse, and                            oxygen saturations were monitored continuously. The                            GIF [id number] #[id number] was  introduced through the                            mouth, and advanced to the second part of duodenum.                            The upper GI endoscopy was accomplished without                            difficulty. The patient tolerated the procedure                            well. Scope In: Scope Out: Findings:                 The esophagus was normal.                           The stomach was normal. Small hiatal hernia.                           The examined duodenum was normal.                           The cardia and gastric fundus were normal on                            retroflexion. Complications:            No immediate complications. Estimated Blood Loss:     Estimated blood loss: none. Impression:               1. GERD                           2. Normal EGD save hiatal hernia                           3. Elevated liver tests. Recommendation:           - Patient has a contact number available for  emergencies. The signs and symptoms of potential                            delayed complications were discussed with the                            patient. Return to normal activities tomorrow.                            Written discharge instructions were provided to the                            patient.                           - Resume previous diet.                           - Continue present medications.                           - Please send the patient down to the laboratory to                            have her blood work (multiple tests ordered July 13, 2021) done Docia Chuck. Henrene Pastor, MD 08/18/2021 11:04:16 AM This report has been signed electronically.

## 2021-08-18 NOTE — Progress Notes (Signed)
VS  DT ? ?Pt's states no medical or surgical changes since previsit or office visit. ? ?

## 2021-08-18 NOTE — Progress Notes (Signed)
Report to PACU, RN, vss, BBS= Clear.  

## 2021-08-18 NOTE — Progress Notes (Signed)
07/07/2021 [name] [id number] 01-29-1961     Chief Complaint: Worsening reflux    History of Present Illness: [name] is a 61 y.o. female with pancreatic insufficiency by history of hypertension, anemia, GERD, chronic diarrhea with pancreatic insufficiency on Creon. S/P hysterectomy 2009 due to uterine fibroids and lumbar decompression 03/2021. She is followed by Dr. Henrene Pastor.  She presents to our office today as referred by Dr. Ines Bloomer Sagardia for further evaluation regarding worsening reflux symptoms, consider scheduling an EGD to evaluate elevated LFTs.  She was previously taking Omeprazole 20 mg once daily.  Her reflux symptoms have progressively worsened over the past 6 weeks.  She is now taking Omeprazole 20 mg twice daily for the past few weeks without significant improvement.  She has significant epigastric burning which radiates up into the esophagus.  No dysphagia.  She has intermittent nausea without vomiting.  She wishes to schedule a repeat EGD.  She remains on Creon without further diarrhea, abdominal bloat or weight loss.  She is passing normal soft brown bowel movement most days.  No rectal bleeding or black stools.  She describes having a vague right and left abdominal pain near her hips which is worse when she walks for the past 2 weeks.  She takes Aleve 2 tabs daily for back pain.  She drinks 3 beers weekly.  She underwent an EGD 11/11/2019 which was normal.  A colonoscopy was done on the same date which identified a 3 mm sessile serrated polyp which was removed from the transverse colon.  Random colon biopsies were negative for colitis.  No known family history of IBD or colon cancer. Mother with history of pancreatic cancer.   Her most recent laboratory studies 05/23/2021 showed stable microcytic anemia with a hemoglobin level of 10.6.  Elevated LFTs with AST level 68.   Remote history of menorrhagia secondary to uterine fibroids.  She underwent a hysterectomy in  2009.  Her microcytic anemia persisted.  She underwent an EGD and colonoscopy 10/2019, findings did not explain her IDA.   CBC Latest Ref Rng & Units 05/23/2021 04/07/2021 12/22/2020  WBC 4.0 - 10.5 K/uL 5.2 6.7 6.6  Hemoglobin 12.0 - 15.0 g/dL 10.6(L) 10.8(L) 10.2(L)  Hematocrit 36.0 - 46.0 % 34.0(L) 35.1(L) 32.3(L)  Platelets 150.0 - 400.0 K/uL 326.0 280 239.0  Iron level 31 and B12 level 241 on 10/10/2019 (she was prescribed ferrous sulfate 325 mg and OTC B12 supplement at that time which she is no longer taking)   CMP Latest Ref Rng & Units 05/23/2021 04/07/2021 12/22/2020  Glucose 70 - 99 mg/dL 86 98 106(H)  BUN 6 - 23 mg/dL 11 13 18   Creatinine 0.40 - 1.20 mg/dL 0.55 0.62 0.79  Sodium 135 - 145 mEq/L 144 143 140  Potassium 3.5 - 5.1 mEq/L 3.8 3.2(L) 3.6  Chloride 96 - 112 mEq/L 106 106 106  CO2 19 - 32 mEq/L 28 27 26   Calcium 8.4 - 10.5 mg/dL 8.9 8.9 9.0  Total Protein 6.0 - 8.3 g/dL 7.3 - 7.1  Total Bilirubin 0.2 - 1.2 mg/dL 0.3 - 0.2  Alkaline Phos 39 - 117 U/L 86 - 74  AST 0 - 37 U/L 68(H) - 31  ALT 0 - 35 U/L 83(H) - 51(H)  Lipase 18.   EGD 11/11/2019 done due to weight loss, diarrhea and microcytic anemia: - The esophagus was normal. - The stomach was normal. - The examined duodenum was normal. - The cardia and gastric fundus were normal  on retroflexion.   Colonoscopy 5/18/202 done due to weight loss, diarrhea and microcytic anemia: - The examined portion of the ileum was normal. - One 3 mm polyp in the transverse colon, removed with a cold snare. Resected and retrieved. - Small hemorrhoids. Prominent hypertrophic anal papilla - The entire examined colon is otherwise normal on direct and retroflexion views. Status post biopsies. - 7 year colonoscopy recall 1. Surgical [P], random colon sites - COLONIC MUCOSA WITH NO SIGNIFICANT PATHOLOGIC FINDINGS. - NEGATIVE FOR ACTIVE INFLAMMATION AND OTHER ABNORMALITIES. 2. Surgical [P], colon, transverse, polyp - SESSILE SERRATED  POLYP WITHOUT CYTOLOGIC DYSPLASIA   Colonoscopy 04/10/2014: Normal colonoscopy   CTAP with contrast 12/2019: Lower chest: No acute abnormality. Coronary artery calcification noted.   Hepatobiliary: 5 mm low-density lesion within the posterior right hepatic lobe, too small to definitively characterize, but most likely represents cyst versus hemangioma. The liver is otherwise unremarkable. Gallbladder within normal limits. No hyperdense gallstone. No biliary dilatation.   Pancreas: Unremarkable. No pancreatic ductal dilatation or surrounding inflammatory changes.   Spleen: Normal in size without focal abnormality.   Adrenals/Urinary Tract: Unremarkable adrenal glands. Kidneys enhance symmetrically without focal lesion, stone, or hydronephrosis. Ureters are nondilated. Urinary bladder appears unremarkable.   Stomach/Bowel: Small sliding type hiatal hernia. Stomach appears otherwise unremarkable. Small bowel well distended with oral contrast. No dilated loops of bowel to suggest obstruction. Moderate to large volume of predominantly liquid stool throughout the colon. A normal appendix is present in the right lower quadrant. No focal bowel wall thickening or inflammatory changes.   Vascular/Lymphatic: Scattered aortoiliac atherosclerotic calcifications without aneurysm. No abdominopelvic lymphadenopathy.   Reproductive: Status post hysterectomy. No adnexal masses.   Other: No free fluid. No intra-abdominal fluid collection. No free air.   Musculoskeletal: No acute or significant osseous findings. Mild-moderate arthropathy of the bilateral SI joints.   IMPRESSION: 1. Moderate to large volume of predominantly liquid stool throughout the colon suggesting a diarrheal illness. No evidence of active bowel inflammation or bowel obstruction. 2. Small sliding type hiatal hernia. 3. Aortic atherosclerosis. (ICD10-I70.0).         Past Medical History:  Diagnosis Date   Anemia      Arthritis      "neck, hips" (04/25/2018)   Claustrophobia     Daily headache     GERD (gastroesophageal reflux disease)     Hiatal hernia     Hyperlipidemia      "hx" (04/25/2018)   Hypertension     Plantar fasciitis      hx - left foot   Stroke ([place]) 05/2014    "mini stroke" (04/25/2018)         Past Surgical History:  Procedure Laterality Date   ABDOMINAL HYSTERECTOMY   12/09    Secondary to fibroids   ARTERY BIOPSY Right 04/10/2018    Procedure: BIOPSY TEMPORAL ARTERY;  Surgeon: Rosetta Posner, MD;  Location: [place] [facility];  Service: Vascular;  Laterality: Right;   [place]; 1988   COLONOSCOPY       FOOT SURGERY Left 03/2017    Bone Spurs Removed   LUMBAR LAMINECTOMY/DECOMPRESSION MICRODISCECTOMY N/A 04/13/2021    Procedure: LUMBAR FOUR- LUMBAR FIVE AND LUMBAR FIVE-SACRAL ONE  DECOMPRESSION;  Surgeon: Marybelle Killings, MD;  Location: [facility];  Service: Orthopedics;  Laterality: N/A;   RADIOLOGY WITH ANESTHESIA N/A 04/26/2018    Procedure: MRI WITH ANESTHESIA;  Surgeon: Radiologist, Medication, MD;  Location: [facility];  Service: Radiology;  Laterality: N/A;  RADIOLOGY WITH ANESTHESIA N/A 08/24/2019    Procedure: MRI WITH ANESTHESIA CERVICAL SPINE;  Surgeon: Radiologist, Medication, MD;  Location: [facility];  Service: Radiology;  Laterality: N/A;   RADIOLOGY WITH ANESTHESIA N/A 10/02/2019    Procedure: MRI WITH ANESTHESIA CERVICAL WITHOUT CONTRAST;  Surgeon: Radiologist, Medication, MD;  Location: [facility];  Service: Radiology;  Laterality: N/A;   TUBAL LIGATION   1988    Current Medications, Allergies, Past Medical History, Past Surgical History, Family History and Social History were reviewed in [employer] record.   Review of Systems:   Constitutional: Negative for fever, sweats, chills or weight loss.  Respiratory: Negative for shortness of breath.   Cardiovascular: Negative for chest pain, palpitations and leg swelling.  Gastrointestinal: See HPI.   Musculoskeletal: Negative for back pain or muscle aches.  Neurological: Negative for dizziness, headaches or paresthesias.    Physical Exam: BP 124/80    Pulse 86    Ht 4\' 9"  (1.448 m)    Wt 140 lb (63.5 kg)    LMP  (LMP Unknown)    BMI 30.30 kg/m    General: 60 year old female no acute distress. Head: Normocephalic and atraumatic. Eyes: No scleral icterus. Conjunctiva pink . Ears: Normal auditory acuity. Mouth: Dentition intact. No ulcers or lesions.  Lungs: Clear throughout to auscultation. Heart: Regular rate and rhythm, no murmur. Abdomen: Soft, nondistended.  Mild epigastric tenderness without rebound or guarding.  No masses or hepatomegaly. Normal bowel sounds x 4 quadrants.  Rectal: Deferred. Musculoskeletal: Symmetrical with no gross deformities. Extremities: No edema. Neurological: Alert oriented x 4. No focal deficits.  Psychological: Alert and cooperative. Normal mood and affect   Assessment and Recommendations:   1) GERD, worsening reflux symptoms with epigastric pain over the past 6 weeks.  No improvement on Omeprazole 20 mg p.o. twice daily.  Normal EGD 10/2019. + NSAID use.  -EGD benefits and risks discussed including risk with sedation, risk of bleeding, perforation and infection  -GERD diet hand -Stop Omeprazole.  Start Pantoprazole 40 mg 1 p.o. twice daily, if reflux symptoms improve decrease Pantoprazole to once daily in 2 weeks. -Avoid Aleve/NSAIDs   2) Elevated LFTs -Hepatic panel, if LFTs remain elevated she will require additional laboratory studies to check for hepatitis A, B, C and autoimmune liver diseas -RUQ sonogram to evaluate the gallbladder and liver   3) Microcytic anemia, iron deficiency anemia. EGD and colonoscopy 2021 were unrevealing.  Microcytic anemia persisted post hysterectomy.  No overt GI bleeding. -CBC, iron, iron saturation, TIBC, Ferritin,  B12 and CRP -Consider small bowel capsule endoscopy if the above labs identify IDA and if EGD  negative   4) Chronic diarrhea secondary to pancreatic insufficiency. Diarrhea, bloat and weight loss improved on Creon. No evidence of IBD or celiac disease per EGD/colonoscopy 10/2019. -Continue Creon as previously prescribed     No interval changes [name]., M.D. [facility] Division of Gastroenterology

## 2021-08-22 ENCOUNTER — Telehealth: Payer: Self-pay | Admitting: *Deleted

## 2021-08-22 NOTE — Telephone Encounter (Signed)
°  Follow up Call-  Call back number 08/18/2021 11/11/2019  Post procedure Call Back phone  # [phone]  Permission to leave phone message Yes Yes  Some recent data might be hidden     Patient questions:  Do you have a fever, pain , or abdominal swelling? No. Pain Score  0 *  Have you tolerated food without any problems? Yes.    Have you been able to return to your normal activities? Yes.    Do you have any questions about your discharge instructions: Diet   No. Medications  No. Follow up visit  No.  Do you have questions or concerns about your Care? No.  Actions: * If pain score is 4 or above: No action needed, pain <4.

## 2021-08-23 LAB — MITOCHONDRIAL ANTIBODIES: Mitochondrial M2 Ab, IgG: <=20 U (ref <=20.0)

## 2021-08-23 LAB — IGG: IgG (Immunoglobin G), Serum: 963 mg/dL (ref 600–1640)

## 2021-08-23 LAB — HEPATITIS A ANTIBODY, TOTAL: Hepatitis A AB,Total: REACTIVE — AB

## 2021-08-23 LAB — ALPHA-1-ANTITRYPSIN: A-1 Antitrypsin, Ser: 120 mg/dL (ref 83–199)

## 2021-08-23 LAB — HEPATITIS B CORE ANTIBODY, TOTAL: Hep B Core Total Ab: NONREACTIVE

## 2021-08-23 LAB — ANTI-SMOOTH MUSCLE ANTIBODY, IGG: Actin (Smooth Muscle) Antibody (IGG): <20 U (ref <20)

## 2021-08-23 LAB — IRON,TIBC AND FERRITIN PANEL
%SAT: 31 % (calc) (ref 16–45)
Ferritin: 58 ng/mL (ref 16–232)
Iron: 110 ug/dL (ref 45–160)
TIBC: 354 mcg/dL (calc) (ref 250–450)

## 2021-08-23 LAB — ANTI-NUCLEAR AB-TITER (ANA TITER): ANA Titer 1: 1:80 {titer} — ABNORMAL HIGH

## 2021-08-23 LAB — HEPATITIS C ANTIBODY
Hepatitis C Ab: NONREACTIVE
SIGNAL TO CUT-OFF: 0.03 (ref <1.00)

## 2021-08-23 LAB — CERULOPLASMIN: Ceruloplasmin: 27 mg/dL (ref 18–53)

## 2021-08-23 LAB — HEPATITIS B SURFACE ANTIGEN: Hepatitis B Surface Ag: NONREACTIVE

## 2021-08-23 LAB — ANA: Anti Nuclear Antibody (ANA): POSITIVE — AB

## 2021-08-23 LAB — HEPATITIS B SURFACE ANTIBODY,QUALITATIVE: Hep B S Ab: NONREACTIVE

## 2021-08-24 ENCOUNTER — Telehealth: Payer: Self-pay | Admitting: Physician Assistant

## 2021-08-24 ENCOUNTER — Other Ambulatory Visit: Payer: Self-pay

## 2021-08-24 DIAGNOSIS — R7989 Other specified abnormal findings of blood chemistry: Secondary | ICD-10-CM

## 2021-08-24 DIAGNOSIS — D509 Iron deficiency anemia, unspecified: Secondary | ICD-10-CM

## 2021-08-24 NOTE — Telephone Encounter (Signed)
Scheduled appt per 3/1 referral. Pt is aware of appt date and time. Pt is aware to arrive 15 mins prior to appt time and to bring and updated insurance card. Pt is aware of appt location.   ?

## 2021-09-05 ENCOUNTER — Other Ambulatory Visit (HOSPITAL_COMMUNITY): Payer: Self-pay

## 2021-09-05 ENCOUNTER — Ambulatory Visit (HOSPITAL_COMMUNITY)
Admission: EM | Admit: 2021-09-05 | Discharge: 2021-09-05 | Disposition: A | Discharge status: Home / Self Care | Payer: 59 | Attending: Emergency Medicine | Admitting: Emergency Medicine

## 2021-09-05 ENCOUNTER — Encounter (HOSPITAL_COMMUNITY): Payer: Self-pay

## 2021-09-05 DIAGNOSIS — M5440 Lumbago with sciatica, unspecified side: Secondary | ICD-10-CM

## 2021-09-05 DIAGNOSIS — M25572 Pain in left ankle and joints of left foot: Secondary | ICD-10-CM

## 2021-09-05 MED ORDER — DICLOFENAC SODIUM 1 % EX GEL
4.0000 g | Freq: Four times a day (QID) | CUTANEOUS | 0 refills | Status: AC
  Filled 2021-09-05: qty 100, 7d supply, fill #0

## 2021-09-05 NOTE — ED Provider Notes (Signed)
[facility]  ____________________________________________  Time seen: Approximately 4:19 PM  I have reviewed the triage vital signs and the nursing notes.   HISTORY  Chief Complaint Back Pain and Foot Pain (left)   Historian Patient     HPI [name] is a 61 y.o. female presents to the urgent care with chronic low back pain and left foot pain.  Patient states that she has a follow-up appointment with her primary care provider and podiatrist at the end of the month but is requesting a cam boot and topical Voltaren until she can be seen and evaluated.  She denies numbness or tingling in the extremities.  No bowel or bladder incontinence or saddle anesthesia.   Past Medical History:  Diagnosis Date   Anemia    Arthritis    "neck, hips" (04/25/2018)   Claustrophobia    Daily headache    GERD (gastroesophageal reflux disease)    Hiatal hernia    Hyperlipidemia    "hx" (04/25/2018)   Hypertension    Plantar fasciitis    hx - left foot   Stroke ([facility]) 05/2014   "mini stroke" (04/25/2018)     Immunizations up to date:  Yes.     Past Medical History:  Diagnosis Date   Anemia    Arthritis    "neck, hips" (04/25/2018)   Claustrophobia    Daily headache    GERD (gastroesophageal reflux disease)    Hiatal hernia    Hyperlipidemia    "hx" (04/25/2018)   Hypertension    Plantar fasciitis    hx - left foot   Stroke ([facility]) 05/2014   "mini stroke" (04/25/2018)    Patient Active Problem List   Diagnosis Date Noted   Status post lumbar spine surgery for decompression of spinal cord 05/24/2021   Dyspepsia 05/23/2021   History of gastroesophageal reflux (GERD) 05/23/2021   History of lumbar laminectomy for spinal cord decompression 04/26/2021   Lumbar stenosis 04/13/2021   Tension headache 12/22/2020   Low back pain 10/20/2020   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/28/2020   Chronic diarrhea 09/14/2017   H/O TIA (transient ischemic attack)  and stroke 06/12/2014   Cervical radiculitis 02/12/2014   Essential hypertension, benign 01/24/2010   Microcytic anemia 01/11/2010   Hyperlipemia 05/28/2007   Current smoker 05/28/2007   GERD 05/28/2007    Past Surgical History:  Procedure Laterality Date   ABDOMINAL HYSTERECTOMY  12/09   Secondary to fibroids   ARTERY BIOPSY Right 04/10/2018   Procedure: BIOPSY TEMPORAL ARTERY;  Surgeon: Larina Earthly, MD;  Location: [facility];  Service: Vascular;  Laterality: Right;   CESAREAN SECTION  1986; 1988   COLONOSCOPY     FOOT SURGERY Left 03/2017   Bone Spurs Removed   LUMBAR LAMINECTOMY/DECOMPRESSION MICRODISCECTOMY N/A 04/13/2021   Procedure: LUMBAR FOUR- LUMBAR FIVE AND LUMBAR FIVE-SACRAL ONE  DECOMPRESSION;  Surgeon: Eldred Manges, MD;  [facility];  Service: Orthopedics;  Laterality: N/A;   RADIOLOGY WITH ANESTHESIA N/A 04/26/2018   Procedure: MRI WITH ANESTHESIA;  Surgeon: Radiologist, Medication, MD;  [facility];  Service: Radiology;  Laterality: N/A;   RADIOLOGY WITH ANESTHESIA N/A 08/24/2019   Procedure: MRI WITH ANESTHESIA CERVICAL SPINE;  Surgeon: Radiologist, Medication, MD;  [facility];  Service: Radiology;  Laterality: N/A;   RADIOLOGY WITH ANESTHESIA N/A 10/02/2019   Procedure: MRI WITH ANESTHESIA CERVICAL WITHOUT CONTRAST;  Surgeon: Radiologist, Medication, MD;  [facility];  Service: Radiology;  Laterality: N/A;  TUBAL LIGATION  1988    Prior to Admission medications   Medication Sig Start Date End Date Taking? Authorizing Provider  diclofenac Sodium (VOLTAREN) 1 % GEL Apply 4 g topically 4 (four) times daily for 7 days. 09/05/21 09/12/21 Yes Pia Mau M, PA-C  amLODipine (NORVASC) 5 MG tablet Take 1 tablet (5 mg total) by mouth daily. 06/10/21   Georgina Quint, MD  Diclofenac Sodium 3 % GEL Apply 1 application topically 3 (three) times daily as needed. 06/09/21   Georgina Quint, MD  lipase/protease/amylase (CREON) 36000 UNITS CPEP capsule  TAKE 3 CAPSULES BY MOUTH WITH EACH MEAL AND 2 CAPSULES WITH A SNACK 08/13/20   Hilarie Fredrickson, MD  lisinopril (ZESTRIL) 30 MG tablet Take 1 tablet (30 mg total) by mouth daily. 12/22/20 10/25/21  Georgina Quint, MD  methocarbamol (ROBAXIN) 500 MG tablet Take 1 tablet (500 mg total) by mouth every 6 (six) hours as needed for muscle spasms. 05/24/21   Eldred Manges, MD  ondansetron (ZOFRAN) 4 MG tablet Take 1 tablet (4 mg total) by mouth every 6 (six) hours as needed for nausea or vomiting. 04/14/21   Eldred Manges, MD  pantoprazole (PROTONIX) 40 MG tablet Take 1 tablet (40 mg total) by mouth 2 (two) times daily. Take 30 minutes before breakfast. 07/07/21   Arnaldo Natal, NP  rosuvastatin (CRESTOR) 20 MG tablet Take 1 tablet (20 mg total) by mouth daily. 07/27/21   Georgina Quint, MD    Allergies Aspirin, Hydromorphone hcl, and Latex  Family History  Problem Relation Age of Onset   Cancer Mother 30       pancreatic cancer   Pancreatic cancer Mother    Heart disease Mother    Cancer Father 36       throat cancer   Esophageal cancer Father    Cancer Maternal Aunt 5       breast cancer   Cancer Other        Died  from Colon cancer in 60's   Colon cancer Maternal Uncle    Colon cancer Paternal Uncle    Heart disease Sister    Rectal cancer Neg Hx    Stomach cancer Neg Hx     Social History Social History   Tobacco Use   Smoking status: Some Days    Packs/day: 0.25    Years: 38.00    Pack years: 9.50    Types: Cigarettes   Smokeless tobacco: Never   Tobacco comments:    0-4 cigs/day   Vaping Use   Vaping Use: Never used  Substance Use Topics   Alcohol use: Yes    Alcohol/week: 4.0 standard drinks    Types: 4 Cans of beer per week   Drug use: Not Currently     Review of Systems  Constitutional: No fever/chills Eyes:  No discharge ENT: No upper respiratory complaints. Respiratory: no cough. No SOB/ use of accessory muscles to breath Gastrointestinal:    No nausea, no vomiting.  No diarrhea.  No constipation. Musculoskeletal: Patient has low back pain and left foot pain.  Skin: Negative for rash, abrasions, lacerations, ecchymosis.    ____________________________________________   PHYSICAL EXAM:  VITAL SIGNS: ED Triage Vitals  Enc Vitals Group     BP 09/05/21 1542 126/83     Pulse Rate 09/05/21 1542 76     Resp 09/05/21 1542 17     Temp 09/05/21 1542 98.7 F (37.1 C)     Temp  Source 09/05/21 1542 Oral     SpO2 09/05/21 1542 97 %     Weight --      Height --      Head Circumference --      Peak Flow --      Pain Score 09/05/21 1540 10     Pain Loc --      Pain Edu? --      Excl. in GC? --      Constitutional: Alert and oriented. Well appearing and in no acute distress. Eyes: Conjunctivae are normal. PERRL. EOMI. Head: Atraumatic. ENT:      Nose: No congestion/rhinnorhea.      Mouth/Throat: Mucous membranes are moist.  Neck: No stridor.  No cervical spine tenderness to palpation. Cardiovascular: Normal rate, regular rhythm. Normal S1 and S2.  Good peripheral circulation. Respiratory: Normal respiratory effort without tachypnea or retractions. Lungs CTAB. Good air entry to the bases with no decreased or absent breath sounds Gastrointestinal: Bowel sounds x 4 quadrants. Soft and nontender to palpation. No guarding or rigidity. No distention. Musculoskeletal: Full range of motion to all extremities. No obvious deformities noted Neurologic:  Normal for age. No gross focal neurologic deficits are appreciated.  Skin:  Skin is warm, dry and intact. No rash noted. Psychiatric: Mood and affect are normal for age. Speech and behavior are normal.   ____________________________________________   LABS (all labs ordered are listed, but only abnormal results are displayed)  Labs Reviewed - No data to display ____________________________________________  EKG   ____________________________________________  RADIOLOGY   No  results found.  ____________________________________________    PROCEDURES  Procedure(s) performed:     Procedures     Medications - No data to display   ____________________________________________   INITIAL IMPRESSION / ASSESSMENT AND PLAN / ED COURSE  Pertinent labs & imaging results that were available during my care of the patient were reviewed by me and considered in my medical decision making (see chart for details).    Assessment and plan Foot pain Back pain 61 year old female presents to the urgent care with low back pain and left foot pain requesting a cam boot and Voltaren gel.  Cam boot was given and Voltaren gel was prescribed.  She was advised to follow-up with her primary care provider as needed.      ____________________________________________  FINAL CLINICAL IMPRESSION(S) / ED DIAGNOSES  Final diagnoses:  Pain of joint of left ankle and foot  Acute left-sided low back pain with sciatica, sciatica laterality unspecified      NEW MEDICATIONS STARTED DURING THIS VISIT:  ED Discharge Orders          Ordered    diclofenac Sodium (VOLTAREN) 1 % GEL  4 times daily        09/05/21 1617                This chart was dictated using voice recognition software/Dragon. Despite best efforts to proofread, errors can occur which can change the meaning. Any change was purely unintentional.     Orvil Feil, PA-C 09/05/21 1621

## 2021-09-05 NOTE — ED Triage Notes (Signed)
Pt presents today reporting a long hx of low back pain. Pt reports her back pain has worsened in the last week. She also c/o one week h/o left foot pain. Pain increases with ROM. Notes some swelling. ?No meds taken. ?Appt scheduled with Podiatrist on 3/27, notes h/o spurs. Pt works on her feet and feels like she needs to be seen sooner. Back pain equals foot pain. Denies n/t in BLE. No falls or injuries. ? ? ?

## 2021-09-05 NOTE — Discharge Instructions (Signed)
You can wear boot until seen by podiatry. ?You can apply Voltaren gel up to 4 times daily. ?

## 2021-09-07 ENCOUNTER — Other Ambulatory Visit (HOSPITAL_COMMUNITY): Payer: Self-pay

## 2021-09-07 ENCOUNTER — Ambulatory Visit (INDEPENDENT_AMBULATORY_CARE_PROVIDER_SITE_OTHER): Payer: 59 | Admitting: Emergency Medicine

## 2021-09-07 ENCOUNTER — Encounter: Payer: Self-pay | Admitting: Emergency Medicine

## 2021-09-07 ENCOUNTER — Ambulatory Visit: Payer: 59 | Admitting: Emergency Medicine

## 2021-09-07 ENCOUNTER — Other Ambulatory Visit: Payer: Self-pay

## 2021-09-07 VITALS — BP 124/76 | HR 88 | Temp 98.7°F | Ht <= 58 in | Wt 133.0 lb

## 2021-09-07 DIAGNOSIS — Z8673 Personal history of transient ischemic attack (TIA), and cerebral infarction without residual deficits: Secondary | ICD-10-CM

## 2021-09-07 DIAGNOSIS — G8929 Other chronic pain: Secondary | ICD-10-CM

## 2021-09-07 DIAGNOSIS — M545 Low back pain, unspecified: Secondary | ICD-10-CM | POA: Diagnosis not present

## 2021-09-07 DIAGNOSIS — G894 Chronic pain syndrome: Secondary | ICD-10-CM | POA: Diagnosis not present

## 2021-09-07 DIAGNOSIS — F17200 Nicotine dependence, unspecified, uncomplicated: Secondary | ICD-10-CM

## 2021-09-07 DIAGNOSIS — I10 Essential (primary) hypertension: Secondary | ICD-10-CM

## 2021-09-07 MED ORDER — CYCLOBENZAPRINE HCL 10 MG PO TABS
10.0000 mg | ORAL_TABLET | Freq: Three times a day (TID) | ORAL | 1 refills | Status: DC | PRN
  Filled 2021-09-07: qty 30, 10d supply, fill #0
  Filled 2021-09-23: qty 30, 10d supply, fill #1

## 2021-09-07 NOTE — Assessment & Plan Note (Signed)
Secondary to chronic lumbar pain.  Unsuccessful back surgery regarding pain control.  Needs referral to pain management clinic. ?

## 2021-09-07 NOTE — Assessment & Plan Note (Signed)
Well-controlled hypertension.  Continue lisinopril 30 mg and amlodipine 5 mg daily. ?BP Readings from Last 3 Encounters:  ?09/07/21 124/76  ?09/05/21 126/83  ?08/18/21 130/78  ?Dietary approaches to stop hypertension discussed. ? ?

## 2021-09-07 NOTE — Progress Notes (Signed)
[name] 61 y.o.   Chief Complaint  Patient presents with   Back Pain    Lower back, tailbone Had surgery pain when she moves a certain way, achy pain    HISTORY OF PRESENT ILLNESS: This is a 61 y.o. female complaining of lower back pain. Had recent episode of Planter fasciitis.  Went to urgent care center.  Was placed on a walking boot. Has appointment to see podiatrist on March 27. History of low back surgery last October.  Still has ongoing low back pain.  Feels surgery was unsuccessful.  Saw Dr. Ophelia Charter last 1 month ago. Told to follow-up with PCP regarding chronic pain management. States muscle relaxants help. Has history of hypertension and mini stroke in the past.  HPI   Prior to Admission medications   Medication Sig Start Date End Date Taking? Authorizing Provider  amLODipine (NORVASC) 5 MG tablet Take 1 tablet (5 mg total) by mouth daily. 06/10/21  Yes Faria Casella, [name], MD  diclofenac Sodium (VOLTAREN) 1 % GEL Apply 4 g topically 4 (four) times daily for 7 days. 09/05/21 09/12/21 Yes Pia Mau M, PA-C  Diclofenac Sodium 3 % GEL Apply 1 application topically 3 (three) times daily as needed. 06/09/21  Yes Georgina Quint, MD  lipase/protease/amylase (CREON) 36000 UNITS CPEP capsule TAKE 3 CAPSULES BY MOUTH WITH EACH MEAL AND 2 CAPSULES WITH A SNACK 08/13/20  Yes Hilarie Fredrickson, MD  lisinopril (ZESTRIL) 30 MG tablet Take 1 tablet (30 mg total) by mouth daily. 12/22/20 10/25/21 Yes Lydian Chavous, [name], MD  methocarbamol (ROBAXIN) 500 MG tablet Take 1 tablet (500 mg total) by mouth every 6 (six) hours as needed for muscle spasms. 05/24/21  Yes Eldred Manges, MD  ondansetron (ZOFRAN) 4 MG tablet Take 1 tablet (4 mg total) by mouth every 6 (six) hours as needed for nausea or vomiting. 04/14/21  Yes Eldred Manges, MD  pantoprazole (PROTONIX) 40 MG tablet Take 1 tablet (40 mg total) by mouth 2 (two) times daily. Take 30 minutes before breakfast. 07/07/21  Yes  Kennedy-Smith, Malachi Carl, NP  rosuvastatin (CRESTOR) 20 MG tablet Take 1 tablet (20 mg total) by mouth daily. 07/27/21  Yes Georgina Quint, MD    Allergies  Allergen Reactions   Aspirin Hives   Hydromorphone Hcl Hives and Nausea And Vomiting   Latex Rash    Patient Active Problem List   Diagnosis Date Noted   Status post lumbar spine surgery for decompression of spinal cord 05/24/2021   Dyspepsia 05/23/2021   History of gastroesophageal reflux (GERD) 05/23/2021   History of lumbar laminectomy for spinal cord decompression 04/26/2021   Lumbar stenosis 04/13/2021   Tension headache 12/22/2020   Low back pain 10/20/2020   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/28/2020   Chronic diarrhea 09/14/2017   H/O TIA (transient ischemic attack) and stroke 06/12/2014   Cervical radiculitis 02/12/2014   Essential hypertension, benign 01/24/2010   Microcytic anemia 01/11/2010   Hyperlipemia 05/28/2007   Current smoker 05/28/2007   GERD 05/28/2007    Past Medical History:  Diagnosis Date   Anemia    Arthritis    "neck, hips" (04/25/2018)   Claustrophobia    Daily headache    GERD (gastroesophageal reflux disease)    Hiatal hernia    Hyperlipidemia    "hx" (04/25/2018)   Hypertension    Plantar fasciitis    hx - left foot   Stroke (HCC) 05/2014   "mini stroke" (04/25/2018)  Past Surgical History:  Procedure Laterality Date   ABDOMINAL HYSTERECTOMY  12/09   Secondary to fibroids   ARTERY BIOPSY Right 04/10/2018   Procedure: BIOPSY TEMPORAL ARTERY;  Surgeon: Larina Earthly, MD;  Location: [facility] OR;  Service: Vascular;  Laterality: Right;   CESAREAN SECTION  1986; 1988   COLONOSCOPY     FOOT SURGERY Left 03/2017   Bone Spurs Removed   LUMBAR LAMINECTOMY/DECOMPRESSION MICRODISCECTOMY N/A 04/13/2021   Procedure: LUMBAR FOUR- LUMBAR FIVE AND LUMBAR FIVE-SACRAL ONE  DECOMPRESSION;  Surgeon: Eldred Manges, MD;  Location: [facility] OR;  Service: Orthopedics;  Laterality: N/A;    RADIOLOGY WITH ANESTHESIA N/A 04/26/2018   Procedure: MRI WITH ANESTHESIA;  Surgeon: Radiologist, Medication, MD;  Location: [facility] OR;  Service: Radiology;  Laterality: N/A;   RADIOLOGY WITH ANESTHESIA N/A 08/24/2019   Procedure: MRI WITH ANESTHESIA CERVICAL SPINE;  Surgeon: Radiologist, Medication, MD;  Location: [facility] OR;  Service: Radiology;  Laterality: N/A;   RADIOLOGY WITH ANESTHESIA N/A 10/02/2019   Procedure: MRI WITH ANESTHESIA CERVICAL WITHOUT CONTRAST;  Surgeon: Radiologist, Medication, MD;  Location: [facility] OR;  Service: Radiology;  Laterality: N/A;   TUBAL LIGATION  1988    Social History   Socioeconomic History   Marital status: Divorced    Spouse name: Not on file   Number of children: Not on file   Years of education: Not on file   Highest education level: Not on file  Occupational History   Occupation: unemployed    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   Social Determinants of Health   Financial Resource Strain: Not on file  Food Insecurity: Not on file  Transportation Needs: Not on file  Physical Activity: Not on file  Stress: Not on file  Social Connections: Not on file  Intimate Partner Violence: Not on file    Family History  Problem Relation Age of Onset   Cancer Mother 29       pancreatic cancer   Pancreatic cancer Mother    Heart disease Mother    Cancer Father 64       throat cancer   Esophageal cancer  Father    Cancer Maternal Aunt 32       breast cancer   Cancer Other        Died  from Colon cancer in 60's   Colon cancer Maternal Uncle    Colon cancer Paternal Uncle    Heart disease Sister    Rectal cancer Neg Hx    Stomach cancer Neg Hx      Review of Systems  Constitutional: Negative.  Negative for chills and fever.  HENT:  Negative for congestion and sore throat.   Respiratory: Negative.  Negative for cough and shortness of breath.   Cardiovascular: Negative.  Negative for chest pain and palpitations.  Gastrointestinal: Negative.  Negative for abdominal pain, diarrhea, nausea and vomiting.  Genitourinary: Negative.   Musculoskeletal:  Positive for back pain.  Skin: Negative.  Negative for rash.  Neurological:  Negative for dizziness and headaches.  All other systems reviewed and are negative. Today's Vitals   09/07/21 0943  BP: 124/76  Pulse: 88  Temp: 98.7 F (37.1 C)  TempSrc: Oral  SpO2: 93%  Weight: 133 lb (60.3 kg)  Height: 4\' 9"  (1.448 m)   Body mass index is 28.78 kg/m.   Physical Exam Vitals reviewed.  Constitutional:      Appearance: Normal appearance.  HENT:     Head: Normocephalic.  Eyes:     Extraocular Movements: Extraocular movements intact.     Pupils: Pupils are equal, round, and reactive to light.  Cardiovascular:     Rate and Rhythm: Normal rate and regular rhythm.     Pulses: Normal pulses.     Heart sounds: Normal heart sounds.  Pulmonary:     Effort: Pulmonary effort is normal.     Breath sounds: Normal breath sounds.  Abdominal:     Palpations: Abdomen is soft.     Tenderness: There is no abdominal tenderness.  Musculoskeletal:     Comments: Left lower leg: Walking boot on  Skin:    General: Skin is warm and dry.     Capillary Refill: Capillary refill takes less than 2 seconds.  Neurological:     General: No focal deficit present.     Mental Status: She is alert and oriented to person, place, and time.  Psychiatric:         Mood and Affect: Mood normal.        Behavior: Behavior normal.     ASSESSMENT & PLAN: Problem List Items Addressed This Visit       Cardiovascular and Mediastinum   Essential hypertension, benign (Chronic)    Well-controlled hypertension.  Continue lisinopril 30 mg and amlodipine 5 mg daily. BP Readings from Last 3 Encounters:  09/07/21 124/76  09/05/21 126/83  08/18/21 130/78  Dietary approaches to stop hypertension discussed.         Other   Current smoker    Cancer and cardiovascular risks associated with smoking discussed.  Smoking cessation advice given.      History of stroke   Low back pain - Primary    Chronic and affecting quality of life.  Muscle relaxants help.  We will start Flexeril 10 mg 3 times daily as needed.  Needs referral to pain management clinic.      Relevant Medications   cyclobenzaprine (FLEXERIL) 10 MG tablet   Other Relevant Orders   Ambulatory referral to Pain Clinic   Chronic pain syndrome    Secondary to chronic lumbar pain.  Unsuccessful back surgery regarding pain control.  Needs referral to pain management clinic.      Relevant Medications   cyclobenzaprine (FLEXERIL) 10 MG tablet   Other Relevant Orders   Ambulatory referral to Pain Clinic   Patient Instructions  Chronic Back Pain When back pain lasts longer than 3 months, it is called chronic back pain. Pain may get worse at certain times (flare-ups). There are things you can do at home to manage your pain. Follow these instructions at home: Pay attention to any changes in your symptoms. Take these actions to help with your pain: Managing pain and stiffness   If told, put ice on the painful area.  Your doctor may tell you to use ice for 24-48 hours after the flare-up starts. To do this: Put ice in a plastic bag. Place a towel between your skin and the bag. Leave the ice on for 20 minutes, 2-3 times a day. If told, put heat on the painful area. Do this as often as told by your  doctor. Use the heat source that your doctor recommends, such as a moist heat pack or a heating pad. Place a towel between your skin and the heat source. Leave the heat on for 20-30 minutes. Take off the heat if your skin turns bright red. This is especially important if you are unable to feel pain, heat, or cold. You may have a greater risk of getting burned. Soak in a warm bath. This can help relieve pain. Activity  Avoid bending and other activities that make pain worse. When standing: Keep your upper back and neck straight. Keep your shoulders pulled back. Avoid slouching. When sitting: Keep your back straight. Relax your shoulders. Do not round your shoulders or pull them backward. Do not sit or stand in one place for long periods of time. Take short rest breaks during the day. Lying down or standing is usually better than sitting. Resting can help relieve pain. When sitting or lying down for a long time, do some mild activity or stretching. This will help to prevent stiffness and pain. Get regular exercise. Ask your doctor what activities are safe for you. Do not lift anything that is heavier than 10 lb (4.5 kg) or the limit that you are told, until your doctor says that it is safe. To prevent injury when you lift things: Bend your knees. Keep the weight close to your body. Avoid twisting. Sleep on a firm mattress. Try lying on your side with your knees slightly bent. If you lie on your back, put a pillow under your knees. Medicines Treatment may include medicines for pain and swelling taken by mouth or put on the skin, prescription pain medicine, or muscle relaxants. Take over-the-counter and prescription medicines only as told by your doctor. Ask your doctor if the medicine prescribed to you: Requires you to avoid driving or using machinery. Can cause trouble pooping (constipation). You may need to take these actions to prevent or treat trouble pooping: Drink enough fluid to  keep your pee (urine) pale yellow. Take over-the-counter or prescription medicines. Eat foods that are high in fiber. These include beans, whole grains, and fresh fruits and vegetables. Limit foods that are high in fat and sugars. These include fried or sweet foods. General instructions Do not use any products that contain nicotine or tobacco, such as cigarettes, e-cigarettes, and chewing tobacco. If you need help quitting, ask your doctor. Keep all follow-up visits as told by your doctor. This is important. Contact a doctor if: Your pain does not get better with rest or medicine. Your pain gets worse, or you have new pain. You have a high fever. You lose weight very quickly. You have trouble doing your normal activities. Get help right away if: One or both of your legs or feet feel weak. One or both of your legs or feet lose feeling (have numbness). You have trouble controlling when you poop (have a bowel movement) or pee (urinate). You have bad back pain and: You feel like you may vomit (nauseous), or you vomit. You have pain in your belly (abdomen). You have shortness of breath. You faint. Summary When back pain lasts longer  than 3 months, it is called chronic back pain. Pain may get worse at certain times (flare-ups). Use ice and heat as told by your doctor. Your doctor may tell you to use ice after flare-ups. This information is not intended to replace advice given to you by your health care provider. Make sure you discuss any questions you have with your health care provider. Document Revised: 07/23/2019 Document Reviewed: 07/23/2019 Elsevier Patient Education  2022 Elsevier Inc.     Edwina Barth, MD [facility]

## 2021-09-07 NOTE — Assessment & Plan Note (Signed)
Chronic and affecting quality of life.  Muscle relaxants help.  We will start Flexeril 10 mg 3 times daily as needed.  Needs referral to pain management clinic. ?

## 2021-09-07 NOTE — Patient Instructions (Signed)
Chronic Back Pain When back pain lasts longer than 3 months, it is called chronic back pain. Pain may get worse at certain times (flare-ups). There are things you can do at home to manage your pain. Follow these instructions at home: Pay attention to any changes in your symptoms. Take these actions to help with your pain: Managing pain and stiffness   If told, put ice on the painful area. Your doctor may tell you to use ice for 24-48 hours after the flare-up starts. To do this: Put ice in a plastic bag. Place a towel between your skin and the bag. Leave the ice on for 20 minutes, 2-3 times a day. If told, put heat on the painful area. Do this as often as told by your doctor. Use the heat source that your doctor recommends, such as a moist heat pack or a heating pad. Place a towel between your skin and the heat source. Leave the heat on for 20-30 minutes. Take off the heat if your skin turns bright red. This is especially important if you are unable to feel pain, heat, or cold. You may have a greater risk of getting burned. Soak in a warm bath. This can help relieve pain. Activity  Avoid bending and other activities that make pain worse. When standing: Keep your upper back and neck straight. Keep your shoulders pulled back. Avoid slouching. When sitting: Keep your back straight. Relax your shoulders. Do not round your shoulders or pull them backward. Do not sit or stand in one place for long periods of time. Take short rest breaks during the day. Lying down or standing is usually better than sitting. Resting can help relieve pain. When sitting or lying down for a long time, do some mild activity or stretching. This will help to prevent stiffness and pain. Get regular exercise. Ask your doctor what activities are safe for you. Do not lift anything that is heavier than 10 lb (4.5 kg) or the limit that you are told, until your doctor says that it is safe. To prevent injury when you lift  things: Bend your knees. Keep the weight close to your body. Avoid twisting. Sleep on a firm mattress. Try lying on your side with your knees slightly bent. If you lie on your back, put a pillow under your knees. Medicines Treatment may include medicines for pain and swelling taken by mouth or put on the skin, prescription pain medicine, or muscle relaxants. Take over-the-counter and prescription medicines only as told by your doctor. Ask your doctor if the medicine prescribed to you: Requires you to avoid driving or using machinery. Can cause trouble pooping (constipation). You may need to take these actions to prevent or treat trouble pooping: Drink enough fluid to keep your pee (urine) pale yellow. Take over-the-counter or prescription medicines. Eat foods that are high in fiber. These include beans, whole grains, and fresh fruits and vegetables. Limit foods that are high in fat and sugars. These include fried or sweet foods. General instructions Do not use any products that contain nicotine or tobacco, such as cigarettes, e-cigarettes, and chewing tobacco. If you need help quitting, ask your doctor. Keep all follow-up visits as told by your doctor. This is important. Contact a doctor if: Your pain does not get better with rest or medicine. Your pain gets worse, or you have new pain. You have a high fever. You lose weight very quickly. You have trouble doing your normal activities. Get help right away if: One   or both of your legs or feet feel weak. One or both of your legs or feet lose feeling (have numbness). You have trouble controlling when you poop (have a bowel movement) or pee (urinate). You have bad back pain and: You feel like you may vomit (nauseous), or you vomit. You have pain in your belly (abdomen). You have shortness of breath. You faint. Summary When back pain lasts longer than 3 months, it is called chronic back pain. Pain may get worse at certain times  (flare-ups). Use ice and heat as told by your doctor. Your doctor may tell you to use ice after flare-ups. This information is not intended to replace advice given to you by your health care provider. Make sure you discuss any questions you have with your health care provider. Document Revised: 07/23/2019 Document Reviewed: 07/23/2019 Elsevier Patient Education  2022 Elsevier Inc.  

## 2021-09-07 NOTE — Assessment & Plan Note (Signed)
Cancer and cardiovascular risks associated with smoking discussed.  Smoking cessation advice given. 

## 2021-09-09 ENCOUNTER — Inpatient Hospital Stay: Payer: 59 | Attending: Physician Assistant | Admitting: Physician Assistant

## 2021-09-09 ENCOUNTER — Inpatient Hospital Stay: Payer: 59

## 2021-09-09 DIAGNOSIS — I10 Essential (primary) hypertension: Secondary | ICD-10-CM | POA: Insufficient documentation

## 2021-09-09 DIAGNOSIS — K219 Gastro-esophageal reflux disease without esophagitis: Secondary | ICD-10-CM | POA: Insufficient documentation

## 2021-09-09 DIAGNOSIS — Z8673 Personal history of transient ischemic attack (TIA), and cerebral infarction without residual deficits: Secondary | ICD-10-CM | POA: Insufficient documentation

## 2021-09-09 DIAGNOSIS — R197 Diarrhea, unspecified: Secondary | ICD-10-CM | POA: Insufficient documentation

## 2021-09-09 DIAGNOSIS — R7989 Other specified abnormal findings of blood chemistry: Secondary | ICD-10-CM | POA: Insufficient documentation

## 2021-09-09 DIAGNOSIS — F17210 Nicotine dependence, cigarettes, uncomplicated: Secondary | ICD-10-CM | POA: Insufficient documentation

## 2021-09-09 DIAGNOSIS — Z79899 Other long term (current) drug therapy: Secondary | ICD-10-CM | POA: Insufficient documentation

## 2021-09-09 DIAGNOSIS — D509 Iron deficiency anemia, unspecified: Secondary | ICD-10-CM | POA: Insufficient documentation

## 2021-09-09 DIAGNOSIS — G894 Chronic pain syndrome: Secondary | ICD-10-CM | POA: Insufficient documentation

## 2021-09-13 ENCOUNTER — Telehealth: Payer: Self-pay | Admitting: Physician Assistant

## 2021-09-13 NOTE — Telephone Encounter (Signed)
R/s pt's new hem appt. Pt was sick last week. Pt is aware of new appt date and time.  ?

## 2021-09-14 ENCOUNTER — Encounter: Payer: Self-pay | Admitting: Physical Medicine and Rehabilitation

## 2021-09-15 ENCOUNTER — Other Ambulatory Visit: Payer: Self-pay

## 2021-09-15 DIAGNOSIS — D509 Iron deficiency anemia, unspecified: Secondary | ICD-10-CM

## 2021-09-16 ENCOUNTER — Other Ambulatory Visit: Payer: Self-pay | Admitting: Physician Assistant

## 2021-09-16 DIAGNOSIS — D509 Iron deficiency anemia, unspecified: Secondary | ICD-10-CM

## 2021-09-16 NOTE — Progress Notes (Signed)
? ? [place] ?[phone]   [phone] ? ?CONSULT NOTE ? ?REFERRING PHYSICIAN: Carl Best ? ?REASON FOR CONSULTATION:  ?Microcytic anemia  ? ?HPI ?[name] is a 61 y.o. female with a past medical history of hypertension, GERD, chronic diarrhea, elevated LFTs, uterine fibroids status post hysterectomy in 2009, chronic pain syndrome, and pancreatic insufficiency is referred to the clinic for microcytic anemia.  ? ?The patient recently saw her gastroenterologist, Dr. Henrene Pastor, on 08/18/2021.  She was seen in for worsening reflux symptoms.  She also had elevated LFTs. She underwent an EGD on 08/18/21 which showed normal stomach, normal esophagus, normal duodenum, and normal cardia/gastric fundus.  They noted that they may consider small bowel capsule endoscopy if the EGD is unrevealing for the microcytic anemia.  The patient had iron studies performed that day which showed a normal iron at 110, normal TIBC at 354, and normal saturation at 31%, ferritin at 58 which is within normal limits.  The patient was referred to clinic for further evaluation and additional work-up for her microcytic anemia despite normal iron levels. ? ?The oldest records available to me are from 2008.  She had anemia at this time.  To her knowledge, she started having anemia when she was "a little girl".  The patient reports that receiving a blood transfusion once before.  She reports she used to be iron deficient when she used to have heavy menstrual bleeding and was on an oral iron supplement.  This was discontinued due to significant constipation.  She underwent hysterectomy for uterine fibroids/heavy menstrual bleeding in 2009. ? ?Regarding symptoms, she reports fatigue some days more than others.  She denies any lightheadedness or dyspnea on exertion.  She denies any palpitations.  She denies any vaginal bleeding as she is status post a hysterectomy from 2009 due to fibroids.  Her last  colonoscopy was in 2021 which showed one 3 mm polyp and small internal hemorrhoids.  She reports her last episode of rectal bleeding was several years ago.  She denies epistaxis, gingival bleeding, hemoptysis, hematemesis, melena, or hematochezia.  Denies any chills, night sweats, unexplained weight loss, or lymphadenopathy. Denies any NSAID use.  Denies any chronic kidney disease.  She denies any particular dietary habits such as being a vegan or vegetarian.  Denies any history of any bariatric surgery.  She reports she sometimes craves ice chips.  ? ?The patient reports that all the women on her mom side of the family are all anemic.  The patient is unsure if she has a family history of thalassemia, sickle cell, or sickle cell trait.  The patient denies any family history of any hematologic malignancies.  The patient's mother had pancreatic cancer and hypertension.  The patient's father had lung cancer. ? 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  She is single and has 2 children.  She smoked approximately 3 to 4 cigarettes a day for over 20 years.  She estimates she drinks approximately 3 alcoholic beverages per week.  She denies any drug use. ? ? ?HPI ? ?Past Medical History:  ?Diagnosis Date  ? Anemia   ? Arthritis   ? "neck, hips" (04/25/2018)  ? Claustrophobia   ? Daily headache   ? GERD (gastroesophageal reflux disease)   ? Hiatal hernia   ? Hyperlipidemia   ? "hx" (04/25/2018)  ? Hypertension   ? Plantar fasciitis   ? hx - left foot  ? Stroke [facility]) 05/2014  ? "  mini stroke" (04/25/2018)  ? ? ?Past Surgical History:  ?Procedure Laterality Date  ? ABDOMINAL HYSTERECTOMY  12/09  ? Secondary to fibroids  ? ARTERY BIOPSY Right 04/10/2018  ? Procedure: BIOPSY TEMPORAL ARTERY;  Surgeon: Rosetta Posner, MD;  Location: [place];  Service: Vascular;  Laterality: Right;  ? [place]; 1988  ? COLONOSCOPY    ? FOOT SURGERY Left 03/2017  ? Bone Spurs Removed  ? LUMBAR  LAMINECTOMY/DECOMPRESSION MICRODISCECTOMY N/A 04/13/2021  ? Procedure: LUMBAR FOUR- LUMBAR FIVE AND LUMBAR FIVE-SACRAL ONE  DECOMPRESSION;  Surgeon: Marybelle Killings, MD;  Location: [place];  Service: Orthopedics;  Laterality: N/A;  ? RADIOLOGY WITH ANESTHESIA N/A 04/26/2018  ? Procedure: MRI WITH ANESTHESIA;  Surgeon: Radiologist, Medication, MD;  Location: [place];  Service: Radiology;  Laterality: N/A;  ? RADIOLOGY WITH ANESTHESIA N/A 08/24/2019  ? Procedure: MRI WITH ANESTHESIA CERVICAL SPINE;  Surgeon: Radiologist, Medication, MD;  Location: [place];  Service: Radiology;  Laterality: N/A;  ? RADIOLOGY WITH ANESTHESIA N/A 10/02/2019  ? Procedure: MRI WITH ANESTHESIA CERVICAL WITHOUT CONTRAST;  Surgeon: Radiologist, Medication, MD;  Location: [place];  Service: Radiology;  Laterality: N/A;  ? TUBAL LIGATION  1988  ? ? ?Family History  ?Problem Relation Age of Onset  ? Cancer Mother 65  ?     pancreatic cancer  ? Pancreatic cancer Mother   ? Heart disease Mother   ? Cancer Father 56  ?     throat cancer  ? Esophageal cancer Father   ? Cancer Maternal Aunt 60  ?     breast cancer  ? Cancer Other   ?     Died  from Colon cancer in 60's  ? Colon cancer Maternal Uncle   ? Colon cancer Paternal Uncle   ? Heart disease Sister   ? Rectal cancer Neg Hx   ? Stomach cancer Neg Hx   ? ? ?Social History ?Social History  ? ?Tobacco Use  ? Smoking status: Some Days  ?  Packs/day: 0.25  ?  Years: 38.00  ?  Pack years: 9.50  ?  Types: Cigarettes  ? Smokeless tobacco: Never  ? Tobacco comments:  ?  0-4 cigs/day   ?Vaping Use  ? Vaping Use: Never used  ?Substance Use Topics  ? Alcohol use: Yes  ?  Alcohol/week: 4.0 standard drinks  ?  Types: 4 Cans of beer per week  ? Drug use: Not Currently  ? ? ?Allergies  ?Allergen Reactions  ? Aspirin Hives  ? Hydromorphone Hcl Hives and Nausea And Vomiting  ? Latex Rash  ? ? ?Current Outpatient Medications  ?Medication Sig Dispense Refill  ? folic acid (FOLVITE) 1 MG tablet Take 1 tablet (1 mg total) by  mouth daily. 30 tablet 2  ? potassium chloride SA (KLOR-CON M) 20 MEQ tablet Take 1 tablet (20 mEq total) by mouth daily. 6 tablet 0  ? amLODipine (NORVASC) 5 MG tablet Take 1 tablet (5 mg total) by mouth daily. 90 tablet 3  ? cyclobenzaprine (FLEXERIL) 10 MG tablet Take 1 tablet (10 mg total) by mouth 3 (three) times daily as needed for muscle spasms. 30 tablet 1  ? Diclofenac Sodium 3 % GEL Apply 1 application topically 3 (three) times daily as needed. 1001 g 1  ? lipase/protease/amylase (CREON) 36000 UNITS CPEP capsule TAKE 3 CAPSULES BY MOUTH WITH EACH MEAL AND 2 CAPSULES WITH A SNACK 330 capsule 6  ? lisinopril (ZESTRIL) 30 MG tablet Take 1  tablet (30 mg total) by mouth daily. 90 tablet 3  ? ondansetron (ZOFRAN) 4 MG tablet Take 1 tablet (4 mg total) by mouth every 6 (six) hours as needed for nausea or vomiting. 20 tablet 0  ? pantoprazole (PROTONIX) 40 MG tablet Take 1 tablet (40 mg total) by mouth 2 (two) times daily. Take 30 minutes before breakfast. 60 tablet 1  ? predniSONE (DELTASONE) 10 MG tablet Taper dose follow enclosed instructions 48 tablet 0  ? rosuvastatin (CRESTOR) 20 MG tablet Take 1 tablet (20 mg total) by mouth daily. 90 tablet 3  ? ?No current facility-administered medications for this visit.  ? ? ?REVIEW OF SYSTEMS:   ?Review of Systems  ?Constitutional: Positive for intermittent fatigue.  Negative for appetite change, chills, fever, and unexpected weight change.  ?HENT: Negative for mouth sores, nosebleeds, sore throat and trouble swallowing.   ?Eyes: Negative for eye problems and icterus.  ?Respiratory: Negative for cough, hemoptysis, shortness of breath and wheezing.   ?Cardiovascular: Negative for chest pain and leg swelling.  ?Gastrointestinal: Negative for abdominal pain, constipation, diarrhea, nausea and vomiting.  ?Genitourinary: Negative for bladder incontinence, difficulty urinating, dysuria, frequency and hematuria.   ?Musculoskeletal: Negative for back pain, gait problem, neck  pain and neck stiffness.  ?Skin: Negative for itching and rash.  ?Neurological: Negative for dizziness, extremity weakness, gait problem, headaches, light-headedness and seizures.  ?Hematological: Negative for adenopathy. Doe

## 2021-09-19 ENCOUNTER — Other Ambulatory Visit: Payer: Self-pay

## 2021-09-19 ENCOUNTER — Inpatient Hospital Stay: Payer: 59

## 2021-09-19 ENCOUNTER — Encounter: Payer: Self-pay | Admitting: Podiatry

## 2021-09-19 ENCOUNTER — Ambulatory Visit (INDEPENDENT_AMBULATORY_CARE_PROVIDER_SITE_OTHER): Payer: 59

## 2021-09-19 ENCOUNTER — Inpatient Hospital Stay: Payer: 59 | Admitting: Physician Assistant

## 2021-09-19 ENCOUNTER — Ambulatory Visit: Payer: 59 | Admitting: Podiatry

## 2021-09-19 ENCOUNTER — Other Ambulatory Visit (HOSPITAL_COMMUNITY): Payer: Self-pay

## 2021-09-19 VITALS — BP 125/85 | HR 85 | Temp 98.1°F | Resp 18 | Ht <= 58 in | Wt 138.4 lb

## 2021-09-19 DIAGNOSIS — M7732 Calcaneal spur, left foot: Secondary | ICD-10-CM

## 2021-09-19 DIAGNOSIS — K219 Gastro-esophageal reflux disease without esophagitis: Secondary | ICD-10-CM | POA: Diagnosis not present

## 2021-09-19 DIAGNOSIS — F17210 Nicotine dependence, cigarettes, uncomplicated: Secondary | ICD-10-CM | POA: Diagnosis not present

## 2021-09-19 DIAGNOSIS — I10 Essential (primary) hypertension: Secondary | ICD-10-CM

## 2021-09-19 DIAGNOSIS — G894 Chronic pain syndrome: Secondary | ICD-10-CM | POA: Diagnosis not present

## 2021-09-19 DIAGNOSIS — Z79899 Other long term (current) drug therapy: Secondary | ICD-10-CM | POA: Diagnosis not present

## 2021-09-19 DIAGNOSIS — M7752 Other enthesopathy of left foot: Secondary | ICD-10-CM | POA: Diagnosis not present

## 2021-09-19 DIAGNOSIS — E876 Hypokalemia: Secondary | ICD-10-CM

## 2021-09-19 DIAGNOSIS — D509 Iron deficiency anemia, unspecified: Secondary | ICD-10-CM

## 2021-09-19 DIAGNOSIS — K8689 Other specified diseases of pancreas: Secondary | ICD-10-CM

## 2021-09-19 DIAGNOSIS — Z720 Tobacco use: Secondary | ICD-10-CM

## 2021-09-19 DIAGNOSIS — Z8673 Personal history of transient ischemic attack (TIA), and cerebral infarction without residual deficits: Secondary | ICD-10-CM | POA: Diagnosis not present

## 2021-09-19 DIAGNOSIS — E538 Deficiency of other specified B group vitamins: Secondary | ICD-10-CM | POA: Insufficient documentation

## 2021-09-19 DIAGNOSIS — M722 Plantar fascial fibromatosis: Secondary | ICD-10-CM

## 2021-09-19 DIAGNOSIS — R5383 Other fatigue: Secondary | ICD-10-CM

## 2021-09-19 DIAGNOSIS — R7989 Other specified abnormal findings of blood chemistry: Secondary | ICD-10-CM | POA: Diagnosis not present

## 2021-09-19 DIAGNOSIS — R197 Diarrhea, unspecified: Secondary | ICD-10-CM | POA: Diagnosis not present

## 2021-09-19 LAB — CBC WITH DIFFERENTIAL (CANCER CENTER ONLY)
Abs Immature Granulocytes: 0.01 10*3/uL (ref 0.00–0.07)
Basophils Absolute: 0 10*3/uL (ref 0.0–0.1)
Basophils Relative: 1 %
Eosinophils Absolute: 0.1 10*3/uL (ref 0.0–0.5)
Eosinophils Relative: 2 %
HCT: 34.4 % — ABNORMAL LOW (ref 36.0–46.0)
Hemoglobin: 10.9 g/dL — ABNORMAL LOW (ref 12.0–15.0)
Immature Granulocytes: 0 %
Lymphocytes Relative: 51 %
Lymphs Abs: 3.4 10*3/uL (ref 0.7–4.0)
MCH: 21.8 pg — ABNORMAL LOW (ref 26.0–34.0)
MCHC: 31.7 g/dL (ref 30.0–36.0)
MCV: 68.7 fL — ABNORMAL LOW (ref 80.0–100.0)
Monocytes Absolute: 0.6 10*3/uL (ref 0.1–1.0)
Monocytes Relative: 10 %
Neutro Abs: 2.4 10*3/uL (ref 1.7–7.7)
Neutrophils Relative %: 36 %
Platelet Count: 320 10*3/uL (ref 150–400)
RBC: 5.01 MIL/uL (ref 3.87–5.11)
RDW: 15.1 % (ref 11.5–15.5)
WBC Count: 6.6 10*3/uL (ref 4.0–10.5)
nRBC: 0 % (ref 0.0–0.2)

## 2021-09-19 LAB — CMP (CANCER CENTER ONLY)
ALT: 78 U/L — ABNORMAL HIGH (ref 0–44)
AST: 62 U/L — ABNORMAL HIGH (ref 15–41)
Albumin: 4.2 g/dL (ref 3.5–5.0)
Alkaline Phosphatase: 96 U/L (ref 38–126)
Anion gap: 11 (ref 5–15)
BUN: 12 mg/dL (ref 6–20)
CO2: 27 mmol/L (ref 22–32)
Calcium: 8.8 mg/dL — ABNORMAL LOW (ref 8.9–10.3)
Chloride: 106 mmol/L (ref 98–111)
Creatinine: 0.54 mg/dL (ref 0.44–1.00)
GFR, Estimated: >60 mL/min (ref >60)
Glucose, Bld: 64 mg/dL — ABNORMAL LOW (ref 70–99)
Potassium: 3.1 mmol/L — ABNORMAL LOW (ref 3.5–5.1)
Sodium: 144 mmol/L (ref 135–145)
Total Bilirubin: 0.3 mg/dL (ref 0.3–1.2)
Total Protein: 7.4 g/dL (ref 6.5–8.1)

## 2021-09-19 LAB — VITAMIN B12: Vitamin B-12: 395 pg/mL (ref 180–914)

## 2021-09-19 LAB — FERRITIN: Ferritin: 96 ng/mL (ref 11–307)

## 2021-09-19 LAB — IRON AND IRON BINDING CAPACITY (CC-WL,HP ONLY)
Iron: 105 ug/dL (ref 28–170)
Saturation Ratios: 25 % (ref 10.4–31.8)
TIBC: 426 ug/dL (ref 250–450)
UIBC: 321 ug/dL (ref 148–442)

## 2021-09-19 LAB — TSH: TSH: 0.519 u[IU]/mL (ref 0.308–3.960)

## 2021-09-19 LAB — FOLATE: Folate: 2.1 ng/mL — ABNORMAL LOW (ref >5.9)

## 2021-09-19 MED ORDER — POTASSIUM CHLORIDE CRYS ER 20 MEQ PO TBCR
20.0000 meq | EXTENDED_RELEASE_TABLET | Freq: Every day | ORAL | 0 refills | Status: DC
  Filled 2021-09-19: qty 6, 6d supply, fill #0

## 2021-09-19 MED ORDER — FOLIC ACID 1 MG PO TABS
1.0000 mg | ORAL_TABLET | Freq: Every day | ORAL | 2 refills | Status: DC
  Filled 2021-09-19: qty 30, 30d supply, fill #0
  Filled 2021-10-31: qty 30, 30d supply, fill #1
  Filled 2021-12-13: qty 30, 30d supply, fill #2

## 2021-09-19 MED ORDER — PREDNISONE 10 MG PO TABS
ORAL_TABLET | ORAL | 0 refills | Status: DC
  Filled 2021-09-19 (×2): qty 48, 12d supply, fill #0

## 2021-09-19 NOTE — Progress Notes (Signed)
Subjective:  ? ?Patient ID: [name], female   DOB: 61 y.o.   MRN: [id number]  ? ?HPI ?Patient states she has had a lot of pain in her left ankle her left heel and into the midfoot.  States it is hard for her to walk and it is gradually becoming more aggravating and she does not remember specific injury.  She did go to urgent care and they were not able to help her and does not smoke likes to be active if possible ? ? ?Review of Systems  ?All other systems reviewed and are negative. ? ? ?   ?Objective:  ?Physical Exam ?Vitals and nursing note reviewed.  ?Constitutional:   ?   Appearance: She is well-developed.  ?Pulmonary:  ?   Effort: Pulmonary effort is normal.  ?Musculoskeletal:     ?   General: Normal range of motion.  ?Skin: ?   General: Skin is warm.  ?Neurological:  ?   Mental Status: She is alert.  ?  ?Neurovascular status intact muscle strength adequate range of motion within normal limits with inflammation pain of the left sinus tarsi plantar fascia midfoot area with increased erythema in the left foot but no open areas no indications of proximal edema erythema or drainage noted.  Right foot mildly tender not to the same degree with good digital perfusion well oriented ? ?   ?Assessment:  ?Appears to be inflammatory with difficulty to say as to whether or not this is systemic or local with patient not able to bear good pressure on her left foot or ankle ? ?   ?Plan:  ?H&P reviewed condition and I am changing out her air fracture walker for a shorter lighter one that she will be able to tolerate better.  She had this fitted today and it feels much better for her and I went ahead today and I did place her on a Sterapred 12-day DS Dosepak to try to reduce the inflammatory cycle and I advised her on reduced activity and reappoint again in 3 weeks or earlier if needed ? ?X-rays were negative for signs of fracture or acute arthritis with flatfoot deformity noted left ?   ? ? ?

## 2021-09-21 LAB — PROTEIN ELECTROPHORESIS, SERUM, WITH REFLEX
A/G Ratio: 1.2 (ref 0.7–1.7)
Albumin ELP: 3.8 g/dL (ref 2.9–4.4)
Alpha-1-Globulin: 0.2 g/dL (ref 0.0–0.4)
Alpha-2-Globulin: 1 g/dL (ref 0.4–1.0)
Beta Globulin: 1.2 g/dL (ref 0.7–1.3)
Gamma Globulin: 0.8 g/dL (ref 0.4–1.8)
Globulin, Total: 3.2 g/dL (ref 2.2–3.9)
Total Protein ELP: 7 g/dL (ref 6.0–8.5)

## 2021-09-21 LAB — HGB FRACTIONATION CASCADE
Hgb A2: 2.3 % (ref 1.8–3.2)
Hgb A: 97.7 % (ref 96.4–98.8)
Hgb F: 0 % (ref 0.0–2.0)
Hgb S: 0 %

## 2021-09-22 LAB — HEAVY METALS, BLOOD
Arsenic: 2 ug/L (ref 0–9)
Lead: 1.8 ug/dL (ref 0.0–3.4)
Mercury: <1 ug/L (ref 0.0–14.9)

## 2021-09-23 ENCOUNTER — Telehealth: Payer: Self-pay

## 2021-09-23 ENCOUNTER — Other Ambulatory Visit (HOSPITAL_COMMUNITY): Payer: Self-pay

## 2021-09-23 ENCOUNTER — Other Ambulatory Visit: Payer: Self-pay | Admitting: Internal Medicine

## 2021-09-23 ENCOUNTER — Other Ambulatory Visit: Payer: Self-pay

## 2021-09-23 NOTE — Telephone Encounter (Signed)
-----   Message from Gracelyn Nurse, PA-C sent at 09/22/2021  2:54 PM EDT ----- ?Can you let her know the rest of her labs look normal. Just the folic acid was low and I sent a prescription to her pharmacy. I tried to call her to tell her this but was unable to reach her and she does not have a voicemail set up. Dr. Julien Nordmann would like labs and a follow up in 2 months to see if taking the folic acid helps her anemia. Can you send a scheduling message for 2 months and let her know her other labs were ok?  ?----- Message ----- ?From: Interface, Lab In [place] ?Sent: 09/19/2021   1:22 PM EDT ?To: Cassandra L Heilingoetter, PA-C ? ? ?

## 2021-09-23 NOTE — Telephone Encounter (Signed)
I spoke with pt and advised as indicated. A schedule message was also sent and pt is aware you expect a call from scheduling. ?

## 2021-09-26 ENCOUNTER — Telehealth: Payer: Self-pay | Admitting: Internal Medicine

## 2021-09-26 ENCOUNTER — Other Ambulatory Visit (HOSPITAL_COMMUNITY): Payer: Self-pay

## 2021-09-26 MED ORDER — PANCRELIPASE (LIP-PROT-AMYL) 36000-114000 UNITS PO CPEP
ORAL_CAPSULE | ORAL | 6 refills | Status: DC
  Filled 2021-09-26: qty 330, 25d supply, fill #0
  Filled 2021-12-13: qty 330, 25d supply, fill #1
  Filled 2022-01-02 – 2022-03-01 (×2): qty 330, 25d supply, fill #2
  Filled 2022-03-03: qty 300, 25d supply, fill #2

## 2021-09-26 NOTE — Telephone Encounter (Signed)
Per 3/31 inbasket.  Spoke with pt.  Appointments were confirmed.   ?

## 2021-09-27 ENCOUNTER — Other Ambulatory Visit: Payer: Self-pay

## 2021-09-27 ENCOUNTER — Other Ambulatory Visit (HOSPITAL_COMMUNITY): Payer: Self-pay

## 2021-09-29 ENCOUNTER — Other Ambulatory Visit (HOSPITAL_COMMUNITY): Payer: Self-pay

## 2021-09-29 ENCOUNTER — Other Ambulatory Visit: Payer: Self-pay

## 2021-09-30 ENCOUNTER — Other Ambulatory Visit (HOSPITAL_COMMUNITY): Payer: Self-pay

## 2021-10-03 ENCOUNTER — Ambulatory Visit: Payer: 59 | Admitting: Podiatry

## 2021-10-03 ENCOUNTER — Other Ambulatory Visit (HOSPITAL_COMMUNITY): Payer: Self-pay

## 2021-10-11 ENCOUNTER — Ambulatory Visit: Payer: 59 | Admitting: Emergency Medicine

## 2021-10-19 ENCOUNTER — Telehealth: Payer: Self-pay | Admitting: Internal Medicine

## 2021-10-19 NOTE — Telephone Encounter (Signed)
Called patient regarding upcoming June appointment, patient is notified. 

## 2021-10-20 ENCOUNTER — Encounter: Payer: Self-pay | Admitting: Emergency Medicine

## 2021-10-20 ENCOUNTER — Other Ambulatory Visit (HOSPITAL_COMMUNITY): Payer: Self-pay

## 2021-10-20 ENCOUNTER — Ambulatory Visit (INDEPENDENT_AMBULATORY_CARE_PROVIDER_SITE_OTHER): Payer: 59 | Admitting: Emergency Medicine

## 2021-10-20 VITALS — BP 120/84 | HR 91 | Temp 98.7°F | Ht <= 58 in | Wt 136.2 lb

## 2021-10-20 DIAGNOSIS — D509 Iron deficiency anemia, unspecified: Secondary | ICD-10-CM

## 2021-10-20 DIAGNOSIS — Z833 Family history of diabetes mellitus: Secondary | ICD-10-CM

## 2021-10-20 DIAGNOSIS — K219 Gastro-esophageal reflux disease without esophagitis: Secondary | ICD-10-CM

## 2021-10-20 DIAGNOSIS — F17200 Nicotine dependence, unspecified, uncomplicated: Secondary | ICD-10-CM

## 2021-10-20 DIAGNOSIS — G894 Chronic pain syndrome: Secondary | ICD-10-CM

## 2021-10-20 DIAGNOSIS — Z8719 Personal history of other diseases of the digestive system: Secondary | ICD-10-CM | POA: Diagnosis not present

## 2021-10-20 DIAGNOSIS — I10 Essential (primary) hypertension: Secondary | ICD-10-CM | POA: Diagnosis not present

## 2021-10-20 DIAGNOSIS — E538 Deficiency of other specified B group vitamins: Secondary | ICD-10-CM

## 2021-10-20 DIAGNOSIS — G8929 Other chronic pain: Secondary | ICD-10-CM

## 2021-10-20 DIAGNOSIS — E785 Hyperlipidemia, unspecified: Secondary | ICD-10-CM | POA: Diagnosis not present

## 2021-10-20 DIAGNOSIS — M79673 Pain in unspecified foot: Secondary | ICD-10-CM

## 2021-10-20 DIAGNOSIS — D649 Anemia, unspecified: Secondary | ICD-10-CM

## 2021-10-20 LAB — COMPREHENSIVE METABOLIC PANEL
ALT: 79 U/L — ABNORMAL HIGH (ref 0–35)
AST: 76 U/L — ABNORMAL HIGH (ref 0–37)
Albumin: 4.3 g/dL (ref 3.5–5.2)
Alkaline Phosphatase: 84 U/L (ref 39–117)
BUN: 12 mg/dL (ref 6–23)
CO2: 28 mEq/L (ref 19–32)
Calcium: 9.1 mg/dL (ref 8.4–10.5)
Chloride: 104 mEq/L (ref 96–112)
Creatinine, Ser: 0.53 mg/dL (ref 0.40–1.20)
GFR: 100.76 mL/min (ref >60.00)
Glucose, Bld: 87 mg/dL (ref 70–99)
Potassium: 4 mEq/L (ref 3.5–5.1)
Sodium: 142 mEq/L (ref 135–145)
Total Bilirubin: 0.3 mg/dL (ref 0.2–1.2)
Total Protein: 7.3 g/dL (ref 6.0–8.3)

## 2021-10-20 LAB — LIPID PANEL
Cholesterol: 125 mg/dL (ref 0–200)
HDL: 53 mg/dL (ref >39.00)
LDL Cholesterol: 43 mg/dL (ref 0–99)
NonHDL: 71.71
Total CHOL/HDL Ratio: 2
Triglycerides: 146 mg/dL (ref 0.0–149.0)
VLDL: 29.2 mg/dL (ref 0.0–40.0)

## 2021-10-20 LAB — HEMOGLOBIN A1C: Hgb A1c MFr Bld: 5.9 % (ref 4.6–6.5)

## 2021-10-20 MED ORDER — LISINOPRIL 30 MG PO TABS
30.0000 mg | ORAL_TABLET | Freq: Every day | ORAL | 3 refills | Status: DC
  Filled 2021-10-20: qty 90, 90d supply, fill #0
  Filled 2022-03-01: qty 30, 30d supply, fill #1
  Filled 2022-04-10: qty 30, 30d supply, fill #2

## 2021-10-20 NOTE — Progress Notes (Signed)
[name] ?61 y.o. ? ? ?Chief Complaint  ?Patient presents with  ? Foot Pain  ?  Bil foot pain   ? ? ?HISTORY OF PRESENT ILLNESS: ?This is a 61 y.o. female interested in having glucose and hemoglobin A1c tested.  Strong family history of diabetes. ?Has history of chronic bilateral foot pain.  Recently seen by podiatrist with the following assessment and plan: ?   ?Assessment:  ?Appears to be inflammatory with difficulty to say as to whether or not this is systemic or local with patient not able to bear good pressure on her left foot or ankle ?  ?   ?Plan:  ?H&P reviewed condition and I am changing out her air fracture walker for a shorter lighter one that she will be able to tolerate better.  She had this fitted today and it feels much better for her and I went ahead today and I did place her on a Sterapred 12-day DS Dosepak to try to reduce the inflammatory cycle and I advised her on reduced activity and reappoint again in 3 weeks or earlier if needed ? ?X-rays were negative for signs of fracture or acute arthritis with flatfoot deformity noted left ?   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  Assessment and plan by Dr. Earlie Server as follows: ?ADDENDUM: ?Hematology/Oncology Attending: ?I had a face-to-face encounter with the patient today.  I reviewed her record, lab and recommended her care plan.  This is a very pleasant 61 years old African-American female with history of hypertension, GERD, uterine fibroid status post hysterectomy in 2009, chronic pain syndrome, chronic diarrhea secondary to pancreatic insufficiency.  The patient also has a history of anemia that has been going on for long time.  She had gastrointestinal work-up including EGD and February 2023 that showed no concerning findings.  She also had previous colonoscopy few years ago that was unremarkable. ?Her iron study that were performed recently showed normal serum  iron as well as ferritin level.  The patient continues to have the microcytic anemia and she was referred to [place] today for further evaluation and management of her condition.  She continues to have some bleeding from hemorrhoids. ?When seen today she is feeling fine except for mild fatigue. ?We will order several studies today including repeat CBC that showed hemoglobin of 10.9 and hematocrit 34.4% with MCV of 68.7.  Her ferritin level was normal at 2.1 at 395 but serum folate was low.  The patient has normal ferritin level of 96.  Serum iron 105, iron saturation 25%. ?We have a concern that the patient may have some form of hemoglobinopathy like sickle cell or thalassemia.  We will order hemoglobin electrophoresis.  We will also order blood for heavy metals.  We will give her prescription for folic acid 1 mg p.o. daily ?We will call the patient with the result and recommendation depending on the pending lab results. ?She was advised to call if she has any other concerning symptoms in the interval. ?The total time spent in the appointment was 60 minutes. ?Disclaimer: This note was dictated with voice recognition software. Similar sounding words can inadvertently be transcribed and may be missed upon review. ?[name], MD ?09/19/21 ?  ?No other complaints or medical concerns today. ? ?Foot Pain ?Pertinent negatives include no abdominal pain, chest pain, fever, headaches, nausea or vomiting.  ? ? ?Prior to Admission medications   ?Medication Sig Start  Date End Date Taking? Authorizing Provider  ?amLODipine (NORVASC) 5 MG tablet Take 1 tablet (5 mg total) by mouth daily. 06/10/21  Yes Misako Roeder, Ines Bloomer, MD  ?Diclofenac Sodium 3 % GEL Apply 1 application topically 3 (three) times daily as needed. 06/09/21  Yes Sherwin Hollingshed, Ines Bloomer, MD  ?folic acid (FOLVITE) 1 MG tablet Take 1 tablet (1 mg total) by mouth daily. 09/19/21  Yes Heilingoetter, Cassandra L, PA-C  ?lipase/protease/amylase (CREON) 36000 UNITS CPEP  capsule Take 3 capsules (108,000 Units total) by mouth 3 (three) times daily with meals AND 2 capsules (72,000 Units total) with snacks. 09/26/21  Yes Irene Shipper, MD  ?lisinopril (ZESTRIL) 30 MG tablet Take 1 tablet (30 mg total) by mouth daily. 12/22/20 10/25/21 Yes Sharmeka Palmisano, Ines Bloomer, MD  ?pantoprazole (PROTONIX) 40 MG tablet Take 1 tablet (40 mg total) by mouth 2 (two) times daily. Take 30 minutes before breakfast. 07/07/21  Yes Noralyn Pick, NP  ?potassium chloride SA (KLOR-CON M) 20 MEQ tablet Take 1 tablet (20 mEq total) by mouth daily. 09/19/21  Yes Heilingoetter, Cassandra L, PA-C  ?rosuvastatin (CRESTOR) 20 MG tablet Take 1 tablet (20 mg total) by mouth daily. 07/27/21  Yes Daylyn Christine, Ines Bloomer, MD  ?cyclobenzaprine (FLEXERIL) 10 MG tablet Take 1 tablet (10 mg total) by mouth 3 (three) times daily as needed for muscle spasms. ?Patient not taking: Reported on 10/20/2021 09/07/21   Horald Pollen, MD  ?ondansetron (ZOFRAN) 4 MG tablet Take 1 tablet (4 mg total) by mouth every 6 (six) hours as needed for nausea or vomiting. ?Patient not taking: Reported on 10/20/2021 04/14/21   Marybelle Killings, MD  ?predniSONE (DELTASONE) 10 MG tablet Taper dose follow enclosed instructions ?Patient not taking: Reported on 10/20/2021 09/19/21   Wallene Huh, DPM  ? ? ?Allergies  ?Allergen Reactions  ? Aspirin Hives  ? Hydromorphone Hcl Hives and Nausea And Vomiting  ? Latex Rash  ? ? ?Patient Active Problem List  ? Diagnosis Date Noted  ? Low folic acid 93/73/4287  ? Chronic pain syndrome 09/07/2021  ? Status post lumbar spine surgery for decompression of spinal cord 05/24/2021  ? Dyspepsia 05/23/2021  ? History of gastroesophageal reflux (GERD) 05/23/2021  ? History of lumbar laminectomy for spinal cord decompression 04/26/2021  ? Lumbar stenosis 04/13/2021  ? Tension headache 12/22/2020  ? Low back pain 10/20/2020  ? 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/28/2020  ? Chronic diarrhea 09/14/2017  ? History of  stroke 06/12/2014  ? Cervical radiculitis 02/12/2014  ? Essential hypertension, benign 01/24/2010  ? Microcytic anemia 01/11/2010  ? Hyperlipemia 05/28/2007  ? Current smoker 05/28/2007  ? GERD 05/28/2007  ? ? ?Past Medical History:  ?Diagnosis Date  ? Anemia   ? Arthritis   ? "neck, hips" (04/25/2018)  ? Claustrophobia   ? Daily headache   ? GERD (gastroesophageal reflux disease)   ? Hiatal hernia   ? Hyperlipidemia   ? "hx" (04/25/2018)  ? Hypertension   ? Plantar fasciitis   ? hx - left foot  ? Stroke [facility]) 05/2014  ? "mini stroke" (04/25/2018)  ? ? ?Past Surgical History:  ?Procedure Laterality Date  ? ABDOMINAL HYSTERECTOMY  12/09  ? Secondary to fibroids  ? ARTERY BIOPSY Right 04/10/2018  ? Procedure: BIOPSY TEMPORAL ARTERY;  Surgeon: Rosetta Posner, MD;  Location: [place];  Service: Vascular;  Laterality: Right;  ? [place]; 1988  ? COLONOSCOPY    ? FOOT SURGERY Left 03/2017  ?  Bone Spurs Removed  ? LUMBAR LAMINECTOMY/DECOMPRESSION MICRODISCECTOMY N/A 04/13/2021  ? Procedure: LUMBAR FOUR- LUMBAR FIVE AND LUMBAR FIVE-SACRAL ONE  DECOMPRESSION;  Surgeon: Marybelle Killings, MD;  Location: [place];  Service: Orthopedics;  Laterality: N/A;  ? RADIOLOGY WITH ANESTHESIA N/A 04/26/2018  ? Procedure: MRI WITH ANESTHESIA;  Surgeon: Radiologist, Medication, MD;  Location: [place];  Service: Radiology;  Laterality: N/A;  ? RADIOLOGY WITH ANESTHESIA N/A 08/24/2019  ? Procedure: MRI WITH ANESTHESIA CERVICAL SPINE;  Surgeon: Radiologist, Medication, MD;  Location: [place];  Service: Radiology;  Laterality: N/A;  ? RADIOLOGY WITH ANESTHESIA N/A 10/02/2019  ? Procedure: MRI WITH ANESTHESIA CERVICAL WITHOUT CONTRAST;  Surgeon: Radiologist, Medication, MD;  Location: [place];  Service: Radiology;  Laterality: N/A;  ? TUBAL LIGATION  1988  ? ? ?Social History  ? ?Socioeconomic History  ? Marital status: Divorced  ?  Spouse name: Not on file  ? Number of children: Not on file  ? Years of education: Not on file  ? Highest education  level: Not on file  ?Occupational History  ? Occupation: unemployed  ?  Comment: used to work in a [employer]  ?Tobacco Use  ? Smoking status: Some Days  ?  Packs/day: 0.25  ?  Years: 38.00  ?  Pack year

## 2021-10-20 NOTE — Assessment & Plan Note (Signed)
Currently active.  Recently seen by podiatrist. ?Needs to follow-up with them as scheduled. ?

## 2021-10-20 NOTE — Patient Instructions (Signed)

## 2021-10-20 NOTE — Assessment & Plan Note (Signed)
Well-controlled hypertension.  Continue lisinopril 30 mg daily. ?

## 2021-10-20 NOTE — Assessment & Plan Note (Signed)
Well-controlled.  Asymptomatic.  Continue Protonix 40 mg daily and follow-up with GI doctor as scheduled. ?

## 2021-10-20 NOTE — Assessment & Plan Note (Signed)
Recently evaluated by hematologist.  Work-up in progress. ?Found to have folic acid deficiency.  Started on folic acid supplementation. ?Follow-up with hematologist as scheduled. ?

## 2021-10-20 NOTE — Assessment & Plan Note (Signed)
Cardiovascular and cancer risks associated with smoking discussed.  Smoking cessation advice given.  States she is smoking a lot less. ?

## 2021-10-26 ENCOUNTER — Ambulatory Visit: Payer: 59 | Admitting: Internal Medicine

## 2021-10-31 ENCOUNTER — Other Ambulatory Visit (HOSPITAL_COMMUNITY): Payer: Self-pay

## 2021-10-31 ENCOUNTER — Telehealth: Payer: Self-pay | Admitting: Emergency Medicine

## 2021-10-31 NOTE — Telephone Encounter (Signed)
Called patient to inform her of provider recommendation . Patient verbalize understanding. ?

## 2021-10-31 NOTE — Telephone Encounter (Signed)
Patient wants to know if she should continue taking the potassium and the folic acid?  Please advise  ?

## 2021-10-31 NOTE — Telephone Encounter (Signed)
Potassium no, folic acid yes.  Thanks.

## 2021-11-02 ENCOUNTER — Ambulatory Visit (INDEPENDENT_AMBULATORY_CARE_PROVIDER_SITE_OTHER): Payer: 59 | Admitting: Family Medicine

## 2021-11-02 ENCOUNTER — Other Ambulatory Visit (HOSPITAL_COMMUNITY): Payer: Self-pay

## 2021-11-02 ENCOUNTER — Other Ambulatory Visit: Payer: Self-pay | Admitting: Nurse Practitioner

## 2021-11-02 ENCOUNTER — Encounter: Payer: Self-pay | Admitting: Family Medicine

## 2021-11-02 VITALS — BP 136/84 | HR 85 | Temp 97.7°F | Ht <= 58 in | Wt 139.0 lb

## 2021-11-02 DIAGNOSIS — J069 Acute upper respiratory infection, unspecified: Secondary | ICD-10-CM | POA: Diagnosis not present

## 2021-11-02 DIAGNOSIS — J309 Allergic rhinitis, unspecified: Secondary | ICD-10-CM | POA: Diagnosis not present

## 2021-11-02 DIAGNOSIS — R519 Headache, unspecified: Secondary | ICD-10-CM

## 2021-11-02 LAB — POC COVID19 BINAXNOW: SARS Coronavirus 2 Ag: NEGATIVE

## 2021-11-02 MED ORDER — PANTOPRAZOLE SODIUM 40 MG PO TBEC
40.0000 mg | DELAYED_RELEASE_TABLET | Freq: Two times a day (BID) | ORAL | 1 refills | Status: DC
  Filled 2021-11-02: qty 30, 30d supply, fill #0
  Filled 2021-11-25: qty 30, 30d supply, fill #1
  Filled 2021-12-13 – 2021-12-30 (×2): qty 30, 30d supply, fill #2
  Filled 2022-01-27: qty 30, 30d supply, fill #3

## 2021-11-02 MED ORDER — LEVOCETIRIZINE DIHYDROCHLORIDE 5 MG PO TABS
5.0000 mg | ORAL_TABLET | Freq: Every evening | ORAL | 1 refills | Status: DC
  Filled 2021-11-02: qty 30, 30d supply, fill #0
  Filled 2022-04-17: qty 30, 30d supply, fill #1

## 2021-11-02 MED ORDER — FLUTICASONE PROPIONATE 50 MCG/ACT NA SUSP
2.0000 | Freq: Every day | NASAL | 1 refills | Status: DC
  Filled 2021-11-02: qty 16, 30d supply, fill #0

## 2021-11-02 NOTE — Patient Instructions (Addendum)
Your symptoms appear to be related to a viral illness versus allergies.  ? ?I recommend taking Xyzal and using Flonase nasal spray. You may also do salt water gargles and take Tylenol or ibuprofen for your headache, sore throat or feeling achy.  ? ?Follow up if you are getting worse or not improving over the next week.  ?

## 2021-11-02 NOTE — Progress Notes (Signed)
Subjective: ? [name] is a 61 y.o. female who presents for possible sinus infection.  Symptoms include an 8 hour history of frontal headache, rhinorrhea, itchy and watery eyes, post nasal drainage, coughing and gagging. States her throat is sore.  ?Denies fever, chills, dizziness, chest pain, palpitations, shortness of breath, abdominal pain, N/V/D.  ? ?Patient is a smoker, smoking 1 cigarette per day.  Using Ricola cough lozenges for symptoms.  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  No other aggravating or relieving factors.  No other c/o. ? ?ROS as in subjective ? ? ?Objective: ?Vitals:  ? 11/02/21 1544  ?BP: 136/84  ?Pulse: 85  ?Temp: 97.7 ?F (36.5 ?C)  ?SpO2: 98%  ? ? ?General appearance: Alert, WD/WN, no distress ?                            Skin: warm and dry ?                          ?                         Heart: RRR ?                        Lungs: CTA bilaterally, no wheezes, rales, or rhonchi ?    ?  ?Assessment and Plan: ?Viral URI with cough - Plan: POC COVID-19 ? ?Acute nonintractable headache, unspecified headache type - Plan: POC COVID-19 ? ?Allergic rhinitis, unspecified seasonality, unspecified trigger - Plan: levocetirizine (XYZAL) 5 MG tablet, fluticasone (FLONASE) 50 MCG/ACT nasal spray ? ? ?Prescription for Flonase and Xyzal.  Can use OTC Mucinex for congestion.  Tylenol or Ibuprofen OTC for fever and malaise.  Discussed symptomatic relief. Follow up if worsening or not improving in the next week.  ?

## 2021-11-03 ENCOUNTER — Telehealth: Payer: Self-pay

## 2021-11-03 ENCOUNTER — Other Ambulatory Visit (HOSPITAL_COMMUNITY): Payer: Self-pay

## 2021-11-03 ENCOUNTER — Other Ambulatory Visit: Payer: Self-pay | Admitting: Family Medicine

## 2021-11-03 ENCOUNTER — Encounter: Payer: 59 | Admitting: Physical Medicine & Rehabilitation

## 2021-11-03 DIAGNOSIS — J069 Acute upper respiratory infection, unspecified: Secondary | ICD-10-CM

## 2021-11-03 MED ORDER — BENZONATATE 200 MG PO CAPS
200.0000 mg | ORAL_CAPSULE | Freq: Two times a day (BID) | ORAL | 0 refills | Status: DC | PRN
  Filled 2021-11-03: qty 20, 10d supply, fill #0

## 2021-11-03 NOTE — Telephone Encounter (Signed)
OK with this transfer request.

## 2021-11-03 NOTE — Telephone Encounter (Addendum)
Pt is calling to report that she isn't feeling any better. Pt has taking one dose of the meds prescibed.  ? ?Pt is asking for recommendation for the cough. ? ?Please advise ?

## 2021-11-03 NOTE — Telephone Encounter (Signed)
Thanks

## 2021-11-03 NOTE — Telephone Encounter (Signed)
ATC pt to inform of medications but was given the message "call could not be completed at this time, please call again later" ?

## 2021-11-03 NOTE — Telephone Encounter (Signed)
Pt is requesting to transfer care from Dr. Mitchel Honour to Mack Hook, NP to start seeing a female provider. Pt had an appt with Mack Hook, NP and thought it would be a good fit. ? ?I advised Pt upon approval I would call her back to complete the TOC. ? ?Please advise ? ?

## 2021-11-04 NOTE — Telephone Encounter (Signed)
Pt called in to see who and why our office called.  ? ?Informed pt it was to let her know that TOC was okay with both providers. ? ?PCP changed to [place].  ?

## 2021-11-10 ENCOUNTER — Other Ambulatory Visit (HOSPITAL_COMMUNITY): Payer: Self-pay

## 2021-11-10 ENCOUNTER — Encounter: Payer: Self-pay | Admitting: Family Medicine

## 2021-11-10 ENCOUNTER — Ambulatory Visit (INDEPENDENT_AMBULATORY_CARE_PROVIDER_SITE_OTHER): Payer: 59 | Admitting: Family Medicine

## 2021-11-10 VITALS — BP 136/76 | HR 82 | Temp 97.8°F | Ht <= 58 in | Wt 142.0 lb

## 2021-11-10 DIAGNOSIS — R112 Nausea with vomiting, unspecified: Secondary | ICD-10-CM

## 2021-11-10 DIAGNOSIS — R6883 Chills (without fever): Secondary | ICD-10-CM | POA: Diagnosis not present

## 2021-11-10 DIAGNOSIS — J22 Unspecified acute lower respiratory infection: Secondary | ICD-10-CM

## 2021-11-10 DIAGNOSIS — R059 Cough, unspecified: Secondary | ICD-10-CM | POA: Diagnosis not present

## 2021-11-10 DIAGNOSIS — B9689 Other specified bacterial agents as the cause of diseases classified elsewhere: Secondary | ICD-10-CM

## 2021-11-10 LAB — POC COVID19 BINAXNOW: SARS Coronavirus 2 Ag: NEGATIVE

## 2021-11-10 MED ORDER — AZITHROMYCIN 250 MG PO TABS
ORAL_TABLET | ORAL | 0 refills | Status: AC
  Filled 2021-11-10: qty 6, 5d supply, fill #0

## 2021-11-10 MED ORDER — ONDANSETRON 4 MG PO TBDP
4.0000 mg | ORAL_TABLET | Freq: Three times a day (TID) | ORAL | 0 refills | Status: DC | PRN
  Filled 2021-11-10: qty 20, 7d supply, fill #0

## 2021-11-10 NOTE — Progress Notes (Signed)
Subjective:     Patient ID: [name], female    DOB: December 28, 1960, 61 y.o.   MRN: [id number]  Chief Complaint  Patient presents with   Emesis    Vomiting since yesterday   Cough    Cough is still present even after medication    Chills    Yesterday hot and cold chills   Nasal Congestion    Head and nasal congestion still present along with watery eyes    HPI Patient is in today for worsening respiratory symptoms.  I saw her approximately 8 days ago for acute onset of allergy symptoms and viral illness.  Today she reports worsening cough cough and chest congestion.  States she was wheezing yesterday.  States she has been feeling hot and having chills.  Reports taking allergy medication as recommended.  Smoker but states she has not smoked a cigarette since being sick.  States she had nausea and vomiting yesterday, none today. Abdominal cramping yesterday and diarrhea but this has  resolved.   No clinical fever.  Denies dizziness, chest pain, palpitations, shortness of breath, urinary symptoms or lower extremity edema.  States she has been drinking plenty of water.   Health Maintenance Due  Topic Date Due   MAMMOGRAM  10/22/2021    Past Medical History:  Diagnosis Date   Anemia    Arthritis    "neck, hips" (04/25/2018)   Claustrophobia    Daily headache    GERD (gastroesophageal reflux disease)    Hiatal hernia    Hyperlipidemia    "hx" (04/25/2018)   Hypertension    Plantar fasciitis    hx - left foot   Stroke ([place]) 05/2014   "mini stroke" (04/25/2018)    Past Surgical History:  Procedure Laterality Date   ABDOMINAL HYSTERECTOMY  12/09   Secondary to fibroids   ARTERY BIOPSY Right 04/10/2018   Procedure: BIOPSY TEMPORAL ARTERY;  Surgeon: Rosetta Posner, MD;  Location: [facility];  Service: Vascular;  Laterality: Right;   [place]; 1988   COLONOSCOPY     FOOT SURGERY Left 03/2017   Bone Spurs Removed   LUMBAR LAMINECTOMY/DECOMPRESSION  MICRODISCECTOMY N/A 04/13/2021   Procedure: LUMBAR FOUR- LUMBAR FIVE AND LUMBAR FIVE-SACRAL ONE  DECOMPRESSION;  Surgeon: Marybelle Killings, MD;  Location: [facility];  Service: Orthopedics;  Laterality: N/A;   RADIOLOGY WITH ANESTHESIA N/A 04/26/2018   Procedure: MRI WITH ANESTHESIA;  Surgeon: Radiologist, Medication, MD;  Location: [facility];  Service: Radiology;  Laterality: N/A;   RADIOLOGY WITH ANESTHESIA N/A 08/24/2019   Procedure: MRI WITH ANESTHESIA CERVICAL SPINE;  Surgeon: Radiologist, Medication, MD;  Location: [facility];  Service: Radiology;  Laterality: N/A;   RADIOLOGY WITH ANESTHESIA N/A 10/02/2019   Procedure: MRI WITH ANESTHESIA CERVICAL WITHOUT CONTRAST;  Surgeon: Radiologist, Medication, MD;  Location: [facility];  Service: Radiology;  Laterality: N/A;   TUBAL LIGATION  1988    Family History  Problem Relation Age of Onset   Cancer Mother 43       pancreatic cancer   Pancreatic cancer Mother    Heart disease Mother    Cancer Father 47       throat cancer   Esophageal cancer Father    Cancer Maternal Aunt 77       breast cancer   Cancer Other        Died  from Colon cancer in 60's   Colon cancer Maternal Uncle    Colon cancer Paternal Uncle  Heart disease Sister    Rectal cancer Neg Hx    Stomach cancer Neg Hx     Social History   Socioeconomic History   Marital status: Divorced    Spouse name: Not on file   Number of children: Not on file   Years of education: Not on file   Highest education level: Not on file  Occupational History   Occupation: unemployed    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   Social Determinants of Health   Financial Resource Strain: Not on file  Food Insecurity: Not on file  Transportation Needs: Not on file  Physical Activity: Not on file  Stress: Not on file  Social Connections: Not on file  Intimate Partner Violence: Not on file    Outpatient Medications Prior to Visit  Medication Sig Dispense Refill   amLODipine (NORVASC) 5 MG tablet Take 1 tablet (5 mg total) by mouth daily. 90 tablet 3   benzonatate (TESSALON) 200 MG capsule Take 1 capsule (200 mg total) by mouth 2 (two) times daily as needed for cough. 20 capsule 0   Diclofenac Sodium 3 % GEL Apply 1 application topically 3 (three) times daily as needed. 1001 g 1   fluticasone (FLONASE) 50 MCG/ACT nasal spray Place 2 sprays into both nostrils daily. 16 g 1   folic acid (FOLVITE) 1 MG tablet Take 1 tablet (1 mg total) by mouth daily. 30 tablet 2   levocetirizine (XYZAL) 5 MG tablet Take 1 tablet (5 mg total) by mouth every evening. 30 tablet 1   lipase/protease/amylase (CREON) 36000 UNITS CPEP capsule Take 3 capsules (108,000 Units total) by mouth 3 (three) times daily with meals AND 2 capsules (72,000 Units total) with snacks. 330 capsule 6   lisinopril (ZESTRIL) 30 MG tablet Take 1 tablet (30 mg total) by mouth daily. 90 tablet 3   pantoprazole (PROTONIX) 40 MG tablet Take 1 tablet (40 mg total) by mouth 2 (two) times daily. Take 30 minutes before breakfast. 60 tablet 1   predniSONE (DELTASONE) 10 MG tablet Taper dose follow enclosed instructions 48 tablet 0   rosuvastatin (CRESTOR) 20 MG tablet Take 1 tablet (20 mg total) by mouth daily. 90 tablet 3   No facility-administered medications prior to visit.    Allergies  Allergen Reactions   Aspirin Hives   Hydromorphone Hcl Hives and Nausea And Vomiting   Latex Rash     ROS Pertinent positives and negatives in the history of present illness.     Objective:    Physical Exam  BP 136/76 (BP Location: Left Arm, Patient Position: Sitting, Cuff Size: Large)   Pulse 82   Temp 97.8 F (36.6 C) (Temporal)   Ht '4\' 9"'$  (1.448 m)   Wt 142 lb (64.4 kg)  LMP  (LMP Unknown)   SpO2 99%   BMI 30.73 kg/m  Wt Readings from Last 3 Encounters:  11/10/21 142 lb (64.4 kg)  11/02/21 139 lb (63 kg)  10/20/21 136 lb 4 oz (61.8 kg)   Alert and in no distress.  Cardiac exam shows a regular rate and rhythm. Lungs are clear to auscultation.  Congested cough . Abdomen is soft, nondistended, nontender, normal bowel sounds.  Extremities without edema.  Skin is warm and dry.  Normal speech and mood.      Assessment & Plan:   Problem List Items Addressed This Visit   None Visit Diagnoses     Bacterial lower respiratory infection    -  Primary   Relevant Medications   azithromycin (ZITHROMAX) 250 MG tablet   Chills       Relevant Orders   POC COVID-19   Cough, unspecified type       Relevant Orders   POC COVID-19   Nausea and vomiting, unspecified vomiting type       Relevant Medications   ondansetron (ZOFRAN-ODT) 4 MG disintegrating tablet      Here today with worsening respiratory symptoms.  No sign of dehydration. Azithromycin prescribed.  She will continue with allergy and cold medications.  She also has Tessalon which I prescribed at her previous visit.  She is not in any acute distress.  Nausea, vomiting and diarrhea yesterday but no symptoms today.  Zofran prescribed in case nausea returns. She will follow-up if worsening or not back to baseline in 7 to 10 days.  I am having [name] start on azithromycin and ondansetron. I am also having her maintain her Diclofenac Sodium, amLODipine, rosuvastatin, predniSONE, folic acid, lipase/protease/amylase, lisinopril, pantoprazole, levocetirizine, fluticasone, and benzonatate.  Meds ordered this  encounter  Medications   azithromycin (ZITHROMAX) 250 MG tablet    Sig: Take 2 tablets on day 1, then 1 tablet daily on days 2 through 5    Dispense:  6 tablet    Refill:  0    Order Specific Question:   Supervising Provider    Answer:   Pricilla Holm A [[id number]]   ondansetron (ZOFRAN-ODT) 4 MG disintegrating tablet    Sig: Take 1 tablet (4 mg total) by mouth every 8 (eight) hours as needed for nausea or vomiting.    Dispense:  20 tablet    Refill:  0    Order Specific Question:   Supervising Provider    Answer:   Pricilla Holm A [[id number]]

## 2021-11-10 NOTE — Patient Instructions (Signed)
Take the antibiotic as prescribed.  I am also prescribing a nausea medication called Zofran that you placed under your tongue if you feel sick to your stomach.  Continue your over-the-counter and prescription medications for allergies and cold symptoms.  Follow-up if you are getting worse or not back to baseline in approximately 7 to 10 days.

## 2021-11-11 ENCOUNTER — Encounter: Payer: Self-pay | Admitting: Physical Medicine & Rehabilitation

## 2021-11-25 ENCOUNTER — Other Ambulatory Visit (HOSPITAL_COMMUNITY): Payer: Self-pay

## 2021-11-28 ENCOUNTER — Other Ambulatory Visit: Payer: 59

## 2021-11-28 ENCOUNTER — Ambulatory Visit: Payer: 59 | Admitting: Internal Medicine

## 2021-11-29 ENCOUNTER — Other Ambulatory Visit: Payer: 59

## 2021-11-29 ENCOUNTER — Ambulatory Visit: Payer: 59 | Admitting: Internal Medicine

## 2021-12-02 ENCOUNTER — Ambulatory Visit (INDEPENDENT_AMBULATORY_CARE_PROVIDER_SITE_OTHER): Payer: 59 | Admitting: Family Medicine

## 2021-12-02 ENCOUNTER — Encounter: Payer: Self-pay | Admitting: Family Medicine

## 2021-12-02 VITALS — BP 124/76 | HR 90 | Temp 97.7°F | Ht <= 58 in | Wt 139.0 lb

## 2021-12-02 DIAGNOSIS — R748 Abnormal levels of other serum enzymes: Secondary | ICD-10-CM | POA: Diagnosis not present

## 2021-12-02 DIAGNOSIS — R252 Cramp and spasm: Secondary | ICD-10-CM | POA: Diagnosis not present

## 2021-12-02 DIAGNOSIS — R7989 Other specified abnormal findings of blood chemistry: Secondary | ICD-10-CM

## 2021-12-02 DIAGNOSIS — R202 Paresthesia of skin: Secondary | ICD-10-CM | POA: Diagnosis not present

## 2021-12-02 LAB — COMPREHENSIVE METABOLIC PANEL
ALT: 140 U/L — ABNORMAL HIGH (ref 0–35)
AST: 103 U/L — ABNORMAL HIGH (ref 0–37)
Albumin: 4.2 g/dL (ref 3.5–5.2)
Alkaline Phosphatase: 81 U/L (ref 39–117)
BUN: 19 mg/dL (ref 6–23)
CO2: 26 mEq/L (ref 19–32)
Calcium: 8.9 mg/dL (ref 8.4–10.5)
Chloride: 106 mEq/L (ref 96–112)
Creatinine, Ser: 0.73 mg/dL (ref 0.40–1.20)
GFR: 89.52 mL/min (ref >60.00)
Glucose, Bld: 101 mg/dL — ABNORMAL HIGH (ref 70–99)
Potassium: 3.8 mEq/L (ref 3.5–5.1)
Sodium: 140 mEq/L (ref 135–145)
Total Bilirubin: 0.3 mg/dL (ref 0.2–1.2)
Total Protein: 7 g/dL (ref 6.0–8.3)

## 2021-12-02 LAB — CBC WITH DIFFERENTIAL/PLATELET
Basophils Absolute: 0 10*3/uL (ref 0.0–0.1)
Basophils Relative: 0.4 % (ref 0.0–3.0)
Eosinophils Absolute: 0 10*3/uL (ref 0.0–0.7)
Eosinophils Relative: 0.4 % (ref 0.0–5.0)
HCT: 33.2 % — ABNORMAL LOW (ref 36.0–46.0)
Hemoglobin: 10.4 g/dL — ABNORMAL LOW (ref 12.0–15.0)
Lymphocytes Relative: 38 % (ref 12.0–46.0)
Lymphs Abs: 2.4 10*3/uL (ref 0.7–4.0)
MCHC: 31.3 g/dL (ref 30.0–36.0)
MCV: 72 fl — ABNORMAL LOW (ref 78.0–100.0)
Monocytes Absolute: 0.5 10*3/uL (ref 0.1–1.0)
Monocytes Relative: 8.3 % (ref 3.0–12.0)
Neutro Abs: 3.3 10*3/uL (ref 1.4–7.7)
Neutrophils Relative %: 52.9 % (ref 43.0–77.0)
Platelets: 228 10*3/uL (ref 150.0–400.0)
RBC: 4.6 Mil/uL (ref 3.87–5.11)
RDW: 15.6 % — ABNORMAL HIGH (ref 11.5–15.5)
WBC: 6.3 10*3/uL (ref 4.0–10.5)

## 2021-12-02 LAB — TSH: TSH: 0.33 u[IU]/mL — ABNORMAL LOW (ref 0.35–5.50)

## 2021-12-02 LAB — HEPATIC FUNCTION PANEL
ALT: 140 U/L — ABNORMAL HIGH (ref 0–35)
AST: 103 U/L — ABNORMAL HIGH (ref 0–37)
Albumin: 4.2 g/dL (ref 3.5–5.2)
Alkaline Phosphatase: 81 U/L (ref 39–117)
Bilirubin, Direct: 0.1 mg/dL (ref 0.0–0.3)
Total Bilirubin: 0.3 mg/dL (ref 0.2–1.2)
Total Protein: 7 g/dL (ref 6.0–8.3)

## 2021-12-02 LAB — VITAMIN B12: Vitamin B-12: 259 pg/mL (ref 211–911)

## 2021-12-02 LAB — MAGNESIUM: Magnesium: 1.3 mg/dL — ABNORMAL LOW (ref 1.5–2.5)

## 2021-12-02 LAB — T4, FREE: Free T4: 0.72 ng/dL (ref 0.60–1.60)

## 2021-12-02 NOTE — Patient Instructions (Signed)
We will be in touch with your lab results.    Muscle Cramps and Spasms Muscle cramps and spasms are when muscles tighten by themselves. They usually get better within minutes. Muscle cramps are painful. They are usually stronger and last longer than muscle spasms. Muscle spasms may or may not be painful. They can last a few seconds or much longer. Cramps and spasms can affect any muscle, but they occur most often in the calf muscles of the leg. They are usually not caused by a serious problem. In many cases, the cause is not known. Some common causes include: Doing more physical work or exercise than your body is ready for. Using the muscles too much (overuse) by repeating certain movements too many times. Staying in a certain position for a long time. Playing a sport or doing an activity without preparing properly. Using bad form or technique while playing a sport or doing an activity. Not having enough water in your body (dehydration). Injury. Side effects of some medicines. Low levels of the salts and minerals in your blood (electrolytes), such as low potassium or calcium. Follow these instructions at home: Managing pain and stiffness     Massage, stretch, and relax the muscle. Do this for many minutes at a time. If told, put heat on tight or tense muscles as often as told by your doctor. Use the heat source that your doctor recommends, such as a moist heat pack or a heating pad. Place a towel between your skin and the heat source. Leave the heat on for 20-30 minutes. Remove the heat if your skin turns bright red. This is very important if you are not able to feel pain, heat, or cold. You may have a greater risk of getting burned. If told, put ice on the affected area. This may help if you are sore or have pain after a cramp or spasm. Put ice in a plastic bag. Place a towel between your skin and the bag. Leave the ice on for 20 minutes, 2-3 times a day. Try taking hot showers or baths  to help relax tight muscles. Eating and drinking Drink enough fluid to keep your pee (urine) pale yellow. Eat a healthy diet to help ensure that your muscles work well. This should include: Fruits and vegetables. Lean protein. Whole grains. Low-fat or nonfat dairy products. General instructions If you are having cramps often, avoid intense exercise for several days. Take over-the-counter and prescription medicines only as told by your doctor. Watch for any changes in your symptoms. Keep all follow-up visits as told by your doctor. This is important. Contact a doctor if: Your cramps or spasms get worse or happen more often. Your cramps or spasms do not get better with time. Summary Muscle cramps and spasms are when muscles tighten by themselves. They usually get better within minutes. Cramps and spasms occur most often in the calf muscles of the leg. Massage, stretch, and relax the muscle. This may help the cramp or spasm go away. Drink enough fluid to keep your pee (urine) pale yellow. This information is not intended to replace advice given to you by your health care provider. Make sure you discuss any questions you have with your health care provider. Document Revised: 12/31/2020 Document Reviewed: 12/31/2020 Elsevier Patient Education  [place].

## 2021-12-02 NOTE — Progress Notes (Unsigned)
Subjective:     Patient ID: [name], female    DOB: 11/08/1960, 61 y.o.   MRN: [id number]  Chief Complaint  Patient presents with   Dysmenorrhea    Pt has been having cramps in her hands, feet and legs. Leg and feet cramps started Friday/Saturday but hand cramps have been going on for a while now.     HPI Patient is in today for muscle cramps in her right foot, right lower leg and in her fingers. She occasionally has tingling in her hands and feet. No numbness or focal weakness.   She has been eating bananas.   Dr. Henrene Pastor is her GI.  Hx of elevated LFTs.   Health Maintenance Due  Topic Date Due   MAMMOGRAM  10/22/2021    Past Medical History:  Diagnosis Date   Anemia    Arthritis    "neck, hips" (04/25/2018)   Claustrophobia    Daily headache    GERD (gastroesophageal reflux disease)    Hiatal hernia    Hyperlipidemia    "hx" (04/25/2018)   Hypertension    Plantar fasciitis    hx - left foot   Stroke ([place]) 05/2014   "mini stroke" (04/25/2018)    Past Surgical History:  Procedure Laterality Date   ABDOMINAL HYSTERECTOMY  12/09   Secondary to fibroids   ARTERY BIOPSY Right 04/10/2018   Procedure: BIOPSY TEMPORAL ARTERY;  Surgeon: Rosetta Posner, MD;  Location: [facility];  Service: Vascular;  Laterality: Right;   [place]; 1988   COLONOSCOPY     FOOT SURGERY Left 03/2017   Bone Spurs Removed   LUMBAR LAMINECTOMY/DECOMPRESSION MICRODISCECTOMY N/A 04/13/2021   Procedure: LUMBAR FOUR- LUMBAR FIVE AND LUMBAR FIVE-SACRAL ONE  DECOMPRESSION;  Surgeon: Marybelle Killings, MD;  Location: [facility];  Service: Orthopedics;  Laterality: N/A;   RADIOLOGY WITH ANESTHESIA N/A 04/26/2018   Procedure: MRI WITH ANESTHESIA;  Surgeon: Radiologist, Medication, MD;  Location: [facility];  Service: Radiology;  Laterality: N/A;   RADIOLOGY WITH ANESTHESIA N/A 08/24/2019   Procedure: MRI WITH ANESTHESIA CERVICAL SPINE;  Surgeon: Radiologist, Medication, MD;  Location: [facility];   Service: Radiology;  Laterality: N/A;   RADIOLOGY WITH ANESTHESIA N/A 10/02/2019   Procedure: MRI WITH ANESTHESIA CERVICAL WITHOUT CONTRAST;  Surgeon: Radiologist, Medication, MD;  Location: [facility];  Service: Radiology;  Laterality: N/A;   TUBAL LIGATION  1988    Family History  Problem Relation Age of Onset   Cancer Mother 29       pancreatic cancer   Pancreatic cancer Mother    Heart disease Mother    Cancer Father 68       throat cancer   Esophageal cancer Father    Cancer Maternal Aunt 77       breast cancer   Cancer Other        Died  from Colon cancer in 60's   Colon cancer Maternal Uncle    Colon cancer Paternal Uncle    Heart disease Sister    Rectal cancer Neg Hx    Stomach cancer Neg Hx     Social History   Socioeconomic History   Marital status: Divorced    Spouse name: Not on file   Number of children: Not on file   Years of education: Not on file   Highest education level: Not on file  Occupational History   Occupation: unemployed    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   Social Determinants of Health   Financial Resource Strain: Not on file  Food Insecurity: Not on file  Transportation Needs: No Transportation Needs (08/27/2019)   PRAPARE - Hydrologist (Medical): No    Lack of Transportation (Non-Medical): No  Physical Activity: Not on file  Stress: Not  on file  Social Connections: Not on file  Intimate Partner Violence: Not on file    Outpatient Medications Prior to Visit  Medication Sig Dispense Refill   amLODipine (NORVASC) 5 MG tablet Take 1 tablet (5 mg total) by mouth daily. 90 tablet 3   benzonatate (TESSALON) 200 MG capsule Take 1 capsule (200 mg total) by mouth 2 (two) times daily as needed for cough. 20 capsule 0   Diclofenac Sodium 3 % GEL Apply 1 application topically 3 (three) times daily as needed. 1001 g 1   fluticasone (FLONASE) 50 MCG/ACT nasal spray Place 2 sprays into both nostrils daily. 16 g 1   folic acid (FOLVITE) 1 MG tablet Take 1 tablet (1 mg total) by mouth daily. 30 tablet 2   levocetirizine (XYZAL) 5 MG tablet Take 1 tablet (5 mg total) by mouth every evening. 30 tablet 1   lipase/protease/amylase (CREON) 36000 UNITS CPEP capsule Take 3 capsules (108,000 Units total) by mouth 3 (three) times daily with meals AND 2 capsules (72,000 Units total) with snacks. 330 capsule 6   lisinopril (ZESTRIL) 30 MG tablet Take 1 tablet (30 mg total) by mouth daily. 90 tablet 3   ondansetron (ZOFRAN-ODT) 4 MG disintegrating tablet Take 1 tablet (4 mg total) by mouth every 8 (eight) hours as needed for nausea or vomiting. 20 tablet 0   pantoprazole (PROTONIX) 40 MG tablet Take 1 tablet (40 mg total) by mouth 2 (two) times daily. Take 30 minutes before breakfast. 60 tablet 1   predniSONE (DELTASONE) 10 MG tablet Taper dose follow enclosed instructions 48 tablet 0   rosuvastatin (CRESTOR) 20 MG tablet Take 1 tablet (20 mg total) by mouth daily. 90 tablet 3   No facility-administered medications prior to visit.    Allergies  Allergen Reactions   Aspirin Hives   Hydromorphone Hcl Hives and Nausea And Vomiting   Latex Rash    ROS     Objective:    Physical Exam Constitutional:      General: She is not in acute distress.    Appearance: She is not ill-appearing.  Cardiovascular:     Rate and Rhythm: Normal rate.   Pulmonary:     Effort: Pulmonary effort is normal.  Musculoskeletal:     Cervical back: Normal range of motion and neck supple.  Neurological:     General: No focal deficit present.     Mental Status: She is alert  and oriented to person, place, and time.  Psychiatric:        Mood and Affect: Mood normal.        Thought Content: Thought content normal.     BP 124/76 (BP Location: Left Arm, Patient Position: Sitting, Cuff Size: Large)   Pulse 90   Temp 97.7 F (36.5 C) (Temporal)   Ht '4\' 9"'$  (1.448 m)   Wt 139 lb (63 kg)   LMP  (LMP Unknown)   SpO2 98%   BMI 30.08 kg/m  Wt Readings from Last 3 Encounters:  12/02/21 139 lb (63 kg)  11/10/21 142 lb (64.4 kg)  11/02/21 139 lb (63 kg)        Assessment & Plan:   Problem List Items Addressed This Visit       Other   Elevated LFTs    Recheck hepatic function and refer back to GI as needed.       Elevated liver enzymes   Relevant Orders   Comprehensive metabolic panel (Completed)   Muscle cramps - Primary    Discussed possible etiologies and will check for electrolyte abnormalities. Follow up pending labs.       Relevant Orders   CBC with Differential/Platelet (Completed)   Comprehensive metabolic panel (Completed)   Magnesium (Completed)   TSH (Completed)   T4, free (Completed)   Paresthesias    Intermittent. Check labs      Relevant Orders   CBC with Differential/Platelet (Completed)   Comprehensive metabolic panel (Completed)   Vitamin B12 (Completed)   TSH (Completed)   T4, free (Completed)    I am having [name] maintain her Diclofenac Sodium, amLODipine, rosuvastatin, predniSONE, folic acid, lipase/protease/amylase, lisinopril, pantoprazole, levocetirizine, fluticasone, benzonatate, and ondansetron.  No orders of the defined types were placed in this encounter.

## 2021-12-04 DIAGNOSIS — R252 Cramp and spasm: Secondary | ICD-10-CM | POA: Insufficient documentation

## 2021-12-04 DIAGNOSIS — R7989 Other specified abnormal findings of blood chemistry: Secondary | ICD-10-CM | POA: Insufficient documentation

## 2021-12-04 DIAGNOSIS — R748 Abnormal levels of other serum enzymes: Secondary | ICD-10-CM | POA: Insufficient documentation

## 2021-12-04 DIAGNOSIS — R202 Paresthesia of skin: Secondary | ICD-10-CM | POA: Insufficient documentation

## 2021-12-04 NOTE — Assessment & Plan Note (Signed)
Discussed possible etiologies and will check for electrolyte abnormalities. Follow up pending labs.

## 2021-12-04 NOTE — Assessment & Plan Note (Signed)
Intermittent. Check labs

## 2021-12-04 NOTE — Assessment & Plan Note (Signed)
Recheck hepatic function and refer back to GI as needed.

## 2021-12-05 ENCOUNTER — Ambulatory Visit: Payer: 59 | Admitting: Internal Medicine

## 2021-12-05 ENCOUNTER — Other Ambulatory Visit: Payer: 59

## 2021-12-06 ENCOUNTER — Encounter: Payer: 59 | Admitting: Physical Medicine & Rehabilitation

## 2021-12-13 ENCOUNTER — Other Ambulatory Visit (HOSPITAL_COMMUNITY): Payer: 59

## 2021-12-13 ENCOUNTER — Encounter (HOSPITAL_COMMUNITY): Payer: Self-pay

## 2021-12-13 ENCOUNTER — Other Ambulatory Visit (HOSPITAL_COMMUNITY): Payer: Self-pay

## 2021-12-13 ENCOUNTER — Ambulatory Visit (HOSPITAL_COMMUNITY)
Admission: EM | Admit: 2021-12-13 | Discharge: 2021-12-13 | Disposition: A | Discharge status: Home / Self Care | Payer: 59 | Attending: Family Medicine | Admitting: Family Medicine

## 2021-12-13 ENCOUNTER — Ambulatory Visit (INDEPENDENT_AMBULATORY_CARE_PROVIDER_SITE_OTHER): Payer: 59

## 2021-12-13 DIAGNOSIS — W19XXXA Unspecified fall, initial encounter: Secondary | ICD-10-CM | POA: Diagnosis not present

## 2021-12-13 DIAGNOSIS — R030 Elevated blood-pressure reading, without diagnosis of hypertension: Secondary | ICD-10-CM

## 2021-12-13 DIAGNOSIS — S8001XA Contusion of right knee, initial encounter: Secondary | ICD-10-CM

## 2021-12-13 DIAGNOSIS — Z8679 Personal history of other diseases of the circulatory system: Secondary | ICD-10-CM

## 2021-12-13 DIAGNOSIS — I10 Essential (primary) hypertension: Secondary | ICD-10-CM | POA: Diagnosis not present

## 2021-12-13 DIAGNOSIS — Z87891 Personal history of nicotine dependence: Secondary | ICD-10-CM

## 2021-12-13 NOTE — Discharge Instructions (Addendum)
Your xray was negative for acute findings. Continue wearing your knee brace. Follow up with PCP/ Ortho if pain persists. Take otc Tylenol as label directed for pain.  Return to urgent care as needed.  Continue home meds as prescribed

## 2021-12-13 NOTE — ED Provider Notes (Signed)
[place]    CSN: [id number] Arrival date & time: 12/13/21  0801      History   Chief Complaint Chief Complaint  Patient presents with   Fall   Knee Pain    HPI [name] is a 61 y.o. female.   60 year old female, [name], presents to urgent care with chief complaint of right knee pain.  Patient states that she fell on Sunday 6/18/25023. Pt has neoprene sleeve she is wearing for comfort, has been taking aleve for pain management.   PMH: HTN  The history is provided by the patient. No language interpreter was used.    Past Medical History:  Diagnosis Date   Anemia    Arthritis    "neck, hips" (04/25/2018)   Claustrophobia    Daily headache    GERD (gastroesophageal reflux disease)    Hiatal hernia    Hyperlipidemia    "hx" (04/25/2018)   Hypertension    Plantar fasciitis    hx - left foot   Stroke ([place]) 05/2014   "mini stroke" (04/25/2018)    Patient Active Problem List   Diagnosis Date Noted   History of hypertension 12/13/2021   History of smoking 12/13/2021   Elevated blood pressure reading 12/13/2021   Contusion of right knee 12/13/2021   Fall 12/13/2021   Muscle cramps 12/04/2021   Elevated liver enzymes 12/04/2021   Paresthesias 12/04/2021   Elevated LFTs 12/04/2021   Chronic anemia 10/20/2021   Chronic foot pain 70/06/7492   Folic acid deficiency 49/67/5916   Chronic pain syndrome 09/07/2021   Status post lumbar spine surgery for decompression of spinal cord 05/24/2021   Dyspepsia 05/23/2021   History of gastroesophageal reflux (GERD) 05/23/2021   History of lumbar laminectomy for spinal cord decompression 04/26/2021   Lumbar stenosis 04/13/2021   Tension headache 12/22/2020   Low back pain 10/20/2020   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/28/2020   Chronic diarrhea 09/14/2017   History of stroke 06/12/2014   Cervical radiculitis 02/12/2014   Essential hypertension, benign 01/24/2010   Microcytic anemia  01/11/2010   Hyperlipemia 05/28/2007   Current smoker 05/28/2007   GERD 05/28/2007    Past Surgical History:  Procedure Laterality Date   ABDOMINAL HYSTERECTOMY  12/09   Secondary to fibroids   ARTERY BIOPSY Right 04/10/2018   Procedure: BIOPSY TEMPORAL ARTERY;  Surgeon: Rosetta Posner, MD;  Location: [place];  Service: Vascular;  Laterality: Right;   [place]; 1988   COLONOSCOPY     FOOT SURGERY Left 03/2017   Bone Spurs Removed   LUMBAR LAMINECTOMY/DECOMPRESSION MICRODISCECTOMY N/A 04/13/2021   Procedure: LUMBAR FOUR- LUMBAR FIVE AND LUMBAR FIVE-SACRAL ONE  DECOMPRESSION;  Surgeon: Marybelle Killings, MD;  Location: [place];  Service: Orthopedics;  Laterality: N/A;   RADIOLOGY WITH ANESTHESIA N/A 04/26/2018   Procedure: MRI WITH ANESTHESIA;  Surgeon: Radiologist, Medication, MD;  Location: [place];  Service: Radiology;  Laterality: N/A;   RADIOLOGY WITH ANESTHESIA N/A 08/24/2019   Procedure: MRI WITH ANESTHESIA CERVICAL SPINE;  Surgeon: Radiologist, Medication, MD;  Location: [place];  Service: Radiology;  Laterality: N/A;   RADIOLOGY WITH ANESTHESIA N/A 10/02/2019   Procedure: MRI WITH ANESTHESIA CERVICAL WITHOUT CONTRAST;  Surgeon: Radiologist, Medication, MD;  Location: [place];  Service: Radiology;  Laterality: N/A;   TUBAL LIGATION  1988    OB History   No obstetric history on file.      Home Medications    Prior to Admission medications  Medication Sig Start Date End Date Taking? Authorizing Provider  amLODipine (NORVASC) 5 MG tablet Take 1 tablet (5 mg total) by mouth daily. 06/10/21   Horald Pollen, MD  benzonatate (TESSALON) 200 MG capsule Take 1 capsule (200 mg total) by mouth 2 (two) times daily as needed for cough. 11/03/21   Henson, Vickie L, NP-C  Diclofenac Sodium 3 % GEL Apply 1 application topically 3 (three) times daily as needed. 06/09/21   Horald Pollen, MD  fluticasone [facility]) 50 MCG/ACT nasal spray Place 2 sprays into both nostrils daily.  11/02/21   Henson, Vickie L, NP-C  folic acid (FOLVITE) 1 MG tablet Take 1 tablet (1 mg total) by mouth daily. 09/19/21   Heilingoetter, Cassandra L, PA-C  levocetirizine (XYZAL) 5 MG tablet Take 1 tablet (5 mg total) by mouth every evening. 11/02/21   Henson, Vickie L, NP-C  lipase/protease/amylase (CREON) 36000 UNITS CPEP capsule Take 3 capsules (108,000 Units total) by mouth 3 (three) times daily with meals AND 2 capsules (72,000 Units total) with snacks. 09/26/21   Irene Shipper, MD  lisinopril (ZESTRIL) 30 MG tablet Take 1 tablet (30 mg total) by mouth daily. 10/20/21   Horald Pollen, MD  ondansetron (ZOFRAN-ODT) 4 MG disintegrating tablet Take 1 tablet (4 mg total) by mouth every 8 (eight) hours as needed for nausea or vomiting. 11/10/21   Henson, Vickie L, NP-C  pantoprazole (PROTONIX) 40 MG tablet Take 1 tablet (40 mg total) by mouth 2 (two) times daily. Take 30 minutes before breakfast. 11/02/21   Noralyn Pick, NP  predniSONE (DELTASONE) 10 MG tablet Taper dose follow enclosed instructions 09/19/21   Wallene Huh, DPM  rosuvastatin (CRESTOR) 20 MG tablet Take 1 tablet (20 mg total) by mouth daily. 07/27/21   Horald Pollen, MD  potassium chloride SA (KLOR-CON M) 20 MEQ tablet Take 1 tablet (20 mEq total) by mouth daily. 09/19/21 10/31/21  Heilingoetter, Cassandra L, PA-C    Family History Family History  Problem Relation Age of Onset   Cancer Mother 52       pancreatic cancer   Pancreatic cancer Mother    Heart disease Mother    Cancer Father 22       throat cancer   Esophageal cancer Father    Cancer Maternal Aunt 22       breast cancer   Cancer Other        Died  from Colon cancer in 60's   Colon cancer Maternal Uncle    Colon cancer Paternal Uncle    Heart disease Sister    Rectal cancer Neg Hx    Stomach cancer Neg Hx     Social History Social History   Tobacco Use   Smoking status: Some Days    Packs/day: 0.25    Years: 38.00    Total pack years:  9.50    Types: Cigarettes   Smokeless tobacco: Never   Tobacco comments:    0-4 cigs/day   Vaping Use   Vaping Use: Never used  Substance Use Topics   Alcohol use: Yes    Alcohol/week: 4.0 standard drinks of alcohol    Types: 4 Cans of beer per week   Drug use: Not Currently     Allergies   Aspirin, Hydromorphone hcl, and Latex   Review of Systems Review of Systems  Constitutional:  Negative for fever.  Musculoskeletal:  Positive for arthralgias. Negative for gait problem.  Skin: Negative.   All other  systems reviewed and are negative.    Physical Exam Triage Vital Signs ED Triage Vitals [12/13/21 0819]  Enc Vitals Group     BP (!) 168/108     Pulse Rate 94     Resp 18     Temp 98.3 F (36.8 C)     Temp Source Oral     SpO2 96 %     Weight      Height      Head Circumference      Peak Flow      Pain Score 10     Pain Loc      Pain Edu?      Excl. in [place]?    No data found.  Updated Vital Signs BP (!) 168/108 (BP Location: Left Arm)   Pulse 94   Temp 98.3 F (36.8 C) (Oral)   Resp 18   LMP  (LMP Unknown)   SpO2 96%   Visual Acuity Right Eye Distance:   Left Eye Distance:   Bilateral Distance:    Right Eye Near:   Left Eye Near:    Bilateral Near:     Physical Exam Vitals and nursing note reviewed.  Constitutional:      General: She is not in acute distress.    Appearance: She is well-developed.  HENT:     Head: Normocephalic and atraumatic.  Eyes:     Conjunctiva/sclera: Conjunctivae normal.  Cardiovascular:     Rate and Rhythm: Normal rate and regular rhythm.     Pulses:          Dorsalis pedis pulses are 2+ on the right side and 2+ on the left side.     Heart sounds: No murmur heard. Pulmonary:     Effort: Pulmonary effort is normal. No respiratory distress.     Breath sounds: Normal breath sounds and air entry.  Abdominal:     Palpations: Abdomen is soft.     Tenderness: There is no abdominal tenderness.  Musculoskeletal:         General: No swelling.     Cervical back: Neck supple.     Right knee: Bony tenderness present. Normal pulse.       Legs:  Skin:    General: Skin is warm and dry.     Capillary Refill: Capillary refill takes less than 2 seconds.  Neurological:     General: No focal deficit present.     Mental Status: She is alert and oriented to person, place, and time.     GCS: GCS eye subscore is 4. GCS verbal subscore is 5. GCS motor subscore is 6.     Cranial Nerves: Cranial nerves 2-12 are intact.     Sensory: Sensation is intact.     Motor: Motor function is intact.     Coordination: Coordination is intact.     Gait: Gait is intact.  Psychiatric:        Attention and Perception: Attention normal.        Mood and Affect: Mood normal.        Speech: Speech normal.        Behavior: Behavior normal. Behavior is cooperative.      UC Treatments / Results  Labs (all labs ordered are listed, but only abnormal results are displayed) Labs Reviewed - No data to display  EKG   Radiology DG Knee Complete 4 Views Right  Result Date: 12/13/2021 CLINICAL DATA:  Fall. EXAM: RIGHT KNEE - COMPLETE 4+ VIEW  COMPARISON:  Distal lower extremity XRs, 10/10/2017. FINDINGS: No evidence of fracture, dislocation, or joint effusion. No evidence of arthropathy or other focal bone abnormality. Proximal RIGHT lower extremity linear radiodensity is favored external. Soft tissues are unremarkable. IMPRESSION: No acute displaced fracture or dislocation within the RIGHT knee. Electronically Signed   By: Michaelle Birks M.D.   On: 12/13/2021 08:42    Procedures Procedures (including critical care time)  Medications Ordered in UC Medications - No data to display  Initial Impression / Assessment and Plan / UC Course  I have reviewed the triage vital signs and the nursing notes.  Pertinent labs & imaging results that were available during my care of the patient were reviewed by me and considered in my medical decision  making (see chart for details).     Ddx: Right knee contusion, sprain, dislocation, fracture. Final Clinical Impressions(s) / UC Diagnoses   Final diagnoses:  History of hypertension  History of smoking  Elevated blood pressure reading  Contusion of right knee, initial encounter  Fall, initial encounter     Discharge Instructions      Your xray was negative for acute findings. Continue wearing your knee brace. Follow up with PCP/ Ortho if pain persists. Take otc Tylenol as label directed for pain.  Return to urgent care as needed.  Continue home meds as prescribed     ED Prescriptions   None    PDMP not reviewed this encounter.   Tori Milks, NP 12/45/80 [phone]

## 2021-12-13 NOTE — ED Triage Notes (Signed)
Pt states slipped on water in a store on Sunday and fell landing on rt knee. States wearing a sleeve with support. Took advil last night.

## 2021-12-16 ENCOUNTER — Other Ambulatory Visit (HOSPITAL_COMMUNITY): Payer: Self-pay

## 2021-12-23 ENCOUNTER — Other Ambulatory Visit (HOSPITAL_COMMUNITY): Payer: Self-pay

## 2021-12-28 ENCOUNTER — Other Ambulatory Visit (HOSPITAL_COMMUNITY): Payer: Self-pay

## 2021-12-30 ENCOUNTER — Other Ambulatory Visit (HOSPITAL_COMMUNITY): Payer: Self-pay

## 2022-01-02 ENCOUNTER — Other Ambulatory Visit (HOSPITAL_COMMUNITY): Payer: Self-pay

## 2022-01-02 MED ORDER — CLINDAMYCIN HCL 150 MG PO CAPS
150.0000 mg | ORAL_CAPSULE | Freq: Four times a day (QID) | ORAL | 1 refills | Status: DC
  Filled 2022-01-02: qty 28, 7d supply, fill #0

## 2022-01-02 MED ORDER — HYDROCODONE-ACETAMINOPHEN 5-325 MG PO TABS
1.0000 | ORAL_TABLET | ORAL | 0 refills | Status: DC | PRN
  Filled 2022-01-02: qty 20, 4d supply, fill #0

## 2022-01-03 ENCOUNTER — Other Ambulatory Visit: Payer: Self-pay

## 2022-01-03 ENCOUNTER — Ambulatory Visit (HOSPITAL_COMMUNITY)
Admission: EM | Admit: 2022-01-03 | Discharge: 2022-01-03 | Disposition: A | Discharge status: Home / Self Care | Payer: 59 | Attending: Internal Medicine | Admitting: Internal Medicine

## 2022-01-03 ENCOUNTER — Other Ambulatory Visit (HOSPITAL_COMMUNITY): Payer: Self-pay

## 2022-01-03 ENCOUNTER — Encounter (HOSPITAL_COMMUNITY): Payer: Self-pay | Admitting: *Deleted

## 2022-01-03 DIAGNOSIS — K2100 Gastro-esophageal reflux disease with esophagitis, without bleeding: Secondary | ICD-10-CM

## 2022-01-03 DIAGNOSIS — T50905A Adverse effect of unspecified drugs, medicaments and biological substances, initial encounter: Secondary | ICD-10-CM | POA: Diagnosis not present

## 2022-01-03 MED ORDER — LIDOCAINE VISCOUS HCL 2 % MT SOLN
OROMUCOSAL | Status: AC
  Filled 2022-01-03: qty 15

## 2022-01-03 MED ORDER — ONDANSETRON 4 MG PO TBDP
4.0000 mg | ORAL_TABLET | Freq: Once | ORAL | Status: AC
  Administered 2022-01-03: 4 mg via ORAL

## 2022-01-03 MED ORDER — ALUM & MAG HYDROXIDE-SIMETH 200-200-20 MG/5ML PO SUSP
ORAL | Status: AC
  Filled 2022-01-03: qty 30

## 2022-01-03 MED ORDER — ONDANSETRON 4 MG PO TBDP
4.0000 mg | ORAL_TABLET | Freq: Three times a day (TID) | ORAL | 0 refills | Status: DC | PRN
  Filled 2022-01-03: qty 21, 7d supply, fill #0

## 2022-01-03 MED ORDER — AMOXICILLIN-POT CLAVULANATE 875-125 MG PO TABS
1.0000 | ORAL_TABLET | Freq: Two times a day (BID) | ORAL | 0 refills | Status: DC
  Filled 2022-01-03: qty 14, 7d supply, fill #0

## 2022-01-03 MED ORDER — ALUM & MAG HYDROXIDE-SIMETH 200-200-20 MG/5ML PO SUSP
30.0000 mL | Freq: Once | ORAL | Status: AC
  Administered 2022-01-03: 30 mL via ORAL

## 2022-01-03 MED ORDER — LIDOCAINE VISCOUS HCL 2 % MT SOLN
15.0000 mL | Freq: Once | OROMUCOSAL | Status: AC
  Administered 2022-01-03: 15 mL via ORAL

## 2022-01-03 MED ORDER — ONDANSETRON 4 MG PO TBDP
ORAL_TABLET | ORAL | Status: AC
  Filled 2022-01-03: qty 1

## 2022-01-03 NOTE — Discharge Instructions (Addendum)
-  Stop the clindamycin and start the augmentin tomorrow -Start the antibiotic-Augmentin (amoxicillin-clavulanate), 1 pill every 12 hours for 7 days.  You can take this with food like with breakfast and dinner. -Continue the protonix twice daily  -Eat bland foods like toast, applesauce, bananas -Follow-up with dentist if continued issues with antibiotic

## 2022-01-03 NOTE — ED Provider Notes (Signed)
[place]    CSN: [id number] Arrival date & time: 01/03/22  0827      History   Chief Complaint Chief Complaint  Patient presents with   Gastroesophageal Reflux    HPI [name] is a 61 y.o. female presenting with indigestion following taking clindamycin on an empty stomach. History hypertension, dental infection, GERD. currently taking protonix bid for GERD. States she saw her dentist and was diagnosed with dental abscess one day ago. She started her first dose of clindamycin today, taking one pill on an empty stomach. She quickly felt nauseated with epigastric burning, and had one episode of bilious vomiting. Also 2-3 episodes watery diarrhea today. States symptoms are improving overall. Denies chest pain, left-sided chest pain, left arm pain, left jaw pain, dizziness, weakness, headaches. Denies hematemesis, melena, hematochezia.   HPI  Past Medical History:  Diagnosis Date   Anemia    Arthritis    "neck, hips" (04/25/2018)   Claustrophobia    Daily headache    GERD (gastroesophageal reflux disease)    Hiatal hernia    Hyperlipidemia    "hx" (04/25/2018)   Hypertension    Plantar fasciitis    hx - left foot   Stroke ([place]) 05/2014   "mini stroke" (04/25/2018)    Patient Active Problem List   Diagnosis Date Noted   History of hypertension 12/13/2021   History of smoking 12/13/2021   Elevated blood pressure reading 12/13/2021   Contusion of right knee 12/13/2021   Fall 12/13/2021   Muscle cramps 12/04/2021   Elevated liver enzymes 12/04/2021   Paresthesias 12/04/2021   Elevated LFTs 12/04/2021   Chronic anemia 10/20/2021   Chronic foot pain 83/15/1761   Folic acid deficiency 60/73/7106   Chronic pain syndrome 09/07/2021   Status post lumbar spine surgery for decompression of spinal cord 05/24/2021   Dyspepsia 05/23/2021   History of gastroesophageal reflux (GERD) 05/23/2021   History of lumbar laminectomy for spinal cord decompression  04/26/2021   Lumbar stenosis 04/13/2021   Tension headache 12/22/2020   Low back pain 10/20/2020   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/28/2020   Chronic diarrhea 09/14/2017   History of stroke 06/12/2014   Cervical radiculitis 02/12/2014   Essential hypertension, benign 01/24/2010   Microcytic anemia 01/11/2010   Hyperlipemia 05/28/2007   Current smoker 05/28/2007   GERD 05/28/2007    Past Surgical History:  Procedure Laterality Date   ABDOMINAL HYSTERECTOMY  12/09   Secondary to fibroids   ARTERY BIOPSY Right 04/10/2018   Procedure: BIOPSY TEMPORAL ARTERY;  Surgeon: Rosetta Posner, MD;  Location: [place];  Service: Vascular;  Laterality: Right;   [place]; 1988   COLONOSCOPY     FOOT SURGERY Left 03/2017   Bone Spurs Removed   LUMBAR LAMINECTOMY/DECOMPRESSION MICRODISCECTOMY N/A 04/13/2021   Procedure: LUMBAR FOUR- LUMBAR FIVE AND LUMBAR FIVE-SACRAL ONE  DECOMPRESSION;  Surgeon: Marybelle Killings, MD;  Location: [place];  Service: Orthopedics;  Laterality: N/A;   RADIOLOGY WITH ANESTHESIA N/A 04/26/2018   Procedure: MRI WITH ANESTHESIA;  Surgeon: Radiologist, Medication, MD;  Location: [place];  Service: Radiology;  Laterality: N/A;   RADIOLOGY WITH ANESTHESIA N/A 08/24/2019   Procedure: MRI WITH ANESTHESIA CERVICAL SPINE;  Surgeon: Radiologist, Medication, MD;  Location: [place];  Service: Radiology;  Laterality: N/A;   RADIOLOGY WITH ANESTHESIA N/A 10/02/2019   Procedure: MRI WITH ANESTHESIA CERVICAL WITHOUT CONTRAST;  Surgeon: Radiologist, Medication, MD;  Location: [place];  Service: Radiology;  Laterality:  N/A;   TUBAL LIGATION  1988    OB History   No obstetric history on file.      Home Medications    Prior to Admission medications   Medication Sig Start Date End Date Taking? Authorizing Provider  amoxicillin-clavulanate (AUGMENTIN) 875-125 MG tablet Take 1 tablet by mouth every 12 (twelve) hours. 01/03/22  Yes Hazel Sams, PA-C  ondansetron (ZOFRAN-ODT) 4 MG  disintegrating tablet Take 1 tablet (4 mg total) by mouth every 8 (eight) hours as needed for nausea or vomiting. 01/03/22  Yes Hazel Sams, PA-C  amLODipine (NORVASC) 5 MG tablet Take 1 tablet (5 mg total) by mouth daily. 06/10/21   Horald Pollen, MD  benzonatate (TESSALON) 200 MG capsule Take 1 capsule (200 mg total) by mouth 2 (two) times daily as needed for cough. 11/03/21   Henson, Vickie L, NP-C  Diclofenac Sodium 3 % GEL Apply 1 application topically 3 (three) times daily as needed. 06/09/21   Horald Pollen, MD  fluticasone [facility]) 50 MCG/ACT nasal spray Place 2 sprays into both nostrils daily. 11/02/21   Henson, Vickie L, NP-C  folic acid (FOLVITE) 1 MG tablet Take 1 tablet (1 mg total) by mouth daily. 09/19/21   Heilingoetter, Cassandra L, PA-C  HYDROcodone-acetaminophen (NORCO/VICODIN) 5-325 MG tablet Take 1 tablet by mouth every 4-6 hours as needed for pain 01/02/22     levocetirizine (XYZAL) 5 MG tablet Take 1 tablet (5 mg total) by mouth every evening. 11/02/21   Henson, Vickie L, NP-C  lipase/protease/amylase (CREON) 36000 UNITS CPEP capsule Take 3 capsules (108,000 Units total) by mouth 3 (three) times daily with meals AND 2 capsules (72,000 Units total) with snacks. 09/26/21   Irene Shipper, MD  lisinopril (ZESTRIL) 30 MG tablet Take 1 tablet (30 mg total) by mouth daily. 10/20/21   Horald Pollen, MD  pantoprazole (PROTONIX) 40 MG tablet Take 1 tablet (40 mg total) by mouth 2 (two) times daily. Take 30 minutes before breakfast. 11/02/21   Noralyn Pick, NP  predniSONE (DELTASONE) 10 MG tablet Taper dose follow enclosed instructions 09/19/21   Wallene Huh, DPM  rosuvastatin (CRESTOR) 20 MG tablet Take 1 tablet (20 mg total) by mouth daily. 07/27/21   Horald Pollen, MD  potassium chloride SA (KLOR-CON M) 20 MEQ tablet Take 1 tablet (20 mEq total) by mouth daily. 09/19/21 10/31/21  Heilingoetter, Cassandra L, PA-C    Family History Family History   Problem Relation Age of Onset   Cancer Mother 56       pancreatic cancer   Pancreatic cancer Mother    Heart disease Mother    Cancer Father 50       throat cancer   Esophageal cancer Father    Cancer Maternal Aunt 21       breast cancer   Cancer Other        Died  from Colon cancer in 60's   Colon cancer Maternal Uncle    Colon cancer Paternal Uncle    Heart disease Sister    Rectal cancer Neg Hx    Stomach cancer Neg Hx     Social History Social History   Tobacco Use   Smoking status: Some Days    Packs/day: 0.25    Years: 38.00    Total pack years: 9.50    Types: Cigarettes   Smokeless tobacco: Never   Tobacco comments:    0-4 cigs/day   Vaping Use   Vaping Use:  Never used  Substance Use Topics   Alcohol use: Yes    Alcohol/week: 4.0 standard drinks of alcohol    Types: 4 Cans of beer per week   Drug use: Not Currently     Allergies   Aspirin, Hydromorphone hcl, and Latex   Review of Systems Review of Systems  Gastrointestinal:  Positive for abdominal pain.  All other systems reviewed and are negative.    Physical Exam Triage Vital Signs ED Triage Vitals  Enc Vitals Group     BP 01/03/22 0851 137/83     Pulse Rate 01/03/22 0851 90     Resp 01/03/22 0851 18     Temp 01/03/22 0851 98.9 F (37.2 C)     Temp src --      SpO2 01/03/22 0851 99 %     Weight --      Height --      Head Circumference --      Peak Flow --      Pain Score 01/03/22 0848 10     Pain Loc --      Pain Edu? --      Excl. in [place]? --    No data found.  Updated Vital Signs BP 137/83   Pulse 90   Temp 98.9 F (37.2 C)   Resp 18   LMP  (LMP Unknown)   SpO2 99%   Visual Acuity Right Eye Distance:   Left Eye Distance:   Bilateral Distance:    Right Eye Near:   Left Eye Near:    Bilateral Near:     Physical Exam Vitals reviewed.  Constitutional:      General: She is not in acute distress.    Appearance: Normal appearance. She is not ill-appearing.  HENT:      Head: Normocephalic and atraumatic.     Mouth/Throat:     Mouth: Mucous membranes are moist.     Comments: Moist mucous membranes Eyes:     Extraocular Movements: Extraocular movements intact.     Pupils: Pupils are equal, round, and reactive to light.  Cardiovascular:     Rate and Rhythm: Normal rate and regular rhythm.     Heart sounds: Normal heart sounds.  Pulmonary:     Effort: Pulmonary effort is normal.     Breath sounds: Normal breath sounds. No wheezing, rhonchi or rales.  Abdominal:     General: Bowel sounds are normal. There is no distension.     Palpations: Abdomen is soft. There is no mass.     Tenderness: There is abdominal tenderness in the epigastric area. There is no right CVA tenderness, left CVA tenderness, guarding or rebound. Negative signs include Murphy's sign, Rovsing's sign and McBurney's sign.     Comments: Minimal epigastric pain to palpation, without guarding or rebound. Comfortable throughout exam.  Skin:    General: Skin is warm.     Capillary Refill: Capillary refill takes less than 2 seconds.     Comments: Good skin turgor  Neurological:     General: No focal deficit present.     Mental Status: She is alert and oriented to person, place, and time.  Psychiatric:        Mood and Affect: Mood normal.        Behavior: Behavior normal.      UC Treatments / Results  Labs (all labs ordered are listed, but only abnormal results are displayed) Labs Reviewed - No data to display  EKG  Radiology No results found.  Procedures Procedures (including critical care time)  Medications Ordered in UC Medications  ondansetron (ZOFRAN-ODT) disintegrating tablet 4 mg (4 mg Oral Given 01/03/22 1000)  alum & mag hydroxide-simeth (MAALOX/MYLANTA) 200-200-20 MG/5ML suspension 30 mL (30 mLs Oral Given 01/03/22 1000)    And  lidocaine (XYLOCAINE) 2 % viscous mouth solution 15 mL (15 mLs Oral Given 01/03/22 1001)    Initial Impression / Assessment and Plan /  UC Course  I have reviewed the triage vital signs and the nursing notes.  Pertinent labs & imaging results that were available during my care of the patient were reviewed by me and considered in my medical decision making (see chart for details).     This patient is a very pleasant 61 y.o. year old female presenting with episode of indigestion following taking one pill of clindamycin on an empty stomach this morning (2 hours ago). This was the first pill of clindamycin she has taken, so low concern for c dif. She is afebrile and nontachy, and nontoxic appearing. History of GERD, currently taking protonix bid for this. GI cocktail and zofran ODT administered during visit with improvement in symptoms. Denies CP, SOB. EKG unchanged from 10/22 EKG. Will discharge home with zofran ODT. Stop clindamycin and start augmentin, taken with food. F/u with dentist for further abx changes. She is in agreement.   Final Clinical Impressions(s) / UC Diagnoses   Final diagnoses:  Adverse effect of drug, initial encounter  Gastroesophageal reflux disease with esophagitis without hemorrhage     Discharge Instructions      -Stop the clindamycin and start the augmentin tomorrow -Start the antibiotic-Augmentin (amoxicillin-clavulanate), 1 pill every 12 hours for 7 days.  You can take this with food like with breakfast and dinner. -Continue the protonix twice daily  -Eat bland foods like toast, applesauce, bananas -Follow-up with dentist if continued issues with antibiotic       ED Prescriptions     Medication Sig Dispense Auth. Provider   ondansetron (ZOFRAN-ODT) 4 MG disintegrating tablet Take 1 tablet (4 mg total) by mouth every 8 (eight) hours as needed for nausea or vomiting. 21 tablet Hazel Sams, PA-C   amoxicillin-clavulanate (AUGMENTIN) 875-125 MG tablet Take 1 tablet by mouth every 12 (twelve) hours. 14 tablet Hazel Sams, PA-C      PDMP not reviewed this encounter.   Hazel Sams, PA-C 01/03/22 1031

## 2022-01-03 NOTE — ED Triage Notes (Addendum)
Pt reports she took her med for acid reflux this morning but it has not worked. Pt reports epi gastric pain since this morning. Pt started taking Clindamycin and took a dose on a empty stomach today.

## 2022-01-11 ENCOUNTER — Encounter: Payer: Self-pay | Admitting: Internal Medicine

## 2022-01-11 ENCOUNTER — Ambulatory Visit (INDEPENDENT_AMBULATORY_CARE_PROVIDER_SITE_OTHER): Payer: 59 | Admitting: Internal Medicine

## 2022-01-11 ENCOUNTER — Other Ambulatory Visit (HOSPITAL_COMMUNITY): Payer: Self-pay

## 2022-01-11 VITALS — BP 110/78 | HR 80 | Ht <= 58 in | Wt 136.0 lb

## 2022-01-11 DIAGNOSIS — K602 Anal fissure, unspecified: Secondary | ICD-10-CM

## 2022-01-11 DIAGNOSIS — K8689 Other specified diseases of pancreas: Secondary | ICD-10-CM

## 2022-01-11 DIAGNOSIS — D509 Iron deficiency anemia, unspecified: Secondary | ICD-10-CM | POA: Diagnosis not present

## 2022-01-11 DIAGNOSIS — R7989 Other specified abnormal findings of blood chemistry: Secondary | ICD-10-CM | POA: Diagnosis not present

## 2022-01-11 DIAGNOSIS — K219 Gastro-esophageal reflux disease without esophagitis: Secondary | ICD-10-CM

## 2022-01-11 DIAGNOSIS — D123 Benign neoplasm of transverse colon: Secondary | ICD-10-CM

## 2022-01-11 MED ORDER — DILTIAZEM GEL 2 %: 1.0000 | Freq: Every day | CUTANEOUS | 1 refills | Status: DC

## 2022-01-11 MED ORDER — DILTIAZEM GEL 2 %
1.0000 | Freq: Every day | CUTANEOUS | 1 refills | Status: DC
  Filled 2022-01-11: qty 30, 6d supply, fill #0

## 2022-01-11 NOTE — Progress Notes (Signed)
HISTORY OF PRESENT ILLNESS:  [name] is a 61 y.o. female with past medical history as listed below.  She presents today with a chief complaint of rectal pain with minor bleeding with defecation.  She also presents for follow-up regarding elevated hepatic transaminases.  Patient was last seen in this office by the GI nurse practitioner July 07, 2021 regarding GERD, elevated liver test, microcytic anemia, and pancreatic insufficiency.  See dictation for details.  She was placed on pantoprazole.  Repeat liver tests are elevated.  Right upper quadrant ultrasound was unremarkable.  Multiple other tests and serologies assessing for chronic elevation of liver tests were negative or normal except for positive hepatitis a antibody consistent with immunity and a very mild elevation of ANA (1-80).  Normal globulins.  She was set up for upper endoscopy with myself August 18, 2021.  Examination was normal except for small hiatal hernia.  She was told to continue PPI therapy.  She did undergo complete colonoscopy May 2021.  She was found to have small hemorrhoids and a prominent hypertrophic anal papilla.  Patient states that about a week ago she developed pain with defecation.  She noticed a protrusion.  Since that time she has had burning pain which she describes as severe, with each bowel movement.  Minor bleeding.  She denies constipation.  She continues on Creon for pancreatic insufficiency.  She is pleased to report that PPI is completely cure her reflux symptoms.  She is pleased.  Just requiring 1 dose per day.  We reviewed her liver test abnormality work-up today.  REVIEW OF SYSTEMS:  All non-GI ROS negative unless otherwise stated in the HPI except for anxiety, arthritis, back pain  Past Medical History:  Diagnosis Date   Anemia    Arthritis    "neck, hips" (04/25/2018)   Claustrophobia    Daily headache    GERD (gastroesophageal reflux disease)    Hiatal hernia    Hyperlipidemia     "hx" (04/25/2018)   Hypertension    Plantar fasciitis    hx - left foot   Stroke ([place]) 05/2014   "mini stroke" (04/25/2018)    Past Surgical History:  Procedure Laterality Date   ABDOMINAL HYSTERECTOMY  12/09   Secondary to fibroids   ARTERY BIOPSY Right 04/10/2018   Procedure: BIOPSY TEMPORAL ARTERY;  Surgeon: Rosetta Posner, MD;  Location: [facility];  Service: Vascular;  Laterality: Right;   [place]; 1988   COLONOSCOPY     FOOT SURGERY Left 03/2017   Bone Spurs Removed   LUMBAR LAMINECTOMY/DECOMPRESSION MICRODISCECTOMY N/A 04/13/2021   Procedure: LUMBAR FOUR- LUMBAR FIVE AND LUMBAR FIVE-SACRAL ONE  DECOMPRESSION;  Surgeon: Marybelle Killings, MD;  Location: [facility];  Service: Orthopedics;  Laterality: N/A;   RADIOLOGY WITH ANESTHESIA N/A 04/26/2018   Procedure: MRI WITH ANESTHESIA;  Surgeon: Radiologist, Medication, MD;  Location: [facility];  Service: Radiology;  Laterality: N/A;   RADIOLOGY WITH ANESTHESIA N/A 08/24/2019   Procedure: MRI WITH ANESTHESIA CERVICAL SPINE;  Surgeon: Radiologist, Medication, MD;  Location: [facility];  Service: Radiology;  Laterality: N/A;   RADIOLOGY WITH ANESTHESIA N/A 10/02/2019   Procedure: MRI WITH ANESTHESIA CERVICAL WITHOUT CONTRAST;  Surgeon: Radiologist, Medication, MD;  Location: [place];  Service: Radiology;  Laterality: N/A;   TUBAL LIGATION  1988    Social History [name]  reports that she has been smoking cigarettes. She has a 9.50 pack-year smoking history. She has never used smokeless tobacco. She reports  current alcohol use of about 4.0 standard drinks of alcohol per week. She reports that she does not currently use drugs.  family history includes Cancer in an other family member; Cancer (age of onset: 33) in her maternal aunt; Cancer (age of onset: 64) in her mother; Cancer (age of onset: 27) in her father; Colon cancer in her maternal uncle and paternal uncle; Esophageal cancer in her father; Heart disease in her mother and sister;  Pancreatic cancer in her mother.  Allergies  Allergen Reactions   Aspirin Hives   Hydromorphone Hcl Hives and Nausea And Vomiting   Latex Rash       PHYSICAL EXAMINATION: Vital signs: BP 110/78 (BP Location: Left Arm, Patient Position: Sitting, Cuff Size: Normal)   Pulse 80   Ht '4\' 10"'$  (1.473 m) Comment: height measured without shoes  Wt 136 lb (61.7 kg)   LMP  (LMP Unknown)   BMI 28.42 kg/m   Constitutional: generally well-appearing, no acute distress Psychiatric: alert and oriented x3, cooperative Eyes: extraocular movements intact, anicteric, conjunctiva pink Mouth: oral pharynx moist, no lesions Neck: supple no lymphadenopathy Cardiovascular: heart regular rate and rhythm, no murmur Lungs: clear to auscultation bilaterally Abdomen: soft, nontender, nondistended, no obvious ascites, no peritoneal signs, normal bowel sounds, no organomegaly Rectal: No external abnormalities.  Marked tenderness (complains of severe burning) with minimal insertion of the examining finger. Extremities: no clubbing, cyanosis, or lower extremity edema bilaterally Skin: no lesions on visible extremities Neuro: No focal deficits. No asterixis.     ASSESSMENT:  1.  Rectal pain and bleeding likely due to anal fissure. 2.  History of prominent anal papilla on colonoscopy 2021 3.  GERD.  Symptoms controlled with PPI 4.  Pancreatic insufficiency managed with Creon 5.  Chronic microcytic anemia 6.  Elevated hepatic transaminases.  Generally 2-4 times the upper limit of normal.  Negative ultrasound.  Essentially negative laboratory work-up as described 7.  Sessile serrated polyp on colonoscopy.  PLAN:  1.  Citrucel 1 to 2 tablespoons daily. 2.  Sitz baths daily 3.  2% diltiazem ointment to be applied 5 times daily.  I instructed her on proper application procedure. 4.  Office follow-up in 2 months 5.  Repeat hepatic transaminases in 2 months 6.  Continue Creon 7.  Continue pantoprazole 8.   Routine surveillance colonoscopy around May 2026 A total time of 40 minutes was spent preparing to see the patient, reviewing data, obtaining comprehensive history, performing medically appropriate physical exam, counseling and educating patient regarding multiple above listed issues, prescribing medication, directing treatment of seizure, arranging follow-up, and documenting clinical information in the health record

## 2022-01-11 NOTE — Patient Instructions (Signed)
If you are age 60 or older, your body mass index should be between 23-30. Your Body mass index is 28.42 kg/m. If this is out of the aforementioned range listed, please consider follow up with your Primary Care Provider.  If you are age 38 or younger, your body mass index should be between 19-25. Your Body mass index is 28.42 kg/m. If this is out of the aformentioned range listed, please consider follow up with your Primary Care Provider.   ________________________________________________________  The [facility] providers would like to encourage you to use [facility] to communicate with providers for non-urgent requests or questions.  Due to long hold times on the telephone, sending your provider a message by [facility] may be a faster and more efficient way to get a response.  Please allow 48 business hours for a response.  Please remember that this is for non-urgent requests.  _______________________________________________________  1 tablespoon of Citrucel daily  Sitz baths  We have sent a prescription for Diltazem gel to [facility]. You should apply a pea size amount to your rectum three times daily x 6-8 weeks.  [facility]'s information is below: Address: [address] [place], [place] [address]  [phone]  *Please DO NOT go directly from our office to pick up this medication! Give the pharmacy 1 day to process the prescription as this is compounded and takes time to make.

## 2022-01-12 ENCOUNTER — Telehealth: Payer: Self-pay | Admitting: Internal Medicine

## 2022-01-12 ENCOUNTER — Other Ambulatory Visit (HOSPITAL_COMMUNITY): Payer: Self-pay

## 2022-01-12 NOTE — Telephone Encounter (Signed)
Received a call from [facility].  They are unable to make the diltiazem 2% gel.  She said it would need to be sent to a compounding pharmacy.  Please call patient and advise where this will be sent.  Thank you.

## 2022-01-12 NOTE — Telephone Encounter (Signed)
Patient knows to pick up Diltiazem from [facility]

## 2022-01-24 ENCOUNTER — Telehealth: Payer: Self-pay | Admitting: *Deleted

## 2022-01-24 NOTE — Telephone Encounter (Signed)
Patient is calling to ask if physician can prescribed something for her left foot pain.  Her foot started to hurt one month ago and was not financially able to come in, has an upcoming appointment on Friday.Please advise.

## 2022-01-25 NOTE — Telephone Encounter (Signed)
Recommendations given to patient, verbalized understanding.

## 2022-01-25 NOTE — Telephone Encounter (Signed)
She can take ibuprophen or tylenol until seen on  Friday

## 2022-01-27 ENCOUNTER — Ambulatory Visit: Payer: 59 | Admitting: Podiatry

## 2022-01-27 ENCOUNTER — Other Ambulatory Visit (HOSPITAL_COMMUNITY): Payer: Self-pay

## 2022-01-27 ENCOUNTER — Encounter: Payer: Self-pay | Admitting: Podiatry

## 2022-01-27 DIAGNOSIS — M76822 Posterior tibial tendinitis, left leg: Secondary | ICD-10-CM | POA: Diagnosis not present

## 2022-01-27 MED ORDER — TRIAMCINOLONE ACETONIDE 10 MG/ML IJ SUSP
10.0000 mg | Freq: Once | INTRAMUSCULAR | Status: AC
  Administered 2022-01-27: 10 mg

## 2022-01-27 NOTE — Progress Notes (Signed)
Subjective:   Patient ID: [name], female   DOB: 61 y.o.   MRN: [id number]   HPI Patient presents stating she is getting pain on the inside of her left ankle that is been getting sore over the last month and she does have a weightbearing job walking.  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  I did discuss injection explaining risk and she wants injection I did a careful sheath injection 3 mg Dexasone Kenalog 5 mg Xylocaine and applied fascial brace to lift up the arch.  Gave instructions for supportive shoes reappoint to recheck may require orthotic devices

## 2022-01-30 ENCOUNTER — Telehealth: Payer: Self-pay | Admitting: Podiatry

## 2022-01-30 NOTE — Telephone Encounter (Signed)
Pt called and had a shot on Friday and is wearing the strap but is having sharp pain shooting thru her heel. What should she do? Does she need to come in earlier than the 2 weeks?

## 2022-01-30 NOTE — Telephone Encounter (Signed)
Notified pt of what Dr Paulla Dolly said and we scheduled her to come in on Wednesday and will cancel if she does not need it.

## 2022-01-30 NOTE — Telephone Encounter (Signed)
She can come in earlier if it doesn't settle down today, tomorrow

## 2022-02-01 ENCOUNTER — Encounter: Payer: Self-pay | Admitting: Podiatry

## 2022-02-01 ENCOUNTER — Ambulatory Visit: Payer: 59 | Admitting: Podiatry

## 2022-02-01 DIAGNOSIS — M722 Plantar fascial fibromatosis: Secondary | ICD-10-CM | POA: Diagnosis not present

## 2022-02-02 NOTE — Progress Notes (Signed)
Subjective:   Patient ID: [name], female   DOB: 61 y.o.   MRN: [id number]   HPI Patient states her ankle is doing well but her heel has started to hurt again   ROS      Objective:  Physical Exam  Neurovascular status intact exquisite discomfort plantar aspect left heel at the insertional point tendon calcaneus posterior tib doing well     Assessment:  Acute Planter fasciitis left with inflammation     Plan:  Sterile prep injected the plantar fascia left 3 mg Kenalog 5 mg Xylocaine reappoint as needed

## 2022-02-05 ENCOUNTER — Other Ambulatory Visit: Payer: Self-pay | Admitting: Physician Assistant

## 2022-02-05 DIAGNOSIS — E538 Deficiency of other specified B group vitamins: Secondary | ICD-10-CM

## 2022-02-06 ENCOUNTER — Other Ambulatory Visit (HOSPITAL_COMMUNITY): Payer: Self-pay

## 2022-02-06 MED ORDER — FOLIC ACID 1 MG PO TABS
1.0000 mg | ORAL_TABLET | Freq: Every day | ORAL | 2 refills | Status: DC
  Filled 2022-02-06: qty 30, 30d supply, fill #0
  Filled 2022-03-01: qty 30, 30d supply, fill #1
  Filled 2022-04-10: qty 30, 30d supply, fill #2

## 2022-02-10 ENCOUNTER — Ambulatory Visit: Payer: 59 | Admitting: Podiatry

## 2022-03-01 ENCOUNTER — Other Ambulatory Visit (HOSPITAL_COMMUNITY): Payer: Self-pay

## 2022-03-01 ENCOUNTER — Other Ambulatory Visit: Payer: Self-pay | Admitting: Nurse Practitioner

## 2022-03-01 MED ORDER — PANTOPRAZOLE SODIUM 40 MG PO TBEC
40.0000 mg | DELAYED_RELEASE_TABLET | Freq: Two times a day (BID) | ORAL | 1 refills | Status: DC
  Filled 2022-03-01: qty 60, 30d supply, fill #0
  Filled 2022-04-10: qty 30, 15d supply, fill #1
  Filled 2022-06-05: qty 30, 15d supply, fill #2

## 2022-03-03 ENCOUNTER — Other Ambulatory Visit (HOSPITAL_COMMUNITY): Payer: Self-pay

## 2022-03-03 ENCOUNTER — Telehealth: Payer: Self-pay | Admitting: Pharmacy Technician

## 2022-03-03 MED ORDER — AMOXICILLIN 875 MG PO TABS
875.0000 mg | ORAL_TABLET | Freq: Two times a day (BID) | ORAL | 0 refills | Status: DC
  Filled 2022-03-03: qty 20, 10d supply, fill #0

## 2022-03-03 MED ORDER — IBUPROFEN 800 MG PO TABS
800.0000 mg | ORAL_TABLET | Freq: Four times a day (QID) | ORAL | 0 refills | Status: DC
  Filled 2022-03-03: qty 30, 8d supply, fill #0

## 2022-03-03 NOTE — Telephone Encounter (Signed)
Patient Advocate Encounter  Received notification that prior authorization for CREON is required.   PA submitted on 9.8.23 Key [id number] Status is pending    Luciano Cutter, CPhT Patient Advocate Phone: [phone]

## 2022-03-03 NOTE — Telephone Encounter (Signed)
Patient Advocate Encounter  Prior Authorization for Creon [id number] UNIT dr capsules has been approved.    Effective: 03-03-2022 to 03-04-2023  Test claims return a $20.00 co-pay

## 2022-03-15 ENCOUNTER — Other Ambulatory Visit (HOSPITAL_COMMUNITY): Payer: Self-pay

## 2022-03-15 ENCOUNTER — Ambulatory Visit (INDEPENDENT_AMBULATORY_CARE_PROVIDER_SITE_OTHER): Payer: 59 | Admitting: Family Medicine

## 2022-03-15 ENCOUNTER — Encounter: Payer: Self-pay | Admitting: Family Medicine

## 2022-03-15 VITALS — BP 126/84 | HR 93 | Temp 97.6°F | Ht <= 58 in | Wt 136.0 lb

## 2022-03-15 DIAGNOSIS — Z23 Encounter for immunization: Secondary | ICD-10-CM

## 2022-03-15 DIAGNOSIS — E538 Deficiency of other specified B group vitamins: Secondary | ICD-10-CM

## 2022-03-15 DIAGNOSIS — M25521 Pain in right elbow: Secondary | ICD-10-CM

## 2022-03-15 DIAGNOSIS — D509 Iron deficiency anemia, unspecified: Secondary | ICD-10-CM | POA: Diagnosis not present

## 2022-03-15 DIAGNOSIS — Z1231 Encounter for screening mammogram for malignant neoplasm of breast: Secondary | ICD-10-CM | POA: Diagnosis not present

## 2022-03-15 DIAGNOSIS — I10 Essential (primary) hypertension: Secondary | ICD-10-CM | POA: Diagnosis not present

## 2022-03-15 DIAGNOSIS — R7989 Other specified abnormal findings of blood chemistry: Secondary | ICD-10-CM | POA: Diagnosis not present

## 2022-03-15 LAB — T3, FREE: T3, Free: 2.9 pg/mL (ref 2.3–4.2)

## 2022-03-15 LAB — TSH: TSH: 0.97 u[IU]/mL (ref 0.35–5.50)

## 2022-03-15 LAB — MAGNESIUM: Magnesium: 1.5 mg/dL (ref 1.5–2.5)

## 2022-03-15 LAB — T4, FREE: Free T4: 0.7 ng/dL (ref 0.60–1.60)

## 2022-03-15 LAB — VITAMIN B12: Vitamin B-12: 624 pg/mL (ref 211–911)

## 2022-03-15 MED ORDER — DICLOFENAC SODIUM 1 % EX GEL
2.0000 g | Freq: Four times a day (QID) | CUTANEOUS | 1 refills | Status: DC
  Filled 2022-03-15: qty 200, 25d supply, fill #0

## 2022-03-15 NOTE — Progress Notes (Signed)
Subjective:     Patient ID: [name], female    DOB: 09/04/1960, 61 y.o.   MRN: [id number]  Chief Complaint  Patient presents with   Follow-up    3 month f/u     HPI Patient is in today for follow up on low magnesium and B12. Taking supplements.  TSH has been low.   Hx of microcytic anemia. States GI does not think it is related to GI issues. She has never seen hematologist.   Right arm elbow pain x 2-3 days. Works as a Chiropodist.   Denies fever, chills, dizziness, chest pain, palpitations, shortness of breath, abdominal pain, N/V/D, urinary symptoms, LE edema.    Health Maintenance Due  Topic Date Due   MAMMOGRAM  10/22/2021    Past Medical History:  Diagnosis Date   Anemia    Arthritis    "neck, hips" (04/25/2018)   Claustrophobia    Daily headache    GERD (gastroesophageal reflux disease)    Hiatal hernia    Hyperlipidemia    "hx" (04/25/2018)   Hypertension    Plantar fasciitis    hx - left foot   Stroke ([place]) 05/2014   "mini stroke" (04/25/2018)    Past Surgical History:  Procedure Laterality Date   ABDOMINAL HYSTERECTOMY  12/09   Secondary to fibroids   ARTERY BIOPSY Right 04/10/2018   Procedure: BIOPSY TEMPORAL ARTERY;  Surgeon: Rosetta Posner, MD;  Location: [facility];  Service: Vascular;  Laterality: Right;   [place]; 1988   COLONOSCOPY     FOOT SURGERY Left 03/2017   Bone Spurs Removed   LUMBAR LAMINECTOMY/DECOMPRESSION MICRODISCECTOMY N/A 04/13/2021   Procedure: LUMBAR FOUR- LUMBAR FIVE AND LUMBAR FIVE-SACRAL ONE  DECOMPRESSION;  Surgeon: Marybelle Killings, MD;  Location: [facility];  Service: Orthopedics;  Laterality: N/A;   RADIOLOGY WITH ANESTHESIA N/A 04/26/2018   Procedure: MRI WITH ANESTHESIA;  Surgeon: Radiologist, Medication, MD;  Location: [place];  Service: Radiology;  Laterality: N/A;   RADIOLOGY WITH ANESTHESIA N/A 08/24/2019   Procedure: MRI WITH ANESTHESIA CERVICAL SPINE;  Surgeon: Radiologist, Medication, MD;   Location: [facility];  Service: Radiology;  Laterality: N/A;   RADIOLOGY WITH ANESTHESIA N/A 10/02/2019   Procedure: MRI WITH ANESTHESIA CERVICAL WITHOUT CONTRAST;  Surgeon: Radiologist, Medication, MD;  Location: [facility];  Service: Radiology;  Laterality: N/A;   TUBAL LIGATION  1988    Family History  Problem Relation Age of Onset   Cancer Mother 68       pancreatic cancer   Pancreatic cancer Mother    Heart disease Mother    Cancer Father 98       throat cancer   Esophageal cancer Father    Cancer Maternal Aunt 51       breast cancer   Cancer Other        Died  from Colon cancer in 60's   Colon cancer Maternal Uncle    Colon cancer Paternal Uncle    Heart disease Sister    Rectal cancer Neg Hx    Stomach cancer Neg Hx     Social History   Socioeconomic History   Marital status: Divorced    Spouse name: Not on file   Number of children: Not on file   Years of education: Not on file   Highest education level: Not on file  Occupational History   Occupation: unemployed    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   Social Determinants of Health   Financial Resource Strain: Not on file  Food Insecurity: Not on file  Transportation Needs: No Transportation Needs (08/27/2019)   PRAPARE - Hydrologist (Medical): No    Lack of Transportation (Non-Medical): No  Physical Activity: Not on  file  Stress: Not on file  Social Connections: Not on file  Intimate Partner Violence: Not on file    Outpatient Medications Prior to Visit  Medication Sig Dispense Refill   amLODipine (NORVASC) 5 MG tablet Take 1 tablet (5 mg total) by mouth daily. 90 tablet 3   amoxicillin (AMOXIL) 875 MG tablet Take 1 tablet (875 mg total) by mouth every 12 (twelve) hours (2 times per day) until gone 20 tablet 0   Diclofenac Sodium 3 % GEL Apply 1 application topically 3 (three) times daily as needed. 1001 g 1   diltiazem 2 % GEL Apply 1 Application topically 5 (five) times daily. 30 g 1   fluticasone (FLONASE) 50 MCG/ACT nasal spray Place 2 sprays into both nostrils daily. 16 g 1   folic acid (FOLVITE) 1 MG tablet Take 1 tablet (1 mg total) by mouth daily. 30 tablet 2   ibuprofen (ADVIL) 800 MG tablet Take 1 tablet (800 mg total) by mouth every 6-8 hours. Do not exceed 3200 mg/day (4 tablets/day) 30 tablet 0   levocetirizine (XYZAL) 5 MG tablet Take 1 tablet (5 mg total) by mouth every evening. 30 tablet 1   lipase/protease/amylase (CREON) 36000 UNITS CPEP capsule Take 3 capsules (108,000 Units total) by mouth 3 (three) times daily with meals AND 2 capsules (72,000 Units total) with snacks. 330 capsule 6   lisinopril (ZESTRIL) 30 MG tablet Take 1 tablet (30 mg total) by mouth daily. 90 tablet 3   ondansetron (ZOFRAN-ODT) 4 MG disintegrating tablet Take 1 tablet (4 mg total) by mouth every 8 (eight) hours as needed for nausea or vomiting. 21 tablet 0   pantoprazole (PROTONIX) 40 MG tablet Take 1 tablet (40 mg total) by mouth 2 (two) times daily. Take 30 minutes before breakfast. 60 tablet 1   predniSONE (DELTASONE) 10 MG tablet Taper dose follow enclosed instructions 48 tablet 0   rosuvastatin (CRESTOR) 20 MG tablet Take 1 tablet (20 mg total) by mouth daily. 90 tablet 3   HYDROcodone-acetaminophen (NORCO/VICODIN) 5-325 MG tablet Take 1 tablet by mouth every 4-6 hours as needed for pain (Patient not taking:  Reported on 03/15/2022) 20 tablet 0   amoxicillin-clavulanate (AUGMENTIN) 875-125 MG tablet Take 1 tablet by mouth every 12 (twelve) hours. 14 tablet 0   benzonatate (TESSALON) 200 MG capsule Take 1 capsule (200 mg total) by mouth 2 (two) times daily as needed for cough. (Patient not taking: Reported on 01/11/2022) 20 capsule 0  No facility-administered medications prior to visit.    Allergies  Allergen Reactions   Aspirin Hives   Hydromorphone Hcl Hives and Nausea And Vomiting   Latex Rash    ROS     Objective:    Physical Exam  BP 126/84 (BP Location: Left Arm, Patient Position: Sitting, Cuff Size: Large)   Pulse 93   Temp 97.6 F (36.4 C) (Temporal)   Ht '4\' 10"'$  (1.473 m)   Wt 136 lb (61.7 kg)   LMP  (LMP Unknown)   SpO2 98%   BMI 28.42 kg/m  Wt Readings from Last 3 Encounters:  03/15/22 136 lb (61.7 kg)  01/11/22 136 lb (61.7 kg)  12/02/21 139 lb (63 kg)       Assessment & Plan:   Problem List Items Addressed This Visit       Cardiovascular and Mediastinum   Essential hypertension, benign - Primary (Chronic)    Controlled. Continue amlodipine 5 mg and lisinopril 30 mg daily.         Other   Encounter for screening mammogram for malignant neoplasm of breast    She will call to schedule       Relevant Orders   MM 3D SCREEN BREAST BILATERAL   Hypomagnesemia    Continue supplement. Check Mg      Relevant Orders   Magnesium (Completed)   Low TSH level    Check thyroid panel.       Relevant Orders   TSH (Completed)   T4, free (Completed)   T3, free (Completed)   Microcytic anemia    She did not recall seeing hematology for anemia but after further review of her chart, she did see them in March 2023. She was advised to take folic acid and follow up in May but she does not appear to have done this. Recommend she call and schedule.       Right elbow pain    Try topical Voltaren gel. Ice as needed. Follow up if not improving.       Relevant  Medications   diclofenac Sodium (VOLTAREN) 1 % GEL   Vitamin B 12 deficiency    Recheck today after taking supplement      Relevant Orders   Vitamin B12 (Completed)   Other Visit Diagnoses     Need for influenza vaccination       Relevant Orders   Flu Vaccine QUAD 50moIM (Fluarix, Fluzone & Alfiuria Quad PF) (Completed)       I have discontinued Maddyx P. Govoni's benzonatate and amoxicillin-clavulanate. I am also having her start on diclofenac Sodium. Additionally, I am having her maintain her Diclofenac Sodium, amLODipine, rosuvastatin, predniSONE, lipase/protease/amylase, lisinopril, levocetirizine, fluticasone, HYDROcodone-acetaminophen, ondansetron, diltiazem, folic acid, pantoprazole, amoxicillin, and ibuprofen.  Meds ordered this encounter  Medications   diclofenac Sodium (VOLTAREN) 1 % GEL    Sig: Apply 2 g topically 4 (four) times daily.    Dispense:  200 g    Refill:  1    Order Specific Question:   Supervising Provider    Answer:   CPricilla HolmA [[[id number]]

## 2022-03-15 NOTE — Patient Instructions (Signed)
Please go downstairs for labs before you leave today.  Call and schedule your mammogram at the breast center.   I referred you to hematology due to chronic anemia, not iron deficient. They will call you to schedule  We will be in touch with your lab results.

## 2022-03-16 ENCOUNTER — Telehealth: Payer: Self-pay | Admitting: Internal Medicine

## 2022-03-16 NOTE — Telephone Encounter (Signed)
Scheduled appt per 9/20 referral. Pt already established with Dr. Julien Nordmann for the same dx. R/s pt's cancelled appts from June. Pt is aware of appts date/time.

## 2022-03-19 DIAGNOSIS — R7989 Other specified abnormal findings of blood chemistry: Secondary | ICD-10-CM | POA: Insufficient documentation

## 2022-03-19 DIAGNOSIS — M25521 Pain in right elbow: Secondary | ICD-10-CM | POA: Insufficient documentation

## 2022-03-19 DIAGNOSIS — E538 Deficiency of other specified B group vitamins: Secondary | ICD-10-CM | POA: Insufficient documentation

## 2022-03-19 DIAGNOSIS — Z1231 Encounter for screening mammogram for malignant neoplasm of breast: Secondary | ICD-10-CM | POA: Insufficient documentation

## 2022-03-19 NOTE — Assessment & Plan Note (Signed)
Check thyroid panel 

## 2022-03-19 NOTE — Assessment & Plan Note (Signed)
Try topical Voltaren gel. Ice as needed. Follow up if not improving.

## 2022-03-19 NOTE — Assessment & Plan Note (Signed)
Recheck today after taking supplement

## 2022-03-19 NOTE — Assessment & Plan Note (Signed)
Continue supplement. Check Mg

## 2022-03-19 NOTE — Assessment & Plan Note (Signed)
She will call to schedule

## 2022-03-19 NOTE — Assessment & Plan Note (Signed)
Controlled. Continue amlodipine 5 mg and lisinopril 30 mg daily.

## 2022-03-19 NOTE — Progress Notes (Signed)
Please call her and let her know that after further review of her chart, she has been seen by hematology for anemia. She was advised to take folic acid and follow up with them. Please ask her to schedule a follow up and I will cancel my new patient referral.

## 2022-03-19 NOTE — Assessment & Plan Note (Signed)
She did not recall seeing hematology for anemia but after further review of her chart, she did see them in March 2023. She was advised to take folic acid and follow up in May but she does not appear to have done this. Recommend she call and schedule.

## 2022-03-20 ENCOUNTER — Ambulatory Visit: Payer: 59 | Admitting: Internal Medicine

## 2022-03-27 ENCOUNTER — Other Ambulatory Visit: Payer: Self-pay

## 2022-03-27 DIAGNOSIS — D509 Iron deficiency anemia, unspecified: Secondary | ICD-10-CM

## 2022-03-28 ENCOUNTER — Telehealth: Payer: Self-pay | Admitting: Family Medicine

## 2022-03-28 ENCOUNTER — Inpatient Hospital Stay: Payer: 59 | Attending: Internal Medicine

## 2022-03-28 ENCOUNTER — Other Ambulatory Visit: Payer: Self-pay

## 2022-03-28 ENCOUNTER — Inpatient Hospital Stay (HOSPITAL_BASED_OUTPATIENT_CLINIC_OR_DEPARTMENT_OTHER): Payer: 59 | Admitting: Internal Medicine

## 2022-03-28 ENCOUNTER — Other Ambulatory Visit (HOSPITAL_COMMUNITY): Payer: Self-pay

## 2022-03-28 ENCOUNTER — Encounter: Payer: Self-pay | Admitting: Internal Medicine

## 2022-03-28 ENCOUNTER — Ambulatory Visit: Payer: 59 | Admitting: Internal Medicine

## 2022-03-28 ENCOUNTER — Other Ambulatory Visit: Payer: 59

## 2022-03-28 VITALS — BP 135/87 | HR 86 | Temp 98.7°F | Resp 17 | Ht <= 58 in | Wt 135.2 lb

## 2022-03-28 DIAGNOSIS — R5383 Other fatigue: Secondary | ICD-10-CM | POA: Insufficient documentation

## 2022-03-28 DIAGNOSIS — D509 Iron deficiency anemia, unspecified: Secondary | ICD-10-CM | POA: Insufficient documentation

## 2022-03-28 DIAGNOSIS — Z79899 Other long term (current) drug therapy: Secondary | ICD-10-CM | POA: Insufficient documentation

## 2022-03-28 LAB — CMP (CANCER CENTER ONLY)
ALT: 50 U/L — ABNORMAL HIGH (ref 0–44)
AST: 42 U/L — ABNORMAL HIGH (ref 15–41)
Albumin: 4 g/dL (ref 3.5–5.0)
Alkaline Phosphatase: 75 U/L (ref 38–126)
Anion gap: 5 (ref 5–15)
BUN: 16 mg/dL (ref 6–20)
CO2: 30 mmol/L (ref 22–32)
Calcium: 8.6 mg/dL — ABNORMAL LOW (ref 8.9–10.3)
Chloride: 107 mmol/L (ref 98–111)
Creatinine: 0.68 mg/dL (ref 0.44–1.00)
GFR, Estimated: >60 mL/min (ref >60)
Glucose, Bld: 126 mg/dL — ABNORMAL HIGH (ref 70–99)
Potassium: 3.7 mmol/L (ref 3.5–5.1)
Sodium: 142 mmol/L (ref 135–145)
Total Bilirubin: 0.4 mg/dL (ref 0.3–1.2)
Total Protein: 6.5 g/dL (ref 6.5–8.1)

## 2022-03-28 LAB — CBC WITH DIFFERENTIAL (CANCER CENTER ONLY)
Abs Immature Granulocytes: 0.01 10*3/uL (ref 0.00–0.07)
Basophils Absolute: 0 10*3/uL (ref 0.0–0.1)
Basophils Relative: 0 %
Eosinophils Absolute: 0.1 10*3/uL (ref 0.0–0.5)
Eosinophils Relative: 2 %
HCT: 33.2 % — ABNORMAL LOW (ref 36.0–46.0)
Hemoglobin: 10.6 g/dL — ABNORMAL LOW (ref 12.0–15.0)
Immature Granulocytes: 0 %
Lymphocytes Relative: 44 %
Lymphs Abs: 2.3 10*3/uL (ref 0.7–4.0)
MCH: 22.6 pg — ABNORMAL LOW (ref 26.0–34.0)
MCHC: 31.9 g/dL (ref 30.0–36.0)
MCV: 70.8 fL — ABNORMAL LOW (ref 80.0–100.0)
Monocytes Absolute: 0.4 10*3/uL (ref 0.1–1.0)
Monocytes Relative: 7 %
Neutro Abs: 2.5 10*3/uL (ref 1.7–7.7)
Neutrophils Relative %: 47 %
Platelet Count: 246 10*3/uL (ref 150–400)
RBC: 4.69 MIL/uL (ref 3.87–5.11)
RDW: 14.6 % (ref 11.5–15.5)
WBC Count: 5.4 10*3/uL (ref 4.0–10.5)
nRBC: 0 % (ref 0.0–0.2)

## 2022-03-28 LAB — IRON AND IRON BINDING CAPACITY (CC-WL,HP ONLY)
Iron: 132 ug/dL (ref 28–170)
Saturation Ratios: 35 % — ABNORMAL HIGH (ref 10.4–31.8)
TIBC: 372 ug/dL (ref 250–450)
UIBC: 240 ug/dL (ref 148–442)

## 2022-03-28 LAB — FERRITIN: Ferritin: 69 ng/mL (ref 11–307)

## 2022-03-28 LAB — VITAMIN B12: Vitamin B-12: 437 pg/mL (ref 180–914)

## 2022-03-28 LAB — FOLATE: Folate: 15.1 ng/mL (ref >5.9)

## 2022-03-28 NOTE — Telephone Encounter (Signed)
Patient wants to know how much B12 and how much magnesium that she should take.  Please advise.

## 2022-03-28 NOTE — Progress Notes (Signed)
[place] [phone]   [phone]  OFFICE PROGRESS NOTE  Girtha Rm, NP-C [place] [id number]  DIAGNOSIS: Microcytic anemia with history of iron deficiency  PRIOR THERAPY: None  CURRENT THERAPY: Over-the-counter oral iron tablets in addition to folic acid once daily  INTERVAL HISTORY: [name] 61 y.o. female returns to the clinic today for follow-up visit.  The patient is feeling fine today with no concerning complaints except for mild fatigue.  She denied having any current chest pain, shortness of breath, cough or hemoptysis.  She has no nausea, vomiting, diarrhea or constipation.  She has no headache or visual changes.  She is here today for evaluation and repeat blood work.  MEDICAL HISTORY: Past Medical History:  Diagnosis Date   Anemia    Arthritis    "neck, hips" (04/25/2018)   Claustrophobia    Daily headache    GERD (gastroesophageal reflux disease)    Hiatal hernia    Hyperlipidemia    "hx" (04/25/2018)   Hypertension    Plantar fasciitis    hx - left foot   Stroke ([place]) 05/2014   "mini stroke" (04/25/2018)    ALLERGIES:  is allergic to aspirin, hydromorphone hcl, and latex.  MEDICATIONS:  Current Outpatient Medications  Medication Sig Dispense Refill   amLODipine (NORVASC) 5 MG tablet Take 1 tablet (5 mg total) by mouth daily. 90 tablet 3   amoxicillin (AMOXIL) 875 MG tablet Take 1 tablet (875 mg total) by mouth every 12 (twelve) hours (2 times per day) until gone 20 tablet 0   diclofenac Sodium (VOLTAREN) 1 % GEL Apply 2 g topically 4 (four) times daily. 200 g 1   Diclofenac Sodium 3 % GEL Apply 1 application topically 3 (three) times daily as needed. 1001 g 1   diltiazem 2 % GEL Apply 1 Application topically 5 (five) times daily. 30 g 1   fluticasone (FLONASE) 50 MCG/ACT nasal spray Place 2 sprays into both nostrils daily. 16 g 1   folic acid (FOLVITE) 1 MG tablet Take 1 tablet  (1 mg total) by mouth daily. 30 tablet 2   HYDROcodone-acetaminophen (NORCO/VICODIN) 5-325 MG tablet Take 1 tablet by mouth every 4-6 hours as needed for pain (Patient not taking: Reported on 03/15/2022) 20 tablet 0   ibuprofen (ADVIL) 800 MG tablet Take 1 tablet (800 mg total) by mouth every 6-8 hours. Do not exceed 3200 mg/day (4 tablets/day) 30 tablet 0   levocetirizine (XYZAL) 5 MG tablet Take 1 tablet (5 mg total) by mouth every evening. 30 tablet 1   lipase/protease/amylase (CREON) 36000 UNITS CPEP capsule Take 3 capsules (108,000 Units total) by mouth 3 (three) times daily with meals AND 2 capsules (72,000 Units total) with snacks. 330 capsule 6   lisinopril (ZESTRIL) 30 MG tablet Take 1 tablet (30 mg total) by mouth daily. 90 tablet 3   ondansetron (ZOFRAN-ODT) 4 MG disintegrating tablet Take 1 tablet (4 mg total) by mouth every 8 (eight) hours as needed for nausea or vomiting. 21 tablet 0   pantoprazole (PROTONIX) 40 MG tablet Take 1 tablet (40 mg total) by mouth 2 (two) times daily. Take 30 minutes before breakfast. 60 tablet 1   predniSONE (DELTASONE) 10 MG tablet Taper dose follow enclosed instructions 48 tablet 0   rosuvastatin (CRESTOR) 20 MG tablet Take 1 tablet (20 mg total) by mouth daily. 90 tablet 3   No current facility-administered medications for this visit.  SURGICAL HISTORY:  Past Surgical History:  Procedure Laterality Date   ABDOMINAL HYSTERECTOMY  12/09   Secondary to fibroids   ARTERY BIOPSY Right 04/10/2018   Procedure: BIOPSY TEMPORAL ARTERY;  Surgeon: Rosetta Posner, MD;  Location: [facility];  Service: Vascular;  Laterality: Right;   [place]; 1988   COLONOSCOPY     FOOT SURGERY Left 03/2017   Bone Spurs Removed   LUMBAR LAMINECTOMY/DECOMPRESSION MICRODISCECTOMY N/A 04/13/2021   Procedure: LUMBAR FOUR- LUMBAR FIVE AND LUMBAR FIVE-SACRAL ONE  DECOMPRESSION;  Surgeon: Marybelle Killings, MD;  Location: [facility];  Service: Orthopedics;  Laterality: N/A;    RADIOLOGY WITH ANESTHESIA N/A 04/26/2018   Procedure: MRI WITH ANESTHESIA;  Surgeon: Radiologist, Medication, MD;  Location: [facility];  Service: Radiology;  Laterality: N/A;   RADIOLOGY WITH ANESTHESIA N/A 08/24/2019   Procedure: MRI WITH ANESTHESIA CERVICAL SPINE;  Surgeon: Radiologist, Medication, MD;  Location: [facility];  Service: Radiology;  Laterality: N/A;   RADIOLOGY WITH ANESTHESIA N/A 10/02/2019   Procedure: MRI WITH ANESTHESIA CERVICAL WITHOUT CONTRAST;  Surgeon: Radiologist, Medication, MD;  Location: [facility];  Service: Radiology;  Laterality: N/A;   TUBAL LIGATION  1988    REVIEW OF SYSTEMS:  A comprehensive review of systems was negative except for: Constitutional: positive for fatigue   PHYSICAL EXAMINATION: General appearance: alert, cooperative, fatigued, and no distress Head: Normocephalic, without obvious abnormality, atraumatic Neck: no adenopathy, no JVD, supple, symmetrical, trachea midline, and thyroid not enlarged, symmetric, no tenderness/mass/nodules Lymph nodes: Cervical, supraclavicular, and axillary nodes normal. Resp: clear to auscultation bilaterally Back: symmetric, no curvature. ROM normal. No CVA tenderness. Cardio: regular rate and rhythm, S1, S2 normal, no murmur, click, rub or gallop GI: soft, non-tender; bowel sounds normal; no masses,  no organomegaly Extremities: extremities normal, atraumatic, no cyanosis or edema  ECOG PERFORMANCE STATUS: 1 - Symptomatic but completely ambulatory  Blood pressure 135/87, pulse 86, temperature 98.7 F (37.1 C), temperature source Oral, resp. rate 17, height '4\' 10"'$  (1.473 m), weight 135 lb 3 oz (61.3 kg), SpO2 100 %.  LABORATORY DATA: Lab Results  Component Value Date   WBC 5.4 03/28/2022   HGB 10.6 (L) 03/28/2022   HCT 33.2 (L) 03/28/2022   MCV 70.8 (L) 03/28/2022   PLT 246 03/28/2022      Chemistry      Component Value Date/Time   NA 140 12/02/2021 1619   NA 143 02/18/2020 1429   K 3.8 12/02/2021 1619   CL 106  12/02/2021 1619   CO2 26 12/02/2021 1619   BUN 19 12/02/2021 1619   BUN 13 02/18/2020 1429   CREATININE 0.73 12/02/2021 1619   CREATININE 0.54 09/19/2021 1259   CREATININE 0.60 04/23/2014 0910      Component Value Date/Time   CALCIUM 8.9 12/02/2021 1619   ALKPHOS 81 12/02/2021 1619   ALKPHOS 81 12/02/2021 1619   AST 103 (H) 12/02/2021 1619   AST 103 (H) 12/02/2021 1619   AST 62 (H) 09/19/2021 1259   ALT 140 (H) 12/02/2021 1619   ALT 140 (H) 12/02/2021 1619   ALT 78 (H) 09/19/2021 1259   BILITOT 0.3 12/02/2021 1619   BILITOT 0.3 12/02/2021 1619   BILITOT 0.3 09/19/2021 1259       RADIOGRAPHIC STUDIES: No results found.  ASSESSMENT AND PLAN: This is a very pleasant 61 years old African-American female with history of microcytic anemia of unclear etiology.  The patient had extensive work-up performed in the past including iron study and  ferritin that were unremarkable.  She also had hemoglobin electrophoresis that showed no evidence for basal cell ischemia or sickle cell trait.  The only abnormality was found on her previous blood work was low serum folate and she is currently on oral iron tablet as well as folic acid and tolerating it fairly well. Repeat CBC today showed persistent anemia with hemoglobin of 10.6 and hematocrit of 33.2 and MCV of 70.8. I recommended for the patient to continue with the oral iron and folic acid supplements as planned. I will see her back for follow-up visit in 3 months with repeat CBC, iron study and ferritin in addition to serum copper and ceruloplasmin.  Had heavy metals performed in the past that were unremarkable. The patient was advised to call immediately if she has any other concerning symptoms in the interval. The patient voices understanding of current disease status and treatment options and is in agreement with the current care plan.  All questions were answered. The patient knows to call the clinic with any problems, questions or concerns.  We can certainly see the patient much sooner if necessary.  The total time spent in the appointment was 20 minutes.  Disclaimer: This note was dictated with voice recognition software. Similar sounding words can inadvertently be transcribed and may not be corrected upon review.

## 2022-03-28 NOTE — Telephone Encounter (Signed)
Pt is inquiring about how much B12 and Magnesium she should take. Based on lab results 12/02/21 pt should be taking magnesium OTC 400 mg daily and B12 1,000 mcg daily. Do you wish for pt to continue this?

## 2022-03-29 ENCOUNTER — Ambulatory Visit: Payer: 59 | Admitting: Internal Medicine

## 2022-03-29 ENCOUNTER — Other Ambulatory Visit: Payer: 59

## 2022-03-29 NOTE — Telephone Encounter (Signed)
Called pt and relayed dosage information below. Pt verbalized understanding.

## 2022-03-30 ENCOUNTER — Ambulatory Visit: Payer: 59 | Admitting: Physical Medicine and Rehabilitation

## 2022-04-10 ENCOUNTER — Telehealth: Payer: Self-pay

## 2022-04-10 ENCOUNTER — Other Ambulatory Visit (HOSPITAL_COMMUNITY): Payer: Self-pay

## 2022-04-10 NOTE — Telephone Encounter (Signed)
Patient wants to know if she needs to continue her cholesterol medication, patient does not have bottle with her.

## 2022-04-11 NOTE — Telephone Encounter (Signed)
Pt reports she has been continuously taking this and was going in for a refill but was just wondering since Dr. Mitchel Honour prescribed it, she just wanted to make sure her provider was okay with her continuing this for that time period. Would you still like for her to f/u with you for fasting appt with labs in 6 weeks?

## 2022-04-12 NOTE — Telephone Encounter (Signed)
Called and was able to schedule 6 week fasting f/u for November

## 2022-04-12 NOTE — Telephone Encounter (Signed)
ATC x2 to book 6 week fasting f/u. Each time phone says "please try your call again later"  Will try again later

## 2022-04-14 ENCOUNTER — Telehealth: Payer: Self-pay | Admitting: Family Medicine

## 2022-04-14 ENCOUNTER — Telehealth: Payer: Self-pay | Admitting: Orthopaedic Surgery

## 2022-04-14 NOTE — Telephone Encounter (Signed)
I called, no answer, no voicemail.

## 2022-04-14 NOTE — Telephone Encounter (Signed)
Patient advising that she is having back pain and would like for someone to call her and tell what she should take for pain. Call [phone]

## 2022-04-17 ENCOUNTER — Other Ambulatory Visit (HOSPITAL_COMMUNITY): Payer: Self-pay

## 2022-04-18 NOTE — Telephone Encounter (Signed)
I called x 3 with no answer, no voicemail. Will wait for patient to return call.

## 2022-04-19 ENCOUNTER — Ambulatory Visit (HOSPITAL_COMMUNITY)
Admission: EM | Admit: 2022-04-19 | Discharge: 2022-04-19 | Disposition: A | Discharge status: Home / Self Care | Payer: 59 | Attending: Internal Medicine | Admitting: Internal Medicine

## 2022-04-19 ENCOUNTER — Encounter (HOSPITAL_COMMUNITY): Payer: Self-pay | Admitting: Emergency Medicine

## 2022-04-19 DIAGNOSIS — U071 COVID-19: Secondary | ICD-10-CM | POA: Insufficient documentation

## 2022-04-19 DIAGNOSIS — B349 Viral infection, unspecified: Secondary | ICD-10-CM

## 2022-04-19 LAB — SARS CORONAVIRUS 2 (TAT 6-24 HRS): SARS Coronavirus 2: POSITIVE — AB

## 2022-04-19 LAB — POC INFLUENZA A AND B ANTIGEN (URGENT CARE ONLY)
INFLUENZA A ANTIGEN, POC: NEGATIVE
INFLUENZA B ANTIGEN, POC: NEGATIVE

## 2022-04-19 MED ORDER — ACETAMINOPHEN 325 MG PO TABS
ORAL_TABLET | ORAL | Status: AC
  Filled 2022-04-19: qty 1

## 2022-04-19 MED ORDER — ACETAMINOPHEN 325 MG PO TABS
650.0000 mg | ORAL_TABLET | Freq: Once | ORAL | Status: AC
  Administered 2022-04-19: 650 mg via ORAL

## 2022-04-19 NOTE — Discharge Instructions (Addendum)
Your symptoms today are most likely being caused by a virus and should steadily improve in time it can take up to 7 to 10 days before you truly start to see a turnaround however things will get better    You can take Tylenol and/or Ibuprofen as needed for fever reduction and pain relief.   For cough: honey 1/2 to 1 teaspoon (you can dilute the honey in water or another fluid).  You can also use guaifenesin and dextromethorphan for cough. You can use a humidifier for chest congestion and cough.  If you don't have a humidifier, you can sit in the bathroom with the hot shower running.      For sore throat: try warm salt water gargles, cepacol lozenges, throat spray, warm tea or water with lemon/honey, popsicles or ice, or OTC cold relief medicine for throat discomfort.   For congestion: take a daily anti-histamine like Zyrtec, Claritin, and a oral decongestant, such as pseudoephedrine.  You can also use Flonase 1-2 sprays in each nostril daily.   It is important to stay hydrated: drink plenty of fluids (water, gatorade/powerade/pedialyte, juices, or teas) to keep your throat moisturized and help further relieve irritation/discomfort.  

## 2022-04-19 NOTE — ED Triage Notes (Signed)
Pt reports a sore throat, cough, headache, chills and episodes of emesis. Symptom onset around 1 am this morning.

## 2022-04-19 NOTE — ED Provider Notes (Signed)
[facility]   [id number] 04/19/22 Arrival Time: 0827  ASSESSMENT & PLAN:  1. Viral illness   -Clinical picture most consistent with a viral illness.  I suspect COVID.  COVID test is pending.  Point-of-care flu testing today was negative.  I recommended supportive care including vigorous p.o. hydration, rest, Tylenol/ibuprofen as needed for aches.  Recommended good hand hygiene.  Advised to stay out of work until she has been afebrile for 24 hours.  We will call her for COVID test is positive.  All questions answered she agrees to plan.   Meds ordered this encounter  Medications   acetaminophen (TYLENOL) tablet 650 mg     Discharge Instructions      Your symptoms today are most likely being caused by a virus and should steadily improve in time it can take up to 7 to 10 days before you truly start to see a turnaround however things will get better    You can take Tylenol and/or Ibuprofen as needed for fever reduction and pain relief.   For cough: honey 1/2 to 1 teaspoon (you can dilute the honey in water or another fluid).  You can also use guaifenesin and dextromethorphan for cough. You can use a humidifier for chest congestion and cough.  If you don't have a humidifier, you can sit in the bathroom with the hot shower running.      For sore throat: try warm salt water gargles, cepacol lozenges, throat spray, warm tea or water with lemon/honey, popsicles or ice, or OTC cold relief medicine for throat discomfort.   For congestion: take a daily anti-histamine like Zyrtec, Claritin, and a oral decongestant, such as pseudoephedrine.  You can also use Flonase 1-2 sprays in each nostril daily.   It is important to stay hydrated: drink plenty of fluids (water, gatorade/powerade/pedialyte, juices, or teas) to keep your throat moisturized and help further relieve irritation/discomfort.         Reviewed expectations re: course of current medical issues. Questions  answered. Outlined signs and symptoms indicating need for more acute intervention. Patient verbalized understanding. After Visit Summary given.   SUBJECTIVE: Very pleasant 61-year-old female comes urgent care for evaluation of subjective fever, chills, sweats, full body aches and emesis.  Symptoms started around 1 AM this morning.  She reports she was in normal health until this morning.  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  This morning she woke up with sweats, chills, aches, nausea.  She had 2 episodes of none bloody vomiting this morning.  She tried to go to work but was not feeling well so came here for evaluation.  She has not tried taking any medications.  No LMP recorded (lmp unknown). Patient has had a hysterectomy. Past Surgical History:  Procedure Laterality Date   ABDOMINAL HYSTERECTOMY  12/09   Secondary to fibroids   ARTERY BIOPSY Right 04/10/2018   Procedure: BIOPSY TEMPORAL ARTERY;  Surgeon: Rosetta Posner, MD;  Location: [facility];  Service: Vascular;  Laterality: Right;   [place]; 1988   COLONOSCOPY     FOOT SURGERY Left 03/2017   Bone Spurs Removed   LUMBAR LAMINECTOMY/DECOMPRESSION MICRODISCECTOMY N/A 04/13/2021   Procedure: LUMBAR FOUR- LUMBAR FIVE AND LUMBAR FIVE-SACRAL ONE  DECOMPRESSION;  Surgeon: Marybelle Killings, MD;  Location: [facility];  Service: Orthopedics;  Laterality: N/A;   RADIOLOGY WITH ANESTHESIA N/A 04/26/2018   Procedure: MRI WITH ANESTHESIA;  Surgeon:  Radiologist, Medication, MD;  Location: [facility];  Service: Radiology;  Laterality: N/A;   RADIOLOGY WITH ANESTHESIA N/A 08/24/2019   Procedure: MRI WITH ANESTHESIA CERVICAL SPINE;  Surgeon: Radiologist, Medication, MD;  Location: [facility];  Service: Radiology;  Laterality: N/A;   RADIOLOGY WITH ANESTHESIA N/A 10/02/2019   Procedure: MRI WITH ANESTHESIA CERVICAL WITHOUT CONTRAST;  Surgeon: Radiologist, Medication, MD;  Location: [facility];  Service: Radiology;   Laterality: N/A;   TUBAL LIGATION  1988     OBJECTIVE:  Vitals:   04/19/22 0859  BP: 137/84  Pulse: 87  Resp: 16  Temp: 98.4 F (36.9 C)  TempSrc: Oral  SpO2: 98%     Physical Exam Vitals and nursing note reviewed.  Constitutional:      Appearance: She is not ill-appearing.  HENT:     Head: Normocephalic.     Right Ear: Tympanic membrane normal.     Left Ear: Tympanic membrane normal.     Nose: No congestion.     Mouth/Throat:     Mouth: Mucous membranes are moist.     Pharynx: No oropharyngeal exudate or posterior oropharyngeal erythema.  Eyes:     Conjunctiva/sclera: Conjunctivae normal.  Cardiovascular:     Rate and Rhythm: Normal rate.     Heart sounds: Normal heart sounds.  Pulmonary:     Effort: Pulmonary effort is normal.     Breath sounds: Normal breath sounds.  Abdominal:     General: Bowel sounds are normal.     Palpations: Abdomen is soft.  Skin:    General: Skin is warm.  Neurological:     General: No focal deficit present.     Mental Status: She is alert.  Psychiatric:        Mood and Affect: Mood normal.      Labs: Results for orders placed or performed during the hospital encounter of 04/19/22  POC Influenza A & B Ag (Urgent Care)  Result Value Ref Range   INFLUENZA A ANTIGEN, POC NEGATIVE NEGATIVE   INFLUENZA B ANTIGEN, POC NEGATIVE NEGATIVE   Labs Reviewed  SARS CORONAVIRUS 2 (TAT 6-24 HRS)  POC INFLUENZA A AND B ANTIGEN (URGENT CARE ONLY)    Imaging: No results found.   Allergies  Allergen Reactions   Aspirin Hives   Hydromorphone Hcl Hives and Nausea And Vomiting   Latex Rash                                               Past Medical History:  Diagnosis Date   Anemia    Arthritis    "neck, hips" (04/25/2018)   Claustrophobia    Daily headache    GERD (gastroesophageal reflux disease)    Hiatal hernia    Hyperlipidemia    "hx" (04/25/2018)   Hypertension    Plantar fasciitis    hx - left foot   Stroke ([place])  05/2014   "mini stroke" (04/25/2018)    Social History   Socioeconomic History   Marital status: Divorced    Spouse name: Not on file   Number of children: Not on file   Years of education: Not on file   Highest education level: Not on file  Occupational History   Occupation: unemployed    Comment: used to work in a [employer]  Tobacco Use   Smoking status: Some Days  Packs/day: 0.25    Years: 38.00    Total pack years: 9.50    Types: Cigarettes   Smokeless tobacco: Never   Tobacco comments:    0-4 cigs/day   Vaping Use   Vaping Use: Never used  Substance and Sexual Activity   Alcohol use: Yes    Alcohol/week: 4.0 standard drinks of alcohol    Types: 4 Cans of beer per week   Drug use: Not Currently   Sexual activity: Not Currently    Birth control/protection: Surgical    Comment: Hysterectomy  Other Topics Concern   Not on file  Social History Narrative   10 th grade education.   Social Determinants of Health   Financial Resource Strain: Not on file  Food Insecurity: Not on file  Transportation Needs: No Transportation Needs (08/27/2019)   PRAPARE - Hydrologist (Medical): No    Lack of Transportation (Non-Medical): No  Physical Activity: Not on file  Stress: Not on file  Social Connections: Not on file  Intimate Partner Violence: Not on file    Family History  Problem Relation Age of Onset   Cancer Mother 77       pancreatic cancer   Pancreatic cancer Mother    Heart disease Mother    Cancer Father 34       throat cancer   Esophageal cancer Father    Cancer Maternal Aunt 28       breast cancer   Cancer Other        Died  from Colon cancer in 60's   Colon cancer Maternal Uncle    Colon cancer Paternal Uncle    Heart disease Sister    Rectal cancer Neg Hx    Stomach cancer Neg Hx       Reighlyn Elmes, Dorian Pod, MD 04/19/22 1034

## 2022-04-20 ENCOUNTER — Other Ambulatory Visit (HOSPITAL_COMMUNITY): Payer: Self-pay

## 2022-04-20 ENCOUNTER — Encounter (HOSPITAL_COMMUNITY): Payer: Self-pay

## 2022-04-20 ENCOUNTER — Ambulatory Visit (HOSPITAL_COMMUNITY)
Admission: EM | Admit: 2022-04-20 | Discharge: 2022-04-20 | Disposition: A | Discharge status: Home / Self Care | Payer: 59 | Attending: Emergency Medicine | Admitting: Emergency Medicine

## 2022-04-20 ENCOUNTER — Ambulatory Visit: Payer: 59 | Admitting: Family Medicine

## 2022-04-20 ENCOUNTER — Telehealth: Payer: Self-pay | Admitting: Family Medicine

## 2022-04-20 DIAGNOSIS — B349 Viral infection, unspecified: Secondary | ICD-10-CM

## 2022-04-20 DIAGNOSIS — U071 COVID-19: Secondary | ICD-10-CM | POA: Diagnosis not present

## 2022-04-20 MED ORDER — NIRMATRELVIR/RITONAVIR (PAXLOVID)TABLET
3.0000 | ORAL_TABLET | Freq: Two times a day (BID) | ORAL | 0 refills | Status: AC
  Filled 2022-04-20: qty 30, 5d supply, fill #0

## 2022-04-20 NOTE — Telephone Encounter (Signed)
Pt called to report positive COVID test result. Pt canceled today's appt and wanted to advise provider of the diagnosis received today.

## 2022-04-20 NOTE — ED Provider Notes (Signed)
[place]    CSN: [id number] Arrival date & time: 04/20/22  1149     History   Chief Complaint Chief Complaint  Patient presents with   Covid Positive    HPI [name] is a 61 y.o. female.  Presents after being seen in this clinic yesterday  Positive covid test Her primary care told her to follow up with [place]. She is here to receive medication Reports feeling a little worse than yesterday, productive cough  Last GFR was > 60  Past Medical History:  Diagnosis Date   Anemia    Arthritis    "neck, hips" (04/25/2018)   Claustrophobia    Daily headache    GERD (gastroesophageal reflux disease)    Hiatal hernia    Hyperlipidemia    "hx" (04/25/2018)   Hypertension    Plantar fasciitis    hx - left foot   Stroke ([place]) 05/2014   "mini stroke" (04/25/2018)    Patient Active Problem List   Diagnosis Date Noted   Hypomagnesemia 03/19/2022   Vitamin B 12 deficiency 03/19/2022   Low TSH level 03/19/2022   Right elbow pain 03/19/2022   Encounter for screening mammogram for malignant neoplasm of breast 03/19/2022   History of hypertension 12/13/2021   History of smoking 12/13/2021   Elevated blood pressure reading 12/13/2021   Contusion of right knee 12/13/2021   Fall 12/13/2021   Muscle cramps 12/04/2021   Elevated liver enzymes 12/04/2021   Paresthesias 12/04/2021   Elevated LFTs 12/04/2021   Chronic anemia 10/20/2021   Chronic foot pain 46/96/2952   Folic acid deficiency 84/13/2440   Chronic pain syndrome 09/07/2021   Status post lumbar spine surgery for decompression of spinal cord 05/24/2021   Dyspepsia 05/23/2021   History of gastroesophageal reflux (GERD) 05/23/2021   History of lumbar laminectomy for spinal cord decompression 04/26/2021   Lumbar stenosis 04/13/2021   Tension headache 12/22/2020   Low back pain 10/20/2020   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/28/2020   Chronic diarrhea 09/14/2017   History of stroke 06/12/2014    Cervical radiculitis 02/12/2014   Essential hypertension, benign 01/24/2010   Microcytic anemia 01/11/2010   Hyperlipemia 05/28/2007   Current smoker 05/28/2007   GERD 05/28/2007    Past Surgical History:  Procedure Laterality Date   ABDOMINAL HYSTERECTOMY  12/09   Secondary to fibroids   ARTERY BIOPSY Right 04/10/2018   Procedure: BIOPSY TEMPORAL ARTERY;  Surgeon: Rosetta Posner, MD;  Location: [facility] OR;  Service: Vascular;  Laterality: Right;   [place]; 1988   COLONOSCOPY     FOOT SURGERY Left 03/2017   Bone Spurs Removed   LUMBAR LAMINECTOMY/DECOMPRESSION MICRODISCECTOMY N/A 04/13/2021   Procedure: LUMBAR FOUR- LUMBAR FIVE AND LUMBAR FIVE-SACRAL ONE  DECOMPRESSION;  Surgeon: Marybelle Killings, MD;  Location: [place];  Service: Orthopedics;  Laterality: N/A;   RADIOLOGY WITH ANESTHESIA N/A 04/26/2018   Procedure: MRI WITH ANESTHESIA;  Surgeon: Radiologist, Medication, MD;  Location: [place];  Service: Radiology;  Laterality: N/A;   RADIOLOGY WITH ANESTHESIA N/A 08/24/2019   Procedure: MRI WITH ANESTHESIA CERVICAL SPINE;  Surgeon: Radiologist, Medication, MD;  Location: [place];  Service: Radiology;  Laterality: N/A;   RADIOLOGY WITH ANESTHESIA N/A 10/02/2019   Procedure: MRI WITH ANESTHESIA CERVICAL WITHOUT CONTRAST;  Surgeon: Radiologist, Medication, MD;  Location: [place];  Service: Radiology;  Laterality: N/A;   TUBAL LIGATION  1988    OB History   No obstetric history on file.  Home Medications    Prior to Admission medications   Medication Sig Start Date End Date Taking? Authorizing Provider  nirmatrelvir/ritonavir EUA (PAXLOVID) 20 x 150 MG & 10 x '100MG'$  TABS Take 3 tablets by mouth 2 (two) times daily for 5 days  (2 tabs of nirmatrelvir (150 mg) and 1 tab of ritonavir (100 mg)) 04/20/22 04/25/22 Yes Crews Mccollam, PA-C  amLODipine (NORVASC) 5 MG tablet Take 1 tablet (5 mg total) by mouth daily. 06/10/21   Horald Pollen, MD  diclofenac Sodium (VOLTAREN) 1 %  GEL Apply 2 g topically 4 (four) times daily. 03/15/22   Henson, Vickie L, NP-C  fluticasone (FLONASE) 50 MCG/ACT nasal spray Place 2 sprays into both nostrils daily. 11/02/21   Henson, Vickie L, NP-C  folic acid (FOLVITE) 1 MG tablet Take 1 tablet (1 mg total) by mouth daily. 02/06/22   Heilingoetter, Cassandra L, PA-C  lipase/protease/amylase (CREON) 36000 UNITS CPEP capsule Take 3 capsules (108,000 Units total) by mouth 3 (three) times daily with meals AND 2 capsules (72,000 Units total) with snacks. 09/26/21   Irene Shipper, MD  lisinopril (ZESTRIL) 30 MG tablet Take 1 tablet (30 mg total) by mouth daily. 10/20/21   Horald Pollen, MD  pantoprazole (PROTONIX) 40 MG tablet Take 1 tablet (40 mg total) by mouth 2 (two) times daily. Take 30 minutes before breakfast. 03/01/22   Noralyn Pick, NP  potassium chloride SA (KLOR-CON M) 20 MEQ tablet Take 1 tablet (20 mEq total) by mouth daily. 09/19/21 10/31/21  Heilingoetter, Cassandra L, PA-C    Family History Family History  Problem Relation Age of Onset   Cancer Mother 23       pancreatic cancer   Pancreatic cancer Mother    Heart disease Mother    Cancer Father 83       throat cancer   Esophageal cancer Father    Cancer Maternal Aunt 75       breast cancer   Cancer Other        Died  from Colon cancer in 60's   Colon cancer Maternal Uncle    Colon cancer Paternal Uncle    Heart disease Sister    Rectal cancer Neg Hx    Stomach cancer Neg Hx     Social History Social History   Tobacco Use   Smoking status: Some Days    Packs/day: 0.25    Years: 38.00    Total pack years: 9.50    Types: Cigarettes   Smokeless tobacco: Never   Tobacco comments:    0-4 cigs/day   Vaping Use   Vaping Use: Never used  Substance Use Topics   Alcohol use: Yes    Alcohol/week: 4.0 standard drinks of alcohol    Types: 4 Cans of beer per week   Drug use: Not Currently     Allergies   Aspirin, Hydromorphone hcl, and Latex   Review of  Systems Review of Systems   Physical Exam Triage Vital Signs ED Triage Vitals  Enc Vitals Group     BP      Pulse      Resp      Temp      Temp src      SpO2      Weight      Height      Head Circumference      Peak Flow      Pain Score      Pain Loc  Pain Edu?      Excl. in [place]?    No data found.  Updated Vital Signs BP (!) 150/103 (BP Location: Right Arm)   Pulse 98   Temp 99 F (37.2 C) (Oral)   Resp 18   LMP  (LMP Unknown)   SpO2 99%      Physical Exam Constitutional:      General: She is not in acute distress.    Appearance: Normal appearance.  HENT:     Nose: Congestion present.     Mouth/Throat:     Mouth: Mucous membranes are moist.     Pharynx: Oropharynx is clear.  Eyes:     Conjunctiva/sclera: Conjunctivae normal.  Cardiovascular:     Rate and Rhythm: Normal rate and regular rhythm.     Pulses: Normal pulses.     Heart sounds: Normal heart sounds.  Pulmonary:     Effort: Pulmonary effort is normal. No respiratory distress.     Breath sounds: Normal breath sounds. No wheezing, rhonchi or rales.  Musculoskeletal:        General: Normal range of motion.  Lymphadenopathy:     Cervical: No cervical adenopathy.  Skin:    General: Skin is warm and dry.  Neurological:     Mental Status: She is alert and oriented to person, place, and time.      UC Treatments / Results  Labs (all labs ordered are listed, but only abnormal results are displayed) Labs Reviewed - No data to display  EKG   Radiology No results found.  Procedures Procedures (including critical care time)  Medications Ordered in UC Medications - No data to display  Initial Impression / Assessment and Plan / UC Course  I have reviewed the triage vital signs and the nursing notes.  Pertinent labs & imaging results that were available during my care of the patient were reviewed by me and considered in my medical decision making (see chart for details).  With positive  covid test, symptoms starting yesterday, candidate for paxlovid Discussed use of medication BID for the next 5 days Return precautions discussed. Patient agrees to plan  Final Clinical Impressions(s) / UC Diagnoses   Final diagnoses:  Lab test positive for detection of COVID-19 virus  Viral illness     Discharge Instructions      Please take the anti-virals as prescribed for the next 5 days.     ED Prescriptions     Medication Sig Dispense Auth. Provider   nirmatrelvir/ritonavir EUA (PAXLOVID) 20 x 150 MG & 10 x '100MG'$  TABS Take 3 tablets by mouth 2 (two) times daily for 5 days  (2 tabs of nirmatrelvir (150 mg) and 1 tab of ritonavir (100 mg)) 30 tablet Fraida Veldman, Wells Guiles, PA-C      PDMP not reviewed this encounter.   [place], [place] 04/20/22 1432

## 2022-04-20 NOTE — Discharge Instructions (Addendum)
Please take the anti-virals as prescribed for the next 5 days.

## 2022-04-20 NOTE — Telephone Encounter (Signed)
FYI.. called pt and she is f/u with urgent care today for medication. Let her know that if she ends up not being able to see them to let us know and we can schedule VV.

## 2022-04-20 NOTE — ED Triage Notes (Signed)
Pt states seen here yesterday, tested positive for COVID. States received a call from health dept to come back for f/u.

## 2022-04-21 ENCOUNTER — Telehealth: Payer: Self-pay | Admitting: Family Medicine

## 2022-04-21 NOTE — Telephone Encounter (Signed)
  Patient called for NEW PRESCRIPTION:  Call Back Number: [phone]  Date of Last Office Visit: 03/15/22  Date of Next Office Visit: 05/24/22  Medication(s) to be Refilled: COUGH MEDICATION (COVID+)  Preferred Pharmacy: [facility]

## 2022-04-21 NOTE — Telephone Encounter (Signed)
Called pt and relayed recommendations. Pt verbalized understanding and states she will try those

## 2022-04-21 NOTE — Telephone Encounter (Signed)
Pt is requesting cough medication since she tested positive for covid. She did not get any at urgent care yesterday.

## 2022-04-21 NOTE — Telephone Encounter (Signed)
Pt checking status of cough medicine prescription.

## 2022-04-25 ENCOUNTER — Ambulatory Visit (INDEPENDENT_AMBULATORY_CARE_PROVIDER_SITE_OTHER): Payer: 59 | Admitting: Family Medicine

## 2022-04-25 ENCOUNTER — Encounter: Payer: Self-pay | Admitting: Family Medicine

## 2022-04-25 VITALS — BP 138/98 | HR 85 | Temp 98.0°F | Ht <= 58 in | Wt 135.0 lb

## 2022-04-25 DIAGNOSIS — R519 Headache, unspecified: Secondary | ICD-10-CM | POA: Diagnosis not present

## 2022-04-25 DIAGNOSIS — R051 Acute cough: Secondary | ICD-10-CM | POA: Diagnosis not present

## 2022-04-25 DIAGNOSIS — R6883 Chills (without fever): Secondary | ICD-10-CM

## 2022-04-25 DIAGNOSIS — U071 COVID-19: Secondary | ICD-10-CM | POA: Diagnosis not present

## 2022-04-25 NOTE — Patient Instructions (Addendum)
The altered taste in your mouth and the diarrhea are most likely related to Paxlovid and should resolve when you complete the medication.  Make sure you are staying well-hydrated.  Eat a healthy diet.  Rest.  You may continue taking Tylenol and Robitussin as needed.  I recommend that you go back to work on Monday, 05/01/2022 since you are still symptomatic.

## 2022-04-25 NOTE — Progress Notes (Signed)
Subjective:  [name] is a 61 y.o. female who presents for chills, hot flashes, cough and headache. Still feels achy all over. Diarrhea started today. Altered taste in her mouth.  Taking Paxlovid and has one more dose this evening to take.  Taking Robitussin.   Diagnosed with Covid infection in the ED on 04/20/2022.  Symptoms started on 04/19/2022.   Denies dizziness, chest pain, shortness of breath, abdominal pain, N/V.   No other aggravating or relieving factors.  No other c/o.  ROS as in subjective.   Objective: Vitals:   04/25/22 1106  BP: (!) 138/98  Pulse: 85  Temp: 98 F (36.7 C)  SpO2: 97%    General appearance: Alert, WD/WN, no distress, mildly ill appearing    Respirations unlabored.                                    Assessment: COVID-19 virus infection  Chills  Acute cough  Acute nonintractable headache, unspecified headache type   Plan: Complete Paxlovid.  Suggested symptomatic OTC remedies. Stay hydrated. Diarrhea and altered taste should resolve after stopping Paxlovid.  Out of work for the full 10 days of quarantine since she is still symptomatic. Work note provided.

## 2022-05-03 ENCOUNTER — Telehealth: Payer: Self-pay | Admitting: Family Medicine

## 2022-05-03 ENCOUNTER — Ambulatory Visit: Payer: 59

## 2022-05-03 NOTE — Telephone Encounter (Signed)
Appt scheduled for tomorrow, 11/9

## 2022-05-03 NOTE — Telephone Encounter (Signed)
Patient called in and said that she tested positive for covid at [facility] on 04/20/22. Patient is still having sore throat, congestion, nausea and other symptoms. She wanted to know what she should do. Call back is [phone]

## 2022-05-03 NOTE — Telephone Encounter (Signed)
Pt tested positive for COVID 10/26 and saw you for continued symptoms on 10/31. Pt is calling in stating she is still experiencing symptoms such as a sore throat, nausea and congestion and wants to know what else can be done for her symptoms ?

## 2022-05-04 ENCOUNTER — Ambulatory Visit (INDEPENDENT_AMBULATORY_CARE_PROVIDER_SITE_OTHER): Payer: 59 | Admitting: Family Medicine

## 2022-05-04 ENCOUNTER — Encounter: Payer: Self-pay | Admitting: Family Medicine

## 2022-05-04 VITALS — BP 138/80 | HR 83 | Temp 99.7°F | Ht <= 58 in | Wt 141.0 lb

## 2022-05-04 DIAGNOSIS — E7849 Other hyperlipidemia: Secondary | ICD-10-CM

## 2022-05-04 DIAGNOSIS — R052 Subacute cough: Secondary | ICD-10-CM | POA: Diagnosis not present

## 2022-05-04 DIAGNOSIS — G9339 Other post infection and related fatigue syndromes: Secondary | ICD-10-CM | POA: Diagnosis not present

## 2022-05-04 DIAGNOSIS — R748 Abnormal levels of other serum enzymes: Secondary | ICD-10-CM | POA: Diagnosis not present

## 2022-05-04 NOTE — Patient Instructions (Signed)
Continue staying hydrated and eating a nutritious diet.  Gradually improve your physical activity as tolerated.  Your symptoms should continue to improve.  Please schedule a lab visit to return tomorrow fasting.  I will check your cholesterol and liver function and forward these to your gastroenterologist.  Please schedule a follow-up with Dr. Henrene Pastor as recommended.

## 2022-05-04 NOTE — Progress Notes (Signed)
Subjective:     Patient ID: [name], female    DOB: 01/11/1961, 61 y.o.   MRN: [id number]  Chief Complaint  Patient presents with   Fatigue    Legs are feeling very fatigued and also having difficulties breathing. Pt states that she has to stop and catch her breath     HPI Patient is in today for fatigue, cough and intermittent shortness of breath with activity since having Covid infection. States she is back to work which requires a lot of standing and walking and her legs feel tired. Symptoms are all improving.   Overdue for recheck of LFTs per Dr. Henrene Pastor.   HL- taking a statin but cannot recall which one she is currently taking.   Denies fever, chills, dizziness, chest pain, palpitations, abdominal pain, N/V/D, urinary symptoms, LE edema.      Health Maintenance Due  Topic Date Due   Medicare Annual Wellness (AWV)  Never done   COVID-19 Vaccine (4 - Moderna series) 07/05/2020   MAMMOGRAM  10/22/2021    Past Medical History:  Diagnosis Date   Anemia    Arthritis    "neck, hips" (04/25/2018)   Claustrophobia    Daily headache    GERD (gastroesophageal reflux disease)    Hiatal hernia    Hyperlipidemia    "hx" (04/25/2018)   Hypertension    Plantar fasciitis    hx - left foot   Stroke ([place]) 05/2014   "mini stroke" (04/25/2018)    Past Surgical History:  Procedure Laterality Date   ABDOMINAL HYSTERECTOMY  12/09   Secondary to fibroids   ARTERY BIOPSY Right 04/10/2018   Procedure: BIOPSY TEMPORAL ARTERY;  Surgeon: Rosetta Posner, MD;  Location: [place];  Service: Vascular;  Laterality: Right;   [place]; 1988   COLONOSCOPY     FOOT SURGERY Left 03/2017   Bone Spurs Removed   LUMBAR LAMINECTOMY/DECOMPRESSION MICRODISCECTOMY N/A 04/13/2021   Procedure: LUMBAR FOUR- LUMBAR FIVE AND LUMBAR FIVE-SACRAL ONE  DECOMPRESSION;  Surgeon: Marybelle Killings, MD;  Location: [place];  Service: Orthopedics;  Laterality: N/A;   RADIOLOGY WITH ANESTHESIA N/A  04/26/2018   Procedure: MRI WITH ANESTHESIA;  Surgeon: Radiologist, Medication, MD;  Location: [place];  Service: Radiology;  Laterality: N/A;   RADIOLOGY WITH ANESTHESIA N/A 08/24/2019   Procedure: MRI WITH ANESTHESIA CERVICAL SPINE;  Surgeon: Radiologist, Medication, MD;  Location: [place];  Service: Radiology;  Laterality: N/A;   RADIOLOGY WITH ANESTHESIA N/A 10/02/2019   Procedure: MRI WITH ANESTHESIA CERVICAL WITHOUT CONTRAST;  Surgeon: Radiologist, Medication, MD;  Location: [place];  Service: Radiology;  Laterality: N/A;   TUBAL LIGATION  1988    Family History  Problem Relation Age of Onset   Cancer Mother 30       pancreatic cancer   Pancreatic cancer Mother    Heart disease Mother    Cancer Father 52       throat cancer   Esophageal cancer Father    Cancer Maternal Aunt 30       breast cancer   Cancer Other        Died  from Colon cancer in 60's   Colon cancer Maternal Uncle    Colon cancer Paternal Uncle    Heart disease Sister    Rectal cancer Neg Hx    Stomach cancer Neg Hx     Social History   Socioeconomic History   Marital status: Divorced    Spouse name: Not  on file   Number of children: Not on file   Years of education: Not on file   Highest education level: Not on file  Occupational History   Occupation: unemployed    Comment: used to work in a [employer]  Tobacco Use   Smoking status: Some Days    Packs/day: 0.25    Years: 38.00    Total pack years: 9.50    Types: Cigarettes   Smokeless tobacco: Never   Tobacco comments:    0-4 cigs/day   Vaping Use   Vaping Use: Never used  Substance and Sexual Activity   Alcohol use: Yes    Alcohol/week: 4.0 standard drinks of alcohol    Types: 4 Cans of beer per week   Drug use: Not Currently   Sexual activity: Not Currently    Birth control/protection: Surgical    Comment: Hysterectomy  Other Topics Concern   Not on file  Social History Narrative   10 th grade education.   Social Determinants of Health    Financial Resource Strain: Not on file  Food Insecurity: Not on file  Transportation Needs: No Transportation Needs (08/27/2019)   PRAPARE - Hydrologist (Medical): No    Lack of Transportation (Non-Medical): No  Physical Activity: Not on file  Stress: Not on file  Social Connections: Not on file  Intimate Partner Violence: Not on file    Outpatient Medications Prior to Visit  Medication Sig Dispense Refill   amLODipine (NORVASC) 5 MG tablet Take 1 tablet (5 mg total) by mouth daily. 90 tablet 3   diclofenac Sodium (VOLTAREN) 1 % GEL Apply 2 g topically 4 (four) times daily. 200 g 1   fluticasone (FLONASE) 50 MCG/ACT nasal spray Place 2 sprays into both nostrils daily. 16 g 1   folic acid (FOLVITE) 1 MG tablet Take 1 tablet (1 mg total) by mouth daily. 30 tablet 2   lipase/protease/amylase (CREON) 36000 UNITS CPEP capsule Take 3 capsules (108,000 Units total) by mouth 3 (three) times daily with meals AND 2 capsules (72,000 Units total) with snacks. 330 capsule 6   lisinopril (ZESTRIL) 30 MG tablet Take 1 tablet (30 mg total) by mouth daily. 90 tablet 3   pantoprazole (PROTONIX) 40 MG tablet Take 1 tablet (40 mg total) by mouth 2 (two) times daily. Take 30 minutes before breakfast. 60 tablet 1   No facility-administered medications prior to visit.    Allergies  Allergen Reactions   Aspirin Hives   Hydromorphone Hcl Hives and Nausea And Vomiting   Latex Rash    ROS     Objective:    Physical Exam Constitutional:      General: She is not in acute distress.    Appearance: She is not ill-appearing.  Eyes:     Extraocular Movements: Extraocular movements intact.     Conjunctiva/sclera: Conjunctivae normal.     Pupils: Pupils are equal, round, and reactive to light.  Cardiovascular:     Rate and Rhythm: Normal rate and regular rhythm.  Pulmonary:     Effort: Pulmonary effort is normal.     Breath sounds: Normal breath sounds.  Musculoskeletal:         General: Normal range of motion.     Cervical back: Normal range of motion and neck supple.     Right lower leg: No edema.     Left lower leg: No edema.  Skin:    General: Skin is warm and dry.  Neurological:     General: No focal deficit present.     Mental Status: She is alert and oriented to person, place, and time.  Psychiatric:        Mood and Affect: Mood normal.        Behavior: Behavior normal.        Thought Content: Thought content normal.     BP 138/80 (BP Location: Left Arm, Patient Position: Sitting, Cuff Size: Normal)   Pulse 83   Temp 99.7 F (37.6 C) (Oral)   Ht '4\' 10"'$  (1.473 m)   Wt 141 lb (64 kg)   LMP  (LMP Unknown)   SpO2 97%   BMI 29.47 kg/m  Wt Readings from Last 3 Encounters:  05/04/22 141 lb (64 kg)  04/25/22 135 lb (61.2 kg)  03/28/22 135 lb 3 oz (61.3 kg)       Assessment & Plan:   Problem List Items Addressed This Visit       Other   Hyperlipemia (Chronic)   Relevant Orders   Lipid panel   Elevated liver enzymes   Relevant Orders   Comprehensive metabolic panel   Other Visit Diagnoses     Subacute cough    -  Primary   Other post infection and related fatigue syndromes          She is improving but lingering Covid symptoms are fatigue, cough and she is deconditioned.  Overdue for follow up with GI for labs to recheck LFTs. She will return for labs tomorrow along with fasting lipids. Continue statin therapy.   I am having [name] maintain her amLODipine, lipase/protease/amylase, lisinopril, fluticasone, folic acid, pantoprazole, and diclofenac Sodium.  No orders of the defined types were placed in this encounter.

## 2022-05-05 ENCOUNTER — Other Ambulatory Visit: Payer: Self-pay

## 2022-05-05 ENCOUNTER — Other Ambulatory Visit: Payer: 59

## 2022-05-05 ENCOUNTER — Telehealth: Payer: Self-pay | Admitting: Family Medicine

## 2022-05-05 ENCOUNTER — Other Ambulatory Visit (HOSPITAL_COMMUNITY): Payer: Self-pay

## 2022-05-05 DIAGNOSIS — E7849 Other hyperlipidemia: Secondary | ICD-10-CM

## 2022-05-05 MED ORDER — ROSUVASTATIN CALCIUM 20 MG PO TABS
20.0000 mg | ORAL_TABLET | Freq: Every day | ORAL | 1 refills | Status: DC
  Filled 2022-05-05: qty 30, 30d supply, fill #0

## 2022-05-05 NOTE — Telephone Encounter (Signed)
ATC pt to inform rx was sent but line was busy

## 2022-05-05 NOTE — Telephone Encounter (Signed)
Would you like pt to continue on rosuvastatin 20 mg?

## 2022-05-05 NOTE — Telephone Encounter (Signed)
Patient called and wanted Vickie to know the name of the cholesterol medication she was taking before. The name of the medication was Rosuvastatin '20mg'$  1 daily. The patient also stated that if Vickie wants her to start back on this medicine, if she could send it to her [place]  .

## 2022-05-05 NOTE — Progress Notes (Signed)
Rx sent 

## 2022-05-08 ENCOUNTER — Other Ambulatory Visit: Payer: Self-pay | Admitting: Family Medicine

## 2022-05-08 ENCOUNTER — Other Ambulatory Visit (INDEPENDENT_AMBULATORY_CARE_PROVIDER_SITE_OTHER): Payer: 59

## 2022-05-08 ENCOUNTER — Other Ambulatory Visit (HOSPITAL_COMMUNITY): Payer: Self-pay

## 2022-05-08 DIAGNOSIS — E876 Hypokalemia: Secondary | ICD-10-CM

## 2022-05-08 DIAGNOSIS — E7849 Other hyperlipidemia: Secondary | ICD-10-CM

## 2022-05-08 DIAGNOSIS — R748 Abnormal levels of other serum enzymes: Secondary | ICD-10-CM

## 2022-05-08 LAB — COMPREHENSIVE METABOLIC PANEL
ALT: 34 U/L (ref 0–35)
AST: 23 U/L (ref 0–37)
Albumin: 4.1 g/dL (ref 3.5–5.2)
Alkaline Phosphatase: 66 U/L (ref 39–117)
BUN: 14 mg/dL (ref 6–23)
CO2: 26 mEq/L (ref 19–32)
Calcium: 8.8 mg/dL (ref 8.4–10.5)
Chloride: 106 mEq/L (ref 96–112)
Creatinine, Ser: 0.53 mg/dL (ref 0.40–1.20)
GFR: 100.37 mL/min (ref >60.00)
Glucose, Bld: 95 mg/dL (ref 70–99)
Potassium: 3.1 mEq/L — ABNORMAL LOW (ref 3.5–5.1)
Sodium: 143 mEq/L (ref 135–145)
Total Bilirubin: 0.3 mg/dL (ref 0.2–1.2)
Total Protein: 7 g/dL (ref 6.0–8.3)

## 2022-05-08 LAB — LIPID PANEL
Cholesterol: 201 mg/dL — ABNORMAL HIGH (ref 0–200)
HDL: 47.3 mg/dL (ref >39.00)
NonHDL: 154.07
Total CHOL/HDL Ratio: 4
Triglycerides: 231 mg/dL — ABNORMAL HIGH (ref 0.0–149.0)
VLDL: 46.2 mg/dL — ABNORMAL HIGH (ref 0.0–40.0)

## 2022-05-08 LAB — LDL CHOLESTEROL, DIRECT: Direct LDL: 107 mg/dL

## 2022-05-08 MED ORDER — POTASSIUM CHLORIDE CRYS ER 20 MEQ PO TBCR
20.0000 meq | EXTENDED_RELEASE_TABLET | Freq: Two times a day (BID) | ORAL | 0 refills | Status: DC
  Filled 2022-05-08: qty 6, 3d supply, fill #0

## 2022-05-08 NOTE — Progress Notes (Signed)
Her potassium is low. I will send in a supplement for her to take for the the next 3 days. I sent her a message as well. Take the rosuvastatin daily for the next 6 weeks and follow up with me fasting.

## 2022-05-08 NOTE — Telephone Encounter (Signed)
Spoke w pt and informed her medication was sent in and to please take daily. Pt verbalized understanding

## 2022-05-09 ENCOUNTER — Other Ambulatory Visit (HOSPITAL_COMMUNITY): Payer: Self-pay

## 2022-05-13 ENCOUNTER — Ambulatory Visit (HOSPITAL_COMMUNITY)
Admission: EM | Admit: 2022-05-13 | Discharge: 2022-05-13 | Disposition: A | Discharge status: Home / Self Care | Payer: 59 | Attending: Family Medicine | Admitting: Family Medicine

## 2022-05-13 ENCOUNTER — Encounter (HOSPITAL_COMMUNITY): Payer: Self-pay

## 2022-05-13 DIAGNOSIS — M25512 Pain in left shoulder: Secondary | ICD-10-CM

## 2022-05-13 MED ORDER — MELOXICAM 15 MG PO TABS: 15.0000 mg | ORAL_TABLET | Freq: Every day | ORAL | 0 refills | Status: DC

## 2022-05-13 MED ORDER — KETOROLAC TROMETHAMINE 30 MG/ML IJ SOLN
INTRAMUSCULAR | Status: AC
  Filled 2022-05-13: qty 1

## 2022-05-13 MED ORDER — KETOROLAC TROMETHAMINE 30 MG/ML IJ SOLN
30.0000 mg | Freq: Once | INTRAMUSCULAR | Status: AC
  Administered 2022-05-13: 30 mg via INTRAMUSCULAR

## 2022-05-13 NOTE — ED Triage Notes (Signed)
Pt is here for left shoulder pain x1day

## 2022-05-15 NOTE — ED Provider Notes (Signed)
[facility]   [id number] 05/13/22 Arrival Time: 7353  ASSESSMENT & PLAN:  1. Acute pain of left shoulder    Pain and exam consistent with MSK etiology. Discussed. Work note provided. Activities as tolerated.  Begin: Discharge Medication List as of 05/13/2022  1:22 PM     START taking these medications   Details  meloxicam (MOBIC) 15 MG tablet Take 1 tablet (15 mg total) by mouth daily., Starting Sat 05/13/2022, Print       Recommend:  Follow-up Information     [place].   Why: If worsening or failing to improve as anticipated. Contact information: [address] [place] [phone]                Reviewed expectations re: course of current medical issues. Questions answered. Outlined signs and symptoms indicating need for more acute intervention. Patient verbalized understanding. After Visit Summary given.  SUBJECTIVE: History from: patient. [name] is a 61 y.o. female who reports Pt is here for left shoulder pain x1day. No trauma. Noted after lifting heavy item yesterday. Mainly anterior shoulder; worse with lifting art at shoulder. No extremity sensation changes or weakness. No h/o similar. No tx PTA.  Past Surgical History:  Procedure Laterality Date   ABDOMINAL HYSTERECTOMY  12/09   Secondary to fibroids   ARTERY BIOPSY Right 04/10/2018   Procedure: BIOPSY TEMPORAL ARTERY;  Surgeon: Rosetta Posner, MD;  Location: [facility];  Service: Vascular;  Laterality: Right;   [place]; 1988   COLONOSCOPY     FOOT SURGERY Left 03/2017   Bone Spurs Removed   LUMBAR LAMINECTOMY/DECOMPRESSION MICRODISCECTOMY N/A 04/13/2021   Procedure: LUMBAR FOUR- LUMBAR FIVE AND LUMBAR FIVE-SACRAL ONE  DECOMPRESSION;  Surgeon: Marybelle Killings, MD;  Location: [facility];  Service: Orthopedics;  Laterality: N/A;   RADIOLOGY WITH ANESTHESIA N/A 04/26/2018   Procedure: MRI WITH ANESTHESIA;   Surgeon: Radiologist, Medication, MD;  Location: [facility];  Service: Radiology;  Laterality: N/A;   RADIOLOGY WITH ANESTHESIA N/A 08/24/2019   Procedure: MRI WITH ANESTHESIA CERVICAL SPINE;  Surgeon: Radiologist, Medication, MD;  Location: [facility];  Service: Radiology;  Laterality: N/A;   RADIOLOGY WITH ANESTHESIA N/A 10/02/2019   Procedure: MRI WITH ANESTHESIA CERVICAL WITHOUT CONTRAST;  Surgeon: Radiologist, Medication, MD;  Location: [facility];  Service: Radiology;  Laterality: N/A;   TUBAL LIGATION  1988      OBJECTIVE:  Vitals:   05/13/22 1243  BP: (!) 147/99  Pulse: 84  Resp: 12  Temp: 98 F (36.7 C)  TempSrc: Oral  SpO2: 98%    General appearance: alert; no distress HEENT: [place]; AT Neck: supple with FROM Resp: unlabored respirations Extremities: LUE: warm with well perfused appearance; fairly well localized moderate tenderness over left anterior shoulder at biceps insertion; no bunching of muscle; without gross deformities; swelling: none; bruising: none; shoulder ROM: normal, with discomfort CV: brisk extremity capillary refill of LUE; 2+ radial pulse of LUE. Skin: warm and dry; no visible rashes Neurologic: gait normal; normal sensation and strength of LUE Psychological: alert and cooperative; normal mood and affect    Allergies  Allergen Reactions   Aspirin Hives   Hydromorphone Hcl Hives and Nausea And Vomiting   Latex Rash    Past Medical History:  Diagnosis Date   Anemia    Arthritis    "neck, hips" (04/25/2018)   Claustrophobia    Daily headache    GERD (gastroesophageal  reflux disease)    Hiatal hernia    Hyperlipidemia    "hx" (04/25/2018)   Hypertension    Plantar fasciitis    hx - left foot   Stroke ([place]) 05/2014   "mini stroke" (04/25/2018)   Social History   Socioeconomic History   Marital status: Divorced    Spouse name: Not on file   Number of children: Not on file   Years of education: Not on file   Highest education level: Not on file   Occupational History   Occupation: unemployed    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   Social Determinants of Health   Financial Resource Strain: Not on file  Food Insecurity: Not on file  Transportation Needs: No Transportation Needs (08/27/2019)   PRAPARE - Hydrologist (Medical): No    Lack of Transportation (Non-Medical): No  Physical Activity: Not on file  Stress: Not on file  Social Connections: Not on file   Family History  Problem Relation Age of Onset   Cancer Mother 53       pancreatic cancer   Pancreatic cancer Mother    Heart disease Mother    Cancer Father 37       throat cancer   Esophageal cancer Father    Cancer Maternal Aunt 74       breast cancer   Cancer Other        Died  from Colon cancer in 60's   Colon cancer Maternal Uncle    Colon cancer Paternal Uncle    Heart disease Sister    Rectal cancer Neg Hx    Stomach cancer Neg Hx    Past Surgical History:  Procedure Laterality Date   ABDOMINAL HYSTERECTOMY  12/09   Secondary to fibroids   ARTERY BIOPSY Right 04/10/2018   Procedure: BIOPSY TEMPORAL ARTERY;  Surgeon: Rosetta Posner, MD;  [facility];  Service: Vascular;  Laterality: Right;   [place]; 1988   COLONOSCOPY     FOOT SURGERY Left 03/2017   Bone Spurs Removed   LUMBAR LAMINECTOMY/DECOMPRESSION MICRODISCECTOMY N/A  04/13/2021   Procedure: LUMBAR FOUR- LUMBAR FIVE AND LUMBAR FIVE-SACRAL ONE  DECOMPRESSION;  Surgeon: Marybelle Killings, MD;  Location: [facility];  Service: Orthopedics;  Laterality: N/A;   RADIOLOGY WITH ANESTHESIA N/A 04/26/2018   Procedure: MRI WITH ANESTHESIA;  Surgeon: Radiologist, Medication, MD;  Location: [facility];  Service: Radiology;  Laterality: N/A;   RADIOLOGY WITH ANESTHESIA N/A 08/24/2019   Procedure: MRI WITH ANESTHESIA CERVICAL SPINE;  Surgeon: Radiologist, Medication, MD;  Location: [facility];  Service: Radiology;  Laterality: N/A;   RADIOLOGY WITH ANESTHESIA N/A 10/02/2019   Procedure: MRI WITH ANESTHESIA CERVICAL WITHOUT CONTRAST;  Surgeon: Radiologist,  Medication, MD;  Location: [facility];  Service: Radiology;  Laterality: N/A;   TUBAL LIGATION  1988       Vanessa Kick, MD 05/15/22 [phone]

## 2022-05-16 ENCOUNTER — Other Ambulatory Visit (HOSPITAL_COMMUNITY): Payer: Self-pay

## 2022-05-16 ENCOUNTER — Ambulatory Visit (INDEPENDENT_AMBULATORY_CARE_PROVIDER_SITE_OTHER): Payer: 59 | Admitting: Internal Medicine

## 2022-05-16 ENCOUNTER — Telehealth: Payer: Self-pay | Admitting: Family Medicine

## 2022-05-16 ENCOUNTER — Encounter: Payer: Self-pay | Admitting: Internal Medicine

## 2022-05-16 VITALS — BP 132/84 | Temp 86.0°F | Ht <= 58 in | Wt 135.0 lb

## 2022-05-16 DIAGNOSIS — T464X5A Adverse effect of angiotensin-converting-enzyme inhibitors, initial encounter: Secondary | ICD-10-CM

## 2022-05-16 DIAGNOSIS — R058 Other specified cough: Secondary | ICD-10-CM

## 2022-05-16 DIAGNOSIS — U099 Post covid-19 condition, unspecified: Secondary | ICD-10-CM | POA: Diagnosis not present

## 2022-05-16 DIAGNOSIS — J209 Acute bronchitis, unspecified: Secondary | ICD-10-CM | POA: Diagnosis not present

## 2022-05-16 DIAGNOSIS — M791 Myalgia, unspecified site: Secondary | ICD-10-CM

## 2022-05-16 DIAGNOSIS — G9332 Myalgic encephalomyelitis/chronic fatigue syndrome: Secondary | ICD-10-CM | POA: Diagnosis not present

## 2022-05-16 MED ORDER — METHYLPREDNISOLONE 4 MG PO TBPK
ORAL_TABLET | ORAL | 0 refills | Status: DC
  Filled 2022-05-16: qty 21, 6d supply, fill #0

## 2022-05-16 MED ORDER — CEFDINIR 300 MG PO CAPS
300.0000 mg | ORAL_CAPSULE | Freq: Two times a day (BID) | ORAL | 0 refills | Status: DC
  Filled 2022-05-16: qty 4, 2d supply, fill #0
  Filled 2022-05-16: qty 16, 8d supply, fill #0

## 2022-05-16 MED ORDER — HYDROCODONE BIT-HOMATROP MBR 5-1.5 MG/5ML PO SOLN
5.0000 mL | ORAL | 0 refills | Status: DC | PRN
  Filled 2022-05-16: qty 210, 7d supply, fill #0

## 2022-05-16 MED ORDER — VITAMIN D3 50 MCG (2000 UT) PO CAPS
2000.0000 [IU] | ORAL_CAPSULE | Freq: Every day | ORAL | 3 refills | Status: DC
  Filled 2022-05-16: qty 100, 100d supply, fill #0

## 2022-05-16 NOTE — Assessment & Plan Note (Signed)
New Stop Lisinopril (cough) and Rosuvastatin (pain in the muscles)

## 2022-05-16 NOTE — Assessment & Plan Note (Signed)
Stop Lisinopril (cough)  Hycodan po prn Omnicef po

## 2022-05-16 NOTE — Telephone Encounter (Signed)
Pt called back and scheduled appt today. 05/16/22 @ 1:20 [place]

## 2022-05-16 NOTE — Telephone Encounter (Signed)
experiencing same symptoms as when pt caught covid. Pt says started last night having cold chills, shaking and trembling and headaches. Please call pt to advise [phone]

## 2022-05-16 NOTE — Progress Notes (Signed)
Subjective:  Patient ID: [name], female    DOB: Sep 22, 1960  Age: 60 y.o. MRN: [id number]  CC: No chief complaint on file.   HPI [name] presents for HA, chills, productive cough x 1 day Pt had COVID - Apr 19, 2022, never got better - weak legs, pain in the muscles, cough  Outpatient Medications Prior to Visit  Medication Sig Dispense Refill   amLODipine (NORVASC) 5 MG tablet Take 1 tablet (5 mg total) by mouth daily. 90 tablet 3   diclofenac Sodium (VOLTAREN) 1 % GEL Apply 2 g topically 4 (four) times daily. 200 g 1   fluticasone (FLONASE) 50 MCG/ACT nasal spray Place 2 sprays into both nostrils daily. 16 g 1   folic acid (FOLVITE) 1 MG tablet Take 1 tablet (1 mg total) by mouth daily. 30 tablet 2   lipase/protease/amylase (CREON) 36000 UNITS CPEP capsule Take 3 capsules (108,000 Units total) by mouth 3 (three) times daily with meals AND 2 capsules (72,000 Units total) with snacks. 330 capsule 6   meloxicam (MOBIC) 15 MG tablet Take 1 tablet (15 mg total) by mouth daily. 7 tablet 0   pantoprazole (PROTONIX) 40 MG tablet Take 1 tablet (40 mg total) by mouth 2 (two) times daily. Take 30 minutes before breakfast. 60 tablet 1   potassium chloride SA (KLOR-CON M) 20 MEQ tablet Take 1 tablet (20 mEq total) by mouth 2 (two) times daily. 6 tablet 0   lisinopril (ZESTRIL) 30 MG tablet Take 1 tablet (30 mg total) by mouth daily. 90 tablet 3   rosuvastatin (CRESTOR) 20 MG tablet Take 1 tablet (20 mg total) by mouth daily. 90 tablet 1   No facility-administered medications prior to visit.    ROS: Review of Systems  Constitutional:  Positive for chills, fatigue and fever. Negative for activity change, appetite change and unexpected weight change.  HENT:  Positive for congestion. Negative for mouth sores and sinus pressure.   Eyes:  Negative for visual disturbance.  Respiratory:  Positive for cough and wheezing. Negative for chest tightness.   Gastrointestinal:  Negative for  abdominal pain and nausea.  Genitourinary:  Negative for difficulty urinating, frequency and vaginal pain.  Musculoskeletal:  Positive for gait problem and myalgias. Negative for back pain.  Skin:  Negative for pallor and rash.  Neurological:  Negative for dizziness, tremors, weakness, numbness and headaches.  Psychiatric/Behavioral:  Negative for confusion and sleep disturbance.     Objective:  BP 132/84 (BP Location: Left Arm)   Temp (!) 86 F (30 C) (Oral)   Ht '4\' 10"'$  (1.473 m)   Wt 135 lb (61.2 kg)   LMP  (LMP Unknown)   SpO2 95%   BMI 28.22 kg/m   BP Readings from Last 3 Encounters:  05/16/22 132/84  05/13/22 (!) 147/99  05/04/22 138/80    Wt Readings from Last 3 Encounters:  05/16/22 135 lb (61.2 kg)  05/04/22 141 lb (64 kg)  04/25/22 135 lb (61.2 kg)    Physical Exam Constitutional:      General: She is not in acute distress.    Appearance: She is well-developed. She is ill-appearing. She is not toxic-appearing.  HENT:     Head: Normocephalic.     Right Ear: External ear normal.     Left Ear: External ear normal.     Nose: Nose normal.  Eyes:     General:        Right eye: No discharge.  Left eye: No discharge.     Conjunctiva/sclera: Conjunctivae normal.     Pupils: Pupils are equal, round, and reactive to light.  Neck:     Thyroid: No thyromegaly.     Vascular: No JVD.     Trachea: No tracheal deviation.  Cardiovascular:     Rate and Rhythm: Normal rate and regular rhythm.     Heart sounds: Normal heart sounds.  Pulmonary:     Effort: No respiratory distress.     Breath sounds: No stridor. No wheezing.  Abdominal:     General: Bowel sounds are normal. There is no distension.     Palpations: Abdomen is soft. There is no mass.     Tenderness: There is no abdominal tenderness. There is no guarding or rebound.  Musculoskeletal:        General: Tenderness present.     Cervical back: Normal range of motion and neck supple. No rigidity.   Lymphadenopathy:     Cervical: No cervical adenopathy.  Skin:    Findings: No erythema or rash.  Neurological:     Cranial Nerves: No cranial nerve deficit.     Motor: No abnormal muscle tone.     Coordination: Coordination normal.     Deep Tendon Reflexes: Reflexes normal.  Psychiatric:        Behavior: Behavior normal.        Thought Content: Thought content normal.        Judgment: Judgment normal.   Painful muscles  Lab Results  Component Value Date   WBC 5.4 03/28/2022   HGB 10.6 (L) 03/28/2022   HCT 33.2 (L) 03/28/2022   PLT 246 03/28/2022   GLUCOSE 95 05/08/2022   CHOL 201 (H) 05/08/2022   TRIG 231.0 (H) 05/08/2022   HDL 47.30 05/08/2022   LDLDIRECT 107.0 05/08/2022   LDLCALC 43 10/20/2021   ALT 34 05/08/2022   AST 23 05/08/2022   NA 143 05/08/2022   K 3.1 (L) 05/08/2022   CL 106 05/08/2022   CREATININE 0.53 05/08/2022   BUN 14 05/08/2022   CO2 26 05/08/2022   TSH 0.97 03/15/2022   INR 0.96 06/12/2014   HGBA1C 5.9 10/20/2021    No results found.  Assessment & Plan:   Problem List Items Addressed This Visit     Post-COVID chronic fatigue - Primary    New Stop Lisinopril (cough) and Rosuvastatin (pain in the muscles)      Cough due to ACE inhibitor    Stop Lisinopril (cough)       Myalgia    Stop Rosuvastatin (pain in the muscles)      Acute bronchitis    Stop Lisinopril (cough)  Hycodan po prn Omnicef po          Meds ordered this encounter  Medications   Cholecalciferol (VITAMIN D3) 50 MCG (2000 UT) capsule    Sig: Take 1 capsule (2,000 Units total) by mouth daily.    Dispense:  100 capsule    Refill:  3   HYDROcodone bit-homatropine (HYCODAN) 5-1.5 MG/5ML syrup    Sig: Take 5 mLs by mouth every 4 (four) hours as needed for cough.    Dispense:  240 mL    Refill:  0   methylPREDNISolone (MEDROL DOSEPAK) 4 MG TBPK tablet    Sig: As directed    Dispense:  21 tablet    Refill:  0   cefdinir (OMNICEF) 300 MG capsule    Sig: Take  1 capsule (300 mg total)  by mouth 2 (two) times daily.    Dispense:  20 capsule    Refill:  0      Follow-up: No follow-ups on file.  Walker Kehr, MD

## 2022-05-16 NOTE — Assessment & Plan Note (Signed)
Stop Lisinopril (cough)

## 2022-05-16 NOTE — Assessment & Plan Note (Signed)
Stop Rosuvastatin (pain in the muscles)

## 2022-05-16 NOTE — Patient Instructions (Addendum)
Stop Lisinopril (cough) and Rosuvastatin (pain in the muscles)

## 2022-05-24 ENCOUNTER — Ambulatory Visit: Payer: 59 | Admitting: Family Medicine

## 2022-05-30 ENCOUNTER — Ambulatory Visit: Payer: 59 | Admitting: Family Medicine

## 2022-06-05 ENCOUNTER — Other Ambulatory Visit: Payer: Self-pay | Admitting: Physician Assistant

## 2022-06-05 ENCOUNTER — Other Ambulatory Visit (HOSPITAL_COMMUNITY): Payer: Self-pay

## 2022-06-05 ENCOUNTER — Telehealth: Payer: Self-pay | Admitting: Family Medicine

## 2022-06-05 DIAGNOSIS — E538 Deficiency of other specified B group vitamins: Secondary | ICD-10-CM

## 2022-06-05 MED ORDER — FOLIC ACID 1 MG PO TABS
1.0000 mg | ORAL_TABLET | Freq: Every day | ORAL | 2 refills | Status: DC
  Filled 2022-06-05: qty 30, 30d supply, fill #0
  Filled 2022-07-03: qty 30, 30d supply, fill #1
  Filled 2022-08-01: qty 30, 30d supply, fill #2

## 2022-06-05 NOTE — Telephone Encounter (Signed)
Patient called requesting a callback from Vickie's nurse due to her having some questions about her medications. Best callback number is [phone].

## 2022-06-05 NOTE — Telephone Encounter (Signed)
Called pt and she is still dealing with extreme tiredness and fatigue since having COVID. She is wondering if Vickie recommends anything else she can take to possibly help her build up her energy so she doesn't feel as tired?

## 2022-06-06 ENCOUNTER — Ambulatory Visit: Payer: 59 | Admitting: Family Medicine

## 2022-06-06 NOTE — Telephone Encounter (Signed)
ATC, 2nd attempt, will try again later

## 2022-06-06 NOTE — Telephone Encounter (Signed)
ATC x3, unable to leave VM. If pt calls back please schedule OV

## 2022-06-06 NOTE — Telephone Encounter (Signed)
Scheduled pt for tomorrow, 06/07/22

## 2022-06-06 NOTE — Telephone Encounter (Signed)
ATC but line was busy, will try again later 

## 2022-06-07 ENCOUNTER — Encounter: Payer: Self-pay | Admitting: Family Medicine

## 2022-06-07 ENCOUNTER — Ambulatory Visit: Payer: Self-pay | Admitting: Family Medicine

## 2022-06-07 ENCOUNTER — Ambulatory Visit (INDEPENDENT_AMBULATORY_CARE_PROVIDER_SITE_OTHER): Payer: Self-pay

## 2022-06-07 ENCOUNTER — Other Ambulatory Visit: Payer: Self-pay | Admitting: Family Medicine

## 2022-06-07 VITALS — BP 134/86 | HR 88 | Temp 97.6°F | Ht <= 58 in | Wt 139.0 lb

## 2022-06-07 DIAGNOSIS — G9332 Myalgic encephalomyelitis/chronic fatigue syndrome: Secondary | ICD-10-CM

## 2022-06-07 DIAGNOSIS — E876 Hypokalemia: Secondary | ICD-10-CM

## 2022-06-07 DIAGNOSIS — D649 Anemia, unspecified: Secondary | ICD-10-CM

## 2022-06-07 DIAGNOSIS — R0602 Shortness of breath: Secondary | ICD-10-CM | POA: Diagnosis not present

## 2022-06-07 DIAGNOSIS — U099 Post covid-19 condition, unspecified: Secondary | ICD-10-CM

## 2022-06-07 DIAGNOSIS — R058 Other specified cough: Secondary | ICD-10-CM

## 2022-06-07 DIAGNOSIS — E559 Vitamin D deficiency, unspecified: Secondary | ICD-10-CM

## 2022-06-07 DIAGNOSIS — E538 Deficiency of other specified B group vitamins: Secondary | ICD-10-CM

## 2022-06-07 DIAGNOSIS — R059 Cough, unspecified: Secondary | ICD-10-CM | POA: Diagnosis not present

## 2022-06-07 DIAGNOSIS — R0609 Other forms of dyspnea: Secondary | ICD-10-CM

## 2022-06-07 DIAGNOSIS — M791 Myalgia, unspecified site: Secondary | ICD-10-CM

## 2022-06-07 DIAGNOSIS — R5383 Other fatigue: Secondary | ICD-10-CM | POA: Diagnosis not present

## 2022-06-07 LAB — TSH: TSH: 0.97 u[IU]/mL (ref 0.35–5.50)

## 2022-06-07 LAB — TROPONIN I (HIGH SENSITIVITY): High Sens Troponin I: 4 ng/L (ref 2–17)

## 2022-06-07 LAB — COMPREHENSIVE METABOLIC PANEL
ALT: 86 U/L — ABNORMAL HIGH (ref 0–35)
AST: 75 U/L — ABNORMAL HIGH (ref 0–37)
Albumin: 4.1 g/dL (ref 3.5–5.2)
Alkaline Phosphatase: 77 U/L (ref 39–117)
BUN: 17 mg/dL (ref 6–23)
CO2: 29 mEq/L (ref 19–32)
Calcium: 9.1 mg/dL (ref 8.4–10.5)
Chloride: 106 mEq/L (ref 96–112)
Creatinine, Ser: 0.6 mg/dL (ref 0.40–1.20)
GFR: 97.36 mL/min (ref >60.00)
Glucose, Bld: 103 mg/dL — ABNORMAL HIGH (ref 70–99)
Potassium: 3.3 mEq/L — ABNORMAL LOW (ref 3.5–5.1)
Sodium: 144 mEq/L (ref 135–145)
Total Bilirubin: 0.2 mg/dL (ref 0.2–1.2)
Total Protein: 6.8 g/dL (ref 6.0–8.3)

## 2022-06-07 LAB — CBC WITH DIFFERENTIAL/PLATELET
Basophils Absolute: 0 10*3/uL (ref 0.0–0.1)
Basophils Relative: 0.4 % (ref 0.0–3.0)
Eosinophils Absolute: 0.2 10*3/uL (ref 0.0–0.7)
Eosinophils Relative: 2.8 % (ref 0.0–5.0)
HCT: 32.1 % — ABNORMAL LOW (ref 36.0–46.0)
Hemoglobin: 10.3 g/dL — ABNORMAL LOW (ref 12.0–15.0)
Lymphocytes Relative: 44 % (ref 12.0–46.0)
Lymphs Abs: 2.8 10*3/uL (ref 0.7–4.0)
MCHC: 32.2 g/dL (ref 30.0–36.0)
MCV: 71 fl — ABNORMAL LOW (ref 78.0–100.0)
Monocytes Absolute: 0.6 10*3/uL (ref 0.1–1.0)
Monocytes Relative: 9 % (ref 3.0–12.0)
Neutro Abs: 2.8 10*3/uL (ref 1.4–7.7)
Neutrophils Relative %: 43.8 % (ref 43.0–77.0)
Platelets: 267 10*3/uL (ref 150.0–400.0)
RBC: 4.52 Mil/uL (ref 3.87–5.11)
RDW: 14.5 % (ref 11.5–15.5)
WBC: 6.4 10*3/uL (ref 4.0–10.5)

## 2022-06-07 LAB — VITAMIN D 25 HYDROXY (VIT D DEFICIENCY, FRACTURES): VITD: 24.5 ng/mL — ABNORMAL LOW (ref 30.00–100.00)

## 2022-06-07 LAB — FERRITIN: Ferritin: 59 ng/mL (ref 10.0–291.0)

## 2022-06-07 LAB — C-REACTIVE PROTEIN: CRP: <1 mg/dL (ref 0.5–20.0)

## 2022-06-07 LAB — BRAIN NATRIURETIC PEPTIDE: Pro B Natriuretic peptide (BNP): 23 pg/mL (ref 0.0–100.0)

## 2022-06-07 LAB — VITAMIN B12: Vitamin B-12: 866 pg/mL (ref 211–911)

## 2022-06-07 LAB — D-DIMER, QUANTITATIVE: D-Dimer, Quant: <0.19 mcg/mL FEU (ref <0.50)

## 2022-06-07 LAB — CK: Total CK: 56 U/L (ref 7–177)

## 2022-06-07 LAB — FOLATE: Folate: 13.5 ng/mL (ref >5.9)

## 2022-06-07 MED ORDER — VITAMIN D (ERGOCALCIFEROL) 1.25 MG (50000 UNIT) PO CAPS
50000.0000 [IU] | ORAL_CAPSULE | ORAL | 0 refills | Status: DC
  Filled 2022-06-07: qty 4, 28d supply, fill #0

## 2022-06-07 MED ORDER — POTASSIUM CHLORIDE CRYS ER 20 MEQ PO TBCR
20.0000 meq | EXTENDED_RELEASE_TABLET | Freq: Every day | ORAL | 0 refills | Status: DC
  Filled 2022-06-07: qty 5, 5d supply, fill #0

## 2022-06-07 NOTE — Progress Notes (Signed)
Subjective:     Patient ID: [name], female    DOB: 1961/03/25, 61 y.o.   MRN: [id number]  Chief Complaint  Patient presents with   Fatigue    Still experiencing fatigue since COVID    HPI Patient is in today for persistent fatigue, muscle weakness (thighs), and shortness of breath with activity.   New complaint of diarrhea since this morning. GI bug at her work.  4 episodes today. No blood or abdominal pain.   Positive Covid test 04/19/2022  Denies fever, chills, dizziness, chest pain, palpitations, shortness of breath, N/V, urinary symptoms, LE edema.   Hx of anemia, B12 def and folate def.   Sees hematologist and GI   Health Maintenance Due  Topic Date Due   MAMMOGRAM  10/22/2021    Past Medical History:  Diagnosis Date   Anemia    Arthritis    "neck, hips" (04/25/2018)   Claustrophobia    Daily headache    GERD (gastroesophageal reflux disease)    Hiatal hernia    Hyperlipidemia    "hx" (04/25/2018)   Hypertension    Plantar fasciitis    hx - left foot   Stroke ([place]) 05/2014   "mini stroke" (04/25/2018)    Past Surgical History:  Procedure Laterality Date   ABDOMINAL HYSTERECTOMY  12/09   Secondary to fibroids   ARTERY BIOPSY Right 04/10/2018   Procedure: BIOPSY TEMPORAL ARTERY;  Surgeon: Rosetta Posner, MD;  Location: [facility];  Service: Vascular;  Laterality: Right;   [place]; 1988   COLONOSCOPY     FOOT SURGERY Left 03/2017   Bone Spurs Removed   LUMBAR LAMINECTOMY/DECOMPRESSION MICRODISCECTOMY N/A 04/13/2021   Procedure: LUMBAR FOUR- LUMBAR FIVE AND LUMBAR FIVE-SACRAL ONE  DECOMPRESSION;  Surgeon: Marybelle Killings, MD;  Location: [facility];  Service: Orthopedics;  Laterality: N/A;   RADIOLOGY WITH ANESTHESIA N/A 04/26/2018   Procedure: MRI WITH ANESTHESIA;  Surgeon: Radiologist, Medication, MD;  Location: [facility];  Service: Radiology;  Laterality: N/A;   RADIOLOGY WITH ANESTHESIA N/A 08/24/2019   Procedure: MRI WITH ANESTHESIA CERVICAL  SPINE;  Surgeon: Radiologist, Medication, MD;  Location: [facility];  Service: Radiology;  Laterality: N/A;   RADIOLOGY WITH ANESTHESIA N/A 10/02/2019   Procedure: MRI WITH ANESTHESIA CERVICAL WITHOUT CONTRAST;  Surgeon: Radiologist, Medication, MD;  Location: [facility];  Service: Radiology;  Laterality: N/A;   TUBAL LIGATION  1988    Family History  Problem Relation Age of Onset   Cancer Mother 44       pancreatic cancer   Pancreatic cancer Mother    Heart disease Mother    Cancer Father 62       throat cancer   Esophageal cancer Father    Cancer Maternal Aunt 42       breast cancer   Cancer Other        Died  from Colon cancer in 60's   Colon cancer Maternal Uncle    Colon cancer Paternal Uncle    Heart disease Sister    Rectal cancer Neg Hx    Stomach cancer Neg Hx     Social History   Socioeconomic History   Marital status: Divorced    Spouse name: Not on file   Number of children: Not on file   Years of education: Not on file   Highest education level: Not on file  Occupational History   Occupation: unemployed    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   Social Determinants of Health   Financial Resource Strain: Not on file  Food Insecurity: Not on file  Transportation Needs: No Transportation Needs (08/27/2019)   PRAPARE - Hydrologist (Medical): No    Lack of Transportation  (Non-Medical): No  Physical Activity: Not on file  Stress: Not on file  Social Connections: Not on file  Intimate Partner Violence: Not on file    Outpatient Medications Prior to Visit  Medication Sig Dispense Refill   amLODipine (NORVASC) 5 MG tablet Take 1 tablet (5 mg total) by mouth daily. 90 tablet 3   cefdinir (OMNICEF) 300 MG capsule Take 1 capsule (300 mg total) by mouth 2 (two) times daily. 20 capsule 0   diclofenac Sodium (VOLTAREN) 1 % GEL Apply 2 g topically 4 (four) times daily. 200 g 1   fluticasone (FLONASE) 50 MCG/ACT nasal spray Place 2 sprays into both nostrils daily. 16 g 1   folic acid (FOLVITE) 1 MG tablet Take 1 tablet (1 mg total) by mouth daily. 30 tablet 2   HYDROcodone bit-homatropine (HYCODAN) 5-1.5 MG/5ML syrup Take 5 mLs by mouth every 4 (four) hours as needed for cough. 240 mL 0   lipase/protease/amylase (CREON) 36000 UNITS CPEP capsule Take 3 capsules (108,000 Units total) by mouth 3 (three) times daily with meals AND 2 capsules (72,000 Units total) with snacks. 330 capsule 6   meloxicam (MOBIC) 15 MG tablet Take 1 tablet (15 mg total) by mouth daily. 7 tablet 0   pantoprazole (PROTONIX) 40 MG tablet Take 1 tablet (40 mg total) by mouth 2 (two) times daily. Take 30 minutes before breakfast. 60 tablet 1   Cholecalciferol (VITAMIN D3) 50 MCG (2000 UT) capsule Take 1 capsule (2,000 Units total) by mouth daily. (Patient not taking: Reported on 06/07/2022) 100 capsule 3   methylPREDNISolone (MEDROL DOSEPAK) 4 MG TBPK tablet Take as directed 21 tablet 0   potassium chloride SA (KLOR-CON M) 20 MEQ tablet Take 1 tablet (20 mEq total) by mouth 2 (two) times daily. 6 tablet 0   No facility-administered medications prior to visit.    Allergies  Allergen Reactions   Aspirin Hives   Hydromorphone Hcl Hives and Nausea And Vomiting    Can take Norco   Crestor [Rosuvastatin]     myalgias   Lisinopril     cough   Latex Rash    ROS     Objective:    Physical  Exam Constitutional:      General: She is not in acute distress.    Appearance: She is not ill-appearing.  Cardiovascular:     Rate and Rhythm: Normal rate and regular rhythm.  Pulmonary:     Effort: Pulmonary effort is normal.  Breath sounds: Normal breath sounds.  Abdominal:     General: There is no distension.     Palpations: Abdomen is soft.     Tenderness: There is no abdominal tenderness.  Musculoskeletal:     Cervical back: Normal range of motion and neck supple. No tenderness.     Right lower leg: No edema.     Left lower leg: No edema.  Lymphadenopathy:     Cervical: No cervical adenopathy.  Skin:    General: Skin is warm and dry.  Neurological:     General: No focal deficit present.     Mental Status: She is alert and oriented to person, place, and time.     Motor: No weakness.     Gait: Gait normal.  Psychiatric:        Mood and Affect: Mood normal.        Behavior: Behavior normal.        Thought Content: Thought content normal.     BP 134/86 (BP Location: Left Arm, Patient Position: Sitting, Cuff Size: Large)   Pulse 88   Temp 97.6 F (36.4 C) (Temporal)   Ht '4\' 10"'$  (1.473 m)   Wt 139 lb (63 kg)   LMP  (LMP Unknown)   SpO2 99%   BMI 29.05 kg/m  Wt Readings from Last 3 Encounters:  06/07/22 139 lb (63 kg)  05/16/22 135 lb (61.2 kg)  05/04/22 141 lb (64 kg)       Assessment & Plan:   Problem List Items Addressed This Visit       Other   Chronic anemia   Relevant Orders   CBC with Differential/Platelet   Ferritin   Folic acid deficiency   Relevant Orders   Folate   Myalgia   Relevant Orders   CK (Creatine Kinase)   C-reactive protein   Post-COVID chronic fatigue - Primary   Relevant Orders   CBC with Differential/Platelet   Comprehensive metabolic panel   TSH   Vitamin B12   VITAMIN D 25 Hydroxy (Vit-D Deficiency, Fractures)   DG Chest 2 View   C-reactive protein   Vitamin B 12 deficiency   Relevant Orders   Vitamin B12    Other Visit Diagnoses     Hypokalemia       Relevant Orders   Comprehensive metabolic panel   Cough present for greater than 3 weeks       Relevant Orders   DG Chest 2 View   D-Dimer, Quantitative   Troponin I (High Sensitivity)   Brain natriuretic peptide   DOE (dyspnea on exertion)       Relevant Orders   D-Dimer, Quantitative   Troponin I (High Sensitivity)   Brain natriuretic peptide      She is non toxic. Lingering fatigue and cough since Covid 6 weeks ago. She has a hx of anemia, B12 and folate deficiencies as well as elevated LFTs.  No sign of ACS, PE, CHF but I will check D-dimer, Troponin, BNP, and a chest X ray to look for underlying etiology as well as other labs to rule out autoimmune or inflammation.  Follow up pending results   I have discontinued Kirstein P. Mertz's potassium chloride SA and methylPREDNISolone. I am also having her maintain her amLODipine, lipase/protease/amylase, fluticasone, pantoprazole, diclofenac Sodium, meloxicam, Vitamin D3, HYDROcodone bit-homatropine, cefdinir, and folic acid.  No orders of the defined types were placed in this encounter.

## 2022-06-08 ENCOUNTER — Other Ambulatory Visit: Payer: Self-pay

## 2022-06-08 ENCOUNTER — Other Ambulatory Visit (HOSPITAL_COMMUNITY): Payer: Self-pay

## 2022-06-09 ENCOUNTER — Other Ambulatory Visit (HOSPITAL_COMMUNITY): Payer: Self-pay

## 2022-06-15 ENCOUNTER — Other Ambulatory Visit: Payer: Self-pay

## 2022-06-15 ENCOUNTER — Ambulatory Visit (INDEPENDENT_AMBULATORY_CARE_PROVIDER_SITE_OTHER): Payer: 59

## 2022-06-15 ENCOUNTER — Other Ambulatory Visit (HOSPITAL_COMMUNITY): Payer: Self-pay

## 2022-06-15 ENCOUNTER — Ambulatory Visit (HOSPITAL_COMMUNITY)
Admission: EM | Admit: 2022-06-15 | Discharge: 2022-06-15 | Disposition: A | Discharge status: Home / Self Care | Payer: 59 | Attending: Physician Assistant | Admitting: Physician Assistant

## 2022-06-15 ENCOUNTER — Encounter (HOSPITAL_COMMUNITY): Payer: Self-pay | Admitting: Emergency Medicine

## 2022-06-15 DIAGNOSIS — M7989 Other specified soft tissue disorders: Secondary | ICD-10-CM | POA: Diagnosis not present

## 2022-06-15 DIAGNOSIS — M79672 Pain in left foot: Secondary | ICD-10-CM | POA: Diagnosis not present

## 2022-06-15 MED ORDER — PREDNISONE 10 MG (21) PO TBPK
ORAL_TABLET | ORAL | 0 refills | Status: DC
  Filled 2022-06-15: qty 21, 6d supply, fill #0

## 2022-06-15 NOTE — ED Provider Notes (Signed)
[place]    CSN: [id number] Arrival date & time: 06/15/22  0830      History   Chief Complaint Chief Complaint  Patient presents with   Foot Pain    HPI [name] is a 61 y.o. female.   Patient presents today with a weeklong history of recurrent and worsening left foot pain.  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  Does have a history of plantars fasciitis and has undergone surgery to address this as well as calcaneal spurs in 2017.  She is concerned that symptoms are recurring given pain distribution is similar to previous episodes of these conditions.  She has been using a cam boot which has not provided any relief.  She has tried high-dose ibuprofen as well as Tylenol without improvement.  She denies any numbness or paresthesias.    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   Cough due to ACE inhibitor 05/16/2022   Myalgia 05/16/2022   Acute bronchitis 05/16/2022   Hypomagnesemia 03/19/2022   Vitamin B 12 deficiency 03/19/2022   Low TSH level 03/19/2022   Right elbow pain 03/19/2022   Encounter for screening mammogram for malignant neoplasm of breast 03/19/2022   History of hypertension 12/13/2021   History of smoking 12/13/2021   Elevated blood pressure reading 12/13/2021   Contusion of right knee 12/13/2021   Fall 12/13/2021   Muscle cramps 12/04/2021   Elevated liver enzymes 12/04/2021   Paresthesias 12/04/2021   Elevated LFTs 12/04/2021   Chronic anemia 10/20/2021    Chronic foot pain 21/19/4174   Folic acid deficiency 02/06/4817   Chronic pain syndrome 09/07/2021   Status post lumbar spine surgery for decompression of spinal cord 05/24/2021   Dyspepsia 05/23/2021   History of gastroesophageal reflux (GERD) 05/23/2021   History of lumbar laminectomy for spinal cord decompression 04/26/2021   Lumbar stenosis 04/13/2021   Tension headache 12/22/2020   Low back pain 10/20/2020   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/28/2020   Chronic diarrhea 09/14/2017   History of stroke 06/12/2014   Cervical radiculitis 02/12/2014   Essential hypertension, benign 01/24/2010   Microcytic anemia 01/11/2010   Hyperlipemia 05/28/2007   Current smoker 05/28/2007   GERD 05/28/2007    Past Surgical History:  Procedure Laterality Date   ABDOMINAL HYSTERECTOMY  12/09   Secondary to fibroids   ARTERY BIOPSY Right 04/10/2018   Procedure: BIOPSY TEMPORAL ARTERY;  Surgeon: Rosetta Posner, MD;  Location: [facility];  Service: Vascular;  Laterality: Right;   [place]; 1988   COLONOSCOPY     FOOT SURGERY Left 03/2017   Bone Spurs Removed   LUMBAR LAMINECTOMY/DECOMPRESSION MICRODISCECTOMY N/A 04/13/2021   Procedure: LUMBAR FOUR- LUMBAR FIVE AND LUMBAR FIVE-SACRAL ONE  DECOMPRESSION;  Surgeon: Marybelle Killings, MD;  Location: [facility];  Service: Orthopedics;  Laterality: N/A;   RADIOLOGY WITH ANESTHESIA N/A 04/26/2018   Procedure:  MRI WITH ANESTHESIA;  Surgeon: Radiologist, Medication, MD;  Location: [facility];  Service: Radiology;  Laterality: N/A;   RADIOLOGY WITH ANESTHESIA N/A 08/24/2019   Procedure: MRI WITH ANESTHESIA CERVICAL SPINE;  Surgeon: Radiologist, Medication, MD;  Location: [facility];  Service: Radiology;  Laterality: N/A;   RADIOLOGY WITH ANESTHESIA N/A 10/02/2019   Procedure: MRI WITH ANESTHESIA CERVICAL WITHOUT CONTRAST;  Surgeon: Radiologist, Medication, MD;  Location: [facility];  Service: Radiology;  Laterality: N/A;   TUBAL LIGATION  1988    OB History   No  obstetric history on file.      Home Medications    Prior to Admission medications   Medication Sig Start Date End Date Taking? Authorizing Provider  predniSONE (STERAPRED UNI-PAK 21 TAB) 10 MG (21) TBPK tablet As directed 06/15/22  Yes Mikalyn Hermida K, PA-C  amLODipine (NORVASC) 5 MG tablet Take 1 tablet (5 mg total) by mouth daily. 06/10/21   Horald Pollen, MD  Cholecalciferol (VITAMIN D3) 50 MCG (2000 UT) capsule Take 1 capsule (2,000 Units total) by mouth daily. Patient not taking: Reported on 06/07/2022 05/16/22   Plotnikov, Evie Lacks, MD  diclofenac Sodium (VOLTAREN) 1 % GEL Apply 2 g topically 4 (four) times daily. 03/15/22   Henson, Vickie L, NP-C  fluticasone (FLONASE) 50 MCG/ACT nasal spray Place 2 sprays into both nostrils daily. 11/02/21   Henson, Vickie L, NP-C  folic acid (FOLVITE) 1 MG tablet Take 1 tablet (1 mg total) by mouth daily. 06/05/22   Heilingoetter, Cassandra L, PA-C  lipase/protease/amylase (CREON) 36000 UNITS CPEP capsule Take 3 capsules (108,000 Units total) by mouth 3 (three) times daily with meals AND 2 capsules (72,000 Units total) with snacks. 09/26/21   Irene Shipper, MD  pantoprazole (PROTONIX) 40 MG tablet Take 1 tablet (40 mg total) by mouth 2 (two) times daily. Take 30 minutes before breakfast. 03/01/22   Noralyn Pick, NP  potassium chloride SA (KLOR-CON M) 20 MEQ tablet Take 1 tablet (20 mEq total) by mouth daily. 06/07/22   Henson, Vickie L, NP-C  Vitamin D, Ergocalciferol, (DRISDOL) 1.25 MG (50000 UNIT) CAPS capsule Take 1 capsule (50,000 Units total) by mouth every 7 (seven) days. 06/07/22   Girtha Rm, NP-C    Family History Family History  Problem Relation Age of Onset   Cancer Mother 52       pancreatic cancer   Pancreatic cancer Mother    Heart disease Mother    Cancer Father 48       throat cancer   Esophageal cancer Father    Cancer Maternal Aunt 29       breast cancer   Cancer Other        Died  from Colon cancer  in 60's   Colon cancer Maternal Uncle    Colon cancer Paternal Uncle    Heart disease Sister    Rectal cancer Neg Hx    Stomach cancer Neg Hx     Social History Social History   Tobacco Use   Smoking status: Some Days    Packs/day: 0.25    Years: 38.00    Total pack years: 9.50    Types: Cigarettes   Smokeless tobacco: Never   Tobacco comments:    0-4 cigs/day   Vaping Use   Vaping Use: Never used  Substance Use Topics   Alcohol use: Yes    Alcohol/week: 4.0 standard drinks of alcohol    Types: 4 Cans of beer per week  Drug use: Not Currently     Allergies   Aspirin, Hydromorphone hcl, Crestor [rosuvastatin], Lisinopril, and Latex   Review of Systems Review of Systems  Constitutional:  Positive for activity change. Negative for appetite change, fatigue and fever.  Musculoskeletal:  Positive for arthralgias, gait problem and joint swelling. Negative for myalgias.  Neurological:  Negative for weakness and numbness.     Physical Exam Triage Vital Signs ED Triage Vitals  Enc Vitals Group     BP 06/15/22 1058 (!) 161/107     Pulse Rate 06/15/22 1058 92     Resp 06/15/22 1058 18     Temp 06/15/22 1058 98.4 F (36.9 C)     Temp Source 06/15/22 1058 Oral     SpO2 06/15/22 1058 96 %     Weight --      Height --      Head Circumference --      Peak Flow --      Pain Score 06/15/22 1054 10     Pain Loc --      Pain Edu? --      Excl. in [place]? --    No data found.  Updated Vital Signs BP (!) 157/97 (BP Location: Left Arm) Comment: repositioned  Pulse 92   Temp 98.4 F (36.9 C) (Oral)   Resp 18   LMP  (LMP Unknown)   SpO2 96%   Visual Acuity Right Eye Distance:   Left Eye Distance:   Bilateral Distance:    Right Eye Near:   Left Eye Near:    Bilateral Near:     Physical Exam Vitals reviewed.  Constitutional:      General: She is awake. She is not in acute distress.    Appearance: Normal appearance. She is well-developed. She is not  ill-appearing.     Comments: Very pleasant female appears stated age in no acute distress sitting comfortably in exam room  HENT:     Head: Normocephalic and atraumatic.  Cardiovascular:     Rate and Rhythm: Normal rate and regular rhythm.     Pulses:          Posterior tibial pulses are 2+ on the left side.     Heart sounds: Normal heart sounds, S1 normal and S2 normal. No murmur heard.    Comments: Capillary refill within 2 seconds left toes Pulmonary:     Effort: Pulmonary effort is normal.     Breath sounds: Normal breath sounds. No wheezing, rhonchi or rales.  Musculoskeletal:     Left foot: Normal range of motion. No deformity or bunion.       Feet:  Feet:     Left foot:     Protective Sensation: 10 sites tested.  10 sites sensed.     Skin integrity: No ulcer, blister or skin breakdown.     Comments: Left foot: Tender palpation over calcaneus and along medial arch.  Foot neurovascularly intact.  Swelling without deformity noted in affected area. Psychiatric:        Behavior: Behavior is cooperative.      UC Treatments / Results  Labs (all labs ordered are listed, but only abnormal results are displayed) Labs Reviewed - No data to display  EKG   Radiology DG Foot Complete Left  Result Date: 06/15/2022 CLINICAL DATA:  Pain and swelling medial foot.  History of surgery. EXAM: LEFT FOOT - COMPLETE 3+ VIEW COMPARISON:  09/19/2021 FINDINGS: Mild hallux valgus. Minimal degenerative change first MTP. No  fracture. No other arthropathy. Mild spurring in the calcaneus. IMPRESSION: Mild hallux valgus. No acute abnormality. Electronically Signed   By: Franchot Gallo M.D.   On: 06/15/2022 11:45    Procedures Procedures (including critical care time)  Medications Ordered in UC Medications - No data to display  Initial Impression / Assessment and Plan / UC Course  I have reviewed the triage vital signs and the nursing notes.  Pertinent labs & imaging results that were  available during my care of the patient were reviewed by me and considered in my medical decision making (see chart for details).     Patient is well-appearing, afebrile, nontoxic, nontachycardic.  X-ray of foot was obtained and showed calcaneal spurs and hallux valgus but no acute abnormality.  Discussed that symptoms are likely related to overuse and encouraged her to rest and keep foot elevated.  She can use cam boot as needed for comfort and support.  She has been taking high-dose ibuprofen without improvement of symptoms so we will try prednisone taper.  She was instructed to avoid NSAIDs with this medication due to risk of GI bleeding but can use acetaminophen/Tylenol for pain relief.  She is scheduled to see podiatry next week, strongly encouraged to keep this appointment.  She was provided a work excuse note.  Discussed that if anything worsens and she has increasing pain, swelling, numbness/paresthesias, fever she should be reevaluated.  Strict return precautions given.  Final Clinical Impressions(s) / UC Diagnoses   Final diagnoses:  Left foot pain     Discharge Instructions      Your x-ray did not show any fracture which is great news.  I suspect that your symptoms are related to recurrent heel spurs and other symptoms.  Since you continue to have symptoms despite ibuprofen we will try prednisone taper.  Do not take NSAIDs with this medication including aspirin, ibuprofen/Advil, naproxen/Aleve.  Follow-up with podiatry next week as scheduled.  If anything worsens you have increasing pain, redness, swelling, weakness, numbness or tingling in your foot you should be seen again.     ED Prescriptions     Medication Sig Dispense Auth. Provider   predniSONE (STERAPRED UNI-PAK 21 TAB) 10 MG (21) TBPK tablet As directed 21 tablet Taim Wurm K, PA-C      PDMP not reviewed this encounter.   Terrilee Croak, PA-C 06/15/22 1156

## 2022-06-15 NOTE — ED Triage Notes (Signed)
Reports surgery 2017  Patient has an appt on Thursday 06/22/2022 Left heel pain for 3 days. Patient is wearing a cam walker.  Continues to have pain  Denies fall Pain feels like the same pain as in the past Has taken ibuprofen.    Patient has been out of work.  Patient is a custodian at a school

## 2022-06-15 NOTE — Discharge Instructions (Addendum)
Your x-ray did not show any fracture which is great news.  I suspect that your symptoms are related to recurrent heel spurs and other symptoms.  Since you continue to have symptoms despite ibuprofen we will try prednisone taper.  Do not take NSAIDs with this medication including aspirin, ibuprofen/Advil, naproxen/Aleve.  Follow-up with podiatry next week as scheduled.  If anything worsens you have increasing pain, redness, swelling, weakness, numbness or tingling in your foot you should be seen again.

## 2022-06-21 ENCOUNTER — Ambulatory Visit: Payer: 59 | Admitting: Family Medicine

## 2022-06-22 ENCOUNTER — Encounter: Payer: Self-pay | Admitting: Podiatry

## 2022-06-22 ENCOUNTER — Ambulatory Visit: Payer: 59 | Admitting: Podiatry

## 2022-06-22 DIAGNOSIS — M722 Plantar fascial fibromatosis: Secondary | ICD-10-CM

## 2022-06-22 MED ORDER — TRIAMCINOLONE ACETONIDE 10 MG/ML IJ SUSP
10.0000 mg | Freq: Once | INTRAMUSCULAR | Status: AC
  Administered 2022-06-22: 10 mg

## 2022-06-23 ENCOUNTER — Ambulatory Visit: Payer: 59 | Admitting: Podiatry

## 2022-06-23 NOTE — Progress Notes (Signed)
Subjective:   Patient ID: [name], female   DOB: 61 y.o.   MRN: [id number]   HPI Patient states that she was doing well and that her heel started to hurt again and she is wearing her boot.  States that it is not gotten better recently but the boot helps   ROS      Objective:  Physical Exam  Neurovascular status intact intense discomfort left plantar fascia at the insertional point of the tendon calcaneus inflammation fluid around the medial band     Assessment:  Acute plantar fasciitis left inflammation fluid in the medial band     Plan:  Sterile prep went ahead today and did inject the fascia at insertion 3 mg Kenalog 5 mg Xylocaine discussed supportive shoes which she is wearing now and possibility for orthotics and will see patient back depending on the response to conservative treatment

## 2022-07-03 ENCOUNTER — Other Ambulatory Visit (HOSPITAL_COMMUNITY): Payer: Self-pay

## 2022-07-03 ENCOUNTER — Ambulatory Visit: Payer: 59

## 2022-07-03 ENCOUNTER — Other Ambulatory Visit: Payer: Self-pay | Admitting: Family Medicine

## 2022-07-03 DIAGNOSIS — I10 Essential (primary) hypertension: Secondary | ICD-10-CM

## 2022-07-03 DIAGNOSIS — E876 Hypokalemia: Secondary | ICD-10-CM

## 2022-07-03 MED ORDER — AMLODIPINE BESYLATE 5 MG PO TABS
5.0000 mg | ORAL_TABLET | Freq: Every day | ORAL | 0 refills | Status: DC
  Filled 2022-07-03: qty 90, 90d supply, fill #0

## 2022-07-04 ENCOUNTER — Telehealth: Payer: Self-pay | Admitting: Family Medicine

## 2022-07-04 ENCOUNTER — Other Ambulatory Visit (HOSPITAL_COMMUNITY): Payer: Self-pay

## 2022-07-04 ENCOUNTER — Other Ambulatory Visit: Payer: Self-pay

## 2022-07-04 DIAGNOSIS — E876 Hypokalemia: Secondary | ICD-10-CM

## 2022-07-04 NOTE — Telephone Encounter (Signed)
Patient wants to know if she is still supposed to be taking her potassium chloride - pharmacy told patient that it was not refilled.  Please call patient at [phone]

## 2022-07-05 ENCOUNTER — Other Ambulatory Visit (HOSPITAL_COMMUNITY): Payer: Self-pay

## 2022-07-05 MED ORDER — POTASSIUM CHLORIDE CRYS ER 10 MEQ PO TBCR
10.0000 meq | EXTENDED_RELEASE_TABLET | Freq: Every day | ORAL | 0 refills | Status: DC
  Filled 2022-07-05: qty 10, 10d supply, fill #0

## 2022-07-05 NOTE — Telephone Encounter (Signed)
Pt wants to know if she should continue taking Potassium? I dont see where we denied the refill.Marland KitchenMarland Kitchen

## 2022-07-05 NOTE — Telephone Encounter (Signed)
Refill sent for 10 days, pt will need to f/u after. ATC pt to schedule but phone was not able to leave VM. Will call again later

## 2022-07-05 NOTE — Telephone Encounter (Signed)
PT calls back today regarding this. Informed PT that the 10 day refill was in and I was able to schedule her for a follow up on 01/17. 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

## 2022-07-06 ENCOUNTER — Other Ambulatory Visit (HOSPITAL_COMMUNITY): Payer: Self-pay

## 2022-07-11 ENCOUNTER — Other Ambulatory Visit: Payer: Self-pay | Admitting: Family Medicine

## 2022-07-11 ENCOUNTER — Other Ambulatory Visit (HOSPITAL_COMMUNITY): Payer: Self-pay

## 2022-07-11 DIAGNOSIS — E559 Vitamin D deficiency, unspecified: Secondary | ICD-10-CM

## 2022-07-12 ENCOUNTER — Ambulatory Visit: Payer: Self-pay | Admitting: Family Medicine

## 2022-07-13 ENCOUNTER — Other Ambulatory Visit (HOSPITAL_COMMUNITY): Payer: Self-pay

## 2022-07-17 ENCOUNTER — Other Ambulatory Visit: Payer: Self-pay | Admitting: Nurse Practitioner

## 2022-07-17 ENCOUNTER — Other Ambulatory Visit (HOSPITAL_COMMUNITY): Payer: Self-pay

## 2022-07-17 MED ORDER — PANTOPRAZOLE SODIUM 40 MG PO TBEC
40.0000 mg | DELAYED_RELEASE_TABLET | Freq: Two times a day (BID) | ORAL | 1 refills | Status: DC
  Filled 2022-07-17: qty 60, 30d supply, fill #0
  Filled 2022-08-29: qty 60, 30d supply, fill #1

## 2022-08-01 ENCOUNTER — Other Ambulatory Visit: Payer: Self-pay | Admitting: Family Medicine

## 2022-08-01 ENCOUNTER — Other Ambulatory Visit (HOSPITAL_COMMUNITY): Payer: Self-pay

## 2022-08-01 DIAGNOSIS — E876 Hypokalemia: Secondary | ICD-10-CM

## 2022-08-01 MED ORDER — POTASSIUM CHLORIDE CRYS ER 10 MEQ PO TBCR
10.0000 meq | EXTENDED_RELEASE_TABLET | Freq: Every day | ORAL | 0 refills | Status: DC
  Filled 2022-08-01: qty 10, 10d supply, fill #0

## 2022-08-01 NOTE — Telephone Encounter (Signed)
Would you like pt to continue this medication? Was last given 10 more tablets on 07/05/22

## 2022-08-01 NOTE — Addendum Note (Signed)
Addended by: Rossie Muskrat on: 08/01/2022 10:55 AM   Modules accepted: Orders

## 2022-08-01 NOTE — Telephone Encounter (Signed)
Provider would like OV to determine why pt has low potassium, scheduled OV for thursday before any more refills given

## 2022-08-02 ENCOUNTER — Other Ambulatory Visit (HOSPITAL_COMMUNITY): Payer: Self-pay

## 2022-08-02 ENCOUNTER — Telehealth: Payer: Self-pay | Admitting: Podiatry

## 2022-08-02 ENCOUNTER — Ambulatory Visit (INDEPENDENT_AMBULATORY_CARE_PROVIDER_SITE_OTHER): Payer: 59 | Admitting: Podiatry

## 2022-08-02 DIAGNOSIS — M722 Plantar fascial fibromatosis: Secondary | ICD-10-CM

## 2022-08-02 NOTE — Progress Notes (Signed)
Subjective:   Patient ID: [name], female   DOB: 62 y.o.   MRN: [id number]   HPI Patient states her heel continues to hurt her and is giving her trouble and into her arch and she has flatfoot would like to avoid surgery did have endoscopic surgery 6 years ago   ROS      Objective:  Physical Exam  Neurovascular status intact does still have quite a bit of discomfort in the left plantar fascia at the insertional point tendon calcaneus inflammation fluid buildup noted and has forefoot pain also with depression of the arch      Assessment:  Most of this problem seems to be related to the arch Structure and that it has created chronic discomfort     Plan:  H&P reviewed condition great length discussed treatment options would like to avoid surgery and at this time casted for functional orthotics to lift the arch take stress off the fascia and reduce all pressure points.  Patient will be seen back when ready placed on oral anti-inflammatory today

## 2022-08-02 NOTE — Telephone Encounter (Signed)
The sorrow

## 2022-08-03 ENCOUNTER — Telehealth: Payer: Self-pay | Admitting: Podiatry

## 2022-08-03 ENCOUNTER — Ambulatory Visit (INDEPENDENT_AMBULATORY_CARE_PROVIDER_SITE_OTHER): Payer: 59 | Admitting: Family Medicine

## 2022-08-03 ENCOUNTER — Encounter: Payer: Self-pay | Admitting: Family Medicine

## 2022-08-03 ENCOUNTER — Other Ambulatory Visit (HOSPITAL_COMMUNITY): Payer: Self-pay

## 2022-08-03 ENCOUNTER — Other Ambulatory Visit: Payer: Self-pay | Admitting: Family Medicine

## 2022-08-03 VITALS — BP 124/86 | HR 93 | Temp 97.6°F | Ht <= 58 in | Wt 138.0 lb

## 2022-08-03 DIAGNOSIS — E876 Hypokalemia: Secondary | ICD-10-CM

## 2022-08-03 DIAGNOSIS — I10 Essential (primary) hypertension: Secondary | ICD-10-CM

## 2022-08-03 DIAGNOSIS — R7989 Other specified abnormal findings of blood chemistry: Secondary | ICD-10-CM

## 2022-08-03 DIAGNOSIS — R61 Generalized hyperhidrosis: Secondary | ICD-10-CM | POA: Diagnosis not present

## 2022-08-03 DIAGNOSIS — K529 Noninfective gastroenteritis and colitis, unspecified: Secondary | ICD-10-CM

## 2022-08-03 LAB — MAGNESIUM: Magnesium: 1.1 mg/dL — ABNORMAL LOW (ref 1.5–2.5)

## 2022-08-03 LAB — CBC WITH DIFFERENTIAL/PLATELET
Basophils Absolute: 0 10*3/uL (ref 0.0–0.1)
Basophils Relative: 0.4 % (ref 0.0–3.0)
Eosinophils Absolute: 0.1 10*3/uL (ref 0.0–0.7)
Eosinophils Relative: 1.7 % (ref 0.0–5.0)
HCT: 33.8 % — ABNORMAL LOW (ref 36.0–46.0)
Hemoglobin: 10.7 g/dL — ABNORMAL LOW (ref 12.0–15.0)
Lymphocytes Relative: 45.4 % (ref 12.0–46.0)
Lymphs Abs: 3 10*3/uL (ref 0.7–4.0)
MCHC: 31.6 g/dL (ref 30.0–36.0)
MCV: 71.4 fl — ABNORMAL LOW (ref 78.0–100.0)
Monocytes Absolute: 0.5 10*3/uL (ref 0.1–1.0)
Monocytes Relative: 7.6 % (ref 3.0–12.0)
Neutro Abs: 3 10*3/uL (ref 1.4–7.7)
Neutrophils Relative %: 44.9 % (ref 43.0–77.0)
Platelets: 237 10*3/uL (ref 150.0–400.0)
RBC: 4.73 Mil/uL (ref 3.87–5.11)
RDW: 14.6 % (ref 11.5–15.5)
WBC: 6.7 10*3/uL (ref 4.0–10.5)

## 2022-08-03 LAB — BASIC METABOLIC PANEL
BUN: 16 mg/dL (ref 6–23)
CO2: 28 mEq/L (ref 19–32)
Calcium: 8.8 mg/dL (ref 8.4–10.5)
Chloride: 102 mEq/L (ref 96–112)
Creatinine, Ser: 0.65 mg/dL (ref 0.40–1.20)
GFR: 95.4 mL/min (ref >60.00)
Glucose, Bld: 92 mg/dL (ref 70–99)
Potassium: 3.4 mEq/L — ABNORMAL LOW (ref 3.5–5.1)
Sodium: 142 mEq/L (ref 135–145)

## 2022-08-03 LAB — HEPATIC FUNCTION PANEL
ALT: 64 U/L — ABNORMAL HIGH (ref 0–35)
AST: 48 U/L — ABNORMAL HIGH (ref 0–37)
Albumin: 4.2 g/dL (ref 3.5–5.2)
Alkaline Phosphatase: 79 U/L (ref 39–117)
Bilirubin, Direct: 0.1 mg/dL (ref 0.0–0.3)
Total Bilirubin: 0.3 mg/dL (ref 0.2–1.2)
Total Protein: 6.9 g/dL (ref 6.0–8.3)

## 2022-08-03 LAB — TSH: TSH: 0.99 u[IU]/mL (ref 0.35–5.50)

## 2022-08-03 LAB — T4, FREE: Free T4: 0.67 ng/dL (ref 0.60–1.60)

## 2022-08-03 MED ORDER — POTASSIUM CHLORIDE CRYS ER 10 MEQ PO TBCR
10.0000 meq | EXTENDED_RELEASE_TABLET | Freq: Every day | ORAL | 1 refills | Status: DC
  Filled 2022-08-03: qty 30, 30d supply, fill #0
  Filled 2022-08-29: qty 30, 30d supply, fill #1

## 2022-08-03 NOTE — Progress Notes (Signed)
Subjective:     Patient ID: [name], female    DOB: 11/07/1960, 62 y.o.   MRN: [id number]  Chief Complaint  Patient presents with   Follow-up    Potassium f/u    HPI Patient is in today for follow up on hypokalemia. She is not on a diuretic. She does have chronic diarrhea.  Reports 2-3 bowel movements per day. She often has watery stools. Has not followed up with GI for this or for elevated LFTs.   4 wk hx of night sweats.   Denies fever, chills, dizziness, chest pain, palpitations, shortness of breath, cough, abdominal pain, N/V/, urinary symptoms, LE edema.    Health Maintenance Due  Topic Date Due   MAMMOGRAM  10/22/2021    Past Medical History:  Diagnosis Date   Anemia    Arthritis    "neck, hips" (04/25/2018)   Claustrophobia    Daily headache    GERD (gastroesophageal reflux disease)    Hiatal hernia    Hyperlipidemia    "hx" (04/25/2018)   Hypertension    Plantar fasciitis    hx - left foot   Stroke ([place]) 05/2014   "mini stroke" (04/25/2018)    Past Surgical History:  Procedure Laterality Date   ABDOMINAL HYSTERECTOMY  12/09   Secondary to fibroids   ARTERY BIOPSY Right 04/10/2018   Procedure: BIOPSY TEMPORAL ARTERY;  Surgeon: Rosetta Posner, MD;  Location: [facility];  Service: Vascular;  Laterality: Right;   [place]; 1988   COLONOSCOPY     FOOT SURGERY Left 03/2017   Bone Spurs Removed   LUMBAR LAMINECTOMY/DECOMPRESSION MICRODISCECTOMY N/A 04/13/2021   Procedure: LUMBAR FOUR- LUMBAR FIVE AND LUMBAR FIVE-SACRAL ONE  DECOMPRESSION;  Surgeon: Marybelle Killings, MD;  Location: [facility];  Service: Orthopedics;  Laterality: N/A;   RADIOLOGY WITH ANESTHESIA N/A 04/26/2018   Procedure: MRI WITH ANESTHESIA;  Surgeon: Radiologist, Medication, MD;  Location: [facility];  Service: Radiology;  Laterality: N/A;   RADIOLOGY WITH ANESTHESIA N/A 08/24/2019   Procedure: MRI WITH ANESTHESIA CERVICAL SPINE;  Surgeon: Radiologist, Medication, MD;  Location: [facility];   Service: Radiology;  Laterality: N/A;   RADIOLOGY WITH ANESTHESIA N/A 10/02/2019   Procedure: MRI WITH ANESTHESIA CERVICAL WITHOUT CONTRAST;  Surgeon: Radiologist, Medication, MD;  Location: [facility];  Service: Radiology;  Laterality: N/A;   TUBAL LIGATION  1988    Family History  Problem Relation Age of Onset   Cancer Mother 36       pancreatic cancer   Pancreatic cancer Mother    Heart disease Mother    Cancer Father 36       throat cancer   Esophageal cancer Father    Cancer Maternal Aunt 47       breast cancer   Cancer Other        Died  from Colon cancer in 60's   Colon cancer Maternal Uncle    Colon cancer Paternal Uncle    Heart disease Sister    Rectal cancer Neg Hx    Stomach cancer Neg Hx     Social History   Socioeconomic History   Marital status: Divorced    Spouse name: Not on file   Number of children: Not on file   Years of education: Not on file   Highest education level: Not on file  Occupational History   Occupation: unemployed    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   Social Determinants of Health   Financial Resource Strain: Not on file  Food Insecurity: Not on file  Transportation Needs: No Transportation Needs (08/27/2019)   PRAPARE - Hydrologist (Medical): No    Lack of Transportation (Non-Medical): No  Physical Activity: Not on file  Stress: Not  on file  Social Connections: Not on file  Intimate Partner Violence: Not on file    Outpatient Medications Prior to Visit  Medication Sig Dispense Refill   amLODipine (NORVASC) 5 MG tablet Take 1 tablet (5 mg total) by mouth daily. 90 tablet 0   diclofenac Sodium (VOLTAREN) 1 % GEL Apply 2 g topically 4 (four) times daily. 200 g 1   fluticasone (FLONASE) 50 MCG/ACT nasal spray Place 2 sprays into both nostrils daily. 16 g 1   folic acid (FOLVITE) 1 MG tablet Take 1 tablet (1 mg total) by mouth daily. 30 tablet 2   lipase/protease/amylase (CREON) 36000 UNITS CPEP capsule Take 3 capsules (108,000 Units total) by mouth 3 (three) times daily with meals AND 2 capsules (72,000 Units total) with snacks. 330 capsule 6   pantoprazole (PROTONIX) 40 MG tablet Take 1 tablet (40 mg total) by mouth 2 (two) times daily. Take 30 minutes before breakfast. 60 tablet 1   Vitamin D, Ergocalciferol, (DRISDOL) 1.25 MG (50000 UNIT) CAPS capsule Take 1 capsule (50,000 Units total) by mouth every 7 (seven) days. 4 capsule 0   Cholecalciferol (VITAMIN D3) 50 MCG (2000 UT) capsule Take 1 capsule (2,000 Units total) by mouth daily. (Patient not taking: Reported on 06/07/2022) 100 capsule 3   predniSONE (STERAPRED UNI-PAK 21 TAB) 10 MG (21) TBPK tablet take as directed (Patient not taking: Reported on 08/03/2022) 21 tablet 0   No facility-administered medications prior to visit.    Allergies  Allergen Reactions   Aspirin Hives   Hydromorphone Hcl Hives and Nausea And Vomiting    Can take Norco   Crestor [Rosuvastatin]     myalgias   Lisinopril     cough   Latex Rash    ROS     Objective:    Physical Exam Constitutional:      General: She is not in acute distress.    Appearance: She is not ill-appearing.  Cardiovascular:     Rate and Rhythm: Normal rate and regular rhythm.  Pulmonary:     Effort: Pulmonary effort is normal.     Breath sounds: Normal breath sounds.  Abdominal:     General: Bowel sounds  are normal. There is no distension.     Palpations: Abdomen is soft.     Tenderness: There is no abdominal tenderness. There is no guarding or rebound.  Skin:    General: Skin is warm and dry.  Neurological:  General: No focal deficit present.     Mental Status: She is alert and oriented to person, place, and time.  Psychiatric:        Mood and Affect: Mood normal.        Behavior: Behavior normal.     BP 124/86 (BP Location: Left Arm, Patient Position: Sitting, Cuff Size: Large)   Pulse 93   Temp 97.6 F (36.4 C) (Temporal)   Ht '4\' 10"'$  (1.473 m)   Wt 138 lb (62.6 kg)   LMP  (LMP Unknown)   SpO2 98%   BMI 28.84 kg/m  Wt Readings from Last 3 Encounters:  08/03/22 138 lb (62.6 kg)  06/07/22 139 lb (63 kg)  05/16/22 135 lb (61.2 kg)       Assessment & Plan:   Problem List Items Addressed This Visit       Cardiovascular and Mediastinum   Essential hypertension, benign (Chronic)   Relevant Orders   CBC with Differential/Platelet (Completed)     Digestive   Chronic diarrhea (Chronic)     Other   Elevated LFTs   Relevant Orders   CBC with Differential/Platelet (Completed)   Hepatic function panel (Completed)   Hypomagnesemia   Relevant Orders   Magnesium (Completed)   Other Visit Diagnoses     Hypokalemia    -  Primary   Relevant Orders   Basic metabolic panel (Completed)   Night sweats       Relevant Orders   CBC with Differential/Platelet (Completed)   TSH (Completed)   T4, free (Completed)      Check labs and follow up.  She will call and schedule with GI as recommended for chronic diarrhea and LFTs.  She will follow up with Dr. Earlie Server as recommended.  She may need a refill of potassium pending serum potassium. Hypokalemia most likely related to chronic diarrhea.   I have discontinued Ecko P. Fyfe's predniSONE. I am also having her maintain her lipase/protease/amylase, fluticasone, diclofenac Sodium, Vitamin D3, folic acid, Vitamin D  (Ergocalciferol), amLODipine, and pantoprazole.  No orders of the defined types were placed in this encounter.

## 2022-08-03 NOTE — Telephone Encounter (Signed)
Pt stated she was seen yesterday and was checking to see if her Rx was called for inflammation. Pt was also seeking a work note to be out of work for tomorrow.  Please advise

## 2022-08-03 NOTE — Patient Instructions (Addendum)
Please go downstairs for labs.   Please contact hematology, Dr. Inda Merlin at [phone] and  [facility] at [phone]

## 2022-08-04 ENCOUNTER — Other Ambulatory Visit: Payer: Self-pay | Admitting: Podiatry

## 2022-08-04 ENCOUNTER — Other Ambulatory Visit (HOSPITAL_COMMUNITY): Payer: Self-pay

## 2022-08-04 MED ORDER — DICLOFENAC SODIUM 75 MG PO TBEC
75.0000 mg | DELAYED_RELEASE_TABLET | Freq: Two times a day (BID) | ORAL | 2 refills | Status: DC
  Filled 2022-08-04: qty 50, 25d supply, fill #0
  Filled 2022-08-29: qty 50, 25d supply, fill #1

## 2022-08-04 NOTE — Telephone Encounter (Signed)
Called in.

## 2022-08-08 ENCOUNTER — Other Ambulatory Visit (HOSPITAL_COMMUNITY): Payer: Self-pay

## 2022-08-09 ENCOUNTER — Ambulatory Visit (HOSPITAL_COMMUNITY)
Admission: EM | Admit: 2022-08-09 | Discharge: 2022-08-09 | Disposition: A | Discharge status: Home / Self Care | Payer: BC Managed Care – PPO | Attending: Internal Medicine | Admitting: Internal Medicine

## 2022-08-09 ENCOUNTER — Encounter (HOSPITAL_COMMUNITY): Payer: Self-pay

## 2022-08-09 DIAGNOSIS — R5383 Other fatigue: Secondary | ICD-10-CM | POA: Insufficient documentation

## 2022-08-09 DIAGNOSIS — Z1152 Encounter for screening for COVID-19: Secondary | ICD-10-CM | POA: Insufficient documentation

## 2022-08-09 DIAGNOSIS — T50B95A Adverse effect of other viral vaccines, initial encounter: Secondary | ICD-10-CM | POA: Insufficient documentation

## 2022-08-09 DIAGNOSIS — R6889 Other general symptoms and signs: Secondary | ICD-10-CM

## 2022-08-09 LAB — POC INFLUENZA A AND B ANTIGEN (URGENT CARE ONLY)
Influenza A Ag: NEGATIVE
Influenza B Ag: NEGATIVE

## 2022-08-09 NOTE — Discharge Instructions (Signed)
Your flu test was negative today. Your COVID test is pending. I am hopeful that this is just a side effect of your vaccine, but we will let you know if your test is positive Stay hydrated get rest Do not return to work if you have a fever

## 2022-08-09 NOTE — Telephone Encounter (Signed)
Patient calling in to check on Covid results. Patient informed by front desk staff that tests are sill pending.

## 2022-08-09 NOTE — ED Provider Notes (Signed)
[place]   [id number] 08/09/22 Arrival Time: [id number]  ASSESSMENT & PLAN:  1. Fatigue after COVID-19 vaccination    -Patient presents with 2 days of fatigue, aches, chills, shakes, cough after getting her COVID-vaccine on Sunday.  I have high suspicion that this is likely a side effect of the COVID-vaccine, however, there is some overlap with viral URI type symptoms as well.  Her point-of-care flu test was negative today.  Her COVID test is pending.  Recommended rest, vigorous p.o. hydration, over-the-counter medications such as Mucinex and Robitussin for cough and alternating Tylenol and ibuprofen for aches.    No orders of the defined types were placed in this encounter.    Discharge Instructions      Your flu test was negative today. Your COVID test is pending. I am hopeful that this is just a side effect of your vaccine, but we will let you know if your test is positive Stay hydrated get rest Do not return to work if you have a fever        Reviewed expectations re: course of current medical issues. Questions answered. Outlined signs and symptoms indicating need for more acute intervention. Patient verbalized understanding. After Visit Summary given.   SUBJECTIVE: Pleasant 62 year old female comes to urgent care to be evaluated for 2 days of fatigue, aches, chills, cough, sore throat.  Her symptoms started on Monday.  The day prior on Sunday she got her COVID-vaccine.  She has generally felt fatigued and shaky.  She is unsure if she had a fever.  The cough is dry.  She reports some associated nausea but no emesis.  She has not tried taking any over-the-counter medications.  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  She did get her flu shot this year.  Denies any diarrhea, pain, shortness of breath.  No LMP recorded (lmp unknown). Patient has had a hysterectomy. Past Surgical History:  Procedure Laterality Date   ABDOMINAL  HYSTERECTOMY  12/09   Secondary to fibroids   ARTERY BIOPSY Right 04/10/2018   Procedure: BIOPSY TEMPORAL ARTERY;  Surgeon: Rosetta Posner, MD;  Location: [place];  Service: Vascular;  Laterality: Right;   [place]; 1988   COLONOSCOPY     FOOT SURGERY Left 03/2017   Bone Spurs Removed   LUMBAR LAMINECTOMY/DECOMPRESSION MICRODISCECTOMY N/A 04/13/2021   Procedure: LUMBAR FOUR- LUMBAR FIVE AND LUMBAR FIVE-SACRAL ONE  DECOMPRESSION;  Surgeon: Marybelle Killings, MD;  Location: [facility];  Service: Orthopedics;  Laterality: N/A;   RADIOLOGY WITH ANESTHESIA N/A 04/26/2018   Procedure: MRI WITH ANESTHESIA;  Surgeon: Radiologist, Medication, MD;  Location: [place];  Service: Radiology;  Laterality: N/A;   RADIOLOGY WITH ANESTHESIA N/A 08/24/2019   Procedure: MRI WITH ANESTHESIA CERVICAL SPINE;  Surgeon: Radiologist, Medication, MD;  Location: [place];  Service: Radiology;  Laterality: N/A;   RADIOLOGY WITH ANESTHESIA N/A 10/02/2019   Procedure: MRI WITH ANESTHESIA CERVICAL WITHOUT CONTRAST;  Surgeon: Radiologist, Medication, MD;  Location: [place];  Service: Radiology;  Laterality: N/A;   TUBAL LIGATION  1988     OBJECTIVE:  Vitals:   08/09/22 0824 08/09/22 0825  BP:  124/86  Pulse:  94  Resp:  16  Temp:  98 F (36.7 C)  TempSrc:  Oral  SpO2:  96%  Weight: 63 kg   Height: 4' 11"$  (1.499 m)      Physical Exam Vitals and nursing note reviewed.  Constitutional:  Appearance: Normal appearance. She is not ill-appearing.  HENT:     Head: Normocephalic.     Nose: Nose normal.     Mouth/Throat:     Pharynx: Posterior oropharyngeal erythema present. No oropharyngeal exudate.  Cardiovascular:     Rate and Rhythm: Normal rate and regular rhythm.     Heart sounds: Normal heart sounds.  Pulmonary:     Effort: Pulmonary effort is normal. No respiratory distress.     Breath sounds: Normal breath sounds. No wheezing.  Abdominal:     General: Abdomen is flat.     Palpations: Abdomen is soft.   Musculoskeletal:        General: Normal range of motion.     Cervical back: Normal range of motion.  Lymphadenopathy:     Cervical: Cervical adenopathy present.  Skin:    General: Skin is warm.  Neurological:     General: No focal deficit present.     Mental Status: She is alert.  Psychiatric:        Mood and Affect: Mood normal.      Labs: Results for orders placed or performed during the hospital encounter of 08/09/22  POC Influenza A & B Ag (Urgent Care)  Result Value Ref Range   Influenza A Ag Negative Negative   Influenza B Ag Negative Negative   Labs Reviewed  SARS CORONAVIRUS 2 (TAT 6-24 HRS)  POC INFLUENZA A AND B ANTIGEN (URGENT CARE ONLY)    Imaging: No results found.   Allergies  Allergen Reactions   Aspirin Hives   Hydromorphone Hcl Hives and Nausea And Vomiting    Can take Norco   Crestor [Rosuvastatin]     myalgias   Lisinopril     cough   Latex Rash                                               Past Medical History:  Diagnosis Date   Anemia    Arthritis    "neck, hips" (04/25/2018)   Claustrophobia    Daily headache    GERD (gastroesophageal reflux disease)    Hiatal hernia    Hyperlipidemia    "hx" (04/25/2018)   Hypertension    Plantar fasciitis    hx - left foot   Stroke ([place]) 05/2014   "mini stroke" (04/25/2018)    Social History   Socioeconomic History   Marital status: Divorced    Spouse name: Not on file   Number of children: Not on file   Years of education: Not on file   Highest education level: Not on file  Occupational History   Occupation: unemployed    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   Social Determinants of Health   Financial Resource Strain: Not on file  Food Insecurity: Not on file  Transportation Needs: No Transportation Needs (08/27/2019)   PRAPARE - Hydrologist (Medical): No    Lack of Transportation (Non-Medical): No  Physical Activity: Not on file  Stress: Not on file  Social Connections: Not on file  Intimate Partner Violence: Not on file    Family History  Problem Relation Age of Onset   Cancer Mother 40       pancreatic cancer   Pancreatic cancer Mother    Heart disease Mother    Cancer Father 38       throat cancer   Esophageal cancer Father    Cancer Maternal Aunt 92       breast cancer   Cancer Other        Died  from Colon cancer in 60's   Colon cancer Maternal Uncle    Colon cancer Paternal Uncle    Heart disease Sister    Rectal cancer Neg Hx    Stomach cancer Neg Hx       Jaelan Rasheed, Dorian Pod, MD 08/09/22 [phone]

## 2022-08-09 NOTE — ED Triage Notes (Signed)
Chief Complaint: fatigues, chills, dry cough, bloody nose. No fever, diarrhea or vomiting. Some nausea.   Onset: Monday   Prescriptions or OTC medications tried: No    Sick exposure: Yes- works at [employer], medications, or products: Yes- Covid vaccine Sunday morning  Recent Travel: No

## 2022-08-10 LAB — SARS CORONAVIRUS 2 (TAT 6-24 HRS): SARS Coronavirus 2: NEGATIVE

## 2022-08-11 ENCOUNTER — Telehealth (HOSPITAL_COMMUNITY): Payer: Self-pay

## 2022-08-17 ENCOUNTER — Other Ambulatory Visit (HOSPITAL_COMMUNITY): Payer: Self-pay

## 2022-08-17 ENCOUNTER — Ambulatory Visit (INDEPENDENT_AMBULATORY_CARE_PROVIDER_SITE_OTHER): Payer: 59 | Admitting: Internal Medicine

## 2022-08-17 ENCOUNTER — Encounter: Payer: Self-pay | Admitting: Internal Medicine

## 2022-08-17 VITALS — BP 136/80 | HR 97 | Temp 98.3°F | Ht 59.0 in | Wt 135.4 lb

## 2022-08-17 DIAGNOSIS — R2689 Other abnormalities of gait and mobility: Secondary | ICD-10-CM | POA: Diagnosis not present

## 2022-08-17 DIAGNOSIS — G8929 Other chronic pain: Secondary | ICD-10-CM

## 2022-08-17 DIAGNOSIS — R519 Headache, unspecified: Secondary | ICD-10-CM | POA: Diagnosis not present

## 2022-08-17 DIAGNOSIS — U099 Post covid-19 condition, unspecified: Secondary | ICD-10-CM

## 2022-08-17 MED ORDER — AMITRIPTYLINE HCL 10 MG PO TABS
10.0000 mg | ORAL_TABLET | Freq: Every day | ORAL | 0 refills | Status: DC
  Filled 2022-08-17: qty 30, 30d supply, fill #0

## 2022-08-17 NOTE — Progress Notes (Signed)
Chief Complaint  Patient presents with   Headache    Pt c/o headache on frontal and both temporal area. Going on since Monday. She states it is constant pain. Taking tylenol. Associate with lightheaded and dizzy.    Shoulder Pain    Pt c/o L shoulder pain. Sometimes radiates to down to L fingers. DROM. Going on 2-3 days ago.     HPI: [name] 62 y.o. come in for  problem  acute sda  PCP NA  Ever since covid in fall 23 has had problem with [facility]  [facility]  some better and and then  worse in the past few weeks :  having headache off balance at times.but no falling  Feeling of falling.   Recenting    Shoulder  may be arthrits  Constant .   These headaches  dark room helps  a bit .   Some photophobia .  Sees spots .  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  No tobacco  ocass etoh 1 2 x per week  Sleep somewhat Headache  wakes up some  Lef shoulder bothersome  for day  worse whe moving shoulder certain way rotation  no weakness  ROS: See pertinent positives and negatives per HPI. Has underlying abn lfts  pancreatic ins suff   low  potassa and now anemia   no bleeding but did have a nose bleed once  Hx of back surgery  Past Medical History:  Diagnosis Date   Anemia    Arthritis    "neck, hips" (04/25/2018)   Claustrophobia    Daily headache    GERD (gastroesophageal reflux disease)    Hiatal hernia    Hyperlipidemia    "hx" (04/25/2018)   Hypertension    Plantar fasciitis    hx - left foot   Stroke ([place]) 05/2014   "mini stroke" (04/25/2018)    Family History  Problem Relation Age of Onset   Cancer Mother 10       pancreatic cancer   Pancreatic cancer Mother    Heart disease Mother    Cancer Father 73       throat cancer   Esophageal cancer Father    Cancer Maternal Aunt 4       breast cancer   Cancer Other        Died  from Colon cancer in 60's   Colon cancer Maternal Uncle     Colon cancer Paternal Uncle    Heart disease Sister    Rectal cancer Neg Hx    Stomach cancer Neg Hx     Social History   Socioeconomic History   Marital status: Divorced    Spouse name: Not on file   Number of children: Not on file   Years of education: Not on file   Highest education level: Not on file  Occupational History   Occupation: unemployed    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   Social Determinants of Health   Financial Resource Strain: Not on file  Food Insecurity: Not on file  Transportation Needs: No Transportation Needs (08/27/2019)   PRAPARE - Hydrologist (Medical): No    Lack of Transportation (Non-Medical): No  Physical Activity: Not on file  Stress: Not on file  Social Connections: Not on file    Outpatient Medications Prior to Visit  Medication Sig Dispense Refill   amLODipine (NORVASC) 5 MG tablet Take 1 tablet (5 mg total) by mouth daily. 90 tablet 0   Cholecalciferol (VITAMIN D3) 50 MCG (2000 UT) capsule Take 1 capsule (2,000 Units total) by mouth daily. 100 capsule 3   diclofenac (VOLTAREN) 75 MG EC tablet Take 1 tablet (75 mg total) by mouth 2 (two) times daily. 50 tablet 2   diclofenac Sodium (VOLTAREN) 1 % GEL Apply 2 g topically 4 (four) times daily. 200 g 1   fluticasone (FLONASE) 50 MCG/ACT nasal spray Place 2 sprays into both  nostrils daily. 16 g 1   folic acid (FOLVITE) 1 MG tablet Take 1 tablet (1 mg total) by mouth daily. 30 tablet 2   lipase/protease/amylase (CREON) 36000 UNITS CPEP capsule Take 3 capsules (108,000 Units total) by mouth 3 (three) times daily with meals AND 2 capsules (72,000 Units total) with snacks. 330 capsule 6   pantoprazole (PROTONIX) 40 MG tablet Take 1 tablet (40 mg total) by mouth 2 (two) times daily. Take 30 minutes before breakfast. 60 tablet 1   potassium chloride (KLOR-CON M) 10 MEQ tablet Take 1 tablet (10 mEq total) by mouth daily. 30 tablet 1   Vitamin D, Ergocalciferol, (DRISDOL) 1.25 MG (50000 UNIT) CAPS capsule Take 1 capsule (50,000 Units total) by mouth every 7 (seven) days. 4 capsule 0   No facility-administered medications prior to visit.     EXAM:  BP 136/80 (BP Location: Right Arm, Patient Position: Sitting, Cuff Size: Normal)   Pulse 97   Temp 98.3 F (36.8 C) (Oral)   Ht 4' 11"$  (1.499 m)   Wt 135 lb 6.4 oz (61.4 kg)   LMP  (LMP Unknown)   SpO2 99%   BMI 27.35 kg/m   Body mass index is 27.35 kg/m.  GENERAL: vitals reviewed and listed above, alert, oriented, appears well hydrated and in no acute distress mased   HEENT: atraumatic, conjunctiva  clear, no obvious abnormalities on inspection of external nose and earstm ok  OP : masked  NECK: no obvious masses on inspection palpation  LUNGS: clear to auscultation bilaterally, no wheezes, rales or rhonchi, good air movement CV: HRRR, no clubbing cyanosis or  peripheral edema nl cap refill  Abdomen:  Sof,t normal bowel sounds without hepatosplenomegaly, no guarding rebound or masses no CVA tenderness MS: moves all extremities without noticeable focal  abnormality PSYCH: pleasant and cooperative, no obvious depression  mild anxiety    Eomss  NEURO: oriented x 3 CN 3-12 appear intact. No focal muscle weakness or atrophy. DTRs symmetrical. Gait WNL.  Grossly non focal. No tremor or abnormal movement.  F to nose ok   neg rhomberg  gait seems steady  no tremor  Lab Results  Component Value Date   WBC 6.7 08/03/2022   HGB 10.7 (L) 08/03/2022   HCT 33.8 (L) 08/03/2022   PLT 237.0 08/03/2022   GLUCOSE 92 08/03/2022   CHOL 201 (H) 05/08/2022   TRIG 231.0 (H) 05/08/2022   HDL 47.30 05/08/2022   LDLDIRECT 107.0 05/08/2022   LDLCALC 43 10/20/2021   ALT 64 (H) 08/03/2022   AST 48 (H) 08/03/2022   NA 142 08/03/2022   K 3.4 (L) 08/03/2022   CL 102 08/03/2022   CREATININE 0.65 08/03/2022   BUN 16 08/03/2022   CO2 28 08/03/2022   TSH 0.99 08/03/2022   INR 0.96 06/12/2014   HGBA1C 5.9 10/20/2021   BP Readings from Last 3 Encounters:  08/17/22 136/80  08/09/22 124/86  08/03/22 124/86    ASSESSMENT AND PLAN:  Discussed the following assessment and plan:  Headache, unspecified headache type - Plan: MR Brain Wo Contrast, Ambulatory referral to Neurology  Imbalance - feeloing agit seems pretty good today - Plan: MR Brain Wo Contrast, Ambulatory referral to Neurology  Post-COVID chronic headache - Plan: MR Brain Wo Contrast, Ambulatory referral to Neurology Sx began post covid  did get worse after vaccine 2 weeks ago .  Some components migrainous . [Post hx of"  ministroke"years ago  . Dobut but reports balance feels off   fortuantely gait and exam today ok .  Avoid all alcohol although says not taking much  avoiding nsaids as allergic to asa .  Since pcp NA   will order mri brain  and neuro referral   Trial of elavil 10 at night    Shoulder seems ms  mechaical  topicals and consider sem if  persistent or progressive seems unrelated .  -Patient advised to return or notify health care team  if  new concerns arise.  Patient Instructions  Exam is reassuring but wonder if   covid and vaccine triggered a new ha migraine scenario .  Will do a referral to neurology and  order mri brain to be sure no other cause of  imbalance feeling   Can try  low dose  amitriptyline 10 mg at  to see if helps .  Over 2 weeks  but  cannot guarantee . Will help .     Standley Brooking. Viney Acocella M.D.

## 2022-08-17 NOTE — Patient Instructions (Addendum)
Exam is reassuring but wonder if   covid and vaccine triggered a new ha migraine scenario .  Will do a referral to neurology and  order mri brain to be sure no other cause of  imbalance feeling   Can try  low dose  amitriptyline 10 mg at  to see if helps . Over 2 weeks  but  cannot guarantee . Will help .

## 2022-08-21 ENCOUNTER — Telehealth: Payer: Self-pay | Admitting: Family Medicine

## 2022-08-21 NOTE — Telephone Encounter (Signed)
Pt has MRI scheduled and is claustrophobic and is wondering if you could send in a mild sedative so that she can do the MRI?

## 2022-08-21 NOTE — Telephone Encounter (Signed)
Patient states she has a MRI scheduled on 09/09/22 at the imaging center on [facility], states she is claustrophobic and she is usually given a mild sedative to take, she was wondering if a prescription could be sent in for her so she can do the MRI.

## 2022-08-22 ENCOUNTER — Other Ambulatory Visit (HOSPITAL_COMMUNITY): Payer: Self-pay

## 2022-08-22 ENCOUNTER — Other Ambulatory Visit: Payer: Self-pay | Admitting: Family Medicine

## 2022-08-22 MED ORDER — ALPRAZOLAM 0.5 MG PO TABS
0.5000 mg | ORAL_TABLET | Freq: Once | ORAL | 0 refills | Status: AC
  Filled 2022-08-22: qty 1, 1d supply, fill #0

## 2022-08-23 NOTE — Telephone Encounter (Signed)
Called pt and relayed rx was sent. Pt reports that since she was prescribed headache medicine her headaches have been gone completely and wants to know if vickie thinks it is still beneficial to have MRI done or if she should cancel?  Pt also reports that she can't raise left arm past her head, thinks it may be arthritis acting up in her joints and is wondering if you recommend anything for this?

## 2022-08-23 NOTE — Telephone Encounter (Signed)
ATC again and was given message "call cannot be completed at this time"

## 2022-08-23 NOTE — Telephone Encounter (Signed)
ATC but line was busy, will try again later

## 2022-08-24 NOTE — Telephone Encounter (Signed)
ATC but line was busy

## 2022-08-24 NOTE — Telephone Encounter (Signed)
ATC but "call cannot be completed at this time"

## 2022-08-25 NOTE — Telephone Encounter (Signed)
ATC but "call cannot be completed at this time"

## 2022-08-28 ENCOUNTER — Other Ambulatory Visit (HOSPITAL_COMMUNITY): Payer: Self-pay

## 2022-08-28 NOTE — Telephone Encounter (Signed)
ATC- "call cannot be completed at this time"

## 2022-08-29 ENCOUNTER — Other Ambulatory Visit: Payer: Self-pay

## 2022-08-31 ENCOUNTER — Other Ambulatory Visit: Payer: Self-pay

## 2022-09-05 ENCOUNTER — Emergency Department (HOSPITAL_COMMUNITY): Payer: BC Managed Care – PPO

## 2022-09-05 ENCOUNTER — Emergency Department (HOSPITAL_COMMUNITY)
Admission: EM | Admit: 2022-09-05 | Discharge: 2022-09-05 | Disposition: A | Discharge status: Home / Self Care | Payer: BC Managed Care – PPO | Attending: Emergency Medicine | Admitting: Emergency Medicine

## 2022-09-05 DIAGNOSIS — Z79899 Other long term (current) drug therapy: Secondary | ICD-10-CM | POA: Diagnosis not present

## 2022-09-05 DIAGNOSIS — R5381 Other malaise: Secondary | ICD-10-CM | POA: Diagnosis not present

## 2022-09-05 DIAGNOSIS — I10 Essential (primary) hypertension: Secondary | ICD-10-CM | POA: Diagnosis not present

## 2022-09-05 DIAGNOSIS — Z8616 Personal history of COVID-19: Secondary | ICD-10-CM | POA: Diagnosis not present

## 2022-09-05 DIAGNOSIS — D509 Iron deficiency anemia, unspecified: Secondary | ICD-10-CM | POA: Insufficient documentation

## 2022-09-05 DIAGNOSIS — Z91040 Latex allergy status: Secondary | ICD-10-CM | POA: Insufficient documentation

## 2022-09-05 DIAGNOSIS — R6883 Chills (without fever): Secondary | ICD-10-CM | POA: Insufficient documentation

## 2022-09-05 DIAGNOSIS — R42 Dizziness and giddiness: Secondary | ICD-10-CM | POA: Insufficient documentation

## 2022-09-05 DIAGNOSIS — Z1152 Encounter for screening for COVID-19: Secondary | ICD-10-CM | POA: Diagnosis not present

## 2022-09-05 DIAGNOSIS — R040 Epistaxis: Secondary | ICD-10-CM | POA: Diagnosis not present

## 2022-09-05 DIAGNOSIS — E876 Hypokalemia: Secondary | ICD-10-CM | POA: Diagnosis not present

## 2022-09-05 DIAGNOSIS — R519 Headache, unspecified: Secondary | ICD-10-CM | POA: Diagnosis not present

## 2022-09-05 LAB — COMPREHENSIVE METABOLIC PANEL
ALT: 67 U/L — ABNORMAL HIGH (ref 0–44)
AST: 59 U/L — ABNORMAL HIGH (ref 15–41)
Albumin: 4.3 g/dL (ref 3.5–5.0)
Alkaline Phosphatase: 65 U/L (ref 38–126)
Anion gap: 13 (ref 5–15)
BUN: 12 mg/dL (ref 6–20)
CO2: 26 mmol/L (ref 22–32)
Calcium: 8.7 mg/dL — ABNORMAL LOW (ref 8.9–10.3)
Chloride: 101 mmol/L (ref 98–111)
Creatinine, Ser: 0.61 mg/dL (ref 0.44–1.00)
GFR, Estimated: >60 mL/min (ref >60)
Glucose, Bld: 95 mg/dL (ref 70–99)
Potassium: 3.4 mmol/L — ABNORMAL LOW (ref 3.5–5.1)
Sodium: 140 mmol/L (ref 135–145)
Total Bilirubin: 0.5 mg/dL (ref 0.3–1.2)
Total Protein: 7.7 g/dL (ref 6.5–8.1)

## 2022-09-05 LAB — CBC
HCT: 34.9 % — ABNORMAL LOW (ref 36.0–46.0)
Hemoglobin: 11.1 g/dL — ABNORMAL LOW (ref 12.0–15.0)
MCH: 22.8 pg — ABNORMAL LOW (ref 26.0–34.0)
MCHC: 31.8 g/dL (ref 30.0–36.0)
MCV: 71.8 fL — ABNORMAL LOW (ref 80.0–100.0)
Platelets: 257 10*3/uL (ref 150–400)
RBC: 4.86 MIL/uL (ref 3.87–5.11)
RDW: 15.4 % (ref 11.5–15.5)
WBC: 5.4 10*3/uL (ref 4.0–10.5)
nRBC: 0 % (ref 0.0–0.2)

## 2022-09-05 LAB — RESP PANEL BY RT-PCR (RSV, FLU A&B, COVID)  RVPGX2
Influenza A by PCR: NEGATIVE
Influenza B by PCR: NEGATIVE
Resp Syncytial Virus by PCR: NEGATIVE
SARS Coronavirus 2 by RT PCR: NEGATIVE

## 2022-09-05 LAB — GROUP A STREP BY PCR: Group A Strep by PCR: NOT DETECTED

## 2022-09-05 MED ORDER — LACTATED RINGERS IV BOLUS
1000.0000 mL | Freq: Once | INTRAVENOUS | Status: AC
  Administered 2022-09-05: 1000 mL via INTRAVENOUS

## 2022-09-05 MED ORDER — POTASSIUM CHLORIDE CRYS ER 20 MEQ PO TBCR
40.0000 meq | EXTENDED_RELEASE_TABLET | Freq: Once | ORAL | Status: AC
  Administered 2022-09-05: 40 meq via ORAL
  Filled 2022-09-05: qty 2

## 2022-09-05 NOTE — ED Triage Notes (Signed)
Pt reports that she was at work and started feeling dizzy and her nose started bleeding. She also reports chills and malaise today. States that she has had a headache for 2-3 weeks that she has an MRI ordered for next weekend. She states that that pain is no worse than it has been, but the dizziness, chills and nose bleeds are new.

## 2022-09-05 NOTE — Discharge Instructions (Addendum)
While in the emergency department we did lab work to assess your blood counts, electrolytes/kidney/liver function, and did swabs to assess for flu/COVID/strep. Your flu/COVID test are still pending. You can check MyChart for the results. You tested negative for strep. Your chest x-ray was normal. Your results were all reassuring and stable.   We are glad that you are feeling better.  Please return if you have "the worst headache of your life", chest pain, slurred speech, one-sided weakness, uncontrolled nose bleeds.   Schedule an appointment to follow up with your Primary Care Physician. Go to your scheduled MRI next weekend.   If you have a recurrent nose bleed, apply pressure to your nose and tilt your head forward.

## 2022-09-05 NOTE — ED Provider Notes (Signed)
[place] EMERGENCY DEPARTMENT AT [facility] Provider Note   CSN: [id number] Arrival date & time: 09/05/22  [id number]     History  Chief Complaint  Patient presents with   Chills   Epistaxis   Dizziness    [name] is a 62 y.o. female with PMHx of HTN, HLD, GERD, post-COVID chronic fatigue, post-COVID chronic headaches, who presents to the ED today for evaluation of acute onset epistaxis this morning, chills.  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  She reports that ever since she got COVID in October, it has been "downhill ever since".  She feels that she her immune system is down and she is susceptible to several illnesses.  She reports that someone at school vomited yesterday and she "got a hold of it".  She started to have chills this morning.  Last week she had a sore throat which is since resolved since taking Chloraseptic spray.  Additionally, she had a headache this morning that resolved with Tylenol.  She has had chronic intermittent headaches for which her primary care physician has ordered an MRI, scheduled for next Saturday.  She reports feeling "a little off balance" this morning but denies any falls, head trauma, dizziness.  Her episode of epistaxis this lasted less than a couple minutes, resolved when her coworker put a cold rag on her head and she tilts her head back.  Denies any fever, abdominal pain, nausea, vomiting, diarrhea, runny nose, dizziness, vision changes, weakness, slurred speech. Not on blood thinners.  She reports that she feels back to her baseline and symptoms have resolved at this time.        Home Medications Prior to Admission medications   Medication Sig Start Date End Date Taking? Authorizing Provider  amitriptyline (ELAVIL) 10 MG tablet Take 1 tablet (10 mg total) by mouth at bedtime. For headaches  suppression 08/17/22   Panosh, Standley Brooking, MD  amLODipine (NORVASC) 5 MG tablet Take 1 tablet (5 mg total) by mouth daily.  07/03/22   Henson, Vickie L, NP-C  Cholecalciferol (VITAMIN D3) 50 MCG (2000 UT) capsule Take 1 capsule (2,000 Units total) by mouth daily. 05/16/22   Plotnikov, Evie Lacks, MD  diclofenac (VOLTAREN) 75 MG EC tablet Take 1 tablet (75 mg total) by mouth 2 (two) times daily. 08/04/22   Wallene Huh, DPM  diclofenac Sodium (VOLTAREN) 1 % GEL Apply 2 g topically 4 (four) times daily. 03/15/22   Henson, Vickie L, NP-C  fluticasone (FLONASE) 50 MCG/ACT nasal spray Place 2 sprays into both nostrils daily. 11/02/21   Henson, Vickie L, NP-C  folic acid (FOLVITE) 1 MG tablet Take 1 tablet (1 mg total) by mouth daily. 06/05/22   Heilingoetter, Cassandra L, PA-C  lipase/protease/amylase (CREON) 36000 UNITS CPEP capsule Take 3 capsules (108,000 Units total) by mouth 3 (three) times daily with meals AND 2 capsules (72,000 Units total) with snacks. 09/26/21   Irene Shipper, MD  pantoprazole (PROTONIX) 40 MG tablet Take 1 tablet (40 mg total) by mouth 2 (two) times daily. Take 30 minutes before breakfast. 07/17/22   Noralyn Pick, NP  potassium chloride (KLOR-CON M) 10 MEQ tablet Take 1 tablet (10 mEq total) by mouth daily. 08/03/22   Henson, Vickie L, NP-C      Allergies    Aspirin, Hydromorphone hcl, Crestor [rosuvastatin], Lisinopril, and Latex    Review of Systems   Review of Systems  Constitutional:  Positive for chills. Negative for fever.  HENT:  Positive for nosebleeds. Negative for rhinorrhea.   Eyes:  Negative for visual disturbance.  Respiratory:  Negative for shortness of breath.   Cardiovascular:  Negative for chest pain.  Gastrointestinal:  Negative for abdominal pain, constipation, diarrhea, nausea and vomiting.  Neurological:  Negative for dizziness and weakness.    Physical Exam Updated Vital Signs BP (!) 158/97   Pulse 99   Temp 98.6 F (37 C)   Resp 19   Ht '4\' 11"'$  (1.499 m)   Wt 63 kg   LMP  (LMP Unknown)   SpO2 100%   BMI 28.07 kg/m  Physical Exam Constitutional:       General: She is not in acute distress.    Appearance: Normal appearance. She is not ill-appearing.  HENT:     Head: Normocephalic.     Nose:     Comments: Right nare with dried blood anteriorly     Mouth/Throat:     Mouth: Mucous membranes are dry.     Pharynx: Oropharynx is clear. No oropharyngeal exudate or posterior oropharyngeal erythema.  Eyes:     Extraocular Movements: Extraocular movements intact.     Conjunctiva/sclera: Conjunctivae normal.     Pupils: Pupils are equal, round, and reactive to light.     Comments: +exophthalmos b/l  Cardiovascular:     Rate and Rhythm: Normal rate and regular rhythm.     Pulses: Normal pulses.     Heart sounds: Normal heart sounds. No murmur heard. Pulmonary:     Effort: Pulmonary effort is normal.     Breath sounds: Normal breath sounds. No wheezing or rales.  Abdominal:     General: Bowel sounds are normal. There is no distension.     Palpations: Abdomen is soft.     Tenderness: There is no abdominal tenderness. There is no left CVA tenderness.  Musculoskeletal:        General: Normal range of motion.     Cervical back: Neck supple.  Skin:    General: Skin is warm and dry.     Capillary Refill: Capillary refill takes less than 2 seconds.  Neurological:     General: No focal deficit present.     Mental Status: She is alert and oriented to person, place, and time. Mental status is at baseline.     Cranial Nerves: No cranial nerve deficit.     Sensory: No sensory deficit.     Motor: No weakness.     Coordination: Coordination normal.  Psychiatric:        Mood and Affect: Mood normal.        Behavior: Behavior normal.     ED Results / Procedures / Treatments   Labs (all labs ordered are listed, but only abnormal results are displayed) Labs Reviewed - No data to display  EKG None  Radiology No results found.  Procedures Procedures    Medications Ordered in ED Medications - No data to display  ED Course/ Medical  Decision Making/ A&P                             Medical Decision Making 8:12 AM this is a 62 year old female with history of chronic sequela since her COVID infection in October who presents to the ED this morning for an episode of epistaxis that is since resolved and chills.  Examination notable for exophthalmos and dry mouth, otherwise benign.  She describes resolution of symptoms at time  of evaluation. Low suspicion for acute intracranial pathology such as CVA, bleed given no head trauma and normal neurological examination.  Do not feel that patient needs emergent MRI today, she has MRI scheduled for next weekend.  Will test for COVID/flu, strep and do basic lab work. Will give fluids. Anticipate d/c home.   8:33 AM CBC returned- stable microcytic anemia. Per GI note 07/2021 EGD and colonoscopy 2021 unrevealing to work this up.  Appears microcytic anemia has persisted post hysterectomy. Recommended for repeat surveillance colonoscopy 10/2024.   8:38 AM CXR without acute process.   8:49 AM CMP shows mild hypokalemia 3.4, will replete with 40 mEq. Elevated LFTs which appear stable from 1 month ago. Followed by GI for this. Awaiting respiratory panel swab.   9:35 AM in to reassess patient. She had headphones in and was resting comfortably in bed. Updated on lab work. Only thing pending is respiratory panel, she would like to go prior to this resulting. She feels reassured that her other labs were stable. She feels back to her baseline. Had about 1/2 of her LR bolus left but she does not want to stay to complete this, feels ready for d/c home. Requests work note. Called lab and they report there was an error with respiratory panel and it will be another hour before it results. Pt stable for d/c.   Amount and/or Complexity of Data Reviewed External Data Reviewed: labs and notes. Labs: ordered. Radiology: ordered.  Risk Prescription drug management.         Final Clinical Impression(s) / ED  Diagnoses Final diagnoses:  None    Rx / DC Orders ED Discharge Orders     None         Sharion Settler, DO 09/05/22 [id number]    Isla Pence, MD 09/05/22 [id number]

## 2022-09-06 ENCOUNTER — Telehealth: Payer: Self-pay

## 2022-09-06 NOTE — Transitions of Care (Post Inpatient/ED Visit) (Signed)
   09/06/2022  Name: [name] MRN: [id number] DOB: [birth year]  Today's TOC FU Call Status: Today's TOC FU Call Status:: Successful TOC FU Call Competed TOC FU Call Complete Date: 09/06/22  Transition Care Management Follow-up Telephone Call Date of Discharge: 09/05/22 Discharge Facility: [facility]) Type of Discharge: Emergency Department Reason for ED Visit: Other: (chills) How have you been since you were released from the hospital?: Same Any questions or concerns?: No  Items Reviewed: Did you receive and understand the discharge instructions provided?: Yes Medications obtained and verified?: Yes (Medications Reviewed) Any new allergies since your discharge?: No Dietary orders reviewed?: Yes Do you have support at home?: No  Home Care and Equipment/Supplies: [place] Ordered?: NA Any new equipment or medical supplies ordered?: NA  Functional Questionnaire: Do you need assistance with bathing/showering or dressing?: No Do you need assistance with meal preparation?: No Do you need assistance with eating?: No Do you have difficulty maintaining continence: No Do you need assistance with getting out of bed/getting out of a chair/moving?: No Do you have difficulty managing or taking your medications?: No  Folllow up appointments reviewed: PCP Follow-up appointment confirmed?: Yes Date of PCP follow-up appointment?: 09/07/22 Follow-up Provider: [facility] Follow-up appointment confirmed?: NA Do you need transportation to your follow-up appointment?: No Do you understand care options if your condition(s) worsen?: Yes-patient verbalized understanding    Fords, Lucas Direct Dial [phone]

## 2022-09-07 ENCOUNTER — Telehealth: Payer: Self-pay | Admitting: Family Medicine

## 2022-09-07 ENCOUNTER — Ambulatory Visit (INDEPENDENT_AMBULATORY_CARE_PROVIDER_SITE_OTHER): Payer: 59 | Admitting: Family Medicine

## 2022-09-07 ENCOUNTER — Encounter: Payer: Self-pay | Admitting: Family Medicine

## 2022-09-07 VITALS — BP 132/86 | HR 97 | Temp 97.6°F | Ht 59.0 in | Wt 130.0 lb

## 2022-09-07 DIAGNOSIS — R6883 Chills (without fever): Secondary | ICD-10-CM

## 2022-09-07 DIAGNOSIS — J101 Influenza due to other identified influenza virus with other respiratory manifestations: Secondary | ICD-10-CM

## 2022-09-07 NOTE — Telephone Encounter (Signed)
[name] is calling and MR BRAIN WO CONTRAST  need to have authorization by 09-08-2022 2pm . Pt seen dr Regis Bill on 08-17-2022

## 2022-09-07 NOTE — Patient Instructions (Addendum)
You tested positive for flu A on 09/05/2022. This explains your symptoms.   Stay hydrated. Take Tylenol as needed.   Follow up if you are getting worse.   I will see you back in 2 weeks.   Please call and schedule with Dr. Julien Nordmann, hematologist.  [phone]

## 2022-09-07 NOTE — Telephone Encounter (Signed)
Auth is under each ins in referral tab.

## 2022-09-07 NOTE — Progress Notes (Signed)
Subjective:     Patient ID: [name], female    DOB: 02/22/1961, 62 y.o.   MRN: [id number]  Chief Complaint  Patient presents with   Hospitalization Follow-up    Feeling better, last week at school a few sick kids but testing was all negative. Hemoglobin was just low. No longer having chills    HPI Patient is in today for ED follow up for chills.  She continues to have URI symptoms, fatigue and mild cough.   She was unaware that her flu test in the ED was positive.      Health Maintenance Due  Topic Date Due   MAMMOGRAM  10/22/2021    Past Medical History:  Diagnosis Date   Anemia    Arthritis    "neck, hips" (04/25/2018)   Claustrophobia    Daily headache    GERD (gastroesophageal reflux disease)    Hiatal hernia    Hyperlipidemia    "hx" (04/25/2018)   Hypertension    Plantar fasciitis    hx - left foot   Stroke ([place]) 05/2014   "mini stroke" (04/25/2018)    Past Surgical History:  Procedure Laterality Date   ABDOMINAL HYSTERECTOMY  12/09   Secondary to fibroids   ARTERY BIOPSY Right 04/10/2018   Procedure: BIOPSY TEMPORAL ARTERY;  Surgeon: Rosetta Posner, MD;  Location: [place];  Service: Vascular;  Laterality: Right;   [place]; 1988   COLONOSCOPY     FOOT SURGERY Left 03/2017   Bone Spurs Removed   LUMBAR LAMINECTOMY/DECOMPRESSION MICRODISCECTOMY N/A 04/13/2021   Procedure: LUMBAR FOUR- LUMBAR FIVE AND LUMBAR FIVE-SACRAL ONE  DECOMPRESSION;  Surgeon: Marybelle Killings, MD;  Location: [place];  Service: Orthopedics;  Laterality: N/A;   RADIOLOGY WITH ANESTHESIA N/A 04/26/2018   Procedure: MRI WITH ANESTHESIA;  Surgeon: Radiologist, Medication, MD;  Location: [place];  Service: Radiology;  Laterality: N/A;   RADIOLOGY WITH ANESTHESIA N/A 08/24/2019   Procedure: MRI WITH ANESTHESIA CERVICAL SPINE;  Surgeon: Radiologist, Medication, MD;  Location: [place];  Service: Radiology;  Laterality: N/A;   RADIOLOGY WITH ANESTHESIA N/A 10/02/2019   Procedure:  MRI WITH ANESTHESIA CERVICAL WITHOUT CONTRAST;  Surgeon: Radiologist, Medication, MD;  Location: [place];  Service: Radiology;  Laterality: N/A;   TUBAL LIGATION  1988    Family History  Problem Relation Age of Onset   Cancer Mother 37       pancreatic cancer   Pancreatic cancer Mother    Heart disease Mother    Cancer Father 95       throat cancer   Esophageal cancer Father    Cancer Maternal Aunt 64       breast cancer   Cancer Other        Died  from Colon cancer in 60's   Colon cancer Maternal Uncle    Colon cancer Paternal Uncle    Heart disease Sister    Rectal cancer Neg Hx    Stomach cancer Neg Hx     Social History   Socioeconomic History   Marital status: Divorced    Spouse name: Not on file   Number of children: Not on file   Years of education: Not on file   Highest education level: Not on file  Occupational History   Occupation: unemployed    Comment: used to work in a [employer]  Tobacco Use   Smoking status: Some Days    Packs/day: 0.25    Years: 38.00  Additional pack years: 0.00    Total pack years: 9.50    Types: Cigarettes   Smokeless tobacco: Never   Tobacco comments:    1 cigs/day 08/17/2022 km,cma  Vaping Use   Vaping Use: Never used  Substance and Sexual Activity   Alcohol use: Yes    Alcohol/week: 4.0 standard drinks of alcohol    Types: 4 Cans of beer per week   Drug use: Not Currently   Sexual activity: Not Currently    Birth control/protection: Surgical    Comment: Hysterectomy  Other Topics Concern   Not on file  Social History Narrative   10 th grade education.   Social Determinants of Health   Financial Resource Strain: Not on file  Food Insecurity: Not on file  Transportation Needs: No Transportation Needs (08/27/2019)   PRAPARE - Hydrologist (Medical): No    Lack of Transportation (Non-Medical): No  Physical Activity: Not on file  Stress: Not on file  Social Connections: Not on file   Intimate Partner Violence: Not on file    Outpatient Medications Prior to Visit  Medication Sig Dispense Refill   amitriptyline (ELAVIL) 10 MG tablet Take 1 tablet (10 mg total) by mouth at bedtime. For headaches  suppression 30 tablet 0   amLODipine (NORVASC) 5 MG tablet Take 1 tablet (5 mg total) by mouth daily. 90 tablet 0   Cholecalciferol (VITAMIN D3) 50 MCG (2000 UT) capsule Take 1 capsule (2,000 Units total) by mouth daily. 100 capsule 3   fluticasone (FLONASE) 50 MCG/ACT nasal spray Place 2 sprays into both nostrils daily. 16 g 1   folic acid (FOLVITE) 1 MG tablet Take 1 tablet (1 mg total) by mouth daily. 30 tablet 2   lipase/protease/amylase (CREON) 36000 UNITS CPEP capsule Take 3 capsules (108,000 Units total) by mouth 3 (three) times daily with meals AND 2 capsules (72,000 Units total) with snacks. 330 capsule 6   pantoprazole (PROTONIX) 40 MG tablet Take 1 tablet (40 mg total) by mouth 2 (two) times daily. Take 30 minutes before breakfast. 60 tablet 1   potassium chloride (KLOR-CON M) 10 MEQ tablet Take 1 tablet (10 mEq total) by mouth daily. 30 tablet 1   rosuvastatin (CRESTOR) 20 MG tablet Take by mouth.     diclofenac Sodium (VOLTAREN) 1 % GEL Apply 2 g topically 4 (four) times daily. (Patient not taking: Reported on 09/07/2022) 200 g 1   diclofenac (VOLTAREN) 75 MG EC tablet Take 1 tablet (75 mg total) by mouth 2 (two) times daily. (Patient not taking: Reported on 09/07/2022) 50 tablet 2   No facility-administered medications prior to visit.    Allergies  Allergen Reactions   Aspirin Hives   Hydromorphone Hcl Hives and Nausea And Vomiting    Can take Norco   Crestor [Rosuvastatin]     myalgias   Lisinopril     cough   Latex Rash    Review of Systems  Constitutional:  Positive for chills and malaise/fatigue. Negative for fever.  Respiratory:  Positive for cough. Negative for shortness of breath.   Cardiovascular:  Negative for chest pain, palpitations and leg  swelling.  Gastrointestinal:  Negative for abdominal pain, constipation, diarrhea, nausea and vomiting.  Genitourinary:  Negative for dysuria, frequency and urgency.  Musculoskeletal:  Positive for myalgias.  Neurological:  Negative for dizziness.       Objective:    Physical Exam Constitutional:      General: She is not in  acute distress.    Appearance: She is not ill-appearing.  Eyes:     Conjunctiva/sclera: Conjunctivae normal.  Cardiovascular:     Rate and Rhythm: Normal rate.  Pulmonary:     Effort: Pulmonary effort is normal.  Musculoskeletal:     Cervical back: Normal range of motion and neck supple.  Skin:    General: Skin is warm and dry.  Neurological:     General: No focal deficit present.     Mental Status: She is alert and oriented to person, place, and time.  Psychiatric:        Mood and Affect: Mood normal.        Behavior: Behavior normal.        Thought Content: Thought content normal.     BP 132/86 (BP Location: Left Arm, Patient Position: Sitting, Cuff Size: Large)   Pulse 97   Temp 97.6 F (36.4 C) (Temporal)   Ht '4\' 11"'$  (1.499 m)   Wt 130 lb (59 kg)   LMP  (LMP Unknown)   SpO2 97%   BMI 26.26 kg/m  Wt Readings from Last 3 Encounters:  09/07/22 130 lb (59 kg)  09/05/22 139 lb (63 kg)  08/17/22 135 lb 6.4 oz (61.4 kg)        Assessment & Plan:   Problem List Items Addressed This Visit   None Visit Diagnoses     Influenza A    -  Primary   Chills          Reviewed ED note and results and informed her that she tested positive for flu A. She was unaware. Outside of the window for Tamiflu. Discussed symptomatic management. Follow up if worsening or in 2 weeks for chronic health conditions.   I have discontinued Dunia P. Covello's diclofenac. I am also having her maintain her lipase/protease/amylase, fluticasone, diclofenac Sodium, Vitamin D3, folic acid, amLODipine, pantoprazole, potassium chloride, amitriptyline, and  rosuvastatin.  No orders of the defined types were placed in this encounter.

## 2022-09-09 ENCOUNTER — Other Ambulatory Visit: Payer: 59

## 2022-09-11 ENCOUNTER — Other Ambulatory Visit: Payer: Self-pay | Admitting: Family Medicine

## 2022-09-11 ENCOUNTER — Other Ambulatory Visit (HOSPITAL_COMMUNITY): Payer: Self-pay

## 2022-09-11 MED ORDER — AMITRIPTYLINE HCL 10 MG PO TABS
10.0000 mg | ORAL_TABLET | Freq: Every day | ORAL | 0 refills | Status: DC
  Filled 2022-09-11: qty 30, 30d supply, fill #0

## 2022-09-11 NOTE — Telephone Encounter (Signed)
Would you like to continue this medication for pt? Was initially prescribed by Dr. Regis Bill

## 2022-09-12 ENCOUNTER — Ambulatory Visit (INDEPENDENT_AMBULATORY_CARE_PROVIDER_SITE_OTHER): Payer: 59

## 2022-09-12 ENCOUNTER — Other Ambulatory Visit (HOSPITAL_COMMUNITY): Payer: Self-pay

## 2022-09-12 DIAGNOSIS — M722 Plantar fascial fibromatosis: Secondary | ICD-10-CM

## 2022-09-12 NOTE — Progress Notes (Signed)
Patient presents today to pick up custom molded foot orthotics recommended by Dr. Paulla Dolly.   Orthotics were dispensed and fit was satisfactory. Reviewed instructions for break-in and wear. Written instructions given to patient.  Patient will follow up as needed.   [facility] - order # [id number]

## 2022-09-21 ENCOUNTER — Other Ambulatory Visit (HOSPITAL_COMMUNITY): Payer: Self-pay

## 2022-09-21 ENCOUNTER — Other Ambulatory Visit: Payer: Self-pay | Admitting: Nurse Practitioner

## 2022-09-21 MED ORDER — PANTOPRAZOLE SODIUM 40 MG PO TBEC
40.0000 mg | DELAYED_RELEASE_TABLET | Freq: Two times a day (BID) | ORAL | 1 refills | Status: DC
  Filled 2022-09-21: qty 60, 30d supply, fill #0
  Filled 2022-10-23: qty 60, 30d supply, fill #1

## 2022-09-21 NOTE — Telephone Encounter (Signed)
Please advise 

## 2022-09-27 ENCOUNTER — Ambulatory Visit: Payer: 59 | Admitting: Family Medicine

## 2022-09-30 ENCOUNTER — Other Ambulatory Visit: Payer: 59

## 2022-10-03 ENCOUNTER — Other Ambulatory Visit: Payer: Self-pay | Admitting: Family Medicine

## 2022-10-03 ENCOUNTER — Other Ambulatory Visit (HOSPITAL_COMMUNITY): Payer: Self-pay

## 2022-10-03 ENCOUNTER — Other Ambulatory Visit: Payer: Self-pay

## 2022-10-03 DIAGNOSIS — I10 Essential (primary) hypertension: Secondary | ICD-10-CM

## 2022-10-03 MED ORDER — POTASSIUM CHLORIDE CRYS ER 10 MEQ PO TBCR
10.0000 meq | EXTENDED_RELEASE_TABLET | Freq: Every day | ORAL | 0 refills | Status: DC
  Filled 2022-10-03 – 2022-10-23 (×2): qty 30, 30d supply, fill #0

## 2022-10-05 ENCOUNTER — Other Ambulatory Visit (HOSPITAL_COMMUNITY): Payer: Self-pay

## 2022-10-05 ENCOUNTER — Ambulatory Visit: Payer: BC Managed Care – PPO | Admitting: Internal Medicine

## 2022-10-05 ENCOUNTER — Encounter: Payer: Self-pay | Admitting: Internal Medicine

## 2022-10-05 VITALS — BP 128/86 | HR 84 | Ht <= 58 in | Wt 132.2 lb

## 2022-10-05 DIAGNOSIS — K529 Noninfective gastroenteritis and colitis, unspecified: Secondary | ICD-10-CM

## 2022-10-05 DIAGNOSIS — K219 Gastro-esophageal reflux disease without esophagitis: Secondary | ICD-10-CM | POA: Diagnosis not present

## 2022-10-05 DIAGNOSIS — R7989 Other specified abnormal findings of blood chemistry: Secondary | ICD-10-CM

## 2022-10-05 DIAGNOSIS — K8689 Other specified diseases of pancreas: Secondary | ICD-10-CM | POA: Diagnosis not present

## 2022-10-05 DIAGNOSIS — K602 Anal fissure, unspecified: Secondary | ICD-10-CM

## 2022-10-05 MED ORDER — PANCRELIPASE (LIP-PROT-AMYL) 36000-114000 UNITS PO CPEP
ORAL_CAPSULE | ORAL | 11 refills | Status: AC
  Filled 2022-10-05: qty 390, 30d supply, fill #0
  Filled 2023-04-10: qty 300, 23d supply, fill #0
  Filled 2023-04-10: qty 390, 30d supply, fill #0
  Filled 2023-06-04: qty 300, 23d supply, fill #0
  Filled 2023-07-02: qty 400, 27d supply, fill #0
  Filled 2023-07-02: qty 400, 30d supply, fill #0
  Filled 2023-07-26: qty 400, 27d supply, fill #1
  Filled 2023-10-01: qty 400, 27d supply, fill #2

## 2022-10-05 MED ORDER — DILTIAZEM GEL 2 %: CUTANEOUS | 1 refills | Status: DC

## 2022-10-05 NOTE — Progress Notes (Signed)
HISTORY OF PRESENT ILLNESS:  [name] is a 62 y.o. female with past medical history as listed below who presents today for follow-up regarding multiple GI issues.  She was last seen in this office January 11, 2022.  At that time she was evaluated for rectal pain and rectal bleeding secondary to anal fissure, chronic GERD, pancreatic insufficiency managed with Creon, chronic microcytic anemia, and elevated hepatic transaminases.  Reports, she tells me that she continues on pantoprazole for GERD.  She requires 40 mg once daily to control symptoms.  She is extremely happy with the effectiveness of this medication.  She did undergo upper endoscopy 10/16/2021.  The examination was normal except for hiatal hernia.  Obtain 2 medications through her PCP.  Next, she tells me that on Creon, diarrhea.  She recently ran out of Creon few weeks ago and has had recurrent diarrhea.  She requested this medication be refilled.  She has had a bleeding.  She last underwent colonoscopy Nov 11, 2019 and was found to have a sessile serrated polyp.  Follow-up in 7 years recommended.  She has had extensive workup regarding mild elevation of hepatic transaminases no specific etiology identified.  Last set of liver tests September 05, 2022 revealed AST 59, ALT 67, alkaline phosphatase 65, total bilirubin 0.5.  Normal globulins.  No evidence for hepatic synthetic dysfunction or portal hypertension.  Finally, she complains of recurrent fissure.  She requests that a prescription for diltiazem ointment be refilled.  REVIEW OF SYSTEMS:  All non-GI ROS negative unless other stated in the HPI except for anxiety, arthritis, voice change, night sweats  Past Medical History:  Diagnosis Date   Anemia    Arthritis    "neck, hips" (04/25/2018)   Claustrophobia    Daily headache    GERD (gastroesophageal reflux disease)    Hiatal hernia    Hyperlipidemia    "hx" (04/25/2018)   Hypertension    Plantar fasciitis    hx - left  foot   Stroke 05/2014   "mini stroke" (04/25/2018)    Past Surgical History:  Procedure Laterality Date   ABDOMINAL HYSTERECTOMY  12/09   Secondary to fibroids   ARTERY BIOPSY Right 04/10/2018   Procedure: BIOPSY TEMPORAL ARTERY;  Surgeon: Larina Earthly, MD;  Location: [facility];  Service: Vascular;  Laterality: Right;   CESAREAN SECTION  1986; 1988   COLONOSCOPY     FOOT SURGERY Left 03/2017   Bone Spurs Removed   LUMBAR LAMINECTOMY/DECOMPRESSION MICRODISCECTOMY N/A 04/13/2021   Procedure: LUMBAR FOUR- LUMBAR FIVE AND LUMBAR FIVE-SACRAL ONE  DECOMPRESSION;  Surgeon: Eldred Manges, MD;  Location: [facility];  Service: Orthopedics;  Laterality: N/A;   RADIOLOGY WITH ANESTHESIA N/A 04/26/2018   Procedure: MRI WITH ANESTHESIA;  Surgeon: Radiologist, Medication, MD;  Location: [facility];  Service: Radiology;  Laterality: N/A;   RADIOLOGY WITH ANESTHESIA N/A 08/24/2019   Procedure: MRI WITH ANESTHESIA CERVICAL SPINE;  Surgeon: Radiologist, Medication, MD;  Location: [facility];  Service: Radiology;  Laterality: N/A;   RADIOLOGY WITH ANESTHESIA N/A 10/02/2019   Procedure: MRI WITH ANESTHESIA CERVICAL WITHOUT CONTRAST;  Surgeon: Radiologist, Medication, MD;  Location: [facility];  Service: Radiology;  Laterality: N/A;   TUBAL LIGATION  1988    Social History MODENA STEEDLEY  reports that she has been smoking cigarettes. She has a 9.50 pack-year smoking history. She has never used smokeless tobacco. She reports current alcohol use of about 4.0 standard drinks of alcohol per week. She reports that  she does not currently use drugs.  family history includes Cancer in an other family member; Cancer (age of onset: 85) in her maternal aunt; Cancer (age of onset: 78) in her mother; Cancer (age of onset: 75) in her father; Colon cancer in her maternal uncle and paternal uncle; Esophageal cancer in her father; Heart disease in her mother and sister; Pancreatic cancer in her mother.  Allergies  Allergen Reactions   Aspirin  Hives   Hydromorphone Hcl Hives and Nausea And Vomiting    Can take Norco   Crestor [Rosuvastatin]     myalgias   Lisinopril     cough   Latex Rash       PHYSICAL EXAMINATION: Vital signs: BP 128/86 (BP Location: Left Arm, Patient Position: Sitting, Cuff Size: Normal)   Pulse 84   Ht 4\' 10"  (1.473 m)   Wt 132 lb 4 oz (60 kg)   LMP  (LMP Unknown)   BMI 27.64 kg/m   Constitutional: generally well-appearing, no acute distress Psychiatric: alert and oriented x3, cooperative Eyes: extraocular movements intact, anicteric, conjunctiva pink Mouth: oral pharynx moist, no lesions Neck: supple no lymphadenopathy Cardiovascular: heart regular rate and rhythm, no murmur Lungs: clear to auscultation bilaterally Abdomen: soft, nontender, nondistended, no obvious ascites, no peritoneal signs, normal bowel sounds, no organomegaly Rectal: Omitted Extremities: no clubbing, cyanosis, or lower extremity edema bilaterally Skin: no lesions on visible extremities Neuro: No focal deficits.  Cranial nerves intact  ASSESSMENT:  1.  GERD.  Normal previous endoscopy.  Requires pantoprazole 40 mg daily for control symptoms 2.  Pancreatic insufficiency with associated diarrhea and weight loss.  Resolved with the regular use of Creon therapy 3.  Recurrent fissure 4.  History of sessile serrated polyp on colonoscopy 2021 5.  Elevated pending transaminases.  Negative extensive workup.  No evidence of liver dysfunction 6.  Chronic microcytic anemia  PLAN:  1.  Reflux precautions 2.  Continue pantoprazole.  Lowest dose to control symptoms 3.  Refill Creon.  Take 2 with meals and snacks 4.  Refill diltiazem ointment.  Proper way to apply reviewed. 5.  Surveillance colonoscopy around 2028 6.  Routine GI follow-up 1 year.  Ongoing general medical care with A total time of 40 minutes was spent preparing to see the patient, obtaining comprehensive history, performing medically appropriate physical  examination, counseling and educating the patient regarding above listed issues, ordering multiple medications, defining follow-up interval, and documenting clinical information in the health record.

## 2022-10-05 NOTE — Patient Instructions (Addendum)
Please follow up in a year  We have sent the following medications to your pharmacy for you to pick up at your convenience:  We have sent a prescription for Diltiazem 2% gel to [facility] for you. Using your index finger, you should apply a small amount of medication inside the rectum up to your first knuckle/joint twice daily x 4 weeks.  [facility]'s information is below: Address: [address] [place], [place] [address]  [phone]  *Please DO NOT go directly from our office to pick up this medication! Give the pharmacy 1 day to process the prescription as this is compounded and takes time to make.  _______________________________________________________  If your blood pressure at your visit was 140/90 or greater, please contact your primary care physician to follow up on this.  _______________________________________________________  If you are age 61 or older, your body mass index should be between 23-30. Your Body mass index is 27.64 kg/m. If this is out of the aforementioned range listed, please consider follow up with your Primary Care Provider.  If you are age 21 or younger, your body mass index should be between 19-25. Your Body mass index is 27.64 kg/m. If this is out of the aformentioned range listed, please consider follow up with your Primary Care Provider.   ________________________________________________________  The [facility] providers would like to encourage you to use [facility] to communicate with providers for non-urgent requests or questions.  Due to long hold times on the telephone, sending your provider a message by [facility] may be a faster and more efficient way to get a response.  Please allow 48 business hours for a response.  Please remember that this is for non-urgent requests.  _______________________________________________________ It was a pleasure to see you today!  Thank you for trusting me with your gastrointestinal care!

## 2022-10-19 ENCOUNTER — Other Ambulatory Visit (HOSPITAL_COMMUNITY): Payer: Self-pay

## 2022-10-23 ENCOUNTER — Other Ambulatory Visit (HOSPITAL_COMMUNITY): Payer: Self-pay

## 2022-10-23 ENCOUNTER — Other Ambulatory Visit: Payer: Self-pay | Admitting: Physician Assistant

## 2022-10-23 ENCOUNTER — Other Ambulatory Visit: Payer: Self-pay | Admitting: Family Medicine

## 2022-10-23 DIAGNOSIS — E538 Deficiency of other specified B group vitamins: Secondary | ICD-10-CM

## 2022-10-23 DIAGNOSIS — I10 Essential (primary) hypertension: Secondary | ICD-10-CM

## 2022-10-23 MED ORDER — AMLODIPINE BESYLATE 5 MG PO TABS
5.0000 mg | ORAL_TABLET | Freq: Every day | ORAL | 0 refills | Status: DC
  Filled 2022-10-23: qty 90, 90d supply, fill #0

## 2022-10-23 MED ORDER — FOLIC ACID 1 MG PO TABS
1.0000 mg | ORAL_TABLET | Freq: Every day | ORAL | 2 refills | Status: DC
  Filled 2022-10-23: qty 30, 30d supply, fill #0
  Filled 2022-11-21: qty 30, 30d supply, fill #1
  Filled 2022-12-29: qty 30, 30d supply, fill #2

## 2022-10-24 ENCOUNTER — Other Ambulatory Visit (HOSPITAL_COMMUNITY): Payer: Self-pay

## 2022-10-25 ENCOUNTER — Telehealth: Payer: Self-pay | Admitting: Physician Assistant

## 2022-10-25 NOTE — Telephone Encounter (Signed)
Scheduled per 04/29 scheduled message, patient has been called and voicemail was left. 

## 2022-10-27 ENCOUNTER — Telehealth: Payer: Self-pay | Admitting: Physician Assistant

## 2022-10-27 ENCOUNTER — Other Ambulatory Visit (HOSPITAL_COMMUNITY): Payer: Self-pay

## 2022-10-30 ENCOUNTER — Other Ambulatory Visit: Payer: Self-pay | Admitting: Family Medicine

## 2022-10-30 ENCOUNTER — Other Ambulatory Visit (HOSPITAL_COMMUNITY): Payer: Self-pay

## 2022-10-30 DIAGNOSIS — I10 Essential (primary) hypertension: Secondary | ICD-10-CM

## 2022-10-31 ENCOUNTER — Telehealth: Payer: Self-pay | Admitting: Family Medicine

## 2022-10-31 ENCOUNTER — Other Ambulatory Visit (HOSPITAL_COMMUNITY): Payer: Self-pay

## 2022-10-31 NOTE — Telephone Encounter (Signed)
Called [name] and scheduled appt for Thursday, [name] reports she remembers Dr. Macario Golds stopping the medication but can't remember when she really started it back. She thinks at least for the last month but is not sure.

## 2022-10-31 NOTE — Telephone Encounter (Signed)
Pt just ran out of Lisinopril Last Friday.

## 2022-10-31 NOTE — Telephone Encounter (Signed)
Pt called stating her

## 2022-11-02 ENCOUNTER — Encounter: Payer: Self-pay | Admitting: Family Medicine

## 2022-11-02 ENCOUNTER — Ambulatory Visit (INDEPENDENT_AMBULATORY_CARE_PROVIDER_SITE_OTHER): Payer: BC Managed Care – PPO | Admitting: Family Medicine

## 2022-11-02 VITALS — BP 122/84 | HR 94 | Temp 97.6°F | Ht <= 58 in | Wt 134.0 lb

## 2022-11-02 DIAGNOSIS — I10 Essential (primary) hypertension: Secondary | ICD-10-CM

## 2022-11-02 NOTE — Patient Instructions (Signed)
Monitor your blood pressure daily over the next 4 weeks off of lisinopril.   As long as your blood pressure is 130/80 or lower, you can discontinue the lisinopril.   If your blood pressure is not controlled, start back on the lisinopril.

## 2022-11-02 NOTE — Progress Notes (Signed)
Subjective:     Patient ID: [name], female    DOB: 11/04/1960, 62 y.o.   MRN: [id number]  Chief Complaint  Patient presents with   Hypertension    Discuss discontinuing or continuing lisinopril     Hypertension Pertinent negatives include no chest pain, headaches, palpitations or shortness of breath.   Patient is in today for concerns regarding HTN and her medications.   She was off of lisinopril from November until last month to see if her cough resolved and it had not affect on cough. She restarted lisinopril last month and she has not had any worsening cough.   She prefers to see if her BP is in goal off of lisinopril. She has been out of her medication for one week and her BP today is good.   Health Maintenance Due  Topic Date Due   MAMMOGRAM  10/22/2021   COVID-19 Vaccine (4 - 2023-24 season) 02/24/2022    Past Medical History:  Diagnosis Date   Anemia    Arthritis    "neck, hips" (04/25/2018)   Claustrophobia    Daily headache    GERD (gastroesophageal reflux disease)    Hiatal hernia    Hyperlipidemia    "hx" (04/25/2018)   Hypertension    Plantar fasciitis    hx - left foot   Stroke (HCC) 05/2014   "mini stroke" (04/25/2018)    Past Surgical History:  Procedure Laterality Date   ABDOMINAL HYSTERECTOMY  12/09   Secondary to fibroids   ARTERY BIOPSY Right 04/10/2018   Procedure: BIOPSY TEMPORAL ARTERY;  Surgeon: Larina Earthly, MD;  [facility]: [facility];  Service: Vascular;  Laterality: Right;   CESAREAN SECTION  1986; 1988   COLONOSCOPY     FOOT SURGERY Left 03/2017   Bone Spurs Removed   LUMBAR LAMINECTOMY/DECOMPRESSION MICRODISCECTOMY N/A 04/13/2021   Procedure: LUMBAR FOUR- LUMBAR FIVE AND LUMBAR FIVE-SACRAL ONE  DECOMPRESSION;  Surgeon: Eldred Manges, MD;  [facility]: [facility];  Service: Orthopedics;  Laterality: N/A;   RADIOLOGY WITH ANESTHESIA N/A 04/26/2018   Procedure: MRI WITH ANESTHESIA;  Surgeon: Radiologist, Medication, MD;  [facility]: [facility];  Service: Radiology;  Laterality: N/A;   RADIOLOGY WITH ANESTHESIA N/A 08/24/2019   Procedure: MRI WITH ANESTHESIA CERVICAL SPINE;  Surgeon: Radiologist, Medication, MD;  [facility]: [facility];  Service: Radiology;  Laterality: N/A;   RADIOLOGY WITH ANESTHESIA N/A 10/02/2019   Procedure: MRI WITH ANESTHESIA CERVICAL WITHOUT CONTRAST;  Surgeon: Radiologist, Medication, MD;  [facility]: [facility];  Service: Radiology;  Laterality: N/A;   TUBAL LIGATION  1988    Family History  Problem Relation Age of Onset   Cancer Mother 8       pancreatic cancer   Pancreatic cancer Mother    Heart disease Mother    Cancer Father 46       throat cancer   Esophageal cancer Father    Heart disease Sister    Cancer Maternal Aunt 36       breast cancer   Colon cancer Maternal Uncle    Colon cancer Paternal Uncle    Cancer Other        Died  from Colon cancer in 25's   Rectal cancer Neg Hx    Stomach cancer Neg Hx     Social History   Socioeconomic History   Marital status: Divorced    Spouse name: Not on file   Number of children: 2   Years of education: Not on file  Highest education level: Not on file  Occupational History   Occupation: unemployed    Comment: 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[employer], retail  Tobacco Use   Smoking status: Some Days    Packs/day: 0.25    Years: 38.00    Additional pack years: 0.00    Total pack years: 9.50    Types: Cigarettes   Smokeless tobacco: Never   Tobacco comments:    1 cigs/day 08/17/2022 km,cma  Vaping Use   Vaping Use: Never used  Substance and Sexual Activity   Alcohol use: Yes    Alcohol/week: 4.0 standard drinks of alcohol    Types: 4 Cans of beer per week   Drug use: Not Currently   Sexual activity: Not Currently    Birth control/protection: Surgical    Comment: Hysterectomy  Other Topics Concern   Not on file  Social History Narrative   10 th grade education.   Social Determinants of Health   Financial Resource Strain: Not on file  Food Insecurity:  Not on file  Transportation Needs: No Transportation Needs (08/27/2019)   PRAPARE - Administrator, Civil Service (Medical): No    Lack of Transportation (Non-Medical): No  Physical Activity: Not on file  Stress: Not on file  Social Connections: Not on file  Intimate Partner Violence: Not on file    Outpatient Medications Prior to Visit  Medication Sig Dispense Refill   amitriptyline (ELAVIL) 10 MG tablet Take 1 tablet (10 mg total) by mouth at bedtime, for headache suppression 30 tablet 0   amLODipine (NORVASC) 5 MG tablet Take 1 tablet (5 mg total) by mouth daily. 90 tablet 0   Cholecalciferol (VITAMIN D3) 50 MCG (2000 UT) capsule Take 1 capsule (2,000 Units total) by mouth daily. 100 capsule 3   diclofenac Sodium (VOLTAREN) 1 % GEL Apply 2 g topically 4 (four) times daily. 200 g 1   diltiazem 2 % GEL Please apply 5 times daily 30 g 1   fluticasone (FLONASE) 50 MCG/ACT nasal spray Place 2 sprays into both nostrils daily. 16 g 1   folic acid (FOLVITE) 1 MG tablet Take 1 tablet (1 mg total) by mouth daily. 30 tablet 2   lipase/protease/amylase (CREON) 36000 UNITS CPEP capsule Take 3 capsules by mouth 3 times daily with meals AND 2 capsules with snacks 390 capsule 11   MAGNESIUM PO Take 1 tablet by mouth daily.     pantoprazole (PROTONIX) 40 MG tablet Take 1 tablet (40 mg total) by mouth 2 (two) times daily. Take 30 minutes before breakfast. 60 tablet 1   potassium chloride (KLOR-CON M) 10 MEQ tablet Take 1 tablet (10 mEq total) by mouth daily. Please schedule office visit for further refills. 30 tablet 0   rosuvastatin (CRESTOR) 20 MG tablet Take by mouth.     No facility-administered medications prior to visit.    Allergies  Allergen Reactions   Aspirin Hives   Hydromorphone Hcl Hives and Nausea And Vomiting    Can take Norco   Crestor [Rosuvastatin]     myalgias   Lisinopril     cough   Latex Rash    Review of Systems  Constitutional:  Negative for chills and  fever.  Respiratory:  Positive for cough. Negative for shortness of breath.   Cardiovascular:  Negative for chest pain, palpitations and leg swelling.  Gastrointestinal:  Negative for nausea and vomiting.  Neurological:  Negative for dizziness and headaches.       Objective:  Physical Exam Constitutional:      General: She is not in acute distress.    Appearance: She is not ill-appearing.  Eyes:     Extraocular Movements: Extraocular movements intact.     Conjunctiva/sclera: Conjunctivae normal.  Cardiovascular:     Rate and Rhythm: Normal rate.  Pulmonary:     Effort: Pulmonary effort is normal.  Musculoskeletal:     Cervical back: Normal range of motion and neck supple.  Skin:    General: Skin is warm and dry.  Neurological:     General: No focal deficit present.     Mental Status: She is alert and oriented to person, place, and time.  Psychiatric:        Mood and Affect: Mood normal.        Behavior: Behavior normal.        Thought Content: Thought content normal.     BP 122/84 (BP Location: Left Arm, Patient Position: Sitting, Cuff Size: Large)   Pulse 94   Temp 97.6 F (36.4 C) (Temporal)   Ht 4\' 10"  (1.473 m)   Wt 134 lb (60.8 kg)   LMP  (LMP Unknown)   SpO2 97%   BMI 28.01 kg/m  Wt Readings from Last 3 Encounters:  11/02/22 134 lb (60.8 kg)  10/05/22 132 lb 4 oz (60 kg)  09/07/22 130 lb (59 kg)       Assessment & Plan:   Problem List Items Addressed This Visit       Cardiovascular and Mediastinum   Essential hypertension, benign - Primary (Chronic)    She will continue amlodipine 5 mg daily for hypertension.  Hold lisinopril for the next 4 weeks and monitor blood pressure daily.  If her blood pressures are well-controlled, she may discontinue the lisinopril.  If her blood pressures are elevated, she will let me know and restart lisinopril. Cough was unaffected by stopping lisinopril for several months.  I am having [name] maintain  her fluticasone, diclofenac Sodium, Vitamin D3, rosuvastatin, amitriptyline, pantoprazole, potassium chloride, MAGNESIUM PO, lipase/protease/amylase, diltiazem, amLODipine, and folic acid.  No orders of the defined types were placed in this encounter.

## 2022-11-06 ENCOUNTER — Telehealth: Payer: Self-pay | Admitting: Physician Assistant

## 2022-11-09 ENCOUNTER — Telehealth: Payer: Self-pay | Admitting: Internal Medicine

## 2022-11-09 NOTE — Telephone Encounter (Signed)
Pt states she is having a lot of bleeding from her rectal area. She ran out of the cream for her fissure and will not be able to get it refilled until next week due to the cost. She wanted to know if there is anything else Dr. Marina Goodell could recommend until she can get the diltiazem next week. Also reports being nauseated, she has not vomited just felt nauseous. 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 Please advise.

## 2022-11-09 NOTE — Telephone Encounter (Signed)
Patient called requesting to speak with a nurse is feeling nauseated and has a lot of blood coming from her rectum. Seeking advise.

## 2022-11-09 NOTE — Telephone Encounter (Signed)
Pt states she cannot afford another prescription. Samples of recticare left up front for pt to pick up this afternoon to see if that will help.

## 2022-11-09 NOTE — Telephone Encounter (Signed)
Diltiazem is best.  Can try Analpram cream in the interim

## 2022-11-13 ENCOUNTER — Other Ambulatory Visit: Payer: Self-pay | Admitting: Medical Oncology

## 2022-11-13 DIAGNOSIS — D509 Iron deficiency anemia, unspecified: Secondary | ICD-10-CM

## 2022-11-13 NOTE — Progress Notes (Unsigned)
[facility] OFFICE PROGRESS NOTE  Avanell Shackleton, NP-C [address] [place] [address]  DIAGNOSIS: Microcytic anemia with history of iron deficiency   PRIOR THERAPY: None  CURRENT THERAPY: Over-the-counter oral iron tablets in addition to folic acid once daily   INTERVAL HISTORY: [name] 62 y.o. female and I connected via telephone visit. The patient is followed for her history of IDA and folic acid deficiency. She was last seen by Dr. Arbutus Ped on 03/29/23. The patient had extensive work-up performed in the past including iron study and ferritin that were unremarkable. She also had hemoglobin electrophoresis that showed no evidence for basal cell ischemia or sickle cell trait. The only abnormality was found on her previous blood work was low serum folate and she is currently on oral iron tablet as well as folic acid and tolerating it fairly well. Had heavy metals performed in the past that were unremarkable.   I will see her back for follow-up visit in 3 months with repeat CBC, iron study and ferritin in addition to serum copper and ceruloplasmin.   Regarding symptoms, she reports fatigue some days more than others.  She denies any lightheadedness or dyspnea on exertion.  She denies any palpitations.  She denies any vaginal bleeding as she is status post a hysterectomy from 2009 due to fibroids.  Her last colonoscopy was in 2021 which showed one 3 mm polyp and small internal hemorrhoids.  She reports her last episode of rectal bleeding was ***. She denies epistaxis, gingival bleeding, hemoptysis, hematemesis, melena, or hematochezia.  Denies any chills, night sweats, unexplained weight loss, or lymphadenopathy. Denies any NSAID use.  Denies any chronic kidney disease.  She denies any particular dietary habits such as being a vegan or vegetarian.  Denies any history of any bariatric surgery.  She reports she sometimes craves ice chips. She had lab work performed a few  days ago.   MEDICAL HISTORY: Past Medical History:  Diagnosis Date   Anemia    Arthritis    "neck, hips" (04/25/2018)   Claustrophobia    Daily headache    GERD (gastroesophageal reflux disease)    Hiatal hernia    Hyperlipidemia    "hx" (04/25/2018)   Hypertension    Plantar fasciitis    hx - left foot   Stroke (HCC) 05/2014   "mini stroke" (04/25/2018)    ALLERGIES:  is allergic to aspirin, hydromorphone hcl, crestor [rosuvastatin], and latex.  MEDICATIONS:  Current Outpatient Medications  Medication Sig Dispense Refill   amitriptyline (ELAVIL) 10 MG tablet Take 1 tablet (10 mg total) by mouth at bedtime, for headache suppression 30 tablet 0   amLODipine (NORVASC) 5 MG tablet Take 1 tablet (5 mg total) by mouth daily. 90 tablet 0   Cholecalciferol (VITAMIN D3) 50 MCG (2000 UT) capsule Take 1 capsule (2,000 Units total) by mouth daily. 100 capsule 3   diclofenac Sodium (VOLTAREN) 1 % GEL Apply 2 g topically 4 (four) times daily. 200 g 1   diltiazem 2 % GEL Please apply 5 times daily 30 g 1   fluticasone (FLONASE) 50 MCG/ACT nasal spray Place 2 sprays into both nostrils daily. 16 g 1   folic acid (FOLVITE) 1 MG tablet Take 1 tablet (1 mg total) by mouth daily. 30 tablet 2   lipase/protease/amylase (CREON) 36000 UNITS CPEP capsule Take 3 capsules by mouth 3 times daily with meals AND 2 capsules with snacks 390 capsule 11   MAGNESIUM PO Take  1 tablet by mouth daily.     pantoprazole (PROTONIX) 40 MG tablet Take 1 tablet (40 mg total) by mouth 2 (two) times daily. Take 30 minutes before breakfast. 60 tablet 1   potassium chloride (KLOR-CON M) 10 MEQ tablet Take 1 tablet (10 mEq total) by mouth daily. Please schedule office visit for further refills. 30 tablet 0   rosuvastatin (CRESTOR) 20 MG tablet Take by mouth.     No current facility-administered medications for this visit.    SURGICAL HISTORY:  Past Surgical History:  Procedure Laterality Date   ABDOMINAL HYSTERECTOMY   12/09   Secondary to fibroids   ARTERY BIOPSY Right 04/10/2018   Procedure: BIOPSY TEMPORAL ARTERY;  Surgeon: Larina Earthly, MD;  Location[facility] OR;  Service: Vascular;  Laterality: Right;   CESAREAN SECTION  1986; 1988   COLONOSCOPY     FOOT SURGERY Left 03/2017   Bone Spurs Removed   LUMBAR LAMINECTOMY/DECOMPRESSION MICRODISCECTOMY N/A 04/13/2021   Procedure: LUMBAR FOUR- LUMBAR FIVE AND LUMBAR FIVE-SACRAL ONE  DECOMPRESSION;  Surgeon: Eldred Manges, MD;  Location: [facility] OR;  Service: Orthopedics;  Laterality: N/A;   RADIOLOGY WITH ANESTHESIA N/A 04/26/2018   Procedure: MRI WITH ANESTHESIA;  Surgeon: Radiologist, Medication, MD;  Location: [facility] OR;  Service: Radiology;  Laterality: N/A;   RADIOLOGY WITH ANESTHESIA N/A 08/24/2019   Procedure: MRI WITH ANESTHESIA CERVICAL SPINE;  Surgeon: Radiologist, Medication, MD;  Location: [facility] OR;  Service: Radiology;  Laterality: N/A;   RADIOLOGY WITH ANESTHESIA N/A 10/02/2019   Procedure: MRI WITH ANESTHESIA CERVICAL WITHOUT CONTRAST;  Surgeon: Radiologist, Medication, MD;  Location: [facility] OR;  Service: Radiology;  Laterality: N/A;   TUBAL LIGATION  1988    REVIEW OF SYSTEMS:   Review of Systems  Constitutional: Negative for appetite change, chills, fatigue, fever and unexpected weight change.  HENT:   Negative for mouth sores, nosebleeds, sore throat and trouble swallowing.   Eyes: Negative for eye problems and icterus.  Respiratory: Negative for cough, hemoptysis, shortness of breath and wheezing.   Cardiovascular: Negative for chest pain and leg swelling.  Gastrointestinal: Negative for abdominal pain, constipation, diarrhea, nausea and vomiting.  Genitourinary: Negative for bladder incontinence, difficulty urinating, dysuria, frequency and hematuria.   Musculoskeletal: Negative for back pain, gait problem, neck pain and neck stiffness.  Skin: Negative for itching and rash.  Neurological: Negative for dizziness, extremity weakness, gait problem, headaches,  light-headedness and seizures.  Hematological: Negative for adenopathy. Does not bruise/bleed easily.  Psychiatric/Behavioral: Negative for confusion, depression and sleep disturbance. The patient is not nervous/anxious.     PHYSICAL EXAMINATION:  There were no vitals taken for this visit.  ECOG PERFORMANCE STATUS: {CHL ONC ECOG [id number]  Physical Exam  Constitutional: Oriented to person, place, and time and well-developed, well-nourished, and in no distress. No distress.  HENT:  Head: Normocephalic and atraumatic.  Mouth/Throat: Oropharynx is clear and moist. No oropharyngeal exudate.  Eyes: Conjunctivae are normal. Right eye exhibits no discharge. Left eye exhibits no discharge. No scleral icterus.  Neck: Normal range of motion. Neck supple.  Cardiovascular: Normal rate, regular rhythm, normal heart sounds and intact distal pulses.   Pulmonary/Chest: Effort normal and breath sounds normal. No respiratory distress. No wheezes. No rales.  Abdominal: Soft. Bowel sounds are normal. Exhibits no distension and no mass. There is no tenderness.  Musculoskeletal: Normal range of motion. Exhibits no edema.  Lymphadenopathy:    No cervical adenopathy.  Neurological: Alert and oriented to person, place, and time. Exhibits normal  muscle tone. Gait normal. Coordination normal.  Skin: Skin is warm and dry. No rash noted. Not diaphoretic. No erythema. No pallor.  Psychiatric: Mood, memory and judgment normal.  Vitals reviewed.  LABORATORY DATA: Lab Results  Component Value Date   WBC 5.4 09/05/2022   HGB 11.1 (L) 09/05/2022   HCT 34.9 (L) 09/05/2022   MCV 71.8 (L) 09/05/2022   PLT 257 09/05/2022      Chemistry      Component Value Date/Time   NA 140 09/05/2022 0808   NA 143 02/18/2020 1429   K 3.4 (L) 09/05/2022 0808   CL 101 09/05/2022 0808   CO2 26 09/05/2022 0808   BUN 12 09/05/2022 0808   BUN 13 02/18/2020 1429   CREATININE 0.61 09/05/2022 0808   CREATININE 0.68  03/28/2022 1045   CREATININE 0.60 04/23/2014 0910      Component Value Date/Time   CALCIUM 8.7 (L) 09/05/2022 0808   ALKPHOS 65 09/05/2022 0808   AST 59 (H) 09/05/2022 0808   AST 42 (H) 03/28/2022 1045   ALT 67 (H) 09/05/2022 0808   ALT 50 (H) 03/28/2022 1045   BILITOT 0.5 09/05/2022 0808   BILITOT 0.4 03/28/2022 1045       RADIOGRAPHIC STUDIES:  No results found.   ASSESSMENT/PLAN:  This is a very pleasant 62 year old African-American female with history of microcytic anemia of unclear etiology. The patient had extensive work-up performed in the past including iron study and ferritin that were unremarkable. She also had hemoglobin electrophoresis that showed no evidence for basal cell ischemia or sickle cell trait. The only abnormality was found on her previous blood work was low serum folate and she is currently on oral iron tablet as well as folic acid and tolerating it fairly well.   Repeat CBC from a few days ago showed persistent anemia with hemoglobin of *** and hematocrit of *** and MCV of ***.   Reviewed her labs with Dr. Arbutus Ped.  Recommended for the patient to continue with the oral iron and folic acid supplements as planned. I will see her back for follow-up visit in 3 months with repeat CBC, iron study, folate, and ferritin   The patient was advised to call immediately if she has any concerning symptoms in the interval. The patient voices understanding of current disease status and treatment options and is in agreement with the current care plan. All questions were answered. The patient knows to call the clinic with any problems, questions or concerns. We can certainly see the patient much sooner if necessary    No orders of the defined types were placed in this encounter.    I spent {CHL ONC TIME VISIT - ZOXWR:[id number]} counseling the patient face to face. The total time spent in the appointment was {CHL ONC TIME VISIT - BJYNW:[id number]}.  Pau Banh L  Tyffani Foglesong, PA-C 11/13/22

## 2022-11-14 ENCOUNTER — Other Ambulatory Visit: Payer: BC Managed Care – PPO

## 2022-11-14 ENCOUNTER — Other Ambulatory Visit: Payer: Self-pay

## 2022-11-14 ENCOUNTER — Ambulatory Visit: Payer: BC Managed Care – PPO | Admitting: Physician Assistant

## 2022-11-14 ENCOUNTER — Inpatient Hospital Stay: Payer: BC Managed Care – PPO | Attending: Physician Assistant

## 2022-11-14 DIAGNOSIS — D509 Iron deficiency anemia, unspecified: Secondary | ICD-10-CM | POA: Diagnosis not present

## 2022-11-14 LAB — IRON AND IRON BINDING CAPACITY (CC-WL,HP ONLY)
Iron: 103 ug/dL (ref 28–170)
Saturation Ratios: 27 % (ref 10.4–31.8)
TIBC: 381 ug/dL (ref 250–450)
UIBC: 278 ug/dL (ref 148–442)

## 2022-11-14 LAB — CBC WITH DIFFERENTIAL (CANCER CENTER ONLY)
Abs Immature Granulocytes: 0.01 10*3/uL (ref 0.00–0.07)
Basophils Absolute: 0 10*3/uL (ref 0.0–0.1)
Basophils Relative: 0 %
Eosinophils Absolute: 0.2 10*3/uL (ref 0.0–0.5)
Eosinophils Relative: 4 %
HCT: 33.4 % — ABNORMAL LOW (ref 36.0–46.0)
Hemoglobin: 10.6 g/dL — ABNORMAL LOW (ref 12.0–15.0)
Immature Granulocytes: 0 %
Lymphocytes Relative: 47 %
Lymphs Abs: 2.3 10*3/uL (ref 0.7–4.0)
MCH: 23.2 pg — ABNORMAL LOW (ref 26.0–34.0)
MCHC: 31.7 g/dL (ref 30.0–36.0)
MCV: 73.2 fL — ABNORMAL LOW (ref 80.0–100.0)
Monocytes Absolute: 0.4 10*3/uL (ref 0.1–1.0)
Monocytes Relative: 7 %
Neutro Abs: 2.1 10*3/uL (ref 1.7–7.7)
Neutrophils Relative %: 42 %
Platelet Count: 221 10*3/uL (ref 150–400)
RBC: 4.56 MIL/uL (ref 3.87–5.11)
RDW: 13.6 % (ref 11.5–15.5)
WBC Count: 4.9 10*3/uL (ref 4.0–10.5)
nRBC: 0 % (ref 0.0–0.2)

## 2022-11-14 LAB — CMP (CANCER CENTER ONLY)
ALT: 71 U/L — ABNORMAL HIGH (ref 0–44)
AST: 59 U/L — ABNORMAL HIGH (ref 15–41)
Albumin: 4.4 g/dL (ref 3.5–5.0)
Alkaline Phosphatase: 67 U/L (ref 38–126)
Anion gap: 9 (ref 5–15)
BUN: 12 mg/dL (ref 8–23)
CO2: 28 mmol/L (ref 22–32)
Calcium: 9 mg/dL (ref 8.9–10.3)
Chloride: 105 mmol/L (ref 98–111)
Creatinine: 0.6 mg/dL (ref 0.44–1.00)
GFR, Estimated: >60 mL/min (ref >60)
Glucose, Bld: 103 mg/dL — ABNORMAL HIGH (ref 70–99)
Potassium: 3.6 mmol/L (ref 3.5–5.1)
Sodium: 142 mmol/L (ref 135–145)
Total Bilirubin: 0.4 mg/dL (ref 0.3–1.2)
Total Protein: 7.1 g/dL (ref 6.5–8.1)

## 2022-11-14 LAB — FERRITIN: Ferritin: 104 ng/mL (ref 11–307)

## 2022-11-15 ENCOUNTER — Telehealth: Payer: BC Managed Care – PPO | Admitting: Physician Assistant

## 2022-11-15 LAB — COPPER, SERUM: Copper: 98 ug/dL (ref 80–158)

## 2022-11-16 ENCOUNTER — Inpatient Hospital Stay (HOSPITAL_BASED_OUTPATIENT_CLINIC_OR_DEPARTMENT_OTHER): Payer: BC Managed Care – PPO | Admitting: Physician Assistant

## 2022-11-16 ENCOUNTER — Telehealth: Payer: BC Managed Care – PPO | Admitting: Physician Assistant

## 2022-11-16 DIAGNOSIS — D509 Iron deficiency anemia, unspecified: Secondary | ICD-10-CM

## 2022-11-16 LAB — CERULOPLASMIN: Ceruloplasmin: 23.1 mg/dL (ref 19.0–39.0)

## 2022-11-21 ENCOUNTER — Other Ambulatory Visit: Payer: Self-pay | Admitting: Family Medicine

## 2022-11-21 ENCOUNTER — Other Ambulatory Visit: Payer: Self-pay | Admitting: Nurse Practitioner

## 2022-11-21 ENCOUNTER — Other Ambulatory Visit (HOSPITAL_COMMUNITY): Payer: Self-pay

## 2022-11-21 MED ORDER — PANTOPRAZOLE SODIUM 40 MG PO TBEC
40.0000 mg | DELAYED_RELEASE_TABLET | Freq: Two times a day (BID) | ORAL | 2 refills | Status: DC
  Filled 2022-11-21: qty 60, 30d supply, fill #0
  Filled 2022-12-29: qty 60, 30d supply, fill #1
  Filled 2023-01-30: qty 60, 30d supply, fill #2

## 2022-11-21 NOTE — Telephone Encounter (Signed)
Do we continue this?

## 2022-11-22 NOTE — Telephone Encounter (Signed)
Called pt and she reports she has been taking the potassium everyday since her last visit with you. She confirmed she is not taking fluid pill currently.

## 2022-11-23 ENCOUNTER — Other Ambulatory Visit (HOSPITAL_COMMUNITY): Payer: Self-pay

## 2022-11-23 MED ORDER — POTASSIUM CHLORIDE CRYS ER 10 MEQ PO TBCR
10.0000 meq | EXTENDED_RELEASE_TABLET | Freq: Every day | ORAL | 2 refills | Status: DC
  Filled 2022-11-23: qty 30, 30d supply, fill #0
  Filled 2022-12-29: qty 30, 30d supply, fill #1
  Filled 2023-01-30: qty 30, 30d supply, fill #2

## 2022-12-18 ENCOUNTER — Other Ambulatory Visit (HOSPITAL_COMMUNITY): Payer: Self-pay

## 2022-12-18 ENCOUNTER — Telehealth: Payer: Self-pay | Admitting: Family Medicine

## 2022-12-18 NOTE — Telephone Encounter (Signed)
Called pt and she would like to refill rosuvastatin.. this will be her 1st fill with you, ok to refill? Pt reports she has been taking this

## 2022-12-18 NOTE — Telephone Encounter (Signed)
Patient would like a call back from a nurse. She has questions about which medication she was taking off of and which one she is supposed to continue taking. Best callback is [phone].

## 2022-12-19 ENCOUNTER — Other Ambulatory Visit (HOSPITAL_COMMUNITY): Payer: Self-pay

## 2022-12-19 MED ORDER — ROSUVASTATIN CALCIUM 20 MG PO TABS
ORAL_TABLET | ORAL | 0 refills | Status: DC
  Filled 2022-12-19: qty 90, 90d supply, fill #0

## 2022-12-19 NOTE — Addendum Note (Signed)
Addended by: Marinus Maw on: 12/19/2022 07:46 AM   Modules accepted: Orders

## 2022-12-19 NOTE — Telephone Encounter (Signed)
Rx sent 

## 2022-12-25 ENCOUNTER — Ambulatory Visit: Payer: BC Managed Care – PPO | Admitting: Nurse Practitioner

## 2022-12-27 ENCOUNTER — Ambulatory Visit: Payer: BC Managed Care – PPO | Admitting: Family Medicine

## 2022-12-29 ENCOUNTER — Other Ambulatory Visit: Payer: Self-pay | Admitting: Family Medicine

## 2022-12-29 ENCOUNTER — Other Ambulatory Visit: Payer: Self-pay

## 2022-12-29 ENCOUNTER — Other Ambulatory Visit (HOSPITAL_COMMUNITY): Payer: Self-pay

## 2022-12-29 DIAGNOSIS — I10 Essential (primary) hypertension: Secondary | ICD-10-CM

## 2022-12-29 MED ORDER — AMLODIPINE BESYLATE 5 MG PO TABS
5.0000 mg | ORAL_TABLET | Freq: Every day | ORAL | 0 refills | Status: DC
  Filled 2022-12-29 – 2023-01-30 (×2): qty 90, 90d supply, fill #0

## 2023-01-04 ENCOUNTER — Encounter: Payer: Self-pay | Admitting: Family Medicine

## 2023-01-04 ENCOUNTER — Ambulatory Visit: Payer: BC Managed Care – PPO | Admitting: Family Medicine

## 2023-01-04 VITALS — BP 130/84 | HR 105 | Temp 97.6°F | Ht <= 58 in | Wt 134.0 lb

## 2023-01-04 DIAGNOSIS — I10 Essential (primary) hypertension: Secondary | ICD-10-CM

## 2023-01-04 DIAGNOSIS — M545 Low back pain, unspecified: Secondary | ICD-10-CM | POA: Diagnosis not present

## 2023-01-04 DIAGNOSIS — K529 Noninfective gastroenteritis and colitis, unspecified: Secondary | ICD-10-CM | POA: Diagnosis not present

## 2023-01-04 DIAGNOSIS — E7849 Other hyperlipidemia: Secondary | ICD-10-CM

## 2023-01-04 DIAGNOSIS — E876 Hypokalemia: Secondary | ICD-10-CM

## 2023-01-04 NOTE — Assessment & Plan Note (Signed)
Continue conservative management. Follow up with Dr. Ophelia Charter if not improving.

## 2023-01-04 NOTE — Patient Instructions (Signed)
Please follow-up for labs in the morning.  Fasting, nothing to eat or drink except water for at least 6 to 8 hours.  Restart magnesium supplement.  For your low back pain, continue lidocaine patches.  Try using Voltaren gel which is over-the-counter.  I also recommend using a heating pad.  You can take Tylenol 500 or 1000 mg twice daily.  Follow-up with Dr. Ophelia Charter if it is not improving in the next week or 2.  I will be in touch with your results.

## 2023-01-04 NOTE — Progress Notes (Signed)
Subjective:     Patient ID: [name], female    DOB: 06-Aug-1960, 62 y.o.   MRN: [id number]  Chief Complaint  Patient presents with   Follow-up    F/u to recheck potassium    HPI  Discussed the use of AI scribe software for clinical note transcription with the patient, who gave verbal consent to proceed.  History of Present Illness         Here to follow up on chronic health conditions.   HTN- taking medications daily  Chronic hypokalemia   On statin therapy.   C/o lumbar spine pain x 1 wk. Hx of lumbar spine surgery by Dr. Ophelia Charter in 2017. No issues until last week. Denies injury.  Hx of arthritis  Using lidocaine patch.  No numbness, tingling or weakness.   Avoiding NSAIDs due to GERD.     Health Maintenance Due  Topic Date Due   MAMMOGRAM  10/22/2021   COVID-19 Vaccine (4 - 2023-24 season) 02/24/2022    Past Medical History:  Diagnosis Date   Anemia    Arthritis    "neck, hips" (04/25/2018)   Claustrophobia    Daily headache    GERD (gastroesophageal reflux disease)    Hiatal hernia    Hyperlipidemia    "hx" (04/25/2018)   Hypertension    Plantar fasciitis    hx - left foot   Stroke (HCC) 05/2014   "mini stroke" (04/25/2018)    Past Surgical History:  Procedure Laterality Date   ABDOMINAL HYSTERECTOMY  12/09   Secondary to fibroids   ARTERY BIOPSY Right 04/10/2018   Procedure: BIOPSY TEMPORAL ARTERY;  Surgeon: Larina Earthly, MD;  Location: [facility] OR;  Service: Vascular;  Laterality: Right;   CESAREAN SECTION  1986; 1988   COLONOSCOPY     FOOT SURGERY Left 03/2017   Bone Spurs Removed   LUMBAR LAMINECTOMY/DECOMPRESSION MICRODISCECTOMY N/A 04/13/2021   Procedure: LUMBAR FOUR- LUMBAR FIVE AND LUMBAR FIVE-SACRAL ONE  DECOMPRESSION;  Surgeon: Eldred Manges, MD;  Location: [facility] OR;  Service: Orthopedics;  Laterality: N/A;   RADIOLOGY WITH ANESTHESIA N/A 04/26/2018   Procedure: MRI WITH ANESTHESIA;  Surgeon: Radiologist, Medication, MD;  Location:  [facility] OR;  Service: Radiology;  Laterality: N/A;   RADIOLOGY WITH ANESTHESIA N/A 08/24/2019   Procedure: MRI WITH ANESTHESIA CERVICAL SPINE;  Surgeon: Radiologist, Medication, MD;  Location: [facility] OR;  Service: Radiology;  Laterality: N/A;   RADIOLOGY WITH ANESTHESIA N/A 10/02/2019   Procedure: MRI WITH ANESTHESIA CERVICAL WITHOUT CONTRAST;  Surgeon: Radiologist, Medication, MD;  Location: [facility] OR;  Service: Radiology;  Laterality: N/A;   TUBAL LIGATION  1988    Family History  Problem Relation Age of Onset   Cancer Mother 69       pancreatic cancer   Pancreatic cancer Mother    Heart disease Mother    Cancer Father 55       throat cancer   Esophageal cancer Father    Heart disease Sister    Cancer Maternal Aunt 46       breast cancer   Colon cancer Maternal Uncle    Colon cancer Paternal Uncle    Cancer Other        Died  from Colon cancer in 74's   Rectal cancer Neg Hx    Stomach cancer Neg Hx     Social History   Socioeconomic History   Marital status: Divorced    Spouse name: Not on file   Number of children:  2   Years of education: Not on file   Highest education level: Not on file  Occupational History   Occupation: unemployed    Comment: used to work in a [employer]  Tobacco Use   Smoking status: Some Days    Current packs/day: 0.25    Average packs/day: 0.3 packs/day for 38.0 years (9.5 ttl pk-yrs)    Types: Cigarettes   Smokeless tobacco: Never   Tobacco comments:    1 cigs/day 08/17/2022 km,cma  Vaping Use   Vaping status: Never Used  Substance and Sexual Activity   Alcohol use: Yes    Alcohol/week: 4.0 standard drinks of alcohol    Types: 4 Cans of beer per week   Drug use: Not Currently   Sexual activity: Not Currently    Birth control/protection: Surgical    Comment: Hysterectomy  Other Topics Concern   Not on file  Social History Narrative   10 th grade education.   Social Determinants of Health   Financial Resource Strain: Not on file  Food  Insecurity: Not on file  Transportation Needs: No Transportation Needs (08/27/2019)   PRAPARE - Administrator, Civil Service (Medical): No    Lack of Transportation (Non-Medical): No  Physical Activity: Not on file  Stress: Not on file  Social Connections: Not on file  Intimate Partner Violence: Not on file    Outpatient Medications Prior to Visit  Medication Sig Dispense Refill   amLODipine (NORVASC) 5 MG tablet Take 1 tablet (5 mg total) by mouth daily. 90 tablet 0   Cholecalciferol (VITAMIN D3) 50 MCG (2000 UT) capsule Take 1 capsule (2,000 Units total) by mouth daily. 100 capsule 3   diclofenac Sodium (VOLTAREN) 1 % GEL Apply 2 g topically 4 (four) times daily. 200 g 1   diltiazem 2 % GEL Please apply 5 times daily 30 g 1   fluticasone (FLONASE) 50 MCG/ACT nasal spray Place 2 sprays into both nostrils daily. 16 g 1   folic acid (FOLVITE) 1 MG tablet Take 1 tablet (1 mg total) by mouth daily. 30 tablet 2   lipase/protease/amylase (CREON) 36000 UNITS CPEP capsule Take 3 capsules by mouth 3 times daily with meals AND 2 capsules with snacks 390 capsule 11   MAGNESIUM PO Take 1 tablet by mouth daily.     pantoprazole (PROTONIX) 40 MG tablet Take 1 tablet (40 mg total) by mouth 2 (two) times daily. Take 30 minutes before breakfast. 60 tablet 2   potassium chloride (KLOR-CON M) 10 MEQ tablet Take 1 tablet (10 mEq total) by mouth daily. Please schedule office visit for further refills. 30 tablet 2   rosuvastatin (CRESTOR) 20 MG tablet Take 1 tablet by mouth daily. 90 tablet 0   amitriptyline (ELAVIL) 10 MG tablet Take 1 tablet (10 mg total) by mouth at bedtime, for headache suppression 30 tablet 0   No facility-administered medications prior to visit.    Allergies  Allergen Reactions   Aspirin Hives   Hydromorphone Hcl Hives and Nausea And Vomiting    Can take Norco   Crestor [Rosuvastatin]     myalgias   Latex Rash    Review of Systems  Constitutional:  Negative for  chills and fever.  Respiratory:  Negative for shortness of breath.   Cardiovascular:  Negative for chest pain, palpitations and leg swelling.  Gastrointestinal:  Positive for diarrhea. Negative for abdominal pain, constipation, nausea and vomiting.  Genitourinary:  Negative for dysuria, frequency and urgency.  Musculoskeletal:  Positive for back pain.  Neurological:  Negative for dizziness.       Objective:    Physical Exam Constitutional:      General: She is not in acute distress.    Appearance: She is not ill-appearing.  Eyes:     Extraocular Movements: Extraocular movements intact.     Conjunctiva/sclera: Conjunctivae normal.  Cardiovascular:     Rate and Rhythm: Normal rate.  Pulmonary:     Effort: Pulmonary effort is normal.  Musculoskeletal:     Cervical back: Normal range of motion and neck supple.  Skin:    General: Skin is warm and dry.  Neurological:     General: No focal deficit present.     Mental Status: She is alert and oriented to person, place, and time.     Motor: No weakness.     Gait: Gait normal.  Psychiatric:        Mood and Affect: Mood normal.        Behavior: Behavior normal.        Thought Content: Thought content normal.      BP 130/84 (BP Location: Left Arm, Patient Position: Sitting, Cuff Size: Large)   Pulse (!) 105   Temp 97.6 F (36.4 C) (Temporal)   Ht 4\' 10"  (1.473 m)   Wt 134 lb (60.8 kg)   LMP  (LMP Unknown)   SpO2 98%   BMI 28.01 kg/m  Wt Readings from Last 3 Encounters:  01/04/23 134 lb (60.8 kg)  11/02/22 134 lb (60.8 kg)  10/05/22 132 lb 4 oz (60 kg)       Assessment & Plan:   Problem List Items Addressed This Visit       Cardiovascular and Mediastinum   Essential hypertension, benign - Primary (Chronic)    Controlled. Continue amlodipine 5 mg daily and low sodium diet. Monitor BP at home.  Check renal function       Relevant Orders   Comprehensive metabolic panel     Digestive   Chronic diarrhea (Chronic)     Less of an issue recently. Followed by GI.         Other   Hyperlipemia (Chronic)    Continue statin therapy. Return for fasting lipids.       Relevant Orders   Lipid panel   Hypokalemia    R/t chronic diarrhea. Recheck K level and continue supplement.       Low back pain    Continue conservative management. Follow up with Dr. Ophelia Charter if not improving.        I have discontinued Yeila P. Totherow's amitriptyline. I am also having her maintain her fluticasone, diclofenac Sodium, Vitamin D3, MAGNESIUM PO, lipase/protease/amylase, diltiazem, folic acid, potassium chloride, pantoprazole, rosuvastatin, and amLODipine.  No orders of the defined types were placed in this encounter.

## 2023-01-04 NOTE — Assessment & Plan Note (Signed)
Less of an issue recently. Followed by GI.

## 2023-01-04 NOTE — Assessment & Plan Note (Signed)
R/t chronic diarrhea. Recheck K level and continue supplement.

## 2023-01-04 NOTE — Assessment & Plan Note (Addendum)
Controlled. Continue amlodipine 5 mg daily and low sodium diet. Monitor BP at home.  Check renal function

## 2023-01-04 NOTE — Assessment & Plan Note (Signed)
Continue statin therapy. Return for fasting lipids.

## 2023-01-05 LAB — COMPREHENSIVE METABOLIC PANEL
ALT: 73 U/L — ABNORMAL HIGH (ref 0–35)
AST: 41 U/L — ABNORMAL HIGH (ref 0–37)
Albumin: 4.3 g/dL (ref 3.5–5.2)
Alkaline Phosphatase: 68 U/L (ref 39–117)
BUN: 17 mg/dL (ref 6–23)
CO2: 25 mEq/L (ref 19–32)
Calcium: 9.6 mg/dL (ref 8.4–10.5)
Chloride: 104 mEq/L (ref 96–112)
Creatinine, Ser: 0.69 mg/dL (ref 0.40–1.20)
GFR: 93.75 mL/min (ref >60.00)
Glucose, Bld: 94 mg/dL (ref 70–99)
Potassium: 4.4 mEq/L (ref 3.5–5.1)
Sodium: 139 mEq/L (ref 135–145)
Total Bilirubin: 0.3 mg/dL (ref 0.2–1.2)
Total Protein: 7.4 g/dL (ref 6.0–8.3)

## 2023-01-05 LAB — LIPID PANEL
Cholesterol: 238 mg/dL — ABNORMAL HIGH (ref 0–200)
HDL: 43.5 mg/dL (ref >39.00)
NonHDL: 194.86
Total CHOL/HDL Ratio: 5
Triglycerides: >400 mg/dL — ABNORMAL HIGH (ref 0.0–149.0)
VLDL: 80 mg/dL — ABNORMAL HIGH (ref 0.0–40.0)

## 2023-01-05 LAB — LDL CHOLESTEROL, DIRECT: Direct LDL: 79 mg/dL

## 2023-01-09 NOTE — Progress Notes (Signed)
Please see if she was fasting for 8 hours before the labs. Her triglycerides are higher. Has she been eating more fried food? Fatty foods? Junk food? Her LDL (the bad cholesterol) is good, this is what the statin works on the best. We may need to add another medication called Lovaza Omega 3 fatty acid to lower her triglycerides. Let me know

## 2023-01-22 ENCOUNTER — Telehealth: Payer: Self-pay | Admitting: Internal Medicine

## 2023-01-22 ENCOUNTER — Ambulatory Visit: Payer: BC Managed Care – PPO | Admitting: Nurse Practitioner

## 2023-01-22 NOTE — Telephone Encounter (Signed)
Pt states she has been seeing blood in her stool for several weeks and is concerned. Pt scheduled to see Alcide Evener NP today at 2pm. Pt aware of appt.

## 2023-01-22 NOTE — Telephone Encounter (Signed)
Pts appt rescheduled. She is aware of appt.

## 2023-01-22 NOTE — Telephone Encounter (Signed)
Inbound call from patient stating she has had blood in her stool 3 times in the past 2 weeks. States she is also having abdominal pain. Requesting a call back to discuss further. Please advise, thank you.

## 2023-01-22 NOTE — Telephone Encounter (Signed)
Inbound call from patient requesting to speak with you  in regards today's appt. Patient stated she is not able to come for the appt because she left her keys inside the car and she is not willing to wait until October to be seen.Please advise

## 2023-01-30 ENCOUNTER — Encounter: Payer: Self-pay | Admitting: Nurse Practitioner

## 2023-01-30 ENCOUNTER — Other Ambulatory Visit (HOSPITAL_COMMUNITY): Payer: Self-pay

## 2023-01-30 ENCOUNTER — Other Ambulatory Visit: Payer: Self-pay | Admitting: Physician Assistant

## 2023-01-30 ENCOUNTER — Ambulatory Visit (INDEPENDENT_AMBULATORY_CARE_PROVIDER_SITE_OTHER): Payer: BC Managed Care – PPO | Admitting: Nurse Practitioner

## 2023-01-30 ENCOUNTER — Other Ambulatory Visit: Payer: Self-pay

## 2023-01-30 ENCOUNTER — Other Ambulatory Visit (INDEPENDENT_AMBULATORY_CARE_PROVIDER_SITE_OTHER): Payer: BC Managed Care – PPO

## 2023-01-30 ENCOUNTER — Telehealth: Payer: Self-pay | Admitting: Orthopaedic Surgery

## 2023-01-30 VITALS — BP 130/70 | HR 95 | Ht 59.0 in | Wt 135.0 lb

## 2023-01-30 DIAGNOSIS — K602 Anal fissure, unspecified: Secondary | ICD-10-CM | POA: Diagnosis not present

## 2023-01-30 DIAGNOSIS — D509 Iron deficiency anemia, unspecified: Secondary | ICD-10-CM

## 2023-01-30 DIAGNOSIS — E538 Deficiency of other specified B group vitamins: Secondary | ICD-10-CM

## 2023-01-30 DIAGNOSIS — K601 Chronic anal fissure: Secondary | ICD-10-CM | POA: Diagnosis not present

## 2023-01-30 DIAGNOSIS — D500 Iron deficiency anemia secondary to blood loss (chronic): Secondary | ICD-10-CM

## 2023-01-30 DIAGNOSIS — Z86010 Personal history of colonic polyps: Secondary | ICD-10-CM

## 2023-01-30 DIAGNOSIS — K219 Gastro-esophageal reflux disease without esophagitis: Secondary | ICD-10-CM | POA: Diagnosis not present

## 2023-01-30 DIAGNOSIS — K8689 Other specified diseases of pancreas: Secondary | ICD-10-CM

## 2023-01-30 DIAGNOSIS — K625 Hemorrhage of anus and rectum: Secondary | ICD-10-CM

## 2023-01-30 LAB — CBC
HCT: 34.5 % — ABNORMAL LOW (ref 36.0–46.0)
Hemoglobin: 10.8 g/dL — ABNORMAL LOW (ref 12.0–15.0)
MCHC: 31.3 g/dL (ref 30.0–36.0)
MCV: 73.4 fl — ABNORMAL LOW (ref 78.0–100.0)
Platelets: 284 10*3/uL (ref 150.0–400.0)
RBC: 4.7 Mil/uL (ref 3.87–5.11)
RDW: 14.3 % (ref 11.5–15.5)
WBC: 5.6 10*3/uL (ref 4.0–10.5)

## 2023-01-30 LAB — IBC + FERRITIN
Ferritin: 66 ng/mL (ref 10.0–291.0)
Iron: 67 ug/dL (ref 42–145)
Saturation Ratios: 15.8 % — ABNORMAL LOW (ref 20.0–50.0)
TIBC: 424.2 ug/dL (ref 250.0–450.0)
Transferrin: 303 mg/dL (ref 212.0–360.0)

## 2023-01-30 MED ORDER — LIDOCAINE 5 % EX OINT
1.0000 | TOPICAL_OINTMENT | Freq: Two times a day (BID) | CUTANEOUS | 0 refills | Status: DC
  Filled 2023-01-30: qty 35.44, 18d supply, fill #0

## 2023-01-30 MED ORDER — FOLIC ACID 1 MG PO TABS
1.0000 mg | ORAL_TABLET | Freq: Every day | ORAL | 2 refills | Status: DC
Start: 2023-01-30 — End: 2023-05-14
  Filled 2023-01-30: qty 30, 30d supply, fill #0
  Filled 2023-02-23: qty 30, 30d supply, fill #1
  Filled 2023-04-10: qty 30, 30d supply, fill #2

## 2023-01-30 NOTE — Telephone Encounter (Signed)
Patient called saying that her middle of her back is hurting and been hurting for 2 weeks. CB#[phone]

## 2023-01-30 NOTE — Progress Notes (Signed)
01/30/2023 CHRISTEL BIELEFELDT [id number] 1960-07-27   Chief Complaint: Anal burning pain and bleeding  History of Present Illness: [name] is a 62 y.o. female with pancreatic insufficiency by history of hypertension, anemia, GERD, chronic diarrhea with pancreatic insufficiency on Creon. S/P hysterectomy 2009 due to uterine fibroids and lumbar decompression 03/2021.  She was last seen in office by Dr. Marina Goodell 10/05/2022 with recurrent anal fissure discomfort and bleeding.  She was prescribed diltiazem 2% gel to apply to the fissure area 5 times daily.  She presents today with complaints of recurrent anal fissure symptoms.  She developed bright red rectal bleeding with anal burning discomfort 2 weeks ago.  She describes seeing bright red blood on the stool as well as on the toilet tissue.  She has consistently applied diltiazem gel up 5 times daily to the visual area since her office visit 09/2022.  No fevers or purulent rectal discharge.  She is passing a soft brown stool 3-4 times daily.  No diarrhea.  No hard stools or straining.  She remains on Creon 3 capsules with each meal and 2 with snacks.  She is content with this bowel pattern.  GERD symptoms are controlled on Pantoprazole 40 mg daily.      Latest Ref Rng & Units 11/14/2022    7:27 AM 09/05/2022    8:08 AM 08/03/2022    4:03 PM  CBC  WBC 4.0 - 10.5 K/uL 4.9  5.4  6.7   Hemoglobin 12.0 - 15.0 g/dL 30.8  65.7  84.6   Hematocrit 36.0 - 46.0 % 33.4  34.9  33.8   Platelets 150 - 400 K/uL 221  257  237.0    MCV 73.2.     Latest Ref Rng & Units 01/05/2023    8:07 AM 11/14/2022    7:27 AM 09/05/2022    8:08 AM  CMP  Glucose 70 - 99 mg/dL 94  962  95   BUN 6 - 23 mg/dL 17  12  12    Creatinine 0.40 - 1.20 mg/dL 9.52  8.41  3.24   Sodium 135 - 145 mEq/L 139  142  140   Potassium 3.5 - 5.1 mEq/L 4.4  3.6  3.4   Chloride 96 - 112 mEq/L 104  105  101   CO2 19 - 32 mEq/L 25  28  26    Calcium 8.4 - 10.5 mg/dL 9.6  9.0  8.7   Total  Protein 6.0 - 8.3 g/dL 7.4  7.1  7.7   Total Bilirubin 0.2 - 1.2 mg/dL 0.3  0.4  0.5   Alkaline Phos 39 - 117 U/L 68  67  65   AST 0 - 37 U/L 41  59  59   ALT 0 - 35 U/L 73  71  67     EGD 08/18/2021: 1. GERD  2. Normal EGD save hiatal hernia  3. Elevated liver tests.  Colonoscopy 04/10/2014: Normal colonoscopy  EGD 11/11/2019 done due to weight loss, diarrhea and microcytic anemia: - The esophagus was normal. - The stomach was normal. - The examined duodenum was normal. - The cardia and gastric fundus were normal on retroflexion.   Colonoscopy 11/10/2020 done due to weight loss, diarrhea and microcytic anemia: - The examined portion of the ileum was normal. - One 3 mm polyp in the transverse colon, removed with a cold snare. Resected and retrieved. - Small hemorrhoids. Prominent hypertrophic anal papilla - The entire examined colon is otherwise  normal on direct and retroflexion views. Status post biopsies. - 7 year colonoscopy recall 1. Surgical [P], random colon sites - COLONIC MUCOSA WITH NO SIGNIFICANT PATHOLOGIC FINDINGS. - NEGATIVE FOR ACTIVE INFLAMMATION AND OTHER ABNORMALITIES. 2. Surgical [P], colon, transverse, polyp - SESSILE SERRATED POLYP WITHOUT CYTOLOGIC DYSPLASIA   Colonoscopy 04/10/2014: Normal colonoscopy  Current Outpatient Medications on File Prior to Visit  Medication Sig Dispense Refill   amLODipine (NORVASC) 5 MG tablet Take 1 tablet (5 mg total) by mouth daily. 90 tablet 0   Cholecalciferol (VITAMIN D3) 50 MCG (2000 UT) capsule Take 1 capsule (2,000 Units total) by mouth daily. 100 capsule 3   diclofenac Sodium (VOLTAREN) 1 % GEL Apply 2 g topically 4 (four) times daily. 200 g 1   diltiazem 2 % GEL Please apply 5 times daily 30 g 1   fluticasone (FLONASE) 50 MCG/ACT nasal spray Place 2 sprays into both nostrils daily. 16 g 1   lipase/protease/amylase (CREON) 36000 UNITS CPEP capsule Take 3 capsules by mouth 3 times daily with meals AND 2 capsules with  snacks 390 capsule 11   MAGNESIUM PO Take 1 tablet by mouth daily.     pantoprazole (PROTONIX) 40 MG tablet Take 1 tablet (40 mg total) by mouth 2 (two) times daily. Take 30 minutes before breakfast. 60 tablet 2   potassium chloride (KLOR-CON M) 10 MEQ tablet Take 1 tablet (10 mEq total) by mouth daily. Please schedule office visit for further refills. 30 tablet 2   rosuvastatin (CRESTOR) 20 MG tablet Take 1 tablet by mouth daily. 90 tablet 0   No current facility-administered medications on file prior to visit.   Allergies  Allergen Reactions   Aspirin Hives   Hydromorphone Hcl Hives and Nausea And Vomiting    Can take Norco   Crestor [Rosuvastatin]     myalgias   Latex Rash   Current Medications, Allergies, Past Medical History, Past Surgical History, Family History and Social History were reviewed in [employer] record.  Review of Systems:   Constitutional: Negative for fever, sweats, chills or weight loss.  Respiratory: Negative for shortness of breath.   Cardiovascular: Negative for chest pain, palpitations and leg swelling.  Gastrointestinal: See HPI.  Musculoskeletal: Negative for back pain or muscle aches.  Neurological: Negative for dizziness, headaches or paresthesias.   Physical Exam: BP 130/70   Pulse 95   Ht 4\' 11"  (1.499 m)   Wt 135 lb (61.2 kg)   LMP  (LMP Unknown)   BMI 27.27 kg/m  LMP  (LMP Unknown)  General: 62 year old female in no acute distress. Head: Normocephalic and atraumatic. Eyes: No scleral icterus. Conjunctiva pink . Ears: Normal auditory acuity. Mouth: Dentition intact. No ulcers or lesions.  Lungs: Clear throughout to auscultation. Heart: Regular rate and rhythm, no murmur. Abdomen: Soft, nontender and nondistended. No masses or hepatomegaly. Normal bowel sounds x 4 quadrants.  Rectal: Posterior anal fissure open, tender without active bleeding.  Very limited rectal exam as the anal sphincter was extremely tight. Marchelle Folks  CMA present during exam.  Musculoskeletal: Symmetrical with no gross deformities. Extremities: No edema. Neurological: Alert oriented x 4. No focal deficits.  Psychological: Alert and cooperative. Normal mood and affect  Assessment and Recommendations:  62 year old female with recurrent anal fissure pain and bright red rectal bleeding.  No fevers or purulent rectal discharge. -Stop Diltiazem ointment, patient has persistently used 5 times daily since 09/2022, chronic use may potentially be causing more irritation than benefit. -Lidocaine 5% ointment  mix in a small amount with Desitin and insert into the anal opening to the external area twice daily for the next few weeks -Refer to colorectal surgery anal fissure clinic for further evaluation -Patient to contact office if symptoms persist or worsen  Chronic anemia -CBC, IBC and ferritin panel  History of colon polyps.  Colonoscopy 11/10/2020 identified one 3 mm sessile serrated polyp removed from transverse colon. -Next colon polyp surveillance colonoscopy due around 2028 per Dr. Marina Goodell  Pancreatic insufficiency -Continue Creon 2 to 3 capsules with meals and 2 with snacks.  GERD, stable -Continue Pantoprazole 40 mg daily

## 2023-01-30 NOTE — Telephone Encounter (Signed)
Could you possibly call patient and let her know that Dr. Ophelia Charter is out of the office and we will have to schedule an appointment with a different provider unless she wants to wait until September? She had lumbar spine surgery previously by Dr. Ophelia Charter, however, we have not seen her since February 2023.  Someone else could probably do the initial work up.    I tried to reach her from [place] office, but she did not answer.

## 2023-01-30 NOTE — Patient Instructions (Addendum)
Stop Diltiazem gel.  Mix Lidocaine 5% ointment with a quarter sized amount of Desitin and apply a small amount inside the anal opening and to the external anal area twice daily x 2 weeks.   Your provider has requested that you go to the basement level for lab work before leaving today. Press "B" on the elevator. The lab is located at the first door on the left as you exit the elevator.  Due to recent changes in healthcare laws, you may see the results of your imaging and laboratory studies on MyChart before your provider has had a chance to review them.  We understand that in some cases there may be results that are confusing or concerning to you. Not all laboratory results come back in the same time frame and the provider may be waiting for multiple results in order to interpret others.  Please give [place] 48 hours in order for your provider to thoroughly review all the results before contacting the office for clarification of your results.   Thank you for trusting me with your gastrointestinal care!   Alcide Evener, CRNP

## 2023-01-31 NOTE — Progress Notes (Signed)
Noted  

## 2023-02-06 ENCOUNTER — Telehealth: Payer: Self-pay | Admitting: Family Medicine

## 2023-02-06 ENCOUNTER — Other Ambulatory Visit (HOSPITAL_COMMUNITY): Payer: Self-pay

## 2023-02-06 ENCOUNTER — Ambulatory Visit: Payer: BC Managed Care – PPO | Admitting: Internal Medicine

## 2023-02-06 ENCOUNTER — Telehealth: Payer: Self-pay | Admitting: Orthopaedic Surgery

## 2023-02-06 ENCOUNTER — Other Ambulatory Visit: Payer: Self-pay | Admitting: Family Medicine

## 2023-02-06 MED ORDER — MELOXICAM 15 MG PO TABS
15.0000 mg | ORAL_TABLET | Freq: Every day | ORAL | 0 refills | Status: DC
  Filled 2023-02-06: qty 30, 30d supply, fill #0

## 2023-02-06 NOTE — Telephone Encounter (Signed)
Called pt and she reports she is working today so cannot do a visit but she has not had an injury, this is due to after her surgery w Dr. 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 She reports the pain is in her lower back and radiating down her legs all the way to her feet and it is a throbbing "tooth ache" pain. She was unable to sleep last night due to the pain and it is making work hard for her

## 2023-02-06 NOTE — Telephone Encounter (Signed)
Patient called. Would like some pain medication. Her cb# is [phone]

## 2023-02-06 NOTE — Telephone Encounter (Signed)
Error

## 2023-02-06 NOTE — Telephone Encounter (Signed)
No pain meds per Dr. Fara Boros. Offered her OTC tylenol/ibuprofen until seen by [facility] on Friday

## 2023-02-06 NOTE — Telephone Encounter (Signed)
Pt is requesting medication to help with sever back pain. Pt was scheduled to see Dr. Jonny Ruiz on 8.13.24 but said she would not be able to come in because she can not get off of work.   Patient states she has been given pain medication by her pcp before, but could not remember what kind.   I advised the pt the best way to get medication for her symptoms was to come in for her scheduled appointment but she declined to do so.

## 2023-02-06 NOTE — Telephone Encounter (Signed)
Pt was called and informed medication was sent

## 2023-02-09 ENCOUNTER — Other Ambulatory Visit (HOSPITAL_COMMUNITY): Payer: Self-pay

## 2023-02-09 ENCOUNTER — Other Ambulatory Visit: Payer: Self-pay

## 2023-02-09 ENCOUNTER — Encounter: Payer: Self-pay | Admitting: Physician Assistant

## 2023-02-09 ENCOUNTER — Ambulatory Visit: Payer: BC Managed Care – PPO | Admitting: Physician Assistant

## 2023-02-09 DIAGNOSIS — M5416 Radiculopathy, lumbar region: Secondary | ICD-10-CM

## 2023-02-09 MED ORDER — HYDROCODONE-ACETAMINOPHEN 5-325 MG PO TABS
1.0000 | ORAL_TABLET | ORAL | 0 refills | Status: DC | PRN
  Filled 2023-02-09: qty 20, 4d supply, fill #0

## 2023-02-09 MED ORDER — METHYLPREDNISOLONE 4 MG PO TBPK
ORAL_TABLET | ORAL | 0 refills | Status: DC
  Filled 2023-02-09: qty 21, 6d supply, fill #0

## 2023-02-09 NOTE — Progress Notes (Signed)
Office Visit Note   Patient: [name]           Date of Birth: [birth year]           MRN: [id number] Visit Date: 02/09/2023              Requested by: Avanell Shackleton, NP-C [address] [place],  [place] [address] PCP: Avanell Shackleton, NP-C  Chief Complaint  Patient presents with   Lower Back - Pain      HPI: [name] is a pleasant 62 year old woman who is a patient of Dr. Ophelia Charter.  She is status post lumbar decompression L4-5 and L5-S1 in 2022.  She has had some intermittent problems but over the course the last 2 weeks she has had intermittent back pain in her lower back.  Denies any fever or chills.  She denies any loss of bowel or bladder control.  The pain radiates down both of her legs and is worse when she tries to walk.  Rates the pain is severe.  Her primary care physician recently gave her some meloxicam.  Assessment & Plan: Visit Diagnoses:  1. Radiculopathy, lumbar region     Plan: No weakness in her exam today but she does have quite a bit of pain.  Pain is relieved with extension of her back worse with bending and walking.  Findings consistent with a claudication picture like she has had in the past.  She has done a Medrol Dosepak in the past and did not have any problems with it.  We are going to try that.  I told her not to take meloxicam or any other NSAIDs while she is taking this.  Also to take it with food.  Will also call her in a few Norco.  We will take her out of work for Monday and Tuesday but she would like to return to light duties after that.  Would like her to follow-up with Dr. Ophelia Charter for further evaluation and recommendations  Follow-Up Instructions: No follow-ups on file.   Ortho Exam  Patient is alert, oriented, no adenopathy, well-dressed, normal affect, normal respiratory effort. Lamination of her low back shows a well-healed surgical incision without any surrounding erythema or redness or fluctuance.  She has pain with forward flexion  simply slight release with extension.  Her strength is 5 out of 5 in her lower extremities with dorsiflexion plantarflexion of her ankles and list flexion extension of her legs.  Compartments are soft and compressible sensation is intact  Imaging: No results found. No images are attached to the encounter.  Labs: Lab Results  Component Value Date   HGBA1C 5.9 10/20/2021   HGBA1C 5.2 08/21/2019   HGBA1C 5.1 01/09/2018   ESRSEDRATE 33 (H) 12/22/2020   ESRSEDRATE 89 (H) 09/25/2019   ESRSEDRATE 30 04/25/2018   CRP <1.0 06/07/2022   CRP <1.0 08/18/2021   CRP 5 09/25/2019   REPTSTATUS 05/19/2014 FINAL 05/17/2014   CULT  05/17/2014    Multiple bacterial morphotypes present, none predominant. Suggest appropriate recollection if clinically indicated. Performed at [facility]    LABORGA NO GROWTH 5 DAYS 04/30/2013     Lab Results  Component Value Date   ALBUMIN 4.3 01/05/2023   ALBUMIN 4.4 11/14/2022   ALBUMIN 4.3 09/05/2022    Lab Results  Component Value Date   MG 1.1 (L) 08/03/2022   MG 1.5 03/15/2022   MG 1.3 (L) 12/02/2021   Lab Results  Component Value Date  VD25OH 24.50 (L) 06/07/2022    No results found for: "PREALBUMIN"    Latest Ref Rng & Units 01/30/2023    2:32 PM 11/14/2022    7:27 AM 09/05/2022    8:08 AM  CBC EXTENDED  WBC 4.0 - 10.5 K/uL 5.6  4.9  5.4   RBC 3.87 - 5.11 Mil/uL 4.70  4.56  4.86   Hemoglobin 12.0 - 15.0 g/dL 29.5  28.4  13.2   HCT 36.0 - 46.0 % 34.5  33.4  34.9   Platelets 150.0 - 400.0 K/uL 284.0  221  257   NEUT# 1.7 - 7.7 K/uL  2.1    Lymph# 0.7 - 4.0 K/uL  2.3       There is no height or weight on file to calculate BMI.  Orders:  Orders Placed This Encounter  Procedures   XR Lumbar Spine 2-3 Views   Meds ordered this encounter  Medications   HYDROcodone-acetaminophen (NORCO/VICODIN) 5-325 MG tablet    Sig: Take 1 tablet by mouth every 4 (four) hours as needed for moderate pain.    Dispense:  20 tablet    Refill:  0    methylPREDNISolone (MEDROL DOSEPAK) 4 MG TBPK tablet    Sig: Take as directed with food    Dispense:  21 tablet    Refill:  0     Procedures: No procedures performed  Clinical Data: No additional findings.  ROS:  All other systems negative, except as noted in the HPI. Review of Systems  Objective: Vital Signs: LMP  (LMP Unknown)   Specialty Comments:  No specialty comments available.  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   Cough due to ACE inhibitor 05/16/2022   Myalgia 05/16/2022   Acute bronchitis 05/16/2022   Hypomagnesemia 03/19/2022   Vitamin B 12 deficiency 03/19/2022   Low TSH level 03/19/2022   Right elbow pain 03/19/2022   Encounter for screening mammogram for malignant neoplasm of breast 03/19/2022   History of hypertension 12/13/2021   History of smoking 12/13/2021   Elevated blood pressure reading 12/13/2021   Contusion of right knee 12/13/2021   Fall 12/13/2021   Muscle cramps 12/04/2021   Elevated liver enzymes 12/04/2021   Paresthesias 12/04/2021   Elevated LFTs 12/04/2021   Chronic anemia 10/20/2021   Chronic foot pain 10/20/2021   Folic acid deficiency 09/19/2021   Chronic pain syndrome 09/07/2021   Status post lumbar spine surgery for decompression of spinal cord 05/24/2021   Dyspepsia 05/23/2021   History of gastroesophageal reflux (GERD) 05/23/2021   History of lumbar laminectomy for spinal cord decompression 04/26/2021   Lumbar stenosis 04/13/2021   Tension headache 12/22/2020   Low back pain 10/20/2020   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/28/2020   Hypokalemia 12/17/2019   Chronic diarrhea 09/14/2017   History of stroke 06/12/2014   Cervical radiculitis 02/12/2014   Essential hypertension, benign 01/24/2010   Microcytic anemia 01/11/2010   Hyperlipemia 05/28/2007   Current smoker 05/28/2007   GERD 05/28/2007   Past Medical History:  Diagnosis Date    Anemia    Arthritis    "neck, hips" (04/25/2018)   Claustrophobia    Daily headache    GERD (gastroesophageal reflux disease)    Hiatal hernia    Hyperlipidemia    "hx" (04/25/2018)   Hypertension    Plantar fasciitis    hx - left foot   Stroke (HCC) 05/2014   "mini stroke" (04/25/2018)  Family History  Problem Relation Age of Onset   Pancreatic cancer Mother 27   Heart disease Mother    Esophageal cancer Father 59   Heart disease Sister    Breast cancer Maternal Aunt 60   Colon cancer Maternal Uncle    Pancreatic cancer Maternal Uncle    Colon cancer Paternal Uncle    Rectal cancer Neg Hx    Stomach cancer Neg Hx     Past Surgical History:  Procedure Laterality Date   ABDOMINAL HYSTERECTOMY  12/09   Secondary to fibroids   ARTERY BIOPSY Right 04/10/2018   Procedure: BIOPSY TEMPORAL ARTERY;  Surgeon: Larina Earthly, MD;  Location: [facility] OR;  Service: Vascular;  Laterality: Right;   CESAREAN SECTION  1986; 1988   COLONOSCOPY     FOOT SURGERY Left 03/2017   Bone Spurs Removed   LUMBAR LAMINECTOMY/DECOMPRESSION MICRODISCECTOMY N/A 04/13/2021   Procedure: LUMBAR FOUR- LUMBAR FIVE AND LUMBAR FIVE-SACRAL ONE  DECOMPRESSION;  Surgeon: Eldred Manges, MD;  Location: [facility] OR;  Service: Orthopedics;  Laterality: N/A;   RADIOLOGY WITH ANESTHESIA N/A 04/26/2018   Procedure: MRI WITH ANESTHESIA;  Surgeon: Radiologist, Medication, MD;  Location: [facility] OR;  Service: Radiology;  Laterality: N/A;   RADIOLOGY WITH ANESTHESIA N/A 08/24/2019   Procedure: MRI WITH ANESTHESIA CERVICAL SPINE;  Surgeon: Radiologist, Medication, MD;  Location: [facility] OR;  Service: Radiology;  Laterality: N/A;   RADIOLOGY WITH ANESTHESIA N/A 10/02/2019   Procedure: MRI WITH ANESTHESIA CERVICAL WITHOUT CONTRAST;  Surgeon: Radiologist, Medication, MD;  Location: [facility] OR;  Service: Radiology;  Laterality: N/A;   TUBAL LIGATION  1988   Social History   Occupational History   Not on file  Tobacco Use   Smoking status: Some Days     Current packs/day: 0.25    Average packs/day: 0.3 packs/day for 38.0 years (9.5 ttl pk-yrs)    Types: Cigarettes   Smokeless tobacco: Never   Tobacco comments:    1 cigs/day 08/17/2022 km,cma  Vaping Use   Vaping status: Never Used  Substance and Sexual Activity   Alcohol use: Yes    Alcohol/week: 4.0 standard drinks of alcohol    Types: 4 Cans of beer per week   Drug use: Not Currently   Sexual activity: Not Currently    Birth control/protection: Surgical    Comment: Hysterectomy

## 2023-02-21 ENCOUNTER — Telehealth: Payer: Self-pay | Admitting: Internal Medicine

## 2023-02-21 NOTE — Telephone Encounter (Signed)
Patient called to cancel 08/29 appointments and rescheduled to September.

## 2023-02-22 ENCOUNTER — Inpatient Hospital Stay: Payer: BC Managed Care – PPO

## 2023-02-22 ENCOUNTER — Inpatient Hospital Stay: Payer: BC Managed Care – PPO | Admitting: Internal Medicine

## 2023-02-23 ENCOUNTER — Other Ambulatory Visit: Payer: Self-pay | Admitting: Family Medicine

## 2023-02-23 ENCOUNTER — Other Ambulatory Visit (HOSPITAL_COMMUNITY): Payer: Self-pay

## 2023-02-23 MED ORDER — POTASSIUM CHLORIDE CRYS ER 10 MEQ PO TBCR
10.0000 meq | EXTENDED_RELEASE_TABLET | Freq: Every day | ORAL | 2 refills | Status: DC
  Filled 2023-02-23: qty 30, 30d supply, fill #0
  Filled 2023-04-10: qty 30, 30d supply, fill #1
  Filled 2023-05-14: qty 30, 30d supply, fill #2

## 2023-02-23 NOTE — Telephone Encounter (Signed)
Please advise if she is continuing this

## 2023-03-02 ENCOUNTER — Ambulatory Visit: Payer: BC Managed Care – PPO | Admitting: Orthopaedic Surgery

## 2023-03-15 ENCOUNTER — Other Ambulatory Visit: Payer: Self-pay | Admitting: Medical Oncology

## 2023-03-15 DIAGNOSIS — D509 Iron deficiency anemia, unspecified: Secondary | ICD-10-CM

## 2023-03-19 ENCOUNTER — Inpatient Hospital Stay: Payer: BC Managed Care – PPO | Attending: Internal Medicine

## 2023-03-19 ENCOUNTER — Inpatient Hospital Stay (HOSPITAL_BASED_OUTPATIENT_CLINIC_OR_DEPARTMENT_OTHER): Payer: BC Managed Care – PPO | Admitting: Internal Medicine

## 2023-03-19 VITALS — BP 133/91 | HR 81 | Temp 98.6°F | Resp 17 | Ht 59.0 in | Wt 135.0 lb

## 2023-03-19 DIAGNOSIS — K648 Other hemorrhoids: Secondary | ICD-10-CM | POA: Diagnosis not present

## 2023-03-19 DIAGNOSIS — D509 Iron deficiency anemia, unspecified: Secondary | ICD-10-CM | POA: Diagnosis present

## 2023-03-19 DIAGNOSIS — D649 Anemia, unspecified: Secondary | ICD-10-CM | POA: Diagnosis not present

## 2023-03-19 LAB — CMP (CANCER CENTER ONLY)
ALT: 44 U/L (ref 0–44)
AST: 28 U/L (ref 15–41)
Albumin: 4.1 g/dL (ref 3.5–5.0)
Alkaline Phosphatase: 72 U/L (ref 38–126)
Anion gap: 10 (ref 5–15)
BUN: 17 mg/dL (ref 8–23)
CO2: 24 mmol/L (ref 22–32)
Calcium: 8.9 mg/dL (ref 8.9–10.3)
Chloride: 108 mmol/L (ref 98–111)
Creatinine: 0.88 mg/dL (ref 0.44–1.00)
GFR, Estimated: >60 mL/min (ref >60)
Glucose, Bld: 124 mg/dL — ABNORMAL HIGH (ref 70–99)
Potassium: 3.1 mmol/L — ABNORMAL LOW (ref 3.5–5.1)
Sodium: 142 mmol/L (ref 135–145)
Total Bilirubin: 0.3 mg/dL (ref 0.3–1.2)
Total Protein: 7.2 g/dL (ref 6.5–8.1)

## 2023-03-19 LAB — CBC WITH DIFFERENTIAL (CANCER CENTER ONLY)
Abs Immature Granulocytes: 0.01 10*3/uL (ref 0.00–0.07)
Basophils Absolute: 0 10*3/uL (ref 0.0–0.1)
Basophils Relative: 0 %
Eosinophils Absolute: 0.1 10*3/uL (ref 0.0–0.5)
Eosinophils Relative: 3 %
HCT: 34.9 % — ABNORMAL LOW (ref 36.0–46.0)
Hemoglobin: 11 g/dL — ABNORMAL LOW (ref 12.0–15.0)
Immature Granulocytes: 0 %
Lymphocytes Relative: 43 %
Lymphs Abs: 2 10*3/uL (ref 0.7–4.0)
MCH: 22.8 pg — ABNORMAL LOW (ref 26.0–34.0)
MCHC: 31.5 g/dL (ref 30.0–36.0)
MCV: 72.4 fL — ABNORMAL LOW (ref 80.0–100.0)
Monocytes Absolute: 0.3 10*3/uL (ref 0.1–1.0)
Monocytes Relative: 6 %
Neutro Abs: 2.2 10*3/uL (ref 1.7–7.7)
Neutrophils Relative %: 48 %
Platelet Count: 266 10*3/uL (ref 150–400)
RBC: 4.82 MIL/uL (ref 3.87–5.11)
RDW: 13.6 % (ref 11.5–15.5)
WBC Count: 4.7 10*3/uL (ref 4.0–10.5)
nRBC: 0 % (ref 0.0–0.2)

## 2023-03-19 LAB — FOLATE: Folate: >40 ng/mL (ref >5.9)

## 2023-03-19 LAB — IRON AND IRON BINDING CAPACITY (CC-WL,HP ONLY)
Iron: 77 ug/dL (ref 28–170)
Saturation Ratios: 19 % (ref 10.4–31.8)
TIBC: 396 ug/dL (ref 250–450)
UIBC: 319 ug/dL (ref 148–442)

## 2023-03-19 LAB — VITAMIN B12: Vitamin B-12: 234 pg/mL (ref 180–914)

## 2023-03-19 LAB — FERRITIN: Ferritin: 97 ng/mL (ref 11–307)

## 2023-03-19 NOTE — Progress Notes (Signed)
[facility] Telephone:([phone]   Fax:([phone]  OFFICE PROGRESS NOTE  Avanell Shackleton, NP-C [address] [place] [address]  DIAGNOSIS: Microcytic anemia with history of iron deficiency  PRIOR THERAPY: None  CURRENT THERAPY: Over-the-counter oral iron tablets in addition to folic acid once daily  INTERVAL HISTORY: [name] 62 y.o. female  Discussed the use of AI scribe software for clinical note transcription with the patient, who gave verbal consent to proceed.  History of Present Illness   The patient, under the care of a gastroenterologist, presents with ongoing rectal bleeding, chronic diarrhea and internal hemorrhoids. Despite the application of prescribed diltiazem 2% ointment and a combination of a baby rash ointment and lidocaine, he reports no improvement in symptoms. He also reports occasional dizziness, but denies any shortness of breath.  The patient has been taking iron supplements daily for iron deficiency anemia, but without vitamin C due to intolerance to orange juice. Despite this, recent lab results indicate an improvement in hemoglobin levels compared to previous months. The patient acknowledges the presence of blood in their stool, likely due to the ongoing anal issue.       MEDICAL HISTORY: Past Medical History:  Diagnosis Date   Anemia    Arthritis    "neck, hips" (04/25/2018)   Claustrophobia    Daily headache    GERD (gastroesophageal reflux disease)    Hiatal hernia    Hyperlipidemia    "hx" (04/25/2018)   Hypertension    Plantar fasciitis    hx - left foot   Stroke (HCC) 05/2014   "mini stroke" (04/25/2018)    ALLERGIES:  is allergic to aspirin, hydromorphone hcl, crestor [rosuvastatin], and latex.  MEDICATIONS:  Current Outpatient Medications  Medication Sig Dispense Refill   amLODipine (NORVASC) 5 MG tablet Take 1 tablet (5 mg total) by mouth daily. 90 tablet 0   Cholecalciferol (VITAMIN D3)  50 MCG (2000 UT) capsule Take 1 capsule (2,000 Units total) by mouth daily. 100 capsule 3   diclofenac Sodium (VOLTAREN) 1 % GEL Apply 2 g topically 4 (four) times daily. 200 g 1   diltiazem 2 % GEL Please apply 5 times daily 30 g 1   fluticasone (FLONASE) 50 MCG/ACT nasal spray Place 2 sprays into both nostrils daily. 16 g 1   folic acid (FOLVITE) 1 MG tablet Take 1 tablet (1 mg total) by mouth daily. 30 tablet 2   HYDROcodone-acetaminophen (NORCO/VICODIN) 5-325 MG tablet Take 1 tablet by mouth every 4 (four) hours as needed for moderate pain. 20 tablet 0   lidocaine (XYLOCAINE) 5 % ointment Apply a small amount inside the anal area twice daily for 4 weeks. 35.44 g 0   lipase/protease/amylase (CREON) 36000 UNITS CPEP capsule Take 3 capsules by mouth 3 times daily with meals AND 2 capsules with snacks 390 capsule 11   MAGNESIUM PO Take 1 tablet by mouth daily.     meloxicam (MOBIC) 15 MG tablet Take 1 tablet (15 mg total) by mouth daily. 30 tablet 0   methylPREDNISolone (MEDROL DOSEPAK) 4 MG TBPK tablet Take as directed with food 21 tablet 0   pantoprazole (PROTONIX) 40 MG tablet Take 1 tablet (40 mg total) by mouth 2 (two) times daily. Take 30 minutes before breakfast. 60 tablet 2   potassium chloride (KLOR-CON M) 10 MEQ tablet Take 1 tablet (10 mEq total) by mouth daily. Please schedule office visit for further refills. 30 tablet 2  rosuvastatin (CRESTOR) 20 MG tablet Take 1 tablet by mouth daily. 90 tablet 0   No current facility-administered medications for this visit.    SURGICAL HISTORY:  Past Surgical History:  Procedure Laterality Date   ABDOMINAL HYSTERECTOMY  12/09   Secondary to fibroids   ARTERY BIOPSY Right 04/10/2018   Procedure: BIOPSY TEMPORAL ARTERY;  Surgeon: Larina Earthly, MD;  Location: [facility] OR;  Service: Vascular;  Laterality: Right;   CESAREAN SECTION  1986; 1988   COLONOSCOPY     FOOT SURGERY Left 03/2017   Bone Spurs Removed   LUMBAR LAMINECTOMY/DECOMPRESSION  MICRODISCECTOMY N/A 04/13/2021   Procedure: LUMBAR FOUR- LUMBAR FIVE AND LUMBAR FIVE-SACRAL ONE  DECOMPRESSION;  Surgeon: Eldred Manges, MD;  Location: [facility] OR;  Service: Orthopedics;  Laterality: N/A;   RADIOLOGY WITH ANESTHESIA N/A 04/26/2018   Procedure: MRI WITH ANESTHESIA;  Surgeon: Radiologist, Medication, MD;  Location: [facility] OR;  Service: Radiology;  Laterality: N/A;   RADIOLOGY WITH ANESTHESIA N/A 08/24/2019   Procedure: MRI WITH ANESTHESIA CERVICAL SPINE;  Surgeon: Radiologist, Medication, MD;  Location: [facility] OR;  Service: Radiology;  Laterality: N/A;   RADIOLOGY WITH ANESTHESIA N/A 10/02/2019   Procedure: MRI WITH ANESTHESIA CERVICAL WITHOUT CONTRAST;  Surgeon: Radiologist, Medication, MD;  Location: [facility] OR;  Service: Radiology;  Laterality: N/A;   TUBAL LIGATION  1988    REVIEW OF SYSTEMS:  A comprehensive review of systems was negative except for: Constitutional: positive for fatigue   PHYSICAL EXAMINATION: General appearance: alert, cooperative, fatigued, and no distress Head: Normocephalic, without obvious abnormality, atraumatic Neck: no adenopathy, no JVD, supple, symmetrical, trachea midline, and thyroid not enlarged, symmetric, no tenderness/mass/nodules Lymph nodes: Cervical, supraclavicular, and axillary nodes normal. Resp: clear to auscultation bilaterally Back: symmetric, no curvature. ROM normal. No CVA tenderness. Cardio: regular rate and rhythm, S1, S2 normal, no murmur, click, rub or gallop GI: soft, non-tender; bowel sounds normal; no masses,  no organomegaly Extremities: extremities normal, atraumatic, no cyanosis or edema  ECOG PERFORMANCE STATUS: 1 - Symptomatic but completely ambulatory  Blood pressure (!) 133/91, pulse 81, temperature 98.6 F (37 C), temperature source Oral, resp. rate 17, height 4\' 11"  (1.499 m), weight 135 lb (61.2 kg), SpO2 100%.  LABORATORY DATA: Lab Results  Component Value Date   WBC 4.7 03/19/2023   HGB 11.0 (L) 03/19/2023   HCT 34.9 (L)  03/19/2023   MCV 72.4 (L) 03/19/2023   PLT 266 03/19/2023      Chemistry      Component Value Date/Time   NA 139 01/05/2023 0807   NA 143 02/18/2020 1429   K 4.4 01/05/2023 0807   CL 104 01/05/2023 0807   CO2 25 01/05/2023 0807   BUN 17 01/05/2023 0807   BUN 13 02/18/2020 1429   CREATININE 0.69 01/05/2023 0807   CREATININE 0.60 11/14/2022 0727   CREATININE 0.60 04/23/2014 0910      Component Value Date/Time   CALCIUM 9.6 01/05/2023 0807   ALKPHOS 68 01/05/2023 0807   AST 41 (H) 01/05/2023 0807   AST 59 (H) 11/14/2022 0727   ALT 73 (H) 01/05/2023 0807   ALT 71 (H) 11/14/2022 0727   BILITOT 0.3 01/05/2023 0807   BILITOT 0.4 11/14/2022 0727       RADIOGRAPHIC STUDIES: No results found.  ASSESSMENT AND PLAN: This is a very pleasant 62 years old African-American female with history of microcytic anemia of unclear etiology.  The patient had extensive work-up performed in the past including iron study and ferritin that  were unremarkable.  She also had hemoglobin electrophoresis that showed no evidence for basal cell ischemia or sickle cell trait.     Internal hemorrhoids and chronic diarrhea: Persistent bleeding despite topical treatment with Diltiazem 2% ointment and a combination of diaper rash cream and Lidocaine. Managed by gastroenterologist Dr. Marina Goodell. -Continue current topical treatment as prescribed by Dr. Marina Goodell.  Iron Deficiency Anemia Likely secondary to anal bleeding. Hemoglobin improved to 11.0 and hematocrit 34.9 from previous visit. Patient is taking oral iron supplement daily, but not with Vitamin C. -Continue oral iron supplement daily, add Vitamin C to aid absorption. -Check iron studies and ferritin when results are available. If low, consider intravenous iron infusion. -Return in three months for follow-up and repeat CBC, iron studies, and ferritin.     The patient was advised to call immediately if she has any other concerning symptoms in the interval. The  patient voices understanding of current disease status and treatment options and is in agreement with the current care plan.  All questions were answered. The patient knows to call the clinic with any problems, questions or concerns. We can certainly see the patient much sooner if necessary.  The total time spent in the appointment was 20 minutes.  Disclaimer: This note was dictated with voice recognition software. Similar sounding words can inadvertently be transcribed and may not be corrected upon review.

## 2023-03-22 LAB — PROTEIN ELECTROPHORESIS, SERUM, WITH REFLEX
A/G Ratio: 1 (ref 0.7–1.7)
Albumin ELP: 3.3 g/dL (ref 2.9–4.4)
Alpha-1-Globulin: 0.3 g/dL (ref 0.0–0.4)
Alpha-2-Globulin: 0.9 g/dL (ref 0.4–1.0)
Beta Globulin: 1.2 g/dL (ref 0.7–1.3)
Gamma Globulin: 0.9 g/dL (ref 0.4–1.8)
Globulin, Total: 3.2 g/dL (ref 2.2–3.9)
Total Protein ELP: 6.5 g/dL (ref 6.0–8.5)

## 2023-03-26 ENCOUNTER — Other Ambulatory Visit (HOSPITAL_COMMUNITY): Payer: Self-pay

## 2023-03-26 ENCOUNTER — Other Ambulatory Visit: Payer: Self-pay | Admitting: Nurse Practitioner

## 2023-03-26 MED ORDER — PANTOPRAZOLE SODIUM 40 MG PO TBEC
40.0000 mg | DELAYED_RELEASE_TABLET | Freq: Two times a day (BID) | ORAL | 2 refills | Status: DC
  Filled 2023-03-26: qty 60, 30d supply, fill #0
  Filled 2023-05-08: qty 60, 30d supply, fill #1
  Filled 2023-06-04: qty 60, 30d supply, fill #2

## 2023-03-27 ENCOUNTER — Other Ambulatory Visit (HOSPITAL_COMMUNITY): Payer: Self-pay

## 2023-03-30 ENCOUNTER — Telehealth: Payer: Self-pay | Admitting: Family Medicine

## 2023-03-30 NOTE — Telephone Encounter (Signed)
Pt reports she has been using ice packs and ibuprofen for the pain but it was so severe this morning she could not get out of bed. She reports this happens every now and then when she is on her feet a lot for her job and its her arthritis that acts up

## 2023-03-30 NOTE — Telephone Encounter (Signed)
Pt called wanting to speak to someone about her back for what she need to take or what to do. Pt is in a lot of pain advise her to come in pt stated she can't drive . Please advise.

## 2023-03-30 NOTE — Telephone Encounter (Signed)
Please advise what pt can take for back pain

## 2023-03-30 NOTE — Telephone Encounter (Signed)
Called and notified pt, pt verbalized understanding  

## 2023-04-06 ENCOUNTER — Encounter: Payer: BC Managed Care – PPO | Admitting: Family Medicine

## 2023-04-10 ENCOUNTER — Other Ambulatory Visit (HOSPITAL_COMMUNITY): Payer: Self-pay

## 2023-04-10 ENCOUNTER — Other Ambulatory Visit: Payer: Self-pay

## 2023-04-17 ENCOUNTER — Other Ambulatory Visit: Payer: Self-pay | Admitting: Family Medicine

## 2023-04-17 ENCOUNTER — Other Ambulatory Visit (HOSPITAL_COMMUNITY): Payer: Self-pay

## 2023-04-17 DIAGNOSIS — I10 Essential (primary) hypertension: Secondary | ICD-10-CM

## 2023-04-17 MED ORDER — AMLODIPINE BESYLATE 5 MG PO TABS
5.0000 mg | ORAL_TABLET | Freq: Every day | ORAL | 0 refills | Status: DC
Start: 2023-04-17 — End: 2023-06-04
  Filled 2023-04-17: qty 90, 90d supply, fill #0

## 2023-04-18 ENCOUNTER — Other Ambulatory Visit: Payer: Self-pay

## 2023-04-20 ENCOUNTER — Encounter: Payer: Self-pay | Admitting: Family Medicine

## 2023-04-20 ENCOUNTER — Ambulatory Visit (INDEPENDENT_AMBULATORY_CARE_PROVIDER_SITE_OTHER): Payer: BC Managed Care – PPO | Admitting: Family Medicine

## 2023-04-20 VITALS — BP 116/76 | HR 95 | Temp 97.6°F | Ht 59.0 in | Wt 136.0 lb

## 2023-04-20 DIAGNOSIS — E538 Deficiency of other specified B group vitamins: Secondary | ICD-10-CM

## 2023-04-20 DIAGNOSIS — F17200 Nicotine dependence, unspecified, uncomplicated: Secondary | ICD-10-CM | POA: Diagnosis not present

## 2023-04-20 DIAGNOSIS — D649 Anemia, unspecified: Secondary | ICD-10-CM | POA: Diagnosis not present

## 2023-04-20 DIAGNOSIS — Z0000 Encounter for general adult medical examination without abnormal findings: Secondary | ICD-10-CM | POA: Diagnosis not present

## 2023-04-20 DIAGNOSIS — I10 Essential (primary) hypertension: Secondary | ICD-10-CM | POA: Diagnosis not present

## 2023-04-20 DIAGNOSIS — E876 Hypokalemia: Secondary | ICD-10-CM | POA: Diagnosis not present

## 2023-04-20 DIAGNOSIS — Z1231 Encounter for screening mammogram for malignant neoplasm of breast: Secondary | ICD-10-CM

## 2023-04-20 DIAGNOSIS — Z0001 Encounter for general adult medical examination with abnormal findings: Secondary | ICD-10-CM | POA: Insufficient documentation

## 2023-04-20 DIAGNOSIS — R5383 Other fatigue: Secondary | ICD-10-CM | POA: Diagnosis not present

## 2023-04-20 DIAGNOSIS — E781 Pure hyperglyceridemia: Secondary | ICD-10-CM

## 2023-04-20 LAB — CBC WITH DIFFERENTIAL/PLATELET
Basophils Absolute: 0.1 10*3/uL (ref 0.0–0.1)
Basophils Relative: 0.8 % (ref 0.0–3.0)
Eosinophils Absolute: 0.2 10*3/uL (ref 0.0–0.7)
Eosinophils Relative: 2.5 % (ref 0.0–5.0)
HCT: 33.1 % — ABNORMAL LOW (ref 36.0–46.0)
Hemoglobin: 10.7 g/dL — ABNORMAL LOW (ref 12.0–15.0)
Lymphocytes Relative: 38.3 % (ref 12.0–46.0)
Lymphs Abs: 2.5 10*3/uL (ref 0.7–4.0)
MCHC: 32.3 g/dL (ref 30.0–36.0)
MCV: 72.3 fL — ABNORMAL LOW (ref 78.0–100.0)
Monocytes Absolute: 0.6 10*3/uL (ref 0.1–1.0)
Monocytes Relative: 8.9 % (ref 3.0–12.0)
Neutro Abs: 3.2 10*3/uL (ref 1.4–7.7)
Neutrophils Relative %: 49.5 % (ref 43.0–77.0)
Platelets: 295 10*3/uL (ref 150.0–400.0)
RBC: 4.57 Mil/uL (ref 3.87–5.11)
RDW: 14.4 % (ref 11.5–15.5)
WBC: 6.6 10*3/uL (ref 4.0–10.5)

## 2023-04-20 LAB — BASIC METABOLIC PANEL
BUN: 16 mg/dL (ref 6–23)
CO2: 28 meq/L (ref 19–32)
Calcium: 8.8 mg/dL (ref 8.4–10.5)
Chloride: 105 meq/L (ref 96–112)
Creatinine, Ser: 0.74 mg/dL (ref 0.40–1.20)
GFR: 87.22 mL/min (ref >60.00)
Glucose, Bld: 94 mg/dL (ref 70–99)
Potassium: 3.8 meq/L (ref 3.5–5.1)
Sodium: 143 meq/L (ref 135–145)

## 2023-04-20 LAB — LIPID PANEL
Cholesterol: 219 mg/dL — ABNORMAL HIGH (ref 0–200)
HDL: 42.9 mg/dL (ref >39.00)
Total CHOL/HDL Ratio: 5
Triglycerides: 835 mg/dL — ABNORMAL HIGH (ref 0.0–149.0)

## 2023-04-20 LAB — T4, FREE: Free T4: 0.53 ng/dL — ABNORMAL LOW (ref 0.60–1.60)

## 2023-04-20 LAB — TSH: TSH: 1.01 u[IU]/mL (ref 0.35–5.50)

## 2023-04-20 LAB — LDL CHOLESTEROL, DIRECT: Direct LDL: 71 mg/dL

## 2023-04-20 MED ORDER — CYANOCOBALAMIN 1000 MCG/ML IJ SOLN
1000.0000 ug | Freq: Once | INTRAMUSCULAR | Status: AC
Start: 2023-04-20 — End: 2023-04-20
  Administered 2023-04-20: 1000 ug via INTRAMUSCULAR

## 2023-04-20 MED ORDER — OMEGA-3-ACID ETHYL ESTERS 1 G PO CAPS
2.0000 g | ORAL_CAPSULE | Freq: Two times a day (BID) | ORAL | 2 refills | Status: DC
  Filled 2023-04-20: qty 120, 30d supply, fill #0

## 2023-04-20 NOTE — Progress Notes (Signed)
Complete physical exam  Patient: [name]   DOB: [birth year]   61 y.o. Female  MRN: [id number]  Subjective:    Chief Complaint  Patient presents with   Annual Exam   She is here for a complete physical exam. Followed by hematology for chronic anemia.  Closely followed by GI for diarrhea  C/o fatigue.   Working as a custodian at [employer]     B12 low - requests B12 shot'  States she is not taking a statin and is not sure why       Health Maintenance  Topic Date Due   Mammogram  10/22/2021   COVID-19 Vaccine (4 - 2023-24 season) 05/06/2023*   Lipid (cholesterol) test  04/19/2024   Colon Cancer Screening  11/11/2026   DTaP/Tdap/Td vaccine (4 - Td or Tdap) 03/02/2033   Flu Shot  Completed   Hepatitis C Screening  Completed   HIV Screening  Completed   Zoster (Shingles) Vaccine  Completed   HPV Vaccine  Aged Out  *Topic was postponed. The date shown is not the original due date.    Wears seatbelt always, smoke detectors in home and functioning, does not text while driving, feels safe in home environment.  Depression screening:    08/17/2022   10:58 AM 08/03/2022    3:22 PM 12/02/2021    3:45 PM  Depression screen PHQ 2/9  Decreased Interest 0 0 0  Down, Depressed, Hopeless 0 0 0  PHQ - 2 Score 0 0 0  Altered sleeping 2    Tired, decreased energy 2    Change in appetite 2    Feeling bad or failure about yourself  0    Trouble concentrating 0    Moving slowly or fidgety/restless 0    Suicidal thoughts 0    PHQ-9 Score 6    Difficult doing work/chores Not difficult at all     Anxiety Screening:    08/17/2022   10:58 AM 08/05/2019    4:22 PM  GAD 7 : Generalized Anxiety Score  Nervous, Anxious, on Edge 2 3  Control/stop worrying 0 2  Worry too much - different things 0 3  Trouble relaxing 0 3  Restless 0 3  Easily annoyed or irritable 0 3  Afraid - awful might happen 0 3  Total GAD 7 Score 2 20  Anxiety Difficulty Not difficult at all Extremely  difficult     Patient Active Problem List   Diagnosis Date Noted   Hypertriglyceridemia 04/20/2023   Encounter for general adult medical examination with abnormal findings 04/20/2023   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   Cough due to ACE inhibitor 05/16/2022   Myalgia 05/16/2022   Acute bronchitis 05/16/2022   Hypomagnesemia 03/19/2022   Vitamin B 12 deficiency 03/19/2022   Low TSH level 03/19/2022   Right elbow pain 03/19/2022   Encounter for screening mammogram for malignant neoplasm of breast 03/19/2022   History of hypertension 12/13/2021   History of smoking 12/13/2021   Elevated blood pressure reading 12/13/2021   Contusion of right knee 12/13/2021   Fall 12/13/2021   Muscle cramps 12/04/2021   Elevated liver enzymes 12/04/2021   Paresthesias 12/04/2021   Elevated LFTs 12/04/2021   Chronic anemia 10/20/2021   Chronic foot pain 10/20/2021   Folic acid deficiency 09/19/2021   Chronic pain syndrome 09/07/2021   Status post lumbar spine surgery for decompression of spinal cord 05/24/2021   Dyspepsia 05/23/2021   History of gastroesophageal reflux (GERD) 05/23/2021  History of lumbar laminectomy for spinal cord decompression 04/26/2021   Lumbar stenosis 04/13/2021   Tension headache 12/22/2020   Low back pain 10/20/2020   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/28/2020   Hypokalemia 12/17/2019   Chronic diarrhea 09/14/2017   History of stroke 06/12/2014   Cervical radiculitis 02/12/2014   Essential hypertension, benign 01/24/2010   Microcytic anemia 01/11/2010   Hyperlipemia 05/28/2007   Current smoker 05/28/2007   GERD 05/28/2007   Past Medical History:  Diagnosis Date   Anemia    Arthritis    "neck, hips" (04/25/2018)   Claustrophobia    Daily headache    GERD (gastroesophageal reflux disease)    Hiatal hernia    Hyperlipidemia    "hx" (04/25/2018)   Hypertension    Plantar fasciitis    hx - left foot   Stroke (HCC) 05/2014   "mini stroke"  (04/25/2018)   Past Surgical History:  Procedure Laterality Date   ABDOMINAL HYSTERECTOMY  12/09   Secondary to fibroids   ARTERY BIOPSY Right 04/10/2018   Procedure: BIOPSY TEMPORAL ARTERY;  Surgeon: Larina Earthly, MD;  Location: [facility] OR;  Service: Vascular;  Laterality: Right;   CESAREAN SECTION  1986; 1988   COLONOSCOPY     FOOT SURGERY Left 03/2017   Bone Spurs Removed   LUMBAR LAMINECTOMY/DECOMPRESSION MICRODISCECTOMY N/A 04/13/2021   Procedure: LUMBAR FOUR- LUMBAR FIVE AND LUMBAR FIVE-SACRAL ONE  DECOMPRESSION;  Surgeon: Eldred Manges, MD;  Location: [facility] OR;  Service: Orthopedics;  Laterality: N/A;   RADIOLOGY WITH ANESTHESIA N/A 04/26/2018   Procedure: MRI WITH ANESTHESIA;  Surgeon: Radiologist, Medication, MD;  Location: [facility] OR;  Service: Radiology;  Laterality: N/A;   RADIOLOGY WITH ANESTHESIA N/A 08/24/2019   Procedure: MRI WITH ANESTHESIA CERVICAL SPINE;  Surgeon: Radiologist, Medication, MD;  Location: [facility] OR;  Service: Radiology;  Laterality: N/A;   RADIOLOGY WITH ANESTHESIA N/A 10/02/2019   Procedure: MRI WITH ANESTHESIA CERVICAL WITHOUT CONTRAST;  Surgeon: Radiologist, Medication, MD;  Location: [facility] OR;  Service: Radiology;  Laterality: N/A;   TUBAL LIGATION  1988   Social History   Tobacco Use   Smoking status: Some Days    Current packs/day: 0.25    Average packs/day: 0.3 packs/day for 38.0 years (9.5 ttl pk-yrs)    Types: Cigarettes   Smokeless tobacco: Never   Tobacco comments:    1 cigs/day 08/17/2022 km,cma  Vaping Use   Vaping status: Never Used  Substance Use Topics   Alcohol use: Yes    Alcohol/week: 4.0 standard drinks of alcohol    Types: 4 Cans of beer per week   Drug use: Not Currently      Patient Care Team: Avanell Shackleton, NP-C as PCP - General (Family Medicine)   Outpatient Medications Prior to Visit  Medication Sig   amLODipine (NORVASC) 5 MG tablet Take 1 tablet (5 mg total) by mouth daily.   Cholecalciferol (VITAMIN D3) 50 MCG (2000 UT) capsule  Take 1 capsule (2,000 Units total) by mouth daily.   diclofenac Sodium (VOLTAREN) 1 % GEL Apply 2 g topically 4 (four) times daily.   diltiazem 2 % GEL Please apply 5 times daily   fluticasone (FLONASE) 50 MCG/ACT nasal spray Place 2 sprays into both nostrils daily.   folic acid (FOLVITE) 1 MG tablet Take 1 tablet (1 mg total) by mouth daily.   lidocaine (XYLOCAINE) 5 % ointment Apply a small amount inside the anal area twice daily for 4 weeks.   lipase/protease/amylase (CREON) 36000  UNITS CPEP capsule Take 3 capsules by mouth 3 times daily with meals AND 2 capsules with snacks   MAGNESIUM PO Take 1 tablet by mouth daily.   pantoprazole (PROTONIX) 40 MG tablet Take 1 tablet (40 mg total) by mouth 2 (two) times daily. Take 30 minutes before breakfast.   potassium chloride (KLOR-CON M) 10 MEQ tablet Take 1 tablet (10 mEq total) by mouth daily. Please schedule office visit for further refills.   rosuvastatin (CRESTOR) 20 MG tablet Take 1 tablet by mouth daily.   [DISCONTINUED] HYDROcodone-acetaminophen (NORCO/VICODIN) 5-325 MG tablet Take 1 tablet by mouth every 4 (four) hours as needed for moderate pain.   [DISCONTINUED] meloxicam (MOBIC) 15 MG tablet Take 1 tablet (15 mg total) by mouth daily.   [DISCONTINUED] methylPREDNISolone (MEDROL DOSEPAK) 4 MG TBPK tablet Take as directed with food (Patient not taking: Reported on 04/20/2023)   No facility-administered medications prior to visit.    Review of Systems  Constitutional:  Positive for malaise/fatigue. Negative for chills, fever and weight loss.  Eyes:  Negative for blurred vision and double vision.  Respiratory:  Negative for shortness of breath.   Cardiovascular:  Negative for chest pain, palpitations and leg swelling.  Gastrointestinal:  Negative for abdominal pain, constipation, diarrhea, nausea and vomiting.  Genitourinary:  Negative for dysuria, frequency and urgency.  Neurological:  Negative for dizziness and focal weakness.   Psychiatric/Behavioral:  Negative for depression, substance abuse and suicidal ideas. The patient is not nervous/anxious.        Objective:    BP 116/76 (BP Location: Left Arm, Patient Position: Sitting, Cuff Size: Large)   Pulse 95   Temp 97.6 F (36.4 C) (Temporal)   Ht 4\' 11"  (1.499 m)   Wt 136 lb (61.7 kg)   LMP  (LMP Unknown)   SpO2 98%   BMI 27.47 kg/m  BP Readings from Last 3 Encounters:  04/20/23 116/76  03/19/23 (!) 133/91  01/30/23 130/70   Wt Readings from Last 3 Encounters:  04/20/23 136 lb (61.7 kg)  03/19/23 135 lb (61.2 kg)  01/30/23 135 lb (61.2 kg)    Physical Exam Constitutional:      General: She is not in acute distress.    Appearance: She is not ill-appearing.  HENT:     Right Ear: Tympanic membrane and ear canal normal.     Left Ear: Tympanic membrane and ear canal normal.     Nose: Nose normal.     Mouth/Throat:     Mouth: Mucous membranes are moist.     Pharynx: Oropharynx is clear.  Eyes:     Extraocular Movements: Extraocular movements intact.     Conjunctiva/sclera: Conjunctivae normal.  Cardiovascular:     Rate and Rhythm: Normal rate and regular rhythm.  Pulmonary:     Effort: Pulmonary effort is normal.     Breath sounds: Normal breath sounds.  Abdominal:     General: Bowel sounds are normal. There is no distension.     Palpations: Abdomen is soft.     Tenderness: There is no abdominal tenderness. There is no right CVA tenderness, left CVA tenderness, guarding or rebound.  Musculoskeletal:     Cervical back: Normal range of motion and neck supple.     Right lower leg: No edema.     Left lower leg: No edema.  Skin:    General: Skin is warm and dry.  Neurological:     General: No focal deficit present.     Mental Status:  She is alert and oriented to person, place, and time.     Motor: No weakness.     Coordination: Coordination normal.     Gait: Gait normal.  Psychiatric:        Mood and Affect: Mood normal.         Behavior: Behavior normal.        Thought Content: Thought content normal.      Results for orders placed or performed in visit on 04/20/23  Lipid panel  Result Value Ref Range   Cholesterol 219 (H) 0 - 200 mg/dL   Triglycerides (H) 0.0 - 149.0 mg/dL    086.5 Triglyceride is over 400; calculations on Lipids are invalid.   HDL 42.90 >39.00 mg/dL   Total CHOL/HDL Ratio 5   T4, free  Result Value Ref Range   Free T4 0.53 (L) 0.60 - 1.60 ng/dL  TSH  Result Value Ref Range   TSH 1.01 0.35 - 5.50 uIU/mL  Basic metabolic panel  Result Value Ref Range   Sodium 143 135 - 145 mEq/L   Potassium 3.8 3.5 - 5.1 mEq/L   Chloride 105 96 - 112 mEq/L   CO2 28 19 - 32 mEq/L   Glucose, Bld 94 70 - 99 mg/dL   BUN 16 6 - 23 mg/dL   Creatinine, Ser 7.84 0.40 - 1.20 mg/dL   GFR 69.62 >95.28 mL/min   Calcium 8.8 8.4 - 10.5 mg/dL  CBC with Differential/Platelet  Result Value Ref Range   WBC 6.6 4.0 - 10.5 K/uL   RBC 4.57 3.87 - 5.11 Mil/uL   Hemoglobin 10.7 (L) 12.0 - 15.0 g/dL   HCT 41.3 (L) 24.4 - 01.0 %   MCV 72.3 (L) 78.0 - 100.0 fl   MCHC 32.3 30.0 - 36.0 g/dL   RDW 27.2 53.6 - 64.4 %   Platelets 295.0 150.0 - 400.0 K/uL   Neutrophils Relative % 49.5 43.0 - 77.0 %   Lymphocytes Relative 38.3 12.0 - 46.0 %   Monocytes Relative 8.9 3.0 - 12.0 %   Eosinophils Relative 2.5 0.0 - 5.0 %   Basophils Relative 0.8 0.0 - 3.0 %   Neutro Abs 3.2 1.4 - 7.7 K/uL   Lymphs Abs 2.5 0.7 - 4.0 K/uL   Monocytes Absolute 0.6 0.1 - 1.0 K/uL   Eosinophils Absolute 0.2 0.0 - 0.7 K/uL   Basophils Absolute 0.1 0.0 - 0.1 K/uL  LDL cholesterol, direct  Result Value Ref Range   Direct LDL 71.0 mg/dL      Assessment & Plan:    Routine Health Maintenance and Physical Exam  Problem List Items Addressed This Visit       Cardiovascular and Mediastinum   Essential hypertension, benign (Chronic)    Controlled. Continue amlodipine 5 mg daily and low sodium diet. Monitor BP at home.  Check renal function        Relevant Medications   omega-3 acid ethyl esters (LOVAZA) 1 g capsule     Other   Chronic anemia    Followed by hematologist.       Relevant Orders   CBC with Differential/Platelet (Completed)   Current smoker    Encourage smoking cessation. Aware of risks.       Encounter for general adult medical examination with abnormal findings - Primary    Preventive health care reviewed.  Counseling on healthy lifestyle including diet and exercise.  Recommend regular dental and eye exams.  Immunizations reviewed.  Discussed safety.  Hypertriglyceridemia    Severe. Lovaza prescribed. Cut back on foods high in fat. Follow up in 6 weeks.       Relevant Medications   omega-3 acid ethyl esters (LOVAZA) 1 g capsule   Other Relevant Orders   Lipid panel (Completed)   Hypokalemia    R/t chronic diarrhea. Recheck K level and continue supplement.       Relevant Orders   Basic metabolic panel (Completed)   Vitamin B 12 deficiency    Vitamin B 12 injection given today.       Other Visit Diagnoses     Screening mammogram for breast cancer       Relevant Orders   MM 3D SCREENING MAMMOGRAM BILATERAL BREAST   Fatigue, unspecified type       Relevant Orders   CBC with Differential/Platelet (Completed)   Basic metabolic panel (Completed)   TSH (Completed)   T4, free (Completed)       Return in about 4 months (around 08/21/2023) for chronic health conditions.     Hetty Blend, NP-C

## 2023-04-20 NOTE — Assessment & Plan Note (Signed)
Controlled. Continue amlodipine 5 mg daily and low sodium diet. Monitor BP at home.  Check renal function

## 2023-04-20 NOTE — Assessment & Plan Note (Signed)
Severe. Lovaza prescribed. Cut back on foods high in fat. Follow up in 6 weeks.

## 2023-04-20 NOTE — Assessment & Plan Note (Signed)
Vitamin B12 injection given today.

## 2023-04-20 NOTE — Assessment & Plan Note (Signed)
R/t chronic diarrhea. Recheck K level and continue supplement.

## 2023-04-20 NOTE — Assessment & Plan Note (Signed)
Encourage smoking cessation. Aware of risks.

## 2023-04-20 NOTE — Progress Notes (Signed)
Her triglycerides are severely elevated. I sent in a medication to lower these called Lovaza. Let me know if this medication is not affordable. Follow up in office fasting 6 weeks after taking this medication. Avoid fried foods and foods high in saturated fat.

## 2023-04-20 NOTE — Patient Instructions (Addendum)
Please go downstairs for labs.   Call and schedule your mammogram.   Call and schedule with a dentist.   Work on stopping smoking

## 2023-04-20 NOTE — Assessment & Plan Note (Signed)
Followed by hematologist 

## 2023-04-20 NOTE — Assessment & Plan Note (Signed)
Preventive health care reviewed.  Counseling on healthy lifestyle including diet and exercise.  Recommend regular dental and eye exams.  Immunizations reviewed.  Discussed safety.  

## 2023-04-22 ENCOUNTER — Ambulatory Visit (INDEPENDENT_AMBULATORY_CARE_PROVIDER_SITE_OTHER): Payer: BC Managed Care – PPO

## 2023-04-22 ENCOUNTER — Other Ambulatory Visit: Payer: Self-pay

## 2023-04-22 ENCOUNTER — Ambulatory Visit (HOSPITAL_COMMUNITY)
Admission: EM | Admit: 2023-04-22 | Discharge: 2023-04-22 | Disposition: A | Discharge status: Home / Self Care | Payer: BC Managed Care – PPO | Attending: Family Medicine | Admitting: Family Medicine

## 2023-04-22 ENCOUNTER — Encounter (HOSPITAL_COMMUNITY): Payer: Self-pay | Admitting: *Deleted

## 2023-04-22 DIAGNOSIS — J069 Acute upper respiratory infection, unspecified: Secondary | ICD-10-CM | POA: Diagnosis present

## 2023-04-22 DIAGNOSIS — Z1152 Encounter for screening for COVID-19: Secondary | ICD-10-CM | POA: Insufficient documentation

## 2023-04-22 MED ORDER — BENZONATATE 200 MG PO CAPS: 200.0000 mg | ORAL_CAPSULE | Freq: Three times a day (TID) | ORAL | 0 refills | Status: DC | PRN

## 2023-04-22 NOTE — ED Provider Notes (Signed)
[facility]    CSN: [id number] Arrival date & time: 04/22/23  1000      History   Chief Complaint Chief Complaint  Patient presents with   Cough    HPI [name] is a 62 y.o. female.    Cough Associated symptoms: chills and rhinorrhea   Associated symptoms: no fever, no shortness of breath and no wheezing    Patient is here for a cough x 1 week.  Started as a cough due to PND, but is continuing.  She started with chills last night.  Coughing so much she is having urinary leakage.  Mild runny nose.  No wheezing or sob.  No otc meds used.  No fevers.        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   Cough due to ACE inhibitor 05/16/2022   Myalgia 05/16/2022   Acute bronchitis 05/16/2022   Hypomagnesemia 03/19/2022   Vitamin B 12 deficiency 03/19/2022   Low TSH level 03/19/2022   Right elbow pain 03/19/2022   Encounter for screening mammogram for malignant neoplasm of breast 03/19/2022   History of hypertension 12/13/2021   History of smoking 12/13/2021   Elevated blood pressure reading 12/13/2021   Contusion of right knee 12/13/2021   Fall 12/13/2021   Muscle cramps 12/04/2021   Elevated liver enzymes 12/04/2021   Paresthesias 12/04/2021   Elevated LFTs 12/04/2021   Chronic anemia 10/20/2021   Chronic foot pain 10/20/2021   Folic acid deficiency 09/19/2021   Chronic pain syndrome 09/07/2021   Status post lumbar spine surgery for  decompression of spinal cord 05/24/2021   Dyspepsia 05/23/2021   History of gastroesophageal reflux (GERD) 05/23/2021   History of lumbar laminectomy for spinal cord decompression 04/26/2021   Lumbar stenosis 04/13/2021   Tension headache 12/22/2020   Low back pain 10/20/2020   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/28/2020   Hypokalemia 12/17/2019   Chronic diarrhea 09/14/2017   History of stroke 06/12/2014   Cervical radiculitis 02/12/2014   Essential hypertension, benign 01/24/2010   Microcytic anemia 01/11/2010   Hyperlipemia 05/28/2007   Current smoker 05/28/2007   GERD 05/28/2007    Past Surgical History:  Procedure Laterality Date   ABDOMINAL HYSTERECTOMY  12/09   Secondary to fibroids   ARTERY BIOPSY Right 04/10/2018   Procedure: BIOPSY TEMPORAL ARTERY;  Surgeon: Larina Earthly, MD;  Location: [facility] OR;  Service: Vascular;  Laterality: Right;   CESAREAN SECTION  1986; 1988   COLONOSCOPY     FOOT SURGERY Left 03/2017   Bone Spurs Removed   LUMBAR LAMINECTOMY/DECOMPRESSION MICRODISCECTOMY N/A 04/13/2021   Procedure: LUMBAR FOUR- LUMBAR FIVE AND LUMBAR FIVE-SACRAL ONE  DECOMPRESSION;  Surgeon: Eldred Manges, MD;  Location: [facility] OR;  Service: Orthopedics;  Laterality: N/A;   RADIOLOGY WITH ANESTHESIA N/A 04/26/2018   Procedure: MRI WITH ANESTHESIA;  Surgeon: Radiologist, Medication, MD;  Location: [facility] OR;  Service:  Radiology;  Laterality: N/A;   RADIOLOGY WITH ANESTHESIA N/A 08/24/2019   Procedure: MRI WITH ANESTHESIA CERVICAL SPINE;  Surgeon: Radiologist, Medication, MD;  Location: [facility] OR;  Service: Radiology;  Laterality: N/A;   RADIOLOGY WITH ANESTHESIA N/A 10/02/2019   Procedure: MRI WITH ANESTHESIA CERVICAL WITHOUT CONTRAST;  Surgeon: Radiologist, Medication, MD;  Location: [facility] OR;  Service: Radiology;  Laterality: N/A;   TUBAL LIGATION  1988    OB History   No obstetric history on file.      Home Medications    Prior to Admission medications   Medication Sig Start Date  End Date Taking? Authorizing Provider  amLODipine (NORVASC) 5 MG tablet Take 1 tablet (5 mg total) by mouth daily. 04/17/23  Yes Henson, Vickie L, NP-C  diclofenac Sodium (VOLTAREN) 1 % GEL Apply 2 g topically 4 (four) times daily. 03/15/22  Yes Henson, Vickie L, NP-C  diltiazem 2 % GEL Please apply 5 times daily 10/05/22  Yes Hilarie Fredrickson, MD  folic acid (FOLVITE) 1 MG tablet Take 1 tablet (1 mg total) by mouth daily. 01/30/23  Yes Heilingoetter, Cassandra L, PA-C  lidocaine (XYLOCAINE) 5 % ointment Apply a small amount inside the anal area twice daily for 4 weeks. 01/30/23  Yes Arnaldo Natal, NP  lipase/protease/amylase (CREON) 36000 UNITS CPEP capsule Take 3 capsules by mouth 3 times daily with meals AND 2 capsules with snacks 10/05/22  Yes Hilarie Fredrickson, MD  pantoprazole (PROTONIX) 40 MG tablet Take 1 tablet (40 mg total) by mouth 2 (two) times daily. Take 30 minutes before breakfast. 03/26/23  Yes Kennedy-Smith, Malachi Carl, NP  potassium chloride (KLOR-CON M) 10 MEQ tablet Take 1 tablet (10 mEq total) by mouth daily. Please schedule office visit for further refills. 02/23/23  Yes Henson, Vickie L, NP-C  Cholecalciferol (VITAMIN D3) 50 MCG (2000 UT) capsule Take 1 capsule (2,000 Units total) by mouth daily. 05/16/22   Plotnikov, Georgina Quint, MD  fluticasone (FLONASE) 50 MCG/ACT nasal spray Place 2 sprays into both nostrils daily. 11/02/21   Henson, Vickie L, NP-C  MAGNESIUM PO Take 1 tablet by mouth daily.    [provider]  omega-3 acid ethyl esters (LOVAZA) 1 g capsule Take 2 capsules (2 g total) by mouth 2 (two) times daily. 04/20/23   Henson, Vickie L, NP-C  rosuvastatin (CRESTOR) 20 MG tablet Take 1 tablet by mouth daily. 12/19/22   Avanell Shackleton, NP-C    Family History Family History  Problem Relation Age of Onset   Pancreatic cancer Mother 14   Heart disease Mother    Esophageal cancer Father 42   Heart disease Sister    Breast cancer Maternal Aunt 60   Colon cancer  Maternal Uncle    Pancreatic cancer Maternal Uncle    Colon cancer Paternal Uncle    Rectal cancer Neg Hx    Stomach cancer Neg Hx     Social History Social History   Tobacco Use   Smoking status: Some Days    Current packs/day: 0.25    Average packs/day: 0.3 packs/day for 38.0 years (9.5 ttl pk-yrs)    Types: Cigarettes   Smokeless tobacco: Never   Tobacco comments:    1 cigs/day 08/17/2022 km,cma  Vaping Use   Vaping status: Never Used  Substance Use Topics   Alcohol use: Yes    Alcohol/week: 4.0 standard drinks of alcohol    Types: 4 Cans of beer per week   Drug use: Not Currently  Allergies   Aspirin, Hydromorphone hcl, Crestor [rosuvastatin], and Latex   Review of Systems Review of Systems  Constitutional:  Positive for chills. Negative for fever.  HENT:  Positive for rhinorrhea.   Respiratory:  Positive for cough. Negative for shortness of breath and wheezing.   Cardiovascular: Negative.   Gastrointestinal: Negative.   Musculoskeletal: Negative.   Psychiatric/Behavioral: Negative.       Physical Exam Triage Vital Signs ED Triage Vitals  Encounter Vitals Group     BP 04/22/23 1019 (!) 154/94     Systolic BP Percentile --      Diastolic BP Percentile --      Pulse Rate 04/22/23 1019 (!) 102     Resp 04/22/23 1019 20     Temp 04/22/23 1019 98.6 F (37 C)     Temp src --      SpO2 04/22/23 1019 97 %     Weight --      Height --      Head Circumference --      Peak Flow --      Pain Score 04/22/23 1015 0     Pain Loc --      Pain Education --      Exclude from Growth Chart --    No data found.  Updated Vital Signs BP (!) 154/94   Pulse (!) 102   Temp 98.6 F (37 C)   Resp 20   LMP  (LMP Unknown)   SpO2 97%   Visual Acuity Right Eye Distance:   Left Eye Distance:   Bilateral Distance:    Right Eye Near:   Left Eye Near:    Bilateral Near:     Physical Exam Constitutional:      Appearance: Normal appearance.  HENT:     Nose:  Rhinorrhea present. No congestion.     Mouth/Throat:     Mouth: Mucous membranes are moist.  Cardiovascular:     Rate and Rhythm: Normal rate and regular rhythm.  Pulmonary:     Effort: Pulmonary effort is normal.     Breath sounds: No wheezing or rhonchi.  Musculoskeletal:     Cervical back: Normal range of motion and neck supple.  Skin:    General: Skin is warm.  Neurological:     General: No focal deficit present.     Mental Status: She is alert.  Psychiatric:        Mood and Affect: Mood normal.      UC Treatments / Results  Labs (all labs ordered are listed, but only abnormal results are displayed) Labs Reviewed  SARS CORONAVIRUS 2 (TAT 6-24 HRS)    EKG   Radiology DG Chest 2 View  Result Date: 04/22/2023 CLINICAL DATA:  Cough, chills. EXAM: CHEST - 2 VIEW COMPARISON:  09/05/2022 FINDINGS: The heart size and mediastinal contours are within normal limits. Both lungs are clear. The visualized skeletal structures are unremarkable. IMPRESSION: No active cardiopulmonary disease. Electronically Signed   By: Signa Kell M.D.   On: 04/22/2023 10:57    Procedures Procedures (including critical care time)  Medications Ordered in UC Medications - No data to display  Initial Impression / Assessment and Plan / UC Course  I have reviewed the triage vital signs and the nursing notes.  Pertinent labs & imaging results that were available during my care of the patient were reviewed by me and considered in my medical decision making (see chart for details).    Final Clinical Impressions(s) /  UC Diagnoses   Final diagnoses:  Viral URI with cough     Discharge Instructions      You were seen today for upper respiratory symptoms.  Your xray was read as normal.  This is likely viral in nature.  We have swabbed you for covid and this will be resulted tomorrow.  I have sent out a medication to help with the cough.  Please get plenty of rest and fluids.  Return if not  improving or worsening.      ED Prescriptions     Medication Sig Dispense Auth. Provider   benzonatate (TESSALON) 200 MG capsule Take 1 capsule (200 mg total) by mouth 3 (three) times daily as needed for cough. 21 capsule Jannifer Franklin, MD      PDMP not reviewed this encounter.   Jannifer Franklin, MD 04/22/23 1106

## 2023-04-22 NOTE — ED Triage Notes (Signed)
PT reports a cough that started last week. Pt reports urinary incontinence while coughing.Pt also has had chills.

## 2023-04-22 NOTE — Discharge Instructions (Addendum)
You were seen today for upper respiratory symptoms.  Your xray was read as normal.  This is likely viral in nature.  We have swabbed you for covid and this will be resulted tomorrow.  I have sent out a medication to help with the cough.  Please get plenty of rest and fluids.  Return if not improving or worsening.

## 2023-04-23 ENCOUNTER — Telehealth (HOSPITAL_COMMUNITY): Payer: Self-pay

## 2023-04-23 ENCOUNTER — Telehealth: Payer: Self-pay | Admitting: Family Medicine

## 2023-04-23 ENCOUNTER — Other Ambulatory Visit: Payer: Self-pay | Admitting: Family Medicine

## 2023-04-23 ENCOUNTER — Other Ambulatory Visit (HOSPITAL_COMMUNITY): Payer: Self-pay

## 2023-04-23 DIAGNOSIS — E781 Pure hyperglyceridemia: Secondary | ICD-10-CM

## 2023-04-23 LAB — SARS CORONAVIRUS 2 (TAT 6-24 HRS): SARS Coronavirus 2: NEGATIVE

## 2023-04-23 MED ORDER — OMEGA-3-ACID ETHYL ESTERS 1 G PO CAPS: 2.0000 g | ORAL_CAPSULE | Freq: Two times a day (BID) | ORAL | 1 refills | Status: DC

## 2023-04-23 NOTE — Telephone Encounter (Signed)
Called pt and she reports she went to urgent care as she suddenly felt so much worse the last 2 days with nasal congestion and runny nose as well as a cough that is leaving her abdomen and chest sore from all the coughing. UC prescribed tessalon pearls and pt reports that they are not working and may be making an appt w Vickie if things dont clear up. UC tested her and reports this is just a viral illness

## 2023-04-23 NOTE — Telephone Encounter (Signed)
Pt called because she wanted to speak with her nurse. She stated to me that she is back in the Urgent Care not feeling well at all and she need some type of medication or what to do to get better.. I mentioned a appointment but pt wants to speak with her nurse. Please advise.  Thanks

## 2023-04-23 NOTE — ED Notes (Signed)
Patient came in for an extension on her doctor note.  Staff got permission from [place]. [name] to extended her not till 04/25/2023.

## 2023-04-24 ENCOUNTER — Other Ambulatory Visit (HOSPITAL_COMMUNITY): Payer: Self-pay

## 2023-04-25 ENCOUNTER — Ambulatory Visit (INDEPENDENT_AMBULATORY_CARE_PROVIDER_SITE_OTHER): Payer: BC Managed Care – PPO

## 2023-04-25 ENCOUNTER — Ambulatory Visit: Payer: BC Managed Care – PPO | Admitting: Nurse Practitioner

## 2023-04-25 ENCOUNTER — Encounter: Payer: Self-pay | Admitting: Nurse Practitioner

## 2023-04-25 VITALS — BP 130/80 | HR 95 | Temp 97.9°F | Ht 59.0 in | Wt 135.4 lb

## 2023-04-25 DIAGNOSIS — R059 Cough, unspecified: Secondary | ICD-10-CM | POA: Diagnosis not present

## 2023-04-25 LAB — POCT INFLUENZA A/B

## 2023-04-25 LAB — POCT RAPID STREP A (OFFICE): Rapid Strep A Screen: NEGATIVE

## 2023-04-25 MED ORDER — ALBUTEROL SULFATE HFA 108 (90 BASE) MCG/ACT IN AERS: 2.0000 | INHALATION_SPRAY | Freq: Four times a day (QID) | RESPIRATORY_TRACT | 0 refills | Status: DC | PRN

## 2023-04-25 MED ORDER — PREDNISONE 20 MG PO TABS: 40.0000 mg | ORAL_TABLET | Freq: Every day | ORAL | 0 refills | Status: DC

## 2023-04-25 MED ORDER — PROMETHAZINE-DM 6.25-15 MG/5ML PO SYRP: 5.0000 mL | ORAL_SOLUTION | Freq: Three times a day (TID) | ORAL | 0 refills | Status: DC | PRN

## 2023-04-25 MED ORDER — ONDANSETRON HCL 4 MG PO TABS
4.0000 mg | ORAL_TABLET | Freq: Three times a day (TID) | ORAL | 0 refills | Status: DC | PRN
Start: 2023-04-25 — End: 2023-08-06

## 2023-04-25 NOTE — Patient Instructions (Addendum)
Cough Syrup: take by mouth every 8 hours as needed for cough.  Do not drive or operate heavy machinery while taking the medication.  Prednisone: Take 2 tablets by mouth every morning with your first meal.  If you need something for fever or pain take Tylenol only until you have completed prednisone.  If you experience any significant abdominal pain, blood in stool, or black sticky looking stools stop the prednisone and call your primary care provider  Albuterol inhaler: Take 2 puffs into the lungs every 6 hours as needed for wheezing  Ondansetron: You can take 1 tablet every 8 hours as needed for nausea or vomiting

## 2023-04-25 NOTE — Addendum Note (Signed)
Addended by: Cathleen Fears, Jashiya Bassett P on: 04/25/2023 04:54 PM   Modules accepted: Orders

## 2023-04-25 NOTE — Assessment & Plan Note (Addendum)
Acute Previous COVID test negative.  Point-of-care RSV and flu today: negative.  Attempted strep test but patient declined due to gagging and concern about vomiting. Likely viral in origin, treat symptoms with promethazine dexamethasone and cough syrup, albuterol inhaler, prednisone burst, and Zofran if she does feel nauseated. Will check chest x-ray, if pneumonia identified will treat with antibiotics. Patient to follow-up if symptoms do not improve within the next 7 to 10 days

## 2023-04-25 NOTE — Progress Notes (Signed)
Established Patient Office Visit  Subjective   Patient ID: [name], female    DOB: 1961/02/03  Age: 62 y.o. MRN: [id number]  Chief Complaint  Patient presents with   Cough    Patient has for acute visit for the above.  Symptom onset approximately 5 days ago.  She has been seen in this office as well as an urgent care.  Has been tested for COVID which was negative.  Reports that she continues to have frequent cough, some production.  When she coughs she will cough multiple times which often will end up gagging her and causing her to vomit.  She denies actual nausea.  Does feel some chest pain with deep breathing's but otherwise no chest pain.  Also has a headache.  No fevers, but has had chills  Was prescribed Tessalon Perles at urgent care but reports this has not helped with her cough.    Review of Systems  Constitutional:  Positive for chills. Negative for fever.  Respiratory:  Positive for cough, sputum production, shortness of breath and wheezing.   Cardiovascular:  Negative for chest pain and palpitations.  Gastrointestinal:  Positive for vomiting.  Neurological:  Positive for headaches.      Objective:     BP 130/80   Pulse 95   Temp 97.9 F (36.6 C) (Temporal)   Ht 4\' 11"  (1.499 m)   Wt 135 lb 6 oz (61.4 kg)   LMP  (LMP Unknown)   SpO2 99%   BMI 27.34 kg/m  BP Readings from Last 3 Encounters:  04/25/23 130/80  04/22/23 (!) 154/94  04/20/23 116/76   Wt Readings from Last 3 Encounters:  04/25/23 135 lb 6 oz (61.4 kg)  04/20/23 136 lb (61.7 kg)  03/19/23 135 lb (61.2 kg)      Physical Exam Vitals reviewed.  Constitutional:      General: She is not in acute distress.    Appearance: Normal appearance.  HENT:     Head: Normocephalic and atraumatic.  Cardiovascular:     Rate and Rhythm: Normal rate and regular rhythm.     Pulses: Normal pulses.     Heart sounds: Normal heart sounds.  Pulmonary:     Effort: Pulmonary effort is normal.      Breath sounds: Normal breath sounds.  Skin:    General: Skin is warm and dry.  Neurological:     General: No focal deficit present.     Mental Status: She is alert and oriented to person, place, and time.  Psychiatric:        Mood and Affect: Mood normal.        Behavior: Behavior normal.        Judgment: Judgment normal.     Estimated Creatinine Clearance: 58.9 mL/min (by C-G formula based on SCr of 0.74 mg/dL).  No results found for any visits on 04/25/23.    The ASCVD Risk score (Arnett DK, et al., 2019) failed to calculate for the following reasons:   The patient has a prior MI or stroke diagnosis    Assessment & Plan:   Problem List Items Addressed This Visit       Other   Cough - Primary    Acute Previous COVID test negative.  Point-of-care RSV and flu today: negative.  Attempted strep test but patient declined due to gagging and concern about vomiting. Likely viral in origin, treat symptoms with promethazine dexamethasone and cough syrup, albuterol inhaler, prednisone burst, and Zofran if  she does feel nauseated. Will check chest x-ray, if pneumonia identified will treat with antibiotics. Patient to follow-up if symptoms do not improve within the next 7 to 10 days      Relevant Medications   promethazine-dextromethorphan (PROMETHAZINE-DM) 6.25-15 MG/5ML syrup   albuterol (VENTOLIN HFA) 108 (90 Base) MCG/ACT inhaler   predniSONE (DELTASONE) 20 MG tablet   ondansetron (ZOFRAN) 4 MG tablet   Other Relevant Orders   DG Chest 2 View    Return in about 1 week (around 05/02/2023) for with PCP.    Elenore Paddy, NP

## 2023-04-26 ENCOUNTER — Other Ambulatory Visit (HOSPITAL_COMMUNITY): Payer: Self-pay

## 2023-04-26 ENCOUNTER — Telehealth: Payer: Self-pay | Admitting: Pharmacist

## 2023-04-26 NOTE — Telephone Encounter (Signed)
Pharmacy Patient Advocate Encounter   Received notification from Physician's Office that prior authorization for Omega-3-acid Ethyl Esters 1GM capsules is required/requested.   Insurance verification completed.   The patient is insured through [facility] .   Per test claim: PA required; PA submitted to above mentioned insurance via CoverMyMeds Key/confirmation #/EOC [id number] Status is pending

## 2023-04-26 NOTE — Telephone Encounter (Signed)
Pharmacy Patient Advocate Encounter  Received notification from [facility] that Prior Authorization for Omega-3-acid Ethyl Esters 1GM capsules has been APPROVED from 04/26/2023 to 04/25/2024. Ran test claim, Copay is $5.00. This test claim was processed through [facility]- copay amounts may vary at other pharmacies due to pharmacy/plan contracts, or as the patient moves through the different stages of their insurance plan.   PA #/Case ID/Reference #: [id number]

## 2023-05-02 ENCOUNTER — Ambulatory Visit: Payer: BC Managed Care – PPO | Admitting: Family Medicine

## 2023-05-02 ENCOUNTER — Encounter: Payer: Self-pay | Admitting: Family Medicine

## 2023-05-02 VITALS — BP 118/82 | HR 94 | Temp 97.8°F | Ht 59.0 in | Wt 138.0 lb

## 2023-05-02 DIAGNOSIS — R062 Wheezing: Secondary | ICD-10-CM

## 2023-05-02 DIAGNOSIS — J22 Unspecified acute lower respiratory infection: Secondary | ICD-10-CM

## 2023-05-02 MED ORDER — AMOXICILLIN-POT CLAVULANATE 875-125 MG PO TABS: 1.0000 | ORAL_TABLET | Freq: Two times a day (BID) | ORAL | 0 refills | Status: DC

## 2023-05-02 NOTE — Progress Notes (Signed)
Subjective:  [name] is a 62 y.o. female who presents for cough and wheezing. States she has been coughing for 2 weeks.  She was seen on 04/25/2023 and negative for Covid, flu, RSV   Completed course of steroids. Taking Tessalon perles which do not work.  Nighttime cough better with promethazine-DM  Using albuterol. Has not used it today.   No longer gagging and vomiting.    ROS as in subjective.   Objective: Vitals:   05/02/23 1255  BP: 118/82  Pulse: 94  Temp: 97.8 F (36.6 C)  SpO2: 98%    General appearance: Alert, WD/WN, no distress                             Skin: warm, no rash                           Head: no sinus tenderness                            Eyes: conjunctiva normal, corneas clear, PERRLA                            Ears: pearly TMs, external ear canals normal                          Nose: septum midline, turbinates swollen, with erythema and clear discharge             Mouth/throat: MMM, tongue normal, mild pharyngeal erythema                           Neck: supple, no adenopathy, no thyromegaly, nontender                          Heart: RRR                         Lungs: + exp wheezes LLL, no rales, or rhonchi      Assessment: Lower respiratory infection - Plan: amoxicillin-clavulanate (AUGMENTIN) 875-125 MG tablet  Wheezing on exhalation   Plan: Reviewed previous notes and chest X ray.  Augmentin prescribed. Continue symptomatic treatment.  Follow up if worsening or not back to baseline when she completes the antibiotic.

## 2023-05-02 NOTE — Patient Instructions (Signed)
Take the antibiotic as prescribed with food.   Stay hydrated.   Use your albuterol inhaler as needed for chest tightness, wheezing and shortness of breath.   Follow up with me if you are getting worse or have any new symptoms or if you are not improving in the next 4-5 days.

## 2023-05-03 ENCOUNTER — Ambulatory Visit: Payer: BC Managed Care – PPO | Admitting: Nurse Practitioner

## 2023-05-04 ENCOUNTER — Telehealth: Payer: Self-pay | Admitting: Family Medicine

## 2023-05-04 NOTE — Telephone Encounter (Signed)
Pt called back wanting Hetty Blend know that she do not feel any better. Pt has been experiencing diarrhea and vomiting and lighting of the chest. Please reach out to pt.  Please update pt.

## 2023-05-08 ENCOUNTER — Other Ambulatory Visit (HOSPITAL_COMMUNITY): Payer: Self-pay

## 2023-05-14 ENCOUNTER — Other Ambulatory Visit (HOSPITAL_COMMUNITY): Payer: Self-pay

## 2023-05-14 ENCOUNTER — Other Ambulatory Visit: Payer: Self-pay | Admitting: Family Medicine

## 2023-05-14 ENCOUNTER — Other Ambulatory Visit: Payer: Self-pay

## 2023-05-14 DIAGNOSIS — E538 Deficiency of other specified B group vitamins: Secondary | ICD-10-CM

## 2023-05-14 MED ORDER — FOLIC ACID 1 MG PO TABS
1.0000 mg | ORAL_TABLET | Freq: Every day | ORAL | 2 refills | Status: DC
  Filled 2023-05-14: qty 30, 30d supply, fill #0
  Filled 2023-06-04 – 2023-06-06 (×2): qty 30, 30d supply, fill #1
  Filled 2023-07-09: qty 30, 30d supply, fill #2

## 2023-05-14 NOTE — Telephone Encounter (Signed)
1st fill w you.. ok to refill?

## 2023-05-17 ENCOUNTER — Ambulatory Visit: Payer: BC Managed Care – PPO | Admitting: Internal Medicine

## 2023-05-17 ENCOUNTER — Encounter: Payer: Self-pay | Admitting: Internal Medicine

## 2023-05-17 ENCOUNTER — Ambulatory Visit: Payer: BC Managed Care – PPO

## 2023-05-17 VITALS — BP 134/82 | HR 75 | Temp 97.8°F | Ht 59.0 in | Wt 139.0 lb

## 2023-05-17 DIAGNOSIS — M79672 Pain in left foot: Secondary | ICD-10-CM

## 2023-05-17 DIAGNOSIS — F17200 Nicotine dependence, unspecified, uncomplicated: Secondary | ICD-10-CM | POA: Diagnosis not present

## 2023-05-17 DIAGNOSIS — I10 Essential (primary) hypertension: Secondary | ICD-10-CM

## 2023-05-17 MED ORDER — MELOXICAM 15 MG PO TABS: 15.0000 mg | ORAL_TABLET | Freq: Every day | ORAL | 2 refills | Status: DC | PRN

## 2023-05-17 NOTE — Assessment & Plan Note (Signed)
Pt counsled to quit, pt not ready °

## 2023-05-17 NOTE — Progress Notes (Signed)
Patient ID: [name], female   DOB: 1960-12-02, 62 y.o.   MRN: [id number]        Chief Complaint: follow up left foot pain x 2 wks, smoker, htn       HPI:  [name] is a 62 y.o. 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  No other truama or falls.  No prior hx. No fever or skin change or ulcer.  Pt denies chest pain, increased sob or doe, wheezing, orthopnea, PND, increased LE swelling, palpitations, dizziness or syncope.   Pt denies polydipsia, polyuria, or new focal neuro s/s.  ACEI has recently been stopped.  Still smoking, not ready to quit       Wt Readings from Last 3 Encounters:  05/17/23 139 lb (63 kg)  05/02/23 138 lb (62.6 kg)  04/25/23 135 lb 6 oz (61.4 kg)   BP Readings from Last 3 Encounters:  05/17/23 134/82  05/02/23 118/82  04/25/23 130/80         Past Medical History:  Diagnosis Date   Anemia    Arthritis    "neck, hips" (04/25/2018)   Claustrophobia    Daily headache    GERD (gastroesophageal reflux disease)    Hiatal hernia    Hyperlipidemia    "hx" (04/25/2018)   Hypertension    Plantar fasciitis    hx - left foot   Stroke (HCC) 05/2014   "mini stroke" (04/25/2018)   Past Surgical History:  Procedure Laterality Date   ABDOMINAL HYSTERECTOMY  12/09   Secondary to fibroids   ARTERY BIOPSY Right 04/10/2018   Procedure: BIOPSY TEMPORAL ARTERY;  Surgeon: Larina Earthly, MD;  Location: [facility];  Service: Vascular;  Laterality: Right;   CESAREAN SECTION  1986; 1988   COLONOSCOPY     FOOT SURGERY Left 03/2017   Bone Spurs Removed   LUMBAR LAMINECTOMY/DECOMPRESSION MICRODISCECTOMY N/A 04/13/2021   Procedure: LUMBAR FOUR- LUMBAR FIVE AND LUMBAR FIVE-SACRAL ONE  DECOMPRESSION;  Surgeon: Eldred Manges, MD;  Location:  [facility] OR;  Service: Orthopedics;  Laterality: N/A;   RADIOLOGY WITH ANESTHESIA N/A 04/26/2018   Procedure: MRI WITH ANESTHESIA;  Surgeon: Radiologist, Medication, MD;  Location: [facility];  Service: Radiology;  Laterality: N/A;   RADIOLOGY WITH ANESTHESIA N/A 08/24/2019   Procedure: MRI WITH ANESTHESIA CERVICAL SPINE;  Surgeon: Radiologist, Medication, MD;  Location: [facility];  Service: Radiology;  Laterality: N/A;   RADIOLOGY WITH ANESTHESIA N/A 10/02/2019   Procedure: MRI WITH ANESTHESIA CERVICAL WITHOUT CONTRAST;  Surgeon: Radiologist, Medication, MD;  Location: [facility];  Service: Radiology;  Laterality: N/A;   TUBAL LIGATION  1988    reports that she has been smoking cigarettes. She has a 9.5 pack-year smoking history. She has never used smokeless tobacco. She reports current alcohol use of about 4.0 standard drinks of alcohol per week. She reports that she does not currently use drugs. family history includes Breast cancer (age of onset: 23) in her maternal aunt; Colon cancer in her maternal uncle and paternal uncle; Esophageal cancer (age of onset: 58) in her father; Heart disease in her mother and sister; Pancreatic cancer in her maternal uncle; Pancreatic cancer (age of onset: 54) in her mother. Allergies  Allergen Reactions  Aspirin Hives   Hydromorphone Hcl Hives and Nausea And Vomiting    Can take Norco   Crestor [Rosuvastatin]     myalgias   Latex Rash   Current Outpatient Medications on File Prior to Visit  Medication Sig Dispense Refill   albuterol (VENTOLIN HFA) 108 (90 Base) MCG/ACT inhaler Inhale 2 puffs into the lungs every 6 (six) hours as needed for wheezing or shortness of breath. 8 g 0   amLODipine (NORVASC) 5 MG tablet Take 1 tablet (5 mg total) by mouth daily. 90 tablet 0   amoxicillin-clavulanate (AUGMENTIN) 875-125 MG tablet Take 1 tablet by mouth 2 (two) times daily. 20 tablet 0   diclofenac Sodium (VOLTAREN) 1 % GEL Apply 2 g topically 4 (four) times daily. 200 g 1   diltiazem  2 % GEL Please apply 5 times daily 30 g 1   folic acid (FOLVITE) 1 MG tablet Take 1 tablet (1 mg total) by mouth daily. 30 tablet 2   lidocaine (XYLOCAINE) 5 % ointment Apply a small amount inside the anal area twice daily for 4 weeks. 35.44 g 0   lipase/protease/amylase (CREON) 36000 UNITS CPEP capsule Take 3 capsules by mouth 3 times daily with meals AND 2 capsules with snacks 390 capsule 11   omega-3 acid ethyl esters (LOVAZA) 1 g capsule Take 2 capsules (2 g total) by mouth 2 (two) times daily. 120 capsule 1   ondansetron (ZOFRAN) 4 MG tablet Take 1 tablet (4 mg total) by mouth every 8 (eight) hours as needed for nausea or vomiting. 20 tablet 0   pantoprazole (PROTONIX) 40 MG tablet Take 1 tablet (40 mg total) by mouth 2 (two) times daily. Take 30 minutes before breakfast. 60 tablet 2   potassium chloride (KLOR-CON M) 10 MEQ tablet Take 1 tablet (10 mEq total) by mouth daily. Please schedule office visit for further refills. 30 tablet 2   No current facility-administered medications on file prior to visit.        ROS:  All others reviewed and negative.  Objective        PE:  BP 134/82   Pulse 75   Temp 97.8 F (36.6 C) (Temporal)   Ht 4\' 11"  (1.499 m)   Wt 139 lb (63 kg)   LMP  (LMP Unknown)   SpO2 98%   BMI 28.07 kg/m                 Constitutional: Pt appears in NAD               HENT: Head: NCAT.                Right Ear: External ear normal.                 Left Ear: External ear normal.                Eyes: . Pupils are equal, round, and reactive to light. Conjunctivae and EOM are normal               Nose: without d/c or deformity               Neck: Neck supple. Gross normal ROM               Cardiovascular: Normal rate and regular rhythm.                 Pulmonary/Chest: Effort normal and breath sounds without rales or wheezing.  Abd:  Soft, NT, ND, + BS, no organomegaly               Neurological: Pt is alert. At baseline orientation, motor grossly  intact               Skin: Skin is warm. LE edema - none, left foot with dorsal lateral midfoot and 5th toe 1-2+ swelling without skin change or ulcer, o/w neurovasc intact               Psychiatric: Pt behavior is normal without agitation   Micro: none  Cardiac tracings I have personally interpreted today:  none  Pertinent Radiological findings (summarize): none   Lab Results  Component Value Date   WBC 6.6 04/20/2023   HGB 10.7 (L) 04/20/2023   HCT 33.1 (L) 04/20/2023   PLT 295.0 04/20/2023   GLUCOSE 94 04/20/2023   CHOL 219 (H) 04/20/2023   TRIG (H) 04/20/2023    835.0 Triglyceride is over 400; calculations on Lipids are invalid.   HDL 42.90 04/20/2023   LDLDIRECT 71.0 04/20/2023   LDLCALC 43 10/20/2021   ALT 44 03/19/2023   AST 28 03/19/2023   NA 143 04/20/2023   K 3.8 04/20/2023   CL 105 04/20/2023   CREATININE 0.74 04/20/2023   BUN 16 04/20/2023   CO2 28 04/20/2023   TSH 1.01 04/20/2023   INR 0.96 06/12/2014   HGBA1C 5.9 10/20/2021   Assessment/Plan:  [name] is a 62 y.o. Black or African American [2] female with  has a past medical history of Anemia, Arthritis, Claustrophobia, Daily headache, GERD (gastroesophageal reflux disease), Hiatal hernia, Hyperlipidemia, Hypertension, Plantar fasciitis, and Stroke (HCC) (05/2014).  Essential hypertension, benign BP Readings from Last 3 Encounters:  05/17/23 134/82  05/02/23 118/82  04/25/23 130/80   Stable, pt to continue medical treatment norvasc 5 qd   Current smoker Pt counsled to quit, pt not ready  Left foot pain With possible fx vs worsening strain, for urgent xray, mobic 15 every day prn, consider ortho for any fx on xray, also work note given  Followup: Return if symptoms worsen or fail to improve.  Oliver Barre, MD 05/17/2023 4:16 PM [facility]

## 2023-05-17 NOTE — Patient Instructions (Addendum)
Please take all new medication as prescribed - the anti-inflammatory for pain and swelling  Please continue all other medications as before, and refills have been done if requested.  Please have the pharmacy call with any other refills you may need.  Please keep your appointments with your specialists as you may have planned  You are given the work note  Please go to the XRAY Department in the first floor for the x-ray testing  You will be contacted by phone if any changes need to be made immediately.  Otherwise, you will receive a letter about your results with an explanation, but please check with MyChart first.

## 2023-05-17 NOTE — Assessment & Plan Note (Signed)
With possible fx vs worsening strain, for urgent xray, mobic 15 every day prn, consider ortho for any fx on xray, also work note given

## 2023-05-17 NOTE — Assessment & Plan Note (Signed)
BP Readings from Last 3 Encounters:  05/17/23 134/82  05/02/23 118/82  04/25/23 130/80   Stable, pt to continue medical treatment norvasc 5 qd

## 2023-05-18 ENCOUNTER — Telehealth: Payer: Self-pay | Admitting: Family Medicine

## 2023-05-18 NOTE — Telephone Encounter (Signed)
Pt called stating that her pinky toe is swollen and she can't get her foot in her shoe.

## 2023-05-21 MED ORDER — CEPHALEXIN 500 MG PO CAPS: 500.0000 mg | ORAL_CAPSULE | Freq: Three times a day (TID) | ORAL | 0 refills | Status: AC

## 2023-05-21 NOTE — Telephone Encounter (Signed)
Please advise, pt was just seen by you Thursday for this

## 2023-05-21 NOTE — Addendum Note (Signed)
Addended by: Corwin Levins on: 05/21/2023 09:38 AM   Modules accepted: Orders

## 2023-05-21 NOTE — Telephone Encounter (Signed)
Called pt and relayed that abx was sent in to try and help w the swelling, pt verbalized understanding

## 2023-05-21 NOTE — Telephone Encounter (Signed)
Ok to try to add an antibiotic - I will send

## 2023-05-21 NOTE — Telephone Encounter (Signed)
See below- thanks!

## 2023-05-22 NOTE — Telephone Encounter (Signed)
Pt can also add tylenol otc prn, and continue the antibiotix and mobic prn - o/w I dont have anything else to add.   thanks

## 2023-05-22 NOTE — Telephone Encounter (Signed)
Patient called and said she has started the antibiotic. However, her foot is in a lot of pain. She said it is throbbing. Patient would like to know what would be advised. Best callback is [phone].

## 2023-05-23 NOTE — Telephone Encounter (Signed)
Called and left voicemail, if Pt calls back please relay Dr.John message.

## 2023-05-30 NOTE — Telephone Encounter (Signed)
Pt called ankle is still swollen and pinky toe is still in alot pain. Please advise.

## 2023-06-04 ENCOUNTER — Other Ambulatory Visit: Payer: Self-pay | Admitting: Family Medicine

## 2023-06-04 ENCOUNTER — Other Ambulatory Visit: Payer: Self-pay

## 2023-06-04 ENCOUNTER — Other Ambulatory Visit (HOSPITAL_COMMUNITY): Payer: Self-pay

## 2023-06-04 DIAGNOSIS — I10 Essential (primary) hypertension: Secondary | ICD-10-CM

## 2023-06-04 MED ORDER — AMLODIPINE BESYLATE 5 MG PO TABS
5.0000 mg | ORAL_TABLET | Freq: Every day | ORAL | 1 refills | Status: DC
  Filled 2023-06-04 – 2023-07-02 (×3): qty 90, 90d supply, fill #0
  Filled 2023-10-22 – 2023-11-05 (×2): qty 90, 90d supply, fill #1

## 2023-06-04 MED ORDER — POTASSIUM CHLORIDE CRYS ER 10 MEQ PO TBCR
10.0000 meq | EXTENDED_RELEASE_TABLET | Freq: Every day | ORAL | 2 refills | Status: DC
  Filled 2023-06-04 – 2023-07-02 (×3): qty 30, 30d supply, fill #0
  Filled 2023-07-26: qty 30, 30d supply, fill #1
  Filled 2023-08-27 – 2023-09-07 (×2): qty 30, 30d supply, fill #2

## 2023-06-06 ENCOUNTER — Telehealth: Payer: Self-pay | Admitting: Podiatry

## 2023-06-06 ENCOUNTER — Other Ambulatory Visit: Payer: Self-pay

## 2023-06-06 NOTE — Telephone Encounter (Signed)
Pt left message today at 759am stating she needed an appt with Dr Charlsie Merles asap.  I returned call and left message for pt to call to schedule an appt.

## 2023-06-08 ENCOUNTER — Encounter: Payer: Self-pay | Admitting: Family Medicine

## 2023-06-08 ENCOUNTER — Ambulatory Visit: Payer: BC Managed Care – PPO | Admitting: Family Medicine

## 2023-06-08 VITALS — BP 132/86 | HR 90 | Temp 97.6°F | Ht 59.0 in | Wt 141.0 lb

## 2023-06-08 DIAGNOSIS — M79672 Pain in left foot: Secondary | ICD-10-CM

## 2023-06-08 NOTE — Progress Notes (Signed)
Subjective:     Patient ID: [name], female    DOB: August 23, 1960, 62 y.o.   MRN: [id number]  Chief Complaint  Patient presents with   Foot Pain    Hit left foot 2-4 weeks ago and stubbed pinky toe. Was given abx at visit but still having troubles while walking and putting pressure on it. L foot hurts across the top, only thing that helps is a ace bandage w pressure    HPI   History of Present Illness         [name] is here with complaints of continued left foot pain post injury 3 to 4 weeks ago.  [name] was evaluated and treated by Dr. Jonny Ruiz approximately 2 weeks ago.  Foot x-ray negative for acute fracture.  [name] has been wearing an Ace wrap which helps.  [name] has been taking meloxicam which also helps with pain.  [name] is concerned because [name] foot is not much improved.  [name] completed a course of antibiotics as well    Health Maintenance Due  Topic Date Due   MAMMOGRAM  10/22/2021   COVID-19 Vaccine (4 - 2024-25 season) 02/25/2023    Past Medical History:  Diagnosis Date   Anemia    Arthritis    "neck, hips" (04/25/2018)   Claustrophobia    Daily headache    GERD (gastroesophageal reflux disease)    Hiatal hernia    Hyperlipidemia    "hx" (04/25/2018)   Hypertension    Plantar fasciitis    hx - left foot   Stroke (HCC) 05/2014   "mini stroke" (04/25/2018)    Past Surgical History:  Procedure Laterality Date   ABDOMINAL HYSTERECTOMY  12/09   Secondary to fibroids   ARTERY BIOPSY Right 04/10/2018   Procedure: BIOPSY TEMPORAL ARTERY;  Surgeon: Larina Earthly, MD;  Location: [facility];  Service: Vascular;  Laterality: Right;   CESAREAN SECTION  1986; 1988   COLONOSCOPY     FOOT SURGERY Left 03/2017   Bone Spurs Removed   LUMBAR LAMINECTOMY/DECOMPRESSION MICRODISCECTOMY N/A 04/13/2021   Procedure: LUMBAR FOUR- LUMBAR FIVE AND LUMBAR FIVE-SACRAL ONE  DECOMPRESSION;  Surgeon: Eldred Manges, MD;  Location: [facility];  Service: Orthopedics;  Laterality: N/A;   RADIOLOGY WITH  ANESTHESIA N/A 04/26/2018   Procedure: MRI WITH ANESTHESIA;  Surgeon: Radiologist, Medication, MD;  Location: [facility];  Service: Radiology;  Laterality: N/A;   RADIOLOGY WITH ANESTHESIA N/A 08/24/2019   Procedure: MRI WITH ANESTHESIA CERVICAL SPINE;  Surgeon: Radiologist, Medication, MD;  Location: [facility];  Service: Radiology;  Laterality: N/A;   RADIOLOGY WITH ANESTHESIA N/A 10/02/2019   Procedure: MRI WITH ANESTHESIA CERVICAL WITHOUT CONTRAST;  Surgeon: Radiologist, Medication, MD;  Location: [facility];  Service: Radiology;  Laterality: N/A;   TUBAL LIGATION  1988    Family History  Problem Relation Age of Onset   Pancreatic cancer Mother 31   Heart disease Mother    Esophageal cancer Father 89   Heart disease Sister    Breast cancer Maternal Aunt 60   Colon cancer Maternal Uncle    Pancreatic cancer Maternal Uncle    Colon cancer Paternal Uncle    Rectal cancer Neg Hx    Stomach cancer Neg Hx     Social History   Socioeconomic History   Marital status: Divorced    Spouse name: Not on file   Number of children: 2   Years of education: Not on file   Highest education level: Not on file  Occupational History   Not on file  Tobacco Use   Smoking status: Some Days    Current packs/day: 0.25    Average packs/day: 0.3 packs/day for 38.0 years (9.5 ttl pk-yrs)    Types: Cigarettes   Smokeless tobacco: Never   Tobacco comments:    1 cigs/day 08/17/2022 km,cma  Vaping Use   Vaping status: Never Used  Substance and Sexual Activity   Alcohol use: Yes    Alcohol/week: 4.0 standard drinks of alcohol    Types: 4 Cans of beer per week   Drug use: Not Currently   Sexual activity: Not Currently    Birth control/protection: Surgical    Comment: Hysterectomy  Other Topics Concern   Not on file  Social History Narrative   10 th grade education.   Social Drivers of Corporate investment banker Strain: Not on file  Food Insecurity: Not on file  Transportation Needs: No Transportation Needs  (08/27/2019)   PRAPARE - Administrator, Civil Service (Medical): No    Lack of Transportation (Non-Medical): No  Physical Activity: Not on file  Stress: Not on file  Social Connections: Not on file  Intimate Partner Violence: Not on file    Outpatient Medications Prior to Visit  Medication Sig Dispense Refill   albuterol (VENTOLIN HFA) 108 (90 Base) MCG/ACT inhaler Inhale 2 puffs into the lungs every 6 (six) hours as needed for wheezing or shortness of breath. 8 g 0   amLODipine (NORVASC) 5 MG tablet Take 1 tablet (5 mg total) by mouth daily. 90 tablet 1   diclofenac Sodium (VOLTAREN) 1 % GEL Apply 2 g topically 4 (four) times daily. 200 g 1   diltiazem 2 % GEL Please apply 5 times daily 30 g 1   folic acid (FOLVITE) 1 MG tablet Take 1 tablet (1 mg total) by mouth daily. 30 tablet 2   lidocaine (XYLOCAINE) 5 % ointment Apply a small amount inside the anal area twice daily for 4 weeks. 35.44 g 0   lipase/protease/amylase (CREON) 36000 UNITS CPEP capsule Take 3 capsules by mouth 3 times daily with meals AND 2 capsules with snacks 390 capsule 11   meloxicam (MOBIC) 15 MG tablet Take 1 tablet (15 mg total) by mouth daily as needed for pain. 30 tablet 2   omega-3 acid ethyl esters (LOVAZA) 1 g capsule Take 2 capsules (2 g total) by mouth 2 (two) times daily. 120 capsule 1   ondansetron (ZOFRAN) 4 MG tablet Take 1 tablet (4 mg total) by mouth every 8 (eight) hours as needed for nausea or vomiting. 20 tablet 0   pantoprazole (PROTONIX) 40 MG tablet Take 1 tablet (40 mg total) by mouth 2 (two) times daily. Take 30 minutes before breakfast. 60 tablet 2   potassium chloride (KLOR-CON M) 10 MEQ tablet Take 1 tablet (10 mEq total) by mouth daily. 30 tablet 2   amoxicillin-clavulanate (AUGMENTIN) 875-125 MG tablet Take 1 tablet by mouth 2 (two) times daily. (Patient not taking: Reported on 06/08/2023) 20 tablet 0   No facility-administered medications prior to visit.    Allergies  Allergen  Reactions   Aspirin Hives   Hydromorphone Hcl Hives and Nausea And Vomiting    Can take Norco   Crestor [Rosuvastatin]     myalgias   Latex Rash    ROS     Objective:    Physical Exam Constitutional:      General: [name] is not in acute distress.  Appearance: [name] is not ill-appearing.  Cardiovascular:     Rate and Rhythm: Normal rate.  Pulmonary:     Effort: Pulmonary effort is normal.  Musculoskeletal:     Cervical back: Normal range of motion and neck supple.     Right lower leg: No edema.     Left lower leg: No edema.     Left ankle: Normal.     Left foot: Normal capillary refill. Tenderness present. No swelling or deformity. Normal pulse.       Legs:     Comments: TTP over 5th toe and lateral dorsal midfoot area.   Skin:    General: Skin is warm and dry.     Capillary Refill: Capillary refill takes less than 2 seconds.     Findings: No bruising.  Neurological:     General: No focal deficit present.     Mental Status: [name] is alert and oriented to person, place, and time.     Sensory: No sensory deficit.     Motor: No weakness.     Coordination: Coordination normal.     Gait: Gait abnormal.  Psychiatric:        Mood and Affect: Mood normal.        Behavior: Behavior normal.        Thought Content: Thought content normal.      BP 132/86 (BP Location: Left Arm, Patient Position: Sitting)   Pulse 90   Temp 97.6 F (36.4 C) (Temporal)   Ht 4\' 11"  (1.499 m)   Wt 141 lb (64 kg)   LMP  (LMP Unknown)   SpO2 99%   BMI 28.48 kg/m  Wt Readings from Last 3 Encounters:  06/08/23 141 lb (64 kg)  05/17/23 139 lb (63 kg)  05/02/23 138 lb (62.6 kg)       Assessment & Plan:   Problem List Items Addressed This Visit     Left foot pain - Primary   No red flag symptoms.  No sign of infection. Concerning that [name] is not much better since [name] foot injury almost 1 month ago.  Reviewed notes and previous x-ray.  Recommend wearing a hard soled shoe which [name] has at  home.  We also discussed continuing with Ace wrap, ice and Voltaren gel.  [name] will continue meloxicam for the next week or 2 weeks and follow-up with [name] podiatrist.  I have discontinued Skylee P. Kahan's amoxicillin-clavulanate. I am also having [name] maintain [name] diclofenac Sodium, lipase/protease/amylase, diltiazem, lidocaine, pantoprazole, omega-3 acid ethyl esters, albuterol, ondansetron, folic acid, meloxicam, amLODipine, and potassium chloride.  No orders of the defined types were placed in this encounter.

## 2023-06-08 NOTE — Patient Instructions (Signed)
Continue taking once daily meloxicam.  Take it with food.  Use over-the-counter Voltaren gel  Wear the hard soled shoe if this helps with the pain.  Use the Ace wrap  Follow-up with Dr. Charlsie Merles

## 2023-06-11 ENCOUNTER — Inpatient Hospital Stay: Payer: BC Managed Care – PPO | Admitting: Internal Medicine

## 2023-06-11 ENCOUNTER — Ambulatory Visit: Payer: BC Managed Care – PPO | Admitting: Podiatry

## 2023-06-11 ENCOUNTER — Inpatient Hospital Stay: Payer: BC Managed Care – PPO | Attending: Internal Medicine

## 2023-06-14 ENCOUNTER — Other Ambulatory Visit (HOSPITAL_COMMUNITY): Payer: Self-pay

## 2023-06-15 ENCOUNTER — Ambulatory Visit (INDEPENDENT_AMBULATORY_CARE_PROVIDER_SITE_OTHER): Payer: BC Managed Care – PPO

## 2023-06-15 ENCOUNTER — Ambulatory Visit: Payer: BC Managed Care – PPO | Admitting: Podiatry

## 2023-06-15 ENCOUNTER — Encounter: Payer: Self-pay | Admitting: Podiatry

## 2023-06-15 DIAGNOSIS — M7752 Other enthesopathy of left foot: Secondary | ICD-10-CM | POA: Diagnosis not present

## 2023-06-15 MED ORDER — TRIAMCINOLONE ACETONIDE 10 MG/ML IJ SUSP
10.0000 mg | Freq: Once | INTRAMUSCULAR | Status: AC
  Administered 2023-06-15: 10 mg via INTRA_ARTICULAR

## 2023-06-15 NOTE — Progress Notes (Signed)
Subjective:   Patient ID: [name], female   DOB: 62 y.o.   MRN: [id number]   HPI Patient states that she traumatized her left fifth digit about a month ago and while that is improving she walk differently and has developed a lot of pain in her left ankle with swelling   ROS      Objective:  Physical Exam  Neurovascular status intact inflammation of the sinus tarsi left fluid buildup within the joint secondary to probable change in gait pattern with quite a bit of pain with palpation     Assessment:  Inflammatory capsulitis of the sinus tarsi rule out fluid buildup swelling     Plan:  H&P reviewed sterile prep injected the sinus tarsi left 3 mg Kenalog 5 mg Xylocaine advised on reduced activities and good shoe gear support and dispensed ankle compression stocking to support the ankle and reduce swelling  X-rays ankle were negative for signs of fracture or diastases injury arthritis

## 2023-06-18 ENCOUNTER — Inpatient Hospital Stay: Payer: BC Managed Care – PPO

## 2023-06-18 ENCOUNTER — Inpatient Hospital Stay: Payer: BC Managed Care – PPO | Admitting: Internal Medicine

## 2023-07-02 ENCOUNTER — Other Ambulatory Visit (HOSPITAL_COMMUNITY): Payer: Self-pay

## 2023-07-02 ENCOUNTER — Other Ambulatory Visit: Payer: Self-pay

## 2023-07-02 ENCOUNTER — Other Ambulatory Visit: Payer: Self-pay | Admitting: Nurse Practitioner

## 2023-07-02 MED ORDER — PANTOPRAZOLE SODIUM 40 MG PO TBEC
40.0000 mg | DELAYED_RELEASE_TABLET | Freq: Two times a day (BID) | ORAL | 2 refills | Status: DC
  Filled 2023-07-02 (×2): qty 60, 30d supply, fill #0
  Filled 2023-07-26: qty 60, 30d supply, fill #1
  Filled 2023-09-07: qty 60, 30d supply, fill #2

## 2023-07-02 MED ORDER — LIDOCAINE 5 % EX OINT
1.0000 | TOPICAL_OINTMENT | Freq: Two times a day (BID) | CUTANEOUS | 0 refills | Status: DC
  Filled 2023-07-02: qty 35.44, 28d supply, fill #0
  Filled 2023-07-02: qty 35.44, 18d supply, fill #0

## 2023-07-03 ENCOUNTER — Other Ambulatory Visit: Payer: Self-pay

## 2023-07-03 ENCOUNTER — Other Ambulatory Visit (HOSPITAL_COMMUNITY): Payer: Self-pay

## 2023-07-06 ENCOUNTER — Other Ambulatory Visit: Payer: Self-pay

## 2023-07-09 ENCOUNTER — Other Ambulatory Visit (HOSPITAL_COMMUNITY): Payer: Self-pay

## 2023-07-11 NOTE — Progress Notes (Deleted)
[facility] OFFICE PROGRESS NOTE  Avanell Shackleton, NP-C [address] [place] [address]  DIAGNOSIS: Microcytic anemia with history of iron deficiency   PRIOR THERAPY: None  CURRENT THERAPY: Over-the-counter oral iron tablets in addition to folic acid once daily ***  INTERVAL HISTORY: [name] 63 y.o. female returns to clinic today for follow-up visit.  The patient is followed for history of microcytic anemia secondary to rectal bleeding from internal hemorrhoids and chronic diarrhea.  She is on creon and follows with Dr. Marina Goodell for concerns related to her pancreas. Patient has never required any IV iron infusions.  She takes a ***confirm that she is actually taking an oral iron supplement?  Or juice?  She tries to ***dietary intake of iron rich food.  She is also on folic acid due to previous blood work showing folate deficiency.  She has some fatigue. She denies any lightheadedness or dyspnea on exertion but she sometimes sees dark spots. She is waiting to obtain her vision insurance before scheduling with her eye doctor.  She denies any vaginal bleeding as she is status post a hysterectomy from 2009 due to fibroids.  Her last colonoscopy was in 2021 which showed one 3 mm polyp and small internal hemorrhoids. She sometimes has rectal bleeding due to hemorrhoids and anal fissures. She also had workup a few years ago by GI for her mild elevated transaminitis and no clear etiology was identified. She had an EGD on 08/18/21 showing normal exam. Denies other bleeding.  Denies any chills or night sweats, or lymphadenopathy.  She had lab work performed a few days ago. The purpose of the visit is to review her lab results.   MEDICAL HISTORY: Past Medical History:  Diagnosis Date   Anemia    Arthritis    "neck, hips" (04/25/2018)   Claustrophobia    Daily headache    GERD (gastroesophageal reflux disease)    Hiatal hernia    Hyperlipidemia    "hx" (04/25/2018)    Hypertension    Plantar fasciitis    hx - left foot   Stroke (HCC) 05/2014   "mini stroke" (04/25/2018)    ALLERGIES:  is allergic to aspirin, hydromorphone hcl, crestor [rosuvastatin], and latex.  MEDICATIONS:  Current Outpatient Medications  Medication Sig Dispense Refill   albuterol (VENTOLIN HFA) 108 (90 Base) MCG/ACT inhaler Inhale 2 puffs into the lungs every 6 (six) hours as needed for wheezing or shortness of breath. 8 g 0   amLODipine (NORVASC) 5 MG tablet Take 1 tablet (5 mg total) by mouth daily. 90 tablet 1   diclofenac Sodium (VOLTAREN) 1 % GEL Apply 2 g topically 4 (four) times daily. 200 g 1   diltiazem 2 % GEL Please apply 5 times daily 30 g 1   folic acid (FOLVITE) 1 MG tablet Take 1 tablet (1 mg total) by mouth daily. 30 tablet 2   lidocaine (XYLOCAINE) 5 % ointment Apply a small amount inside the anal area twice daily for 4 weeks. 35.44 g 0   lipase/protease/amylase (CREON) 36000 UNITS CPEP capsule Take 3 capsules (108,000 Units total) by mouth 3 (three) times daily with meals AND 2 capsules (72,000 Units total) with snacks. 390 capsule 11   meloxicam (MOBIC) 15 MG tablet Take 1 tablet (15 mg total) by mouth daily as needed for pain. 30 tablet 2   omega-3 acid ethyl esters (LOVAZA) 1 g capsule Take 2 capsules (2 g total) by mouth 2 (two) times daily.  120 capsule 1   ondansetron (ZOFRAN) 4 MG tablet Take 1 tablet (4 mg total) by mouth every 8 (eight) hours as needed for nausea or vomiting. 20 tablet 0   pantoprazole (PROTONIX) 40 MG tablet Take 1 tablet (40 mg total) by mouth 2 (two) times daily. Take 30 minutes before breakfast. 60 tablet 2   potassium chloride (KLOR-CON M) 10 MEQ tablet Take 1 tablet (10 mEq total) by mouth daily. 30 tablet 2   No current facility-administered medications for this visit.    SURGICAL HISTORY:  Past Surgical History:  Procedure Laterality Date   ABDOMINAL HYSTERECTOMY  12/09   Secondary to fibroids   ARTERY BIOPSY Right 04/10/2018    Procedure: BIOPSY TEMPORAL ARTERY;  Surgeon: Larina Earthly, MD;  Location: [facility] OR;  Service: Vascular;  Laterality: Right;   CESAREAN SECTION  1986; 1988   COLONOSCOPY     FOOT SURGERY Left 03/2017   Bone Spurs Removed   LUMBAR LAMINECTOMY/DECOMPRESSION MICRODISCECTOMY N/A 04/13/2021   Procedure: LUMBAR FOUR- LUMBAR FIVE AND LUMBAR FIVE-SACRAL ONE  DECOMPRESSION;  Surgeon: Eldred Manges, MD;  Location: [facility] OR;  Service: Orthopedics;  Laterality: N/A;   RADIOLOGY WITH ANESTHESIA N/A 04/26/2018   Procedure: MRI WITH ANESTHESIA;  Surgeon: Radiologist, Medication, MD;  Location: [facility] OR;  Service: Radiology;  Laterality: N/A;   RADIOLOGY WITH ANESTHESIA N/A 08/24/2019   Procedure: MRI WITH ANESTHESIA CERVICAL SPINE;  Surgeon: Radiologist, Medication, MD;  Location: [facility] OR;  Service: Radiology;  Laterality: N/A;   RADIOLOGY WITH ANESTHESIA N/A 10/02/2019   Procedure: MRI WITH ANESTHESIA CERVICAL WITHOUT CONTRAST;  Surgeon: Radiologist, Medication, MD;  Location: [facility] OR;  Service: Radiology;  Laterality: N/A;   TUBAL LIGATION  1988    REVIEW OF SYSTEMS:   Review of Systems  Constitutional: Negative for appetite change, chills, fatigue, fever and unexpected weight change.  HENT:   Negative for mouth sores, nosebleeds, sore throat and trouble swallowing.   Eyes: Negative for eye problems and icterus.  Respiratory: Negative for cough, hemoptysis, shortness of breath and wheezing.   Cardiovascular: Negative for chest pain and leg swelling.  Gastrointestinal: Negative for abdominal pain, constipation, diarrhea, nausea and vomiting.  Genitourinary: Negative for bladder incontinence, difficulty urinating, dysuria, frequency and hematuria.   Musculoskeletal: Negative for back pain, gait problem, neck pain and neck stiffness.  Skin: Negative for itching and rash.  Neurological: Negative for dizziness, extremity weakness, gait problem, headaches, light-headedness and seizures.  Hematological: Negative for  adenopathy. Does not bruise/bleed easily.  Psychiatric/Behavioral: Negative for confusion, depression and sleep disturbance. The patient is not nervous/anxious.     PHYSICAL EXAMINATION:  There were no vitals taken for this visit.  ECOG PERFORMANCE STATUS: {CHL ONC ECOG [id number]  Physical Exam  Constitutional: Oriented to person, place, and time and well-developed, well-nourished, and in no distress. No distress.  HENT:  Head: Normocephalic and atraumatic.  Mouth/Throat: Oropharynx is clear and moist. No oropharyngeal exudate.  Eyes: Conjunctivae are normal. Right eye exhibits no discharge. Left eye exhibits no discharge. No scleral icterus.  Neck: Normal range of motion. Neck supple.  Cardiovascular: Normal rate, regular rhythm, normal heart sounds and intact distal pulses.   Pulmonary/Chest: Effort normal and breath sounds normal. No respiratory distress. No wheezes. No rales.  Abdominal: Soft. Bowel sounds are normal. Exhibits no distension and no mass. There is no tenderness.  Musculoskeletal: Normal range of motion. Exhibits no edema.  Lymphadenopathy:    No cervical adenopathy.  Neurological: Alert and oriented to person,  place, and time. Exhibits normal muscle tone. Gait normal. Coordination normal.  Skin: Skin is warm and dry. No rash noted. Not diaphoretic. No erythema. No pallor.  Psychiatric: Mood, memory and judgment normal.  Vitals reviewed.  LABORATORY DATA: Lab Results  Component Value Date   WBC 6.6 04/20/2023   HGB 10.7 (L) 04/20/2023   HCT 33.1 (L) 04/20/2023   MCV 72.3 (L) 04/20/2023   PLT 295.0 04/20/2023      Chemistry      Component Value Date/Time   NA 143 04/20/2023 1545   NA 143 02/18/2020 1429   K 3.8 04/20/2023 1545   CL 105 04/20/2023 1545   CO2 28 04/20/2023 1545   BUN 16 04/20/2023 1545   BUN 13 02/18/2020 1429   CREATININE 0.74 04/20/2023 1545   CREATININE 0.88 03/19/2023 0731   CREATININE 0.60 04/23/2014 0910      Component  Value Date/Time   CALCIUM 8.8 04/20/2023 1545   ALKPHOS 72 03/19/2023 0731   AST 28 03/19/2023 0731   ALT 44 03/19/2023 0731   BILITOT 0.3 03/19/2023 0731       RADIOGRAPHIC STUDIES:  DG Ankle 2 Views Left Result Date: 06/15/2023 Please see detailed radiograph report in office note.    ASSESSMENT/PLAN:  This is a very pleasant 63 year old African-American female with history of microcytic anemia of unclear etiology. The patient had extensive work-up performed in the past including iron study and ferritin that were unremarkable. She also had hemoglobin electrophoresis that showed no evidence for sickle cell anemia or sickle cell trait. Her heavy metal testing was negative. The only abnormality was found on her previous blood work was low serum folate and she is currently on folic acid and tolerating it fairly well.   ***Confirm it taking iron supplements.    Repeat CBC showed stable persistent anemia with hemoglobin of *** and hematocrit and low MCV of ***.   Labs were reviewed with Dr. Arbutus Ped who recommends ***  ***She will continue to follow up with Dr. Marina Goodell regarding her pancreas concerns. She tells me she is currently on creon. She does have rectal bleeding/anal fissures which could also be contributing to her anemia although her iron is normal at this time. Her CMP shows persistent mild transaminitis. Per chart review, she had a throughout investigation into this with GI a few years ago without clear etiology. ***  The patient was advised to call immediately if she has any concerning symptoms in the interval. The patient voices understanding of current disease status and treatment options and is in agreement with the current care plan. All questions were answered. The patient knows to call the clinic with any problems, questions or concerns. We can certainly see the patient much sooner if necessary       No orders of the defined types were placed in this encounter.    I  spent {CHL ONC TIME VISIT - ZOXWR:[id number]} counseling the patient face to face. The total time spent in the appointment was {CHL ONC TIME VISIT - BJYNW:[id number]}.  Marcelis Wissner L Clayton Bosserman, PA-C 07/11/23

## 2023-07-16 ENCOUNTER — Inpatient Hospital Stay: Payer: 59 | Admitting: Physician Assistant

## 2023-07-16 ENCOUNTER — Inpatient Hospital Stay: Payer: 59

## 2023-07-18 ENCOUNTER — Telehealth: Payer: Self-pay | Admitting: Physician Assistant

## 2023-07-18 NOTE — Progress Notes (Signed)
[facility] OFFICE PROGRESS NOTE  Avanell Shackleton, NP-C [address] [place] [address]  DIAGNOSIS: Microcytic anemia with history of iron deficiency   PRIOR THERAPY: None  CURRENT THERAPY: Over-the-counter oral iron tablets in addition to folic acid once daily. She only started   INTERVAL HISTORY: [name] 63 y.o. female returns to the clinic today for a follow-up visit.  The patient is followed for history of microcytic anemia secondary to rectal bleeding from internal hemorrhoids and chronic diarrhea.  She is on creon and follows with Dr. Marina Goodell for concerns related to her pancreas.  Does continue to have rectal bleeding approximately 3-4x per week she is having diarrhea.  She states she may reach out to Dr. Marina Goodell to increase her Creon.  She also was given some type of cream to apply on her hemorrhoids.  The patient has never required any IV iron infusions.  She only just started taking a multivitamin with iron last week. She tolerates this well without any GI upset.  Does try and eat greens and some red meat.  She is also compliant with her folic acid.  She has some fatigue. She denies any lightheadedness or dyspnea on exertion. She denies any vaginal bleeding as she is status post a hysterectomy from 2009 due to fibroids.  Her last colonoscopy was in 2021 which showed one 3 mm polyp and small internal hemorrhoids. She had an EGD on 08/18/21 showing normal exam. Denies other bleeding besides the rectal bleeding.  Denies any chills or night sweats, or lymphadenopathy.  She is here for evaluation and repeat blood work.   MEDICAL HISTORY: Past Medical History:  Diagnosis Date   Anemia    Arthritis    "neck, hips" (04/25/2018)   Claustrophobia    Daily headache    GERD (gastroesophageal reflux disease)    Hiatal hernia    Hyperlipidemia    "hx" (04/25/2018)   Hypertension    Plantar fasciitis    hx - left foot   Stroke (HCC) 05/2014   "mini stroke"  (04/25/2018)    ALLERGIES:  is allergic to aspirin, hydromorphone hcl, crestor [rosuvastatin], and latex.  MEDICATIONS:  Current Outpatient Medications  Medication Sig Dispense Refill   albuterol (VENTOLIN HFA) 108 (90 Base) MCG/ACT inhaler Inhale 2 puffs into the lungs every 6 (six) hours as needed for wheezing or shortness of breath. 8 g 0   amLODipine (NORVASC) 5 MG tablet Take 1 tablet (5 mg total) by mouth daily. 90 tablet 1   diclofenac Sodium (VOLTAREN) 1 % GEL Apply 2 g topically 4 (four) times daily. 200 g 1   diltiazem 2 % GEL Please apply 5 times daily 30 g 1   folic acid (FOLVITE) 1 MG tablet Take 1 tablet (1 mg total) by mouth daily. 30 tablet 2   lidocaine (XYLOCAINE) 5 % ointment Apply a small amount inside the anal area twice daily for 4 weeks. 35.44 g 0   lipase/protease/amylase (CREON) 36000 UNITS CPEP capsule Take 3 capsules (108,000 Units total) by mouth 3 (three) times daily with meals AND 2 capsules (72,000 Units total) with snacks. 390 capsule 11   meloxicam (MOBIC) 15 MG tablet Take 1 tablet (15 mg total) by mouth daily as needed for pain. 30 tablet 2   omega-3 acid ethyl esters (LOVAZA) 1 g capsule Take 2 capsules (2 g total) by mouth 2 (two) times daily. 120 capsule 1   ondansetron (ZOFRAN) 4 MG tablet Take 1 tablet (  4 mg total) by mouth every 8 (eight) hours as needed for nausea or vomiting. 20 tablet 0   pantoprazole (PROTONIX) 40 MG tablet Take 1 tablet (40 mg total) by mouth 2 (two) times daily. Take 30 minutes before breakfast. 60 tablet 2   potassium chloride (KLOR-CON M) 10 MEQ tablet Take 1 tablet (10 mEq total) by mouth daily. 30 tablet 2   No current facility-administered medications for this visit.    SURGICAL HISTORY:  Past Surgical History:  Procedure Laterality Date   ABDOMINAL HYSTERECTOMY  12/09   Secondary to fibroids   ARTERY BIOPSY Right 04/10/2018   Procedure: BIOPSY TEMPORAL ARTERY;  Surgeon: Larina Earthly, MD;  Location: [facility] OR;  Service:  Vascular;  Laterality: Right;   CESAREAN SECTION  1986; 1988   COLONOSCOPY     FOOT SURGERY Left 03/2017   Bone Spurs Removed   LUMBAR LAMINECTOMY/DECOMPRESSION MICRODISCECTOMY N/A 04/13/2021   Procedure: LUMBAR FOUR- LUMBAR FIVE AND LUMBAR FIVE-SACRAL ONE  DECOMPRESSION;  Surgeon: Eldred Manges, MD;  Location: [facility] OR;  Service: Orthopedics;  Laterality: N/A;   RADIOLOGY WITH ANESTHESIA N/A 04/26/2018   Procedure: MRI WITH ANESTHESIA;  Surgeon: Radiologist, Medication, MD;  Location: [facility] OR;  Service: Radiology;  Laterality: N/A;   RADIOLOGY WITH ANESTHESIA N/A 08/24/2019   Procedure: MRI WITH ANESTHESIA CERVICAL SPINE;  Surgeon: Radiologist, Medication, MD;  Location: [facility] OR;  Service: Radiology;  Laterality: N/A;   RADIOLOGY WITH ANESTHESIA N/A 10/02/2019   Procedure: MRI WITH ANESTHESIA CERVICAL WITHOUT CONTRAST;  Surgeon: Radiologist, Medication, MD;  Location: [facility] OR;  Service: Radiology;  Laterality: N/A;   TUBAL LIGATION  1988    REVIEW OF SYSTEMS:   Review of Systems  Constitutional: Positive for fatigue. Negative for appetite change, chills, fever and unexpected weight change.  HENT: Negative for mouth sores, nosebleeds, sore throat and trouble swallowing.   Eyes: Negative for eye problems and icterus.  Respiratory: Negative for cough, hemoptysis, shortness of breath and wheezing.   Cardiovascular: Negative for chest pain and leg swelling.  Gastrointestinal: Positive for chronic diarrhea and rectal bleeding intermittently . Negative for abdominal pain, constipation, nausea and vomiting.  Genitourinary: Negative for bladder incontinence, difficulty urinating, dysuria, frequency and hematuria.   Musculoskeletal: Negative for back pain, gait problem, neck pain and neck stiffness.  Skin: Negative for itching and rash.  Neurological: Negative for dizziness, extremity weakness, gait problem, headaches, light-headedness and seizures.  Hematological: Negative for adenopathy. Does not  bruise/bleed easily.  Psychiatric/Behavioral: Negative for confusion, depression and sleep disturbance. The patient is not nervous/anxious.     PHYSICAL EXAMINATION:  There were no vitals taken for this visit.  ECOG PERFORMANCE STATUS: 1  Physical Exam  Constitutional: Oriented to person, place, and time and well-developed, well-nourished, and in no distress.  HENT:  Head: Normocephalic and atraumatic.  Mouth/Throat: Oropharynx is clear and moist. No oropharyngeal exudate.  Eyes: Conjunctivae are normal. Right eye exhibits no discharge. Left eye exhibits no discharge. No scleral icterus.  Neck: Normal range of motion. Neck supple.  Cardiovascular: Normal rate, regular rhythm, normal heart sounds and intact distal pulses.   Pulmonary/Chest: Effort normal and breath sounds normal. No respiratory distress. No wheezes. No rales.  Abdominal: Soft. Bowel sounds are normal. Exhibits no distension and no mass. There is no tenderness.  Musculoskeletal: Normal range of motion. Exhibits no edema.  Lymphadenopathy:    No cervical adenopathy.  Neurological: Alert and oriented to person, place, and time. Exhibits normal muscle tone. Gait normal. Coordination  normal.  Skin: Skin is warm and dry. No rash noted. Not diaphoretic. No erythema. No pallor.  Psychiatric: Mood, memory and judgment normal.  Vitals reviewed.  LABORATORY DATA: Lab Results  Component Value Date   WBC 6.6 04/20/2023   HGB 10.7 (L) 04/20/2023   HCT 33.1 (L) 04/20/2023   MCV 72.3 (L) 04/20/2023   PLT 295.0 04/20/2023      Chemistry      Component Value Date/Time   NA 143 04/20/2023 1545   NA 143 02/18/2020 1429   K 3.8 04/20/2023 1545   CL 105 04/20/2023 1545   CO2 28 04/20/2023 1545   BUN 16 04/20/2023 1545   BUN 13 02/18/2020 1429   CREATININE 0.74 04/20/2023 1545   CREATININE 0.88 03/19/2023 0731   CREATININE 0.60 04/23/2014 0910      Component Value Date/Time   CALCIUM 8.8 04/20/2023 1545   ALKPHOS 72  03/19/2023 0731   AST 28 03/19/2023 0731   ALT 44 03/19/2023 0731   BILITOT 0.3 03/19/2023 0731       RADIOGRAPHIC STUDIES:  No results found.    ASSESSMENT/PLAN:  This is a very pleasant 63 year old African-American female with history of microcytic anemia of unclear etiology. The patient had extensive work-up performed in the past including iron study and ferritin that were unremarkable. She also had hemoglobin electrophoresis that showed no evidence for sickle cell anemia or sickle cell trait. Her heavy metal testing was negative. The only abnormality was found on her previous blood work was low serum folate and she is currently on folic acid and tolerating it fairly well.   She just recently resumed taking a multivitamin with iron. She also takes folic acid.    Repeat CBC showed stable persistent anemia with hemoglobin of 10.7 and hematocrit and low MCV of 73.8.   I am waiting for the results of her iron studies. I will call her tomorrow with the results if she needs an iron infusion. For now, she was advised to continue on a dedicated oral iron supplement.   We will reach out to Dr. Marina Goodell if needed to see if she needs dose adjustment of her creon and or if she ever has worsening hemorrhoidal bleeding if she needs additional management.   The patient was advised to call immediately if she has any concerning symptoms in the interval. The patient voices understanding of current disease status and treatment options and is in agreement with the current care plan. All questions were answered. The patient knows to call the clinic with any problems, questions or concerns. We can certainly see the patient much sooner if necessary       No orders of the defined types were placed in this encounter.    The total time spent in the appointment was 20-29 minutes  Ruth Puskas L Tasfia Vasseur, PA-C 07/18/23

## 2023-07-18 NOTE — Telephone Encounter (Signed)
Rescheduled appointments per 1/20 scheduling message. Patient is aware of the new appointments and will be mailed an appointment reminder.

## 2023-07-26 ENCOUNTER — Other Ambulatory Visit (HOSPITAL_COMMUNITY): Payer: Self-pay

## 2023-07-26 ENCOUNTER — Inpatient Hospital Stay (HOSPITAL_BASED_OUTPATIENT_CLINIC_OR_DEPARTMENT_OTHER): Payer: 59 | Admitting: Physician Assistant

## 2023-07-26 ENCOUNTER — Other Ambulatory Visit: Payer: Self-pay | Admitting: Nurse Practitioner

## 2023-07-26 ENCOUNTER — Inpatient Hospital Stay: Payer: Self-pay | Attending: Internal Medicine

## 2023-07-26 VITALS — BP 134/89 | HR 87 | Temp 97.5°F | Resp 51 | Wt 137.0 lb

## 2023-07-26 DIAGNOSIS — D509 Iron deficiency anemia, unspecified: Secondary | ICD-10-CM | POA: Diagnosis present

## 2023-07-26 DIAGNOSIS — D649 Anemia, unspecified: Secondary | ICD-10-CM | POA: Diagnosis not present

## 2023-07-26 DIAGNOSIS — Z79899 Other long term (current) drug therapy: Secondary | ICD-10-CM | POA: Insufficient documentation

## 2023-07-26 LAB — IRON AND IRON BINDING CAPACITY (CC-WL,HP ONLY)
Iron: 128 ug/dL (ref 28–170)
Saturation Ratios: 31 % (ref 10.4–31.8)
TIBC: 417 ug/dL (ref 250–450)
UIBC: 289 ug/dL (ref 148–442)

## 2023-07-26 LAB — CBC WITH DIFFERENTIAL (CANCER CENTER ONLY)
Abs Immature Granulocytes: 0.02 10*3/uL (ref 0.00–0.07)
Basophils Absolute: 0 10*3/uL (ref 0.0–0.1)
Basophils Relative: 0 %
Eosinophils Absolute: 0.1 10*3/uL (ref 0.0–0.5)
Eosinophils Relative: 2 %
HCT: 34.3 % — ABNORMAL LOW (ref 36.0–46.0)
Hemoglobin: 10.7 g/dL — ABNORMAL LOW (ref 12.0–15.0)
Immature Granulocytes: 0 %
Lymphocytes Relative: 47 %
Lymphs Abs: 2.5 10*3/uL (ref 0.7–4.0)
MCH: 23 pg — ABNORMAL LOW (ref 26.0–34.0)
MCHC: 31.2 g/dL (ref 30.0–36.0)
MCV: 73.8 fL — ABNORMAL LOW (ref 80.0–100.0)
Monocytes Absolute: 0.4 10*3/uL (ref 0.1–1.0)
Monocytes Relative: 8 %
Neutro Abs: 2.4 10*3/uL (ref 1.7–7.7)
Neutrophils Relative %: 43 %
Platelet Count: 279 10*3/uL (ref 150–400)
RBC: 4.65 MIL/uL (ref 3.87–5.11)
RDW: 14 % (ref 11.5–15.5)
WBC Count: 5.4 10*3/uL (ref 4.0–10.5)
nRBC: 0 % (ref 0.0–0.2)

## 2023-07-27 ENCOUNTER — Telehealth: Payer: Self-pay | Admitting: Physician Assistant

## 2023-07-27 LAB — FERRITIN: Ferritin: 99 ng/mL (ref 11–307)

## 2023-07-27 NOTE — Telephone Encounter (Signed)
I called the patient to let her know she does not need an iron infusion at this time. Recommend she continue to take her iron as she is currently taking it. She expressed understanding.

## 2023-07-30 ENCOUNTER — Encounter (HOSPITAL_COMMUNITY): Payer: Self-pay

## 2023-07-30 ENCOUNTER — Other Ambulatory Visit (HOSPITAL_COMMUNITY): Payer: Self-pay

## 2023-08-01 ENCOUNTER — Other Ambulatory Visit (HOSPITAL_COMMUNITY): Payer: Self-pay

## 2023-08-01 ENCOUNTER — Other Ambulatory Visit: Payer: Self-pay | Admitting: Family Medicine

## 2023-08-01 DIAGNOSIS — E538 Deficiency of other specified B group vitamins: Secondary | ICD-10-CM

## 2023-08-02 ENCOUNTER — Other Ambulatory Visit: Payer: Self-pay | Admitting: Family Medicine

## 2023-08-02 ENCOUNTER — Telehealth: Payer: Self-pay | Admitting: Nurse Practitioner

## 2023-08-02 ENCOUNTER — Other Ambulatory Visit (HOSPITAL_COMMUNITY): Payer: Self-pay

## 2023-08-02 MED ORDER — FOLIC ACID 1 MG PO TABS
1.0000 mg | ORAL_TABLET | Freq: Every day | ORAL | 2 refills | Status: DC
  Filled 2023-08-02: qty 30, 30d supply, fill #0
  Filled 2023-09-07: qty 30, 30d supply, fill #1
  Filled 2023-10-01: qty 30, 30d supply, fill #2

## 2023-08-02 NOTE — Telephone Encounter (Signed)
 Medication refilled today by NP Boby Mackintosh. No need for duplication.  Copied from CRM [id number]. Topic: Clinical - Medication Refill >> Aug 02, 2023 10:53 AM [name] wrote: Most Recent Primary Care Visit:  Provider: HENSON, VICKIE L  Department: [facility]  Visit Type: OFFICE VISIT  Date: 06/08/2023  Medication: folic acid  (FOLVITE ) 1 MG tablet  Has the patient contacted their pharmacy? Yes, pharmacy states they've sent the re-fill request 2 times. (Agent: If no, request that the patient contact the pharmacy for the refill. If patient does not wish to contact the pharmacy document the reason why and proceed with request.) (Agent: If yes, when and what did the pharmacy advise?)  Is this the correct pharmacy for this prescription? Yes If no, delete pharmacy and type the correct one.  This is the patient's preferred pharmacy:     [facility]  [address], [place] [address]   Has the prescription been filled recently? Yes  Is the patient out of the medication? 2 left  Has the patient been seen for an appointment in the last year OR does the patient have an upcoming appointment? Yes  Can we respond through MyChart? No  Agent: Please be advised that Rx refills may take up to 3 business days. We ask that you follow-up with your pharmacy.

## 2023-08-02 NOTE — Telephone Encounter (Signed)
 Copied from CRM [id number]. Topic: Clinical - Medication Refill >> Aug 02, 2023 10:53 AM Corean SAUNDERS wrote: Most Recent Primary Care Visit:  Provider: LENDIA BOBY CROME  Department: [facility]  Visit Type: OFFICE VISIT  Date: 06/08/2023  Medication: folic acid  (FOLVITE ) 1 MG tablet  Has the patient contacted their pharmacy? Yes, pharmacy states they've sent the re-fill request 2 times. (Agent: If no, request that the patient contact the pharmacy for the refill. If patient does not wish to contact the pharmacy document the reason why and proceed with request.) (Agent: If yes, when and what did the pharmacy advise?)  Is this the correct pharmacy for this prescription?  If no, delete pharmacy and type the correct one.  This is the patient's preferred pharmacy:     [place] [facility]  [address] [place] [address]   Has the prescription been filled recently? Yes  Is the patient out of the medication? 2 left  Has the patient been seen for an appointment in the last year OR does the patient have an upcoming appointment? Yes  Can we respond through MyChart? No  Agent: Please be advised that Rx refills may take up to 3 business days. We ask that you follow-up with your pharmacy.

## 2023-08-02 NOTE — Telephone Encounter (Signed)
 Inbound call from patient, requesting refill for lidocaine  cream for her rectum.

## 2023-08-03 MED ORDER — LIDOCAINE 5 % EX OINT: 1.0000 | TOPICAL_OINTMENT | Freq: Two times a day (BID) | CUTANEOUS | 0 refills | Status: DC | PRN

## 2023-08-03 NOTE — Telephone Encounter (Signed)
 DD, ok to send in RX for Lidocaine  5% ointment insert a small amount into the anal opening to the external area twice daily PRN 3 1 tube, no refills THX.

## 2023-08-06 ENCOUNTER — Ambulatory Visit (HOSPITAL_COMMUNITY)
Admission: EM | Admit: 2023-08-06 | Discharge: 2023-08-06 | Disposition: A | Discharge status: Home / Self Care | Payer: 59 | Attending: Family | Admitting: Family

## 2023-08-06 ENCOUNTER — Other Ambulatory Visit (HOSPITAL_COMMUNITY): Payer: Self-pay

## 2023-08-06 ENCOUNTER — Encounter (HOSPITAL_COMMUNITY): Payer: Self-pay

## 2023-08-06 DIAGNOSIS — R6883 Chills (without fever): Secondary | ICD-10-CM | POA: Diagnosis not present

## 2023-08-06 DIAGNOSIS — R197 Diarrhea, unspecified: Secondary | ICD-10-CM | POA: Diagnosis not present

## 2023-08-06 DIAGNOSIS — I10 Essential (primary) hypertension: Secondary | ICD-10-CM

## 2023-08-06 DIAGNOSIS — R110 Nausea: Secondary | ICD-10-CM

## 2023-08-06 MED ORDER — ONDANSETRON 8 MG PO TBDP
8.0000 mg | ORAL_TABLET | Freq: Three times a day (TID) | ORAL | 0 refills | Status: AC | PRN
  Filled 2023-08-06: qty 15, 5d supply, fill #0

## 2023-08-06 NOTE — ED Triage Notes (Signed)
 Pt c/o fatigue, nausea, diarrhea, and chills since last Thursday. States took tylenol  yesterday with no relief.

## 2023-08-06 NOTE — Discharge Instructions (Addendum)
 Recommend start Zofran  8mg  dissolving tablet- take 1 every 8 hours as needed for nausea. Continue to push fluids with electrolytes such as Gatorade or Powerade. May continue OTC Tylenol  1000mg  every 8 hours as needed for headache or chills. May also continue Meloxicam  15mg  once daily. Also encouraged to take your blood pressure medication (Amlodipine ) since your blood pressure is elevated today. Slowly increase solid foods as tolerated. If your symptoms have not improved within 48 hours or you are unable to keep down any fluids within 24 hours, go to the ER ASAP. Otherwise, follow-up with your PCP as planned.

## 2023-08-06 NOTE — ED Provider Notes (Signed)
 [facility]    CSN: [id number] Arrival date & time: 08/06/23  6295      History   Chief Complaint Chief Complaint  Patient presents with   Fatigue   Nausea    HPI [name] is a 63 y.o. female.    63 year old female presents with nausea, chills and body aches that started about 4 to 5 days ago. First started with some nausea and chills. Went to work but then left due to not feeling well. Then proceeded to have diarrhea, fatigue and some headache. Denies any vomiting but continues to have nausea with limited food intake. Diarrhea has improved but still feeling tired with occasional chills and frontal headache. Denies any congestion or cough. Has taken Tylenol  for her headache with some relief. Also has been drinking Gingerale and chicken broth. Had Zofran  in the past but medication expired so did not take any yet. Also has history of HTN and currently on Norvasc  daily but has not taken her medication recently due to the nausea. Other chronic health issues include GERD, anemia, asthma, hyperlipidemia, arthritis, ankle pain and chronic diarrhea. Currently on Creon , Protonix , Iron supplements, Folate and Omega-3 daily and Mobic  and Albuterol  prn. Also will take Imodium  as needed for severe diarrhea. Has not taken this recently.   The history is provided by the patient.    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    Cough due to ACE inhibitor 05/16/2022   Myalgia 05/16/2022   Acute bronchitis 05/16/2022   Hypomagnesemia 03/19/2022   Vitamin B 12 deficiency 03/19/2022   Low TSH level 03/19/2022   Right elbow pain 03/19/2022   Encounter for screening mammogram for malignant neoplasm of breast 03/19/2022   History of hypertension 12/13/2021   History of smoking 12/13/2021   Elevated blood pressure reading 12/13/2021   Contusion of right knee 12/13/2021   Fall 12/13/2021   Muscle cramps 12/04/2021   Elevated liver enzymes 12/04/2021   Paresthesias 12/04/2021   Elevated LFTs 12/04/2021   Anemia 10/20/2021   Chronic foot pain 10/20/2021   Folic acid  deficiency 09/19/2021   Chronic pain syndrome 09/07/2021   Status post lumbar spine surgery for decompression of spinal cord 05/24/2021   Dyspepsia 05/23/2021   History of gastroesophageal reflux (GERD) 05/23/2021   History of lumbar laminectomy for spinal cord decompression 04/26/2021   Lumbar stenosis 04/13/2021   Tension headache 12/22/2020   Low back pain 10/20/2020   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/28/2020   Hypokalemia 12/17/2019   Chronic diarrhea 09/14/2017   History of stroke 06/12/2014   Cervical radiculitis 02/12/2014   Essential hypertension, benign 01/24/2010   Microcytic anemia 01/11/2010   Hyperlipemia 05/28/2007   Current smoker 05/28/2007   GERD 05/28/2007    Past Surgical History:  Procedure Laterality Date   ABDOMINAL HYSTERECTOMY  12/09  Secondary to fibroids   ARTERY BIOPSY Right 04/10/2018   Procedure: BIOPSY TEMPORAL ARTERY;  Surgeon: Mayo Speck, MD;  Location: [facility] OR;  Service: Vascular;  Laterality: Right;   CESAREAN SECTION  1986; 1988   COLONOSCOPY     FOOT SURGERY Left 03/2017   Bone Spurs Removed   LUMBAR LAMINECTOMY/DECOMPRESSION MICRODISCECTOMY N/A 04/13/2021   Procedure: LUMBAR FOUR- LUMBAR FIVE AND LUMBAR FIVE-SACRAL ONE  DECOMPRESSION;  Surgeon: Adah Acron, MD;  Location: [facility] OR;  Service:  Orthopedics;  Laterality: N/A;   RADIOLOGY WITH ANESTHESIA N/A 04/26/2018   Procedure: MRI WITH ANESTHESIA;  Surgeon: Radiologist, Medication, MD;  Location: [facility] OR;  Service: Radiology;  Laterality: N/A;   RADIOLOGY WITH ANESTHESIA N/A 08/24/2019   Procedure: MRI WITH ANESTHESIA CERVICAL SPINE;  Surgeon: Radiologist, Medication, MD;  Location: [facility] OR;  Service: Radiology;  Laterality: N/A;   RADIOLOGY WITH ANESTHESIA N/A 10/02/2019   Procedure: MRI WITH ANESTHESIA CERVICAL WITHOUT CONTRAST;  Surgeon: Radiologist, Medication, MD;  Location: [facility] OR;  Service: Radiology;  Laterality: N/A;   TUBAL LIGATION  1988    OB History   No obstetric history on file.      Home Medications    Prior to Admission medications   Medication Sig Start Date End Date Taking? Authorizing Provider  ondansetron  (ZOFRAN -ODT) 8 MG disintegrating tablet Take 1 tablet (8 mg total) by mouth every 8 (eight) hours as needed for nausea or vomiting. 08/06/23  Yes Natsumi Whitsitt, Zana Hesselbach, NP  albuterol  (VENTOLIN  HFA) 108 (90 Base) MCG/ACT inhaler Inhale 2 puffs into the lungs every 6 (six) hours as needed for wheezing or shortness of breath. 04/25/23   Zorita Hiss, NP  amLODipine  (NORVASC ) 5 MG tablet Take 1 tablet (5 mg total) by mouth daily. 06/04/23   Henson, Vickie L, NP-C  diclofenac  Sodium (VOLTAREN ) 1 % GEL Apply 2 g topically 4 (four) times daily. 03/15/22   Henson, Vickie L, NP-C  diltiazem  2 % GEL Please apply 5 times daily 10/05/22   Tobin Forts, MD  folic acid  (FOLVITE ) 1 MG tablet Take 1 tablet (1 mg total) by mouth daily. 08/02/23   Henson, Vickie L, NP-C  lidocaine  (XYLOCAINE ) 5 % ointment Apply 1 Application topically 2 (two) times daily as needed. 08/03/23   Kennedy-Smith, Colleen M, NP  lipase/protease/amylase (CREON ) 36000 UNITS CPEP capsule Take 3 capsules (108,000 Units total) by mouth 3 (three) times daily with meals AND 2 capsules (72,000 Units total) with snacks. 10/05/22   Tobin Forts, MD  meloxicam  (MOBIC ) 15 MG  tablet Take 1 tablet (15 mg total) by mouth daily as needed for pain. 05/17/23   Roslyn Coombe, MD  omega-3 acid ethyl esters (LOVAZA ) 1 g capsule Take 2 capsules (2 g total) by mouth 2 (two) times daily. 04/23/23   Henson, Vickie L, NP-C  pantoprazole  (PROTONIX ) 40 MG tablet Take 1 tablet (40 mg total) by mouth 2 (two) times daily. Take 30 minutes before breakfast. 07/02/23   Kennedy-Smith, Colleen M, NP  potassium chloride  (KLOR-CON  M) 10 MEQ tablet Take 1 tablet (10 mEq total) by mouth daily. 06/04/23   Abram Abraham, NP-C    Family History Family History  Problem Relation Age of Onset   Pancreatic cancer Mother 32   Heart disease Mother    Esophageal cancer Father 13   Heart disease Sister    Breast cancer Maternal Aunt 60   Colon cancer Maternal Uncle    Pancreatic cancer Maternal Uncle  Colon cancer Paternal Uncle    Rectal cancer Neg Hx    Stomach cancer Neg Hx     Social History Social History   Tobacco Use   Smoking status: Some Days    Current packs/day: 0.25    Average packs/day: 0.3 packs/day for 38.0 years (9.5 ttl pk-yrs)    Types: Cigarettes   Smokeless tobacco: Never   Tobacco comments:    1 cigs/day 08/17/2022 km,cma  Vaping Use   Vaping status: Never Used  Substance Use Topics   Alcohol use: Yes    Alcohol/week: 4.0 standard drinks of alcohol    Types: 4 Cans of beer per week   Drug use: Not Currently     Allergies   Aspirin, Hydromorphone hcl, Crestor  [rosuvastatin ], and Latex   Review of Systems Review of Systems  Constitutional:  Positive for activity change, appetite change, chills and fatigue. Negative for diaphoresis and fever.  HENT:  Negative for congestion, ear discharge, ear pain, postnasal drip, rhinorrhea, sinus pressure, sinus pain, sore throat and trouble swallowing.   Eyes:  Negative for pain and discharge.  Respiratory:  Negative for cough, chest tightness and shortness of breath.   Gastrointestinal:  Positive for diarrhea and  nausea. Negative for abdominal pain and vomiting.  Genitourinary:  Negative for decreased urine volume, difficulty urinating and dysuria.  Musculoskeletal:  Positive for arthralgias and myalgias. Negative for neck stiffness.  Skin:  Negative for color change and rash.  Allergic/Immunologic: Negative for environmental allergies and food allergies.  Neurological:  Positive for headaches. Negative for dizziness, tremors, seizures, syncope, speech difficulty and numbness.  Hematological:  Negative for adenopathy. Does not bruise/bleed easily.     Physical Exam Triage Vital Signs ED Triage Vitals  Encounter Vitals Group     BP 08/06/23 0904 (!) 158/106     Systolic BP Percentile --      Diastolic BP Percentile --      Pulse Rate 08/06/23 0904 85     Resp 08/06/23 0904 18     Temp 08/06/23 0904 98.7 F (37.1 C)     Temp Source 08/06/23 0904 Oral     SpO2 08/06/23 0904 97 %     Weight --      Height --      Head Circumference --      Peak Flow --      Pain Score 08/06/23 0905 8     Pain Loc --      Pain Education --      Exclude from Growth Chart --    No data found.  Updated Vital Signs BP (!) 158/106 (BP Location: Right Arm)   Pulse 85   Temp 98.7 F (37.1 C) (Oral)   Resp 18   LMP  (LMP Unknown)   SpO2 97%   Visual Acuity Right Eye Distance:   Left Eye Distance:   Bilateral Distance:    Right Eye Near:   Left Eye Near:    Bilateral Near:     Physical Exam Vitals and nursing note reviewed.  Constitutional:      General: She is awake. She is not in acute distress.    Appearance: She is well-developed. She is ill-appearing.     Comments: She is sitting in the exam chair in no acute distress but appears tired and ill.   HENT:     Head: Normocephalic and atraumatic.     Right Ear: Hearing and external ear normal.     Left Ear:  Hearing and external ear normal.     Nose: Nose normal.     Mouth/Throat:     Lips: Pink.     Mouth: Mucous membranes are moist.      Pharynx: Oropharynx is clear.  Eyes:     Conjunctiva/sclera: Conjunctivae normal.  Cardiovascular:     Rate and Rhythm: Normal rate and regular rhythm.     Heart sounds: Normal heart sounds. No murmur heard. Pulmonary:     Effort: Pulmonary effort is normal. No tachypnea or respiratory distress.     Breath sounds: Normal breath sounds and air entry. No decreased air movement. No decreased breath sounds, wheezing, rhonchi or rales.  Abdominal:     General: Abdomen is flat. Bowel sounds are normal.     Palpations: Abdomen is soft.     Tenderness: There is no abdominal tenderness. There is no right CVA tenderness, left CVA tenderness, guarding or rebound.  Musculoskeletal:     Cervical back: Normal range of motion and neck supple.  Lymphadenopathy:     Cervical: No cervical adenopathy.  Skin:    General: Skin is warm and dry.     Capillary Refill: Capillary refill takes less than 2 seconds.     Findings: No rash.  Neurological:     General: No focal deficit present.     Mental Status: She is alert and oriented to person, place, and time.  Psychiatric:        Mood and Affect: Mood normal.        Behavior: Behavior normal. Behavior is cooperative.        Thought Content: Thought content normal.        Judgment: Judgment normal.      UC Treatments / Results  Labs (all labs ordered are listed, but only abnormal results are displayed) Labs Reviewed - No data to display  EKG   Radiology No results found.  Procedures Procedures (including critical care time)  Medications Ordered in UC Medications - No data to display  Initial Impression / Assessment and Plan / UC Course  I have reviewed the triage vital signs and the nursing notes.  Pertinent labs & imaging results that were available during my care of the patient were reviewed by me and considered in my medical decision making (see chart for details).     Reviewed with patient that she probably has a viral GI Illness.  Recommend start Zofran  8mg  every 8 hours as needed for nausea. Continue to push fluids, especially electrolyte solutions such as Gatorade or Powerade. May continue OTC Tylenol  1000mg  every 8 hours as needed for headache or chills. May continue Mobic  15mg  daily. Blood pressure elevated today due to not taking medication and due to illness. Encouraged to take her Norvasc  daily after taking Zofran  to minimize nausea and decrease chance of vomiting up BP meds. Slowly advance diet as tolerated. No acidic, spicy or fried foods. Note written for work. If nausea and symptoms do not improve within 48 hours or she is unable to keep down any fluids within 24 hours, go to the ER ASAP for further evaluation. Otherwise, follow-up with her PCP as planned.   Final Clinical Impressions(s) / UC Diagnoses   Final diagnoses:  Nausea without vomiting  Diarrhea, unspecified type  Chills  Elevated blood pressure reading in office with diagnosis of hypertension     Discharge Instructions      Recommend start Zofran  8mg  dissolving tablet- take 1 every 8 hours as needed for nausea.  Continue to push fluids with electrolytes such as Gatorade or Powerade. May continue OTC Tylenol  1000mg  every 8 hours as needed for headache or chills. May also continue Meloxicam  15mg  once daily. Also encouraged to take your blood pressure medication (Amlodipine ) since your blood pressure is elevated today. Slowly increase solid foods as tolerated. If your symptoms have not improved within 48 hours or you are unable to keep down any fluids within 24 hours, go to the ER ASAP. Otherwise, follow-up with your PCP as planned.      ED Prescriptions     Medication Sig Dispense Auth. Provider   ondansetron  (ZOFRAN -ODT) 8 MG disintegrating tablet Take 1 tablet (8 mg total) by mouth every 8 (eight) hours as needed for nausea or vomiting. 15 tablet Jacoby Zanni, Zana Hesselbach, NP      PDMP not reviewed this encounter.   Carlee Charters, NP 08/06/23  2330

## 2023-08-07 ENCOUNTER — Other Ambulatory Visit: Payer: Self-pay

## 2023-08-07 ENCOUNTER — Other Ambulatory Visit: Payer: Self-pay | Admitting: Internal Medicine

## 2023-08-08 ENCOUNTER — Ambulatory Visit: Payer: Self-pay | Admitting: Family Medicine

## 2023-08-08 NOTE — Telephone Encounter (Signed)
Chief Complaint: Body chills Symptoms: Sore throat, runny nose, vomiting Frequency: Intermittent Pertinent Negatives: Patient denies fever, chest pain, body pain Disposition: [] ED /[] Urgent Care (no appt availability in office) / [x] Appointment(In office/virtual)/ []  [facility]/ [] Home Care/ [] Refused Recommended Disposition /[] [facility]/ []  Follow-up with PCP Additional Notes: Pt went to urgent care on Sunday and was told she has a viral infection. Pt states the doctor at urgent care told her if she doesn't get to feeling better to call her PCP. Pt has been taking tylenol and meloxicam. Pt denies pain but states she has chills. Pt states this is the main symptom. Pt states she threw up today for the first time. Pt states she feels the same as Sunday, but she now has a sore throat. Pt scheduled for an appt tomorrow. This RN educated pt on home care, new-worsening symptoms, when to call back/seek emergent care. Pt verbalized understanding and agrees to plan.   Copied from CRM [id number]. Topic: Clinical - Red Word Triage >> Aug 08, 2023  9:40 AM Denese Killings wrote: Red Word that prompted transfer to Nurse Triage: Patient went to urgent care Sunday. She is still having the same symptoms.  Symptoms-cold chills, headaches, diarrhea, and sore throat. Reason for Disposition  [1] MODERATE pain (e.g., interferes with normal activities) AND [2] present > 3 days  Answer Assessment - Initial Assessment Questions 1. ONSET: "When did the chills start?"      Sunday 2. LOCATION: "What part of your body is hurting?" (e.g., entire body, arms, legs)      All over body chills 3. SEVERITY: "How bad is the pain?" (Scale 1-10; or mild, moderate, severe)   - MILD (1-3): doesn't interfere with normal activities    - MODERATE (4-7): interferes with normal activities or awakens from sleep    - SEVERE (8-10):  excruciating pain, unable to do any normal activities      Denies pain 5. FEVER:  "Have you been having fever?"     Denies  Protocols used: Muscle Aches and Body Pain-A-AH

## 2023-08-09 ENCOUNTER — Emergency Department (HOSPITAL_COMMUNITY)
Admission: EM | Admit: 2023-08-09 | Discharge: 2023-08-09 | Disposition: A | Discharge status: Home / Self Care | Payer: 59 | Attending: Emergency Medicine | Admitting: Emergency Medicine

## 2023-08-09 ENCOUNTER — Encounter: Payer: Self-pay | Admitting: Family Medicine

## 2023-08-09 ENCOUNTER — Telehealth: Payer: Self-pay | Admitting: Family Medicine

## 2023-08-09 ENCOUNTER — Other Ambulatory Visit: Payer: Self-pay

## 2023-08-09 ENCOUNTER — Ambulatory Visit (INDEPENDENT_AMBULATORY_CARE_PROVIDER_SITE_OTHER): Payer: 59 | Admitting: Family Medicine

## 2023-08-09 VITALS — BP 132/86 | HR 100 | Temp 98.3°F | Ht 59.0 in | Wt 135.0 lb

## 2023-08-09 DIAGNOSIS — I10 Essential (primary) hypertension: Secondary | ICD-10-CM | POA: Insufficient documentation

## 2023-08-09 DIAGNOSIS — R112 Nausea with vomiting, unspecified: Secondary | ICD-10-CM | POA: Diagnosis not present

## 2023-08-09 DIAGNOSIS — Z91040 Latex allergy status: Secondary | ICD-10-CM | POA: Insufficient documentation

## 2023-08-09 DIAGNOSIS — R000 Tachycardia, unspecified: Secondary | ICD-10-CM

## 2023-08-09 DIAGNOSIS — R6883 Chills (without fever): Secondary | ICD-10-CM

## 2023-08-09 DIAGNOSIS — R197 Diarrhea, unspecified: Secondary | ICD-10-CM | POA: Diagnosis not present

## 2023-08-09 DIAGNOSIS — I951 Orthostatic hypotension: Secondary | ICD-10-CM

## 2023-08-09 DIAGNOSIS — E876 Hypokalemia: Secondary | ICD-10-CM | POA: Diagnosis not present

## 2023-08-09 DIAGNOSIS — E860 Dehydration: Secondary | ICD-10-CM

## 2023-08-09 LAB — COMPREHENSIVE METABOLIC PANEL
ALT: 65 U/L — ABNORMAL HIGH (ref 0–44)
AST: 41 U/L (ref 15–41)
Albumin: 4 g/dL (ref 3.5–5.0)
Alkaline Phosphatase: 67 U/L (ref 38–126)
Anion gap: 14 (ref 5–15)
BUN: 13 mg/dL (ref 8–23)
CO2: 23 mmol/L (ref 22–32)
Calcium: 9.2 mg/dL (ref 8.9–10.3)
Chloride: 107 mmol/L (ref 98–111)
Creatinine, Ser: 0.71 mg/dL (ref 0.44–1.00)
GFR, Estimated: >60 mL/min (ref >60)
Glucose, Bld: 143 mg/dL — ABNORMAL HIGH (ref 70–99)
Potassium: 3 mmol/L — ABNORMAL LOW (ref 3.5–5.1)
Sodium: 144 mmol/L (ref 135–145)
Total Bilirubin: 0.8 mg/dL (ref 0.0–1.2)
Total Protein: 7.8 g/dL (ref 6.5–8.1)

## 2023-08-09 LAB — CBC
HCT: 37 % (ref 36.0–46.0)
Hemoglobin: 11.6 g/dL — ABNORMAL LOW (ref 12.0–15.0)
MCH: 23.2 pg — ABNORMAL LOW (ref 26.0–34.0)
MCHC: 31.4 g/dL (ref 30.0–36.0)
MCV: 74 fL — ABNORMAL LOW (ref 80.0–100.0)
Platelets: 308 10*3/uL (ref 150–400)
RBC: 5 MIL/uL (ref 3.87–5.11)
RDW: 14.2 % (ref 11.5–15.5)
WBC: 7.3 10*3/uL (ref 4.0–10.5)
nRBC: 0 % (ref 0.0–0.2)

## 2023-08-09 LAB — POCT INFLUENZA A/B
Influenza A, POC: NEGATIVE
Influenza B, POC: NEGATIVE

## 2023-08-09 LAB — LIPASE, BLOOD: Lipase: 23 U/L (ref 11–51)

## 2023-08-09 LAB — POC COVID19 BINAXNOW: SARS Coronavirus 2 Ag: NEGATIVE

## 2023-08-09 MED ORDER — ONDANSETRON HCL 4 MG PO TABS
8.0000 mg | ORAL_TABLET | Freq: Once | ORAL | Status: AC
  Administered 2023-08-09: 8 mg via ORAL

## 2023-08-09 MED ORDER — SODIUM CHLORIDE 0.9 % IV BOLUS
1000.0000 mL | Freq: Once | INTRAVENOUS | Status: AC
  Administered 2023-08-09: 1000 mL via INTRAVENOUS

## 2023-08-09 MED ORDER — PROCHLORPERAZINE MALEATE 10 MG PO TABS: 10.0000 mg | ORAL_TABLET | Freq: Two times a day (BID) | ORAL | 0 refills | Status: AC | PRN

## 2023-08-09 MED ORDER — POTASSIUM CHLORIDE 20 MEQ PO PACK
60.0000 meq | PACK | Freq: Once | ORAL | Status: AC
  Administered 2023-08-09: 60 meq via ORAL
  Filled 2023-08-09: qty 3

## 2023-08-09 NOTE — Telephone Encounter (Signed)
FYI

## 2023-08-09 NOTE — Discharge Instructions (Addendum)
You were seen in the emergency department today for vomiting and diarrhea.  As we discussed, your lab work was reassuring. I suspect that you likely have a stomach virus, which we often call gastroenteritis. We treat this with controlling your symptoms and good hydration. I have sent a prescription of a different nausea medication to your pharmacy. You can use whichever works better for you.   I recommend trying to drink plenty of water and starting with eating bland foods, all as tolerated. You can slowly work your way up to more substantial food after that.  Continue to monitor how you're doing and return to the ER for new or worsening symptoms such as fever, worsening pain, inability to keep anything down, etc.

## 2023-08-09 NOTE — Progress Notes (Signed)
Subjective:     Patient ID: [name], female    DOB: 05-Jul-1960, 63 y.o.   MRN: [id number]  Chief Complaint  Patient presents with   Chills    Started sunday   Diarrhea   Headache   Nausea    Diarrhea  Associated symptoms include abdominal pain, chills, headaches, myalgias and vomiting. Pertinent negatives include no fever.  Headache  Associated symptoms include abdominal pain, nausea, vomiting and weakness. Pertinent negatives include no dizziness or fever.     History of Present Illness         C/o chills, headache, nausea, vomiting, abdominal pain and diarrhea x 5 days.   Reports reduced urination  She went to UC 3 days ago and diagnosed with viral GI illness.  She has been taking Zofran.  Unable to keep fluids down.  Tachycardic  States she feels dehydrated.     Health Maintenance Due  Topic Date Due   MAMMOGRAM  10/22/2021   COVID-19 Vaccine (4 - 2024-25 season) 02/25/2023    Past Medical History:  Diagnosis Date   Anemia    Arthritis    "neck, hips" (04/25/2018)   Claustrophobia    Daily headache    GERD (gastroesophageal reflux disease)    Hiatal hernia    Hyperlipidemia    "hx" (04/25/2018)   Hypertension    Plantar fasciitis    hx - left foot   Stroke (HCC) 05/2014   "mini stroke" (04/25/2018)    Past Surgical History:  Procedure Laterality Date   ABDOMINAL HYSTERECTOMY  12/09   Secondary to fibroids   ARTERY BIOPSY Right 04/10/2018   Procedure: BIOPSY TEMPORAL ARTERY;  Surgeon: Larina Earthly, MD;  Location: [facility] OR;  Service: Vascular;  Laterality: Right;   CESAREAN SECTION  1986; 1988   COLONOSCOPY     FOOT SURGERY Left 03/2017   Bone Spurs Removed   LUMBAR LAMINECTOMY/DECOMPRESSION MICRODISCECTOMY N/A 04/13/2021   Procedure: LUMBAR FOUR- LUMBAR FIVE AND LUMBAR FIVE-SACRAL ONE  DECOMPRESSION;  Surgeon: Eldred Manges, MD;  Location: [facility] OR;  Service: Orthopedics;  Laterality: N/A;   RADIOLOGY WITH ANESTHESIA N/A 04/26/2018    Procedure: MRI WITH ANESTHESIA;  Surgeon: Radiologist, Medication, MD;  Location: [facility] OR;  Service: Radiology;  Laterality: N/A;   RADIOLOGY WITH ANESTHESIA N/A 08/24/2019   Procedure: MRI WITH ANESTHESIA CERVICAL SPINE;  Surgeon: Radiologist, Medication, MD;  Location: [facility] OR;  Service: Radiology;  Laterality: N/A;   RADIOLOGY WITH ANESTHESIA N/A 10/02/2019   Procedure: MRI WITH ANESTHESIA CERVICAL WITHOUT CONTRAST;  Surgeon: Radiologist, Medication, MD;  Location: [facility] OR;  Service: Radiology;  Laterality: N/A;   TUBAL LIGATION  1988    Family History  Problem Relation Age of Onset   Pancreatic cancer Mother 62   Heart disease Mother    Esophageal cancer Father 6   Heart disease Sister    Breast cancer Maternal Aunt 60   Colon cancer Maternal Uncle    Pancreatic cancer Maternal Uncle    Colon cancer Paternal Uncle    Rectal cancer Neg Hx    Stomach cancer Neg Hx     Social History   Socioeconomic History   Marital status: Divorced    Spouse name: Not on file   Number of children: 2   Years of education: Not on file   Highest education level: Not on file  Occupational History   Not on file  Tobacco Use   Smoking status: Some Days    Current packs/day:  0.25    Average packs/day: 0.3 packs/day for 38.0 years (9.5 ttl pk-yrs)    Types: Cigarettes   Smokeless tobacco: Never   Tobacco comments:    1 cigs/day 08/17/2022 km,cma  Vaping Use   Vaping status: Never Used  Substance and Sexual Activity   Alcohol use: Yes    Alcohol/week: 4.0 standard drinks of alcohol    Types: 4 Cans of beer per week   Drug use: Not Currently   Sexual activity: Not Currently    Birth control/protection: Surgical    Comment: Hysterectomy  Other Topics Concern   Not on file  Social History Narrative   10 th grade education.   Social Drivers of Corporate investment banker Strain: Not on file  Food Insecurity: Not on file  Transportation Needs: No Transportation Needs (08/27/2019)   PRAPARE -  Administrator, Civil Service (Medical): No    Lack of Transportation (Non-Medical): No  Physical Activity: Not on file  Stress: Not on file  Social Connections: Not on file  Intimate Partner Violence: Not on file    Outpatient Medications Prior to Visit  Medication Sig Dispense Refill   albuterol (VENTOLIN HFA) 108 (90 Base) MCG/ACT inhaler Inhale 2 puffs into the lungs every 6 (six) hours as needed for wheezing or shortness of breath. 8 g 0   amLODipine (NORVASC) 5 MG tablet Take 1 tablet (5 mg total) by mouth daily. 90 tablet 1   diclofenac Sodium (VOLTAREN) 1 % GEL Apply 2 g topically 4 (four) times daily. 200 g 1   diltiazem 2 % GEL Please apply 5 times daily 30 g 1   folic acid (FOLVITE) 1 MG tablet Take 1 tablet (1 mg total) by mouth daily. 30 tablet 2   lidocaine (XYLOCAINE) 5 % ointment Apply 1 Application topically 2 (two) times daily as needed. 35.44 g 0   lipase/protease/amylase (CREON) 36000 UNITS CPEP capsule Take 3 capsules (108,000 Units total) by mouth 3 (three) times daily with meals AND 2 capsules (72,000 Units total) with snacks. 390 capsule 11   meloxicam (MOBIC) 15 MG tablet TAKE 1 TABLET BY MOUTH ONCE DAILY AS NEEDED FOR PAIN 30 tablet 0   omega-3 acid ethyl esters (LOVAZA) 1 g capsule Take 2 capsules (2 g total) by mouth 2 (two) times daily. 120 capsule 1   ondansetron (ZOFRAN-ODT) 8 MG disintegrating tablet Take 1 tablet (8 mg total) by mouth every 8 (eight) hours as needed for nausea or vomiting. 15 tablet 0   pantoprazole (PROTONIX) 40 MG tablet Take 1 tablet (40 mg total) by mouth 2 (two) times daily. Take 30 minutes before breakfast. 60 tablet 2   potassium chloride (KLOR-CON M) 10 MEQ tablet Take 1 tablet (10 mEq total) by mouth daily. 30 tablet 2   No facility-administered medications prior to visit.    Allergies  Allergen Reactions   Aspirin Hives   Hydromorphone Hcl Hives and Nausea And Vomiting    Can take Norco   Crestor [Rosuvastatin]      myalgias   Latex Rash    Review of Systems  Constitutional:  Positive for chills and malaise/fatigue. Negative for fever.  Respiratory:  Negative for shortness of breath.   Cardiovascular:  Negative for chest pain, palpitations and leg swelling.  Gastrointestinal:  Positive for abdominal pain, diarrhea, nausea and vomiting. Negative for constipation.  Genitourinary:  Negative for dysuria, frequency and urgency.  Musculoskeletal:  Positive for myalgias.  Neurological:  Positive for weakness  and headaches. Negative for dizziness and focal weakness.       Objective:    Physical Exam Constitutional:      General: She is not in acute distress.    Appearance: She is ill-appearing.  HENT:     Mouth/Throat:     Mouth: Mucous membranes are dry.  Eyes:     Extraocular Movements: Extraocular movements intact.     Conjunctiva/sclera: Conjunctivae normal.  Cardiovascular:     Rate and Rhythm: Regular rhythm. Tachycardia present.  Pulmonary:     Effort: Pulmonary effort is normal.  Abdominal:     General: There is no distension.     Palpations: Abdomen is soft.     Tenderness: There is no abdominal tenderness. There is no guarding or rebound.  Musculoskeletal:     Cervical back: Normal range of motion and neck supple.  Skin:    General: Skin is warm and dry.  Neurological:     General: No focal deficit present.     Mental Status: She is alert and oriented to person, place, and time.  Psychiatric:        Mood and Affect: Mood normal.        Behavior: Behavior normal.        Thought Content: Thought content normal.      BP 132/86 (BP Location: Left Arm, Patient Position: Sitting)   Pulse 100   Temp 98.3 F (36.8 C) (Temporal)   Ht 4\' 11"  (1.499 m)   Wt 135 lb (61.2 kg)   LMP  (LMP Unknown)   SpO2 98%   BMI 27.27 kg/m  Wt Readings from Last 3 Encounters:  08/09/23 135 lb (61.2 kg)  07/26/23 137 lb (62.1 kg)  06/08/23 141 lb (64 kg)       Assessment & Plan:    Problem List Items Addressed This Visit   None Visit Diagnoses       Orthostatic hypotension    -  Primary     Chills       Relevant Orders   POC COVID-19 BinaxNow (Completed)   POCT Influenza A/B (Completed)     Nausea and vomiting, unspecified vomiting type       Relevant Medications   ondansetron (ZOFRAN) tablet 8 mg (Completed)     Diarrhea, unspecified type         Tachycardia         Dehydration          Reviewed notes from urgent care on 08/06/2023. She is negative for COVID and flu in our office today. Here today for 5-day history of nausea, vomiting and diarrhea.  Unable to keep fluids down even with taking Zofran regularly.  Reports feeling dehydrated with decreased urine output. She is orthostatic. Zofran 8 mg ODT given in office at 10:35 AM.  She has not taken Zofran this morning. She is dry heaving.  Ill-appearing. Discussed that she may have norovirus.  Probable electrolyte derangement due to 5-day course of vomiting diarrhea EMS called to transport to the ED for further evaluation and treatment.   I am having [name] maintain her diclofenac Sodium, lipase/protease/amylase, diltiazem, omega-3 acid ethyl esters, albuterol, amLODipine, potassium chloride, pantoprazole, folic acid, lidocaine, ondansetron, and meloxicam. We administered ondansetron.  Meds ordered this encounter  Medications   ondansetron (ZOFRAN) tablet 8 mg

## 2023-08-09 NOTE — ED Triage Notes (Signed)
Pt c/o N/V/D for several days. Was given Zofran by PCP today and nausea has improved. PCP concerned for dehydration.  AOx4

## 2023-08-09 NOTE — ED Provider Triage Note (Signed)
Emergency Medicine Provider Triage Evaluation Note  [name] , a 63 y.o. female  was evaluated in triage.  Pt complains of n/v/d. Pt report having n/v/d x 4 days, which has improved.  Was seen by pcp today but sent here with concerns for dehydration.  Pt sts she felt better after zofran. No abd pain, no fever, or dysuria  Review of Systems  Positive: As above Negative: As above  Physical Exam  BP (!) 145/104 (BP Location: Left Arm)   Pulse (!) 101   Temp 99.5 F (37.5 C) (Oral)   Resp 17   LMP  (LMP Unknown)   SpO2 100%  Gen:   Awake, no distress   Resp:  Normal effort  MSK:   Moves extremities without difficulty  Other:    Medical Decision Making  Medically screening exam initiated at 12:15 PM.  Appropriate orders placed.  [name] was informed that the remainder of the evaluation will be completed by another provider, this initial triage assessment does not replace that evaluation, and the importance of remaining in the ED until their evaluation is complete.     Fayrene Helper, PA-C 08/09/23 1216

## 2023-08-09 NOTE — Telephone Encounter (Signed)
Copied from CRM [id number]. Topic: General - Other >> Aug 09, 2023 11:54 AM Taleah C wrote: Reason for CRM: pt called to let Vickie know that she has arrived to the ED. Please advise.

## 2023-08-09 NOTE — ED Provider Notes (Signed)
[place] EMERGENCY DEPARTMENT AT [facility] Provider Note   CSN: [id number] Arrival date & time: 08/09/23  1140     History  Chief Complaint  Patient presents with   Emesis   Diarrhea    [name] is a 63 y.o. female with history of anemia, GERD, hiatal hernia, hyperlipidemia, hypertension, chronic diarrhea on Creon, who presents the emergency department complaining of nausea, vomiting, diarrhea for about a week or so.  She went to urgent care the other day and was discharged with Zofran.  She went to her PCP today for follow-up and had persistent symptoms.  She was orthostatic.  They gave her some Zofran.  She says at this point her nausea has significantly improved, she only has a mild headache.  She is not having any abdominal pain.   Emesis Associated symptoms: diarrhea   Diarrhea Associated symptoms: vomiting        Home Medications Prior to Admission medications   Medication Sig Start Date End Date Taking? Authorizing Provider  prochlorperazine (COMPAZINE) 10 MG tablet Take 1 tablet (10 mg total) by mouth 2 (two) times daily as needed for nausea or vomiting. 08/09/23  Yes Genaro Bekker T, PA-C  albuterol (VENTOLIN HFA) 108 (90 Base) MCG/ACT inhaler Inhale 2 puffs into the lungs every 6 (six) hours as needed for wheezing or shortness of breath. 04/25/23   Elenore Paddy, NP  amLODipine (NORVASC) 5 MG tablet Take 1 tablet (5 mg total) by mouth daily. 06/04/23   Henson, Vickie L, NP-C  diclofenac Sodium (VOLTAREN) 1 % GEL Apply 2 g topically 4 (four) times daily. 03/15/22   Henson, Vickie L, NP-C  diltiazem 2 % GEL Please apply 5 times daily 10/05/22   Hilarie Fredrickson, MD  folic acid (FOLVITE) 1 MG tablet Take 1 tablet (1 mg total) by mouth daily. 08/02/23   Henson, Vickie L, NP-C  lidocaine (XYLOCAINE) 5 % ointment Apply 1 Application topically 2 (two) times daily as needed. 08/03/23   Arnaldo Natal, NP  lipase/protease/amylase (CREON) 36000 UNITS  CPEP capsule Take 3 capsules (108,000 Units total) by mouth 3 (three) times daily with meals AND 2 capsules (72,000 Units total) with snacks. 10/05/22   Hilarie Fredrickson, MD  meloxicam (MOBIC) 15 MG tablet TAKE 1 TABLET BY MOUTH ONCE DAILY AS NEEDED FOR PAIN 08/07/23   Hetty Blend L, NP-C  omega-3 acid ethyl esters (LOVAZA) 1 g capsule Take 2 capsules (2 g total) by mouth 2 (two) times daily. 04/23/23   Henson, Vickie L, NP-C  ondansetron (ZOFRAN-ODT) 8 MG disintegrating tablet Take 1 tablet (8 mg total) by mouth every 8 (eight) hours as needed for nausea or vomiting. 08/06/23   Amyot, Ali Lowe, NP  pantoprazole (PROTONIX) 40 MG tablet Take 1 tablet (40 mg total) by mouth 2 (two) times daily. Take 30 minutes before breakfast. 07/02/23   Arnaldo Natal, NP  potassium chloride (KLOR-CON M) 10 MEQ tablet Take 1 tablet (10 mEq total) by mouth daily. 06/04/23   Henson, Vickie L, NP-C      Allergies    Aspirin, Hydromorphone hcl, Crestor [rosuvastatin], and Latex    Review of Systems   Review of Systems  Gastrointestinal:  Positive for diarrhea, nausea and vomiting.  All other systems reviewed and are negative.   Physical Exam Updated Vital Signs BP (!) 145/104 (BP Location: Left Arm)   Pulse (!) 101   Temp 99.5 F (37.5 C) (Oral)   Resp 17  LMP  (LMP Unknown)   SpO2 100%  Physical Exam Vitals and nursing note reviewed.  Constitutional:      Appearance: Normal appearance.  HENT:     Head: Normocephalic and atraumatic.  Eyes:     Conjunctiva/sclera: Conjunctivae normal.  Cardiovascular:     Rate and Rhythm: Regular rhythm. Tachycardia present.  Pulmonary:     Effort: Pulmonary effort is normal. No respiratory distress.     Breath sounds: Normal breath sounds.  Abdominal:     General: There is no distension.     Palpations: Abdomen is soft.     Tenderness: There is no abdominal tenderness.  Skin:    General: Skin is warm and dry.  Neurological:     General: No focal  deficit present.     Mental Status: She is alert.     ED Results / Procedures / Treatments   Labs (all labs ordered are listed, but only abnormal results are displayed) Labs Reviewed  COMPREHENSIVE METABOLIC PANEL - Abnormal; Notable for the following components:      Result Value   Potassium 3.0 (*)    Glucose, Bld 143 (*)    ALT 65 (*)    All other components within normal limits  CBC - Abnormal; Notable for the following components:   Hemoglobin 11.6 (*)    MCV 74.0 (*)    MCH 23.2 (*)    All other components within normal limits  LIPASE, BLOOD  URINALYSIS, ROUTINE W REFLEX MICROSCOPIC    EKG None  Radiology No results found.  Procedures Procedures    Medications Ordered in ED Medications  sodium chloride 0.9 % bolus 1,000 mL (1,000 mLs Intravenous New Bag/Given 08/09/23 1412)  potassium chloride (KLOR-CON) packet 60 mEq (60 mEq Oral Given 08/09/23 1438)    ED Course/ Medical Decision Making/ A&P                                 Medical Decision Making Amount and/or Complexity of Data Reviewed Labs: ordered.  Risk Prescription drug management.   This patient is a 63 y.o. female  who presents to the ED for concern of vomiting and diarrhea.   Differential diagnoses prior to evaluation: The emergent differential diagnosis includes, but is not limited to,  ACS/MI, Boerhaave's, DKA, elevated ICP, Ischemic bowel, Sepsis, drug-related, Appendicitis, Bowel obstruction, Electrolyte abnormalities, Pancreatitis, Biliary colic, Gastroenteritis, Gastroparesis, Hepatitis, Migraine, Thyroid disease, Renal colic, PUD. This is not an exhaustive differential.   Past Medical History / Co-morbidities / Social History: anemia, GERD, hiatal hernia, hyperlipidemia, hypertension, chronic diarrhea on Creon  Additional history: Chart reviewed. Pertinent results include: Reviewed UC note from 2/10 and PCP visit note from today. Received 8 mg zofran at 1045 this AM.   Physical  Exam: Physical exam performed. The pertinent findings include: mildly tachycardic, otherwise normal vitals. Abdomen soft and non-tender.   Lab Tests/Imaging studies: I personally interpreted labs/imaging and the pertinent results include: No leukocytosis, hemoglobin at baseline.  Potassium 3.0, otherwise CMP unremarkable.  Normal lipase.  As patient has relatively reassuring laboratory evaluation and benign abdominal exam, will defer abdominal imaging at this time.   Medications: I ordered medication including BF, potassium replacement.  I have reviewed the patients home medicines and have made adjustments as needed.   Disposition: After consideration of the diagnostic results and the patients response to treatment, I feel that emergency department workup does not suggest an emergent condition  requiring admission or immediate intervention beyond what has been performed at this time. The plan is: Discharge to home.  Patient feeling much better after IV fluids.  Will also send additional antiemetic prescription if Zofran at home was not enough.. The patient is safe for discharge and has been instructed to return immediately for worsening symptoms, change in symptoms or any other concerns.  Final Clinical Impression(s) / ED Diagnoses Final diagnoses:  Nausea vomiting and diarrhea  Hypokalemia    Rx / DC Orders ED Discharge Orders          Ordered    prochlorperazine (COMPAZINE) 10 MG tablet  2 times daily PRN        08/09/23 1429           Portions of this report may have been transcribed using voice recognition software. Every effort was made to ensure accuracy; however, inadvertent computerized transcription errors may be present.   Su Monks, PA-C 08/09/23 1455    Melene Plan, DO 08/09/23 1500

## 2023-08-13 ENCOUNTER — Telehealth: Payer: Self-pay

## 2023-08-13 NOTE — Transitions of Care (Post Inpatient/ED Visit) (Signed)
   08/13/2023  Name: [name] MRN: [id number] DOB: 05/27/1961  Today's TOC FU Call Status:    Attempted to reach the patient regarding the most recent Inpatient/ED visit.  Follow Up Plan: Additional outreach attempts will be made to reach the patient to complete the Transitions of Care (Post Inpatient/ED visit) call.   Signature Elyse Jarvis, [place]

## 2023-08-14 NOTE — Transitions of Care (Post Inpatient/ED Visit) (Signed)
error 

## 2023-08-15 NOTE — Transitions of Care (Post Inpatient/ED Visit) (Signed)
   08/15/2023  Name: [name] MRN: [id number] DOB: Aug 16, 1960  Today's TOC FU Call Status: Today's TOC FU Call Status:: Unsuccessful Call (2nd Attempt) Unsuccessful Call (1st Attempt) Date: 08/13/23 Unsuccessful Call (2nd Attempt) Date: 08/15/23  Attempted to reach the patient regarding the most recent Inpatient/ED visit.  Follow Up Plan: One more Additional outreach attempts will be made to reach the patient to complete the Transitions of Care (Post Inpatient/ED visit) call.   Signature Elyse Jarvis, [place]

## 2023-08-27 ENCOUNTER — Other Ambulatory Visit (HOSPITAL_COMMUNITY): Payer: Self-pay

## 2023-09-06 ENCOUNTER — Other Ambulatory Visit (HOSPITAL_COMMUNITY): Payer: Self-pay

## 2023-09-07 ENCOUNTER — Other Ambulatory Visit (HOSPITAL_COMMUNITY): Payer: Self-pay

## 2023-09-12 ENCOUNTER — Ambulatory Visit: Payer: 59 | Admitting: Gastroenterology

## 2023-09-18 ENCOUNTER — Other Ambulatory Visit: Payer: Self-pay | Admitting: Nurse Practitioner

## 2023-09-25 ENCOUNTER — Telehealth: Payer: Self-pay | Admitting: Nurse Practitioner

## 2023-09-25 NOTE — Telephone Encounter (Signed)
 Referral was sent over to [facility]. Patient has been notified.

## 2023-09-25 NOTE — Telephone Encounter (Signed)
 Attempted to contact patient and left a voicemail to return call.

## 2023-09-25 NOTE — Telephone Encounter (Signed)
 Patient called and stated that DD wanted her to get in contact with [facility], and patient stated she did and representative who she talked to informed her that we need to fax over her paperwork at [phone]. Please advise.

## 2023-09-25 NOTE — Telephone Encounter (Signed)
 Inbound call from patient stating she had a missed call from DD and was returning the call. Please advise.

## 2023-09-27 NOTE — Telephone Encounter (Signed)
 Patient requesting f/u call in regards to referral to [facility]. Please advise.

## 2023-09-27 NOTE — Telephone Encounter (Signed)
 Contacted patient and advised patient that [facility] would give her a call within the next 2 weeks regarding an appointment.

## 2023-10-01 ENCOUNTER — Other Ambulatory Visit (HOSPITAL_COMMUNITY): Payer: Self-pay

## 2023-10-01 ENCOUNTER — Other Ambulatory Visit: Payer: Self-pay | Admitting: Family Medicine

## 2023-10-01 ENCOUNTER — Other Ambulatory Visit: Payer: Self-pay | Admitting: Nurse Practitioner

## 2023-10-01 MED ORDER — POTASSIUM CHLORIDE CRYS ER 10 MEQ PO TBCR
10.0000 meq | EXTENDED_RELEASE_TABLET | Freq: Every day | ORAL | 2 refills | Status: DC
  Filled 2023-10-01: qty 30, 30d supply, fill #0
  Filled 2023-11-05: qty 30, 30d supply, fill #1
  Filled 2023-12-06: qty 30, 30d supply, fill #2

## 2023-10-02 ENCOUNTER — Other Ambulatory Visit: Payer: Self-pay

## 2023-10-02 ENCOUNTER — Other Ambulatory Visit (HOSPITAL_COMMUNITY): Payer: Self-pay

## 2023-10-02 MED ORDER — PANTOPRAZOLE SODIUM 40 MG PO TBEC
40.0000 mg | DELAYED_RELEASE_TABLET | Freq: Two times a day (BID) | ORAL | 2 refills | Status: DC
  Filled 2023-10-02: qty 60, 30d supply, fill #0
  Filled 2023-11-05: qty 60, 30d supply, fill #1
  Filled 2023-12-06: qty 60, 30d supply, fill #2

## 2023-10-03 ENCOUNTER — Other Ambulatory Visit (HOSPITAL_COMMUNITY): Payer: Self-pay

## 2023-10-10 ENCOUNTER — Ambulatory Visit (INDEPENDENT_AMBULATORY_CARE_PROVIDER_SITE_OTHER): Admitting: Physician Assistant

## 2023-10-10 ENCOUNTER — Other Ambulatory Visit (HOSPITAL_COMMUNITY): Payer: Self-pay

## 2023-10-10 ENCOUNTER — Encounter: Payer: Self-pay | Admitting: Physician Assistant

## 2023-10-10 DIAGNOSIS — Z9889 Other specified postprocedural states: Secondary | ICD-10-CM | POA: Diagnosis not present

## 2023-10-10 MED ORDER — METHYLPREDNISOLONE 4 MG PO TBPK
ORAL_TABLET | ORAL | 0 refills | Status: DC
Start: 2023-10-10 — End: 2024-01-25
  Filled 2023-10-10: qty 21, 6d supply, fill #0

## 2023-10-10 NOTE — Progress Notes (Signed)
 Office Visit Note   Patient: [name]           Date of Birth: [birth year]           MRN: [id number] Visit Date: 10/10/2023              Requested by: Avanell Shackleton, NP-C [address] [place],  [place] [address] PCP: Avanell Shackleton, NP-C  No chief complaint on file.     HPI: [name] is a pleasant 63 year old woman who was previously followed by Dr. Ophelia Charter.  She is roughly 3 years status post L4-5 L5-S1 decompression she periodically has flares in some of her symptoms.  She works as a Data processing manager.  Last summer I saw her and she had significant flare running down her leg.  She took a Medrol Dosepak felt much better.  She has now had recurrence of the same symptoms.  No loss of bowel or bladder control pain is at her low back and radiates down into her left leg  Assessment & Plan: Visit Diagnoses:  1. Status post lumbar spine surgery for decompression of spinal cord     Plan: Patient with a history of claudication and L4-5 5 S1 decompression.  Periodically has flares.  She is having a flare very similar to what she had last summer she is neurovascular neurologically intact.  She does have a positive straight leg raise.  She would like to try another steroid Dosepak I think this would be appropriate would have her follow-up with Megan in 3 weeks  Follow-Up Instructions: No follow-ups on file.   Ortho Exam  Patient is alert, oriented, no adenopathy, well-dressed, normal affect, normal respiratory effort. Examination of her lower back well-healed surgical incision no evidence of any step-offs or erythema or infection or fluctuance.  Strength is intact however she is quite tender especially with straight leg raise on the left.  Has good dorsiflexion plantarflexion extension and flexion of her legs  Imaging: No results found. No images are attached to the encounter.  Labs: Lab Results  Component Value Date   HGBA1C 5.9 10/20/2021   HGBA1C 5.2 08/21/2019    HGBA1C 5.1 01/09/2018   ESRSEDRATE 33 (H) 12/22/2020   ESRSEDRATE 89 (H) 09/25/2019   ESRSEDRATE 30 04/25/2018   CRP <1.0 06/07/2022   CRP <1.0 08/18/2021   CRP 5 09/25/2019   REPTSTATUS 05/19/2014 FINAL 05/17/2014   CULT  05/17/2014    Multiple bacterial morphotypes present, none predominant. Suggest appropriate recollection if clinically indicated. Performed at [facility]    LABORGA NO GROWTH 5 DAYS 04/30/2013     Lab Results  Component Value Date   ALBUMIN 4.0 08/09/2023   ALBUMIN 4.1 03/19/2023   ALBUMIN 4.3 01/05/2023    Lab Results  Component Value Date   MG 1.1 (L) 08/03/2022   MG 1.5 03/15/2022   MG 1.3 (L) 12/02/2021   Lab Results  Component Value Date   VD25OH 24.50 (L) 06/07/2022    No results found for: "PREALBUMIN"    Latest Ref Rng & Units 08/09/2023   12:17 PM 07/26/2023    3:12 PM 04/20/2023    3:45 PM  CBC EXTENDED  WBC 4.0 - 10.5 K/uL 7.3  5.4  6.6   RBC 3.87 - 5.11 MIL/uL 5.00  4.65  4.57   Hemoglobin 12.0 - 15.0 g/dL 19.1  47.8  29.5   HCT 36.0 - 46.0 % 37.0  34.3  33.1   Platelets 150 -  400 K/uL 308  279  295.0   NEUT# 1.7 - 7.7 K/uL  2.4  3.2   Lymph# 0.7 - 4.0 K/uL  2.5  2.5      There is no height or weight on file to calculate BMI.  Orders:  No orders of the defined types were placed in this encounter.  Meds ordered this encounter  Medications   methylPREDNISolone (MEDROL DOSEPAK) 4 MG TBPK tablet    Sig: Take as directed with food    Dispense:  21 tablet    Refill:  0     Procedures: No procedures performed  Clinical Data: No additional findings.  ROS:  All other systems negative, except as noted in the HPI. Review of Systems  Objective: Vital Signs: LMP  (LMP Unknown)   Specialty Comments:  No specialty comments available.  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   Cough due to ACE inhibitor 05/16/2022   Myalgia 05/16/2022   Acute bronchitis 05/16/2022   Hypomagnesemia 03/19/2022   Vitamin B 12 deficiency 03/19/2022   Low TSH level 03/19/2022   Right elbow pain 03/19/2022   Encounter for screening mammogram for malignant neoplasm of breast 03/19/2022   History of hypertension 12/13/2021   History of smoking 12/13/2021   Elevated blood pressure reading 12/13/2021   Contusion of right knee 12/13/2021   Fall 12/13/2021   Muscle cramps 12/04/2021   Elevated liver enzymes 12/04/2021   Paresthesias 12/04/2021   Elevated LFTs 12/04/2021   Anemia 10/20/2021   Chronic foot pain 10/20/2021   Folic acid deficiency 09/19/2021   Chronic pain syndrome 09/07/2021   Status post lumbar spine surgery for decompression of spinal cord 05/24/2021   Dyspepsia 05/23/2021   History of gastroesophageal reflux (GERD) 05/23/2021   History of lumbar laminectomy for spinal cord decompression 04/26/2021   Lumbar stenosis 04/13/2021   Tension headache 12/22/2020   Low back pain 10/20/2020   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/28/2020   Hypokalemia 12/17/2019   Chronic diarrhea 09/14/2017   History of stroke 06/12/2014   Cervical radiculitis 02/12/2014   Essential hypertension, benign 01/24/2010   Microcytic anemia 01/11/2010   Hyperlipemia 05/28/2007   Current smoker 05/28/2007   GERD 05/28/2007   Past Medical History:  Diagnosis Date   Anemia    Arthritis    "neck, hips" (04/25/2018)   Claustrophobia    Daily headache    GERD (gastroesophageal reflux disease)    Hiatal hernia    Hyperlipidemia    "hx" (04/25/2018)   Hypertension    Plantar fasciitis    hx - left foot   Stroke (HCC) 05/2014   "mini stroke" (04/25/2018)    Family History  Problem Relation Age of Onset   Pancreatic cancer Mother 101   Heart disease Mother    Esophageal cancer Father  67   Heart disease Sister    Breast cancer Maternal Aunt 60   Colon cancer Maternal Uncle    Pancreatic cancer Maternal Uncle    Colon cancer Paternal Uncle    Rectal cancer Neg Hx    Stomach cancer Neg Hx     Past Surgical History:  Procedure Laterality Date   ABDOMINAL HYSTERECTOMY  12/09   Secondary to fibroids   ARTERY BIOPSY Right 04/10/2018   Procedure:  BIOPSY TEMPORAL ARTERY;  Surgeon: Mayo Speck, MD;  Location: [facility] OR;  Service: Vascular;  Laterality: Right;   CESAREAN SECTION  1986; 1988   COLONOSCOPY     FOOT SURGERY Left 03/2017   Bone Spurs Removed   LUMBAR LAMINECTOMY/DECOMPRESSION MICRODISCECTOMY N/A 04/13/2021   Procedure: LUMBAR FOUR- LUMBAR FIVE AND LUMBAR FIVE-SACRAL ONE  DECOMPRESSION;  Surgeon: Adah Acron, MD;  Location: [facility] OR;  Service: Orthopedics;  Laterality: N/A;   RADIOLOGY WITH ANESTHESIA N/A 04/26/2018   Procedure: MRI WITH ANESTHESIA;  Surgeon: Radiologist, Medication, MD;  Location: [facility] OR;  Service: Radiology;  Laterality: N/A;   RADIOLOGY WITH ANESTHESIA N/A 08/24/2019   Procedure: MRI WITH ANESTHESIA CERVICAL SPINE;  Surgeon: Radiologist, Medication, MD;  Location: [facility] OR;  Service: Radiology;  Laterality: N/A;   RADIOLOGY WITH ANESTHESIA N/A 10/02/2019   Procedure: MRI WITH ANESTHESIA CERVICAL WITHOUT CONTRAST;  Surgeon: Radiologist, Medication, MD;  Location: [facility] OR;  Service: Radiology;  Laterality: N/A;   TUBAL LIGATION  1988   Social History   Occupational History   Not on file  Tobacco Use   Smoking status: Some Days    Current packs/day: 0.25    Average packs/day: 0.3 packs/day for 38.0 years (9.5 ttl pk-yrs)    Types: Cigarettes   Smokeless tobacco: Never   Tobacco comments:    1 cigs/day 08/17/2022 km,cma  Vaping Use   Vaping status: Never Used  Substance and Sexual Activity   Alcohol use: Yes    Alcohol/week: 4.0 standard drinks of alcohol    Types: 4 Cans of beer per week   Drug use: Not Currently   Sexual activity: Not  Currently    Birth control/protection: Surgical    Comment: Hysterectomy

## 2023-10-17 ENCOUNTER — Encounter: Payer: Self-pay | Admitting: Physical Medicine and Rehabilitation

## 2023-10-17 ENCOUNTER — Ambulatory Visit: Admitting: Physical Medicine and Rehabilitation

## 2023-10-17 DIAGNOSIS — M5442 Lumbago with sciatica, left side: Secondary | ICD-10-CM

## 2023-10-17 DIAGNOSIS — G8929 Other chronic pain: Secondary | ICD-10-CM

## 2023-10-17 DIAGNOSIS — M961 Postlaminectomy syndrome, not elsewhere classified: Secondary | ICD-10-CM | POA: Diagnosis not present

## 2023-10-17 DIAGNOSIS — M5416 Radiculopathy, lumbar region: Secondary | ICD-10-CM | POA: Diagnosis not present

## 2023-10-17 MED ORDER — CYCLOBENZAPRINE HCL 10 MG PO TABS: 10.0000 mg | ORAL_TABLET | Freq: Every day | ORAL | 0 refills | Status: DC

## 2023-10-17 MED ORDER — DIAZEPAM 5 MG PO TABS: ORAL_TABLET | ORAL | 0 refills | Status: DC

## 2023-10-17 NOTE — Progress Notes (Signed)
 Pain Scale   Average Pain 10 Patient advising she has been having constant pain in the area the she had back surgery(2017, Dr. Murrel Arnt) [name] advising that she is on her feet all day at work and the pain started x 1 week ago and she is unable to get any relief.        +Driver, -BT, -Dye Allergies.

## 2023-10-17 NOTE — Progress Notes (Signed)
 [name] - 63 y.o. female MRN [id number]  Date of birth: Sep 04, 1960  Office Visit Note: Visit Date: 10/17/2023 PCP: Abram Abraham, NP-C Referred by: Abram Abraham, NP-C  Subjective: Chief Complaint  Patient presents with   Lower Back - Pain   HPI: [name] is a 63 y.o. female who comes in today per the request of Norma Beckers Persons, PA for evaluation of chronic, worsening and severe bilateral lower back pain radiating down left posterior leg to toes. She is former patient of Dr. Tommy Frames. Pain ongoing for several years. Her pain worsens with prolonged sitting and activity. She describes pain as sharp and sore sensation, currently rates as 8 out of 10. Some relief of pain with home exercise regimen, rest and use of medications. History of formal physical therapy in the past with minimal relief of pain. Lumbar MRI imaging from 2022 (prior to surgery) shows moderate canal stenosis at L4-L5 and L5-S1. Moderate foraminal stenosis on the right at L4-L5 and bilaterally at L5-S1. She underwent L4-L5 and L5-S1 decompression with Dr. Murrel Arnt on 04/13/2021. States her pain continued post surgery.  She is currently working as Arboriculturist at [employer]. Patient denies focal weakness, numbness and tingling. No recent trauma or falls.      Review of Systems  Musculoskeletal:  Positive for back pain.  Neurological:  Negative for tingling, sensory change, focal weakness and weakness.  All other systems reviewed and are negative.  Otherwise per HPI.  Assessment & Plan: Visit Diagnoses:    ICD-10-CM   1. Chronic bilateral low back pain with left-sided sciatica  G89.29 MR LUMBAR SPINE WO CONTRAST   M54.42     2. Lumbar radiculopathy  M54.16 MR LUMBAR SPINE WO CONTRAST    3. Post laminectomy syndrome  M96.1 MR LUMBAR SPINE WO CONTRAST       Plan: Findings:  Chronic, worsening and severe bilateral lower back pain radiating down left posterior leg to toes.   Patient continues to have severe pain despite good conservative therapies such as formal physical therapy, home exercise regimen, rest and use of medications.  Patient's clinical presentation and exam are consistent with lumbar radiculopathy, more of an S1 nerve distribution.  Prior lumbar MRI imaging was from 2022 before her surgery.  This imaging did show moderate spinal canal stenosis at the levels of L4-L5 and L5-S1.  We discussed treatment plan in detail today.  Next step is to obtain new lumbar MRI imaging.  She did voice issues with claustrophobia and anxiety.  I prescribed preprocedure Valium  for her to take on day of MRI imaging.  Depending on results of MRI imaging we discussed the possibility of performing lumbar epidural steroid injections.  If we feel her case is more surgical would place referral to our spine surgeon Dr. Colette Davies for further evaluation.  We discussed medication management and I prescribed Flexeril  for her to take at bedtime.  I will see her back for lumbar MRI review and to discuss treatment options.  She can continue to work as tolerated.  No red flag symptoms noted upon exam today.    Meds & Orders:  Meds ordered this encounter  Medications   diazepam  (VALIUM ) 5 MG tablet    Sig: Take one tablet by mouth with food one hour prior to procedure.    Dispense:  1 tablet    Refill:  0   cyclobenzaprine  (FLEXERIL ) 10 MG tablet    Sig: Take 1 tablet (  10 mg total) by mouth at bedtime.    Dispense:  30 tablet    Refill:  0    Orders Placed This Encounter  Procedures   MR LUMBAR SPINE WO CONTRAST    Follow-up: Return for Lumbar MRI review.   Procedures: No procedures performed      Clinical History: No specialty comments available.   She reports that she has been smoking cigarettes. She has a 9.5 pack-year smoking history. She has never used smokeless tobacco. No results for input(s): "HGBA1C", "LABURIC" in the last 8760 hours.  Objective:  VS:  HT:    WT:    BMI:     BP:   HR: bpm  TEMP: ( )  RESP:  Physical Exam Vitals and nursing note reviewed.  HENT:     Head: Normocephalic and atraumatic.     Right Ear: External ear normal.     Left Ear: External ear normal.     Nose: Nose normal.     Mouth/Throat:     Mouth: Mucous membranes are moist.  Eyes:     Extraocular Movements: Extraocular movements intact.  Cardiovascular:     Rate and Rhythm: Normal rate.     Pulses: Normal pulses.  Pulmonary:     Effort: Pulmonary effort is normal.  Abdominal:     General: Abdomen is flat. There is no distension.  Musculoskeletal:        General: Tenderness present.     Cervical back: Normal range of motion.     Comments: Patient rises from seated position to standing without difficulty. Good lumbar range of motion. No pain noted with facet loading. 5/5 strength noted with bilateral hip flexion, knee flexion/extension, ankle dorsiflexion/plantarflexion and EHL. No clonus noted bilaterally. No pain upon palpation of greater trochanters. No pain with internal/external rotation of bilateral hips. Sensation intact bilaterally. Dysesthesias noted to left S1 dermatome. Negative slump test bilaterally. Ambulates without aid, gait steady.     Skin:    General: Skin is warm and dry.     Capillary Refill: Capillary refill takes less than 2 seconds.  Neurological:     General: No focal deficit present.     Mental Status: She is alert and oriented to person, place, and time.  Psychiatric:        Mood and Affect: Mood normal.        Behavior: Behavior normal.     Ortho Exam  Imaging: No results found.  Past Medical/Family/Surgical/Social History: Medications & Allergies reviewed per EMR, new medications updated. Patient Active Problem List   Diagnosis Date Noted   Left foot pain 05/17/2023   Cough 04/25/2023   Hypertriglyceridemia 04/20/2023   Encounter for general adult medical examination with abnormal findings 04/20/2023   Post-COVID chronic  fatigue 05/16/2022   Cough due to ACE inhibitor 05/16/2022   Myalgia 05/16/2022   Acute bronchitis 05/16/2022   Hypomagnesemia 03/19/2022   Vitamin B 12 deficiency 03/19/2022   Low TSH level 03/19/2022   Right elbow pain 03/19/2022   Encounter for screening mammogram for malignant neoplasm of breast 03/19/2022   History of hypertension 12/13/2021   History of smoking 12/13/2021   Elevated blood pressure reading 12/13/2021   Contusion of right knee 12/13/2021   Fall 12/13/2021   Muscle cramps 12/04/2021   Elevated liver enzymes 12/04/2021   Paresthesias 12/04/2021   Elevated LFTs 12/04/2021   Anemia 10/20/2021   Chronic foot pain 10/20/2021   Folic acid  deficiency 09/19/2021   Chronic pain syndrome  09/07/2021   Status post lumbar spine surgery for decompression of spinal cord 05/24/2021   Dyspepsia 05/23/2021   History of gastroesophageal reflux (GERD) 05/23/2021   History of lumbar laminectomy for spinal cord decompression 04/26/2021   Lumbar stenosis 04/13/2021   Tension headache 12/22/2020   Low back pain 10/20/2020   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/28/2020   Hypokalemia 12/17/2019   Chronic diarrhea 09/14/2017   History of stroke 06/12/2014   Cervical radiculitis 02/12/2014   Essential hypertension, benign 01/24/2010   Microcytic anemia 01/11/2010   Hyperlipemia 05/28/2007   Current smoker 05/28/2007   GERD 05/28/2007   Past Medical History:  Diagnosis Date   Anemia    Arthritis    "neck, hips" (04/25/2018)   Claustrophobia    Daily headache    GERD (gastroesophageal reflux disease)    Hiatal hernia    Hyperlipidemia    "hx" (04/25/2018)   Hypertension    Plantar fasciitis    hx - left foot   Stroke (HCC) 05/2014   "mini stroke" (04/25/2018)   Family History  Problem Relation Age of Onset   Pancreatic cancer Mother 48   Heart disease Mother    Esophageal cancer Father 15   Heart disease Sister    Breast cancer Maternal Aunt 60   Colon cancer  Maternal Uncle    Pancreatic cancer Maternal Uncle    Colon cancer Paternal Uncle    Rectal cancer Neg Hx    Stomach cancer Neg Hx    Past Surgical History:  Procedure Laterality Date   ABDOMINAL HYSTERECTOMY  12/09   Secondary to fibroids   ARTERY BIOPSY Right 04/10/2018   Procedure: BIOPSY TEMPORAL ARTERY;  Surgeon: Mayo Speck, MD;  Location: [facility] OR;  Service: Vascular;  Laterality: Right;   CESAREAN SECTION  1986; 1988   COLONOSCOPY     FOOT SURGERY Left 03/2017   Bone Spurs Removed   LUMBAR LAMINECTOMY/DECOMPRESSION MICRODISCECTOMY N/A 04/13/2021   Procedure: LUMBAR FOUR- LUMBAR FIVE AND LUMBAR FIVE-SACRAL ONE  DECOMPRESSION;  Surgeon: Adah Acron, MD;  Location: [facility] OR;  Service: Orthopedics;  Laterality: N/A;   RADIOLOGY WITH ANESTHESIA N/A 04/26/2018   Procedure: MRI WITH ANESTHESIA;  Surgeon: Radiologist, Medication, MD;  Location: [facility] OR;  Service: Radiology;  Laterality: N/A;   RADIOLOGY WITH ANESTHESIA N/A 08/24/2019   Procedure: MRI WITH ANESTHESIA CERVICAL SPINE;  Surgeon: Radiologist, Medication, MD;  Location: [facility] OR;  Service: Radiology;  Laterality: N/A;   RADIOLOGY WITH ANESTHESIA N/A 10/02/2019   Procedure: MRI WITH ANESTHESIA CERVICAL WITHOUT CONTRAST;  Surgeon: Radiologist, Medication, MD;  Location: [facility] OR;  Service: Radiology;  Laterality: N/A;   TUBAL LIGATION  1988   Social History   Occupational History   Not on file  Tobacco Use   Smoking status: Some Days    Current packs/day: 0.25    Average packs/day: 0.3 packs/day for 38.0 years (9.5 ttl pk-yrs)    Types: Cigarettes   Smokeless tobacco: Never   Tobacco comments:    1 cigs/day 08/17/2022 km,cma  Vaping Use   Vaping status: Never Used  Substance and Sexual Activity   Alcohol use: Yes    Alcohol/week: 4.0 standard drinks of alcohol    Types: 4 Cans of beer per week   Drug use: Not Currently   Sexual activity: Not Currently    Birth control/protection: Surgical    Comment: Hysterectomy

## 2023-10-18 ENCOUNTER — Encounter: Payer: Self-pay | Admitting: Physical Medicine and Rehabilitation

## 2023-10-22 ENCOUNTER — Other Ambulatory Visit (HOSPITAL_COMMUNITY): Payer: Self-pay

## 2023-10-27 ENCOUNTER — Other Ambulatory Visit

## 2023-10-29 ENCOUNTER — Other Ambulatory Visit (HOSPITAL_COMMUNITY): Payer: Self-pay

## 2023-10-29 ENCOUNTER — Other Ambulatory Visit: Payer: Self-pay | Admitting: Physical Medicine and Rehabilitation

## 2023-10-29 ENCOUNTER — Telehealth: Payer: Self-pay | Admitting: Physician Assistant

## 2023-10-29 MED ORDER — HYDROCODONE-ACETAMINOPHEN 5-325 MG PO TABS
1.0000 | ORAL_TABLET | Freq: Three times a day (TID) | ORAL | 0 refills | Status: DC | PRN
Start: 2023-10-29 — End: 2023-11-21

## 2023-10-29 NOTE — Telephone Encounter (Signed)
 Patient called said that they change her MRI  but she will need something in the mean time. CB#[phone]

## 2023-10-31 ENCOUNTER — Ambulatory Visit: Admitting: Physical Medicine and Rehabilitation

## 2023-11-01 ENCOUNTER — Other Ambulatory Visit (HOSPITAL_COMMUNITY): Payer: Self-pay

## 2023-11-05 ENCOUNTER — Other Ambulatory Visit (HOSPITAL_COMMUNITY): Payer: Self-pay

## 2023-11-07 ENCOUNTER — Other Ambulatory Visit (HOSPITAL_COMMUNITY): Payer: Self-pay

## 2023-11-10 ENCOUNTER — Ambulatory Visit
Admission: RE | Admit: 2023-11-10 | Discharge: 2023-11-10 | Disposition: A | Discharge status: Home / Self Care | Source: Ambulatory Visit | Attending: Physical Medicine and Rehabilitation | Admitting: Physical Medicine and Rehabilitation

## 2023-11-10 DIAGNOSIS — M5416 Radiculopathy, lumbar region: Secondary | ICD-10-CM

## 2023-11-10 DIAGNOSIS — M961 Postlaminectomy syndrome, not elsewhere classified: Secondary | ICD-10-CM

## 2023-11-10 DIAGNOSIS — G8929 Other chronic pain: Secondary | ICD-10-CM

## 2023-11-20 NOTE — Progress Notes (Signed)
 Surgery orders requested via Epic inbox.

## 2023-11-20 NOTE — Progress Notes (Deleted)
 [facility] OFFICE PROGRESS NOTE  Abram Abraham, NP-C [address] [place] [address]  DIAGNOSIS: Microcytic anemia with history of iron deficiency   PRIOR THERAPY: none  CURRENT THERAPY: Over-the-counter oral iron tablets in addition to folic acid  once daily. She started this on ***  INTERVAL HISTORY: KAVITA BARTL 63 y.o. female returns to the clinic today for a follow-up visit.  The patient is followed for history of microcytic anemia secondary to rectal bleeding from internal hemorrhoids and chronic diarrhea.  She is on creon  and follows with Dr. Elvin Hammer for concerns related to her pancreas. She is schedule with Dr. Camilo Cella from surgery for *** on 11/30/23 condyloma removal of the rectum. She also sees ortho for canal stenosis   Does continue to have rectal bleeding approximately ***x per week she is having diarrhea.  She also was given some type of cream to apply on her hemorrhoids.  The patient has never required any IV iron infusions.  She only just started taking a multivitamin with iron *** She tolerates this well without any GI upset.  Does try and eat greens and some red meat.  She is also compliant with her folic acid . Make sure she is actually compliant with her iron   She has some fatigue. She denies any lightheadedness or dyspnea on exertion. She denies any vaginal bleeding as she is status post a hysterectomy from 2009 due to fibroids.  Her last colonoscopy was in 2021 which showed one 3 mm polyp and small internal hemorrhoids. She had an EGD on 08/18/21 showing normal exam. Denies other bleeding besides the rectal bleeding.  Denies any chills or night sweats, or lymphadenopathy.  She is here for evaluation and repeat blood work.   MEDICAL HISTORY: Past Medical History:  Diagnosis Date   Anemia    Arthritis    "neck, hips" (04/25/2018)   Claustrophobia    Daily headache    GERD (gastroesophageal reflux disease)    Hiatal hernia    Hyperlipidemia     "hx" (04/25/2018)   Hypertension    Plantar fasciitis    hx - left foot   Stroke (HCC) 05/2014   "mini stroke" (04/25/2018)    ALLERGIES:  is allergic to aspirin, hydromorphone hcl, crestor  [rosuvastatin ], and latex.  MEDICATIONS:  Current Outpatient Medications  Medication Sig Dispense Refill   albuterol  (VENTOLIN  HFA) 108 (90 Base) MCG/ACT inhaler Inhale 2 puffs into the lungs every 6 (six) hours as needed for wheezing or shortness of breath. 8 g 0   amLODipine  (NORVASC ) 5 MG tablet Take 1 tablet (5 mg total) by mouth daily. 90 tablet 1   cyclobenzaprine  (FLEXERIL ) 10 MG tablet Take 1 tablet (10 mg total) by mouth at bedtime. 30 tablet 0   diazepam  (VALIUM ) 5 MG tablet Take one tablet by mouth with food one hour prior to procedure. 1 tablet 0   diclofenac  Sodium (VOLTAREN ) 1 % GEL Apply 2 g topically 4 (four) times daily. 200 g 1   diltiazem  2 % GEL Please apply 5 times daily 30 g 1   folic acid  (FOLVITE ) 1 MG tablet Take 1 tablet (1 mg total) by mouth daily. 30 tablet 2   HYDROcodone -acetaminophen  (NORCO/VICODIN) 5-325 MG tablet Take 1 tablet by mouth every 8 (eight) hours as needed for moderate pain (pain score 4-6). 20 tablet 0   lidocaine  (XYLOCAINE ) 5 % ointment APPLY 1 APPLICATION TOPICALLY TWICE DAILY AS NEEDED 36 g 1   lipase/protease/amylase (CREON ) 36000 UNITS  CPEP capsule Take 3 capsules (108,000 Units total) by mouth 3 (three) times daily with meals AND 2 capsules (72,000 Units total) with snacks. 390 capsule 11   meloxicam  (MOBIC ) 15 MG tablet TAKE 1 TABLET BY MOUTH ONCE DAILY AS NEEDED FOR PAIN 30 tablet 0   methylPREDNISolone  (MEDROL  DOSEPAK) 4 MG TBPK tablet Take as directed with food 21 tablet 0   omega-3 acid ethyl esters (LOVAZA ) 1 g capsule Take 2 capsules (2 g total) by mouth 2 (two) times daily. 120 capsule 1   ondansetron  (ZOFRAN -ODT) 8 MG disintegrating tablet Take 1 tablet (8 mg total) by mouth every 8 (eight) hours as needed for nausea or vomiting. 15 tablet 0    pantoprazole  (PROTONIX ) 40 MG tablet Take 1 tablet (40 mg total) by mouth 2 (two) times daily. Take 30 minutes before breakfast. 60 tablet 2   potassium chloride  (KLOR-CON  M) 10 MEQ tablet Take 1 tablet (10 mEq total) by mouth daily. 30 tablet 2   prochlorperazine  (COMPAZINE ) 10 MG tablet Take 1 tablet (10 mg total) by mouth 2 (two) times daily as needed for nausea or vomiting. 10 tablet 0   No current facility-administered medications for this visit.    SURGICAL HISTORY:  Past Surgical History:  Procedure Laterality Date   ABDOMINAL HYSTERECTOMY  12/09   Secondary to fibroids   ARTERY BIOPSY Right 04/10/2018   Procedure: BIOPSY TEMPORAL ARTERY;  Surgeon: Mayo Speck, MD;  Location: [facility] OR;  Service: Vascular;  Laterality: Right;   CESAREAN SECTION  1986; 1988   COLONOSCOPY     FOOT SURGERY Left 03/2017   Bone Spurs Removed   LUMBAR LAMINECTOMY/DECOMPRESSION MICRODISCECTOMY N/A 04/13/2021   Procedure: LUMBAR FOUR- LUMBAR FIVE AND LUMBAR FIVE-SACRAL ONE  DECOMPRESSION;  Surgeon: Adah Acron, MD;  Location: [facility] OR;  Service: Orthopedics;  Laterality: N/A;   RADIOLOGY WITH ANESTHESIA N/A 04/26/2018   Procedure: MRI WITH ANESTHESIA;  Surgeon: Radiologist, Medication, MD;  Location: [facility] OR;  Service: Radiology;  Laterality: N/A;   RADIOLOGY WITH ANESTHESIA N/A 08/24/2019   Procedure: MRI WITH ANESTHESIA CERVICAL SPINE;  Surgeon: Radiologist, Medication, MD;  Location: [facility] OR;  Service: Radiology;  Laterality: N/A;   RADIOLOGY WITH ANESTHESIA N/A 10/02/2019   Procedure: MRI WITH ANESTHESIA CERVICAL WITHOUT CONTRAST;  Surgeon: Radiologist, Medication, MD;  Location: [facility] OR;  Service: Radiology;  Laterality: N/A;   TUBAL LIGATION  1988    REVIEW OF SYSTEMS:   Review of Systems  Constitutional: Negative for appetite change, chills, fatigue, fever and unexpected weight change.  HENT:   Negative for mouth sores, nosebleeds, sore throat and trouble swallowing.   Eyes: Negative for eye problems and  icterus.  Respiratory: Negative for cough, hemoptysis, shortness of breath and wheezing.   Cardiovascular: Negative for chest pain and leg swelling.  Gastrointestinal: Negative for abdominal pain, constipation, diarrhea, nausea and vomiting.  Genitourinary: Negative for bladder incontinence, difficulty urinating, dysuria, frequency and hematuria.   Musculoskeletal: Negative for back pain, gait problem, neck pain and neck stiffness.  Skin: Negative for itching and rash.  Neurological: Negative for dizziness, extremity weakness, gait problem, headaches, light-headedness and seizures.  Hematological: Negative for adenopathy. Does not bruise/bleed easily.  Psychiatric/Behavioral: Negative for confusion, depression and sleep disturbance. The patient is not nervous/anxious.     PHYSICAL EXAMINATION:  There were no vitals taken for this visit.  ECOG PERFORMANCE STATUS: {CHL ONC ECOG [id number]  Physical Exam  Constitutional: Oriented to person, place, and time and well-developed, well-nourished, and in no  distress. No distress.  HENT:  Head: Normocephalic and atraumatic.  Mouth/Throat: Oropharynx is clear and moist. No oropharyngeal exudate.  Eyes: Conjunctivae are normal. Right eye exhibits no discharge. Left eye exhibits no discharge. No scleral icterus.  Neck: Normal range of motion. Neck supple.  Cardiovascular: Normal rate, regular rhythm, normal heart sounds and intact distal pulses.   Pulmonary/Chest: Effort normal and breath sounds normal. No respiratory distress. No wheezes. No rales.  Abdominal: Soft. Bowel sounds are normal. Exhibits no distension and no mass. There is no tenderness.  Musculoskeletal: Normal range of motion. Exhibits no edema.  Lymphadenopathy:    No cervical adenopathy.  Neurological: Alert and oriented to person, place, and time. Exhibits normal muscle tone. Gait normal. Coordination normal.  Skin: Skin is warm and dry. No rash noted. Not diaphoretic. No  erythema. No pallor.  Psychiatric: Mood, memory and judgment normal.  Vitals reviewed.  LABORATORY DATA: Lab Results  Component Value Date   WBC 7.3 08/09/2023   HGB 11.6 (L) 08/09/2023   HCT 37.0 08/09/2023   MCV 74.0 (L) 08/09/2023   PLT 308 08/09/2023      Chemistry      Component Value Date/Time   NA 144 08/09/2023 1217   NA 143 02/18/2020 1429   K 3.0 (L) 08/09/2023 1217   CL 107 08/09/2023 1217   CO2 23 08/09/2023 1217   BUN 13 08/09/2023 1217   BUN 13 02/18/2020 1429   CREATININE 0.71 08/09/2023 1217   CREATININE 0.88 03/19/2023 0731   CREATININE 0.60 04/23/2014 0910      Component Value Date/Time   CALCIUM  9.2 08/09/2023 1217   ALKPHOS 67 08/09/2023 1217   AST 41 08/09/2023 1217   AST 28 03/19/2023 0731   ALT 65 (H) 08/09/2023 1217   ALT 44 03/19/2023 0731   BILITOT 0.8 08/09/2023 1217   BILITOT 0.3 03/19/2023 0731       RADIOGRAPHIC STUDIES:  No results found.   ASSESSMENT/PLAN:  This is a very pleasant 63 year old African-American female with history of microcytic anemia of unclear etiology. The patient had extensive work-up performed in the past including iron study and ferritin that were unremarkable. She also had hemoglobin electrophoresis that showed no evidence for sickle cell anemia or sickle cell trait. Her heavy metal testing was negative. The only abnormality was found on her previous blood work was low serum folate and she is currently on folic acid  and tolerating it fairly well.    She just recently resumed taking a multivitamin with iron. She also takes folic acid .    Repeat CBC showed stable persistent anemia with hemoglobin of *** and hematocrit and low MCV of ***.    I am waiting for the results of her iron studies. I will call her tomorrow with the results if she needs an iron infusion. For now, she was advised to continue on a dedicated oral iron supplement.     The patient was advised to call immediately if she has any concerning  symptoms in the interval. The patient voices understanding of current disease status and treatment options and is in agreement with the current care plan. All questions were answered. The patient knows to call the clinic with any problems, questions or concerns. We can certainly see the patient much sooner if necessary    No orders of the defined types were placed in this encounter.    I spent {CHL ONC TIME VISIT - [id number]} counseling the patient face to face. The total  time spent in the appointment was {CHL ONC TIME VISIT - ZOXWR:[id number]}.  Elisandra Deshmukh L Johnda Billiot, PA-C 11/20/23

## 2023-11-21 ENCOUNTER — Telehealth: Payer: Self-pay | Admitting: Physical Medicine and Rehabilitation

## 2023-11-21 ENCOUNTER — Other Ambulatory Visit: Payer: Self-pay | Admitting: Physical Medicine and Rehabilitation

## 2023-11-21 MED ORDER — HYDROCODONE-ACETAMINOPHEN 5-325 MG PO TABS: 1.0000 | ORAL_TABLET | Freq: Three times a day (TID) | ORAL | 0 refills | Status: DC | PRN

## 2023-11-21 NOTE — Telephone Encounter (Signed)
 Patient called. Would like some pain medication called in for her.

## 2023-11-22 ENCOUNTER — Ambulatory Visit: Payer: Self-pay | Admitting: Surgery

## 2023-11-22 DIAGNOSIS — Z01818 Encounter for other preprocedural examination: Secondary | ICD-10-CM

## 2023-11-23 ENCOUNTER — Inpatient Hospital Stay: Payer: 59 | Admitting: Physician Assistant

## 2023-11-23 ENCOUNTER — Inpatient Hospital Stay: Payer: 59 | Attending: Physician Assistant

## 2023-11-26 NOTE — Progress Notes (Addendum)
 COVID Vaccine Completed:yes  Date of COVID positive in last 90 days:  PCP - Alyson Back, NP Cardiologist - n/a  Chest x-ray - n/a EKG - 11/27/23 Epic/chart Stress Test - 07/11/13 Epic ECHO - n/a Cardiac Cath - n/a Pacemaker/ICD device last checked: n/a Spinal Cord Stimulator: n/a  Bowel Prep - Miralax, antibiotics, dulcolax and clears day before. Patient unaware, instructed to call Dr. Maurie Southern office  Sleep Study - n/a CPAP -   Fasting Blood Sugar - n/a Checks Blood Sugar _____ times a day  Last dose of GLP1 agonist-  N/A GLP1 instructions:  Hold 7 days before surgery    Last dose of SGLT-2 inhibitors-  N/A SGLT-2 instructions:  Hold 3 days before surgery    Blood Thinner Instructions:  Last dose: n/a Time: Aspirin Instructions: Last Dose:  Activity level: Can go up a flight of stairs and perform activities of daily living without stopping and without symptoms of chest pain or shortness of breath. Difficulty with ambulation occasionally due to back pain   Anesthesia review:   Patient denies shortness of breath, fever, cough and chest pain at PAT appointment  Patient verbalized understanding of instructions that were given to them at the PAT appointment. Patient was also instructed that they will need to review over the PAT instructions again at home before surgery.

## 2023-11-27 ENCOUNTER — Other Ambulatory Visit: Payer: Self-pay

## 2023-11-27 ENCOUNTER — Encounter (HOSPITAL_COMMUNITY)
Admission: RE | Admit: 2023-11-27 | Discharge: 2023-11-27 | Disposition: A | Discharge status: Home / Self Care | Source: Ambulatory Visit | Attending: Surgery | Admitting: Surgery

## 2023-11-27 ENCOUNTER — Encounter (HOSPITAL_COMMUNITY): Payer: Self-pay

## 2023-11-27 VITALS — BP 136/88 | HR 90 | Temp 98.4°F | Resp 16 | Ht 59.0 in | Wt 136.0 lb

## 2023-11-27 DIAGNOSIS — Z8679 Personal history of other diseases of the circulatory system: Secondary | ICD-10-CM | POA: Diagnosis not present

## 2023-11-27 DIAGNOSIS — Z01818 Encounter for other preprocedural examination: Secondary | ICD-10-CM | POA: Insufficient documentation

## 2023-11-27 HISTORY — DX: Pneumonia, unspecified organism: J18.9

## 2023-11-27 LAB — BASIC METABOLIC PANEL WITH GFR
Anion gap: 11 (ref 5–15)
BUN: 14 mg/dL (ref 8–23)
CO2: 24 mmol/L (ref 22–32)
Calcium: 9.3 mg/dL (ref 8.9–10.3)
Chloride: 104 mmol/L (ref 98–111)
Creatinine, Ser: 0.68 mg/dL (ref 0.44–1.00)
GFR, Estimated: >60 mL/min (ref >60)
Glucose, Bld: 104 mg/dL — ABNORMAL HIGH (ref 70–99)
Potassium: 3.6 mmol/L (ref 3.5–5.1)
Sodium: 139 mmol/L (ref 135–145)

## 2023-11-27 LAB — CBC WITH DIFFERENTIAL/PLATELET
Abs Immature Granulocytes: 0.01 10*3/uL (ref 0.00–0.07)
Basophils Absolute: 0 10*3/uL (ref 0.0–0.1)
Basophils Relative: 0 %
Eosinophils Absolute: 0.2 10*3/uL (ref 0.0–0.5)
Eosinophils Relative: 3 %
HCT: 34.4 % — ABNORMAL LOW (ref 36.0–46.0)
Hemoglobin: 10.5 g/dL — ABNORMAL LOW (ref 12.0–15.0)
Immature Granulocytes: 0 %
Lymphocytes Relative: 47 %
Lymphs Abs: 2.9 10*3/uL (ref 0.7–4.0)
MCH: 23.1 pg — ABNORMAL LOW (ref 26.0–34.0)
MCHC: 30.5 g/dL (ref 30.0–36.0)
MCV: 75.8 fL — ABNORMAL LOW (ref 80.0–100.0)
Monocytes Absolute: 0.5 10*3/uL (ref 0.1–1.0)
Monocytes Relative: 8 %
Neutro Abs: 2.7 10*3/uL (ref 1.7–7.7)
Neutrophils Relative %: 42 %
Platelets: 290 10*3/uL (ref 150–400)
RBC: 4.54 MIL/uL (ref 3.87–5.11)
RDW: 14.8 % (ref 11.5–15.5)
WBC: 6.3 10*3/uL (ref 4.0–10.5)
nRBC: 0 % (ref 0.0–0.2)

## 2023-11-27 NOTE — Patient Instructions (Addendum)
 SURGICAL WAITING ROOM VISITATION  Patients having surgery or a procedure may have no more than 2 support people in the waiting area - these visitors may rotate.    Children under the age of 24 must have an adult with them who is not the patient.  Due to an increase in RSV and influenza rates and associated hospitalizations, children ages 70 and under may not visit patients in [facility] hospitals.  Visitors with respiratory illnesses are discouraged from visiting and should remain at home.  If the patient needs to stay at the hospital during part of their recovery, the visitor guidelines for inpatient rooms apply. Pre-op nurse will coordinate an appropriate time for 1 support person to accompany patient in pre-op.  This support person may not rotate.    Please refer to the [facility] website for the visitor guidelines for Inpatients (after your surgery is over and you are in a regular room).    Your procedure is scheduled on: 11/30/23   Report to [facility] Main Entrance    Report to admitting at 8:45 AM   Call this number if you have problems the morning of surgery [phone]   Follow a clear liquid diet the day before surgery.  Water Non-Citrus Juices (without pulp, NO RED-Apple, White grape, White cranberry) Black Coffee (NO MILK/CREAM OR CREAMERS, sugar ok)  Clear Tea (NO MILK/CREAM OR CREAMERS, sugar ok) regular and decaf                             Plain Jell-O (NO RED)                                           Fruit ices (not with fruit pulp, NO RED)                                     Popsicles (NO RED)                                                               Sports drinks like Gatorade (NO RED)                Do not drink liquids :After Midnight.          If you have questions, please contact your surgeon's office.   FOLLOW BOWEL PREP AND ANY ADDITIONAL PRE OP INSTRUCTIONS YOU RECEIVED FROM YOUR SURGEON'S OFFICE!!!     Oral Hygiene is also important  to reduce your risk of infection.                                    Remember - BRUSH YOUR TEETH THE MORNING OF SURGERY WITH YOUR REGULAR TOOTHPASTE  DENTURES WILL BE REMOVED PRIOR TO SURGERY PLEASE DO NOT APPLY "Poly grip" OR ADHESIVES!!!   Do NOT smoke after Midnight   Stop all vitamins and herbal supplements 7 days before surgery.   Take these medicines the morning of surgery with A SIP  OF WATER: Inhalers, Amlodipine , Hydrocodone , Zofran , Pantoprazole                               You may not have any metal on your body including hair pins, jewelry, and body piercing             Do not wear make-up, lotions, powders, perfumes, or deodorant  Do not wear nail polish including gel and S&S, artificial/acrylic nails, or any other type of covering on natural nails including finger and toenails. If you have artificial nails, gel coating, etc. that needs to be removed by a nail salon please have this removed prior to surgery or surgery may need to be canceled/ delayed if the surgeon/ anesthesia feels like they are unable to be safely monitored.   Do not shave  48 hours prior to surgery.    Do not bring valuables to the hospital. [place] IS NOT             RESPONSIBLE   FOR VALUABLES.   Contacts, glasses, dentures or bridgework may not be worn into surgery.  DO NOT BRING YOUR HOME MEDICATIONS TO THE HOSPITAL. PHARMACY WILL DISPENSE MEDICATIONS LISTED ON YOUR MEDICATION LIST TO YOU DURING YOUR ADMISSION IN THE HOSPITAL!    Patients discharged on the day of surgery will not be allowed to drive home.  Someone NEEDS to stay with you for the first 24 hours after anesthesia.   Special Instructions: Bring a copy of your healthcare power of attorney and living will documents the day of surgery if you haven't scanned them before.              Please read over the following fact sheets you were given: IF YOU HAVE QUESTIONS ABOUT YOUR PRE-OP INSTRUCTIONS PLEASE CALL [name]    If you  received a COVID test during your pre-op visit  it is requested that you wear a mask when out in public, stay away from anyone that may not be feeling well and notify your surgeon if you develop symptoms. If you test positive for Covid or have been in contact with anyone that has tested positive in the last 10 days please notify you surgeon.    [facility] - Preparing for Surgery Before surgery, you can play an important role.  Because skin is not sterile, your skin needs to be as free of germs as possible.  You can reduce the number of germs on your skin by washing with CHG (chlorahexidine gluconate) soap before surgery.  CHG is an antiseptic cleaner which kills germs and bonds with the skin to continue killing germs even after washing. Please DO NOT use if you have an allergy to CHG or antibacterial soaps.  If your skin becomes reddened/irritated stop using the CHG and inform your nurse when you arrive at Short Stay. Do not shave (including legs and underarms) for at least 48 hours prior to the first CHG shower.  You may shave your face/neck.  Please follow these instructions carefully:  1.  Shower with CHG Soap the night before surgery and the  morning of surgery.  2.  If you choose to wash your hair, wash your hair first as usual with your normal  shampoo.  3.  After you shampoo, rinse your hair and body thoroughly to remove the shampoo.  4.  Use CHG as you would any other liquid soap.  You can apply chg directly to the skin and wash.  Gently with a scrungie or clean washcloth.  5.  Apply the CHG Soap to your body ONLY FROM THE NECK DOWN.   Do   not use on face/ open                           Wound or open sores. Avoid contact with eyes, ears mouth and   genitals (private parts).                       Wash face,  Genitals (private parts) with your normal soap.             6.  Wash thoroughly, paying special attention to the area where your    surgery  will be  performed.  7.  Thoroughly rinse your body with warm water from the neck down.  8.  DO NOT shower/wash with your normal soap after using and rinsing off the CHG Soap.                9.  Pat yourself dry with a clean towel.            10.  Wear clean pajamas.            11.  Place clean sheets on your bed the night of your first shower and do not  sleep with pets. Day of Surgery : Do not apply any lotions/deodorants the morning of surgery.  Please wear clean clothes to the hospital/surgery center.  FAILURE TO FOLLOW THESE INSTRUCTIONS MAY RESULT IN THE CANCELLATION OF YOUR SURGERY  PATIENT SIGNATURE_________________________________  NURSE SIGNATURE__________________________________  ________________________________________________________________________

## 2023-11-30 ENCOUNTER — Encounter (HOSPITAL_COMMUNITY): Payer: Self-pay | Admitting: Surgery

## 2023-11-30 ENCOUNTER — Other Ambulatory Visit: Payer: Self-pay

## 2023-11-30 ENCOUNTER — Other Ambulatory Visit (HOSPITAL_COMMUNITY): Payer: Self-pay

## 2023-11-30 ENCOUNTER — Encounter (HOSPITAL_COMMUNITY)
Admission: RE | Disposition: A | Discharge status: Home / Self Care | Payer: Self-pay | Source: Home / Self Care | Attending: Surgery

## 2023-11-30 ENCOUNTER — Ambulatory Visit (HOSPITAL_COMMUNITY)
Admission: RE | Admit: 2023-11-30 | Discharge: 2023-11-30 | Disposition: A | Discharge status: Home / Self Care | Attending: Surgery | Admitting: Surgery

## 2023-11-30 ENCOUNTER — Ambulatory Visit (HOSPITAL_COMMUNITY): Admitting: Certified Registered"

## 2023-11-30 DIAGNOSIS — Z8673 Personal history of transient ischemic attack (TIA), and cerebral infarction without residual deficits: Secondary | ICD-10-CM | POA: Insufficient documentation

## 2023-11-30 DIAGNOSIS — K219 Gastro-esophageal reflux disease without esophagitis: Secondary | ICD-10-CM | POA: Diagnosis not present

## 2023-11-30 DIAGNOSIS — K625 Hemorrhage of anus and rectum: Secondary | ICD-10-CM | POA: Insufficient documentation

## 2023-11-30 DIAGNOSIS — F17210 Nicotine dependence, cigarettes, uncomplicated: Secondary | ICD-10-CM | POA: Diagnosis not present

## 2023-11-30 DIAGNOSIS — M1990 Unspecified osteoarthritis, unspecified site: Secondary | ICD-10-CM | POA: Diagnosis not present

## 2023-11-30 DIAGNOSIS — K648 Other hemorrhoids: Secondary | ICD-10-CM | POA: Insufficient documentation

## 2023-11-30 DIAGNOSIS — K449 Diaphragmatic hernia without obstruction or gangrene: Secondary | ICD-10-CM | POA: Insufficient documentation

## 2023-11-30 DIAGNOSIS — K601 Chronic anal fissure: Secondary | ICD-10-CM | POA: Insufficient documentation

## 2023-11-30 DIAGNOSIS — I10 Essential (primary) hypertension: Secondary | ICD-10-CM | POA: Insufficient documentation

## 2023-11-30 DIAGNOSIS — K6289 Other specified diseases of anus and rectum: Secondary | ICD-10-CM | POA: Diagnosis present

## 2023-11-30 DIAGNOSIS — K644 Residual hemorrhoidal skin tags: Secondary | ICD-10-CM | POA: Insufficient documentation

## 2023-11-30 DIAGNOSIS — R519 Headache, unspecified: Secondary | ICD-10-CM | POA: Insufficient documentation

## 2023-11-30 DIAGNOSIS — F419 Anxiety disorder, unspecified: Secondary | ICD-10-CM | POA: Diagnosis not present

## 2023-11-30 DIAGNOSIS — D649 Anemia, unspecified: Secondary | ICD-10-CM | POA: Insufficient documentation

## 2023-11-30 HISTORY — PX: CONDYLOMA EXCISION/FULGURATION: SHX1389

## 2023-11-30 HISTORY — PX: RECTAL EXAM UNDER ANESTHESIA: SHX6399

## 2023-11-30 HISTORY — PX: INJECTION, CHEMODENERVATION AGENT: SHX7597

## 2023-11-30 SURGERY — INJECTION, CHEMODENERVATION AGENT
Anesthesia: Monitor Anesthesia Care

## 2023-11-30 MED ORDER — FENTANYL CITRATE (PF) 250 MCG/5ML IJ SOLN
INTRAMUSCULAR | Status: AC
  Filled 2023-11-30: qty 5

## 2023-11-30 MED ORDER — CHLORHEXIDINE GLUCONATE CLOTH 2 % EX PADS: 6.0000 | MEDICATED_PAD | Freq: Once | CUTANEOUS | Status: DC

## 2023-11-30 MED ORDER — TRAMADOL HCL 50 MG PO TABS
50.0000 mg | ORAL_TABLET | Freq: Four times a day (QID) | ORAL | 0 refills | Status: AC | PRN
  Filled 2023-11-30: qty 10, 3d supply, fill #0

## 2023-11-30 MED ORDER — HEMOSTATIC AGENTS (NO CHARGE) OPTIME
TOPICAL | Status: DC | PRN
  Administered 2023-11-30: 1

## 2023-11-30 MED ORDER — FENTANYL CITRATE (PF) 250 MCG/5ML IJ SOLN
INTRAMUSCULAR | Status: DC | PRN
  Administered 2023-11-30: 100 ug via INTRAVENOUS

## 2023-11-30 MED ORDER — LACTATED RINGERS IV SOLN: INTRAVENOUS | Status: DC

## 2023-11-30 MED ORDER — BUPIVACAINE LIPOSOME 1.3 % IJ SUSP: 20.0000 mL | Freq: Once | INTRAMUSCULAR | Status: DC

## 2023-11-30 MED ORDER — ONABOTULINUMTOXINA 100 UNITS IJ SOLR
INTRAMUSCULAR | Status: AC
  Filled 2023-11-30: qty 100

## 2023-11-30 MED ORDER — SCOPOLAMINE 1 MG/3DAYS TD PT72
MEDICATED_PATCH | TRANSDERMAL | Status: AC
  Filled 2023-11-30: qty 1

## 2023-11-30 MED ORDER — BUPIVACAINE-EPINEPHRINE (PF) 0.25% -1:200000 IJ SOLN
INTRAMUSCULAR | Status: AC
  Filled 2023-11-30: qty 30

## 2023-11-30 MED ORDER — FENTANYL CITRATE PF 50 MCG/ML IJ SOSY
25.0000 ug | PREFILLED_SYRINGE | INTRAMUSCULAR | Status: DC | PRN
  Administered 2023-11-30 (×2): 50 ug via INTRAVENOUS

## 2023-11-30 MED ORDER — MIDAZOLAM HCL 2 MG/2ML IJ SOLN
INTRAMUSCULAR | Status: AC
  Filled 2023-11-30: qty 2

## 2023-11-30 MED ORDER — ONABOTULINUMTOXINA 100 UNITS IJ SOLR
INTRAMUSCULAR | Status: DC | PRN
  Administered 2023-11-30: 100 [IU] via INTRAMUSCULAR

## 2023-11-30 MED ORDER — FENTANYL CITRATE PF 50 MCG/ML IJ SOSY
PREFILLED_SYRINGE | INTRAMUSCULAR | Status: AC
  Filled 2023-11-30: qty 2

## 2023-11-30 MED ORDER — PHENYLEPHRINE 80 MCG/ML (10ML) SYRINGE FOR IV PUSH (FOR BLOOD PRESSURE SUPPORT)
PREFILLED_SYRINGE | INTRAVENOUS | Status: DC | PRN
  Administered 2023-11-30: 160 ug via INTRAVENOUS

## 2023-11-30 MED ORDER — BUPIVACAINE-EPINEPHRINE 0.25% -1:200000 IJ SOLN
INTRAMUSCULAR | Status: DC | PRN
  Administered 2023-11-30: 43 mL

## 2023-11-30 MED ORDER — DROPERIDOL 2.5 MG/ML IJ SOLN: 0.6250 mg | Freq: Once | INTRAMUSCULAR | Status: DC | PRN

## 2023-11-30 MED ORDER — PROPOFOL 10 MG/ML IV BOLUS
INTRAVENOUS | Status: DC | PRN
Start: 2023-11-30 — End: 2023-11-30
  Administered 2023-11-30 (×7): 20 mg via INTRAVENOUS

## 2023-11-30 MED ORDER — PROPOFOL 10 MG/ML IV BOLUS
INTRAVENOUS | Status: AC
  Filled 2023-11-30: qty 20

## 2023-11-30 MED ORDER — BUPIVACAINE LIPOSOME 1.3 % IJ SUSP
INTRAMUSCULAR | Status: AC
  Filled 2023-11-30: qty 20

## 2023-11-30 MED ORDER — ORAL CARE MOUTH RINSE: 15.0000 mL | Freq: Once | OROMUCOSAL | Status: AC

## 2023-11-30 MED ORDER — CHLORHEXIDINE GLUCONATE 0.12 % MT SOLN
15.0000 mL | Freq: Once | OROMUCOSAL | Status: AC
  Administered 2023-11-30: 15 mL via OROMUCOSAL

## 2023-11-30 MED ORDER — SODIUM CHLORIDE (PF) 0.9 % IJ SOLN
INTRAMUSCULAR | Status: AC
  Filled 2023-11-30: qty 10

## 2023-11-30 MED ORDER — ACETAMINOPHEN 500 MG PO TABS
1000.0000 mg | ORAL_TABLET | ORAL | Status: AC
  Administered 2023-11-30: 1000 mg via ORAL
  Filled 2023-11-30: qty 2

## 2023-11-30 MED ORDER — 0.9 % SODIUM CHLORIDE (POUR BTL) OPTIME
TOPICAL | Status: DC | PRN
  Administered 2023-11-30: 1000 mL

## 2023-11-30 MED ORDER — PROPOFOL 500 MG/50ML IV EMUL
INTRAVENOUS | Status: DC | PRN
  Administered 2023-11-30: 50 ug/kg/min via INTRAVENOUS

## 2023-11-30 MED ORDER — MIDAZOLAM HCL 2 MG/2ML IJ SOLN
INTRAMUSCULAR | Status: DC | PRN
Start: 2023-11-30 — End: 2023-11-30
  Administered 2023-11-30: 2 mg via INTRAVENOUS

## 2023-11-30 SURGICAL SUPPLY — 26 items
BAG COUNTER SPONGE SURGICOUNT (BAG) IMPLANT
BRIEF MESH DISP LRG (UNDERPADS AND DIAPERS) ×1 IMPLANT
ELECT NDL BLADE 2-5/6 (NEEDLE) ×1 IMPLANT
ELECT NEEDLE BLADE 2-5/6 (NEEDLE) ×1 IMPLANT
ELECT REM PT RETURN 15FT ADLT (MISCELLANEOUS) ×1 IMPLANT
GAUZE PAD ABD 8X10 STRL (GAUZE/BANDAGES/DRESSINGS) IMPLANT
GAUZE SPONGE 4X4 12PLY STRL (GAUZE/BANDAGES/DRESSINGS) IMPLANT
GLOVE BIO SURGEON STRL SZ7.5 (GLOVE) ×1 IMPLANT
GLOVE INDICATOR 8.0 STRL GRN (GLOVE) ×1 IMPLANT
GOWN STRL REUS W/ TWL XL LVL3 (GOWN DISPOSABLE) ×2 IMPLANT
KIT BASIN OR (CUSTOM PROCEDURE TRAY) ×1 IMPLANT
KIT TURNOVER KIT A (KITS) IMPLANT
NDL HYPO 22X1.5 SAFETY MO (MISCELLANEOUS) ×1 IMPLANT
NEEDLE HYPO 22X1.5 SAFETY MO (MISCELLANEOUS) ×1 IMPLANT
PACK GENERAL/GYN (CUSTOM PROCEDURE TRAY) ×1 IMPLANT
SHEARS HARMONIC 9CM CVD (BLADE) IMPLANT
SPIKE FLUID TRANSFER (MISCELLANEOUS) ×1 IMPLANT
SPONGE HEMORRHOID 8X3CM (HEMOSTASIS) IMPLANT
SPONGE SURGIFOAM ABS GEL 100 (HEMOSTASIS) IMPLANT
SURGILUBE 2OZ TUBE FLIPTOP (MISCELLANEOUS) ×1 IMPLANT
SUT CHROMIC 2 0 SH (SUTURE) IMPLANT
SUT CHROMIC 3 0 SH 27 (SUTURE) IMPLANT
SUT MNCRL AB 4-0 PS2 18 (SUTURE) ×1 IMPLANT
SUT VIC AB 3-0 SH 27X BRD (SUTURE) ×1 IMPLANT
SYR 20ML LL LF (SYRINGE) ×1 IMPLANT
TOWEL OR 17X26 10 PK STRL BLUE (TOWEL DISPOSABLE) ×1 IMPLANT

## 2023-11-30 NOTE — H&P (Signed)
 CC: Here today for surgery  HPI: [name] is an 63 y.o. female with history of HTN, GERD, whom is seen in the office today as a referral by [facility] for evaluation of anorectal pain, hypertrophic anal papilla.   Last colonoscopy 11/11/2019 with Dr. Elvin Hammer: - Examined portion of ileum normal - 3 mm polyp removed from transverse (sessile serrated polyp) - Small hemorrhoids. Prominent hypertrophic anal papilla - Exam otherwise normal - Plan repeat colonoscopy in 7 to 10 years.  She reports a history of prolapse of a small piece of tissue from within the anal canal externally with every bowel movement. She also notes sharp anorectal pain with bowel movements. The pain is described as knifelike sensation. She has between 2 and 4 bowel movements per day that are variable in consistency. She will occasionally have some bright red blood per rectum. She is following with gastroenterology. She takes a lot of dietary fiber including a particular bread type with a lot of fiber in it. She denies any straining with bowel movements. She drinks 6 to 8 glasses of water per day. She will often times have to reduce the small piece of tissue prolapse when having bowel movements and will wear a glove to the bathroom for this purpose.  She has applied topical diltiazem  which she obtained from a compounding pharmacy 3 times per day for 2 months and had no real change in her symptoms. She was subsequently changed to a topical lidocaine  ointment that she has been applying it is also not been seeming to help much.  PMH: HTN, GERD  PSH: Denies any prior anorectal surgeries or procedures.  FHx: Denies any known family history of colorectal, breast, endometrial or ovarian cancer  Social Hx: Denies use of tobacco/EtOH/illicit drug. She smokes approximately 1 cigarette/day, working to quit. She works as a Arboriculturist at [employer].   She denies any changes in health or health history since we met in the  office. No new medications/allergies. She states she is ready for surgery today.  Past Medical History:  Diagnosis Date   Anemia    Arthritis    "neck, hips" (04/25/2018)   Claustrophobia    Daily headache    no longer a problem   GERD (gastroesophageal reflux disease)    Hiatal hernia    Hyperlipidemia    "hx" (04/25/2018)   Hypertension    Plantar fasciitis    hx - left foot   Pneumonia    Stroke (HCC) 05/2014   "mini stroke" (04/25/2018)    Past Surgical History:  Procedure Laterality Date   ABDOMINAL HYSTERECTOMY  12/09   Secondary to fibroids   ARTERY BIOPSY Right 04/10/2018   Procedure: BIOPSY TEMPORAL ARTERY;  Surgeon: Mayo Speck, MD;  Location: [facility] OR;  Service: Vascular;  Laterality: Right;   CESAREAN SECTION  1986; 1988   COLONOSCOPY     FOOT SURGERY Left 03/2017   Bone Spurs Removed   LUMBAR LAMINECTOMY/DECOMPRESSION MICRODISCECTOMY N/A 04/13/2021   Procedure: LUMBAR FOUR- LUMBAR FIVE AND LUMBAR FIVE-SACRAL ONE  DECOMPRESSION;  Surgeon: Adah Acron, MD;  Location: [facility] OR;  Service: Orthopedics;  Laterality: N/A;   RADIOLOGY WITH ANESTHESIA N/A 04/26/2018   Procedure: MRI WITH ANESTHESIA;  Surgeon: Radiologist, Medication, MD;  Location: [facility] OR;  Service: Radiology;  Laterality: N/A;   RADIOLOGY WITH ANESTHESIA N/A 08/24/2019   Procedure: MRI WITH ANESTHESIA CERVICAL SPINE;  Surgeon: Radiologist, Medication, MD;  Location: [facility] OR;  Service: Radiology;  Laterality: N/A;  RADIOLOGY WITH ANESTHESIA N/A 10/02/2019   Procedure: MRI WITH ANESTHESIA CERVICAL WITHOUT CONTRAST;  Surgeon: Radiologist, Medication, MD;  Location: [facility] OR;  Service: Radiology;  Laterality: N/A;   TUBAL LIGATION  1988    Family History  Problem Relation Age of Onset   Pancreatic cancer Mother 11   Heart disease Mother    Esophageal cancer Father 18   Heart disease Sister    Breast cancer Maternal Aunt 60   Colon cancer Maternal Uncle    Pancreatic cancer Maternal Uncle    Colon cancer  Paternal Uncle    Rectal cancer Neg Hx    Stomach cancer Neg Hx     Social:  reports that she has been smoking cigarettes. She has a 9.5 pack-year smoking history. She has never used smokeless tobacco. She reports current alcohol use of about 4.0 standard drinks of alcohol per week. She reports that she does not currently use drugs.  Allergies:  Allergies  Allergen Reactions   Aspirin Hives   Hydromorphone Hcl Hives and Nausea And Vomiting    Can take Norco   Crestor  [Rosuvastatin ]     myalgias   Latex Rash    Medications: I have reviewed the patient's current medications.  No results found. However, due to the size of the patient record, not all encounters were searched. Please check Results Review for a complete set of results.  No results found.   PE Blood pressure (!) 158/92, pulse 70, temperature 98 F (36.7 C), temperature source Oral, resp. rate 16, height 4\' 11"  (1.499 m), weight 61.7 kg, SpO2 99%. Constitutional: NAD; conversant Eyes: Moist conjunctiva; no lid lag; anicteric Lungs: Normal respiratory effort CV: RRR Psychiatric: Appropriate affect  No results found. However, due to the size of the patient record, not all encounters were searched. Please check Results Review for a complete set of results.  No results found.  A/P: [name] is an 63 y.o. 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 The pathophysiology of chronic anal fissures and hypertrophic anal papilla was discussed at length with associated pictures and illustrations using the ASCRS trifold handout. -We have reviewed options going forward including further observation vs surgery -chemical sphincterotomy (Botox perianal), excision of hypertrophic anal papilla; anorectal exam under anesthesia  -We did discuss a stepup approach in  indurative chronic anal fissures particularly in females. We discussed surgery/lateral internal sphincterotomy as an option as well but did discuss a potential although uncommon risk for fecal incontinence. We discussed that if this were to occur with perianal Botox, it is in the vast majority cases only temporary disturbance. For these reason she would choose to proceed with chemical sphincterotomy first.  -The planned procedure, material risks (including, but not limited to, pain, bleeding, infection, scarring, need for blood transfusion, damage to anal sphincter, incontinence of gas and/or stool, need for additional procedures, anal stenosis, rare cases of pelvic sepsis which in severe cases may require things like a colostomy, recurrence, pneumonia, heart attack, stroke, death) benefits and alternatives to surgery were discussed at length. I noted a good probability that the procedure would help improve their symptoms. The patient's questions were answered to her satisfaction, she voiced understanding and elected to proceed with surgery. Additionally, we discussed typical postoperative expectations and the recovery process.   Beatris Lincoln, MD [facility]

## 2023-11-30 NOTE — Anesthesia Preprocedure Evaluation (Signed)
 Anesthesia Evaluation  Patient identified by MRN, date of birth, ID band Patient awake    Reviewed: Allergy & Precautions, NPO status , Patient's Chart, lab work & pertinent test results  Airway Mallampati: I  TM Distance: >3 FB Neck ROM: Full    Dental no notable dental hx.    Pulmonary neg pulmonary ROS, Current Smoker and Patient abstained from smoking.   Pulmonary exam normal        Cardiovascular hypertension,  Rhythm:Regular Rate:Normal     Neuro/Psych  Headaches  Anxiety     CVA, No Residual Symptoms    GI/Hepatic Neg liver ROS, hiatal hernia,GERD  ,,  Endo/Other  negative endocrine ROS    Renal/GU negative Renal ROS  negative genitourinary   Musculoskeletal  (+) Arthritis , Osteoarthritis,    Abdominal Normal abdominal exam  (+)   Peds  Hematology  (+) Blood dyscrasia, anemia Lab Results      Component                Value               Date                      WBC                      6.3                 11/27/2023                HGB                      10.5 (L)            11/27/2023                HCT                      34.4 (L)            11/27/2023                MCV                      75.8 (L)            11/27/2023                PLT                      290                 11/27/2023              Anesthesia Other Findings   Reproductive/Obstetrics                             Anesthesia Physical Anesthesia Plan  ASA: 2  Anesthesia Plan: MAC   Post-op Pain Management: Tylenol  PO (pre-op)*   Induction: Intravenous  PONV Risk Score and Plan: 1 and Ondansetron , Dexamethasone , Propofol  infusion, Midazolam  and Treatment may vary due to age or medical condition  Airway Management Planned: Simple Face Mask and Nasal Cannula  Additional Equipment: None  Intra-op Plan:   Post-operative Plan:   Informed Consent: I have reviewed the patients History and Physical,  chart, labs and discussed the procedure including the risks, benefits and  alternatives for the proposed anesthesia with the patient or authorized representative who has indicated his/her understanding and acceptance.     Dental advisory given  Plan Discussed with: CRNA  Anesthesia Plan Comments:        Anesthesia Quick Evaluation

## 2023-11-30 NOTE — Op Note (Signed)
 11/30/2023  11:34 AM  PATIENT:  [name]  63 y.o. female  Patient Care Team: Abram Abraham, NP-C as PCP - General (Family Medicine)  PRE-OPERATIVE DIAGNOSIS: Chronic anal fissure, hypertrophic anal papilla  POST-OPERATIVE DIAGNOSIS:   1.  Chronic anal fissure, posterior midline 2.  Internal hemorrhoid with an associated hypertrophic papilla at the apex.  PROCEDURE:   1.  Chemical sphincterotomy, 100 units Botox 2.  Hemorrhoidectomy 3.  Anorectal exam under anesthesia  SURGEON:  Surgeon(s): Melvenia Stabs, MD   ANESTHESIA:   local and MAC  SPECIMEN:  Posterior midline hemorrhoidal tissue and hypertrophic papilla  DISPOSITION OF SPECIMEN:  PATHOLOGY  COUNTS:  Sponge, needle, and instrument counts were reported correct x2 at conclusion.  EBL: 5 mL  PLAN OF CARE: Discharge to home after PACU  PATIENT DISPOSITION:  PACU - hemodynamically stable.  OR FINDINGS: Posterior midline hypertrophic papilla arising on a large internal hemorrhoid.  There is also a small chronic appearing fissure in the posterior midline.  No other noted findings circumferentially on anoscopy.  We excised the hemorrhoidal tissue and the associated papilla and then carried out a chemical sphincterotomy using 100 units of Botox reconstituted and 2 cc of injectable saline.  DESCRIPTION: The patient was identified in the preoperative holding area and taken to the OR where she was placed on the operating room table. SCDs were placed.  MAC anesthesia was induced without difficulty. The patient was then positioned in high lithotomy with Allen stirrups. Pressure points were then evaluated and padded.  She was then prepped and draped in usual sterile fashion.  A surgical timeout was performed indicating the correct patient, procedure, and positioning.  A perianal block was performed using a dilute mixture of 0.25% Marcaine  with epinephrine  and Exparel .   After ascertaining that an appropriate level  of anesthesia had been achieved, a well lubricated digital rectal exam was performed. This demonstrated no palpable masses.  Externally, the perianal skin is normal in appearance.  A Hill-Ferguson anoscope was into the anal canal and circumferential inspection demonstrated healthy appearing anoderm.  In the posterior midline there is a short tract anal fissure approximately 1 cm in length with visible internal sphincter muscle.  Arising just above this is an internal hemorrhoid that has been prolapsing with a small hypertrophic papilla at the apex.  Additional local anesthetic was infiltrated in the posterior midline.  The hemorrhoidal tissue was then elevated the DeBakey forcep.  With the anoscope in place, this internal hemorrhoidal tissue was excised along with the associated hypertrophic papilla.  Hemostasis is achieved electrocautery.  The hemorrhoidal defect was closed using a 3-0 chromic suture.  Anal canal was inspected and hemostasis appreciated.  100 units of Botox was brought onto the field and reconstituted in 2 cc of injectable saline.  The Botox was then injected into the intersphincteric groove in small aliquots using a TB syringe.  This was done circumferentially up to the level of the dentate line.  The anal canal was then reinspected.  Hemostasis is verified.  A piece of Surgifoam bullet was then placed within the anal canal at conclusion to aid in maintenance of hemostasis.  All sponge, needle, and instrument counts are reported correct.  She is then taken out of lithotomy position, awakened from anesthesia, and transferred to a stretcher for transport to PACU in satisfactory condition.  DISPOSITION: PACU in satisfactory condition.

## 2023-11-30 NOTE — Anesthesia Postprocedure Evaluation (Signed)
 Anesthesia Post Note  Patient: [name]  Procedure(s) Performed: INJECTION, CHEMODENERVATION AGENT REMOVAL, CONDYLOMA EXAM UNDER ANESTHESIA, RECTUM     Patient location during evaluation: PACU Anesthesia Type: MAC Level of consciousness: awake and alert Pain management: pain level controlled Vital Signs Assessment: post-procedure vital signs reviewed and stable Respiratory status: spontaneous breathing, nonlabored ventilation, respiratory function stable and patient connected to nasal cannula oxygen Cardiovascular status: stable and blood pressure returned to baseline Postop Assessment: no apparent nausea or vomiting Anesthetic complications: no   No notable events documented.  Last Vitals:  Vitals:   11/30/23 1341 11/30/23 1345  BP: (P) 90/79 (!) 112/93  Pulse: 70 78  Resp: 16   Temp: 36.4 C   SpO2: 100% 98%    Last Pain:  Vitals:   11/30/23 1341  TempSrc: (P) Oral  PainSc: 2                  Valente Gaskin Ether Wolters

## 2023-11-30 NOTE — Transfer of Care (Signed)
 Immediate Anesthesia Transfer of Care Note  Patient: [name]  Procedure(s) Performed: INJECTION, CHEMODENERVATION AGENT REMOVAL, CONDYLOMA EXAM UNDER ANESTHESIA, RECTUM  Patient Location: PACU  Anesthesia Type:General  Level of Consciousness: drowsy and patient cooperative  Airway & Oxygen Therapy: Patient Spontanous Breathing  Post-op Assessment: Report given to RN and Post -op Vital signs reviewed and stable  Post vital signs: Reviewed and stable  Last Vitals:  Vitals Value Taken Time  BP 113/67 11/30/23 1141  Temp    Pulse 71 11/30/23 1144  Resp 18 11/30/23 1144  SpO2 98 % 11/30/23 1144  Vitals shown include unfiled device data.  Last Pain:  Vitals:   11/30/23 0916  TempSrc:   PainSc: 0-No pain         Complications: No notable events documented.

## 2023-12-01 ENCOUNTER — Encounter (HOSPITAL_COMMUNITY): Payer: Self-pay | Admitting: Surgery

## 2023-12-03 LAB — SURGICAL PATHOLOGY

## 2023-12-04 ENCOUNTER — Ambulatory Visit (HOSPITAL_COMMUNITY): Admission: EM | Admit: 2023-12-04 | Discharge: 2023-12-04 | Disposition: A | Discharge status: Home / Self Care

## 2023-12-04 DIAGNOSIS — G8918 Other acute postprocedural pain: Secondary | ICD-10-CM

## 2023-12-04 NOTE — ED Notes (Signed)
 Patient is being discharged from the Urgent Care and sent to the Emergency Department via POV . Per Eveleen Hinds, NP, patient is in need of higher level of care due to post rectal surgery/limited resources. Patient is aware and verbalizes understanding of plan of care.  Vitals:   12/04/23 0820  BP: (!) 159/106  Pulse: 96  Temp: 98.2 F (36.8 C)  SpO2: 95%

## 2023-12-04 NOTE — ED Triage Notes (Signed)
 Patient had rectal surgery on 6/6.  Called doctor yesterday.  Was taken out of work.  Patient did have a bm today and it was soft.  This was not the first BM since surgery.  Patient is having extreme pressure.  Unable to sit comfortably or lie down.  Patient has not taken any pain medicine today. Reports instructed to only take this med 5 days .

## 2023-12-04 NOTE — Discharge Instructions (Addendum)
 Please follow up with your surgeon regarding post op pain, if you are unable to be evaluated by your surgeon please go to Er for further evaluation and management.

## 2023-12-04 NOTE — ED Provider Notes (Signed)
 [facility]    CSN: [id number] Arrival date & time: 12/04/23  1308      History   Chief Complaint No chief complaint on file.   HPI [name] is a 63 y.o. female.   63 year old female, [name], presents to urgent care for evaluation of severe postop rectal pain.  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  Patient states she has not take anything for pain today as she is unable to sit down or move or get comfortable, patient states she did call her doctor yesterday and is supposed to follow up with them.  Pt has had soft BM since surgery, no rectal bleeding.  The history is provided by the patient. No language interpreter was used.    Past Medical History:  Diagnosis Date   Anemia    Arthritis    "neck, hips" (04/25/2018)   Claustrophobia    Daily headache    no longer a problem   GERD (gastroesophageal reflux disease)    Hiatal hernia    Hyperlipidemia    "hx" (04/25/2018)   Hypertension    Plantar fasciitis    hx - left foot   Pneumonia    Stroke (HCC) 05/2014   "mini stroke" (04/25/2018)    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   Cough due to ACE inhibitor 05/16/2022   Myalgia 05/16/2022   Acute bronchitis 05/16/2022   Hypomagnesemia 03/19/2022   Vitamin B 12 deficiency 03/19/2022   Low TSH level 03/19/2022   Right elbow pain 03/19/2022   Encounter for screening mammogram for malignant neoplasm of breast 03/19/2022   History of hypertension 12/13/2021   History of smoking 12/13/2021   Elevated blood pressure reading 12/13/2021   Contusion of right knee 12/13/2021   Fall 12/13/2021   Muscle cramps 12/04/2021    Elevated liver enzymes 12/04/2021   Paresthesias 12/04/2021   Elevated LFTs 12/04/2021   Anemia 10/20/2021   Chronic foot pain 10/20/2021   Folic acid  deficiency 09/19/2021   Chronic pain syndrome 09/07/2021   Status post lumbar spine surgery for decompression of spinal cord 05/24/2021   Dyspepsia 05/23/2021   History of gastroesophageal reflux (GERD) 05/23/2021   History of lumbar laminectomy for spinal cord decompression 04/26/2021   Lumbar stenosis 04/13/2021   Tension headache 12/22/2020   Low back pain 10/20/2020   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/28/2020   Hypokalemia 12/17/2019   Chronic diarrhea 09/14/2017   History of stroke 06/12/2014   Cervical radiculitis 02/12/2014   Essential hypertension, benign 01/24/2010   Microcytic anemia 01/11/2010   Hyperlipemia 05/28/2007   Current smoker 05/28/2007   GERD 05/28/2007    Past Surgical History:  Procedure Laterality Date   ABDOMINAL HYSTERECTOMY  12/09   Secondary to fibroids   ARTERY BIOPSY Right 04/10/2018   Procedure: BIOPSY TEMPORAL ARTERY;  Surgeon: Mayo Speck, MD;  Location: [facility] OR;  Service: Vascular;  Laterality: Right;   CESAREAN SECTION  1986; 1988   COLONOSCOPY     CONDYLOMA EXCISION/FULGURATION N/A 11/30/2023   Procedure: REMOVAL, CONDYLOMA;  Surgeon: Melvenia Stabs, MD;  Location: [facility];  Service: General;  Laterality: N/A;  Excision of hypertrophic anal papilla  FOOT SURGERY Left 03/2017   Bone Spurs Removed   INJECTION, CHEMODENERVATION AGENT N/A 11/30/2023   Procedure: INJECTION, CHEMODENERVATION AGENT;  Surgeon: Melvenia Stabs, MD;  Location: [facility];  Service: General;  Laterality: N/A;   LUMBAR LAMINECTOMY/DECOMPRESSION MICRODISCECTOMY N/A 04/13/2021   Procedure: LUMBAR FOUR- LUMBAR FIVE AND LUMBAR FIVE-SACRAL ONE  DECOMPRESSION;  Surgeon: Adah Acron, MD;  Location: [facility] OR;  Service: Orthopedics;  Laterality: N/A;   RADIOLOGY WITH ANESTHESIA N/A 04/26/2018   Procedure: MRI WITH  ANESTHESIA;  Surgeon: Radiologist, Medication, MD;  Location: [facility] OR;  Service: Radiology;  Laterality: N/A;   RADIOLOGY WITH ANESTHESIA N/A 08/24/2019   Procedure: MRI WITH ANESTHESIA CERVICAL SPINE;  Surgeon: Radiologist, Medication, MD;  Location: [facility] OR;  Service: Radiology;  Laterality: N/A;   RADIOLOGY WITH ANESTHESIA N/A 10/02/2019   Procedure: MRI WITH ANESTHESIA CERVICAL WITHOUT CONTRAST;  Surgeon: Radiologist, Medication, MD;  Location: [facility];  Service: Radiology;  Laterality: N/A;   RECTAL EXAM UNDER ANESTHESIA N/A 11/30/2023   Procedure: EXAM UNDER ANESTHESIA, RECTUM;  Surgeon: Melvenia Stabs, MD;  Location: [facility];  Service: General;  Laterality: N/A;   TUBAL LIGATION  1988    OB History   No obstetric history on file.      Home Medications    Prior to Admission medications   Medication Sig Start Date End Date Taking? Authorizing Provider  albuterol  (VENTOLIN  HFA) 108 (90 Base) MCG/ACT inhaler Inhale 2 puffs into the lungs every 6 (six) hours as needed for wheezing or shortness of breath. 04/25/23   Zorita Hiss, NP  amLODipine  (NORVASC ) 5 MG tablet Take 1 tablet (5 mg total) by mouth daily. 06/04/23   Henson, Vickie L, NP-C  cyclobenzaprine  (FLEXERIL ) 10 MG tablet Take 1 tablet (10 mg total) by mouth at bedtime. Patient not taking: Reported on 11/22/2023 10/17/23   Williams, Megan E, NP  diazepam  (VALIUM ) 5 MG tablet Take one tablet by mouth with food one hour prior to procedure. Patient not taking: Reported on 11/22/2023 10/17/23   Williams, Megan E, NP  diclofenac  Sodium (VOLTAREN ) 1 % GEL Apply 2 g topically 4 (four) times daily. Patient not taking: Reported on 11/22/2023 03/15/22   Abram Abraham, NP-C  diltiazem  2 % GEL Please apply 5 times daily Patient not taking: Reported on 11/22/2023 10/05/22   Tobin Forts, MD  ferrous sulfate  325 (65 FE) MG tablet Take 325 mg by mouth daily.    [provider]  folic acid  (FOLVITE ) 1 MG tablet Take 1 tablet (1 mg total) by  mouth daily. 08/02/23   Henson, Vickie L, NP-C  lidocaine  (XYLOCAINE ) 5 % ointment APPLY 1 APPLICATION TOPICALLY TWICE DAILY AS NEEDED 09/25/23   Kennedy-Smith, Colleen M, NP  lipase/protease/amylase (CREON ) 36000 UNITS CPEP capsule Take 3 capsules (108,000 Units total) by mouth 3 (three) times daily with meals AND 2 capsules (72,000 Units total) with snacks. Patient taking differently: Take 4 capsules by mouth 3 times daily with meals AND 2 capsules with snacks. 10/05/22   Tobin Forts, MD  meloxicam  (MOBIC ) 15 MG tablet TAKE 1 TABLET BY MOUTH ONCE DAILY AS NEEDED FOR PAIN Patient not taking: Reported on 11/22/2023 08/07/23   Abram Abraham, NP-C  methylPREDNISolone  (MEDROL  DOSEPAK) 4 MG TBPK tablet Take as directed with food Patient not taking: Reported on 11/22/2023 10/10/23   Persons, Norma Beckers, PA  omega-3 acid ethyl esters (LOVAZA ) 1 g capsule Take 2 capsules (2 g total) by mouth 2 (two) times  daily. Patient not taking: Reported on 11/22/2023 04/23/23   Alyson Back L, NP-C  ondansetron  (ZOFRAN -ODT) 8 MG disintegrating tablet Take 1 tablet (8 mg total) by mouth every 8 (eight) hours as needed for nausea or vomiting. 08/06/23   Carlee Charters, NP  pantoprazole  (PROTONIX ) 40 MG tablet Take 1 tablet (40 mg total) by mouth 2 (two) times daily. Take 30 minutes before breakfast. 10/02/23   Kennedy-Smith, Colleen M, NP  potassium chloride  (KLOR-CON  M) 10 MEQ tablet Take 1 tablet (10 mEq total) by mouth daily. 10/01/23   Henson, Vickie L, NP-C  prochlorperazine  (COMPAZINE ) 10 MG tablet Take 1 tablet (10 mg total) by mouth 2 (two) times daily as needed for nausea or vomiting. 08/09/23   Roemhildt, Lorin T, PA-C  traMADol  (ULTRAM ) 50 MG tablet Take 1 tablet (50 mg total) by mouth every 6 (six) hours as needed for up to 5 days (postop pain not controlled with tylenol  and ibuprofen  first). 11/30/23 12/05/23  Melvenia Stabs, MD    Family History Family History  Problem Relation Age of Onset   Pancreatic  cancer Mother 51   Heart disease Mother    Esophageal cancer Father 71   Heart disease Sister    Breast cancer Maternal Aunt 60   Colon cancer Maternal Uncle    Pancreatic cancer Maternal Uncle    Colon cancer Paternal Uncle    Rectal cancer Neg Hx    Stomach cancer Neg Hx     Social History Social History   Tobacco Use   Smoking status: Some Days    Current packs/day: 0.25    Average packs/day: 0.3 packs/day for 38.0 years (9.5 ttl pk-yrs)    Types: Cigarettes   Smokeless tobacco: Never   Tobacco comments:    1 cigs/day 08/17/2022 km,cma  Vaping Use   Vaping status: Never Used  Substance Use Topics   Alcohol use: Yes    Alcohol/week: 4.0 standard drinks of alcohol    Types: 4 Cans of beer per week   Drug use: Not Currently     Allergies   Aspirin, Hydromorphone hcl, Crestor  [rosuvastatin ], and Latex   Review of Systems Review of Systems  Constitutional:  Negative for fever.  Gastrointestinal:  Positive for rectal pain.  All other systems reviewed and are negative.    Physical Exam Triage Vital Signs ED Triage Vitals [12/04/23 0820]  Encounter Vitals Group     BP (!) 159/106     Systolic BP Percentile      Diastolic BP Percentile      Pulse Rate 96     Resp      Temp 98.2 F (36.8 C)     Temp Source Oral     SpO2 95 %     Weight      Height      Head Circumference      Peak Flow      Pain Score      Pain Loc      Pain Education      Exclude from Growth Chart    No data found.  Updated Vital Signs BP (!) 159/106 (BP Location: Right Arm)   Pulse 96   Temp 98.2 F (36.8 C) (Oral)   LMP  (LMP Unknown)   SpO2 95%   Visual Acuity Right Eye Distance:   Left Eye Distance:   Bilateral Distance:    Right Eye Near:   Left Eye Near:    Bilateral Near:  Physical Exam Vitals and nursing note reviewed.      UC Treatments / Results  Labs (all labs ordered are listed, but only abnormal results are displayed) Labs Reviewed - No data to  display  EKG   Radiology No results found.  Procedures Procedures (including critical care time)  Medications Ordered in UC Medications - No data to display  Initial Impression / Assessment and Plan / UC Course  I have reviewed the triage vital signs and the nursing notes.  Pertinent labs & imaging results that were available during my care of the patient were reviewed by me and considered in my medical decision making (see chart for details).    Patient has past medical history of hypertension, patient recently had surgery on 6/6 for fistula.  Courage patient to take her pain medicine as prescribed and contact her surgeon, if patient is unable to get in touch with her surgeon recommend she go to the emergency room for further evaluation.  Patient verbalized understanding this provider  Ddx: Post op pain,rectal pain, surgical problem, HTN Final Clinical Impressions(s) / UC Diagnoses   Final diagnoses:  Post-op pain     Discharge Instructions      Please follow up with your surgeon regarding post op pain, if you are unable to be evaluated by your surgeon please go to Er for further evaluation and management.  ED Prescriptions   None    PDMP not reviewed this encounter.   Peter Brands, NP 12/04/23 [phone]

## 2023-12-06 ENCOUNTER — Other Ambulatory Visit: Payer: Self-pay

## 2023-12-06 ENCOUNTER — Other Ambulatory Visit (HOSPITAL_COMMUNITY): Payer: Self-pay

## 2023-12-06 ENCOUNTER — Other Ambulatory Visit: Payer: Self-pay | Admitting: Family Medicine

## 2023-12-06 DIAGNOSIS — E538 Deficiency of other specified B group vitamins: Secondary | ICD-10-CM

## 2023-12-06 MED ORDER — FOLIC ACID 1 MG PO TABS
1.0000 mg | ORAL_TABLET | Freq: Every day | ORAL | 0 refills | Status: DC
  Filled 2023-12-06: qty 90, 90d supply, fill #0

## 2023-12-10 ENCOUNTER — Telehealth: Payer: Self-pay | Admitting: Physician Assistant

## 2023-12-10 NOTE — Telephone Encounter (Signed)
 Called the patient to reschedule missed appointments and she said she would call back to reschedule due to just having surgery.

## 2023-12-14 ENCOUNTER — Ambulatory Visit: Admitting: Physical Medicine and Rehabilitation

## 2023-12-24 ENCOUNTER — Other Ambulatory Visit: Payer: Self-pay | Admitting: Family Medicine

## 2023-12-24 ENCOUNTER — Other Ambulatory Visit: Payer: Self-pay | Admitting: Nurse Practitioner

## 2023-12-24 ENCOUNTER — Other Ambulatory Visit (HOSPITAL_COMMUNITY): Payer: Self-pay

## 2023-12-24 MED ORDER — POTASSIUM CHLORIDE CRYS ER 10 MEQ PO TBCR
10.0000 meq | EXTENDED_RELEASE_TABLET | Freq: Every day | ORAL | 2 refills | Status: DC
  Filled 2023-12-24 – 2023-12-31 (×3): qty 30, 30d supply, fill #0

## 2023-12-24 MED ORDER — PANTOPRAZOLE SODIUM 40 MG PO TBEC
40.0000 mg | DELAYED_RELEASE_TABLET | Freq: Two times a day (BID) | ORAL | 2 refills | Status: DC
  Filled 2023-12-24 – 2023-12-31 (×3): qty 60, 30d supply, fill #0
  Filled 2024-01-25: qty 60, 30d supply, fill #1
  Filled 2024-02-26: qty 60, 30d supply, fill #2

## 2023-12-25 ENCOUNTER — Other Ambulatory Visit (HOSPITAL_COMMUNITY): Payer: Self-pay

## 2023-12-27 ENCOUNTER — Other Ambulatory Visit (HOSPITAL_COMMUNITY): Payer: Self-pay

## 2023-12-31 ENCOUNTER — Other Ambulatory Visit (HOSPITAL_COMMUNITY): Payer: Self-pay

## 2024-01-01 ENCOUNTER — Other Ambulatory Visit (HOSPITAL_COMMUNITY): Payer: Self-pay

## 2024-01-11 ENCOUNTER — Ambulatory Visit: Admitting: Family Medicine

## 2024-01-14 ENCOUNTER — Other Ambulatory Visit: Payer: Self-pay | Admitting: Nurse Practitioner

## 2024-01-14 ENCOUNTER — Ambulatory Visit: Admitting: Physical Medicine and Rehabilitation

## 2024-01-14 ENCOUNTER — Encounter: Payer: Self-pay | Admitting: Physical Medicine and Rehabilitation

## 2024-01-14 DIAGNOSIS — M47816 Spondylosis without myelopathy or radiculopathy, lumbar region: Secondary | ICD-10-CM | POA: Diagnosis not present

## 2024-01-14 DIAGNOSIS — M961 Postlaminectomy syndrome, not elsewhere classified: Secondary | ICD-10-CM

## 2024-01-14 DIAGNOSIS — M545 Low back pain, unspecified: Secondary | ICD-10-CM | POA: Diagnosis not present

## 2024-01-14 DIAGNOSIS — M47817 Spondylosis without myelopathy or radiculopathy, lumbosacral region: Secondary | ICD-10-CM

## 2024-01-14 DIAGNOSIS — G8929 Other chronic pain: Secondary | ICD-10-CM

## 2024-01-14 MED ORDER — DIAZEPAM 5 MG PO TABS: ORAL_TABLET | ORAL | 0 refills | Status: DC

## 2024-01-14 MED ORDER — CYCLOBENZAPRINE HCL 10 MG PO TABS: 10.0000 mg | ORAL_TABLET | Freq: Every day | ORAL | 0 refills | Status: DC

## 2024-01-14 MED ORDER — MELOXICAM 15 MG PO TABS: 15.0000 mg | ORAL_TABLET | Freq: Every day | ORAL | 0 refills | Status: DC

## 2024-01-14 NOTE — Progress Notes (Signed)
 Pain Scale   Average Pain 10 Patient advising she has chronic lower back pain, without any relief. Patient here for MRI review.        +Driver, -BT, -Dye Allergies.

## 2024-01-14 NOTE — Telephone Encounter (Signed)
 Inbound call from patient, requesting refill for Lidocaine  ointment   Please advise  Thank you

## 2024-01-14 NOTE — Progress Notes (Signed)
 Core Outcome Measures Index (COMI) Back Score  Average Pain 8  COMI Score 80%

## 2024-01-14 NOTE — Progress Notes (Signed)
 LETONYA MANGELS - 63 y.o. female MRN [id number]  Date of birth: 16-Jul-1960  Office Visit Note: Visit Date: 01/14/2024 PCP: Lendia Boby CROME, NP-C Referred by: Lendia Boby CROME, NP-C  Subjective: Chief Complaint  Patient presents with   Lower Back - Pain   HPI: SAYDI KOBEL is a 63 y.o. female who comes in today for evaluation of chronic, worsening and severe bilateral lower back pain, intermittent radiation of pain down posterior legs. Pain ongoing for several years, worsens with standing and walking. Also reports pain with prolonged sitting. She describes pain as dull and aching sensation, currently rates as 8 out of 10. Reports difficulty sleeping due to severe pain. Some relief of pain with home exercise regimen, rest and use of medications. History of formal physical therapy several years ago, states these treatments caused increased pain. She has tried lumbar brace in the past with no relief of pain. History of L4-L5 and L5-S1 decompression with Dr. Barbarann on 04/13/2021. Recent lumbar MRI imaging shows postlaminectomy changes at L4-5 and L5-S1, no significant residual stenosis. There is stable moderate foraminal stenosis and moderate facet arthropathy at these levels. She reports her lower back is biggest pain generator. She is currently working as Arboriculturist at [employer], reports difficulty performing job duties due to increased pain. States Dr. Barbarann did mention chronic pain management in the past, however she is open to trying other treatments. Patient denies focal weakness, numbness and tingling. No recent trauma or falls.      Review of Systems  Musculoskeletal:  Positive for back pain and myalgias.  Neurological:  Negative for tingling, sensory change, focal weakness and weakness.  All other systems reviewed and are negative.  Otherwise per HPI.  Assessment & Plan: Visit Diagnoses:    ICD-10-CM   1. Chronic bilateral low back pain without sciatica   M54.50 Ambulatory referral to Physical Medicine Rehab   G89.29     2. Spondylosis without myelopathy or radiculopathy, lumbosacral region  M47.817 Ambulatory referral to Physical Medicine Rehab    3. Facet arthropathy, lumbar  M47.816 Ambulatory referral to Physical Medicine Rehab    4. Post laminectomy syndrome  M96.1 Ambulatory referral to Physical Medicine Rehab       Plan: Findings:  Chronic, worsening and severe bilateral lower back pain, intermittent radiation of pain down posterior legs. Bilateral lower back is biggest pain generator at this time. I discussed recent lumbar MRI with her today using imaging and spine model. Patients clinical presentation and exam are consistent with facet mediated pain. She does have pain with lumbar extension today. There is moderate facet arthropathy at L4-L5 and L5-S1. We discussed treatment plan in detail today. Next step is to place order for bilateral L4-L5 and L5-S1 facet joint injections under fluoroscopic guidance. If good relief of pain with injections, we can repeat this procedure infrequently as needed. I discussed injection procedure with patient today in detail, she has no questions. I did prescribe pre-procedure Valium  for her to take on day of injection procedure. We also discussed medication management and I prescribed Meloxicam  and Flexeril . Should her pain persists we would consider lumbar epidural steroid injection vs referral for chronic pain management. No red flag symptoms noted upon exam today.     Meds & Orders:  Meds ordered this encounter  Medications   meloxicam  (MOBIC ) 15 MG tablet    Sig: Take 1 tablet (15 mg total) by mouth daily.    Dispense:  30 tablet  Refill:  0   cyclobenzaprine  (FLEXERIL ) 10 MG tablet    Sig: Take 1 tablet (10 mg total) by mouth at bedtime.    Dispense:  30 tablet    Refill:  0   diazepam  (VALIUM ) 5 MG tablet    Sig: Take one tablet by mouth with light food one hour prior to procedure.     Dispense:  1 tablet    Refill:  0    Orders Placed This Encounter  Procedures   Ambulatory referral to Physical Medicine Rehab    Follow-up: Return for Bilateral L4-L5 and L5-S1 facet joint injections.   Procedures: No procedures performed      Clinical History: MR LUMBAR SPINE WITHOUT IV CONTRAST   COMPARISON: 03/10/2021   CLINICAL HISTORY: Low back pain, symptoms persist with conservative treatment.   TECHNIQUE: SAG T2, SAG T1, SAG STIR, AX T2, AX T1 without IV contrast.   FINDINGS: There is normal alignment of the lumbar spine. Mild multilevel disc desiccation is present with moderate facet arthrosis in the lower lumbar spine. Postlaminectomy changes are seen at L4-5 and L5-S1. There is no vertebral body height loss, subluxation or marrow replacing process. The sacrum and SI joints are unremarkable so far as visualized. Conus and cauda equina are unremarkable.   T12-L1: Mild facet arthrosis. No significant foraminal or spinal stenosis.   L1-2: Mild facet arthrosis. No significant foraminal or spinal stenosis.   L2-3: Mild broad-based bulge without significant foraminal or spinal stenosis. Mild facet arthrosis is present.   L3-4: Mild broad-based disc osteophyte without significant foraminal or spinal stenosis. Mild-to-moderate facet arthrosis is present.   L4-5: There is a broad-based bulge and moderate facet arthrosis slightly crowding the descending nerve roots in the lateral recess. There is moderate bilateral foraminal narrowing. Correlation for L4 radiculopathy. Laminectomy changes are present.   L5-S1: Broad-based disc osteophyte with moderate facet arthrosis. There is caudal foraminal narrowing bilaterally. Correlation for L5 radicular symptoms. No significant spinal stenosis. Prior laminectomy.   The retroperitoneal structures demonstrate no significant abnormality.   IMPRESSION: Postlaminectomy changes at L4-5 and L5-S1. There is no significant  residual spinal stenosis. There is stable moderate foraminal narrowing at L4-5 and L5-S1 bilaterally. Correlation for mild radicular symptoms.   Electronically signed by: Norleen Satchel MD 11/26/2023 05:34 PM EDT RP Workstation: [id number]   She reports that she has been smoking cigarettes. She has a 9.5 pack-year smoking history. She has never used smokeless tobacco. No results for input(s): HGBA1C, LABURIC in the last 8760 hours.  Objective:  VS:  HT:    WT:   BMI:     BP:   HR: bpm  TEMP: ( )  RESP:  Physical Exam Vitals and nursing note reviewed.  HENT:     Head: Normocephalic and atraumatic.     Right Ear: External ear normal.     Left Ear: External ear normal.     Nose: Nose normal.     Mouth/Throat:     Mouth: Mucous membranes are moist.  Eyes:     Extraocular Movements: Extraocular movements intact.  Cardiovascular:     Rate and Rhythm: Normal rate.     Pulses: Normal pulses.  Pulmonary:     Effort: Pulmonary effort is normal.  Abdominal:     General: Abdomen is flat. There is no distension.  Musculoskeletal:        General: Tenderness present.     Cervical back: Normal range of motion.     Comments: Patient rises  from seated position to standing without difficulty. Pain noted with facet loading and lumbar extension. 5/5 strength noted with bilateral hip flexion, knee flexion/extension, ankle dorsiflexion/plantarflexion and EHL. No clonus noted bilaterally. No pain upon palpation of greater trochanters. No pain with internal/external rotation of bilateral hips. Sensation intact bilaterally. Myofascial tenderness noted to bilateral lumbar paraspinal region upon palpation. Negative slump test bilaterally. Ambulates without aid, gait steady.     Skin:    General: Skin is warm and dry.     Capillary Refill: Capillary refill takes less than 2 seconds.  Neurological:     General: No focal deficit present.     Mental Status: She is alert and oriented to person, place,  and time.  Psychiatric:        Mood and Affect: Mood normal.        Behavior: Behavior normal.     Ortho Exam  Imaging: No results found.  Past Medical/Family/Surgical/Social History: Medications & Allergies reviewed per EMR, new medications updated. 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   Cough due to ACE inhibitor 05/16/2022   Myalgia 05/16/2022   Acute bronchitis 05/16/2022   Hypomagnesemia 03/19/2022   Vitamin B 12 deficiency 03/19/2022   Low TSH level 03/19/2022   Right elbow pain 03/19/2022   Encounter for screening mammogram for malignant neoplasm of breast 03/19/2022   History of hypertension 12/13/2021   History of smoking 12/13/2021   Elevated blood pressure reading 12/13/2021   Contusion of right knee 12/13/2021   Fall 12/13/2021   Muscle cramps 12/04/2021   Elevated liver enzymes 12/04/2021   Paresthesias 12/04/2021   Elevated LFTs 12/04/2021   Anemia 10/20/2021   Chronic foot pain 10/20/2021   Folic acid  deficiency 09/19/2021   Chronic pain syndrome 09/07/2021   Status post lumbar spine surgery for decompression of spinal cord 05/24/2021   Dyspepsia 05/23/2021   History of gastroesophageal reflux (GERD) 05/23/2021   History of lumbar laminectomy for spinal cord decompression 04/26/2021   Lumbar stenosis 04/13/2021   Tension headache 12/22/2020   Low back pain 10/20/2020   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/28/2020   Hypokalemia 12/17/2019   Chronic diarrhea 09/14/2017   History of stroke 06/12/2014   Cervical radiculitis 02/12/2014   Essential hypertension, benign 01/24/2010   Microcytic anemia 01/11/2010   Hyperlipemia 05/28/2007   Current smoker 05/28/2007   GERD 05/28/2007   Past Medical History:   Diagnosis Date   Anemia    Arthritis    neck, hips (04/25/2018)   Claustrophobia    Daily headache    no longer a problem   GERD (gastroesophageal reflux disease)    Hiatal hernia    Hyperlipidemia    hx (04/25/2018)   Hypertension    Plantar fasciitis    hx - left foot   Pneumonia    Stroke (HCC) 05/2014   mini stroke (04/25/2018)   Family History  Problem Relation Age of Onset   Pancreatic cancer Mother 30   Heart disease Mother    Esophageal cancer Father 64   Heart disease Sister    Breast cancer Maternal Aunt 60   Colon cancer Maternal Uncle    Pancreatic cancer Maternal Uncle    Colon cancer Paternal Uncle    Rectal cancer Neg Hx  Stomach cancer Neg Hx    Past Surgical History:  Procedure Laterality Date   ABDOMINAL HYSTERECTOMY  12/09   Secondary to fibroids   ARTERY BIOPSY Right 04/10/2018   Procedure: BIOPSY TEMPORAL ARTERY;  Surgeon: Oris Krystal FALCON, MD;  Location: [facility];  Service: Vascular;  Laterality: Right;   CESAREAN SECTION  1986; 1988   COLONOSCOPY     CONDYLOMA EXCISION/FULGURATION N/A 11/30/2023   Procedure: REMOVAL, CONDYLOMA;  Surgeon: Teresa Lonni HERO, MD;  Location: [facility];  Service: General;  Laterality: N/A;  Excision of hypertrophic anal papilla   FOOT SURGERY Left 03/2017   Bone Spurs Removed   INJECTION, CHEMODENERVATION AGENT N/A 11/30/2023   Procedure: INJECTION, CHEMODENERVATION AGENT;  Surgeon: Teresa Lonni HERO, MD;  Location: [facility];  Service: General;  Laterality: N/A;   LUMBAR LAMINECTOMY/DECOMPRESSION MICRODISCECTOMY N/A 04/13/2021   Procedure: LUMBAR FOUR- LUMBAR FIVE AND LUMBAR FIVE-SACRAL ONE  DECOMPRESSION;  Surgeon: Barbarann Oneil BROCKS, MD;  Location: [facility];  Service: Orthopedics;  Laterality: N/A;   RADIOLOGY WITH ANESTHESIA N/A 04/26/2018   Procedure: MRI WITH ANESTHESIA;  Surgeon: Radiologist, Medication, MD;  Location: [facility];  Service: Radiology;  Laterality: N/A;   RADIOLOGY WITH ANESTHESIA N/A 08/24/2019   Procedure:  MRI WITH ANESTHESIA CERVICAL SPINE;  Surgeon: Radiologist, Medication, MD;  Location: [facility];  Service: Radiology;  Laterality: N/A;   RADIOLOGY WITH ANESTHESIA N/A 10/02/2019   Procedure: MRI WITH ANESTHESIA CERVICAL WITHOUT CONTRAST;  Surgeon: Radiologist, Medication, MD;  Location: [facility];  Service: Radiology;  Laterality: N/A;   RECTAL EXAM UNDER ANESTHESIA N/A 11/30/2023   Procedure: EXAM UNDER ANESTHESIA, RECTUM;  Surgeon: Teresa Lonni HERO, MD;  Location: [facility];  Service: General;  Laterality: N/A;   TUBAL LIGATION  1988   Social History   Occupational History   Not on file  Tobacco Use   Smoking status: Some Days    Current packs/day: 0.25    Average packs/day: 0.3 packs/day for 38.0 years (9.5 ttl pk-yrs)    Types: Cigarettes   Smokeless tobacco: Never   Tobacco comments:    1 cigs/day 08/17/2022 km,cma  Vaping Use   Vaping status: Never Used  Substance and Sexual Activity   Alcohol use: Yes    Alcohol/week: 4.0 standard drinks of alcohol    Types: 4 Cans of beer per week   Drug use: Not Currently   Sexual activity: Not Currently    Birth control/protection: Surgical    Comment: Hysterectomy

## 2024-01-14 NOTE — Telephone Encounter (Signed)
 It appears [name], NP order refill today.

## 2024-01-15 ENCOUNTER — Encounter: Payer: Self-pay | Admitting: Physical Medicine and Rehabilitation

## 2024-01-15 ENCOUNTER — Telehealth: Payer: Self-pay | Admitting: Physical Medicine and Rehabilitation

## 2024-01-15 NOTE — Telephone Encounter (Signed)
 Pt called stating she seen [name] yesterday for her back. She is asking for a out of work note for today's date. Pt states her back is still hurting. Please call pt when ready for pick up. Pt phone number is [phone]. Please call As Soon as you can for pick up so pt can give to employer

## 2024-01-15 NOTE — Telephone Encounter (Signed)
 Work note taken up front to down stairs reception.

## 2024-01-18 ENCOUNTER — Other Ambulatory Visit: Payer: Self-pay | Admitting: Family Medicine

## 2024-01-18 ENCOUNTER — Other Ambulatory Visit (HOSPITAL_COMMUNITY): Payer: Self-pay

## 2024-01-18 DIAGNOSIS — I10 Essential (primary) hypertension: Secondary | ICD-10-CM

## 2024-01-21 ENCOUNTER — Other Ambulatory Visit (HOSPITAL_COMMUNITY): Payer: Self-pay

## 2024-01-21 MED ORDER — AMLODIPINE BESYLATE 5 MG PO TABS
5.0000 mg | ORAL_TABLET | Freq: Every day | ORAL | 1 refills | Status: AC
  Filled 2024-01-21: qty 90, 90d supply, fill #0
  Filled 2024-04-24: qty 90, 90d supply, fill #1

## 2024-01-25 ENCOUNTER — Other Ambulatory Visit (HOSPITAL_COMMUNITY): Payer: Self-pay

## 2024-01-25 ENCOUNTER — Ambulatory Visit: Admitting: Family Medicine

## 2024-01-25 ENCOUNTER — Other Ambulatory Visit: Payer: Self-pay

## 2024-01-25 ENCOUNTER — Encounter: Payer: Self-pay | Admitting: Family Medicine

## 2024-01-25 VITALS — BP 126/84 | HR 95 | Temp 97.6°F | Ht 59.0 in | Wt 138.0 lb

## 2024-01-25 DIAGNOSIS — R140 Abdominal distension (gaseous): Secondary | ICD-10-CM

## 2024-01-25 DIAGNOSIS — E781 Pure hyperglyceridemia: Secondary | ICD-10-CM | POA: Diagnosis not present

## 2024-01-25 DIAGNOSIS — I10 Essential (primary) hypertension: Secondary | ICD-10-CM

## 2024-01-25 DIAGNOSIS — E876 Hypokalemia: Secondary | ICD-10-CM

## 2024-01-25 DIAGNOSIS — D649 Anemia, unspecified: Secondary | ICD-10-CM

## 2024-01-25 DIAGNOSIS — F17200 Nicotine dependence, unspecified, uncomplicated: Secondary | ICD-10-CM

## 2024-01-25 DIAGNOSIS — F17210 Nicotine dependence, cigarettes, uncomplicated: Secondary | ICD-10-CM | POA: Diagnosis not present

## 2024-01-25 DIAGNOSIS — E559 Vitamin D deficiency, unspecified: Secondary | ICD-10-CM | POA: Diagnosis not present

## 2024-01-25 DIAGNOSIS — E538 Deficiency of other specified B group vitamins: Secondary | ICD-10-CM | POA: Diagnosis not present

## 2024-01-25 DIAGNOSIS — K219 Gastro-esophageal reflux disease without esophagitis: Secondary | ICD-10-CM

## 2024-01-25 LAB — COMPREHENSIVE METABOLIC PANEL WITH GFR
ALT: 56 U/L — ABNORMAL HIGH (ref 0–35)
AST: 30 U/L (ref 0–37)
Albumin: 4.3 g/dL (ref 3.5–5.2)
Alkaline Phosphatase: 71 U/L (ref 39–117)
BUN: 16 mg/dL (ref 6–23)
CO2: 29 meq/L (ref 19–32)
Calcium: 9.4 mg/dL (ref 8.4–10.5)
Chloride: 103 meq/L (ref 96–112)
Creatinine, Ser: 0.64 mg/dL (ref 0.40–1.20)
GFR: 94.76 mL/min (ref >60.00)
Glucose, Bld: 86 mg/dL (ref 70–99)
Potassium: 3.4 meq/L — ABNORMAL LOW (ref 3.5–5.1)
Sodium: 142 meq/L (ref 135–145)
Total Bilirubin: 0.3 mg/dL (ref 0.2–1.2)
Total Protein: 7.3 g/dL (ref 6.0–8.3)

## 2024-01-25 LAB — CBC
HCT: 33.6 % — ABNORMAL LOW (ref 36.0–46.0)
Hemoglobin: 10.7 g/dL — ABNORMAL LOW (ref 12.0–15.0)
MCHC: 31.9 g/dL (ref 30.0–36.0)
MCV: 70.5 fl — ABNORMAL LOW (ref 78.0–100.0)
Platelets: 281 K/uL (ref 150.0–400.0)
RBC: 4.77 Mil/uL (ref 3.87–5.11)
RDW: 14.5 % (ref 11.5–15.5)
WBC: 6.4 K/uL (ref 4.0–10.5)

## 2024-01-25 LAB — LIPID PANEL
Cholesterol: 213 mg/dL — ABNORMAL HIGH (ref 0–200)
HDL: 45.9 mg/dL (ref >39.00)
LDL Cholesterol: 93 mg/dL (ref 0–99)
NonHDL: 166.94
Total CHOL/HDL Ratio: 5
Triglycerides: 372 mg/dL — ABNORMAL HIGH (ref 0.0–149.0)
VLDL: 74.4 mg/dL — ABNORMAL HIGH (ref 0.0–40.0)

## 2024-01-25 LAB — TSH: TSH: 0.5 u[IU]/mL (ref 0.35–5.50)

## 2024-01-25 LAB — FOLATE: Folate: 18.9 ng/mL (ref >5.9)

## 2024-01-25 LAB — VITAMIN D 25 HYDROXY (VIT D DEFICIENCY, FRACTURES): VITD: 18.85 ng/mL — ABNORMAL LOW (ref 30.00–100.00)

## 2024-01-25 LAB — VITAMIN B12: Vitamin B-12: 293 pg/mL (ref 211–911)

## 2024-01-25 LAB — MAGNESIUM: Magnesium: 1.4 mg/dL — ABNORMAL LOW (ref 1.5–2.5)

## 2024-01-25 MED ORDER — NICOTINE 14 MG/24HR TD PT24: 14.0000 mg | MEDICATED_PATCH | Freq: Every day | TRANSDERMAL | 0 refills | Status: AC

## 2024-01-25 MED ORDER — OMEGA-3-ACID ETHYL ESTERS 1 G PO CAPS: 2.0000 g | ORAL_CAPSULE | Freq: Two times a day (BID) | ORAL | 1 refills | Status: AC

## 2024-01-25 NOTE — Progress Notes (Signed)
 Subjective:     Patient ID: [name], female    DOB: 1961-05-16, 63 y.o.   MRN: [id number]  Chief Complaint  Patient presents with   Medical Management of Chronic Issues    Stomach has been bloated since had ankle surgery in June  Wants something to help her quit smoking    HPI  Discussed the use of AI scribe software for clinical note transcription with the patient, who gave verbal consent to proceed.  History of Present Illness [name] is a 63 year old female with chronic anemia and hypertriglyceridemia who presents for follow-up on chronic health conditions and abdominal bloating. She would also like to discuss smoking cessation  Abdominal bloating and gastrointestinal symptoms - Abdominal bloating present since June - Associated with gassiness and frequent flatulence - Occasional morning nausea without vomiting or appetite changes - No changes in bowel habits - Daily use of stool softeners following rectal surgery - No over-the-counter remedies attempted - Diet includes occasional carbonated beverages, beans, and onions  Chronic anemia and vitamin b12 deficiency - Chronic anemia managed by hematology - Recent laboratory results: hemoglobin 10.5, hematocrit 34.4 - Vitamin B12 deficiency present  Hypertriglyceridemia - Triglyceride levels previously over 800 - Lovaza  prescribed but not received from pharmacy  Hypertension - Well controlled on amlodipine  5 mg daily - Home blood pressure readings within normal range  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 - Chronic low back pain managed by Doctor Eldonna - History of laminectomy - Scheduled for injections - Plans to reduce work hours and considering part-time work due to pain  Tobacco use - Currently smokes three to four cigarettes per day - No use of nicotine  patches or gum - Intends to quit smoking      Health Maintenance Due  Topic Date Due   MAMMOGRAM  10/22/2021   COVID-19  Vaccine (4 - 2024-25 season) 02/25/2023   INFLUENZA VACCINE  01/25/2024    Past Medical History:  Diagnosis Date   Anemia    Arthritis    neck, hips (04/25/2018)   Claustrophobia    Daily headache    no longer a problem   GERD (gastroesophageal reflux disease)    Hiatal hernia    Hyperlipidemia    hx (04/25/2018)   Hypertension    Plantar fasciitis    hx - left foot   Pneumonia    Stroke (HCC) 05/2014   mini stroke (04/25/2018)    Past Surgical History:  Procedure Laterality Date   ABDOMINAL HYSTERECTOMY  12/09   Secondary to fibroids   ARTERY BIOPSY Right 04/10/2018   Procedure: BIOPSY TEMPORAL ARTERY;  Surgeon: Oris Krystal FALCON, MD;  [facility] OR;  Service: Vascular;  Laterality: Right;   CESAREAN SECTION  1986; 1988   COLONOSCOPY     CONDYLOMA EXCISION/FULGURATION N/A 11/30/2023   Procedure: REMOVAL, CONDYLOMA;  Surgeon: Teresa Lonni HERO, MD;  Location: [facility] ORS;  Service: General;  Laterality: N/A;  Excision of hypertrophic anal papilla   FOOT SURGERY Left 03/2017   Bone Spurs Removed   INJECTION, CHEMODENERVATION AGENT N/A 11/30/2023   Procedure: INJECTION, CHEMODENERVATION AGENT;  Surgeon: Teresa Lonni HERO, MD;  Location: [facility] ORS;  Service: General;  Laterality: N/A;   LUMBAR LAMINECTOMY/DECOMPRESSION MICRODISCECTOMY N/A 04/13/2021   Procedure: LUMBAR FOUR- LUMBAR FIVE AND LUMBAR FIVE-SACRAL ONE  DECOMPRESSION;  Surgeon: Barbarann Oneil BROCKS, MD;  Location: [facility] OR;  Service: Orthopedics;  Laterality: N/A;   RADIOLOGY WITH ANESTHESIA N/A 04/26/2018   Procedure:  MRI WITH ANESTHESIA;  Surgeon: Radiologist, Medication, MD;  Location: [facility] OR;  Service: Radiology;  Laterality: N/A;   RADIOLOGY WITH ANESTHESIA N/A 08/24/2019   Procedure: MRI WITH ANESTHESIA CERVICAL SPINE;  Surgeon: Radiologist, Medication, MD;  Location: [facility] OR;  Service: Radiology;  Laterality: N/A;   RADIOLOGY WITH ANESTHESIA N/A 10/02/2019   Procedure: MRI WITH ANESTHESIA CERVICAL WITHOUT CONTRAST;  Surgeon:  Radiologist, Medication, MD;  Location: [facility] OR;  Service: Radiology;  Laterality: N/A;   RECTAL EXAM UNDER ANESTHESIA N/A 11/30/2023   Procedure: EXAM UNDER ANESTHESIA, RECTUM;  Surgeon: Teresa Lonni HERO, MD;  Location: [facility] ORS;  Service: General;  Laterality: N/A;   TUBAL LIGATION  1988    Family History  Problem Relation Age of Onset   Pancreatic cancer Mother 72   Heart disease Mother    Esophageal cancer Father 62   Heart disease Sister    Breast cancer Maternal Aunt 60   Colon cancer Maternal Uncle    Pancreatic cancer Maternal Uncle    Colon cancer Paternal Uncle    Rectal cancer Neg Hx    Stomach cancer Neg Hx     Social History   Socioeconomic History   Marital status: Divorced    Spouse name: Not on file   Number of children: 2   Years of education: Not on file   Highest education level: Not on file  Occupational History   Not on file  Tobacco Use   Smoking status: Some Days    Current packs/day: 0.25    Average packs/day: 0.3 packs/day for 38.0 years (9.5 ttl pk-yrs)    Types: Cigarettes   Smokeless tobacco: Never   Tobacco comments:    1 cigs/day 08/17/2022 km,cma  Vaping Use   Vaping status: Never Used  Substance and Sexual Activity   Alcohol use: Yes    Alcohol/week: 4.0 standard drinks of alcohol    Types: 4 Cans of beer per week   Drug use: Not Currently   Sexual activity: Not Currently    Birth control/protection: Surgical    Comment: Hysterectomy  Other Topics Concern   Not on file  Social History Narrative   10 th grade education.   Social Drivers of Corporate investment banker Strain: Not on file  Food Insecurity: Not on file  Transportation Needs: No Transportation Needs (08/27/2019)   PRAPARE - Administrator, Civil Service (Medical): No    Lack of Transportation (Non-Medical): No  Physical Activity: Not on file  Stress: Not on file  Social Connections: Not on file  Intimate Partner Violence: Not on file    Outpatient  Medications Prior to Visit  Medication Sig Dispense Refill   albuterol  (VENTOLIN  HFA) 108 (90 Base) MCG/ACT inhaler Inhale 2 puffs into the lungs every 6 (six) hours as needed for wheezing or shortness of breath. 8 g 0   amLODipine  (NORVASC ) 5 MG tablet Take 1 tablet (5 mg total) by mouth daily. 90 tablet 1   ferrous sulfate  325 (65 FE) MG tablet Take 325 mg by mouth daily.     folic acid  (FOLVITE ) 1 MG tablet Take 1 tablet (1 mg total) by mouth daily. 90 tablet 0   lidocaine  (XYLOCAINE ) 5 % ointment APPLY 1 APPLICATION TOPICALLY TWICE DAILY AS NEEDED 36 g 0   lipase/protease/amylase (CREON ) 36000 UNITS CPEP capsule Take 3 capsules (108,000 Units total) by mouth 3 (three) times daily with meals AND 2 capsules (72,000 Units total) with snacks. (Patient taking differently: Take  4 capsules by mouth 3 times daily with meals AND 2 capsules with snacks.) 390 capsule 11   meloxicam  (MOBIC ) 15 MG tablet Take 1 tablet (15 mg total) by mouth daily. 30 tablet 0   ondansetron  (ZOFRAN -ODT) 8 MG disintegrating tablet Take 1 tablet (8 mg total) by mouth every 8 (eight) hours as needed for nausea or vomiting. 15 tablet 0   pantoprazole  (PROTONIX ) 40 MG tablet Take 1 tablet (40 mg total) by mouth 2 (two) times daily. Take 30 minutes before breakfast. 60 tablet 2   potassium chloride  (KLOR-CON  M) 10 MEQ tablet Take 1 tablet (10 mEq total) by mouth daily. 30 tablet 2   prochlorperazine  (COMPAZINE ) 10 MG tablet Take 1 tablet (10 mg total) by mouth 2 (two) times daily as needed for nausea or vomiting. 10 tablet 0   omega-3 acid ethyl esters (LOVAZA ) 1 g capsule Take 2 capsules (2 g total) by mouth 2 (two) times daily. 120 capsule 1   cyclobenzaprine  (FLEXERIL ) 10 MG tablet Take 1 tablet (10 mg total) by mouth at bedtime. (Patient not taking: Reported on 01/25/2024) 30 tablet 0   diclofenac  Sodium (VOLTAREN ) 1 % GEL Apply 2 g topically 4 (four) times daily. (Patient not taking: Reported on 01/25/2024) 200 g 1   diltiazem  2 %  GEL Please apply 5 times daily (Patient not taking: Reported on 01/25/2024) 30 g 1   diazepam  (VALIUM ) 5 MG tablet Take one tablet by mouth with light food one hour prior to procedure. 1 tablet 0   methylPREDNISolone  (MEDROL  DOSEPAK) 4 MG TBPK tablet Take as directed with food (Patient not taking: Reported on 11/22/2023) 21 tablet 0   No facility-administered medications prior to visit.    Allergies  Allergen Reactions   Aspirin Hives   Hydromorphone Hcl Hives and Nausea And Vomiting    Can take Norco   Crestor  [Rosuvastatin ]     myalgias   Latex Rash    Review of Systems  Constitutional:  Negative for chills, fever and weight loss.  Respiratory:  Negative for shortness of breath.   Cardiovascular:  Negative for chest pain, palpitations and leg swelling.  Gastrointestinal:  Positive for abdominal pain. Negative for constipation, diarrhea, nausea and vomiting.  Genitourinary:  Negative for dysuria, frequency and urgency.  Musculoskeletal:  Positive for back pain.  Neurological:  Negative for dizziness and focal weakness.       Objective:    Physical Exam Constitutional:      General: She is not in acute distress.    Appearance: She is not ill-appearing.  HENT:     Mouth/Throat:     Mouth: Mucous membranes are moist.     Pharynx: Oropharynx is clear.  Eyes:     Extraocular Movements: Extraocular movements intact.     Conjunctiva/sclera: Conjunctivae normal.  Cardiovascular:     Rate and Rhythm: Normal rate and regular rhythm.  Pulmonary:     Effort: Pulmonary effort is normal.     Breath sounds: Normal breath sounds.  Abdominal:     General: Bowel sounds are normal. There is no distension.     Palpations: Abdomen is soft.     Tenderness: There is no abdominal tenderness. There is no guarding or rebound.  Musculoskeletal:     Cervical back: Normal range of motion and neck supple.     Right lower leg: No edema.     Left lower leg: No edema.  Skin:    General: Skin is  warm and dry.  Neurological:     General: No focal deficit present.     Mental Status: She is alert and oriented to person, place, and time.     Cranial Nerves: No cranial nerve deficit.     Motor: No weakness.     Coordination: Coordination normal.     Gait: Gait normal.  Psychiatric:        Mood and Affect: Mood normal.        Behavior: Behavior normal.        Thought Content: Thought content normal.      BP 126/84   Pulse 95   Temp 97.6 F (36.4 C) (Temporal)   Ht 4' 11 (1.499 m)   Wt 138 lb (62.6 kg)   LMP  (LMP Unknown)   SpO2 96%   BMI 27.87 kg/m  Wt Readings from Last 3 Encounters:  01/25/24 138 lb (62.6 kg)  11/30/23 136 lb (61.7 kg)  11/27/23 136 lb (61.7 kg)       Assessment & Plan:   Problem List Items Addressed This Visit     Essential hypertension, benign (Chronic)   Relevant Medications   omega-3 acid ethyl esters (LOVAZA ) 1 g capsule   Other Relevant Orders   CBC (Completed)   Comprehensive metabolic panel with GFR (Completed)   TSH (Completed)   GERD (Chronic)   Current smoker   Folic acid  deficiency   Relevant Orders   Folate (Completed)   Hypertriglyceridemia - Primary   Relevant Medications   omega-3 acid ethyl esters (LOVAZA ) 1 g capsule   Other Relevant Orders   Lipid panel (Completed)   Hypokalemia   Relevant Orders   Comprehensive metabolic panel with GFR (Completed)   Hypomagnesemia   Relevant Orders   Magnesium  (Completed)   Vitamin B 12 deficiency   Relevant Orders   Vitamin B12 (Completed)   Other Visit Diagnoses       Chronic anemia       Relevant Orders   CBC (Completed)     Vitamin D  deficiency       Relevant Orders   VITAMIN D  25 Hydroxy (Vit-D Deficiency, Fractures) (Completed)      Assessment and Plan Assessment & Plan Abdominal bloating Abdominal bloating since June, associated with increased gas, likely related to dietary habits including consumption of carbonated beverages and gas-producing foods such  as beans and onions. - Advise to reduce intake of carbonated beverages and gas-producing foods - Recommend over-the-counter simethicone  (Gas-X) for symptomatic relief - Advise to avoid drinking through straws to reduce air intake -Reviewed recent labs with patient including normal lipase. - Follow-up here or her GI if not improving  Hypertriglyceridemia Hypertriglyceridemia with levels previously greater than 800 mg/dL. Currently not on Lovaza  due to pharmacy issues. High triglyceride levels pose a risk for pancreatitis, heart attack, and stroke. - Resend prescription for Lovaza  to pharmacy - Advise fasting before next triglyceride level check to obtain accurate results  Hypertension Hypertension is well-controlled on amlodipine  5 mg daily. Blood pressure readings at home are within normal range. - Continue amlodipine  5 mg daily - Encourage periodic home blood pressure monitoring  Nicotine  dependence, cigarettes Currently smoking 3-4 cigarettes per day. Expresses desire to quit smoking and reduce nicotine  dependence. - Prescribe nicotine  patches as a safer alternative to smoking - Recommend a healthy substitute for the hand mouth need. 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 Under the care of Dr. Eldonna. Plans to reduce work hours due to pain and will receive injections on the 20th. - Continue current management under Dr. Eldonna - Plan to reduce work hours to manage pain    I have discontinued [facility]esia P. Feely's methylPREDNISolone  and diazepam . I am also having her start on nicotine . Additionally, I am having her maintain her diclofenac  Sodium, lipase/protease/amylase, diltiazem , albuterol , ondansetron , prochlorperazine , ferrous sulfate , folic acid , potassium chloride , pantoprazole , meloxicam , cyclobenzaprine ,  lidocaine , amLODipine , and omega-3 acid ethyl esters.  Meds ordered this encounter  Medications   omega-3 acid ethyl esters (LOVAZA ) 1 g capsule    Sig: Take 2 capsules (2 g total) by mouth 2 (two) times daily.    Dispense:  120 capsule    Refill:  1   nicotine  (NICODERM CQ  - DOSED IN MG/24 HOURS) 14 mg/24hr patch    Sig: Place 1 patch (14 mg total) onto the skin daily.    Dispense:  28 patch    Refill:  0    Supervising Provider:   ROLLENE NORRIS A [[id number]]

## 2024-01-25 NOTE — Patient Instructions (Addendum)
 Please go downstairs for labs before you leave.  Cut back on carbonated beverages and foods that cause increased gas.  Try over-the-counter Gas-X.  Try the nicotine  patches to help you stop smoking.  If the 14 mg is too much, you can cut them in half.  Start Lovaza  for your triglycerides.  Reduce sugar and processed foods.  Let me know if you have any issues getting the medication from your pharmacy.  I will be in touch with your results

## 2024-01-28 ENCOUNTER — Other Ambulatory Visit: Payer: Self-pay | Admitting: Family Medicine

## 2024-01-28 ENCOUNTER — Telehealth: Payer: Self-pay

## 2024-01-28 ENCOUNTER — Telehealth: Payer: Self-pay | Admitting: Physical Medicine and Rehabilitation

## 2024-01-28 ENCOUNTER — Other Ambulatory Visit: Payer: Self-pay | Admitting: Physical Medicine and Rehabilitation

## 2024-01-28 ENCOUNTER — Ambulatory Visit: Payer: Self-pay | Admitting: Family Medicine

## 2024-01-28 MED ORDER — TRAMADOL HCL 50 MG PO TABS: 50.0000 mg | ORAL_TABLET | Freq: Three times a day (TID) | ORAL | 0 refills | Status: AC | PRN

## 2024-01-28 MED ORDER — VITAMIN D (ERGOCALCIFEROL) 1.25 MG (50000 UNIT) PO CAPS: 50000.0000 [IU] | ORAL_CAPSULE | ORAL | 1 refills | Status: AC

## 2024-01-28 MED ORDER — NICOTINE POLACRILEX 2 MG MT GUM: 2.0000 mg | CHEWING_GUM | OROMUCOSAL | 0 refills | Status: DC | PRN

## 2024-01-28 NOTE — Progress Notes (Signed)
 Please let her know that her magnesium , potassium, vitamin D  and B12 levels are all low.  Is she currently taking potassium chloride  10 mEq every day?  If not, she should take it daily and if she is currently taking it every day then we need to increase the dose to 20 mEq daily.  She should take magnesium  over-the-counter.  I will send in a high-dose once weekly vitamin D  supplement for her to take for the next 12 weeks.  She should most likely get monthly vitamin B12 injections and she can do this at her hematologist or here.   Triglycerides improved but she should take Lovaza  as prescribed. Her anemia and other labs are stable.

## 2024-01-28 NOTE — Telephone Encounter (Signed)
 Copied from CRM #[id number]. Topic: Clinical - Medication Question >> Jan 25, 2024  4:14 PM Drema MATSU wrote: Reason for CRM: Patient stated that the pharmacy advised that patient needs the Gum or Lozenges called to the pharmacy to help her out with cravings.

## 2024-01-28 NOTE — Telephone Encounter (Signed)
 Pt called stating she is in severe pain and asking for sooner appt if possible or if some one cancels. Pt phone number is [phone].

## 2024-01-29 ENCOUNTER — Telehealth: Payer: Self-pay | Admitting: Nurse Practitioner

## 2024-01-29 ENCOUNTER — Other Ambulatory Visit: Payer: Self-pay | Admitting: Family Medicine

## 2024-01-29 MED ORDER — POTASSIUM CHLORIDE CRYS ER 20 MEQ PO TBCR: 20.0000 meq | EXTENDED_RELEASE_TABLET | Freq: Every day | ORAL | 3 refills | Status: DC

## 2024-01-29 NOTE — Telephone Encounter (Signed)
 Inbound call from patient stating she feel as though something is turning in her stomach. States she thought it was gas and her pcp recommended GasX for relief but patient stated she has not had any relief. Requesting a call back to discuss further. Please advise, thank you

## 2024-01-29 NOTE — Telephone Encounter (Signed)
 Called and notified pt, pt was thankful

## 2024-01-29 NOTE — Telephone Encounter (Signed)
 Pt states she was told by her PCP to call and see GI due to abd pain. Pt scheduled to see Dr Abran 02/13/24@11am . Pt aware of appt.

## 2024-01-29 NOTE — Telephone Encounter (Signed)
 Copied from CRM #[id number]. Topic: Clinical - Medical Advice >> Jan 29, 2024  9:09 AM Mesmerise C wrote: Reason for CRM: Patient stated issue with her stomach is no better spoke to Dr. Lendia about it and advised to see her GI doctor but can't be seen until October and inquiring what to do moving forward patient can be reached at  [phone]

## 2024-01-31 ENCOUNTER — Encounter: Payer: Self-pay | Admitting: Emergency Medicine

## 2024-01-31 ENCOUNTER — Other Ambulatory Visit: Payer: Self-pay | Admitting: Family Medicine

## 2024-01-31 ENCOUNTER — Telehealth: Payer: Self-pay | Admitting: Family Medicine

## 2024-01-31 ENCOUNTER — Ambulatory Visit: Payer: Self-pay

## 2024-01-31 ENCOUNTER — Ambulatory Visit: Admitting: Emergency Medicine

## 2024-01-31 VITALS — BP 122/74 | HR 84 | Temp 98.8°F | Ht 59.0 in | Wt 138.0 lb

## 2024-01-31 DIAGNOSIS — M1612 Unilateral primary osteoarthritis, left hip: Secondary | ICD-10-CM

## 2024-01-31 DIAGNOSIS — M25552 Pain in left hip: Secondary | ICD-10-CM

## 2024-01-31 MED ORDER — NICOTINE POLACRILEX 4 MG MT GUM: 4.0000 mg | CHEWING_GUM | OROMUCOSAL | 0 refills | Status: AC | PRN

## 2024-01-31 MED ORDER — METHYLPREDNISOLONE 4 MG PO TBPK: ORAL_TABLET | ORAL | 1 refills | Status: DC

## 2024-01-31 NOTE — Patient Instructions (Signed)
 Hip Pain The hip is the joint between the upper legs and the lower pelvis. The bones, cartilage, tendons, and muscles of your hip joint support your body and allow you to move around. Hip pain can range from a minor ache to severe pain in one or both of your hips. The pain may be felt on the inside of the hip joint near the groin, or on the outside near the buttocks and upper thigh. You may also have swelling or stiffness in your hip area. Follow these instructions at home: Managing pain, stiffness, and swelling     If told, put ice on the painful area. Put ice in a plastic bag. Place a towel between your skin and the bag. Leave the ice on for 20 minutes, 2-3 times a day. If told, apply heat to the affected area as often as told by your health care provider. Use the heat source that your provider recommends, such as a moist heat pack or a heating pad. Place a towel between your skin and the heat source. Leave the heat on for 20-30 minutes. If your skin turns bright red, remove the ice or heat right away to prevent skin damage. The risk of damage is higher if you cannot feel pain, heat, or cold. Activity Do exercises as told by your provider. Avoid activities that cause pain. General instructions  Take over-the-counter and prescription medicines only as told by your provider. Keep a journal of your symptoms. Write down: How often you have hip pain. The location of your pain. What the pain feels like. What makes the pain worse. Sleep with a pillow between your legs on your most comfortable side. Keep all follow-up visits. Your provider will monitor your pain and activity. Contact a health care provider if: You cannot put weight on your leg. Your pain or swelling gets worse after a week. It gets harder to walk. You have a fever. Get help right away if: You fall. You have a sudden increase in pain and swelling in your hip. Your hip is red or swollen or very tender to touch. This  information is not intended to replace advice given to you by your health care provider. Make sure you discuss any questions you have with your health care provider. Document Revised: 02/14/2022 Document Reviewed: 02/14/2022 Elsevier Patient Education  2024 [employer].

## 2024-01-31 NOTE — Assessment & Plan Note (Signed)
 Creating chronic left hip pain Recommend starting Medrol  Dosepak Continue tramadol  and meloxicam 

## 2024-01-31 NOTE — Telephone Encounter (Signed)
 Copied from CRM [id number]. Topic: Clinical - Prescription Issue >> Jan 31, 2024 10:45 AM [name] wrote: Reason for CRM: Pharmacy stated that prescription for nicotine  polacrilex (NICORETTE ) 2 MG gum has to be sent in a 4 mg for patients insurance to cover it.  [id number] - [place] ([place]), [place] - [address]  Phone: [phone] Fax: [phone]

## 2024-01-31 NOTE — Assessment & Plan Note (Signed)
 Pain management discussed Secondary to osteoarthritis Continue tramadol  and meloxicam  Start Medrol  Dosepak May need referral to orthopedist

## 2024-01-31 NOTE — Progress Notes (Signed)
 [name] 63 y.o.   Chief Complaint  Patient presents with   Hip Pain    Patient here for left hip pain. Pain started Monday she says she woke up to the pain not from an injury. She says she has swelling in her left hip and it is hard for her to walk and work because of the pain. Vickie NP sent in tramadol  and states it didn't help at all she also has meloxicam  at night to help her sleep     HISTORY OF PRESENT ILLNESS: This is a 63 y.o. female complaining of pain to the left hip area that started last Monday.  Denies injury. Has been taking tramadol  and meloxicam  but still having pain No other associated symptoms No other complaints or medical concerns today.  Hip Pain      Prior to Admission medications   Medication Sig Start Date End Date Taking? Authorizing Provider  albuterol  (VENTOLIN  HFA) 108 (90 Base) MCG/ACT inhaler Inhale 2 puffs into the lungs every 6 (six) hours as needed for wheezing or shortness of breath. 04/25/23  Yes Elnor Lauraine BRAVO, NP  amLODipine  (NORVASC ) 5 MG tablet Take 1 tablet (5 mg total) by mouth daily. 01/21/24  Yes Henson, Vickie L, NP-C  ferrous sulfate  325 (65 FE) MG tablet Take 325 mg by mouth daily.   Yes [provider]  folic acid  (FOLVITE ) 1 MG tablet Take 1 tablet (1 mg total) by mouth daily. 12/06/23  Yes Henson, Vickie L, NP-C  lidocaine  (XYLOCAINE ) 5 % ointment APPLY 1 APPLICATION TOPICALLY TWICE DAILY AS NEEDED 01/14/24  Yes Kennedy-Smith, Colleen M, NP  lipase/protease/amylase (CREON ) 36000 UNITS CPEP capsule Take 3 capsules (108,000 Units total) by mouth 3 (three) times daily with meals AND 2 capsules (72,000 Units total) with snacks. Patient taking differently: Take 4 capsules by mouth 3 times daily with meals AND 2 capsules with snacks. 10/05/22  Yes Abran Norleen SAILOR, MD  meloxicam  (MOBIC ) 15 MG tablet Take 1 tablet (15 mg total) by mouth daily. 01/14/24 01/13/25 Yes Williams, Megan E, NP  methylPREDNISolone  (MEDROL  DOSEPAK) 4 MG TBPK  tablet Sig as indicated 01/31/24  Yes Kyrie Bun, Emil Schanz, MD  nicotine  (NICODERM CQ  - DOSED IN MG/24 HOURS) 14 mg/24hr patch Place 1 patch (14 mg total) onto the skin daily. 01/25/24  Yes Henson, Vickie L, NP-C  nicotine  polacrilex (NICORETTE ) 4 MG gum Take 1 each (4 mg total) by mouth as needed for smoking cessation. 01/31/24  Yes Henson, Vickie L, NP-C  omega-3 acid ethyl esters (LOVAZA ) 1 g capsule Take 2 capsules (2 g total) by mouth 2 (two) times daily. 01/25/24  Yes Henson, Vickie L, NP-C  ondansetron  (ZOFRAN -ODT) 8 MG disintegrating tablet Take 1 tablet (8 mg total) by mouth every 8 (eight) hours as needed for nausea or vomiting. 08/06/23  Yes Amyot, Jenkins Lesches, NP  pantoprazole  (PROTONIX ) 40 MG tablet Take 1 tablet (40 mg total) by mouth 2 (two) times daily. Take 30 minutes before breakfast. 12/24/23  Yes Kennedy-Smith, Colleen M, NP  potassium chloride  SA (KLOR-CON  M) 20 MEQ tablet Take 1 tablet (20 mEq total) by mouth daily. 01/29/24  Yes Henson, Vickie L, NP-C  prochlorperazine  (COMPAZINE ) 10 MG tablet Take 1 tablet (10 mg total) by mouth 2 (two) times daily as needed for nausea or vomiting. 08/09/23  Yes Roemhildt, Lorin T, PA-C  traMADol  (ULTRAM ) 50 MG tablet Take 1 tablet (50 mg total) by mouth every 8 (eight) hours as needed. 01/28/24  Yes Trudy,  Megan E, NP  Vitamin D , Ergocalciferol , (DRISDOL ) 1.25 MG (50000 UNIT) CAPS capsule Take 1 capsule (50,000 Units total) by mouth every 7 (seven) days. 01/28/24  Yes Henson, Vickie L, NP-C  cyclobenzaprine  (FLEXERIL ) 10 MG tablet Take 1 tablet (10 mg total) by mouth at bedtime. Patient not taking: Reported on 01/31/2024 01/14/24   Williams, Megan E, NP  diclofenac  Sodium (VOLTAREN ) 1 % GEL Apply 2 g topically 4 (four) times daily. Patient not taking: Reported on 01/31/2024 03/15/22   Lendia Nordmann L, NP-C  diltiazem  2 % GEL Please apply 5 times daily Patient not taking: Reported on 01/31/2024 10/05/22   Abran Norleen SAILOR, MD  Omega 3 1000 MG CAPS Take 2 g by mouth  daily. 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   Cough due to ACE inhibitor 05/16/2022   Myalgia 05/16/2022   Acute bronchitis 05/16/2022   Hypomagnesemia 03/19/2022   Vitamin B 12 deficiency 03/19/2022   Low TSH level 03/19/2022   Right elbow pain 03/19/2022   Encounter for screening mammogram for malignant neoplasm of breast 03/19/2022   History of hypertension 12/13/2021   History of smoking 12/13/2021   Elevated blood pressure reading 12/13/2021   Contusion of right knee 12/13/2021   Fall 12/13/2021   Muscle cramps 12/04/2021   Elevated liver enzymes 12/04/2021   Paresthesias 12/04/2021   Elevated LFTs 12/04/2021   Anemia 10/20/2021   Chronic foot pain 10/20/2021   Folic acid  deficiency 09/19/2021   Chronic pain syndrome 09/07/2021   Status post lumbar spine surgery for decompression of spinal cord 05/24/2021   Dyspepsia 05/23/2021   History of gastroesophageal reflux (GERD) 05/23/2021   History of lumbar laminectomy for spinal cord decompression 04/26/2021   Lumbar stenosis 04/13/2021   Tension headache 12/22/2020   Low back pain 10/20/2020   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/28/2020   Hypokalemia 12/17/2019   Chronic diarrhea 09/14/2017   History of stroke 06/12/2014   Cervical radiculitis 02/12/2014   Essential hypertension, benign 01/24/2010   Microcytic anemia 01/11/2010   Hyperlipemia 05/28/2007   Current smoker 05/28/2007   GERD 05/28/2007    Past Medical History:   Diagnosis Date   Anemia    Arthritis    neck, hips (04/25/2018)   Claustrophobia    Daily headache    no longer a problem   GERD (gastroesophageal reflux disease)    Hiatal hernia    Hyperlipidemia    hx (04/25/2018)   Hypertension    Plantar fasciitis    hx - left foot   Pneumonia    Stroke (HCC) 05/2014   mini stroke (04/25/2018)    Past Surgical History:  Procedure Laterality Date   ABDOMINAL HYSTERECTOMY  12/09   Secondary to fibroids   ARTERY BIOPSY Right 04/10/2018   Procedure: BIOPSY TEMPORAL ARTERY;  Surgeon: Oris Krystal FALCON, MD;  Location: [facility] OR;  Service: Vascular;  Laterality: Right;   CESAREAN SECTION  1986; 1988   COLONOSCOPY  CONDYLOMA EXCISION/FULGURATION N/A 11/30/2023   Procedure: REMOVAL, CONDYLOMA;  Surgeon: Teresa Lonni HERO, MD;  Location: [facility] ORS;  Service: General;  Laterality: N/A;  Excision of hypertrophic anal papilla   FOOT SURGERY Left 03/2017   Bone Spurs Removed   INJECTION, CHEMODENERVATION AGENT N/A 11/30/2023   Procedure: INJECTION, CHEMODENERVATION AGENT;  Surgeon: Teresa Lonni HERO, MD;  Location: [facility];  Service: General;  Laterality: N/A;   LUMBAR LAMINECTOMY/DECOMPRESSION MICRODISCECTOMY N/A 04/13/2021   Procedure: LUMBAR FOUR- LUMBAR FIVE AND LUMBAR FIVE-SACRAL ONE  DECOMPRESSION;  Surgeon: Barbarann Oneil BROCKS, MD;  Location: [facility] OR;  Service: Orthopedics;  Laterality: N/A;   RADIOLOGY WITH ANESTHESIA N/A 04/26/2018   Procedure: MRI WITH ANESTHESIA;  Surgeon: Radiologist, Medication, MD;  Location: [facility] OR;  Service: Radiology;  Laterality: N/A;   RADIOLOGY WITH ANESTHESIA N/A 08/24/2019   Procedure: MRI WITH ANESTHESIA CERVICAL SPINE;  Surgeon: Radiologist, Medication, MD;  Location: [facility] OR;  Service: Radiology;  Laterality: N/A;   RADIOLOGY WITH ANESTHESIA N/A 10/02/2019   Procedure: MRI WITH ANESTHESIA CERVICAL WITHOUT CONTRAST;  Surgeon: Radiologist, Medication, MD;  Location: [facility] OR;  Service: Radiology;  Laterality: N/A;   RECTAL EXAM  UNDER ANESTHESIA N/A 11/30/2023   Procedure: EXAM UNDER ANESTHESIA, RECTUM;  Surgeon: Teresa Lonni HERO, MD;  Location: [facility];  Service: General;  Laterality: N/A;   TUBAL LIGATION  1988    Social History   Socioeconomic History   Marital status: Divorced    Spouse name: Not on file   Number of children: 2   Years of education: Not on file   Highest education level: Not on file  Occupational History   Not on file  Tobacco Use   Smoking status: Some Days    Current packs/day: 0.25    Average packs/day: 0.3 packs/day for 38.0 years (9.5 ttl pk-yrs)    Types: Cigarettes   Smokeless tobacco: Never   Tobacco comments:    1 cigs/day 08/17/2022 km,cma  Vaping Use   Vaping status: Never Used  Substance and Sexual Activity   Alcohol use: Yes    Alcohol/week: 4.0 standard drinks of alcohol    Types: 4 Cans of beer per week   Drug use: Not Currently   Sexual activity: Not Currently    Birth control/protection: Surgical    Comment: Hysterectomy  Other Topics Concern   Not on file  Social History Narrative   10 th grade education.   Social Drivers of Corporate investment banker Strain: Not on file  Food Insecurity: Not on file  Transportation Needs: No Transportation Needs (08/27/2019)   PRAPARE - Administrator, Civil Service (Medical): No    Lack of Transportation (Non-Medical): No  Physical Activity: Not on file  Stress: Not on file  Social Connections: Not on file  Intimate Partner Violence: Not on file    Family History  Problem Relation Age of Onset   Pancreatic cancer Mother 83   Heart disease Mother    Esophageal cancer Father 23   Heart disease Sister    Breast cancer Maternal Aunt 60   Colon cancer Maternal Uncle    Pancreatic cancer Maternal Uncle    Colon cancer Paternal Uncle    Rectal cancer Neg Hx    Stomach cancer Neg Hx      Review of Systems  Constitutional: Negative.  Negative for chills and fever.  HENT: Negative.  Negative for  congestion and sore throat.   Respiratory: Negative.  Negative for cough and  shortness of breath.   Cardiovascular: Negative.  Negative for chest pain and palpitations.  Gastrointestinal:  Negative for abdominal pain, diarrhea, nausea and vomiting.  Genitourinary: Negative.  Negative for dysuria and hematuria.  Skin: Negative.  Negative for rash.  Neurological: Negative.  Negative for dizziness and headaches.  All other systems reviewed and are negative.   Vitals:   01/31/24 1550  BP: 122/74  Pulse: 84  Temp: 98.8 F (37.1 C)  SpO2: 99%    Physical Exam Vitals reviewed.  Constitutional:      Appearance: Normal appearance.  HENT:     Head: Normocephalic.  Eyes:     Extraocular Movements: Extraocular movements intact.  Cardiovascular:     Rate and Rhythm: Normal rate.  Pulmonary:     Effort: Pulmonary effort is normal.  Abdominal:     Palpations: Abdomen is soft.     Tenderness: There is no abdominal tenderness.  Musculoskeletal:     Comments: Pain to hip area with painful range of motion  Skin:    General: Skin is warm and dry.     Capillary Refill: Capillary refill takes less than 2 seconds.  Neurological:     General: No focal deficit present.     Mental Status: She is alert and oriented to person, place, and time.  Psychiatric:        Mood and Affect: Mood normal.        Behavior: Behavior normal.      ASSESSMENT & PLAN: I personally spent a total of 32 minutes minutes in the care of the patient today including preparing to see the patient, getting/reviewing separately obtained history, performing a medically appropriate exam/evaluation, counseling and educating, documenting clinical information in the EHR, independently interpreting results, communicating results, and coordinating care.  Pain management discussed and all medications reviewed.  Problem List Items Addressed This Visit       Musculoskeletal and Integument   Primary osteoarthritis of left hip    Creating chronic left hip pain Recommend starting Medrol  Dosepak Continue tramadol  and meloxicam       Relevant Medications   methylPREDNISolone  (MEDROL  DOSEPAK) 4 MG TBPK tablet     Other   Left hip pain - Primary   Pain management discussed Secondary to osteoarthritis Continue tramadol  and meloxicam  Start Medrol  Dosepak May need referral to orthopedist      Relevant Medications   methylPREDNISolone  (MEDROL  DOSEPAK) 4 MG TBPK tablet   Patient Instructions  Hip Pain The hip is the joint between the upper legs and the lower pelvis. The bones, cartilage, tendons, and muscles of your hip joint support your body and allow you to move around. Hip pain can range from a minor ache to severe pain in one or both of your hips. The pain may be felt on the inside of the hip joint near the groin, or on the outside near the buttocks and upper thigh. You may also have swelling or stiffness in your hip area. Follow these instructions at home: Managing pain, stiffness, and swelling     If told, put ice on the painful area. Put ice in a plastic bag. Place a towel between your skin and the bag. Leave the ice on for 20 minutes, 2-3 times a day. If told, apply heat to the affected area as often as told by your health care provider. Use the heat source that your provider recommends, such as a moist heat pack or a heating pad. Place a towel between your skin and the  heat source. Leave the heat on for 20-30 minutes. If your skin turns bright red, remove the ice or heat right away to prevent skin damage. The risk of damage is higher if you cannot feel pain, heat, or cold. Activity Do exercises as told by your provider. Avoid activities that cause pain. General instructions  Take over-the-counter and prescription medicines only as told by your provider. Keep a journal of your symptoms. Write down: How often you have hip pain. The location of your pain. What the pain feels like. What makes the  pain worse. Sleep with a pillow between your legs on your most comfortable side. Keep all follow-up visits. Your provider will monitor your pain and activity. Contact a health care provider if: You cannot put weight on your leg. Your pain or swelling gets worse after a week. It gets harder to walk. You have a fever. Get help right away if: You fall. You have a sudden increase in pain and swelling in your hip. Your hip is red or swollen or very tender to touch. This information is not intended to replace advice given to you by your health care provider. Make sure you discuss any questions you have with your health care provider. Document Revised: 02/14/2022 Document Reviewed: 02/14/2022 Elsevier Patient Education  2024 Elsevier Inc.    Emil Schaumann, MD [facility]

## 2024-01-31 NOTE — Telephone Encounter (Signed)
 FYI Only or Action Required?: FYI only for provider.  Patient was last seen in primary care on 01/25/2024 by Lendia Boby CROME, NP-C.  Called Nurse Triage reporting Hip Pain.  Symptoms began ongoing.  Interventions attempted: OTC medications: Tylenol , Prescription medications: Tramadol , and Rest, hydration, or home remedies.  Symptoms are: gradually worsening.  Triage Disposition: See HCP Within 4 Hours (Or PCP Triage)  Patient/caregiver understands and will follow disposition?: Yes  Copied from CRM #[id number]. Topic: Clinical - Red Word Triage >> Jan 31, 2024 10:48 AM Burnard DEL wrote: Red Word that prompted transfer to Nurse Triage: Knot on left  hip ,pain running  down back  leg to feet Reason for Disposition  [1] SEVERE pain (e.g., excruciating, unable to do any normal activities) AND [2] not improved after 2 hours of pain medicine  Answer Assessment - Initial Assessment Questions 1. LOCATION and RADIATION: Where is the pain located? Does the pain spread (shoot) anywhere else?     Left 2. QUALITY: What does the pain feel like?  (e.g., sharp, dull, aching, burning)     shooting 3. SEVERITY: How bad is the pain? What does it keep you from doing?   (Scale 1-10; or mild, moderate, severe)     A lot still working. Trouble sitting, walking worse.  4. ONSET: When did the pain start? Does it come and go, or is it there all the time?     Several day 5. WORK OR EXERCISE: Has there been any recent work or exercise that involved this part of the body?      Denies  6. CAUSE: What do you think is causing the hip pain?      Arthritis 7. AGGRAVATING FACTORS: What makes the hip pain worse? (e.g., walking, climbing stairs, running)     Ambulating, sitting  8. OTHER SYMPTOMS: Do you have any other symptoms? (e.g., back pain, pain shooting down leg,  fever, rash)     Pain shooting down leg to foot.   Additional info:  Had MRI which showed Lumbar arthritis. She feels she has  aggrevated the arthritis and this has caused a knot to form at her left hip that is painful and radiates down left leg to her toes. She has a prescription for tramadol  which is helpful but she does not take this during work, using tylenol  at work but that is not helpful. Due for injections. Requesting evaluation of new radiating pain  Protocols used: Hip Pain-A-AH

## 2024-02-01 ENCOUNTER — Ambulatory Visit

## 2024-02-01 DIAGNOSIS — E538 Deficiency of other specified B group vitamins: Secondary | ICD-10-CM

## 2024-02-01 MED ORDER — CYANOCOBALAMIN 1000 MCG/ML IJ SOLN
1000.0000 ug | Freq: Once | INTRAMUSCULAR | Status: AC
  Administered 2024-02-01: 1000 ug via INTRAMUSCULAR

## 2024-02-01 NOTE — Progress Notes (Signed)
 Patient here for monthly B12 injection. Patient tolerated well with no complications. Next monthly injection was scheduled with patient

## 2024-02-04 ENCOUNTER — Ambulatory Visit: Payer: Self-pay

## 2024-02-04 ENCOUNTER — Telehealth: Payer: Self-pay | Admitting: Family Medicine

## 2024-02-04 NOTE — Telephone Encounter (Signed)
 Copied from CRM #[id number]. Topic: Clinical - Red Word Triage >> Feb 04, 2024 11:13 AM Mesmerise C wrote: [facility] that prompted transfer to Nurse Triage: Patient states her left foot has been swelling since Friday hard to put shoe on and when she tried to turn her foot it's painful Answer Assessment - Initial Assessment Questions Pt started Prednisone  Friday. Pt was having foot pain but swelling starting after starting prednisone .  Pt has throbbing pain in left foot.      1. ONSET: When did the swelling start? (e.g., minutes, hours, days)     Friday 2. LOCATION: What part of the leg is swollen?  Are both legs swollen or just one leg?     Left foot 3. SEVERITY: How bad is the swelling? (e.g., localized; mild, moderate, severe)     Mild-moderate 4. REDNESS: Is there redness or signs of infection?     denies 5. PAIN: Is the swelling painful to touch? If Yes, ask: How painful is it?   (Scale 1-10; mild, moderate or severe)     10 6. FEVER: Do you have a fever? If Yes, ask: What is it, how was it measured, and when did it start?      na 7. CAUSE: What do you think is causing the leg swelling?     Not sure 10. OTHER SYMPTOMS: Do you have any other symptoms? (e.g., chest pain, difficulty breathing)       Denies  Protocols used: Leg Swelling and Edema-A-AH

## 2024-02-04 NOTE — Telephone Encounter (Signed)
 FYI Only or Action Required?: Action required by provider: request for appointment.  Patient was last seen in primary care on 01/31/2024 by Purcell Emil Schanz, MD.  Called Nurse Triage reporting Foot Swelling.  Symptoms began several days ago.  Interventions attempted: Prescription medications: prednisone .  Symptoms are: gradually worsening.  Triage Disposition: See Physician Within 24 Hours  Patient/caregiver understands and will follow disposition?: YesReason for Disposition  [1] MODERATE leg swelling (e.g., swelling extends up to knees) AND [2] new-onset or getting worse  Protocols used: Leg Swelling and Edema-A-AH

## 2024-02-04 NOTE — Telephone Encounter (Unsigned)
 Copied from CRM [id number]. Topic: Clinical - Prescription Issue >> Feb 04, 2024 11:08 AM Mesmerise C wrote: Reason for CRM: Patient stated prescription has to be resent for nicotine  patches for 4 mg insurance will not cover for 2mg  patient states pharmacy hasn't received it

## 2024-02-05 ENCOUNTER — Ambulatory Visit: Payer: Self-pay | Admitting: Emergency Medicine

## 2024-02-05 ENCOUNTER — Encounter: Payer: Self-pay | Admitting: Emergency Medicine

## 2024-02-05 ENCOUNTER — Ambulatory Visit: Admitting: Emergency Medicine

## 2024-02-05 ENCOUNTER — Ambulatory Visit

## 2024-02-05 VITALS — BP 138/82 | HR 83 | Temp 99.0°F | Ht 59.0 in | Wt 139.0 lb

## 2024-02-05 DIAGNOSIS — M79672 Pain in left foot: Secondary | ICD-10-CM

## 2024-02-05 NOTE — Assessment & Plan Note (Signed)
 Clinically stable.  No red flag signs or symptoms Unremarkable physical findings Recommend x-ray today Will review images when available Pain management discussed Has history of chronic pain May need referral to podiatrist.

## 2024-02-05 NOTE — Patient Instructions (Signed)
 Foot Pain Many things can cause foot pain. Common causes include injuries to the foot. The injuries include sprains or broken bones, or injuries that affect the nerves in the feet. Other causes of foot pain include arthritis, blisters, and bunions. To know what causes your foot pain, your health care provider will take a detailed history of your symptoms. They will also do a physical exam as well as imaging tests, such as X-ray or MRI. Follow these instructions at home: Managing pain, stiffness, and swelling  If told, put ice on the painful area. Put ice in a plastic bag. Place a towel between your skin and the bag. Leave the ice on for 20 minutes, 2-3 times a day. If your skin turns bright red, remove the ice right away to prevent skin damage. The risk of damage is higher if you cannot feel pain, heat, or cold. Activity Do not stand or walk for long periods. Do stretches to relieve foot pain and stiffness as told by your provider. Do not lift anything that is heavier than 10 lb (4.5 kg), or the limit that you are told, until your provider says that it is safe. Lifting a lot of weight can put added pressure on your feet. Return to your normal activities as told by your provider. Ask your provider what activities are safe for you. Lifestyle Wear comfortable, supportive shoes that fit you well. Do not wear high heels. Keep your feet clean and dry. General instructions Take over-the-counter and prescription medicines only as told by your provider. Rub your foot gently. Pay attention to any changes in your symptoms. Let your provider know if symptoms become worse. Keep all follow-up visits. Your provider will want to monitor your progress. Contact a health care provider if: Your pain does not get better after a few days of treatment at home. Your pain gets worse. You cannot stand on your foot. Your foot or toes are swollen. Your foot is numb or tingling. Get help right away if: Your foot  or toes turn white or blue. You have warmth and redness along your foot. This information is not intended to replace advice given to you by your health care provider. Make sure you discuss any questions you have with your health care provider. Document Revised: 07/06/2022 Document Reviewed: 03/14/2022 Elsevier Patient Education  2024 [employer].

## 2024-02-05 NOTE — Progress Notes (Signed)
 [name] 63 y.o.   Chief Complaint  Patient presents with   Foot Swelling    Patient here for Left foot swelling, she was here last Friday for the hip pain she mentions that is a little better. Started Saturday, she states she did not injury it, and woke up with it swollen and painful, says it hurts to walk on or standing for a long time . She took ibuprofen  for the pain but its not helping.      HISTORY OF PRESENT ILLNESS: This is a 63 y.o. female complaining of left foot pain that started 5 days ago Was seen by me last week for left hip pain.  Started on Medrol  Dosepak Has also been taking meloxicam  and tramadol .  Hip much improved. Denies injury to left foot.  No history of gout. Pain is sharp and localized to dorsum of foot. No other associated symptoms. No other complaints or medical concerns today.  HPI   Prior to Admission medications   Medication Sig Start Date End Date Taking? Authorizing Provider  albuterol  (VENTOLIN  HFA) 108 (90 Base) MCG/ACT inhaler Inhale 2 puffs into the lungs every 6 (six) hours as needed for wheezing or shortness of breath. 04/25/23  Yes Elnor Lauraine BRAVO, NP  amLODipine  (NORVASC ) 5 MG tablet Take 1 tablet (5 mg total) by mouth daily. 01/21/24  Yes Henson, Vickie L, NP-C  ferrous sulfate  325 (65 FE) MG tablet Take 325 mg by mouth daily.   Yes [provider]  folic acid  (FOLVITE ) 1 MG tablet Take 1 tablet (1 mg total) by mouth daily. 12/06/23  Yes Henson, Vickie L, NP-C  lidocaine  (XYLOCAINE ) 5 % ointment APPLY 1 APPLICATION TOPICALLY TWICE DAILY AS NEEDED 01/14/24  Yes Kennedy-Smith, Colleen M, NP  lipase/protease/amylase (CREON ) 36000 UNITS CPEP capsule Take 3 capsules (108,000 Units total) by mouth 3 (three) times daily with meals AND 2 capsules (72,000 Units total) with snacks. Patient taking differently: Take 4 capsules by mouth 3 times daily with meals AND 2 capsules with snacks. 10/05/22  Yes Abran Norleen SAILOR, MD  meloxicam  (MOBIC ) 15 MG  tablet Take 1 tablet (15 mg total) by mouth daily. 01/14/24 01/13/25 Yes Williams, Megan E, NP  methylPREDNISolone  (MEDROL  DOSEPAK) 4 MG TBPK tablet Sig as indicated 01/31/24  Yes Kasiya Burck, Emil Schanz, MD  nicotine  (NICODERM CQ  - DOSED IN MG/24 HOURS) 14 mg/24hr patch Place 1 patch (14 mg total) onto the skin daily. 01/25/24  Yes Henson, Vickie L, NP-C  nicotine  polacrilex (NICORETTE ) 4 MG gum Take 1 each (4 mg total) by mouth as needed for smoking cessation. 01/31/24  Yes Henson, Vickie L, NP-C  omega-3 acid ethyl esters (LOVAZA ) 1 g capsule Take 2 capsules (2 g total) by mouth 2 (two) times daily. 01/25/24  Yes Henson, Vickie L, NP-C  ondansetron  (ZOFRAN -ODT) 8 MG disintegrating tablet Take 1 tablet (8 mg total) by mouth every 8 (eight) hours as needed for nausea or vomiting. 08/06/23  Yes Amyot, Jenkins Lesches, NP  pantoprazole  (PROTONIX ) 40 MG tablet Take 1 tablet (40 mg total) by mouth 2 (two) times daily. Take 30 minutes before breakfast. 12/24/23  Yes Kennedy-Smith, Colleen M, NP  potassium chloride  SA (KLOR-CON  M) 20 MEQ tablet Take 1 tablet (20 mEq total) by mouth daily. 01/29/24  Yes Henson, Vickie L, NP-C  prochlorperazine  (COMPAZINE ) 10 MG tablet Take 1 tablet (10 mg total) by mouth 2 (two) times daily as needed for nausea or vomiting. 08/09/23  Yes Roemhildt, Lorin T, PA-C  traMADol  (ULTRAM ) 50 MG tablet Take 1 tablet (50 mg total) by mouth every 8 (eight) hours as needed. 01/28/24  Yes Williams, Megan E, NP  Vitamin D , Ergocalciferol , (DRISDOL ) 1.25 MG (50000 UNIT) CAPS capsule Take 1 capsule (50,000 Units total) by mouth every 7 (seven) days. 01/28/24  Yes Henson, Vickie L, NP-C  cyclobenzaprine  (FLEXERIL ) 10 MG tablet Take 1 tablet (10 mg total) by mouth at bedtime. Patient not taking: Reported on 02/05/2024 01/14/24   Williams, Megan E, NP  diclofenac  Sodium (VOLTAREN ) 1 % GEL Apply 2 g topically 4 (four) times daily. Patient not taking: Reported on 02/05/2024 03/15/22   Lendia Nordmann L, NP-C  diltiazem  2 %  GEL Please apply 5 times daily Patient not taking: Reported on 02/05/2024 10/05/22   Abran Norleen SAILOR, MD  Omega 3 1000 MG CAPS Take 2 g by mouth daily. 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   Cough due to ACE inhibitor 05/16/2022   Myalgia 05/16/2022   Acute bronchitis 05/16/2022   Hypomagnesemia 03/19/2022   Vitamin B 12 deficiency 03/19/2022   Low TSH level 03/19/2022   Right elbow pain 03/19/2022   Encounter for screening mammogram for malignant neoplasm of breast 03/19/2022   History of hypertension 12/13/2021   History of smoking 12/13/2021   Elevated blood pressure reading 12/13/2021   Contusion of right knee 12/13/2021   Fall 12/13/2021   Muscle cramps 12/04/2021   Elevated liver enzymes 12/04/2021   Paresthesias 12/04/2021   Elevated LFTs 12/04/2021   Anemia 10/20/2021   Chronic foot pain 10/20/2021   Folic acid  deficiency 09/19/2021   Chronic pain syndrome 09/07/2021   Status post lumbar spine surgery for decompression of spinal cord 05/24/2021   Dyspepsia 05/23/2021   History of gastroesophageal reflux (GERD) 05/23/2021   History of lumbar laminectomy for spinal cord decompression 04/26/2021   Lumbar stenosis 04/13/2021   Tension headache 12/22/2020   Low back pain 10/20/2020   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/28/2020   Hypokalemia 12/17/2019   Chronic diarrhea 09/14/2017   History of stroke  06/12/2014   Cervical radiculitis 02/12/2014   Essential hypertension, benign 01/24/2010   Microcytic anemia 01/11/2010   Hyperlipemia 05/28/2007   Current smoker 05/28/2007   GERD 05/28/2007    Past Medical History:  Diagnosis Date   Anemia    Arthritis    neck, hips (04/25/2018)   Claustrophobia    Daily headache    no longer a problem   GERD (gastroesophageal reflux disease)    Hiatal hernia    Hyperlipidemia    hx (04/25/2018)   Hypertension    Plantar fasciitis    hx - left foot   Pneumonia    Stroke (HCC) 05/2014   mini stroke (04/25/2018)    Past Surgical History:  Procedure Laterality Date   ABDOMINAL HYSTERECTOMY  12/09   Secondary to fibroids  ARTERY BIOPSY Right 04/10/2018   Procedure: BIOPSY TEMPORAL ARTERY;  Surgeon: Oris Krystal FALCON, MD;  Location: [facility] OR;  Service: Vascular;  Laterality: Right;   CESAREAN SECTION  1986; 1988   COLONOSCOPY     CONDYLOMA EXCISION/FULGURATION N/A 11/30/2023   Procedure: REMOVAL, CONDYLOMA;  Surgeon: Teresa Lonni HERO, MD;  Location: [facility] ORS;  Service: General;  Laterality: N/A;  Excision of hypertrophic anal papilla   FOOT SURGERY Left 03/2017   Bone Spurs Removed   INJECTION, CHEMODENERVATION AGENT N/A 11/30/2023   Procedure: INJECTION, CHEMODENERVATION AGENT;  Surgeon: Teresa Lonni HERO, MD;  Location: [facility] ORS;  Service: General;  Laterality: N/A;   LUMBAR LAMINECTOMY/DECOMPRESSION MICRODISCECTOMY N/A 04/13/2021   Procedure: LUMBAR FOUR- LUMBAR FIVE AND LUMBAR FIVE-SACRAL ONE  DECOMPRESSION;  Surgeon: Barbarann Oneil BROCKS, MD;  Location: [facility] OR;  Service: Orthopedics;  Laterality: N/A;   RADIOLOGY WITH ANESTHESIA N/A 04/26/2018   Procedure: MRI WITH ANESTHESIA;  Surgeon: Radiologist, Medication, MD;  Location: [facility] OR;  Service: Radiology;  Laterality: N/A;   RADIOLOGY WITH ANESTHESIA N/A 08/24/2019   Procedure: MRI WITH ANESTHESIA CERVICAL SPINE;  Surgeon: Radiologist, Medication, MD;  Location: [facility] OR;  Service: Radiology;   Laterality: N/A;   RADIOLOGY WITH ANESTHESIA N/A 10/02/2019   Procedure: MRI WITH ANESTHESIA CERVICAL WITHOUT CONTRAST;  Surgeon: Radiologist, Medication, MD;  Location: [facility] OR;  Service: Radiology;  Laterality: N/A;   RECTAL EXAM UNDER ANESTHESIA N/A 11/30/2023   Procedure: EXAM UNDER ANESTHESIA, RECTUM;  Surgeon: Teresa Lonni HERO, MD;  Location: [facility] ORS;  Service: General;  Laterality: N/A;   TUBAL LIGATION  1988    Social History   Socioeconomic History   Marital status: Divorced    Spouse name: Not on file   Number of children: 2   Years of education: Not on file   Highest education level: Not on file  Occupational History   Not on file  Tobacco Use   Smoking status: Some Days    Current packs/day: 0.25    Average packs/day: 0.3 packs/day for 38.0 years (9.5 ttl pk-yrs)    Types: Cigarettes   Smokeless tobacco: Never   Tobacco comments:    1 cigs/day 08/17/2022 km,cma  Vaping Use   Vaping status: Never Used  Substance and Sexual Activity   Alcohol use: Yes    Alcohol/week: 4.0 standard drinks of alcohol    Types: 4 Cans of beer per week   Drug use: Not Currently   Sexual activity: Not Currently    Birth control/protection: Surgical    Comment: Hysterectomy  Other Topics Concern   Not on file  Social History Narrative   10 th grade education.   Social Drivers of Corporate investment banker Strain: Not on file  Food Insecurity: Not on file  Transportation Needs: No Transportation Needs (08/27/2019)   PRAPARE - Administrator, Civil Service (Medical): No    Lack of Transportation (Non-Medical): No  Physical Activity: Not on file  Stress: Not on file  Social Connections: Not on file  Intimate Partner Violence: Not on file    Family History  Problem Relation Age of Onset   Pancreatic cancer Mother 18   Heart disease Mother    Esophageal cancer Father 2   Heart disease Sister    Breast cancer Maternal Aunt 60   Colon cancer Maternal Uncle     Pancreatic cancer Maternal Uncle    Colon cancer Paternal Uncle    Rectal cancer Neg Hx  Stomach cancer Neg Hx      Review of Systems  Constitutional: Negative.  Negative for chills and fever.  HENT: Negative.  Negative for congestion and sore throat.   Respiratory: Negative.  Negative for cough and shortness of breath.   Cardiovascular: Negative.  Negative for chest pain and palpitations.  Gastrointestinal:  Negative for abdominal pain, nausea and vomiting.  Skin: Negative.  Negative for rash.  Neurological:  Negative for dizziness and headaches.  All other systems reviewed and are negative.   Vitals:   02/05/24 1550  BP: 138/82  Pulse: 83  Temp: 99 F (37.2 C)  SpO2: 98%    Physical Exam Vitals reviewed.  Constitutional:      Appearance: Normal appearance.  HENT:     Head: Normocephalic.  Eyes:     Extraocular Movements: Extraocular movements intact.  Cardiovascular:     Rate and Rhythm: Normal rate.  Pulmonary:     Effort: Pulmonary effort is normal.  Musculoskeletal:     Comments: Left foot: No erythema, bruising or swelling.  Mild tenderness to the dorsal.  No findings of gout present.  Good circulation and good distal sensation.  Skin:    General: Skin is warm and dry.  Neurological:     Mental Status: She is alert and oriented to person, place, and time.  Psychiatric:        Mood and Affect: Mood normal.        Behavior: Behavior normal.   DG Foot Complete Left Result Date: 02/05/2024 CLINICAL DATA:  Four day history of left foot pain. No known injury. EXAM: LEFT FOOT - COMPLETE 3 VIEW COMPARISON:  None Available. FINDINGS: There is no evidence of fracture or dislocation. Accessory navicular. Mild hallux valgus. Soft tissues are unremarkable. IMPRESSION: 1. No acute fracture or dislocation. 2. Mild hallux valgus.  Accessory navicular. Electronically Signed   By: Limin  Xu M.D.   On: 02/05/2024 16:27      ASSESSMENT & PLAN: I personally spent a total of  30 minutes minutes in the care of the patient today including preparing to see the patient, getting/reviewing separately obtained history, performing a medically appropriate exam/evaluation, counseling and educating, placing orders, documenting clinical information in the EHR, independently interpreting results, communicating results, and coordinating care.  Problem List Items Addressed This Visit       Other   Left foot pain - Primary   Clinically stable.  No red flag signs or symptoms Unremarkable physical findings Recommend x-ray today Will review images when available Pain management discussed Has history of chronic pain May need referral to podiatrist.      Relevant Orders   DG Foot Complete Left   Patient Instructions  Foot Pain Many things can cause foot pain. Common causes include injuries to the foot. The injuries include sprains or broken bones, or injuries that affect the nerves in the feet. Other causes of foot pain include arthritis, blisters, and bunions. To know what causes your foot pain, your health care provider will take a detailed history of your symptoms. They will also do a physical exam as well as imaging tests, such as X-ray or MRI. Follow these instructions at home: Managing pain, stiffness, and swelling  If told, put ice on the painful area. Put ice in a plastic bag. Place a towel between your skin and the bag. Leave the ice on for 20 minutes, 2-3 times a day. If your skin turns bright red, remove the ice right away to prevent  skin damage. The risk of damage is higher if you cannot feel pain, heat, or cold. Activity Do not stand or walk for long periods. Do stretches to relieve foot pain and stiffness as told by your provider. Do not lift anything that is heavier than 10 lb (4.5 kg), or the limit that you are told, until your provider says that it is safe. Lifting a lot of weight can put added pressure on your feet. Return to your normal activities as told  by your provider. Ask your provider what activities are safe for you. Lifestyle Wear comfortable, supportive shoes that fit you well. Do not wear high heels. Keep your feet clean and dry. General instructions Take over-the-counter and prescription medicines only as told by your provider. Rub your foot gently. Pay attention to any changes in your symptoms. Let your provider know if symptoms become worse. Keep all follow-up visits. Your provider will want to monitor your progress. Contact a health care provider if: Your pain does not get better after a few days of treatment at home. Your pain gets worse. You cannot stand on your foot. Your foot or toes are swollen. Your foot is numb or tingling. Get help right away if: Your foot or toes turn white or blue. You have warmth and redness along your foot. This information is not intended to replace advice given to you by your health care provider. Make sure you discuss any questions you have with your health care provider. Document Revised: 07/06/2022 Document Reviewed: 03/14/2022 Elsevier Patient Education  2024 Elsevier Inc.      Emil Schaumann, MD [facility]

## 2024-02-07 ENCOUNTER — Other Ambulatory Visit: Payer: Self-pay | Admitting: Physical Medicine and Rehabilitation

## 2024-02-13 ENCOUNTER — Ambulatory Visit: Admitting: Internal Medicine

## 2024-02-13 ENCOUNTER — Other Ambulatory Visit: Payer: Self-pay

## 2024-02-13 ENCOUNTER — Ambulatory Visit: Admitting: Physical Medicine and Rehabilitation

## 2024-02-13 ENCOUNTER — Encounter: Payer: Self-pay | Admitting: Physical Medicine and Rehabilitation

## 2024-02-13 VITALS — BP 145/94

## 2024-02-13 DIAGNOSIS — M47816 Spondylosis without myelopathy or radiculopathy, lumbar region: Secondary | ICD-10-CM | POA: Diagnosis not present

## 2024-02-13 MED ORDER — METHYLPREDNISOLONE ACETATE 40 MG/ML IJ SUSP
40.0000 mg | Freq: Once | INTRAMUSCULAR | Status: AC
  Administered 2024-02-13: 40 mg

## 2024-02-13 NOTE — Progress Notes (Signed)
 Pain Scale   Average Pain 10    Pain Left Lower Back     +Driver, -BT, -Dye Allergies.

## 2024-02-14 ENCOUNTER — Ambulatory Visit (INDEPENDENT_AMBULATORY_CARE_PROVIDER_SITE_OTHER)

## 2024-02-14 ENCOUNTER — Ambulatory Visit (INDEPENDENT_AMBULATORY_CARE_PROVIDER_SITE_OTHER): Admitting: Family Medicine

## 2024-02-14 ENCOUNTER — Encounter: Payer: Self-pay | Admitting: Family Medicine

## 2024-02-14 ENCOUNTER — Ambulatory Visit: Payer: Self-pay | Admitting: Family Medicine

## 2024-02-14 ENCOUNTER — Ambulatory Visit: Payer: Self-pay | Admitting: *Deleted

## 2024-02-14 VITALS — BP 100/74 | HR 82 | Temp 97.8°F | Ht 59.0 in | Wt 139.0 lb

## 2024-02-14 DIAGNOSIS — M25552 Pain in left hip: Secondary | ICD-10-CM | POA: Diagnosis not present

## 2024-02-14 DIAGNOSIS — M1612 Unilateral primary osteoarthritis, left hip: Secondary | ICD-10-CM

## 2024-02-14 LAB — CBC WITH DIFFERENTIAL/PLATELET
Basophils Absolute: 0 K/uL (ref 0.0–0.1)
Basophils Relative: 0.3 % (ref 0.0–3.0)
Eosinophils Absolute: 0 K/uL (ref 0.0–0.7)
Eosinophils Relative: 0.6 % (ref 0.0–5.0)
HCT: 34 % — ABNORMAL LOW (ref 36.0–46.0)
Hemoglobin: 10.7 g/dL — ABNORMAL LOW (ref 12.0–15.0)
Lymphocytes Relative: 29.2 % (ref 12.0–46.0)
Lymphs Abs: 2.4 K/uL (ref 0.7–4.0)
MCHC: 31.3 g/dL (ref 30.0–36.0)
MCV: 71.2 fl — ABNORMAL LOW (ref 78.0–100.0)
Monocytes Absolute: 0.5 K/uL (ref 0.1–1.0)
Monocytes Relative: 6.3 % (ref 3.0–12.0)
Neutro Abs: 5.2 K/uL (ref 1.4–7.7)
Neutrophils Relative %: 63.6 % (ref 43.0–77.0)
Platelets: 288 K/uL (ref 150.0–400.0)
RBC: 4.77 Mil/uL (ref 3.87–5.11)
RDW: 14.8 % (ref 11.5–15.5)
WBC: 8.2 K/uL (ref 4.0–10.5)

## 2024-02-14 NOTE — Telephone Encounter (Signed)
 Copied from CRM #[id number]. Topic: Clinical - Red Word Triage >> Feb 14, 2024  9:56 AM Berneda FALCON wrote: Red Word that prompted transfer to Nurse Triage: pt states she had injections in back yesterday but wanted her to know that left side of the hip is now swollen again Reason for Disposition  [1] MODERATE pain (e.g., interferes with normal activities, limping) AND [2] present > 3 days  Answer Assessment - Initial Assessment Questions 1. LOCATION and RADIATION: Where is the pain located? Does the pain spread (shoot) anywhere else?     I had shots in my back yesterday by my orthopedic doctor.  The shots are for the arthritis in my back.   He said it might take 2 weeks to take effect.   This was like this 2 weeks ago.   I don't know what to do.    2 weeks ago I saw Dr. Delynn.  The prednisone  he gave me helped me but now the pain has come back in my left hip.     I'm having pain in my left hip and down my left leg and foot.   This is a separate issue.    2. QUALITY: What does the pain feel like?  (e.g., sharp, dull, aching, burning)     When I put pressure on it it hurts.    3. SEVERITY: How bad is the pain? What does it keep you from doing?   (Scale 1-10; or mild, moderate, severe)     This pain has come back after completing the prednisone .   4. ONSET: When did the pain start? Does it come and go, or is it there all the time?     This has been going on since my early 33's.  5. WORK OR EXERCISE: Has there been any recent work or exercise that involved this part of the body?      No 6. CAUSE: What do you think is causing the hip pain?      I don't know 7. AGGRAVATING FACTORS: What makes the hip pain worse? (e.g., walking, climbing stairs, running)     Walking is painful   8. OTHER SYMPTOMS: Do you have any other symptoms? (e.g., back pain, pain shooting down leg,  fever, rash)     Pain down my left leg.   I'm on my feet all day.  Protocols used: Hip Pain-A-AH FYI Only  or Action Required?: FYI only for provider.  Patient was last seen in primary care on 02/05/2024 by Purcell Emil Schanz, MD.  Called Nurse Triage reporting Hip Pain.  Symptoms began several years ago.  Interventions attempted: Prescription medications: prednisone  helped but now the pain has come back in left hip.  Symptoms are: gradually worsening.  Triage Disposition: See PCP When Office is Open (Within 3 Days)  Patient/caregiver understands and will follow disposition?: Yes

## 2024-02-14 NOTE — Progress Notes (Signed)
 Subjective:     Patient ID: [name], female    DOB: 1961-04-11, 63 y.o.   MRN: [id number]  Chief Complaint  Patient presents with   Hip Pain    Left hip pain radiating down to her leg, completed prednisone  from Dr. Purcell and swelling and pain is back. Arthritis     Hip Pain     Discussed the use of AI scribe software for clinical note transcription with the patient, who gave verbal consent to proceed.  History of Present Illness [name] is a 63 year old female who presents with left hip and low back pain.  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 due to pain - No relief from initial treatment with steroid dose pack, meloxicam , and tramadol  started August 7th - No relief from left low back injection administered yesterday  Associated symptoms and systemic findings - No fever, chills, nausea, vomiting, or signs of infection  Medication intolerance and allergies - Allergic to aspirin - Unable to take Tylenol  due to gastrointestinal upset     Health Maintenance Due  Topic Date Due   MAMMOGRAM  10/22/2021   COVID-19 Vaccine (4 - 2024-25 season) 02/25/2023   INFLUENZA VACCINE  01/25/2024    Past Medical History:  Diagnosis Date   Anemia    Arthritis    neck, hips (04/25/2018)   Claustrophobia    Daily headache    no longer a problem   GERD (gastroesophageal reflux disease)    Hiatal hernia    Hyperlipidemia    hx (04/25/2018)   Hypertension    Plantar fasciitis    hx - left foot   Pneumonia    Stroke (HCC) 05/2014   mini stroke (04/25/2018)    Past Surgical History:  Procedure Laterality Date   ABDOMINAL HYSTERECTOMY  12/09   Secondary to fibroids   ARTERY BIOPSY Right 04/10/2018    Procedure: BIOPSY TEMPORAL ARTERY;  Surgeon: Oris Krystal FALCON, MD;  Location: [facility] OR;  Service: Vascular;  Laterality: Right;   CESAREAN SECTION  1986; 1988   COLONOSCOPY     CONDYLOMA EXCISION/FULGURATION N/A 11/30/2023   Procedure: REMOVAL, CONDYLOMA;  Surgeon: Teresa Lonni HERO, MD;  Location: [facility] ORS;  Service: General;  Laterality: N/A;  Excision of hypertrophic anal papilla   FOOT SURGERY Left 03/2017   Bone Spurs Removed   INJECTION, CHEMODENERVATION AGENT N/A 11/30/2023   Procedure: INJECTION, CHEMODENERVATION AGENT;  Surgeon: Teresa Lonni HERO, MD;  Location: [facility] ORS;  Service: General;  Laterality: N/A;   LUMBAR LAMINECTOMY/DECOMPRESSION MICRODISCECTOMY N/A 04/13/2021   Procedure: LUMBAR FOUR- LUMBAR FIVE AND LUMBAR FIVE-SACRAL ONE  DECOMPRESSION;  Surgeon: Barbarann Oneil BROCKS, MD;  Location: [facility] OR;  Service: Orthopedics;  Laterality: N/A;   RADIOLOGY WITH ANESTHESIA N/A 04/26/2018   Procedure: MRI WITH ANESTHESIA;  Surgeon: Radiologist, Medication, MD;  Location: [facility] OR;  Service: Radiology;  Laterality: N/A;   RADIOLOGY WITH ANESTHESIA N/A 08/24/2019   Procedure: MRI WITH ANESTHESIA CERVICAL SPINE;  Surgeon: Radiologist, Medication, MD;  Location: [facility] OR;  Service: Radiology;  Laterality: N/A;   RADIOLOGY WITH ANESTHESIA N/A 10/02/2019   Procedure: MRI WITH ANESTHESIA CERVICAL WITHOUT CONTRAST;  Surgeon: Radiologist, Medication, MD;  Location: [facility] OR;  Service: Radiology;  Laterality: N/A;  RECTAL EXAM UNDER ANESTHESIA N/A 11/30/2023   Procedure: EXAM UNDER ANESTHESIA, RECTUM;  Surgeon: Teresa Lonni HERO, MD;  Location: [facility] ORS;  Service: General;  Laterality: N/A;   TUBAL LIGATION  1988    Family History  Problem Relation Age of Onset   Pancreatic cancer Mother 5   Heart disease Mother    Esophageal cancer Father 51   Heart disease Sister    Breast cancer Maternal Aunt 60   Colon cancer Maternal Uncle    Pancreatic cancer Maternal Uncle    Colon cancer Paternal Uncle    Rectal cancer  Neg Hx    Stomach cancer Neg Hx     Social History   Socioeconomic History   Marital status: Divorced    Spouse name: Not on file   Number of children: 2   Years of education: Not on file   Highest education level: Not on file  Occupational History   Not on file  Tobacco Use   Smoking status: Some Days    Current packs/day: 0.25    Average packs/day: 0.3 packs/day for 38.0 years (9.5 ttl pk-yrs)    Types: Cigarettes   Smokeless tobacco: Never   Tobacco comments:    1 cigs/day 08/17/2022 km,cma  Vaping Use   Vaping status: Never Used  Substance and Sexual Activity   Alcohol use: Yes    Alcohol/week: 4.0 standard drinks of alcohol    Types: 4 Cans of beer per week   Drug use: Not Currently   Sexual activity: Not Currently    Birth control/protection: Surgical    Comment: Hysterectomy  Other Topics Concern   Not on file  Social History Narrative   10 th grade education.   Social Drivers of Corporate investment banker Strain: Not on file  Food Insecurity: Not on file  Transportation Needs: No Transportation Needs (08/27/2019)   PRAPARE - Administrator, Civil Service (Medical): No    Lack of Transportation (Non-Medical): No  Physical Activity: Not on file  Stress: Not on file  Social Connections: Not on file  Intimate Partner Violence: Not on file    Outpatient Medications Prior to Visit  Medication Sig Dispense Refill   albuterol  (VENTOLIN  HFA) 108 (90 Base) MCG/ACT inhaler Inhale 2 puffs into the lungs every 6 (six) hours as needed for wheezing or shortness of breath. 8 g 0   amLODipine  (NORVASC ) 5 MG tablet Take 1 tablet (5 mg total) by mouth daily. 90 tablet 1   ferrous sulfate  325 (65 FE) MG tablet Take 325 mg by mouth daily.     folic acid  (FOLVITE ) 1 MG tablet Take 1 tablet (1 mg total) by mouth daily. 90 tablet 0   lidocaine  (XYLOCAINE ) 5 % ointment APPLY 1 APPLICATION TOPICALLY TWICE DAILY AS NEEDED 36 g 0   lipase/protease/amylase (CREON ) 36000  UNITS CPEP capsule Take 3 capsules (108,000 Units total) by mouth 3 (three) times daily with meals AND 2 capsules (72,000 Units total) with snacks. (Patient taking differently: Take 4 capsules by mouth 3 times daily with meals AND 2 capsules with snacks.) 390 capsule 11   meloxicam  (MOBIC ) 15 MG tablet Take 1 tablet by mouth once daily 30 tablet 0   nicotine  (NICODERM CQ  - DOSED IN MG/24 HOURS) 14 mg/24hr patch Place 1 patch (14 mg total) onto the skin daily. 28 patch 0   nicotine  polacrilex (NICORETTE ) 4 MG gum Take 1 each (4 mg total) by mouth as needed for smoking cessation.  100 tablet 0   omega-3 acid ethyl esters (LOVAZA ) 1 g capsule Take 2 capsules (2 g total) by mouth 2 (two) times daily. 120 capsule 1   ondansetron  (ZOFRAN -ODT) 8 MG disintegrating tablet Take 1 tablet (8 mg total) by mouth every 8 (eight) hours as needed for nausea or vomiting. 15 tablet 0   pantoprazole  (PROTONIX ) 40 MG tablet Take 1 tablet (40 mg total) by mouth 2 (two) times daily. Take 30 minutes before breakfast. 60 tablet 2   potassium chloride  SA (KLOR-CON  M) 20 MEQ tablet Take 1 tablet (20 mEq total) by mouth daily. 30 tablet 3   prochlorperazine  (COMPAZINE ) 10 MG tablet Take 1 tablet (10 mg total) by mouth 2 (two) times daily as needed for nausea or vomiting. 10 tablet 0   traMADol  (ULTRAM ) 50 MG tablet Take 1 tablet (50 mg total) by mouth every 8 (eight) hours as needed. 20 tablet 0   Vitamin D , Ergocalciferol , (DRISDOL ) 1.25 MG (50000 UNIT) CAPS capsule Take 1 capsule (50,000 Units total) by mouth every 7 (seven) days. 6 capsule 1   cyclobenzaprine  (FLEXERIL ) 10 MG tablet Take 1 tablet (10 mg total) by mouth at bedtime. (Patient not taking: Reported on 02/14/2024) 30 tablet 0   diclofenac  Sodium (VOLTAREN ) 1 % GEL Apply 2 g topically 4 (four) times daily. (Patient not taking: Reported on 02/14/2024) 200 g 1   diltiazem  2 % GEL Please apply 5 times daily (Patient not taking: Reported on 02/14/2024) 30 g 1   Omega 3 1000  MG CAPS Take 2 g by mouth daily. (Patient not taking: Reported on 02/14/2024)     No facility-administered medications prior to visit.    Allergies  Allergen Reactions   Aspirin Hives   Hydromorphone Hcl Hives and Nausea And Vomiting    Can take Norco   Crestor  [Rosuvastatin ]     myalgias   Latex Rash    Review of Systems  Constitutional:  Negative for chills and fever.  Respiratory:  Negative for shortness of breath.   Cardiovascular:  Negative for chest pain, palpitations and leg swelling.  Gastrointestinal:  Positive for diarrhea. Negative for abdominal pain, constipation, nausea and vomiting.       Chronic diarrhea.   Genitourinary:  Negative for dysuria, frequency and urgency.  Neurological:  Negative for dizziness.       Objective:    Physical Exam Constitutional:      General: She is not in acute distress.    Appearance: She is not ill-appearing.  HENT:     Mouth/Throat:     Mouth: Mucous membranes are moist.     Pharynx: Oropharynx is clear.  Eyes:     Extraocular Movements: Extraocular movements intact.     Conjunctiva/sclera: Conjunctivae normal.  Cardiovascular:     Rate and Rhythm: Normal rate and regular rhythm.  Pulmonary:     Effort: Pulmonary effort is normal.     Breath sounds: Normal breath sounds.  Abdominal:     General: There is no distension.     Palpations: Abdomen is soft.  Musculoskeletal:     Cervical back: Normal range of motion and neck supple.     Lumbar back: Decreased range of motion. Negative right straight leg raise test and negative left straight leg raise test.     Left hip: Decreased range of motion. Normal strength.     Comments: Left hip - pain with exam, unable to tolerate internal or external rotation testing. Pain with flexion.   Skin:  General: Skin is warm and dry.  Neurological:     General: No focal deficit present.     Mental Status: She is alert and oriented to person, place, and time.     Sensory: No sensory  deficit.     Motor: No weakness.     Gait: Gait abnormal.  Psychiatric:        Mood and Affect: Mood normal.        Behavior: Behavior normal.        Thought Content: Thought content normal.      BP 100/74   Pulse 82   Temp 97.8 F (36.6 C) (Temporal)   Ht 4' 11 (1.499 m)   Wt 139 lb (63 kg)   LMP  (LMP Unknown)   SpO2 98%   BMI 28.07 kg/m  Wt Readings from Last 3 Encounters:  02/14/24 139 lb (63 kg)  02/05/24 139 lb (63 kg)  01/31/24 138 lb (62.6 kg)       Assessment & Plan:   Problem List Items Addressed This Visit     Left hip pain - Primary   Relevant Orders   CBC with Differential/Platelet (Completed)   DG HIP UNILAT W OR W/O PELVIS 2-3 VIEWS LEFT (Completed)   Primary osteoarthritis of left hip   Relevant Orders   CBC with Differential/Platelet (Completed)   DG HIP UNILAT W OR W/O PELVIS 2-3 VIEWS LEFT (Completed)    Assessment and Plan Assessment & Plan Left hip and low back pain with radicular symptoms (sciatica) Chronic left hip and low back pain with radicular symptoms consistent with sciatica. Pain radiates to the feet, exacerbated by prolonged standing and walking. Recent steroid dose pack and meloxicam  provided no relief. Tramadol  is currently being used. Recent injection by orthopedist has not yet provided relief, expected to take up to two weeks.  She plans to reduce work hours to manage pain. - Order x-ray of hip and pelvis to rule out other causes of pain. - Order CBC to check white blood cell count. - Advise to contact orthopedist Megan for follow-up regarding hip pain. - Provide note for work absence if needed.   Recent injection Recent injection administered by orthopedist Dr. Eldonna for left low back pain. Expected to take up to two weeks for full effect. No immediate relief reported.   No red flag symptoms.   I am having Ruth MYRTIS Bauer maintain her diclofenac  Sodium, lipase/protease/amylase, diltiazem , albuterol , ondansetron ,  prochlorperazine , ferrous sulfate , folic acid , pantoprazole , cyclobenzaprine , lidocaine , amLODipine , omega-3 acid ethyl esters, nicotine , Vitamin D  (Ergocalciferol ), traMADol , potassium chloride  SA, nicotine  polacrilex, Omega 3, and meloxicam .  No orders of the defined types were placed in this encounter.

## 2024-02-14 NOTE — Patient Instructions (Signed)
 Please go downstairs for an x-ray and labs.  Call and schedule a follow-up with your orthopedist  Continue meloxicam  and tramadol .  Use an ice pack or heating pad.  You can also use topical pain medicine over-the-counter such as Salonpas with lidocaine  or lidocaine  patches.  If you have any new or worsening symptoms such as loss of control of bowels or bladder, fever, weakness of your left leg, numbness or inability to walk, go to the emergency department or call 911.

## 2024-02-17 NOTE — Procedures (Signed)
 Lumbar Facet Joint Intra-Articular Injection(s) with Fluoroscopic Guidance  Patient: [name]      Date of Birth: [birth year] MRN: [id number] PCP: Lendia Boby CROME, NP-C      Visit Date: 02/13/2024   Universal Protocol:    Date/Time: 02/13/2024  Consent Given By: the patient  Position: PRONE   Additional Comments: Vital signs were monitored before and after the procedure. Patient was prepped and draped in the usual sterile fashion. The correct patient, procedure, and site was verified.   Injection Procedure Details:  Procedure Site One Meds Administered:  Meds ordered this encounter  Medications   methylPREDNISolone  acetate (DEPO-MEDROL ) injection 40 mg     Laterality: Bilateral  Location/Site:  L4-L5 L5-S1  Needle size: 22 guage  Needle type: Spinal  Needle Placement: Articular  Findings:  -Comments: Excellent flow of contrast producing a partial arthrogram.  Procedure Details: The fluoroscope beam is vertically oriented in AP, and the inferior recess is visualized beneath the lower pole of the inferior apophyseal process, which represents the target point for needle insertion. When direct visualization is difficult the target point is located at the medial projection of the vertebral pedicle. The region overlying each aforementioned target is locally anesthetized with a 1 to 2 ml. volume of 1% Lidocaine  without Epinephrine .   The spinal needle was inserted into each of the above mentioned facet joints using biplanar fluoroscopic guidance. A 0.25 to 0.5 ml. volume of Isovue-250 was injected and a partial facet joint arthrogram was obtained. A single spot film was obtained of the resulting arthrogram.    One to 1.25 ml of the steroid/anesthetic solution was then injected into each of the facet joints noted above.   Additional Comments:  No complications occurred Dressing: 2 x 2 sterile gauze and Band-Aid    Post-procedure details: Patient was observed  during the procedure. Post-procedure instructions were reviewed.  Patient left the clinic in stable condition.

## 2024-02-17 NOTE — Progress Notes (Signed)
 [name] - 63 y.o. female MRN [id number]  Date of birth: October 23, 1960  Office Visit Note: Visit Date: 02/13/2024 PCP: Lendia Boby CROME, NP-C Referred by: Lendia Boby CROME, NP-C  Subjective: Chief Complaint  Patient presents with   Lower Back - Pain   HPI:  JEROLENE KUPFER is a 63 y.o. female who comes in today at the request of Duwaine Pouch, FNP for planned Bilateral  L4-5 and L5-S1 Lumbar facet/medial branch block with fluoroscopic guidance.  The patient has failed conservative care including home exercise, medications, time and activity modification.  This injection will be diagnostic and hopefully therapeutic.  Please see requesting physician notes for further details and justification.  Exam has shown concordant pain with facet joint loading.   ROS Otherwise per HPI.  Assessment & Plan: Visit Diagnoses:    ICD-10-CM   1. Spondylosis without myelopathy or radiculopathy, lumbar region  M47.816 XR C-ARM NO REPORT    Facet Injection    methylPREDNISolone  acetate (DEPO-MEDROL ) injection 40 mg      Plan: No additional findings.   Meds & Orders:  Meds ordered this encounter  Medications   methylPREDNISolone  acetate (DEPO-MEDROL ) injection 40 mg    Orders Placed This Encounter  Procedures   Facet Injection   XR C-ARM NO REPORT    Follow-up: Return for visit to requesting provider as needed.   Procedures: No procedures performed  Lumbar Facet Joint Intra-Articular Injection(s) with Fluoroscopic Guidance  Patient: SHARLYNN SECKINGER      Date of Birth: Nov 07, 1960 MRN: [id number] PCP: Lendia Boby CROME, NP-C      Visit Date: 02/13/2024   Universal Protocol:    Date/Time: 02/13/2024  Consent Given By: the patient  Position: PRONE   Additional Comments: Vital signs were monitored before and after the procedure. Patient was prepped and draped in the usual sterile fashion. The correct patient, procedure, and site was verified.   Injection Procedure Details:   Procedure Site One Meds Administered:  Meds ordered this encounter  Medications   methylPREDNISolone  acetate (DEPO-MEDROL ) injection 40 mg     Laterality: Bilateral  Location/Site:  L4-L5 L5-S1  Needle size: 22 guage  Needle type: Spinal  Needle Placement: Articular  Findings:  -Comments: Excellent flow of contrast producing a partial arthrogram.  Procedure Details: The fluoroscope beam is vertically oriented in AP, and the inferior recess is visualized beneath the lower pole of the inferior apophyseal process, which represents the target point for needle insertion. When direct visualization is difficult the target point is located at the medial projection of the vertebral pedicle. The region overlying each aforementioned target is locally anesthetized with a 1 to 2 ml. volume of 1% Lidocaine  without Epinephrine .   The spinal needle was inserted into each of the above mentioned facet joints using biplanar fluoroscopic guidance. A 0.25 to 0.5 ml. volume of Isovue-250 was injected and a partial facet joint arthrogram was obtained. A single spot film was obtained of the resulting arthrogram.    One to 1.25 ml of the steroid/anesthetic solution was then injected into each of the facet joints noted above.   Additional Comments:  No complications occurred Dressing: 2 x 2 sterile gauze and Band-Aid    Post-procedure details: Patient was observed during the procedure. Post-procedure instructions were reviewed.  Patient left the clinic in stable condition.     Clinical History: MR LUMBAR SPINE WITHOUT IV CONTRAST   COMPARISON: 03/10/2021   CLINICAL HISTORY: Low back pain, symptoms persist with conservative treatment.  TECHNIQUE: SAG T2, SAG T1, SAG STIR, AX T2, AX T1 without IV contrast.   FINDINGS: There is normal alignment of the lumbar spine. Mild multilevel disc desiccation is present with moderate facet arthrosis in the lower lumbar spine. Postlaminectomy changes  are seen at L4-5 and L5-S1. There is no vertebral body height loss, subluxation or marrow replacing process. The sacrum and SI joints are unremarkable so far as visualized. Conus and cauda equina are unremarkable.   T12-L1: Mild facet arthrosis. No significant foraminal or spinal stenosis.   L1-2: Mild facet arthrosis. No significant foraminal or spinal stenosis.   L2-3: Mild broad-based bulge without significant foraminal or spinal stenosis. Mild facet arthrosis is present.   L3-4: Mild broad-based disc osteophyte without significant foraminal or spinal stenosis. Mild-to-moderate facet arthrosis is present.   L4-5: There is a broad-based bulge and moderate facet arthrosis slightly crowding the descending nerve roots in the lateral recess. There is moderate bilateral foraminal narrowing. Correlation for L4 radiculopathy. Laminectomy changes are present.   L5-S1: Broad-based disc osteophyte with moderate facet arthrosis. There is caudal foraminal narrowing bilaterally. Correlation for L5 radicular symptoms. No significant spinal stenosis. Prior laminectomy.   The retroperitoneal structures demonstrate no significant abnormality.   IMPRESSION: Postlaminectomy changes at L4-5 and L5-S1. There is no significant residual spinal stenosis. There is stable moderate foraminal narrowing at L4-5 and L5-S1 bilaterally. Correlation for mild radicular symptoms.   Electronically signed by: Norleen Satchel MD 11/26/2023 05:34 PM EDT RP Workstation: [id number]     Objective:  VS:  HT:    WT:   BMI:     BP:(!) 145/94  HR: bpm  TEMP: ( )  RESP:  Physical Exam Vitals and nursing note reviewed.  Constitutional:      General: She is not in acute distress.    Appearance: Normal appearance. She is not ill-appearing.  HENT:     Head: Normocephalic and atraumatic.     Right Ear: External ear normal.     Left Ear: External ear normal.  Eyes:     Extraocular Movements: Extraocular movements  intact.  Cardiovascular:     Rate and Rhythm: Normal rate.     Pulses: Normal pulses.  Pulmonary:     Effort: Pulmonary effort is normal. No respiratory distress.  Abdominal:     General: There is no distension.     Palpations: Abdomen is soft.  Musculoskeletal:        General: Tenderness present.     Cervical back: Neck supple.     Right lower leg: No edema.     Left lower leg: No edema.     Comments: Patient has good distal strength with no pain over the greater trochanters.  No clonus or focal weakness.  Skin:    Findings: No erythema, lesion or rash.  Neurological:     General: No focal deficit present.     Mental Status: She is alert and oriented to person, place, and time.     Sensory: No sensory deficit.     Motor: No weakness or abnormal muscle tone.     Coordination: Coordination normal.  Psychiatric:        Mood and Affect: Mood normal.        Behavior: Behavior normal.      Imaging: No results found.

## 2024-02-19 ENCOUNTER — Ambulatory Visit: Admitting: Physical Medicine and Rehabilitation

## 2024-02-19 ENCOUNTER — Encounter: Payer: Self-pay | Admitting: Physical Medicine and Rehabilitation

## 2024-02-19 DIAGNOSIS — M961 Postlaminectomy syndrome, not elsewhere classified: Secondary | ICD-10-CM

## 2024-02-19 DIAGNOSIS — M5442 Lumbago with sciatica, left side: Secondary | ICD-10-CM | POA: Diagnosis not present

## 2024-02-19 DIAGNOSIS — M7062 Trochanteric bursitis, left hip: Secondary | ICD-10-CM | POA: Diagnosis not present

## 2024-02-19 DIAGNOSIS — M5416 Radiculopathy, lumbar region: Secondary | ICD-10-CM

## 2024-02-19 MED ORDER — HYDROCODONE-ACETAMINOPHEN 5-325 MG PO TABS
1.0000 | ORAL_TABLET | Freq: Three times a day (TID) | ORAL | 0 refills | Status: AC | PRN
Start: 2024-02-19 — End: ?

## 2024-02-19 NOTE — Progress Notes (Unsigned)
 [name] - 63 y.o. female MRN [id number]  Date of birth: [birth year]  Office Visit Note: Visit Date: 02/19/2024 PCP: Lendia Boby CROME, NP-C Referred by: Lendia Boby CROME, NP-C  Subjective: Chief Complaint  Patient presents with   Left Hip - Pain   HPI: [name] is a 63 y.o. female who comes in today for evaluation of acute left sided buttock pain radiating around to hip and down posterior leg. Pain ongoing for 3 weeks. Her pain becomes severe when standing and walking. She describes pain as throbbing sensation, currently rates pain as 10 out of 10. Sitting seems to help alleviate her pain. Recent lumbar MRI imaging shows postlaminectomy changes at L4-5 and L5-S1, no significant residual stenosis. There is stable moderate foraminal stenosis and moderate facet arthropathy at these levels. She recently underwent bilateral L4-L5 and L5-S1 facet joint injections in our office on 02/13/2024. She reports significant relief of axial back pain, greater than 50% that continues to sustain. Patient denies focal weakness. No recent trauma or falls.      Review of Systems  Musculoskeletal:  Positive for back pain, joint pain and myalgias.  Neurological:  Negative for tingling, sensory change, focal weakness and weakness.  All other systems reviewed and are negative.  Otherwise per HPI.  Assessment & Plan: Visit Diagnoses:    ICD-10-CM   1. Greater trochanteric bursitis, left  M70.62 Large Joint Inj: L greater trochanter    2. Acute left-sided low back pain with left-sided sciatica  M54.42     3. Lumbar radiculopathy  M54.16     4. Post laminectomy syndrome  M96.1        Plan: Findings:  Acute left sided buttock pain radiating around to hip and down posterior leg. Biggest pain generator localized to left lateral hip region. I do think this is a more of a greater trochanteric bursitis. Other differentials include lumbar radiculopathy and intrinsic left hip issue. We discussed  treatment plan in detail today. I performed left greater trochanter injection in the office today, patient tolerated without difficulty. I instructed her to let us  know how she is doing in a couple of days. I will provider her with a note for work. I also refilled short course of Norco. Should her pain persist would recommend trying left S1 transforaminal epidural steroid injection. She has no questions at this time. No red flag symptoms noted upon exam today.     Meds & Orders:  Meds ordered this encounter  Medications   HYDROcodone -acetaminophen  (NORCO/VICODIN) 5-325 MG tablet    Sig: Take 1 tablet by mouth every 8 (eight) hours as needed for moderate pain (pain score 4-6).    Dispense:  20 tablet    Refill:  0    Orders Placed This Encounter  Procedures   Large Joint Inj: L greater trochanter    Follow-up: Return if symptoms worsen or fail to improve.   Procedures: Large Joint Inj: L greater trochanter on 02/19/2024 4:50 PM Indications: pain and diagnostic evaluation Details: 22 G 3.5 in needle, lateral approach Medications: 40 mg triamcinolone  acetonide 40 MG/ML; 5 mL bupivacaine  0.25 % Outcome: tolerated well, no immediate complications Consent was given by the patient. Immediately prior to procedure a time out was called to verify the correct patient, procedure, equipment, support staff and site/side marked as required. Patient was prepped and draped in the usual sterile fashion.          Clinical History: MR LUMBAR SPINE WITHOUT IV CONTRAST  COMPARISON: 03/10/2021   CLINICAL HISTORY: Low back pain, symptoms persist with conservative treatment.   TECHNIQUE: SAG T2, SAG T1, SAG STIR, AX T2, AX T1 without IV contrast.   FINDINGS: There is normal alignment of the lumbar spine. Mild multilevel disc desiccation is present with moderate facet arthrosis in the lower lumbar spine. Postlaminectomy changes are seen at L4-5 and L5-S1. There is no vertebral body height loss,  subluxation or marrow replacing process. The sacrum and SI joints are unremarkable so far as visualized. Conus and cauda equina are unremarkable.   T12-L1: Mild facet arthrosis. No significant foraminal or spinal stenosis.   L1-2: Mild facet arthrosis. No significant foraminal or spinal stenosis.   L2-3: Mild broad-based bulge without significant foraminal or spinal stenosis. Mild facet arthrosis is present.   L3-4: Mild broad-based disc osteophyte without significant foraminal or spinal stenosis. Mild-to-moderate facet arthrosis is present.   L4-5: There is a broad-based bulge and moderate facet arthrosis slightly crowding the descending nerve roots in the lateral recess. There is moderate bilateral foraminal narrowing. Correlation for L4 radiculopathy. Laminectomy changes are present.   L5-S1: Broad-based disc osteophyte with moderate facet arthrosis. There is caudal foraminal narrowing bilaterally. Correlation for L5 radicular symptoms. No significant spinal stenosis. Prior laminectomy.   The retroperitoneal structures demonstrate no significant abnormality.   IMPRESSION: Postlaminectomy changes at L4-5 and L5-S1. There is no significant residual spinal stenosis. There is stable moderate foraminal narrowing at L4-5 and L5-S1 bilaterally. Correlation for mild radicular symptoms.   Electronically signed by: Norleen Satchel MD 11/26/2023 05:34 PM EDT RP Workstation: [id number]   She reports that she has been smoking cigarettes. She has a 9.5 pack-year smoking history. She has never used smokeless tobacco. No results for input(s): HGBA1C, LABURIC in the last 8760 hours.  Objective:  VS:  HT:    WT:   BMI:     BP:   HR: bpm  TEMP: ( )  RESP:  Physical Exam Vitals and nursing note reviewed.  HENT:     Head: Normocephalic and atraumatic.     Right Ear: External ear normal.     Left Ear: External ear normal.     Nose: Nose normal.     Mouth/Throat:     Mouth: Mucous  membranes are moist.  Eyes:     Extraocular Movements: Extraocular movements intact.  Cardiovascular:     Rate and Rhythm: Normal rate.     Pulses: Normal pulses.  Pulmonary:     Effort: Pulmonary effort is normal.  Abdominal:     General: Abdomen is flat. There is no distension.  Musculoskeletal:        General: Tenderness present.     Cervical back: Normal range of motion.     Comments: Patient rises from seated position to standing without difficulty. Good lumbar range of motion. No pain noted with facet loading. 5/5 strength noted with bilateral hip flexion, knee flexion/extension, ankle dorsiflexion/plantarflexion and EHL. No clonus noted bilaterally. Tenderness noted to left greater trochanter upon palpation.. No pain with internal/external rotation of bilateral hips. Sensation intact bilaterally. Dysesthesias noted to left S1 dermatome. Negative slump test bilaterally. Ambulates without aid, gait steady.     Skin:    General: Skin is warm and dry.     Capillary Refill: Capillary refill takes less than 2 seconds.  Neurological:     General: No focal deficit present.     Mental Status: She is alert and oriented to person, place, and time.  Psychiatric:  Mood and Affect: Mood normal.        Behavior: Behavior normal.     Ortho Exam  Imaging: No results found.  Past Medical/Family/Surgical/Social History: Medications & Allergies reviewed per EMR, new medications updated. 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   Cough due to ACE inhibitor 05/16/2022   Myalgia 05/16/2022   Acute bronchitis 05/16/2022   Hypomagnesemia 03/19/2022   Vitamin B 12  deficiency 03/19/2022   Low TSH level 03/19/2022   Right elbow pain 03/19/2022   Encounter for screening mammogram for malignant neoplasm of breast 03/19/2022   History of hypertension 12/13/2021   History of smoking 12/13/2021   Elevated blood pressure reading 12/13/2021   Contusion of right knee 12/13/2021   Fall 12/13/2021   Muscle cramps 12/04/2021   Elevated liver enzymes 12/04/2021   Paresthesias 12/04/2021   Elevated LFTs 12/04/2021   Anemia 10/20/2021   Chronic foot pain 10/20/2021   Folic acid  deficiency 09/19/2021   Chronic pain syndrome 09/07/2021   Status post lumbar spine surgery for decompression of spinal cord 05/24/2021   Dyspepsia 05/23/2021   History of gastroesophageal reflux (GERD) 05/23/2021   History of lumbar laminectomy for spinal cord decompression 04/26/2021   Lumbar stenosis 04/13/2021   Tension headache 12/22/2020   Low back pain 10/20/2020   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/28/2020   Hypokalemia 12/17/2019   Chronic diarrhea 09/14/2017   History of stroke 06/12/2014   Cervical radiculitis 02/12/2014   Essential hypertension, benign 01/24/2010   Microcytic anemia 01/11/2010   Hyperlipemia 05/28/2007   Current smoker 05/28/2007   GERD 05/28/2007   Past Medical History:  Diagnosis Date   Anemia    Arthritis    neck, hips (04/25/2018)   Claustrophobia    Daily headache    no longer a problem   GERD (gastroesophageal reflux disease)    Hiatal hernia    Hyperlipidemia    hx (04/25/2018)   Hypertension    Plantar fasciitis    hx - left foot   Pneumonia    Stroke (HCC) 05/2014   mini stroke (04/25/2018)   Family History  Problem Relation Age of Onset   Pancreatic cancer Mother 26   Heart disease Mother    Esophageal cancer Father 7   Heart disease Sister    Breast cancer Maternal Aunt 60   Colon cancer Maternal Uncle    Pancreatic cancer Maternal Uncle    Colon cancer Paternal Uncle    Rectal cancer Neg Hx    Stomach  cancer Neg Hx    Past Surgical History:  Procedure Laterality Date   ABDOMINAL HYSTERECTOMY  12/09   Secondary to fibroids   ARTERY BIOPSY Right 04/10/2018   Procedure: BIOPSY TEMPORAL ARTERY;  Surgeon: Oris Krystal FALCON, MD;  Location: [facility] OR;  Service: Vascular;  Laterality: Right;   CESAREAN SECTION  1986; 1988   COLONOSCOPY     CONDYLOMA EXCISION/FULGURATION N/A 11/30/2023   Procedure: REMOVAL, CONDYLOMA;  Surgeon: Teresa Lonni HERO, MD;  Location: [facility] ORS;  Service: General;  Laterality: N/A;  Excision of hypertrophic anal papilla   FOOT SURGERY Left 03/2017   Bone Spurs Removed   INJECTION, CHEMODENERVATION AGENT N/A  11/30/2023   Procedure: INJECTION, CHEMODENERVATION AGENT;  Surgeon: Teresa Lonni HERO, MD;  Location: [facility];  Service: General;  Laterality: N/A;   LUMBAR LAMINECTOMY/DECOMPRESSION MICRODISCECTOMY N/A 04/13/2021   Procedure: LUMBAR FOUR- LUMBAR FIVE AND LUMBAR FIVE-SACRAL ONE  DECOMPRESSION;  Surgeon: Barbarann Oneil BROCKS, MD;  Location: [facility] OR;  Service: Orthopedics;  Laterality: N/A;   RADIOLOGY WITH ANESTHESIA N/A 04/26/2018   Procedure: MRI WITH ANESTHESIA;  Surgeon: Radiologist, Medication, MD;  Location: [facility] OR;  Service: Radiology;  Laterality: N/A;   RADIOLOGY WITH ANESTHESIA N/A 08/24/2019   Procedure: MRI WITH ANESTHESIA CERVICAL SPINE;  Surgeon: Radiologist, Medication, MD;  Location: [facility] OR;  Service: Radiology;  Laterality: N/A;   RADIOLOGY WITH ANESTHESIA N/A 10/02/2019   Procedure: MRI WITH ANESTHESIA CERVICAL WITHOUT CONTRAST;  Surgeon: Radiologist, Medication, MD;  Location: [facility] OR;  Service: Radiology;  Laterality: N/A;   RECTAL EXAM UNDER ANESTHESIA N/A 11/30/2023   Procedure: EXAM UNDER ANESTHESIA, RECTUM;  Surgeon: Teresa Lonni HERO, MD;  Location: [facility];  Service: General;  Laterality: N/A;   TUBAL LIGATION  1988   Social History   Occupational History   Not on file  Tobacco Use   Smoking status: Some Days    Current packs/day: 0.25    Average packs/day:  0.3 packs/day for 38.0 years (9.5 ttl pk-yrs)    Types: Cigarettes   Smokeless tobacco: Never   Tobacco comments:    1 cigs/day 08/17/2022 km,cma  Vaping Use   Vaping status: Never Used  Substance and Sexual Activity   Alcohol use: Yes    Alcohol/week: 4.0 standard drinks of alcohol    Types: 4 Cans of beer per week   Drug use: Not Currently   Sexual activity: Not Currently    Birth control/protection: Surgical    Comment: Hysterectomy

## 2024-02-19 NOTE — Progress Notes (Unsigned)
 Pain Scale   Average Pain 10 Patient advising she has left hip pain that is constant        +Driver, -BT, -Dye Allergies.

## 2024-02-22 ENCOUNTER — Other Ambulatory Visit: Payer: Self-pay | Admitting: Physical Medicine and Rehabilitation

## 2024-02-22 ENCOUNTER — Telehealth: Payer: Self-pay | Admitting: Physical Medicine and Rehabilitation

## 2024-02-22 DIAGNOSIS — M5416 Radiculopathy, lumbar region: Secondary | ICD-10-CM

## 2024-02-22 MED ORDER — TRIAMCINOLONE ACETONIDE 40 MG/ML IJ SUSP
40.0000 mg | INTRAMUSCULAR | Status: AC | PRN
  Administered 2024-02-19: 40 mg via INTRA_ARTICULAR

## 2024-02-22 MED ORDER — BUPIVACAINE HCL 0.25 % IJ SOLN
5.0000 mL | INTRAMUSCULAR | Status: AC | PRN
  Administered 2024-02-19: 5 mL via INTRA_ARTICULAR

## 2024-02-22 NOTE — Telephone Encounter (Signed)
 Patient called. Says the shot did not work.

## 2024-02-26 ENCOUNTER — Other Ambulatory Visit: Payer: Self-pay

## 2024-02-26 ENCOUNTER — Other Ambulatory Visit: Payer: Self-pay | Admitting: Family Medicine

## 2024-02-26 DIAGNOSIS — E538 Deficiency of other specified B group vitamins: Secondary | ICD-10-CM

## 2024-02-27 ENCOUNTER — Other Ambulatory Visit (HOSPITAL_COMMUNITY): Payer: Self-pay

## 2024-02-27 MED ORDER — FOLIC ACID 1 MG PO TABS
1.0000 mg | ORAL_TABLET | Freq: Every day | ORAL | 0 refills | Status: DC
  Filled 2024-02-27: qty 90, 90d supply, fill #0

## 2024-02-28 ENCOUNTER — Ambulatory Visit (INDEPENDENT_AMBULATORY_CARE_PROVIDER_SITE_OTHER): Admitting: Emergency Medicine

## 2024-02-28 ENCOUNTER — Telehealth: Payer: Self-pay

## 2024-02-28 ENCOUNTER — Encounter: Payer: Self-pay | Admitting: Emergency Medicine

## 2024-02-28 VITALS — BP 130/88 | HR 86 | Temp 98.5°F | Ht 59.0 in | Wt 140.0 lb

## 2024-02-28 DIAGNOSIS — Z1231 Encounter for screening mammogram for malignant neoplasm of breast: Secondary | ICD-10-CM

## 2024-02-28 DIAGNOSIS — R6889 Other general symptoms and signs: Secondary | ICD-10-CM

## 2024-02-28 DIAGNOSIS — B349 Viral infection, unspecified: Secondary | ICD-10-CM

## 2024-02-28 DIAGNOSIS — F411 Generalized anxiety disorder: Secondary | ICD-10-CM

## 2024-02-28 LAB — POCT INFLUENZA A/B
Influenza A, POC: NEGATIVE
Influenza B, POC: NEGATIVE

## 2024-02-28 LAB — POC COVID19 BINAXNOW: SARS Coronavirus 2 Ag: NEGATIVE

## 2024-02-28 MED ORDER — DIAZEPAM 5 MG PO TABS: ORAL_TABLET | ORAL | 0 refills | Status: AC

## 2024-02-28 NOTE — Progress Notes (Signed)
 [name] 63 y.o.   Chief Complaint  Patient presents with   Headache    Patient states having chills, body aches, head ache, slight cough. Started Wednesday she has only took tylenol . She did not do an at home covid test.     HISTORY OF PRESENT ILLNESS: Acute problem visit today. This is a 63 y.o. female complaining of flulike symptoms that started yesterday morning Acute onset with chills and generalized achiness Boyfriend was also sick last Tuesday  Headache  Associated symptoms include a fever. Pertinent negatives include no abdominal pain, coughing, nausea or vomiting.     Prior to Admission medications   Medication Sig Start Date End Date Taking? Authorizing Provider  albuterol  (VENTOLIN  HFA) 108 (90 Base) MCG/ACT inhaler Inhale 2 puffs into the lungs every 6 (six) hours as needed for wheezing or shortness of breath. 04/25/23  Yes Elnor Lauraine BRAVO, NP  amLODipine  (NORVASC ) 5 MG tablet Take 1 tablet (5 mg total) by mouth daily. 01/21/24  Yes Henson, Vickie L, NP-C  cyclobenzaprine  (FLEXERIL ) 10 MG tablet Take 1 tablet (10 mg total) by mouth at bedtime. 01/14/24  Yes Williams, Megan E, NP  diclofenac  Sodium (VOLTAREN ) 1 % GEL Apply 2 g topically 4 (four) times daily. 03/15/22  Yes Henson, Vickie L, NP-C  diltiazem  2 % GEL Please apply 5 times daily 10/05/22  Yes Abran Norleen SAILOR, MD  ferrous sulfate  325 (65 FE) MG tablet Take 325 mg by mouth daily.   Yes [provider]  folic acid  (FOLVITE ) 1 MG tablet Take 1 tablet (1 mg total) by mouth daily. 02/27/24  Yes Henson, Vickie L, NP-C  HYDROcodone -acetaminophen  (NORCO/VICODIN) 5-325 MG tablet Take 1 tablet by mouth every 8 (eight) hours as needed for moderate pain (pain score 4-6). 02/19/24  Yes Williams, Megan E, NP  lidocaine  (XYLOCAINE ) 5 % ointment APPLY 1 APPLICATION TOPICALLY TWICE DAILY AS NEEDED 01/14/24  Yes Kennedy-Smith, Colleen M, NP  lipase/protease/amylase (CREON ) 36000 UNITS CPEP capsule Take 3 capsules (108,000  Units total) by mouth 3 (three) times daily with meals AND 2 capsules (72,000 Units total) with snacks. Patient taking differently: Take 4 capsules by mouth 3 times daily with meals AND 2 capsules with snacks. 10/05/22  Yes Abran Norleen SAILOR, MD  meloxicam  (MOBIC ) 15 MG tablet Take 1 tablet by mouth once daily 02/07/24  Yes Williams, Megan E, NP  nicotine  (NICODERM CQ  - DOSED IN MG/24 HOURS) 14 mg/24hr patch Place 1 patch (14 mg total) onto the skin daily. 01/25/24  Yes Henson, Vickie L, NP-C  nicotine  polacrilex (NICORETTE ) 4 MG gum Take 1 each (4 mg total) by mouth as needed for smoking cessation. 01/31/24  Yes Henson, Vickie L, NP-C  Omega 3 1000 MG CAPS Take 2 g by mouth daily.   Yes [provider]  omega-3 acid ethyl esters (LOVAZA ) 1 g capsule Take 2 capsules (2 g total) by mouth 2 (two) times daily. 01/25/24  Yes Henson, Vickie L, NP-C  ondansetron  (ZOFRAN -ODT) 8 MG disintegrating tablet Take 1 tablet (8 mg total) by mouth every 8 (eight) hours as needed for nausea or vomiting. 08/06/23  Yes Amyot, Jenkins Lesches, NP  pantoprazole  (PROTONIX ) 40 MG tablet Take 1 tablet (40 mg total) by mouth 2 (two) times daily. Take 30 minutes before breakfast. 12/24/23  Yes Kennedy-Smith, Colleen M, NP  potassium chloride  SA (KLOR-CON  M) 20 MEQ tablet Take 1 tablet (20 mEq total) by mouth daily. 01/29/24  Yes Henson, Vickie L, NP-C  prochlorperazine  (COMPAZINE )  10 MG tablet Take 1 tablet (10 mg total) by mouth 2 (two) times daily as needed for nausea or vomiting. 08/09/23  Yes Roemhildt, Lorin T, PA-C  traMADol  (ULTRAM ) 50 MG tablet Take 1 tablet (50 mg total) by mouth every 8 (eight) hours as needed. 01/28/24  Yes Williams, Megan E, NP  Vitamin D , Ergocalciferol , (DRISDOL ) 1.25 MG (50000 UNIT) CAPS capsule Take 1 capsule (50,000 Units total) by mouth every 7 (seven) days. 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   Cough due to ACE inhibitor 05/16/2022   Myalgia 05/16/2022   Acute bronchitis 05/16/2022   Hypomagnesemia 03/19/2022   Vitamin B 12 deficiency 03/19/2022   Low TSH level 03/19/2022   Right elbow pain 03/19/2022   Encounter for screening mammogram for malignant neoplasm of breast 03/19/2022   History of hypertension 12/13/2021   History of smoking 12/13/2021   Elevated blood pressure reading 12/13/2021   Contusion of right knee 12/13/2021   Fall 12/13/2021   Muscle cramps 12/04/2021   Elevated liver enzymes 12/04/2021   Paresthesias 12/04/2021   Elevated LFTs 12/04/2021   Anemia 10/20/2021   Chronic foot pain 10/20/2021   Folic acid  deficiency 09/19/2021   Chronic pain syndrome 09/07/2021   Status post lumbar spine surgery for decompression of spinal cord 05/24/2021   Dyspepsia 05/23/2021   History of gastroesophageal reflux (GERD) 05/23/2021   History of lumbar laminectomy for spinal cord decompression 04/26/2021   Lumbar stenosis 04/13/2021   Tension headache 12/22/2020   Low back pain 10/20/2020   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/28/2020   Hypokalemia 12/17/2019   Chronic diarrhea 09/14/2017   History of stroke 06/12/2014   Cervical radiculitis 02/12/2014   Essential hypertension, benign 01/24/2010   Microcytic anemia 01/11/2010   Hyperlipemia 05/28/2007   Current smoker 05/28/2007   GERD 05/28/2007    Past Medical History:  Diagnosis Date   Anemia    Arthritis    neck, hips (04/25/2018)   Claustrophobia    Daily headache    no  longer a problem   GERD (gastroesophageal reflux disease)    Hiatal hernia    Hyperlipidemia    hx (04/25/2018)   Hypertension    Plantar fasciitis    hx - left foot   Pneumonia    Stroke (HCC) 05/2014   mini stroke (04/25/2018)    Past Surgical History:  Procedure Laterality Date   ABDOMINAL HYSTERECTOMY  12/09   Secondary to fibroids   ARTERY BIOPSY Right 04/10/2018   Procedure: BIOPSY TEMPORAL ARTERY;  Surgeon: Oris Krystal FALCON, MD;  Location: [facility];  Service: Vascular;  Laterality: Right;   CESAREAN SECTION  1986; 1988   COLONOSCOPY     CONDYLOMA EXCISION/FULGURATION N/A 11/30/2023   Procedure: REMOVAL, CONDYLOMA;  Surgeon: Teresa Lonni HERO, MD;  Location: [facility] ORS;  Service: General;  Laterality: N/A;  Excision of hypertrophic anal papilla   FOOT  SURGERY Left 03/2017   Bone Spurs Removed   INJECTION, CHEMODENERVATION AGENT N/A 11/30/2023   Procedure: INJECTION, CHEMODENERVATION AGENT;  Surgeon: Teresa Lonni HERO, MD;  Location: [facility] ORS;  Service: General;  Laterality: N/A;   LUMBAR LAMINECTOMY/DECOMPRESSION MICRODISCECTOMY N/A 04/13/2021   Procedure: LUMBAR FOUR- LUMBAR FIVE AND LUMBAR FIVE-SACRAL ONE  DECOMPRESSION;  Surgeon: Barbarann Oneil BROCKS, MD;  Location: [facility] OR;  Service: Orthopedics;  Laterality: N/A;   RADIOLOGY WITH ANESTHESIA N/A 04/26/2018   Procedure: MRI WITH ANESTHESIA;  Surgeon: Radiologist, Medication, MD;  Location: [facility] OR;  Service: Radiology;  Laterality: N/A;   RADIOLOGY WITH ANESTHESIA N/A 08/24/2019   Procedure: MRI WITH ANESTHESIA CERVICAL SPINE;  Surgeon: Radiologist, Medication, MD;  Location: [facility] OR;  Service: Radiology;  Laterality: N/A;   RADIOLOGY WITH ANESTHESIA N/A 10/02/2019   Procedure: MRI WITH ANESTHESIA CERVICAL WITHOUT CONTRAST;  Surgeon: Radiologist, Medication, MD;  Location: [facility] OR;  Service: Radiology;  Laterality: N/A;   RECTAL EXAM UNDER ANESTHESIA N/A 11/30/2023   Procedure: EXAM UNDER ANESTHESIA, RECTUM;  Surgeon: Teresa Lonni HERO, MD;   Location: [facility] ORS;  Service: General;  Laterality: N/A;   TUBAL LIGATION  1988    Social History   Socioeconomic History   Marital status: Divorced    Spouse name: Not on file   Number of children: 2   Years of education: Not on file   Highest education level: Not on file  Occupational History   Not on file  Tobacco Use   Smoking status: Some Days    Current packs/day: 0.25    Average packs/day: 0.3 packs/day for 38.0 years (9.5 ttl pk-yrs)    Types: Cigarettes   Smokeless tobacco: Never   Tobacco comments:    1 cigs/day 08/17/2022 km,cma  Vaping Use   Vaping status: Never Used  Substance and Sexual Activity   Alcohol use: Yes    Alcohol/week: 4.0 standard drinks of alcohol    Types: 4 Cans of beer per week   Drug use: Not Currently   Sexual activity: Not Currently    Birth control/protection: Surgical    Comment: Hysterectomy  Other Topics Concern   Not on file  Social History Narrative   10 th grade education.   Social Drivers of Corporate investment banker Strain: Not on file  Food Insecurity: Not on file  Transportation Needs: No Transportation Needs (08/27/2019)   PRAPARE - Administrator, Civil Service (Medical): No    Lack of Transportation (Non-Medical): No  Physical Activity: Not on file  Stress: Not on file  Social Connections: Not on file  Intimate Partner Violence: Not on file    Family History  Problem Relation Age of Onset   Pancreatic cancer Mother 98   Heart disease Mother    Esophageal cancer Father 73   Heart disease Sister    Breast cancer Maternal Aunt 60   Colon cancer Maternal Uncle    Pancreatic cancer Maternal Uncle    Colon cancer Paternal Uncle    Rectal cancer Neg Hx    Stomach cancer Neg Hx      Review of Systems  Constitutional:  Positive for chills and fever.  HENT:  Positive for congestion.   Respiratory: Negative.  Negative for cough and shortness of breath.   Cardiovascular: Negative.  Negative for chest  pain and palpitations.  Gastrointestinal:  Negative for abdominal pain, nausea and vomiting.  Skin: Negative.  Negative for rash.  Neurological:  Positive for headaches.  All  other systems reviewed and are negative.   Vitals:   02/28/24 1343  BP: 130/88  Pulse: 86  Temp: 98.5 F (36.9 C)  SpO2: 98%    Physical Exam Vitals reviewed.  Constitutional:      Appearance: Normal appearance.  HENT:     Head: Normocephalic.     Mouth/Throat:     Mouth: Mucous membranes are moist.     Pharynx: Oropharynx is clear.  Eyes:     Extraocular Movements: Extraocular movements intact.     Pupils: Pupils are equal, round, and reactive to light.  Cardiovascular:     Rate and Rhythm: Normal rate and regular rhythm.     Pulses: Normal pulses.     Heart sounds: Normal heart sounds.  Pulmonary:     Effort: Pulmonary effort is normal.     Breath sounds: Normal breath sounds.  Musculoskeletal:     Cervical back: No tenderness.  Lymphadenopathy:     Cervical: No cervical adenopathy.  Skin:    General: Skin is warm and dry.  Neurological:     Mental Status: She is alert and oriented to person, place, and time.  Psychiatric:        Mood and Affect: Mood normal.        Behavior: Behavior normal.    Results for orders placed or performed in visit on 02/28/24 (from the past 24 hours)  POC COVID-19     Status: None   Collection Time: 02/28/24  2:07 PM  Result Value Ref Range   SARS Coronavirus 2 Ag Negative Negative  POCT Influenza A/B     Status: None   Collection Time: 02/28/24  2:07 PM  Result Value Ref Range   Influenza A, POC Negative Negative   Influenza B, POC Negative Negative   *Note: Due to a large number of results and/or encounters for the requested time period, some results have not been displayed. A complete set of results can be found in Results Review.     ASSESSMENT & PLAN: Problem List Items Addressed This Visit       Other   Flu-like symptoms   Symptom  management discussed. Advised to rest and stay well-hydrated ED precautions given Advised to contact the office if no better or worse during the next several days      Relevant Orders   POC COVID-19 (Completed)   POCT Influenza A/B (Completed)   Viral illness - Primary   Clinically stable.  No red flag signs or symptoms Negative COVID and influenza tests Symptom management discussed Advised to rest and stay well-hydrated Advised to contact the office if no better or worse during the next several days      Patient Instructions  Viral Illness, Adult Viruses are tiny germs that can get into a person's body and cause illness. There are many different types of viruses. And they cause many types of illness. Viral illnesses can range from mild to severe. They can affect various parts of the body. Short-term conditions that are caused by a virus include colds and flu (influenza) and stomach viruses. Long-term conditions that are caused by a virus include herpes, shingles, and human immunodeficiency virus (HIV) infection. A few viruses have been linked to certain cancers. What are the causes? Many types of viruses can cause illness. Viruses get into cells in your body, multiply, and cause the infected cells to work differently or die. When these cells die, they release more of the virus. When this happens, you get  symptoms of the illness and the virus spreads to other cells. If the virus takes over how the cell works, it can cause the cell to divide and grow out of control. This happens when a virus causes cancer. Different viruses get into the body in different ways. You can get a virus by: Swallowing food or water that has come in contact with the virus. Breathing in droplets that have been coughed or sneezed into the air by an infected person. Touching a surface that has the virus on it and then touching your eyes, nose, or mouth. Being bitten by an insect or animal that carries the  virus. Having sexual contact with a person who is infected with the virus. Being exposed to blood or fluids that contain the virus, either through an open cut or during a transfusion. If a virus enters your body, your body's disease-fighting system (immune system) will try to fight the virus. You may be at higher risk for a viral illness if your immune system is weak. What are the signs or symptoms? Symptoms depend on the type of virus and the location of the cells that it gets into. Symptoms can include: For cold and flu viruses: Fever. Headache. Sore throat. Muscle aches. Stuffy nose (nasal congestion). Cough. For stomach (gastrointestinal) viruses: Fever. Pain in the abdomen. Nausea or vomiting. Diarrhea. For liver viruses (hepatitis): Loss of appetite. Feeling tired. Skin or the white parts of your eyes turning yellow (jaundice). For brain and spinal cord viruses: Fever. Headache. Stiff neck. Nausea and vomiting. Confusion or being sleepy. For skin viruses: Warts. Itching. Rash. For sexually transmitted viruses: Discharge. Swelling. Redness. Rash. How is this diagnosed? This condition may be diagnosed based on one or more of these: Your symptoms and medical history. A physical exam. Tests, such as: Blood tests. Tests on a sample of mucus from your lungs (sputum sample). Tests on a poop (stool) sample. Tests on a swab of body fluids or a skin sore (lesion). How is this treated? Viruses can be hard to treat because they live within cells. Antibiotics do not treat viruses because these medicines do not get inside cells. Treatment for a viral illness may include: Resting and drinking a lot of fluids. Medicines to treat symptoms. These can include over-the-counter medicine for pain and fever, medicines for cough or congestion, and medicines for diarrhea. Antiviral medicines. These medicines are available only for certain types of viruses. Some viral illnesses can be  prevented with vaccinations. A common example is the flu shot. Follow these instructions at home: Medicines Take over-the-counter and prescription medicines only as told by your health care provider. If you were prescribed an antiviral medicine, take it as told by your provider. Do not stop taking the antiviral even if you start to feel better. Know when antibiotics are needed and when they are not needed. Antibiotics do not treat viruses. You may get an antibiotic if your provider thinks that you may have, or are at risk for, a bacterial infection and you have a viral infection. Do not ask for an antibiotic prescription if you have been diagnosed with a viral illness. Antibiotics will not make your illness go away faster. Taking antibiotics when they are not needed can lead to antibiotic resistance. When this develops, the medicine no longer works against the bacteria that it normally fights. General instructions Drink enough fluids to keep your pee (urine) pale yellow. Rest as much as possible. Return to your normal activities as told by your provider.  Ask your provider what activities are safe for you. How is this prevented? To lower your risk of getting another viral illness: Wash your hands often with soap and water for at least 20 seconds. If soap and water are not available, use hand sanitizer. Avoid touching your nose, eyes, and mouth, especially if you have not washed your hands recently. If anyone in your household has a viral infection, clean all household surfaces that may have been in contact with the virus. Use soap and hot water. You may also use a commercially prepared, bleach-containing solution. Stay away from people who are sick with symptoms of a viral infection. Do not share items such as toothbrushes and water bottles with other people. Keep your vaccinations up to date. This includes getting a yearly flu shot. Eat a healthy diet and get plenty of rest. Contact a health  care provider if: You have symptoms of a viral illness that do not go away. Your symptoms come back after going away. Your symptoms get worse. Get help right away if: You have trouble breathing. You have a severe headache or a stiff neck. You have severe vomiting or pain in your abdomen. These symptoms may be an emergency. Get help right away. Call 911. Do not wait to see if the symptoms will go away. Do not drive yourself to the hospital. This information is not intended to replace advice given to you by your health care provider. Make sure you discuss any questions you have with your health care provider. Document Revised: 06/28/2022 Document Reviewed: 04/12/2022 Elsevier Patient Education  2024 [employer]    Emil Schaumann, MD  Primary Care at [facility]

## 2024-02-28 NOTE — Patient Instructions (Signed)
 Viral Illness, Adult Viruses are tiny germs that can get into a person's body and cause illness. There are many different types of viruses. And they cause many types of illness. Viral illnesses can range from mild to severe. They can affect various parts of the body. Short-term conditions that are caused by a virus include colds and flu (influenza) and stomach viruses. Long-term conditions that are caused by a virus include herpes, shingles, and human immunodeficiency virus (HIV) infection. A few viruses have been linked to certain cancers. What are the causes? Many types of viruses can cause illness. Viruses get into cells in your body, multiply, and cause the infected cells to work differently or die. When these cells die, they release more of the virus. When this happens, you get symptoms of the illness and the virus spreads to other cells. If the virus takes over how the cell works, it can cause the cell to divide and grow out of control. This happens when a virus causes cancer. Different viruses get into the body in different ways. You can get a virus by: Swallowing food or water that has come in contact with the virus. Breathing in droplets that have been coughed or sneezed into the air by an infected person. Touching a surface that has the virus on it and then touching your eyes, nose, or mouth. Being bitten by an insect or animal that carries the virus. Having sexual contact with a person who is infected with the virus. Being exposed to blood or fluids that contain the virus, either through an open cut or during a transfusion. If a virus enters your body, your body's disease-fighting system (immune system) will try to fight the virus. You may be at higher risk for a viral illness if your immune system is weak. What are the signs or symptoms? Symptoms depend on the type of virus and the location of the cells that it gets into. Symptoms can include: For cold and flu  viruses: Fever. Headache. Sore throat. Muscle aches. Stuffy nose (nasal congestion). Cough. For stomach (gastrointestinal) viruses: Fever. Pain in the abdomen. Nausea or vomiting. Diarrhea. For liver viruses (hepatitis): Loss of appetite. Feeling tired. Skin or the white parts of your eyes turning yellow (jaundice). For brain and spinal cord viruses: Fever. Headache. Stiff neck. Nausea and vomiting. Confusion or being sleepy. For skin viruses: Warts. Itching. Rash. For sexually transmitted viruses: Discharge. Swelling. Redness. Rash. How is this diagnosed? This condition may be diagnosed based on one or more of these: Your symptoms and medical history. A physical exam. Tests, such as: Blood tests. Tests on a sample of mucus from your lungs (sputum sample). Tests on a poop (stool) sample. Tests on a swab of body fluids or a skin sore (lesion). How is this treated? Viruses can be hard to treat because they live within cells. Antibiotics do not treat viruses because these medicines do not get inside cells. Treatment for a viral illness may include: Resting and drinking a lot of fluids. Medicines to treat symptoms. These can include over-the-counter medicine for pain and fever, medicines for cough or congestion, and medicines for diarrhea. Antiviral medicines. These medicines are available only for certain types of viruses. Some viral illnesses can be prevented with vaccinations. A common example is the flu shot. Follow these instructions at home: Medicines Take over-the-counter and prescription medicines only as told by your health care provider. If you were prescribed an antiviral medicine, take it as told by your provider. Do not stop  taking the antiviral even if you start to feel better. Know when antibiotics are needed and when they are not needed. Antibiotics do not treat viruses. You may get an antibiotic if your provider thinks that you may have, or are at risk  for, a bacterial infection and you have a viral infection. Do not ask for an antibiotic prescription if you have been diagnosed with a viral illness. Antibiotics will not make your illness go away faster. Taking antibiotics when they are not needed can lead to antibiotic resistance. When this develops, the medicine no longer works against the bacteria that it normally fights. General instructions Drink enough fluids to keep your pee (urine) pale yellow. Rest as much as possible. Return to your normal activities as told by your provider. Ask your provider what activities are safe for you. How is this prevented? To lower your risk of getting another viral illness: Wash your hands often with soap and water for at least 20 seconds. If soap and water are not available, use hand sanitizer. Avoid touching your nose, eyes, and mouth, especially if you have not washed your hands recently. If anyone in your household has a viral infection, clean all household surfaces that may have been in contact with the virus. Use soap and hot water. You may also use a commercially prepared, bleach-containing solution. Stay away from people who are sick with symptoms of a viral infection. Do not share items such as toothbrushes and water bottles with other people. Keep your vaccinations up to date. This includes getting a yearly flu shot. Eat a healthy diet and get plenty of rest. Contact a health care provider if: You have symptoms of a viral illness that do not go away. Your symptoms come back after going away. Your symptoms get worse. Get help right away if: You have trouble breathing. You have a severe headache or a stiff neck. You have severe vomiting or pain in your abdomen. These symptoms may be an emergency. Get help right away. Call 911. Do not wait to see if the symptoms will go away. Do not drive yourself to the hospital. This information is not intended to replace advice given to you by your health  care provider. Make sure you discuss any questions you have with your health care provider. Document Revised: 06/28/2022 Document Reviewed: 04/12/2022 Elsevier Patient Education  2024 [employer].

## 2024-02-28 NOTE — Assessment & Plan Note (Signed)
 Symptom management discussed. Advised to rest and stay well-hydrated ED precautions given Advised to contact the office if no better or worse during the next several days

## 2024-02-28 NOTE — Assessment & Plan Note (Signed)
 Clinically stable.  No red flag signs or symptoms Negative COVID and influenza tests Symptom management discussed Advised to rest and stay well-hydrated Advised to contact the office if no better or worse during the next several days

## 2024-02-28 NOTE — Telephone Encounter (Signed)
 Patient requesting pre procedure medication.

## 2024-02-28 NOTE — Addendum Note (Signed)
 Addended by: ELDONNA GARDINER POUR on: 02/28/2024 02:27 PM   Modules accepted: Orders

## 2024-02-29 ENCOUNTER — Ambulatory Visit

## 2024-02-29 DIAGNOSIS — E538 Deficiency of other specified B group vitamins: Secondary | ICD-10-CM

## 2024-02-29 MED ORDER — CYANOCOBALAMIN 1000 MCG/ML IJ SOLN
1000.0000 ug | Freq: Once | INTRAMUSCULAR | Status: AC
Start: 2024-02-29 — End: 2024-02-29
  Administered 2024-02-29: 1000 ug via INTRAMUSCULAR

## 2024-02-29 NOTE — Progress Notes (Signed)
 Pt here for monthly B12 injection per Boby Mackintosh, NP  B12 1000mcg given IM and pt tolerated injection well.  Next B12 injection scheduled for NEXT MONTH for her monthly B12 injection.

## 2024-03-02 ENCOUNTER — Ambulatory Visit (INDEPENDENT_AMBULATORY_CARE_PROVIDER_SITE_OTHER)

## 2024-03-02 ENCOUNTER — Ambulatory Visit (HOSPITAL_COMMUNITY)
Admission: EM | Admit: 2024-03-02 | Discharge: 2024-03-02 | Disposition: A | Discharge status: Home / Self Care | Attending: Emergency Medicine | Admitting: Emergency Medicine

## 2024-03-02 ENCOUNTER — Encounter (HOSPITAL_COMMUNITY): Payer: Self-pay

## 2024-03-02 DIAGNOSIS — R6889 Other general symptoms and signs: Secondary | ICD-10-CM

## 2024-03-02 DIAGNOSIS — J22 Unspecified acute lower respiratory infection: Secondary | ICD-10-CM

## 2024-03-02 DIAGNOSIS — R0989 Other specified symptoms and signs involving the circulatory and respiratory systems: Secondary | ICD-10-CM | POA: Diagnosis not present

## 2024-03-02 LAB — POC COVID19/FLU A&B COMBO
Covid Antigen, POC: NEGATIVE
Influenza A Antigen, POC: NEGATIVE
Influenza B Antigen, POC: NEGATIVE

## 2024-03-02 MED ORDER — DEXAMETHASONE SODIUM PHOSPHATE 10 MG/ML IJ SOLN
10.0000 mg | Freq: Once | INTRAMUSCULAR | Status: AC
  Administered 2024-03-02: 10 mg via INTRAMUSCULAR

## 2024-03-02 MED ORDER — AMOXICILLIN-POT CLAVULANATE 875-125 MG PO TABS: 1.0000 | ORAL_TABLET | Freq: Two times a day (BID) | ORAL | 0 refills | Status: AC

## 2024-03-02 MED ORDER — ALBUTEROL SULFATE HFA 108 (90 BASE) MCG/ACT IN AERS: 2.0000 | INHALATION_SPRAY | Freq: Four times a day (QID) | RESPIRATORY_TRACT | 2 refills | Status: AC | PRN

## 2024-03-02 MED ORDER — AZITHROMYCIN 250 MG PO TABS
ORAL_TABLET | ORAL | 0 refills | Status: AC
Start: 2024-03-02 — End: 2024-03-07

## 2024-03-02 MED ORDER — IPRATROPIUM-ALBUTEROL 0.5-2.5 (3) MG/3ML IN SOLN
RESPIRATORY_TRACT | Status: AC
  Filled 2024-03-02: qty 3

## 2024-03-02 MED ORDER — GUAIFENESIN 400 MG PO TABS: ORAL_TABLET | ORAL | 0 refills | Status: AC

## 2024-03-02 MED ORDER — DEXAMETHASONE SODIUM PHOSPHATE 10 MG/ML IJ SOLN
INTRAMUSCULAR | Status: AC
  Filled 2024-03-02: qty 1

## 2024-03-02 MED ORDER — IPRATROPIUM-ALBUTEROL 0.5-2.5 (3) MG/3ML IN SOLN
3.0000 mL | Freq: Once | RESPIRATORY_TRACT | Status: AC
  Administered 2024-03-02: 3 mL via RESPIRATORY_TRACT

## 2024-03-02 MED ORDER — PROMETHAZINE-DM 6.25-15 MG/5ML PO SYRP: 5.0000 mL | ORAL_SOLUTION | Freq: Every evening | ORAL | 0 refills | Status: AC | PRN

## 2024-03-02 NOTE — Discharge Instructions (Signed)
 Your chest x-ray is not overly concerning for pneumonia, based on physical exam findings I do believe that you should be treated for pneumonia with antibiotics.  I believe that you will have some significant improvement with this.  Please read below to learn more about the medications, dosages and frequencies that I recommend to help alleviate your symptoms and to get you feeling better soon:   Augmentin  (amoxicillin  - clavulanic acid):  Please take one (1) dose twice daily for 5 days.  This antibiotic can cause upset stomach, this will resolve once antibiotics are complete.  You are welcome to take a probiotic, eat yogurt, take Imodium  while taking this medication.  Please avoid other systemic medications such as Maalox, Pepto-Bismol or milk of magnesia as they can interfere with the body's ability to absorb the antibiotics.    Z-Pak (azithromycin ):  Please take two (2) tablets on day one and one tablet daily thereafter until the prescription is complete.This antibiotic can cause upset stomach, this will resolve once antibiotics are complete.  You are welcome to take a probiotic, eat yogurt, take Imodium  while taking this medication.  Please avoid other systemic medications such as Maalox, Pepto-Bismol or milk of magnesia as they can interfere with the body's ability to absorb the antibiotics.   ProAir , Ventolin , Proventil  (albuterol ): This inhaled medication contains a short acting beta agonist bronchodilator.  This medication relaxes the smooth muscle of the airway in the lungs.  When these muscles are tight, breathing becomes more constricted.  The result of relaxation of the smooth muscle is increased air movement and improved work of breathing.  This is a short acting medication that can be used every 4-6 hours as needed for increased work of breathing, shortness of breath, wheezing and excessive coughing.  It comes in the form of a handheld inhaler or nebulizer solution.  I recommended that for the  next 3 to 4 days, this medication is used 4 times daily on a scheduled basis then decrease to twice daily and as needed until symptoms have completely resolved which I anticipate will be several weeks.   Robitussin, Mucinex  (guaifenesin ): This is a daytime expectorant.  This single symptom reliever helps break up chest congestion and loosen up thick nasal drainage making phlegm and drainage easier to cough up and to blow out from your nose.  I recommend taking 400 mg in either liquid or tablet form three times daily as needed.  I do not recommend the 12-hour extended relief version or doses higher than 400 mg per each dose as these often make some patients feel jittery or jumpy and can interfere with sleep.  I also do not recommend that you purchase guaifenesin  with the ingredient  DM which is dextromethorphan, a cough suppressant which I only recommend taking at bedtime.  Guaifenesin  400 mg is a safe dose for people who are being treated for high blood pressure.     If symptoms have not meaningfully improved in the next 3 to 5 days, please return for repeat evaluation or follow-up with your regular provider.  If symptoms have worsened in the next 3 to 5 days, please go to the emergency room for further evaluation.    Thank you for visiting urgent care today.  We appreciate the opportunity to participate in your care.

## 2024-03-02 NOTE — ED Provider Notes (Signed)
 [facility]    CSN: [id number] Arrival date & time: 03/02/24  1150    HISTORY   Chief Complaint  Patient presents with   Cough   Nasal Congestion   HPI [name] is a pleasant, 63 y.o. female who presents to urgent care today. Patient complains of nasal congestion and a nonproductive cough that makes her feel short of breath for the past 2 days.  Patient states she had a negative COVID and flu test at her primary care provider's office 2 days ago.  Patient states at that visit she also received her flu shot.  Patient states that since being seen she has had significant worsening of nasal congestion, rhinorrhea and cough.  Patient states has not tried thing to alleviate her symptoms.  Patient denies fever, body aches, chills, nausea, vomiting, diarrhea, known sick contacts.  Patient is with her husband today who states he has been feeling well himself.  Patient states she works in [employer] and is around children all day.  Patient denies history of allergies, asthma, endorses history of bronchitis in the past.  The history is provided by the patient.  Cough  Past Medical History:  Diagnosis Date   Anemia    Arthritis    neck, hips (04/25/2018)   Claustrophobia    Daily headache    no longer a problem   GERD (gastroesophageal reflux disease)    Hiatal hernia    Hyperlipidemia    hx (04/25/2018)   Hypertension    Plantar fasciitis    hx - left foot   Pneumonia    Stroke (HCC) 05/2014   mini stroke (04/25/2018)   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   Cough due to  ACE inhibitor 05/16/2022   Myalgia 05/16/2022   Hypomagnesemia 03/19/2022   Vitamin B 12 deficiency 03/19/2022   Low TSH level 03/19/2022   Right elbow pain 03/19/2022   Encounter for screening mammogram for malignant neoplasm of breast 03/19/2022   History of hypertension 12/13/2021   History of smoking 12/13/2021   Elevated blood pressure reading 12/13/2021   Contusion of right knee 12/13/2021   Fall 12/13/2021   Muscle cramps 12/04/2021   Elevated liver enzymes 12/04/2021   Paresthesias 12/04/2021   Elevated LFTs 12/04/2021   Anemia 10/20/2021   Chronic foot pain 10/20/2021   Folic acid  deficiency 09/19/2021   Chronic pain syndrome 09/07/2021   Status post lumbar spine surgery for decompression of spinal cord 05/24/2021   Dyspepsia 05/23/2021   History of gastroesophageal reflux (GERD) 05/23/2021   History of lumbar laminectomy for spinal cord decompression 04/26/2021   Lumbar stenosis 04/13/2021   Tension headache 12/22/2020   Low back pain 10/20/2020   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/28/2020   Hypokalemia 12/17/2019   Chronic diarrhea 09/14/2017   History of stroke 06/12/2014   Essential hypertension, benign 01/24/2010   Microcytic anemia 01/11/2010   Hyperlipemia 05/28/2007   Current smoker 05/28/2007   GERD 05/28/2007   Past Surgical History:  Procedure Laterality Date   ABDOMINAL HYSTERECTOMY  12/09   Secondary to fibroids   ARTERY BIOPSY Right  04/10/2018   Procedure: BIOPSY TEMPORAL ARTERY;  Surgeon: Oris Krystal FALCON, MD;  Location: [facility] OR;  Service: Vascular;  Laterality: Right;   CESAREAN SECTION  1986; 1988   COLONOSCOPY     CONDYLOMA EXCISION/FULGURATION N/A 11/30/2023   Procedure: REMOVAL, CONDYLOMA;  Surgeon: Teresa Lonni HERO, MD;  Location: [facility] ORS;  Service: General;  Laterality: N/A;  Excision of hypertrophic anal papilla   FOOT SURGERY Left 03/2017   Bone Spurs Removed   INJECTION, CHEMODENERVATION AGENT N/A 11/30/2023   Procedure: INJECTION,  CHEMODENERVATION AGENT;  Surgeon: Teresa Lonni HERO, MD;  Location: [facility];  Service: General;  Laterality: N/A;   LUMBAR LAMINECTOMY/DECOMPRESSION MICRODISCECTOMY N/A 04/13/2021   Procedure: LUMBAR FOUR- LUMBAR FIVE AND LUMBAR FIVE-SACRAL ONE  DECOMPRESSION;  Surgeon: Barbarann Oneil BROCKS, MD;  Location: [facility] OR;  Service: Orthopedics;  Laterality: N/A;   RADIOLOGY WITH ANESTHESIA N/A 04/26/2018   Procedure: MRI WITH ANESTHESIA;  Surgeon: Radiologist, Medication, MD;  Location: [facility] OR;  Service: Radiology;  Laterality: N/A;   RADIOLOGY WITH ANESTHESIA N/A 08/24/2019   Procedure: MRI WITH ANESTHESIA CERVICAL SPINE;  Surgeon: Radiologist, Medication, MD;  Location: [facility] OR;  Service: Radiology;  Laterality: N/A;   RADIOLOGY WITH ANESTHESIA N/A 10/02/2019   Procedure: MRI WITH ANESTHESIA CERVICAL WITHOUT CONTRAST;  Surgeon: Radiologist, Medication, MD;  Location: [facility];  Service: Radiology;  Laterality: N/A;   RECTAL EXAM UNDER ANESTHESIA N/A 11/30/2023   Procedure: EXAM UNDER ANESTHESIA, RECTUM;  Surgeon: Teresa Lonni HERO, MD;  Location: [facility];  Service: General;  Laterality: N/A;   TUBAL LIGATION  1988   OB History   No obstetric history on file.    Home Medications    Prior to Admission medications   Medication Sig Start Date End Date Taking? Authorizing Provider  albuterol  (VENTOLIN  HFA) 108 (90 Base) MCG/ACT inhaler Inhale 2 puffs into the lungs every 6 (six) hours as needed for wheezing or shortness of breath. Patient not taking: Reported on 03/02/2024 04/25/23   Elnor Lauraine BRAVO, NP  amLODipine  (NORVASC ) 5 MG tablet Take 1 tablet (5 mg total) by mouth daily. 01/21/24   Henson, Vickie L, NP-C  cyclobenzaprine  (FLEXERIL ) 10 MG tablet Take 1 tablet (10 mg total) by mouth at bedtime. Patient not taking: Reported on 03/02/2024 01/14/24   Williams, Megan E, NP  diazepam  (VALIUM ) 5 MG tablet Take 1 by mouth 1 hour  pre-procedure with very light food. Do not drive motor vehicle. 02/28/24   Eldonna Novel, MD   diclofenac  Sodium (VOLTAREN ) 1 % GEL Apply 2 g topically 4 (four) times daily. Patient not taking: Reported on 03/02/2024 03/15/22   Lendia Nordmann L, NP-C  diltiazem  2 % GEL Please apply 5 times daily Patient not taking: Reported on 03/02/2024 10/05/22   Abran Norleen SAILOR, MD  ferrous sulfate  325 (65 FE) MG tablet Take 325 mg by mouth daily. Patient not taking: Reported on 03/02/2024    [provider]  folic acid  (FOLVITE ) 1 MG tablet Take 1 tablet (1 mg total) by mouth daily. 02/27/24   Henson, Vickie L, NP-C  HYDROcodone -acetaminophen  (NORCO/VICODIN) 5-325 MG tablet Take 1 tablet by mouth every 8 (eight) hours as needed for moderate pain (pain score 4-6). 02/19/24   Williams, Megan E, NP  lidocaine  (XYLOCAINE ) 5 % ointment APPLY 1 APPLICATION TOPICALLY TWICE DAILY AS NEEDED 01/14/24   Kennedy-Smith, Colleen M, NP  lipase/protease/amylase (CREON ) 36000 UNITS CPEP capsule Take 3 capsules (108,000 Units total) by mouth 3 (three) times daily with meals AND 2  capsules (72,000 Units total) with snacks. Patient taking differently: Take 4 capsules by mouth 3 times daily with meals AND 2 capsules with snacks. 10/05/22   Abran Norleen SAILOR, MD  meloxicam  (MOBIC ) 15 MG tablet Take 1 tablet by mouth once daily Patient not taking: Reported on 03/02/2024 02/07/24   Williams, Megan E, NP  nicotine  (NICODERM CQ  - DOSED IN MG/24 HOURS) 14 mg/24hr patch Place 1 patch (14 mg total) onto the skin daily. 01/25/24   Henson, Vickie L, NP-C  nicotine  polacrilex (NICORETTE ) 4 MG gum Take 1 each (4 mg total) by mouth as needed for smoking cessation. 01/31/24   Henson, Vickie L, NP-C  Omega 3 1000 MG CAPS Take 2 g by mouth daily.    [provider]  omega-3 acid ethyl esters (LOVAZA ) 1 g capsule Take 2 capsules (2 g total) by mouth 2 (two) times daily. 01/25/24   Henson, Vickie L, NP-C  ondansetron  (ZOFRAN -ODT) 8 MG disintegrating tablet Take 1 tablet (8 mg total) by mouth every 8 (eight) hours as needed for nausea or vomiting.  08/06/23   Amyot, Jenkins Lesches, NP  pantoprazole  (PROTONIX ) 40 MG tablet Take 1 tablet (40 mg total) by mouth 2 (two) times daily. Take 30 minutes before breakfast. 12/24/23   Kennedy-Smith, Colleen M, NP  potassium chloride  SA (KLOR-CON  M) 20 MEQ tablet Take 1 tablet (20 mEq total) by mouth daily. 01/29/24   Henson, Vickie L, NP-C  prochlorperazine  (COMPAZINE ) 10 MG tablet Take 1 tablet (10 mg total) by mouth 2 (two) times daily as needed for nausea or vomiting. 08/09/23   Roemhildt, Lorin T, PA-C  traMADol  (ULTRAM ) 50 MG tablet Take 1 tablet (50 mg total) by mouth every 8 (eight) hours as needed. Patient not taking: Reported on 03/02/2024 01/28/24   Williams, Megan E, NP  Vitamin D , Ergocalciferol , (DRISDOL ) 1.25 MG (50000 UNIT) CAPS capsule Take 1 capsule (50,000 Units total) by mouth every 7 (seven) days. 01/28/24   Lendia Boby CROME, NP-C    Family History Family History  Problem Relation Age of Onset   Pancreatic cancer Mother 86   Heart disease Mother    Esophageal cancer Father 90   Heart disease Sister    Breast cancer Maternal Aunt 60   Colon cancer Maternal Uncle    Pancreatic cancer Maternal Uncle    Colon cancer Paternal Uncle    Rectal cancer Neg Hx    Stomach cancer Neg Hx    Social History Social History   Tobacco Use   Smoking status: Some Days    Current packs/day: 0.25    Average packs/day: 0.3 packs/day for 38.0 years (9.5 ttl pk-yrs)    Types: Cigarettes   Smokeless tobacco: Never   Tobacco comments:    1 cigs/day 08/17/2022 km,cma  Vaping Use   Vaping status: Never Used  Substance Use Topics   Alcohol use: Yes    Alcohol/week: 4.0 standard drinks of alcohol    Types: 4 Cans of beer per week   Drug use: Not Currently   Allergies   Aspirin, Hydromorphone hcl, Crestor  [rosuvastatin ], and Latex  Review of Systems Review of Systems  Respiratory:  Positive for cough.    Pertinent findings revealed after performing a 14 point review of systems has been noted in the  history of present illness.  Physical Exam Vital Signs BP (!) 139/96 (BP Location: Right Arm)   Pulse 96   Temp 99 F (37.2 C) (Oral)   Resp 16   LMP  (  LMP Unknown)   SpO2 100%   No data found.  Physical Exam Vitals and nursing note reviewed.  Constitutional:      General: She is awake. She is not in acute distress.    Appearance: Normal appearance. She is well-developed and well-groomed. She is not ill-appearing.  HENT:     Head: Normocephalic and atraumatic.     Salivary Glands: Right salivary gland is not diffusely enlarged or tender. Left salivary gland is not diffusely enlarged or tender.     Right Ear: Hearing, tympanic membrane, ear canal and external ear normal.     Left Ear: Hearing, tympanic membrane, ear canal and external ear normal.     Nose: Mucosal edema, congestion and rhinorrhea present. Rhinorrhea is clear.     Right Turbinates: Not enlarged, swollen or pale.     Left Turbinates: Not enlarged, swollen or pale.     Right Sinus: No maxillary sinus tenderness or frontal sinus tenderness.     Left Sinus: No maxillary sinus tenderness or frontal sinus tenderness.     Mouth/Throat:     Lips: Pink. No lesions.     Mouth: Mucous membranes are moist. No oral lesions.     Tongue: No lesions. Tongue does not deviate from midline.     Palate: No mass and lesions.     Pharynx: Oropharynx is clear. Uvula midline. No pharyngeal swelling, oropharyngeal exudate, posterior oropharyngeal erythema, uvula swelling or postnasal drip.     Tonsils: No tonsillar exudate. 0 on the right. 0 on the left.  Eyes:     General: Lids are normal.        Right eye: No discharge.        Left eye: No discharge.     Conjunctiva/sclera: Conjunctivae normal.     Right eye: Right conjunctiva is not injected.     Left eye: Left conjunctiva is not injected.  Neck:     Trachea: Trachea and phonation normal.  Cardiovascular:     Rate and Rhythm: Normal rate and regular rhythm.  Pulmonary:      Effort: Pulmonary effort is normal.     Breath sounds: No stridor or decreased air movement. Examination of the right-upper field reveals decreased breath sounds. Examination of the right-middle field reveals decreased breath sounds and wheezing. Examination of the right-lower field reveals decreased breath sounds, wheezing and rales. Examination of the left-lower field reveals decreased breath sounds and wheezing. Decreased breath sounds, wheezing and rales present. No rhonchi.     Comments: Repeat auscultation post DuoNeb treatment revealed significant improvement of breath sounds and resolution of wheezing, persistent rales in right lung base. Chest:     Chest wall: No tenderness.  Musculoskeletal:        General: Normal range of motion.     Cervical back: Full passive range of motion without pain, normal range of motion and neck supple. Normal range of motion.  Lymphadenopathy:     Cervical: No cervical adenopathy.  Skin:    General: Skin is warm and dry.     Findings: No erythema or rash.  Neurological:     General: No focal deficit present.     Mental Status: She is alert and oriented to person, place, and time. Mental status is at baseline.  Psychiatric:        Attention and Perception: Attention and perception normal.        Mood and Affect: Mood and affect normal.        Speech:  Speech normal.        Behavior: Behavior normal. Behavior is cooperative.        Thought Content: Thought content normal.     Visual Acuity Right Eye Distance:   Left Eye Distance:   Bilateral Distance:    Right Eye Near:   Left Eye Near:    Bilateral Near:     [facility] Couse / Diagnostics / Procedures:     Radiology DG Chest 2 View Result Date: 03/02/2024 CLINICAL DATA:  Abnormal breath sounds.  Cough and nasal congestion. EXAM: CHEST - 2 VIEW COMPARISON:  04/25/2023. FINDINGS: Trachea is midline. Heart size normal. Lungs are clear. No pleural fluid. Degenerative changes in the spine. IMPRESSION: No  acute findings. Electronically Signed   By: Newell Eke M.D.   On: 03/02/2024 15:11    Procedures Procedures (including critical care time) EKG  Pending results:  Labs Reviewed  POC COVID19/FLU A&B COMBO    Medications Ordered in UC: Medications  ipratropium-albuterol  (DUONEB) 0.5-2.5 (3) MG/3ML nebulizer solution 3 mL (3 mLs Nebulization Given 03/02/24 1414)  dexamethasone  (DECADRON ) injection 10 mg (10 mg Intramuscular Given 03/02/24 1415)    UC Diagnoses / Final Clinical Impressions(s)   I have reviewed the triage vital signs and the nursing notes.  Pertinent labs & imaging results that were available during my care of the patient were reviewed by me and considered in my medical decision making (see chart for details).    Final diagnoses:  Feeling unwell  Rales 2/3 way up posterior chest wall on right side  Lower respiratory tract infection   Patient advised that final x-ray report was not concerning for pneumonia.  Patient also advised that based on my physical exam findings and my personal read of her x-ray I am concerned that she may be developing pneumonia in her right lower lobe.  Patient provided with 5-day course of Augmentin  and azithromycin .  Patient provided with Robitussin to help promote expectoration, albuterol  to open up airways and improve work of cough and breathing.  Patient provided with promethazine  DM to relieve nighttime cough so she can get some sleep with strict instructions not to take potassium chloride  while taking Promethazine  DM.  Patient verbalized understanding.  Conservative care recommended.  Return precautions advised.  Please see discharge instructions below for details of plan of care as provided to patient. ED Prescriptions     Medication Sig Dispense Auth. Provider   albuterol  (VENTOLIN  HFA) 108 (90 Base) MCG/ACT inhaler Inhale 2 puffs into the lungs every 6 (six) hours as needed for wheezing or shortness of breath (Cough). 18 g Joesph Shaver Scales, PA-C   amoxicillin -clavulanate (AUGMENTIN ) 875-125 MG tablet Take 1 tablet by mouth 2 (two) times daily for 5 days. 10 tablet Joesph Shaver Scales, PA-C   azithromycin  (ZITHROMAX ) 250 MG tablet Take 2 tablets (500 mg total) by mouth daily for 1 day, THEN 1 tablet (250 mg total) daily for 4 days. 6 tablet Joesph Shaver Scales, PA-C   guaifenesin  (HUMIBID E) 400 MG TABS tablet Take 1 tablet 3 times daily as needed for chest congestion and cough 30 tablet Joesph Shaver Scales, PA-C   promethazine -dextromethorphan (PROMETHAZINE -DM) 6.25-15 MG/5ML syrup Take 5 mLs by mouth at bedtime as needed for cough. 60 mL Joesph Shaver Scales, PA-C      PDMP not reviewed this encounter.  Pending results:  Labs Reviewed  POC COVID19/FLU A&B COMBO      Discharge Instructions      Your chest x-ray is  not overly concerning for pneumonia, based on physical exam findings I do believe that you should be treated for pneumonia with antibiotics.  I believe that you will have some significant improvement with this.  Please read below to learn more about the medications, dosages and frequencies that I recommend to help alleviate your symptoms and to get you feeling better soon:   Augmentin  (amoxicillin  - clavulanic acid):  Please take one (1) dose twice daily for 5 days.  This antibiotic can cause upset stomach, this will resolve once antibiotics are complete.  You are welcome to take a probiotic, eat yogurt, take Imodium  while taking this medication.  Please avoid other systemic medications such as Maalox, Pepto-Bismol or milk of magnesia as they can interfere with the body's ability to absorb the antibiotics.    Z-Pak (azithromycin ):  Please take two (2) tablets on day one and one tablet daily thereafter until the prescription is complete.This antibiotic can cause upset stomach, this will resolve once antibiotics are complete.  You are welcome to take a probiotic, eat yogurt, take Imodium   while taking this medication.  Please avoid other systemic medications such as Maalox, Pepto-Bismol or milk of magnesia as they can interfere with the body's ability to absorb the antibiotics.   ProAir , Ventolin , Proventil  (albuterol ): This inhaled medication contains a short acting beta agonist bronchodilator.  This medication relaxes the smooth muscle of the airway in the lungs.  When these muscles are tight, breathing becomes more constricted.  The result of relaxation of the smooth muscle is increased air movement and improved work of breathing.  This is a short acting medication that can be used every 4-6 hours as needed for increased work of breathing, shortness of breath, wheezing and excessive coughing.  It comes in the form of a handheld inhaler or nebulizer solution.  I recommended that for the next 3 to 4 days, this medication is used 4 times daily on a scheduled basis then decrease to twice daily and as needed until symptoms have completely resolved which I anticipate will be several weeks.   Robitussin, Mucinex  (guaifenesin ): This is a daytime expectorant.  This single symptom reliever helps break up chest congestion and loosen up thick nasal drainage making phlegm and drainage easier to cough up and to blow out from your nose.  I recommend taking 400 mg in either liquid or tablet form three times daily as needed.  I do not recommend the 12-hour extended relief version or doses higher than 400 mg per each dose as these often make some patients feel jittery or jumpy and can interfere with sleep.  I also do not recommend that you purchase guaifenesin  with the ingredient  DM which is dextromethorphan, a cough suppressant which I only recommend taking at bedtime.  Guaifenesin  400 mg is a safe dose for people who are being treated for high blood pressure.     If symptoms have not meaningfully improved in the next 3 to 5 days, please return for repeat evaluation or follow-up with your regular  provider.  If symptoms have worsened in the next 3 to 5 days, please go to the emergency room for further evaluation.    Thank you for visiting urgent care today.  We appreciate the opportunity to participate in your care.         Disposition Upon Discharge:  Condition: stable for discharge home  Patient presented with an acute illness with associated systemic symptoms and significant discomfort requiring urgent management. In my opinion,  this is a condition that a prudent lay person (someone who possesses an average knowledge of health and medicine) may potentially expect to result in complications if not addressed urgently such as respiratory distress, impairment of bodily function or dysfunction of bodily organs.   Routine symptom specific, illness specific and/or disease specific instructions were discussed with the patient and/or caregiver at length.   As such, the patient has been evaluated and assessed, work-up was performed and treatment was provided in alignment with urgent care protocols and evidence based medicine.  Patient/parent/caregiver has been advised that the patient may require follow up for further testing and treatment if the symptoms continue in spite of treatment, as clinically indicated and appropriate.  Patient/parent/caregiver has been advised to return to the [facility] or PCP if no better; to PCP or the Emergency Department if new signs and symptoms develop, or if the current signs or symptoms continue to change or worsen for further workup, evaluation and treatment as clinically indicated and appropriate  The patient will follow up with their current PCP if and as advised. If the patient does not currently have a PCP we will assist them in obtaining one.   The patient may need specialty follow up if the symptoms continue, in spite of conservative treatment and management, for further workup, evaluation, consultation and treatment as clinically indicated and  appropriate.  Patient/parent/caregiver verbalized understanding and agreement of plan as discussed.  All questions were addressed during visit.  Please see discharge instructions below for further details of plan.  This office note has been dictated using Teaching laboratory technician.  Unfortunately, this method of dictation can sometimes lead to typographical or grammatical errors.  I apologize for your inconvenience in advance if this occurs.  Please do not hesitate to reach out to me if clarification is needed.      Joesph Shaver Scales, PA-C 03/02/24 1524

## 2024-03-02 NOTE — ED Triage Notes (Signed)
 Patient c/o nasal congestion and a non productive cough x 2 days. Patient states she had a negative Covid/flu test at her PCP 2 days as well. Patient states she did receive a Flu shot 2 days as well.  Patient denies taking any medication for her symptoms.

## 2024-03-02 NOTE — ED Notes (Addendum)
 Pt becoming verbally abusive to this RN when attempting to complete the Covid/flu test, pt refuses to have the test done in the proper manner. Pt then advise this RN she will not want to have this RN back on the room and she will give a bad review. Husband then left the room due to pt getting ugly on the room.

## 2024-03-03 ENCOUNTER — Ambulatory Visit

## 2024-03-03 ENCOUNTER — Ambulatory Visit: Payer: Self-pay

## 2024-03-03 ENCOUNTER — Ambulatory Visit (HOSPITAL_COMMUNITY): Payer: Self-pay

## 2024-03-03 NOTE — Telephone Encounter (Addendum)
 Patient states she talked to another nurse already and is being set up for an appointment.  No triage.  First attempt; no answer     Copied from CRM [id number]. Topic: Clinical - Medical Advice >> Mar 03, 2024  8:14 AM [name] wrote: Reason for CRM: patient was rushed to Urgent care 03/02/24 on [address], diagnosed with bronchitis and pneumonia, had Xray done and breathing treatment. Patient is wanting to know if she needs a follow up appt with Boby Mackintosh NP.    Patient would like a nurse to call her back [phone]  Patient was informed their call will be returned before end of business day. This encounter was created in error - please disregard.

## 2024-03-03 NOTE — Telephone Encounter (Signed)
 Pt states that she was seen in clinic last week, was then seen in UC yesterday. Pt was given abx yesterday. Pt has been taking them as rx'd. Pt states she would like an appt today with her PCP. Pt unwilling to see anyone else. Pt confirms that she is not having worsening s/s.   FYI Only or Action Required?: Action required by provider: request for appointment.  Patient was last seen in primary care on 02/28/2024 by Purcell Emil Schanz, MD.  Called Nurse Triage reporting Cough.  Symptoms began several days ago.  Interventions attempted: Prescription medications: abx.  Symptoms are: unchanged.  Triage Disposition: Information or Advice Only Call  Patient/caregiver understands and will follow disposition?: no, wishes to speak to PCP    Summary: returning triage nurse call regarding have pnuemonia and bronchoits was seen at urgent care   Patient returning triage nurse call she received today Regarding her visit to urgent care got worsen yesterday , this morning have a lot of cough was seen at urgent care for short of breath have pnuemonia and bronchotis

## 2024-03-03 NOTE — Telephone Encounter (Signed)
 Pt called again and I was able to speak with her. She states she was dx w pneumonia and respiratory infection yesterday at [facility]. They prescribed her abx and inhaler which she has been using but states she feels the inhaler isnt really working.. she reports wheezing and not getting a full breath of air at times. She just wants to know if vickie recommends anything else as this is really getting to her.   Advised pt vickie is out of office today so will not be until tomorrow until she hears back. Pt was ok w this and was thankful

## 2024-03-04 NOTE — Telephone Encounter (Signed)
 Booked appt for tomorrow, 9/10 to see vickie

## 2024-03-05 ENCOUNTER — Encounter: Payer: Self-pay | Admitting: Family Medicine

## 2024-03-05 ENCOUNTER — Ambulatory Visit: Admitting: Family Medicine

## 2024-03-05 ENCOUNTER — Ambulatory Visit: Payer: Self-pay | Admitting: Family Medicine

## 2024-03-05 ENCOUNTER — Ambulatory Visit (INDEPENDENT_AMBULATORY_CARE_PROVIDER_SITE_OTHER)

## 2024-03-05 VITALS — BP 132/80 | HR 88 | Temp 98.0°F | Ht 59.0 in

## 2024-03-05 DIAGNOSIS — R432 Parageusia: Secondary | ICD-10-CM

## 2024-03-05 DIAGNOSIS — R051 Acute cough: Secondary | ICD-10-CM

## 2024-03-05 DIAGNOSIS — J22 Unspecified acute lower respiratory infection: Secondary | ICD-10-CM

## 2024-03-05 DIAGNOSIS — G44209 Tension-type headache, unspecified, not intractable: Secondary | ICD-10-CM

## 2024-03-05 DIAGNOSIS — R062 Wheezing: Secondary | ICD-10-CM

## 2024-03-05 LAB — CBC WITH DIFFERENTIAL/PLATELET
Basophils Absolute: 0 K/uL (ref 0.0–0.1)
Basophils Relative: 0.2 % (ref 0.0–3.0)
Eosinophils Absolute: 0.2 K/uL (ref 0.0–0.7)
Eosinophils Relative: 2.3 % (ref 0.0–5.0)
HCT: 34.3 % — ABNORMAL LOW (ref 36.0–46.0)
Hemoglobin: 10.7 g/dL — ABNORMAL LOW (ref 12.0–15.0)
Lymphocytes Relative: 37.8 % (ref 12.0–46.0)
Lymphs Abs: 2.7 K/uL (ref 0.7–4.0)
MCHC: 31.3 g/dL (ref 30.0–36.0)
MCV: 72.1 fl — ABNORMAL LOW (ref 78.0–100.0)
Monocytes Absolute: 0.6 K/uL (ref 0.1–1.0)
Monocytes Relative: 8.9 % (ref 3.0–12.0)
Neutro Abs: 3.7 K/uL (ref 1.4–7.7)
Neutrophils Relative %: 50.8 % (ref 43.0–77.0)
Platelets: 269 K/uL (ref 150.0–400.0)
RBC: 4.75 Mil/uL (ref 3.87–5.11)
RDW: 15.7 % — ABNORMAL HIGH (ref 11.5–15.5)
WBC: 7.3 K/uL (ref 4.0–10.5)

## 2024-03-05 LAB — POC COVID19 BINAXNOW: SARS Coronavirus 2 Ag: NEGATIVE

## 2024-03-05 MED ORDER — PREDNISONE 20 MG PO TABS: 40.0000 mg | ORAL_TABLET | Freq: Every day | ORAL | 0 refills | Status: DC

## 2024-03-05 NOTE — Progress Notes (Unsigned)
 Subjective:    Discussed the use of AI scribe software for clinical note transcription with the patient, who gave verbal consent to proceed.  History of Present Illness [name] is a 63 year old female who presents with a recent diagnosis of possible early pneumonia.  Respiratory symptoms - Persistent cough since last week, prior to urgent care visit - Increased wheezing despite treatment - Fatigue ongoing since symptom onset - Recent loss of taste - COVID-19 and influenza tests negative  Headache - Headache present since onset of respiratory symptoms  Epistaxis - Two episodes of nosebleeds since onset of illness  Recent treatment for pneumonia - Diagnosed with probable right lower lobe pneumonia on March 02, 2024 at urgent care - Prescribed Augmentin , azithromycin , guaifenesin , promethazine  DM, and albuterol  inhaler - Received DuoNeb treatment - Symptoms persist despite current medication regimen     ROS as in subjective.   Objective: Vitals:   03/05/24 1530  BP: 132/80  Pulse: 88  Temp: 98 F (36.7 C)  SpO2: 96%    General appearance: Alert, WD/WN, no distress, mildly ill appearing                             Skin: warm, no rash                           Head: no sinus tenderness                            Eyes: conjunctiva normal, corneas clear, PERRLA                            Ears: pearly TMs, external ear canals normal                          Nose: mask       Neck: supple, no adenopathy, no thyromegaly, nontender                          Heart: RRR                         Lungs: + wheezes bilateral lower lungs, no rales, or rhonchi      Assessment/Plan:  Assessment and Plan Assessment & Plan Suspected COVID-19 infection versus right lower lobe pneumonia Probable right lower lobe pneumonia treated with Augmentin , azithromycin , guaifenesin , promethazine  DM, and albuterol  inhaler. Symptoms include loss of taste, suggesting possible COVID-19  infection. Antibiotics are ineffective for viral infections, indicating a likely viral etiology. - Test for COVID-19. Negative result  - Repeat chest x-ray  Cough and wheezing Persistent cough and increased wheezing despite current treatment. Symptoms suggestive of a viral infection, possibly COVID-19 although she is negative.  - 5 day course of oral prednisone  prescribed.  -Continue current medications prescribed by urgent care and use albuterol  prn  Fatigue and headache Ongoing fatigue and headache, possibly associated with a viral infection such as COVID-19.  Epistaxis (nosebleeds) Two episodes of epistaxis reported. No specific cause identified. -Recommend saline nasal spray   Loss of taste Loss of taste noted a couple of days ago, common in COVID-19 infection. - negative Covid test today   Work note provided

## 2024-03-10 ENCOUNTER — Telehealth: Payer: Self-pay | Admitting: Radiology

## 2024-03-10 NOTE — Telephone Encounter (Signed)
 Copied from CRM [id number]. Topic: General - Other >> Mar 10, 2024  8:05 AM [name] wrote: Reason for CRM: patient called stating the doctor wanted her to give an update on how she was feeling and she stated everything is about the same  CB [phone]

## 2024-03-20 ENCOUNTER — Ambulatory Visit
Admission: RE | Admit: 2024-03-20 | Discharge: 2024-03-20 | Disposition: A | Discharge status: Home / Self Care | Source: Ambulatory Visit | Attending: Emergency Medicine | Admitting: Emergency Medicine

## 2024-03-20 DIAGNOSIS — Z1231 Encounter for screening mammogram for malignant neoplasm of breast: Secondary | ICD-10-CM

## 2024-03-21 ENCOUNTER — Telehealth: Payer: Self-pay

## 2024-03-21 NOTE — Telephone Encounter (Signed)
 Patient has appt for injection on 10/6 but was wanting a muscle relaxer called in. Has tried Robaxin  in the past.

## 2024-03-24 ENCOUNTER — Encounter: Admitting: Physical Medicine and Rehabilitation

## 2024-03-24 ENCOUNTER — Other Ambulatory Visit: Payer: Self-pay | Admitting: Nurse Practitioner

## 2024-03-24 ENCOUNTER — Other Ambulatory Visit: Payer: Self-pay | Admitting: Family Medicine

## 2024-03-24 ENCOUNTER — Other Ambulatory Visit: Payer: Self-pay | Admitting: Physical Medicine and Rehabilitation

## 2024-03-24 ENCOUNTER — Telehealth: Payer: Self-pay | Admitting: Physical Medicine and Rehabilitation

## 2024-03-24 ENCOUNTER — Other Ambulatory Visit (HOSPITAL_COMMUNITY): Payer: Self-pay

## 2024-03-24 ENCOUNTER — Encounter (HOSPITAL_COMMUNITY): Payer: Self-pay

## 2024-03-24 DIAGNOSIS — E538 Deficiency of other specified B group vitamins: Secondary | ICD-10-CM

## 2024-03-24 MED ORDER — METHOCARBAMOL 500 MG PO TABS: 500.0000 mg | ORAL_TABLET | Freq: Three times a day (TID) | ORAL | 0 refills | Status: AC

## 2024-03-24 MED ORDER — PANTOPRAZOLE SODIUM 40 MG PO TBEC
40.0000 mg | DELAYED_RELEASE_TABLET | Freq: Two times a day (BID) | ORAL | 0 refills | Status: DC
  Filled 2024-03-24: qty 60, 30d supply, fill #0

## 2024-03-24 NOTE — Telephone Encounter (Signed)
 Pt called requesting an immediate call back. Pt states pain shooting down into her back and down in her foot. Please call pt at [phone].

## 2024-03-25 ENCOUNTER — Ambulatory Visit: Payer: Self-pay | Admitting: Emergency Medicine

## 2024-03-25 ENCOUNTER — Telehealth: Payer: Self-pay | Admitting: Radiology

## 2024-03-25 ENCOUNTER — Telehealth: Payer: Self-pay | Admitting: Physical Medicine and Rehabilitation

## 2024-03-25 ENCOUNTER — Other Ambulatory Visit (HOSPITAL_COMMUNITY): Payer: Self-pay

## 2024-03-25 ENCOUNTER — Other Ambulatory Visit: Payer: Self-pay | Admitting: Physical Medicine and Rehabilitation

## 2024-03-25 ENCOUNTER — Telehealth: Payer: Self-pay

## 2024-03-25 DIAGNOSIS — E538 Deficiency of other specified B group vitamins: Secondary | ICD-10-CM

## 2024-03-25 NOTE — Telephone Encounter (Signed)
 Per chart, she is to take 1 daily. She is taking 2, do I need to update chart and rx or have her go back to 1?

## 2024-03-25 NOTE — Telephone Encounter (Signed)
 Patient called and said her mychart still isn't working and if you could get Duwaine to write the work note and she will pick it up. CB#[phone]

## 2024-03-25 NOTE — Telephone Encounter (Signed)
 Copied from CRM [id number]. Topic: Clinical - Medication Question >> Mar 25, 2024  9:48 AM Charolett L wrote: Reason for CRM: patient is calling In because she sent in a refill for the folic acid  (FOLVITE ) 1 MG tablet and it was denied and she wanted to know why. She stated that she takes 2 pills a day and wanted a call back

## 2024-03-25 NOTE — Telephone Encounter (Signed)
 Patient requesting a not to be out of work tomorrow

## 2024-03-25 NOTE — Telephone Encounter (Signed)
 Patient called and said her back is hurting so bad she had to leave work. CB#[phone]

## 2024-03-26 ENCOUNTER — Other Ambulatory Visit (HOSPITAL_COMMUNITY): Payer: Self-pay

## 2024-03-26 MED ORDER — FOLIC ACID 1 MG PO TABS
1.0000 mg | ORAL_TABLET | Freq: Every day | ORAL | 1 refills | Status: AC
  Filled 2024-03-26 – 2024-05-26 (×2): qty 90, 90d supply, fill #0

## 2024-03-26 NOTE — Addendum Note (Signed)
 Addended by: Samanthajo Payano E on: 03/26/2024 09:24 AM   Modules accepted: Orders

## 2024-03-26 NOTE — Telephone Encounter (Signed)
 Called pt, she reports the agent wrote that wrong and she is taking it once daily. It is her stomach pill she is taking BID (which is reflected in the notes). I refilled the folic acid  for her

## 2024-03-31 ENCOUNTER — Ambulatory Visit: Admitting: Physical Medicine and Rehabilitation

## 2024-03-31 ENCOUNTER — Other Ambulatory Visit: Payer: Self-pay | Admitting: Nurse Practitioner

## 2024-03-31 ENCOUNTER — Other Ambulatory Visit: Payer: Self-pay

## 2024-03-31 VITALS — BP 124/84 | HR 88

## 2024-03-31 DIAGNOSIS — M5416 Radiculopathy, lumbar region: Secondary | ICD-10-CM

## 2024-03-31 MED ORDER — METHYLPREDNISOLONE ACETATE 80 MG/ML IJ SUSP
40.0000 mg | Freq: Once | INTRAMUSCULAR | Status: AC
  Administered 2024-03-31: 40 mg

## 2024-03-31 NOTE — Progress Notes (Signed)
 [name] - 63 y.o. female MRN [id number]  Date of birth: [birth year]  Office Visit Note: Visit Date: 03/31/2024 PCP: Lendia Boby CROME, NP-C Referred by: Lendia Boby CROME, NP-C  Subjective: Chief Complaint  Patient presents with   Lower Back - Pain   HPI:  [name] is a 63 y.o. female who comes in today at the request of Duwaine Pouch, FNP for planned Left S1-2 Lumbar Transforaminal epidural steroid injection with fluoroscopic guidance.  The patient has failed conservative care including home exercise, medications, time and activity modification.  This injection will be diagnostic and hopefully therapeutic.  Please see requesting physician notes for further details and justification.   ROS Otherwise per HPI.  Assessment & Plan: Visit Diagnoses:    ICD-10-CM   1. Lumbar radiculopathy  M54.16 XR C-ARM NO REPORT    Epidural Steroid injection    methylPREDNISolone  acetate (DEPO-MEDROL ) injection 40 mg      Plan: No additional findings.   Meds & Orders:  Meds ordered this encounter  Medications   methylPREDNISolone  acetate (DEPO-MEDROL ) injection 40 mg    Orders Placed This Encounter  Procedures   XR C-ARM NO REPORT   Epidural Steroid injection    Follow-up: Return for visit to requesting provider as needed.   Procedures: No procedures performed  Lumbosacral Transforaminal Epidural Steroid Injection - Sub-Pedicular Approach with Fluoroscopic Guidance  Patient: [name]      Date of Birth: 1960/12/16 MRN: [id number] PCP: Lendia Boby CROME, NP-C      Visit Date: 03/31/2024   Universal Protocol:    Date/Time: 03/31/2024  Consent Given By: the patient  Position: PRONE  Additional Comments: Vital signs were monitored before and after the procedure. Patient was prepped and draped in the usual sterile fashion. The correct patient, procedure, and site was verified.   Injection Procedure Details:   Procedure diagnoses: Lumbar radiculopathy  [M54.16]    Meds Administered:  Meds ordered this encounter  Medications   methylPREDNISolone  acetate (DEPO-MEDROL ) injection 40 mg    Laterality: Left  Location/Site: S1  Needle:5.0 in., 22 ga.  Short bevel or Quincke spinal needle  Needle Placement: Transforaminal  Findings:    -Comments: Excellent flow of contrast along the nerve, nerve root and into the epidural space.  Procedure Details: After squaring off the end-plates to get a true AP view, the C-arm was positioned so that an oblique view of the foramen as noted above was visualized. The target area is just inferior to the nose of the scotty dog or sub pedicular. The soft tissues overlying this structure were infiltrated with 2-3 ml. of 1% Lidocaine  without Epinephrine .  The spinal needle was inserted toward the target using a trajectory view along the fluoroscope beam.  Under AP and lateral visualization, the needle was advanced so it did not puncture dura and was located close the 6 O'Clock position of the pedical in AP tracterory. Biplanar projections were used to confirm position. Aspiration was confirmed to be negative for CSF and/or blood. A 1-2 ml. volume of Isovue-250 was injected and flow of contrast was noted at each level. Radiographs were obtained for documentation purposes.   After attaining the desired flow of contrast documented above, a 0.5 to 1.0 ml test dose of 0.25% Marcaine  was injected into each respective transforaminal space.  The patient was observed for 90 seconds post injection.  After no sensory deficits were reported, and normal lower extremity motor function was noted,   the above injectate  was administered so that equal amounts of the injectate were placed at each foramen (level) into the transforaminal epidural space.   Additional Comments:  The patient tolerated the procedure well Dressing: 2 x 2 sterile gauze and Band-Aid    Post-procedure details: Patient was observed during the  procedure. Post-procedure instructions were reviewed.  Patient left the clinic in stable condition.    Clinical History: MR LUMBAR SPINE WITHOUT IV CONTRAST   COMPARISON: 03/10/2021   CLINICAL HISTORY: Low back pain, symptoms persist with conservative treatment.   TECHNIQUE: SAG T2, SAG T1, SAG STIR, AX T2, AX T1 without IV contrast.   FINDINGS: There is normal alignment of the lumbar spine. Mild multilevel disc desiccation is present with moderate facet arthrosis in the lower lumbar spine. Postlaminectomy changes are seen at L4-5 and L5-S1. There is no vertebral body height loss, subluxation or marrow replacing process. The sacrum and SI joints are unremarkable so far as visualized. Conus and cauda equina are unremarkable.   T12-L1: Mild facet arthrosis. No significant foraminal or spinal stenosis.   L1-2: Mild facet arthrosis. No significant foraminal or spinal stenosis.   L2-3: Mild broad-based bulge without significant foraminal or spinal stenosis. Mild facet arthrosis is present.   L3-4: Mild broad-based disc osteophyte without significant foraminal or spinal stenosis. Mild-to-moderate facet arthrosis is present.   L4-5: There is a broad-based bulge and moderate facet arthrosis slightly crowding the descending nerve roots in the lateral recess. There is moderate bilateral foraminal narrowing. Correlation for L4 radiculopathy. Laminectomy changes are present.   L5-S1: Broad-based disc osteophyte with moderate facet arthrosis. There is caudal foraminal narrowing bilaterally. Correlation for L5 radicular symptoms. No significant spinal stenosis. Prior laminectomy.   The retroperitoneal structures demonstrate no significant abnormality.   IMPRESSION: Postlaminectomy changes at L4-5 and L5-S1. There is no significant residual spinal stenosis. There is stable moderate foraminal narrowing at L4-5 and L5-S1 bilaterally. Correlation for mild radicular symptoms.    Electronically signed by: Norleen Satchel MD 11/26/2023 05:34 PM EDT RP Workstation: [id number]     Objective:  VS:  HT:    WT:   BMI:     BP:124/84  HR:88bpm  TEMP: ( )  RESP:  Physical Exam Vitals and nursing note reviewed.  Constitutional:      General: She is not in acute distress.    Appearance: Normal appearance. She is not ill-appearing.  HENT:     Head: Normocephalic and atraumatic.     Right Ear: External ear normal.     Left Ear: External ear normal.  Eyes:     Extraocular Movements: Extraocular movements intact.  Cardiovascular:     Rate and Rhythm: Normal rate.     Pulses: Normal pulses.  Pulmonary:     Effort: Pulmonary effort is normal. No respiratory distress.  Abdominal:     General: There is no distension.     Palpations: Abdomen is soft.  Musculoskeletal:        General: Tenderness present.     Cervical back: Neck supple.     Right lower leg: No edema.     Left lower leg: No edema.     Comments: Patient has good distal strength with no pain over the greater trochanters.  No clonus or focal weakness.  Skin:    Findings: No erythema, lesion or rash.  Neurological:     General: No focal deficit present.     Mental Status: She is alert and oriented to person, place, and time.  Sensory: No sensory deficit.     Motor: No weakness or abnormal muscle tone.     Coordination: Coordination normal.  Psychiatric:        Mood and Affect: Mood normal.        Behavior: Behavior normal.      Imaging: No results found.

## 2024-03-31 NOTE — Progress Notes (Signed)
 Pain Scale   Average Pain 10 Patient advising she has chronic lower back pain radiating bilaterally to legs.         +Driver, -BT, -Dye Allergies.

## 2024-03-31 NOTE — Procedures (Signed)
 Lumbosacral Transforaminal Epidural Steroid Injection - Sub-Pedicular Approach with Fluoroscopic Guidance  Patient: [name]      Date of Birth: 1960-10-08 MRN: [id number] PCP: Lendia Boby CROME, NP-C      Visit Date: 03/31/2024   Universal Protocol:    Date/Time: 03/31/2024  Consent Given By: the patient  Position: PRONE  Additional Comments: Vital signs were monitored before and after the procedure. Patient was prepped and draped in the usual sterile fashion. The correct patient, procedure, and site was verified.   Injection Procedure Details:   Procedure diagnoses: Lumbar radiculopathy [M54.16]    Meds Administered:  Meds ordered this encounter  Medications   methylPREDNISolone  acetate (DEPO-MEDROL ) injection 40 mg    Laterality: Left  Location/Site: S1  Needle:5.0 in., 22 ga.  Short bevel or Quincke spinal needle  Needle Placement: Transforaminal  Findings:    -Comments: Excellent flow of contrast along the nerve, nerve root and into the epidural space.  Procedure Details: After squaring off the end-plates to get a true AP view, the C-arm was positioned so that an oblique view of the foramen as noted above was visualized. The target area is just inferior to the nose of the scotty dog or sub pedicular. The soft tissues overlying this structure were infiltrated with 2-3 ml. of 1% Lidocaine  without Epinephrine .  The spinal needle was inserted toward the target using a trajectory view along the fluoroscope beam.  Under AP and lateral visualization, the needle was advanced so it did not puncture dura and was located close the 6 O'Clock position of the pedical in AP tracterory. Biplanar projections were used to confirm position. Aspiration was confirmed to be negative for CSF and/or blood. A 1-2 ml. volume of Isovue-250 was injected and flow of contrast was noted at each level. Radiographs were obtained for documentation purposes.   After attaining the desired  flow of contrast documented above, a 0.5 to 1.0 ml test dose of 0.25% Marcaine  was injected into each respective transforaminal space.  The patient was observed for 90 seconds post injection.  After no sensory deficits were reported, and normal lower extremity motor function was noted,   the above injectate was administered so that equal amounts of the injectate were placed at each foramen (level) into the transforaminal epidural space.   Additional Comments:  The patient tolerated the procedure well Dressing: 2 x 2 sterile gauze and Band-Aid    Post-procedure details: Patient was observed during the procedure. Post-procedure instructions were reviewed.  Patient left the clinic in stable condition.

## 2024-04-01 ENCOUNTER — Telehealth: Payer: Self-pay | Admitting: Physical Medicine and Rehabilitation

## 2024-04-01 NOTE — Telephone Encounter (Signed)
 Patient called. She would like to speak with someone regarding her injection.

## 2024-04-08 ENCOUNTER — Telehealth: Payer: Self-pay | Admitting: Physical Medicine and Rehabilitation

## 2024-04-08 NOTE — Telephone Encounter (Signed)
 Patient called and said that the injection didn't help at all. CB#[phone]

## 2024-04-11 ENCOUNTER — Telehealth: Payer: Self-pay

## 2024-04-11 NOTE — Telephone Encounter (Signed)
 Patient called to say that the recent injection did not help. She is still having pain in back and down her legs.

## 2024-04-13 ENCOUNTER — Other Ambulatory Visit: Payer: Self-pay | Admitting: Nurse Practitioner

## 2024-04-14 ENCOUNTER — Ambulatory Visit (INDEPENDENT_AMBULATORY_CARE_PROVIDER_SITE_OTHER): Admitting: Physical Medicine and Rehabilitation

## 2024-04-14 ENCOUNTER — Other Ambulatory Visit (HOSPITAL_COMMUNITY): Payer: Self-pay

## 2024-04-14 ENCOUNTER — Encounter: Payer: Self-pay | Admitting: Physical Medicine and Rehabilitation

## 2024-04-14 DIAGNOSIS — M7918 Myalgia, other site: Secondary | ICD-10-CM | POA: Diagnosis not present

## 2024-04-14 DIAGNOSIS — M5416 Radiculopathy, lumbar region: Secondary | ICD-10-CM

## 2024-04-14 DIAGNOSIS — M5442 Lumbago with sciatica, left side: Secondary | ICD-10-CM

## 2024-04-14 DIAGNOSIS — G8929 Other chronic pain: Secondary | ICD-10-CM | POA: Diagnosis not present

## 2024-04-14 MED ORDER — PANTOPRAZOLE SODIUM 40 MG PO TBEC
40.0000 mg | DELAYED_RELEASE_TABLET | Freq: Two times a day (BID) | ORAL | 0 refills | Status: DC
  Filled 2024-04-14: qty 60, 30d supply, fill #0
  Filled ????-??-??: fill #0

## 2024-04-14 NOTE — Progress Notes (Unsigned)
 Patient says that she got about 50% relief for 2 weeks from her most recent injection. She says that the muscle relaxer alleviate the worst of her pain, but does not alleviate her pain completely. She does still have pain down the left leg.

## 2024-04-14 NOTE — Progress Notes (Unsigned)
 [name] - 63 y.o. female MRN [id number]  Date of birth: 04-Aug-1960  Office Visit Note: Visit Date: 04/14/2024 PCP: Lendia Boby CROME, NP-C Referred by: Lendia Boby CROME, NP-C  Subjective: Chief Complaint  Patient presents with   Lower Back - Pain   HPI: [name] is a 64 y.o. female who comes in today for evaluation of chronic, worsening and severe bilateral lower back pain radiating to left buttock and down posterior leg to foot. Pain ongoing for several years. She is previous patient of Dr. Oneil Herald. Her pain becomes severe with standing and walking. She describes pain as sore and aching sensation, currently rates as 8 out of 10. Some relief of pain with home exercise regimen, rest and use of medications. No history of formal physical therapy. She does take Robaxin  as needed. Recent lumbar MRI imaging shows postlaminectomy changes at L4-5 and L5-S1, no significant residual stenosis. There is stable moderate foraminal stenosis and moderate facet arthropathy at these levels. Lateral recess narrowing at L4-L5. She underwent bilateral L4-L5 and L5-S1 facet joint injections on 02/13/2024, she reports greater than 50% for 1 month. She also underwent left greater trochanter injection on 02/19/2024 and left S1 transforaminal epidural steroid injection on 03/31/2024, minimal relief of pain with these injections. Patient denies focal weakness, numbness and tingling. No recent trauma or falls. She is currently working as Arboriculturist at [employer].      Review of Systems  Musculoskeletal:  Positive for back pain and myalgias.  Neurological:  Negative for tingling, sensory change, focal weakness and weakness.  All other systems reviewed and are negative.  Otherwise per HPI.  Assessment & Plan: Visit Diagnoses:    ICD-10-CM   1. Chronic left-sided low back pain with left-sided sciatica  M54.42    G89.29     2. Lumbar radiculopathy  M54.16     3. Myofascial pain  syndrome  M79.18        Plan: Findings:  Chronic, worsening and severe bilateral lower back pain radiating to left buttock and down posterior leg to foot. Patient continues to have severe pain despite good conservative therapies such as home exercise regimen, rest and use of medications. Patients clinical presentation and exam are complex. There is foraminal narrowing at L4-L5 and L5-S1. Lateral recess narrowing at L4-L5. She does have myofascial tenderness noted to left lower back/buttock region today. We discussed treatment plan in detail today. Minimal relief of pain with previous lumbar epidural steroid injection. I would like for her to attend short course of formal physical therapy with a focus on strengthening and possible dry needling. I am happy to refill Robaxin  as needed, can also take Tylenol  500 mg 3 times a day. Would consider referral to our spine surgeon Dr. Ozell Ada, she would like to hold at this time. If she would like to try lumbar injection again, would recommend L5 transforaminal approach. She has no questions at this time. No red flag symptoms noted upon exam today.     Meds & Orders: No orders of the defined types were placed in this encounter.  No orders of the defined types were placed in this encounter.   Follow-up: Return if symptoms worsen or fail to improve.   Procedures: No procedures performed      Clinical History: MR LUMBAR SPINE WITHOUT IV CONTRAST   COMPARISON: 03/10/2021   CLINICAL HISTORY: Low back pain, symptoms persist with conservative treatment.   TECHNIQUE: SAG T2, SAG T1, SAG  STIR, AX T2, AX T1 without IV contrast.   FINDINGS: There is normal alignment of the lumbar spine. Mild multilevel disc desiccation is present with moderate facet arthrosis in the lower lumbar spine. Postlaminectomy changes are seen at L4-5 and L5-S1. There is no vertebral body height loss, subluxation or marrow replacing process. The sacrum and SI joints are  unremarkable so far as visualized. Conus and cauda equina are unremarkable.   T12-L1: Mild facet arthrosis. No significant foraminal or spinal stenosis.   L1-2: Mild facet arthrosis. No significant foraminal or spinal stenosis.   L2-3: Mild broad-based bulge without significant foraminal or spinal stenosis. Mild facet arthrosis is present.   L3-4: Mild broad-based disc osteophyte without significant foraminal or spinal stenosis. Mild-to-moderate facet arthrosis is present.   L4-5: There is a broad-based bulge and moderate facet arthrosis slightly crowding the descending nerve roots in the lateral recess. There is moderate bilateral foraminal narrowing. Correlation for L4 radiculopathy. Laminectomy changes are present.   L5-S1: Broad-based disc osteophyte with moderate facet arthrosis. There is caudal foraminal narrowing bilaterally. Correlation for L5 radicular symptoms. No significant spinal stenosis. Prior laminectomy.   The retroperitoneal structures demonstrate no significant abnormality.   IMPRESSION: Postlaminectomy changes at L4-5 and L5-S1. There is no significant residual spinal stenosis. There is stable moderate foraminal narrowing at L4-5 and L5-S1 bilaterally. Correlation for mild radicular symptoms.   Electronically signed by: Norleen Satchel MD 11/26/2023 05:34 PM EDT RP Workstation: [id number]   She reports that she has been smoking cigarettes. She has a 9.5 pack-year smoking history. She has never used smokeless tobacco. No results for input(s): HGBA1C, LABURIC in the last 8760 hours.  Objective:  VS:  HT:    WT:   BMI:     BP:   HR: bpm  TEMP: ( )  RESP:  Physical Exam Vitals and nursing note reviewed.  HENT:     Head: Normocephalic and atraumatic.     Right Ear: External ear normal.     Left Ear: External ear normal.     Nose: Nose normal.     Mouth/Throat:     Mouth: Mucous membranes are moist.  Eyes:     Extraocular Movements: Extraocular  movements intact.  Cardiovascular:     Rate and Rhythm: Normal rate.     Pulses: Normal pulses.  Pulmonary:     Effort: Pulmonary effort is normal.  Abdominal:     General: Abdomen is flat. There is no distension.  Musculoskeletal:        General: Tenderness present.     Cervical back: Normal range of motion.     Comments: Patient rises from seated position to standing without difficulty. Good lumbar range of motion. No pain noted with facet loading. 5/5 strength noted with bilateral hip flexion, knee flexion/extension, ankle dorsiflexion/plantarflexion and EHL. No clonus noted bilaterally. No pain upon palpation of greater trochanters. No pain with internal/external rotation of bilateral hips. Sensation intact bilaterally. Myofascial tenderness noted upon palpation of left lumbar paraspinal region/left buttock. Negative slump test bilaterally. Ambulates without aid, gait steady.     Skin:    General: Skin is warm and dry.     Capillary Refill: Capillary refill takes less than 2 seconds.  Neurological:     General: No focal deficit present.     Mental Status: She is alert and oriented to person, place, and time.  Psychiatric:        Mood and Affect: Mood normal.  Behavior: Behavior normal.     Ortho Exam  Imaging: No results found.  Past Medical/Family/Surgical/Social History: Medications & Allergies reviewed per EMR, new medications updated. 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   Cough due to ACE inhibitor 05/16/2022   Myalgia 05/16/2022   Hypomagnesemia 03/19/2022   Vitamin B 12 deficiency  03/19/2022   Low TSH level 03/19/2022   Right elbow pain 03/19/2022   Encounter for screening mammogram for malignant neoplasm of breast 03/19/2022   History of hypertension 12/13/2021   History of smoking 12/13/2021   Elevated blood pressure reading 12/13/2021   Contusion of right knee 12/13/2021   Fall 12/13/2021   Muscle cramps 12/04/2021   Elevated liver enzymes 12/04/2021   Paresthesias 12/04/2021   Elevated LFTs 12/04/2021   Anemia 10/20/2021   Chronic foot pain 10/20/2021   Folic acid  deficiency 09/19/2021   Chronic pain syndrome 09/07/2021   Status post lumbar spine surgery for decompression of spinal cord 05/24/2021   Dyspepsia 05/23/2021   History of gastroesophageal reflux (GERD) 05/23/2021   History of lumbar laminectomy for spinal cord decompression 04/26/2021   Lumbar stenosis 04/13/2021   Tension headache 12/22/2020   Low back pain 10/20/2020   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/28/2020   Hypokalemia 12/17/2019   Chronic diarrhea 09/14/2017   History of stroke 06/12/2014   Essential hypertension, benign 01/24/2010   Microcytic anemia 01/11/2010   Hyperlipemia 05/28/2007   Current smoker 05/28/2007   GERD 05/28/2007   Past Medical History:  Diagnosis Date   Anemia    Arthritis    neck, hips (04/25/2018)   Claustrophobia    Daily headache    no longer a problem   GERD (gastroesophageal reflux disease)    Hiatal hernia    Hyperlipidemia    hx (04/25/2018)   Hypertension    Plantar fasciitis    hx - left foot   Pneumonia    Stroke (HCC) 05/2014   mini stroke (04/25/2018)   Family History  Problem Relation Age of Onset   Pancreatic cancer Mother 74   Heart disease Mother    Esophageal cancer Father 1   Heart disease Sister    Breast cancer Maternal Aunt 60   Colon cancer Maternal Uncle    Pancreatic cancer Maternal Uncle    Colon cancer Paternal Uncle    Rectal cancer Neg Hx    Stomach cancer Neg Hx    Past Surgical History:   Procedure Laterality Date   ABDOMINAL HYSTERECTOMY  12/09   Secondary to fibroids   ARTERY BIOPSY Right 04/10/2018   Procedure: BIOPSY TEMPORAL ARTERY;  Surgeon: Oris Krystal FALCON, MD;  Location: [facility];  Service: Vascular;  Laterality: Right;   CESAREAN SECTION  1986; 1988   COLONOSCOPY     CONDYLOMA EXCISION/FULGURATION N/A 11/30/2023   Procedure: REMOVAL, CONDYLOMA;  Surgeon: Teresa Lonni HERO, MD;  Location: [facility];  Service: General;  Laterality: N/A;  Excision of hypertrophic anal papilla   FOOT SURGERY Left 03/2017   Bone Spurs Removed   INJECTION, CHEMODENERVATION AGENT N/A 11/30/2023   Procedure: INJECTION, CHEMODENERVATION AGENT;  Surgeon: Teresa Lonni HERO,  MD;  Location: [facility];  Service: General;  Laterality: N/A;   LUMBAR LAMINECTOMY/DECOMPRESSION MICRODISCECTOMY N/A 04/13/2021   Procedure: LUMBAR FOUR- LUMBAR FIVE AND LUMBAR FIVE-SACRAL ONE  DECOMPRESSION;  Surgeon: Barbarann Oneil BROCKS, MD;  Location: [facility] OR;  Service: Orthopedics;  Laterality: N/A;   RADIOLOGY WITH ANESTHESIA N/A 04/26/2018   Procedure: MRI WITH ANESTHESIA;  Surgeon: Radiologist, Medication, MD;  Location: [facility] OR;  Service: Radiology;  Laterality: N/A;   RADIOLOGY WITH ANESTHESIA N/A 08/24/2019   Procedure: MRI WITH ANESTHESIA CERVICAL SPINE;  Surgeon: Radiologist, Medication, MD;  Location: [facility] OR;  Service: Radiology;  Laterality: N/A;   RADIOLOGY WITH ANESTHESIA N/A 10/02/2019   Procedure: MRI WITH ANESTHESIA CERVICAL WITHOUT CONTRAST;  Surgeon: Radiologist, Medication, MD;  Location: [facility] OR;  Service: Radiology;  Laterality: N/A;   RECTAL EXAM UNDER ANESTHESIA N/A 11/30/2023   Procedure: EXAM UNDER ANESTHESIA, RECTUM;  Surgeon: Teresa Lonni HERO, MD;  Location: [facility];  Service: General;  Laterality: N/A;   TUBAL LIGATION  1988   Social History   Occupational History   Not on file  Tobacco Use   Smoking status: Some Days    Current packs/day: 0.25    Average packs/day: 0.3 packs/day for 38.0 years (9.5 ttl  pk-yrs)    Types: Cigarettes   Smokeless tobacco: Never   Tobacco comments:    1 cigs/day 08/17/2022 km,cma  Vaping Use   Vaping status: Never Used  Substance and Sexual Activity   Alcohol use: Yes    Alcohol/week: 4.0 standard drinks of alcohol    Types: 4 Cans of beer per week   Drug use: Not Currently   Sexual activity: Not Currently    Birth control/protection: Surgical    Comment: Hysterectomy

## 2024-04-22 ENCOUNTER — Other Ambulatory Visit: Payer: Self-pay

## 2024-04-22 ENCOUNTER — Ambulatory Visit: Payer: Self-pay | Admitting: Family Medicine

## 2024-04-22 ENCOUNTER — Encounter: Payer: Self-pay | Admitting: Family Medicine

## 2024-04-22 ENCOUNTER — Other Ambulatory Visit (HOSPITAL_COMMUNITY): Payer: Self-pay

## 2024-04-22 ENCOUNTER — Ambulatory Visit: Admitting: Family Medicine

## 2024-04-22 VITALS — BP 122/84 | HR 105 | Temp 98.6°F | Ht 59.0 in | Wt 137.0 lb

## 2024-04-22 DIAGNOSIS — R7989 Other specified abnormal findings of blood chemistry: Secondary | ICD-10-CM

## 2024-04-22 DIAGNOSIS — R6883 Chills (without fever): Secondary | ICD-10-CM | POA: Diagnosis not present

## 2024-04-22 DIAGNOSIS — R000 Tachycardia, unspecified: Secondary | ICD-10-CM | POA: Diagnosis not present

## 2024-04-22 DIAGNOSIS — R52 Pain, unspecified: Secondary | ICD-10-CM

## 2024-04-22 DIAGNOSIS — R9431 Abnormal electrocardiogram [ECG] [EKG]: Secondary | ICD-10-CM | POA: Diagnosis not present

## 2024-04-22 DIAGNOSIS — K219 Gastro-esophageal reflux disease without esophagitis: Secondary | ICD-10-CM

## 2024-04-22 DIAGNOSIS — R197 Diarrhea, unspecified: Secondary | ICD-10-CM | POA: Diagnosis not present

## 2024-04-22 DIAGNOSIS — R112 Nausea with vomiting, unspecified: Secondary | ICD-10-CM

## 2024-04-22 LAB — COMPREHENSIVE METABOLIC PANEL WITH GFR
ALT: 100 U/L — ABNORMAL HIGH (ref 0–35)
AST: 64 U/L — ABNORMAL HIGH (ref 0–37)
Albumin: 4.5 g/dL (ref 3.5–5.2)
Alkaline Phosphatase: 67 U/L (ref 39–117)
BUN: 15 mg/dL (ref 6–23)
CO2: 27 meq/L (ref 19–32)
Calcium: 9.5 mg/dL (ref 8.4–10.5)
Chloride: 104 meq/L (ref 96–112)
Creatinine, Ser: 0.49 mg/dL (ref 0.40–1.20)
GFR: 100.89 mL/min (ref >60.00)
Glucose, Bld: 106 mg/dL — ABNORMAL HIGH (ref 70–99)
Potassium: 4.2 meq/L (ref 3.5–5.1)
Sodium: 143 meq/L (ref 135–145)
Total Bilirubin: 0.3 mg/dL (ref 0.2–1.2)
Total Protein: 7.5 g/dL (ref 6.0–8.3)

## 2024-04-22 LAB — CBC WITH DIFFERENTIAL/PLATELET
Basophils Absolute: 0 K/uL (ref 0.0–0.1)
Basophils Relative: 0.3 % (ref 0.0–3.0)
Eosinophils Absolute: 0.1 K/uL (ref 0.0–0.7)
Eosinophils Relative: 3.1 % (ref 0.0–5.0)
HCT: 35.9 % — ABNORMAL LOW (ref 36.0–46.0)
Hemoglobin: 11.2 g/dL — ABNORMAL LOW (ref 12.0–15.0)
Lymphocytes Relative: 53.7 % — ABNORMAL HIGH (ref 12.0–46.0)
Lymphs Abs: 2.5 K/uL (ref 0.7–4.0)
MCHC: 31.2 g/dL (ref 30.0–36.0)
MCV: 73.1 fl — ABNORMAL LOW (ref 78.0–100.0)
Monocytes Absolute: 0.4 K/uL (ref 0.1–1.0)
Monocytes Relative: 8.7 % (ref 3.0–12.0)
Neutro Abs: 1.6 K/uL (ref 1.4–7.7)
Neutrophils Relative %: 34.2 % — ABNORMAL LOW (ref 43.0–77.0)
Platelets: 279 K/uL (ref 150.0–400.0)
RBC: 4.9 Mil/uL (ref 3.87–5.11)
RDW: 15.5 % (ref 11.5–15.5)
WBC: 4.6 K/uL (ref 4.0–10.5)

## 2024-04-22 LAB — TROPONIN I (HIGH SENSITIVITY): High Sens Troponin I: 4 ng/L (ref 2–17)

## 2024-04-22 LAB — POCT INFLUENZA A/B
Influenza A, POC: NEGATIVE
Influenza B, POC: NEGATIVE

## 2024-04-22 LAB — TSH: TSH: 0.79 u[IU]/mL (ref 0.35–5.50)

## 2024-04-22 LAB — MAGNESIUM: Magnesium: 1.4 mg/dL — ABNORMAL LOW (ref 1.5–2.5)

## 2024-04-22 LAB — POC COVID19 BINAXNOW: SARS Coronavirus 2 Ag: NEGATIVE

## 2024-04-22 MED ORDER — PANTOPRAZOLE SODIUM 40 MG PO TBEC
40.0000 mg | DELAYED_RELEASE_TABLET | Freq: Two times a day (BID) | ORAL | 0 refills | Status: DC
  Filled 2024-04-22: qty 60, 30d supply, fill #0

## 2024-04-22 NOTE — Progress Notes (Signed)
 Called back pt and gave her results, scheduled lab appt instead

## 2024-04-22 NOTE — Telephone Encounter (Signed)
 Copied from CRM [id number]. Topic: Appointments - Appointment Scheduling >> Apr 22, 2024  3:44 PM Alfonso ORN wrote: Pt advised to schedule for liver function f/u with pcp on Friday but works until 4pm. Please contact pt to advise on appt/ f/u for liver function .

## 2024-04-22 NOTE — Progress Notes (Signed)
 Needs follow up Friday please for elevated liver function tests.  I will recheck her liver function at that time.

## 2024-04-22 NOTE — Progress Notes (Signed)
 Subjective:     Patient ID: [name], female    DOB: 10-16-1960, 63 y.o.   MRN: [id number]  Chief Complaint  Patient presents with   Acute Visit    Throwing up starting yesterday, today having chills. Overall still not feeling well. Was out of work yesterday    HPI  Discussed the use of AI scribe software for clinical note transcription with the patient, who gave verbal consent to proceed.  History of Present Illness [name] is a 63 year old female who presents with nausea, vomiting, diarrhea, chills, and body aches for three days.  Gastrointestinal symptoms - Nausea, vomiting, and diarrhea for three days - Several episodes of diarrhea and vomiting, including this morning - Able to drink fluids and keep them down - Continues to have diarrhea - No abdominal pain  Constitutional symptoms - Chills and body aches for three days - Feels cold with chills - No fever reported  Neurological symptoms - Headache and dizziness yesterday - Dizziness described as a near-fainting sensation  Respiratory and cardiovascular symptoms - No chest pain, palpitations, shortness of breath, or wheezing  Immunization status and exposure risk - Works in a school environment - No known sick contacts - Received flu shot and COVID booster one month ago  Genitourinary symptoms - Urine is light yellow  Gastroesophageal reflux disease - Takes pantoprazole  for acid reflux - Experiences significant acid reflux without pantoprazole  due to running out of refills    Health Maintenance Due  Topic Date Due   Influenza Vaccine  01/25/2024   COVID-19 Vaccine (4 - 2025-26 season) 02/25/2024    Past Medical History:  Diagnosis Date   Anemia    Arthritis    neck, hips (04/25/2018)   Claustrophobia    Daily headache    no longer a problem   GERD (gastroesophageal reflux disease)    Hiatal hernia    Hyperlipidemia    hx (04/25/2018)   Hypertension    Plantar fasciitis     hx - left foot   Pneumonia    Stroke (HCC) 05/2014   mini stroke (04/25/2018)    Past Surgical History:  Procedure Laterality Date   ABDOMINAL HYSTERECTOMY  12/09   Secondary to fibroids   ARTERY BIOPSY Right 04/10/2018   Procedure: BIOPSY TEMPORAL ARTERY;  Surgeon: Oris Krystal FALCON, MD;  Location: [facility] OR;  Service: Vascular;  Laterality: Right;   CESAREAN SECTION  1986; 1988   COLONOSCOPY     CONDYLOMA EXCISION/FULGURATION N/A 11/30/2023   Procedure: REMOVAL, CONDYLOMA;  Surgeon: Teresa Lonni HERO, MD;  Location: [facility] ORS;  Service: General;  Laterality: N/A;  Excision of hypertrophic anal papilla   FOOT SURGERY Left 03/2017   Bone Spurs Removed   INJECTION, CHEMODENERVATION AGENT N/A 11/30/2023   Procedure: INJECTION, CHEMODENERVATION AGENT;  Surgeon: Teresa Lonni HERO, MD;  Location: [facility];  Service: General;  Laterality: N/A;   LUMBAR LAMINECTOMY/DECOMPRESSION MICRODISCECTOMY N/A 04/13/2021   Procedure: LUMBAR FOUR- LUMBAR FIVE AND LUMBAR FIVE-SACRAL ONE  DECOMPRESSION;  Surgeon: Barbarann Oneil BROCKS, MD;  Location: [facility] OR;  Service: Orthopedics;  Laterality: N/A;   RADIOLOGY WITH ANESTHESIA N/A 04/26/2018   Procedure: MRI WITH ANESTHESIA;  Surgeon: Radiologist, Medication, MD;  Location: [facility] OR;  Service: Radiology;  Laterality: N/A;   RADIOLOGY WITH ANESTHESIA N/A 08/24/2019   Procedure: MRI WITH ANESTHESIA CERVICAL SPINE;  Surgeon: Radiologist, Medication, MD;  Location: [facility] OR;  Service: Radiology;  Laterality: N/A;   RADIOLOGY WITH ANESTHESIA N/A 10/02/2019  Procedure: MRI WITH ANESTHESIA CERVICAL WITHOUT CONTRAST;  Surgeon: Radiologist, Medication, MD;  Location: [facility] OR;  Service: Radiology;  Laterality: N/A;   RECTAL EXAM UNDER ANESTHESIA N/A 11/30/2023   Procedure: EXAM UNDER ANESTHESIA, RECTUM;  Surgeon: Teresa Lonni HERO, MD;  Location: [facility];  Service: General;  Laterality: N/A;   TUBAL LIGATION  1988    Family History  Problem Relation Age of Onset   Pancreatic cancer Mother  47   Heart disease Mother    Esophageal cancer Father 43   Heart disease Sister    Breast cancer Maternal Aunt 60   Colon cancer Maternal Uncle    Pancreatic cancer Maternal Uncle    Colon cancer Paternal Uncle    Rectal cancer Neg Hx    Stomach cancer Neg Hx     Social History   Socioeconomic History   Marital status: Divorced    Spouse name: Not on file   Number of children: 2   Years of education: Not on file   Highest education level: Not on file  Occupational History   Not on file  Tobacco Use   Smoking status: Some Days    Current packs/day: 0.25    Average packs/day: 0.3 packs/day for 38.0 years (9.5 ttl pk-yrs)    Types: Cigarettes   Smokeless tobacco: Never   Tobacco comments:    1 cigs/day 08/17/2022 km,cma  Vaping Use   Vaping status: Never Used  Substance and Sexual Activity   Alcohol use: Yes    Alcohol/week: 4.0 standard drinks of alcohol    Types: 4 Cans of beer per week   Drug use: Not Currently   Sexual activity: Not Currently    Birth control/protection: Surgical    Comment: Hysterectomy  Other Topics Concern   Not on file  Social History Narrative   10 th grade education.   Social Drivers of Corporate Investment Banker Strain: Not on file  Food Insecurity: Not on file  Transportation Needs: No Transportation Needs (08/27/2019)   PRAPARE - Administrator, Civil Service (Medical): No    Lack of Transportation (Non-Medical): No  Physical Activity: Not on file  Stress: Not on file  Social Connections: Not on file  Intimate Partner Violence: Not on file    Outpatient Medications Prior to Visit  Medication Sig Dispense Refill   methocarbamol  (ROBAXIN ) 500 MG tablet Take 1 tablet (500 mg total) by mouth 3 (three) times daily. 90 tablet 0   albuterol  (VENTOLIN  HFA) 108 (90 Base) MCG/ACT inhaler Inhale 2 puffs into the lungs every 6 (six) hours as needed for wheezing or shortness of breath (Cough). 18 g 2   amLODipine  (NORVASC ) 5 MG  tablet Take 1 tablet (5 mg total) by mouth daily. 90 tablet 1   diazepam  (VALIUM ) 5 MG tablet Take 1 by mouth 1 hour  pre-procedure with very light food. Do not drive motor vehicle. 1 tablet 0   folic acid  (FOLVITE ) 1 MG tablet Take 1 tablet (1 mg total) by mouth daily. 90 tablet 1   guaifenesin  (HUMIBID E) 400 MG TABS tablet Take 1 tablet 3 times daily as needed for chest congestion and cough 30 tablet 0   HYDROcodone -acetaminophen  (NORCO/VICODIN) 5-325 MG tablet Take 1 tablet by mouth every 8 (eight) hours as needed for moderate pain (pain score 4-6). 20 tablet 0   lidocaine  (XYLOCAINE ) 5 % ointment APPLY 1 APPLICATION TOPICALLY TWICE DAILY AS NEEDED 36 g 0   lipase/protease/amylase (CREON ) 36000 UNITS  CPEP capsule Take 3 capsules (108,000 Units total) by mouth 3 (three) times daily with meals AND 2 capsules (72,000 Units total) with snacks. (Patient taking differently: Take 4 capsules by mouth 3 times daily with meals AND 2 capsules with snacks.) 390 capsule 11   nicotine  (NICODERM CQ  - DOSED IN MG/24 HOURS) 14 mg/24hr patch Place 1 patch (14 mg total) onto the skin daily. 28 patch 0   nicotine  polacrilex (NICORETTE ) 4 MG gum Take 1 each (4 mg total) by mouth as needed for smoking cessation. 100 tablet 0   Omega 3 1000 MG CAPS Take 2 g by mouth daily.     omega-3 acid ethyl esters (LOVAZA ) 1 g capsule Take 2 capsules (2 g total) by mouth 2 (two) times daily. 120 capsule 1   ondansetron  (ZOFRAN -ODT) 8 MG disintegrating tablet Take 1 tablet (8 mg total) by mouth every 8 (eight) hours as needed for nausea or vomiting. 15 tablet 0   potassium chloride  SA (KLOR-CON  M) 20 MEQ tablet Take 1 tablet (20 mEq total) by mouth daily. 30 tablet 3   prochlorperazine  (COMPAZINE ) 10 MG tablet Take 1 tablet (10 mg total) by mouth 2 (two) times daily as needed for nausea or vomiting. 10 tablet 0   promethazine -dextromethorphan (PROMETHAZINE -DM) 6.25-15 MG/5ML syrup Take 5 mLs by mouth at bedtime as needed for cough.  60 mL 0   traMADol  (ULTRAM ) 50 MG tablet Take 1 tablet (50 mg total) by mouth every 8 (eight) hours as needed. (Patient not taking: Reported on 03/05/2024) 20 tablet 0   Vitamin D , Ergocalciferol , (DRISDOL ) 1.25 MG (50000 UNIT) CAPS capsule Take 1 capsule (50,000 Units total) by mouth every 7 (seven) days. 6 capsule 1   pantoprazole  (PROTONIX ) 40 MG tablet Take 1 tablet (40 mg total) by mouth 2 (two) times daily. Take 30 minutes before breakfast-NEEDS APPT FOR FURTHER REFILLS 60 tablet 0   predniSONE  (DELTASONE ) 20 MG tablet Take 2 tablets (40 mg total) by mouth daily with breakfast. 10 tablet 0   No facility-administered medications prior to visit.    Allergies  Allergen Reactions   Aspirin Hives   Hydromorphone Hcl Hives and Nausea And Vomiting    Can take Norco   Crestor  [Rosuvastatin ]     myalgias   Latex Rash    ROS PER HPI    Objective:    Physical Exam Constitutional:      General: She is not in acute distress.    Appearance: She is ill-appearing.  Eyes:     Extraocular Movements: Extraocular movements intact.     Conjunctiva/sclera: Conjunctivae normal.     Pupils: Pupils are equal, round, and reactive to light.  Cardiovascular:     Rate and Rhythm: Regular rhythm. Tachycardia present.  Pulmonary:     Effort: Pulmonary effort is normal.     Breath sounds: Normal breath sounds.  Abdominal:     General: Bowel sounds are normal. There is no distension.     Palpations: Abdomen is soft.     Tenderness: There is no abdominal tenderness. There is no guarding or rebound.  Musculoskeletal:        General: Normal range of motion.     Cervical back: Normal range of motion and neck supple. No tenderness.     Right lower leg: No edema.     Left lower leg: No edema.  Lymphadenopathy:     Cervical: No cervical adenopathy.  Skin:    General: Skin is warm and dry.  Coloration: Skin is not pale.  Neurological:     General: No focal deficit present.     Mental Status: She  is alert and oriented to person, place, and time.     Cranial Nerves: No cranial nerve deficit.     Coordination: Coordination normal.     Gait: Gait normal.  Psychiatric:        Mood and Affect: Mood normal.        Behavior: Behavior normal.        Thought Content: Thought content normal.      BP 122/84   Pulse (!) 105   Temp 98.6 F (37 C) (Oral)   Ht 4' 11 (1.499 m)   Wt 137 lb (62.1 kg)   LMP  (LMP Unknown)   SpO2 96%   BMI 27.67 kg/m  Wt Readings from Last 3 Encounters:  04/22/24 137 lb (62.1 kg)  02/28/24 140 lb (63.5 kg)  02/14/24 139 lb (63 kg)   Physical Exam       Assessment & Plan:   Problem List Items Addressed This Visit     GERD (Chronic)   Relevant Medications   pantoprazole  (PROTONIX ) 40 MG tablet   Other Visit Diagnoses       Nausea and vomiting, unspecified vomiting type    -  Primary   Relevant Orders   CBC with Differential/Platelet (Completed)   Comprehensive metabolic panel with GFR (Completed)   Magnesium  (Completed)   POC COVID-19 (Completed)   POCT Influenza A/B (Completed)   Troponin I (High Sensitivity) (Completed)     Chills       Relevant Orders   CBC with Differential/Platelet (Completed)   Comprehensive metabolic panel with GFR (Completed)   POC COVID-19 (Completed)   POCT Influenza A/B (Completed)     Body aches       Relevant Orders   POC COVID-19 (Completed)   POCT Influenza A/B (Completed)     Diarrhea, unspecified type       Relevant Orders   Magnesium  (Completed)   TSH (Completed)   POC COVID-19 (Completed)   POCT Influenza A/B (Completed)     Tachycardia       Relevant Orders   Magnesium  (Completed)   TSH (Completed)   EKG 12-Lead   Troponin I (High Sensitivity) (Completed)     EKG abnormalities       Relevant Orders   Troponin I (High Sensitivity) (Completed)   ECHOCARDIOGRAM COMPLETE       Assessment and Plan Assessment & Plan Acute gastroenteritis with nausea, vomiting, and diarrhea Presents  with acute gastroenteritis characterized by nausea, vomiting, diarrhea, chills, and body aches for three days. Reports several episodes of diarrhea and vomiting. Able to drink fluids but continues to experience nausea and vomiting. No abdominal pain or tenderness. - Advise use of ondansetron  for nausea as needed. - Check laboratory tests to assess electrolyte levels due to vomiting and diarrhea. - Monitor hydration status and advise increased fluid intake with electrolytes. - Advise to seek emergency care if symptoms worsen or if signs of dehydration occur.  Tachycardia Tachycardia likely secondary to dehydration from vomiting and diarrhea. Reports feeling dizzy and faint yesterday, but no current chest pain, palpitations, or shortness of breath. - Monitor heart rate and hydration status. - Perform EKG to assess cardiac status. - EKG shows NSR, rate 92, Q waves in anterior leads, no acute ischemic pattern  - check troponin, CBC, CMP, magnesium , TSH - Echocardiogram ordered to assess cardiac function  Gastroesophageal reflux disease (GERD) GERD with increased symptoms due to running out of high-dose pantoprazole . Concern about long-term use of high-dose pantoprazole  affecting calcium  absorption. - Refill pantoprazole  prescription. - Advise scheduling an appointment with her gastroenterologist to reassess GERD management and medication use.    I have discontinued Loella P. Geck's predniSONE . I have also changed her pantoprazole . Additionally, I am having her maintain her lipase/protease/amylase, ondansetron , prochlorperazine , lidocaine , amLODipine , omega-3 acid ethyl esters, nicotine , Vitamin D  (Ergocalciferol ), traMADol , potassium chloride  SA, nicotine  polacrilex, Omega 3, HYDROcodone -acetaminophen , diazepam , albuterol , guaifenesin , promethazine -dextromethorphan, methocarbamol , and folic acid .  Meds ordered this encounter  Medications   pantoprazole  (PROTONIX ) 40 MG tablet    Sig: Take  1 tablet (40 mg total) by mouth 2 (two) times daily. Take 30 minutes before breakfast    Dispense:  60 tablet    Refill:  0    Supervising Provider:   ROLLENE NORRIS A [[id number]]

## 2024-04-22 NOTE — Patient Instructions (Signed)
 Your COVID and flu tests are negative today.  Please go downstairs for labs before you leave.  Increase your fluid intake and take the ondansetron  (Zofran ) for nausea.  If you feel dehydrated, have any more episodes of feeling like you are going to pass out, chest pain, shortness of breath, or any other worrisome symptoms, please go to the emergency department.  I will be in touch with your results.

## 2024-04-24 ENCOUNTER — Other Ambulatory Visit: Payer: Self-pay | Admitting: Physical Medicine and Rehabilitation

## 2024-04-24 ENCOUNTER — Other Ambulatory Visit (HOSPITAL_COMMUNITY): Payer: Self-pay

## 2024-04-24 ENCOUNTER — Telehealth: Payer: Self-pay | Admitting: Physical Medicine and Rehabilitation

## 2024-04-24 NOTE — Telephone Encounter (Signed)
 Pt called stating that her pain level is above a 10 and that she was told to call back if it got that bad.Pharmacy [facility]. Pt call back number is [phone]

## 2024-04-24 NOTE — Telephone Encounter (Signed)
 [id number] with instructions and for patient to call back

## 2024-04-25 ENCOUNTER — Other Ambulatory Visit (HOSPITAL_COMMUNITY): Payer: Self-pay

## 2024-04-25 ENCOUNTER — Other Ambulatory Visit (INDEPENDENT_AMBULATORY_CARE_PROVIDER_SITE_OTHER)

## 2024-04-25 DIAGNOSIS — R7989 Other specified abnormal findings of blood chemistry: Secondary | ICD-10-CM | POA: Diagnosis not present

## 2024-04-28 ENCOUNTER — Telehealth: Payer: Self-pay | Admitting: Physical Medicine and Rehabilitation

## 2024-04-28 ENCOUNTER — Other Ambulatory Visit (HOSPITAL_COMMUNITY): Payer: Self-pay

## 2024-04-28 ENCOUNTER — Encounter: Payer: Self-pay | Admitting: Radiology

## 2024-04-28 NOTE — Telephone Encounter (Signed)
 Patient called. Says she is in a lot of pain. Would like to know what she could do?

## 2024-04-29 ENCOUNTER — Telehealth: Payer: Self-pay

## 2024-04-29 DIAGNOSIS — R7989 Other specified abnormal findings of blood chemistry: Secondary | ICD-10-CM

## 2024-04-29 NOTE — Telephone Encounter (Signed)
 Copied from CRM #[id number]. Topic: Clinical - Medical Advice >> Apr 29, 2024  3:25 PM [name] wrote: Reason for CRM: Patient stated she can't make it for them to draw her blood today since they lost it. But she can make it sometime Friday!

## 2024-05-05 ENCOUNTER — Encounter: Payer: Self-pay | Admitting: Physical Therapy

## 2024-05-05 ENCOUNTER — Ambulatory Visit (INDEPENDENT_AMBULATORY_CARE_PROVIDER_SITE_OTHER): Admitting: Physical Therapy

## 2024-05-05 DIAGNOSIS — R262 Difficulty in walking, not elsewhere classified: Secondary | ICD-10-CM | POA: Diagnosis not present

## 2024-05-05 DIAGNOSIS — M5459 Other low back pain: Secondary | ICD-10-CM | POA: Diagnosis not present

## 2024-05-05 DIAGNOSIS — M6281 Muscle weakness (generalized): Secondary | ICD-10-CM

## 2024-05-05 NOTE — Therapy (Signed)
 OUTPATIENT PHYSICAL THERAPY THORACOLUMBAR EVALUATION   Patient Name: [name] MRN: [id number] DOB:[birth year], 63 y.o., female Today's Date: 05/05/2024  END OF SESSION:  PT End of Session - 05/05/24 1622     Visit Number 1    Number of Visits 20    Date for Recertification  07/18/24    PT Start Time 1535    PT Stop Time 1625    PT Time Calculation (min) 50 min    Activity Tolerance Patient tolerated treatment well    Behavior During Therapy [facility] for tasks assessed/performed          Past Medical History:  Diagnosis Date   Anemia    Arthritis    neck, hips (04/25/2018)   Claustrophobia    Daily headache    no longer a problem   GERD (gastroesophageal reflux disease)    Hiatal hernia    Hyperlipidemia    hx (04/25/2018)   Hypertension    Plantar fasciitis    hx - left foot   Pneumonia    Stroke (HCC) 05/2014   mini stroke (04/25/2018)   Past Surgical History:  Procedure Laterality Date   ABDOMINAL HYSTERECTOMY  12/09   Secondary to fibroids   ARTERY BIOPSY Right 04/10/2018   Procedure: BIOPSY TEMPORAL ARTERY;  Surgeon: Oris Krystal FALCON, MD;  Location: [facility] OR;  Service: Vascular;  Laterality: Right;   CESAREAN SECTION  1986; 1988   COLONOSCOPY     CONDYLOMA EXCISION/FULGURATION N/A 11/30/2023   Procedure: REMOVAL, CONDYLOMA;  Surgeon: Teresa Lonni HERO, MD;  Location: [facility] ORS;  Service: General;  Laterality: N/A;  Excision of hypertrophic anal papilla   FOOT SURGERY Left 03/2017   Bone Spurs Removed   INJECTION, CHEMODENERVATION AGENT N/A 11/30/2023   Procedure: INJECTION, CHEMODENERVATION AGENT;  Surgeon: Teresa Lonni HERO, MD;  Location: [facility] OR[facility];  Service: General;  Laterality: N/A;   LUMBAR LAMINECTOMY/DECOMPRESSION MICRODISCECTOMY N/A 04/13/2021   Procedure: LUMBAR FOUR- LUMBAR FIVE AND LUMBAR FIVE-SACRAL ONE  DECOMPRESSION;  Surgeon: Barbarann Oneil BROCKS, MD;  Location: [facility] OR;  Service: Orthopedics;  Laterality: N/A;   RADIOLOGY WITH ANESTHESIA N/A  04/26/2018   Procedure: MRI WITH ANESTHESIA;  Surgeon: Radiologist, Medication, MD;  Location: [facility] OR;  Service: Radiology;  Laterality: N/A;   RADIOLOGY WITH ANESTHESIA N/A 08/24/2019   Procedure: MRI WITH ANESTHESIA CERVICAL SPINE;  Surgeon: Radiologist, Medication, MD;  Location: [facility] OR;  Service: Radiology;  Laterality: N/A;   RADIOLOGY WITH ANESTHESIA N/A 10/02/2019   Procedure: MRI WITH ANESTHESIA CERVICAL WITHOUT CONTRAST;  Surgeon: Radiologist, Medication, MD;  Location: [facility] OR;  Service: Radiology;  Laterality: N/A;   RECTAL EXAM UNDER ANESTHESIA N/A 11/30/2023   Procedure: EXAM UNDER ANESTHESIA, RECTUM;  Surgeon: Teresa Lonni HERO, MD;  Location: [facility] OR[facility];  Service: General;  Laterality: N/A;   TUBAL LIGATION  1988   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   Cough due to ACE inhibitor 05/16/2022   Myalgia 05/16/2022   Hypomagnesemia 03/19/2022   Vitamin B 12 deficiency 03/19/2022   Low TSH level 03/19/2022   Right elbow pain 03/19/2022   Encounter for screening mammogram for malignant  neoplasm of breast 03/19/2022   History of hypertension 12/13/2021   History of smoking 12/13/2021   Elevated blood pressure reading 12/13/2021   Contusion of right knee 12/13/2021   Fall 12/13/2021   Muscle cramps 12/04/2021   Elevated liver enzymes 12/04/2021   Paresthesias 12/04/2021   Elevated LFTs 12/04/2021   Anemia 10/20/2021   Chronic foot pain 10/20/2021   Folic acid  deficiency 09/19/2021   Chronic pain syndrome 09/07/2021   Status post lumbar spine surgery for decompression of spinal cord 05/24/2021   Dyspepsia  05/23/2021   History of gastroesophageal reflux (GERD) 05/23/2021   History of lumbar laminectomy for spinal cord decompression 04/26/2021   Lumbar stenosis 04/13/2021   Tension headache 12/22/2020   Low back pain 10/20/2020   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/28/2020   Hypokalemia 12/17/2019   Chronic diarrhea 09/14/2017   History of stroke 06/12/2014   Essential hypertension, benign 01/24/2010   Microcytic anemia 01/11/2010   Hyperlipemia 05/28/2007   Current smoker 05/28/2007   GERD 05/28/2007    PCP: Lendia Boby CROME, NP-C   REFERRING PROVIDER: Trudy Duwaine BRAVO, NP   REFERRING DIAG: IMPRESSION: Postlaminectomy changes at L4-5 and L5-S1. There is no significant residual spinal stenosis. There is stable moderate foraminal narrowing at L4-5 and L5-S1 bilaterally. Correlation for mild radicular symptoms.  Rationale for Evaluation and Treatment: Rehabilitation  THERAPY DIAG:  Other low back pain  Muscle weakness (generalized)  Difficulty in walking, not elsewhere classified  ONSET DATE: years  SUBJECTIVE:                                                                                                                                                                                           SUBJECTIVE STATEMENT:  [name] is a 63 y.o. female who comes in today for evaluation of chronic, worsening and severe bilateral lower back pain radiating to left buttock and down posterior leg to foot. Pain ongoing for several years.   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 No  LIVING ENVIRONMENT: Lives with:  lives with their family and lives with an adult companion Lives in: House/apartment Stairs: No Has following equipment at home: Ramped entry  OCCUPATION: custodian at [employer]  PLOF: Independent  PATIENT GOALS: Stop this pain  Next MD Visit:    OBJECTIVE:  DIAGNOSTIC FINDINGS: from 11/10/23 IMPRESSION: Postlaminectomy changes at L4-5 and L5-S1. There is no significant residual spinal stenosis. There is stable moderate foraminal narrowing at L4-5 and L5-S1 bilaterally. Correlation for mild radicular symptoms.  PATIENT SURVEYS:  Patient-Specific Activity Scoring Scheme  0 represents "unable to perform." 10 represents "able to perform at prior level. 0 1 2 3 4 5 6 7 8 9  10 (Date and Score)   Activity Eval     1. working 3     2. cleaning  3    3. driving 3   4.bending 2   5.    Score 2.75    Total score = sum of the activity scores/number of activities Minimum detectable change (90%CI) for average score = 2 points Minimum detectable change (90%CI) for single activity score = 3 points  SCREENING FOR RED FLAGS: Bowel or bladder incontinence: No Cauda equina syndrome: No  COGNITION: Overall cognitive status: WFL normal      SENSATION: WFL  POSTURE:  rounded shoulders, forward head, and increased lumbar lordosis  PALPATION: TTP: lumbar paraspinals dn left QL  LUMBAR ROM:   Directional Preference Assessment: Centralization: extension Peripheralization:  flexion  AROM Eval 05/05/24  Flexion 30 c severe pain  Extension 15  Right lateral flexion 30  Left lateral flexion 28  Right rotation Limited 50%  Left rotation Limited 50%   (Blank rows = not tested)  LOWER EXTREMITY ROM:     A= active P= passive Right Eval 05/05/24 Left Eval 05/05/24  Hip flexion A: 102 A: 95  Hip extension    Hip abduction    Hip adduction    Hip internal rotation    Hip external rotation    Knee flexion A: 125 A: 125  Knee extension A: 0 A: 0  Ankle  dorsiflexion    Ankle plantarflexion    Ankle inversion    Ankle eversion     (Blank rows = not tested)  LOWER EXTREMITY MMT:    MMT Right Eval 05/05/24 HHD in pounds Left Eval 05/05/24 HHD in pounds  Hip flexion 15.9#, 14.5# 14.3#, 13.7#  Hip extension    Hip abduction    Hip adduction    Hip internal rotation    Hip external rotation    Knee flexion 15.8# 17.0# 13.4# 17.1#  Knee extension 29.3# 25.1# 19.5# 13.7#  Ankle dorsiflexion    Ankle plantarflexion    Ankle inversion    Ankle eversion     (Blank rows = not tested)  LUMBAR SPECIAL TESTS:  05/05/24:  Slump test: Positive on left   FUNCTIONAL TESTS:  05/05/24:  5 times sit to stand: 16.8 seconds with UE support  GAIT: 05/05/24: antalgic gait  TODAY'S TREATMENT:                                                                                                         DATE: 05/05/24  Therex: HEP instruction/performance c cues for techniques, handout provided.  Trial set performed of each for comprehension and symptom assessment.  See below for exercise list Self Care:  Sleeping positions in side-lying with pillows for support  PATIENT EDUCATION:  Education details: HEP, POC Person educated: Patient Education method: Explanation, Demonstration, Verbal cues, and Handouts Education comprehension: verbalized understanding, returned demonstration, and verbal cues required  HOME EXERCISE PROGRAM: Access Code: [email or web address] URL: [email or web address] Date: 05/05/2024 Prepared by: Delon Lunger  Exercises - Supine Posterior Pelvic Tilt  - 2 x daily - 7 x weekly - 10 reps - 5 seconds hold - Supine Lower Trunk Rotation  - 2 x daily - 7 x weekly - 3 reps - 20-30 seconds hold - Supine Piriformis  Stretch with Foot on Ground  - 2 x daily - 7 x weekly - 3 reps - 20-30 seconds hold - Seated Hamstring Stretch  - 2 x daily - 7 x weekly - 3 reps - 20-30 seconds hold - Standing Lumbar Extension at Wall - Forearms  - 2 x daily - 7 x weekly - 10 reps - 5 seconds hold  ASSESSMENT:  CLINICAL IMPRESSION: Patient is a .63 y.o. who comes to clinic with complaints of low back pain with mobility, strength and movement coordination deficits that impair their ability to perform usual daily and recreational functional activities without increase difficulty/symptoms at this time.  Patient to benefit from skilled PT services to address impairments and limitations to improve to previous level of function without restriction secondary to condition.   OBJECTIVE IMPAIRMENTS: decreased balance, decreased mobility, difficulty walking, decreased ROM, decreased strength, impaired flexibility, and pain.   ACTIVITY LIMITATIONS: lifting, bending, sitting, standing, squatting, sleeping, stairs, and transfers  PARTICIPATION LIMITATIONS: driving, community activity, and occupation  PERSONAL FACTORS: see PMH above are also affecting patient's functional outcome.   REHAB POTENTIAL: Good  CLINICAL DECISION MAKING: Stable/uncomplicated  EVALUATION COMPLEXITY: Low   GOALS: Goals reviewed with patient? Yes  SHORT TERM GOALS: (target date for Short term goals are 3 weeks 05/26/2024)  1. Patient will demonstrate independent use of home exercise program to maintain progress from in clinic treatments.  Goal status: New  LONG TERM GOALS: (target dates for all long term goals are 10 weeks  07/18/2024 )   1. Patient will demonstrate/report pain at worst less than or equal to 2/10 to facilitate minimal limitation in daily activity secondary to pain symptoms.  Goal status: New   2. Patient will demonstrate independent use of home exercise program to facilitate ability to maintain/progress functional gains from skilled  physical therapy services.  Goal status: New   3. Patient will demonstrate Patient specific functional scale avg > or = 4.75 to indicate reduced disability due to condition.   Goal status: New   4. Pt will improve her trunk flexion to >= 50 degrees in order  to improve work skills and functional mobility.   Goal status: New   5.   Pt will be able to perform 5 time sit to stand in </= 12 seconds c/s UE support.  Goal status: New   6.  Pt will improve her bil LE strength to >/= 10 pounds using HHD for improved gait and functional mobility.  Goal status: New     PLAN:  PT FREQUENCY: 1-2x/week  PT DURATION: 10 weeks  PLANNED INTERVENTIONS: Can include 02853- PT Re-evaluation, 97110-Therapeutic exercises, 97530- Therapeutic activity, [id number]- Neuromuscular re-education, 97535- Self Care, 97140- Manual therapy, [phone]- Gait training, [phone]- Orthotic Fit/training, [phone]- Canalith repositioning, [id number]- Aquatic Therapy, [phone]- Electrical stimulation (unattended), [id number] Physical performance testing, 97016- Vasopneumatic device, [id number]- Ultrasound, [id number]- Traction (mechanical), [id number]- Ionotophoresis 4mg /ml Dexamethasone ,  79439 - Needle insertion w/o injection 1 or 2 muscles, 20561 - Needle insertion w/o injection 3 or more muscles.    Patient/Family education, Balance training, Stair training, Taping, Dry Needling, Joint mobilization, Joint manipulation, Spinal manipulation, Spinal mobilization, Scar mobilization, Vestibular training, Visual/preceptual remediation/compensation, DME instructions, Cryotherapy, and Moist heat.  All performed as medically necessary.  All included unless contraindicated  PLAN FOR NEXT SESSION: Review HEP knowledge/results. Core strengthening, manual as needed Try E-stim per pt request     Delon JONELLE Lunger, PT , MPT 05/05/2024, 4:33 PM

## 2024-05-06 NOTE — Telephone Encounter (Signed)
Lab orders replaced.

## 2024-05-06 NOTE — Addendum Note (Signed)
 Addended by: Shailey Butterbaugh E on: 05/06/2024 02:04 PM   Modules accepted: Orders

## 2024-05-14 ENCOUNTER — Telehealth: Payer: Self-pay | Admitting: Nurse Practitioner

## 2024-05-14 NOTE — Telephone Encounter (Signed)
 Last seen in office August, 2024 - would like refill of Lidocaine  ointment.  Please advise.

## 2024-05-14 NOTE — Telephone Encounter (Signed)
 Inbound call from patient stating she would like to know if she can be sent a refill on lidocaine  ointment.  Please advise  Thank you

## 2024-05-15 ENCOUNTER — Other Ambulatory Visit: Payer: Self-pay | Admitting: Physical Medicine and Rehabilitation

## 2024-05-15 ENCOUNTER — Telehealth: Payer: Self-pay | Admitting: Physical Medicine and Rehabilitation

## 2024-05-15 DIAGNOSIS — M5416 Radiculopathy, lumbar region: Secondary | ICD-10-CM

## 2024-05-15 DIAGNOSIS — M7918 Myalgia, other site: Secondary | ICD-10-CM

## 2024-05-15 DIAGNOSIS — G894 Chronic pain syndrome: Secondary | ICD-10-CM

## 2024-05-15 NOTE — Telephone Encounter (Signed)
 Pt called requesting a referral be sent for pain management as soon as possible. Pt phone number is [phone].

## 2024-05-15 NOTE — Telephone Encounter (Signed)
 [name], ok to refill Lidocaine  ointment x 1 as previously prescribed without further refills. If patient's anal/hemorrhoidal symptoms persist she should be seen in office. THX

## 2024-05-16 MED ORDER — LIDOCAINE 5 % EX OINT: TOPICAL_OINTMENT | Freq: Every day | CUTANEOUS | 0 refills | Status: AC | PRN

## 2024-05-16 NOTE — Telephone Encounter (Signed)
Lidocaine refilled.  

## 2024-05-26 ENCOUNTER — Other Ambulatory Visit (HOSPITAL_COMMUNITY): Payer: Self-pay

## 2024-05-26 ENCOUNTER — Other Ambulatory Visit: Payer: Self-pay | Admitting: Family Medicine

## 2024-05-26 ENCOUNTER — Telehealth: Payer: Self-pay

## 2024-05-26 MED ORDER — PANTOPRAZOLE SODIUM 40 MG PO TBEC
40.0000 mg | DELAYED_RELEASE_TABLET | Freq: Two times a day (BID) | ORAL | 0 refills | Status: AC
  Filled 2024-05-26: qty 180, 90d supply, fill #0

## 2024-05-26 NOTE — Telephone Encounter (Signed)
 Rx sent.

## 2024-05-26 NOTE — Telephone Encounter (Signed)
 Copied from CRM #[id number]. Topic: Clinical - Prescription Issue >> May 26, 2024 10:26 AM Charolett L wrote: Reason for CRM: Patient stated that a new prescription needs to be sent in for pantoprazole  (PROTONIX ) 40 MG tablet because its out of refills

## 2024-05-27 ENCOUNTER — Encounter: Admitting: Physical Therapy

## 2024-05-27 ENCOUNTER — Other Ambulatory Visit: Payer: Self-pay

## 2024-05-28 ENCOUNTER — Other Ambulatory Visit: Payer: Self-pay | Admitting: Physical Medicine and Rehabilitation

## 2024-05-28 ENCOUNTER — Telehealth: Payer: Self-pay | Admitting: Physical Medicine and Rehabilitation

## 2024-05-28 NOTE — Telephone Encounter (Signed)
 Pt states her pains are past a 10 and need a call back from Megan. Please call pt at [phone].

## 2024-05-30 ENCOUNTER — Ambulatory Visit (HOSPITAL_COMMUNITY)

## 2024-06-02 ENCOUNTER — Telehealth: Payer: Self-pay | Admitting: Physical Medicine and Rehabilitation

## 2024-06-02 ENCOUNTER — Encounter: Admitting: Physical Therapy

## 2024-06-02 NOTE — Telephone Encounter (Signed)
 Patient called and said she haven't heard anything from Megan. She said on [facility] for therapy its been way too long that they didn't have anything. She wants to talk to [facility]. CB#[phone]

## 2024-06-03 ENCOUNTER — Ambulatory Visit: Attending: Physical Medicine and Rehabilitation

## 2024-06-03 DIAGNOSIS — R262 Difficulty in walking, not elsewhere classified: Secondary | ICD-10-CM | POA: Diagnosis present

## 2024-06-03 DIAGNOSIS — M6281 Muscle weakness (generalized): Secondary | ICD-10-CM | POA: Insufficient documentation

## 2024-06-03 DIAGNOSIS — M5459 Other low back pain: Secondary | ICD-10-CM | POA: Insufficient documentation

## 2024-06-03 NOTE — Therapy (Addendum)
 " OUTPATIENT PHYSICAL THERAPY THORACOLUMBAR EVALUATION  PHYSICAL THERAPY UNPLANNED DISCHARGE SUMMARY   Visits from Start of Care: 2  Current functional level related to goals / functional outcomes: Current status unknown   Remaining deficits: Current status unknown   Education / Equipment: Pt has not returned since visit listed below  Patient goals were not assessed. Patient is being discharged due to not returning since the last visit.  (the note below was addended to include the above D/C summary on 07/24/24)  Patient Name: [name] MRN: [id number] DOB:11/30/1960, 63 y.o., female Today's Date: 06/03/2024  END OF SESSION:  PT End of Session - 06/03/24 1706     Visit Number 2    Number of Visits 20    Date for Recertification  07/18/24    PT Start Time 1700    PT Stop Time 1740    PT Time Calculation (min) 40 min    Activity Tolerance Patient tolerated treatment well    Behavior During Therapy [facility] for tasks assessed/performed           Past Medical History:  Diagnosis Date   Anemia    Arthritis    neck, hips (04/25/2018)   Claustrophobia    Daily headache    no longer a problem   GERD (gastroesophageal reflux disease)    Hiatal hernia    Hyperlipidemia    hx (04/25/2018)   Hypertension    Plantar fasciitis    hx - left foot   Pneumonia    Stroke (HCC) 05/2014   mini stroke (04/25/2018)   Past Surgical History:  Procedure Laterality Date   ABDOMINAL HYSTERECTOMY  12/09   Secondary to fibroids   ARTERY BIOPSY Right 04/10/2018   Procedure: BIOPSY TEMPORAL ARTERY;  Surgeon: Oris Krystal FALCON, MD;  Location: [facility] OR;  Service: Vascular;  Laterality: Right;   CESAREAN SECTION  1986; 1988   COLONOSCOPY     CONDYLOMA EXCISION/FULGURATION N/A 11/30/2023   Procedure: REMOVAL, CONDYLOMA;  Surgeon: Teresa Lonni HERO, MD;  Location: [facility] ORS;  Service: General;  Laterality: N/A;  Excision of hypertrophic anal papilla   FOOT SURGERY Left 03/2017   Bone  Spurs Removed   INJECTION, CHEMODENERVATION AGENT N/A 11/30/2023   Procedure: INJECTION, CHEMODENERVATION AGENT;  Surgeon: Teresa Lonni HERO, MD;  Location: [facility];  Service: General;  Laterality: N/A;   LUMBAR LAMINECTOMY/DECOMPRESSION MICRODISCECTOMY N/A 04/13/2021   Procedure: LUMBAR FOUR- LUMBAR FIVE AND LUMBAR FIVE-SACRAL ONE  DECOMPRESSION;  Surgeon: Barbarann Oneil BROCKS, MD;  Location: [facility] OR;  Service: Orthopedics;  Laterality: N/A;   RADIOLOGY WITH ANESTHESIA N/A 04/26/2018   Procedure: MRI WITH ANESTHESIA;  Surgeon: Radiologist, Medication, MD;  Location: [facility] OR;  Service: Radiology;  Laterality: N/A;   RADIOLOGY WITH ANESTHESIA N/A 08/24/2019   Procedure: MRI WITH ANESTHESIA CERVICAL SPINE;  Surgeon: Radiologist, Medication, MD;  Location: [facility] OR;  Service: Radiology;  Laterality: N/A;   RADIOLOGY WITH ANESTHESIA N/A 10/02/2019   Procedure: MRI WITH ANESTHESIA CERVICAL WITHOUT CONTRAST;  Surgeon: Radiologist, Medication, MD;  Location: [facility] OR;  Service: Radiology;  Laterality: N/A;   RECTAL EXAM UNDER ANESTHESIA N/A 11/30/2023   Procedure: EXAM UNDER ANESTHESIA, RECTUM;  Surgeon: Teresa Lonni HERO, MD;  Location: [facility];  Service: General;  Laterality: N/A;   TUBAL LIGATION  1988   Patient Active Problem List   Diagnosis Date Noted   Flu-like symptoms 02/28/2024   Viral illness 02/28/2024   Left hip pain 01/31/2024   Primary osteoarthritis of left hip 01/31/2024  Post-op pain 12/04/2023   Left foot pain 05/17/2023   Cough 04/25/2023   Hypertriglyceridemia 04/20/2023   Encounter for general adult medical examination with abnormal findings 04/20/2023   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   Cough due to ACE inhibitor 05/16/2022   Myalgia 05/16/2022   Hypomagnesemia 03/19/2022   Vitamin B 12 deficiency 03/19/2022   Low TSH level 03/19/2022   Right elbow pain 03/19/2022   Encounter for screening mammogram for malignant neoplasm of breast 03/19/2022   History of hypertension 12/13/2021    History of smoking 12/13/2021   Elevated blood pressure reading 12/13/2021   Contusion of right knee 12/13/2021   Fall 12/13/2021   Muscle cramps 12/04/2021   Elevated liver enzymes 12/04/2021   Paresthesias 12/04/2021   Elevated LFTs 12/04/2021   Anemia 10/20/2021   Chronic foot pain 10/20/2021   Folic acid  deficiency 09/19/2021   Chronic pain syndrome 09/07/2021   Status post lumbar spine surgery for decompression of spinal cord 05/24/2021   Dyspepsia 05/23/2021   History of gastroesophageal reflux (GERD) 05/23/2021   History of lumbar laminectomy for spinal cord decompression 04/26/2021   Lumbar stenosis 04/13/2021   Tension headache 12/22/2020   Low back pain 10/20/2020   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/28/2020   Hypokalemia 12/17/2019   Chronic diarrhea 09/14/2017   History of stroke 06/12/2014   Essential hypertension, benign 01/24/2010   Microcytic anemia 01/11/2010   Hyperlipemia 05/28/2007   Current smoker 05/28/2007   GERD 05/28/2007    PCP: Lendia Boby CROME, NP-C   REFERRING PROVIDER: Trudy Duwaine BRAVO, NP   REFERRING DIAG: IMPRESSION: Postlaminectomy changes at L4-5 and L5-S1. There is no significant residual spinal stenosis. There is stable moderate foraminal narrowing at L4-5 and L5-S1 bilaterally. Correlation for mild radicular symptoms.  Rationale for Evaluation and Treatment: Rehabilitation  THERAPY DIAG:  Other low back pain  Muscle weakness (generalized)  Difficulty in walking, not elsewhere classified  ONSET DATE: years  SUBJECTIVE:                                                                                                                                                                                           SUBJECTIVE STATEMENT: Past back surgery, but didn't do PT. Has been managing (still severe pain) past 3-4 years with injections until they stopped working. Now she is at physical therapy. 10/10 pain right now w/ extreme  difficulty with getting of chair and walking in.   EVAL:  [name] is a 63 y.o. female who comes in today for evaluation of chronic, worsening and severe bilateral lower back pain radiating to left buttock and down  posterior leg to foot. Pain ongoing for several years.   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 No  LIVING ENVIRONMENT: Lives with: lives with their family and lives with an adult companion Lives in: House/apartment Stairs: No Has following equipment at home: Ramped entry  OCCUPATION: custodian at [employer]  PLOF: Independent  PATIENT GOALS: Stop this pain  Next MD Visit:    OBJECTIVE:   DIAGNOSTIC FINDINGS: from 11/10/23 IMPRESSION: Postlaminectomy changes at L4-5 and L5-S1. There is no significant residual spinal stenosis. There is stable moderate foraminal narrowing at L4-5 and L5-S1 bilaterally. Correlation for mild radicular symptoms.  PATIENT SURVEYS:  Patient-Specific Activity Scoring Scheme  0 represents unable to perform. 10 represents able to perform at prior level. 0 1 2 3 4 5 6 7 8 9  10 (Date and Score)   Activity Eval     1. working 3     2. cleaning  3    3. driving 3   4.bending 2   5.    Score 2.75    Total score = sum of the activity scores/number of activities Minimum detectable change (90%CI) for average score = 2 points Minimum detectable change (90%CI) for single activity score = 3 points  SCREENING FOR RED FLAGS: Bowel or bladder incontinence: No Cauda equina syndrome: No  COGNITION: Overall cognitive status: WFL  normal      SENSATION: WFL  POSTURE:  rounded shoulders, forward head, and increased lumbar lordosis  PALPATION: TTP: lumbar paraspinals dn left QL  LUMBAR ROM:   Directional Preference Assessment: Centralization: extension Peripheralization:  flexion  AROM Eval 05/05/24  Flexion 30 c severe pain  Extension 15  Right lateral flexion 30  Left lateral flexion 28  Right rotation Limited 50%  Left rotation Limited 50%   (Blank rows = not tested)  LOWER EXTREMITY ROM:     A= active P= passive Right Eval 05/05/24 Left Eval 05/05/24  Hip flexion A: 102 A: 95  Hip extension    Hip abduction    Hip adduction    Hip internal rotation    Hip external rotation    Knee flexion A: 125 A: 125  Knee extension A: 0 A: 0  Ankle dorsiflexion    Ankle plantarflexion    Ankle inversion    Ankle eversion     (Blank rows = not tested)  LOWER EXTREMITY MMT:    MMT Right Eval 05/05/24 HHD in pounds Left Eval 05/05/24 HHD in pounds  Hip flexion 15.9#, 14.5# 14.3#, 13.7#  Hip extension    Hip abduction    Hip adduction    Hip internal rotation    Hip external rotation    Knee flexion 15.8# 17.0# 13.4# 17.1#  Knee extension 29.3# 25.1# 19.5# 13.7#  Ankle dorsiflexion    Ankle plantarflexion    Ankle inversion    Ankle eversion     (Blank rows = not tested)  LUMBAR SPECIAL TESTS:  05/05/24:  Slump test: Positive on left   FUNCTIONAL TESTS:  05/05/24:  5 times sit to stand: 16.8 seconds with UE support  GAIT: 05/05/24: antalgic gait  TODAY'S TREATMENT:    Neuromuscular Reeducation: *Patient required extra time for exercises due to increased monitoring, reassessment, and rest due to low activity tolerance and/or high irritability of symptoms* *Patient  required extra time for exercises due to additional reeducation on pain science, optimal loading, prognosis, and relevant tissues/anatomy*   Functional reassessment Seated LTR 2x8x3s Seated YTB clamshell 2x4x3s Seated PPT x2x8x3s                                                                                                         DATE: 05/05/24  Therex: HEP instruction/performance c cues for techniques, handout provided.  Trial set performed of each for comprehension and symptom assessment.  See below for exercise list Self Care:  Sleeping positions in side-lying with pillows for support  PATIENT EDUCATION:  Education details: HEP, POC Person educated: Patient Education method: Explanation, Demonstration, Verbal cues, and Handouts Education comprehension: verbalized understanding, returned demonstration, and verbal cues required  HOME EXERCISE PROGRAM: Access Code: [email or web address] URL: [email or web address] Date: 05/05/2024 Prepared by: Delon Lunger  Exercises - Supine Posterior Pelvic Tilt  - 2 x daily - 7 x weekly - 10 reps - 5 seconds hold - Supine Lower Trunk Rotation  - 2 x daily - 7 x weekly - 3 reps - 20-30 seconds hold - Supine Piriformis Stretch with Foot on Ground  - 2 x daily - 7 x weekly - 3 reps - 20-30 seconds hold - Seated Hamstring Stretch  - 2 x daily - 7 x weekly - 3 reps - 20-30 seconds hold - Standing Lumbar Extension at Wall - Forearms  - 2 x daily - 7 x weekly - 10 reps - 5 seconds hold  ASSESSMENT:  CLINICAL IMPRESSION: Patient tolerated treatment with no significant increases in pain with progressions in significant sx modulation and pain levels. Current deficits include: ROM, strength, activity tolerance, and pain. As a result, patient would continue to benefit from skilled PT to address said deficits via plan below. '   Patient is a .63 y.o. who comes to clinic with complaints of low back pain with mobility, strength and movement  coordination deficits that impair their ability to perform usual daily and recreational functional activities without increase difficulty/symptoms at this time.  Patient to benefit from skilled PT services to address impairments and limitations to improve to previous level of function without restriction secondary to condition.   OBJECTIVE IMPAIRMENTS: decreased balance, decreased mobility, difficulty walking, decreased ROM, decreased strength, impaired flexibility, and pain.   ACTIVITY LIMITATIONS: lifting, bending, sitting, standing, squatting, sleeping, stairs, and transfers  PARTICIPATION LIMITATIONS: driving, community activity, and occupation  PERSONAL FACTORS: see PMH above are also affecting patient's functional outcome.   REHAB POTENTIAL: Good  CLINICAL DECISION MAKING: Stable/uncomplicated  EVALUATION COMPLEXITY: Low   GOALS: Goals reviewed with patient? Yes  SHORT TERM GOALS: (target date for Short term goals are 3 weeks 05/26/2024)  1. Patient will demonstrate independent use of home exercise program to maintain progress from in clinic treatments.  Goal status: New  LONG TERM GOALS: (target dates for all  long term goals are 10 weeks  07/18/2024 )   1. Patient will demonstrate/report pain at worst less than or equal to 2/10 to facilitate minimal limitation in daily activity secondary to pain symptoms.  Goal status: New   2. Patient will demonstrate independent use of home exercise program to facilitate ability to maintain/progress functional gains from skilled physical therapy services.  Goal status: New   3. Patient will demonstrate Patient specific functional scale avg > or = 4.75 to indicate reduced disability due to condition.   Goal status: New   4. Pt will improve her trunk flexion to >= 50 degrees in order to improve work skills and functional mobility.   Goal status: New   5.   Pt will be able to perform 5 time sit to stand in </= 12 seconds c/s UE  support.  Goal status: New   6.  Pt will improve her bil LE strength to >/= 10 pounds using HHD for improved gait and functional mobility.  Goal status: New     PLAN:  PT FREQUENCY: 1-2x/week  PT DURATION: 10 weeks  PLANNED INTERVENTIONS: Can include 02853- PT Re-evaluation, 97110-Therapeutic exercises, 97530- Therapeutic activity, [id number]- Neuromuscular re-education, 97535- Self Care, 97140- Manual therapy, [id number]- Gait training, [phone]- Orthotic Fit/training, [phone]- Canalith repositioning, [id number]- Aquatic Therapy, [phone]- Electrical stimulation (unattended), [id number] Physical performance testing, 97016- Vasopneumatic device, [id number]- Ultrasound, [id number]- Traction (mechanical), [id number]- Ionotophoresis 4mg /ml Dexamethasone ,  [id number] - Needle insertion w/o injection 1 or 2 muscles, 20561 - Needle insertion w/o injection 3 or more muscles.    Patient/Family education, Balance training, Stair training, Taping, Dry Needling, Joint mobilization, Joint manipulation, Spinal manipulation, Spinal mobilization, Scar mobilization, Vestibular training, Visual/preceptual remediation/compensation, DME instructions, Cryotherapy, and Moist heat.  All performed as medically necessary.  All included unless contraindicated  PLAN FOR NEXT SESSION: HEP assessment and progression, symptom modulation, and loading (isolated and/or functional). Manual therapy, aerobic, gait, and NME training as needed. Glute and lowback sx modulation.       [place] Odessia Scot  PT, DPT   "

## 2024-06-09 ENCOUNTER — Encounter: Admitting: Physical Therapy

## 2024-06-16 ENCOUNTER — Ambulatory Visit (HOSPITAL_COMMUNITY)
Admission: RE | Admit: 2024-06-16 | Discharge: 2024-06-16 | Disposition: A | Discharge status: Home / Self Care | Source: Ambulatory Visit | Attending: Cardiovascular Disease | Admitting: Cardiovascular Disease

## 2024-06-16 ENCOUNTER — Other Ambulatory Visit (HOSPITAL_COMMUNITY): Payer: Self-pay

## 2024-06-16 DIAGNOSIS — R9431 Abnormal electrocardiogram [ECG] [EKG]: Secondary | ICD-10-CM | POA: Insufficient documentation

## 2024-06-16 LAB — ECHOCARDIOGRAM COMPLETE
Area-P 1/2: 3.5 cm2
S' Lateral: 2.9 cm

## 2024-06-17 ENCOUNTER — Encounter: Payer: Self-pay | Admitting: Family Medicine

## 2024-06-17 DIAGNOSIS — I517 Cardiomegaly: Secondary | ICD-10-CM | POA: Insufficient documentation

## 2024-06-25 ENCOUNTER — Ambulatory Visit

## 2024-06-25 ENCOUNTER — Telehealth: Payer: Self-pay

## 2024-06-25 NOTE — Telephone Encounter (Signed)
 [name]  PT, DPT

## 2024-07-02 ENCOUNTER — Ambulatory Visit

## 2024-07-06 ENCOUNTER — Other Ambulatory Visit: Payer: Self-pay | Admitting: Family Medicine

## 2024-07-08 ENCOUNTER — Ambulatory Visit (HOSPITAL_COMMUNITY)

## 2024-07-09 ENCOUNTER — Ambulatory Visit

## 2024-07-16 ENCOUNTER — Ambulatory Visit

## 2024-07-17 ENCOUNTER — Ambulatory Visit (HOSPITAL_COMMUNITY)
